# Patient Record
Sex: Female | Born: 1950 | Race: White | Hispanic: No | Marital: Single | Smoking: Never smoker
Health system: Southern US, Community
[De-identification: ages and names within clinical notes are randomized; demographics above are authoritative.]

## PROBLEM LIST (undated history)

## (undated) DIAGNOSIS — K589 Irritable bowel syndrome without diarrhea: Secondary | ICD-10-CM

## (undated) DIAGNOSIS — Z972 Presence of dental prosthetic device (complete) (partial): Secondary | ICD-10-CM

## (undated) DIAGNOSIS — I639 Cerebral infarction, unspecified: Secondary | ICD-10-CM

## (undated) DIAGNOSIS — K3184 Gastroparesis: Secondary | ICD-10-CM

## (undated) DIAGNOSIS — M1990 Unspecified osteoarthritis, unspecified site: Secondary | ICD-10-CM

## (undated) DIAGNOSIS — Z993 Dependence on wheelchair: Secondary | ICD-10-CM

## (undated) DIAGNOSIS — R202 Paresthesia of skin: Secondary | ICD-10-CM

## (undated) DIAGNOSIS — Z6841 Body Mass Index (BMI) 40.0 and over, adult: Secondary | ICD-10-CM

## (undated) DIAGNOSIS — J45909 Unspecified asthma, uncomplicated: Secondary | ICD-10-CM

## (undated) DIAGNOSIS — E0821 Diabetes mellitus due to underlying condition with diabetic nephropathy: Secondary | ICD-10-CM

## (undated) DIAGNOSIS — G4730 Sleep apnea, unspecified: Secondary | ICD-10-CM

## (undated) DIAGNOSIS — S8290XA Unspecified fracture of unspecified lower leg, initial encounter for closed fracture: Secondary | ICD-10-CM

## (undated) DIAGNOSIS — R200 Anesthesia of skin: Secondary | ICD-10-CM

## (undated) DIAGNOSIS — Z9689 Presence of other specified functional implants: Secondary | ICD-10-CM

## (undated) DIAGNOSIS — I10 Essential (primary) hypertension: Secondary | ICD-10-CM

## (undated) DIAGNOSIS — R232 Flushing: Secondary | ICD-10-CM

## (undated) DIAGNOSIS — I219 Acute myocardial infarction, unspecified: Secondary | ICD-10-CM

## (undated) DIAGNOSIS — C4490 Unspecified malignant neoplasm of skin, unspecified: Secondary | ICD-10-CM

## (undated) DIAGNOSIS — N182 Chronic kidney disease, stage 2 (mild): Secondary | ICD-10-CM

## (undated) DIAGNOSIS — M545 Low back pain, unspecified: Secondary | ICD-10-CM

## (undated) DIAGNOSIS — N23 Unspecified renal colic: Secondary | ICD-10-CM

## (undated) DIAGNOSIS — M5430 Sciatica, unspecified side: Secondary | ICD-10-CM

## (undated) DIAGNOSIS — M359 Systemic involvement of connective tissue, unspecified: Secondary | ICD-10-CM

## (undated) DIAGNOSIS — E785 Hyperlipidemia, unspecified: Secondary | ICD-10-CM

## (undated) DIAGNOSIS — I2510 Atherosclerotic heart disease of native coronary artery without angina pectoris: Secondary | ICD-10-CM

## (undated) DIAGNOSIS — R600 Localized edema: Secondary | ICD-10-CM

## (undated) DIAGNOSIS — E039 Hypothyroidism, unspecified: Secondary | ICD-10-CM

## (undated) DIAGNOSIS — N200 Calculus of kidney: Secondary | ICD-10-CM

## (undated) DIAGNOSIS — G8929 Other chronic pain: Secondary | ICD-10-CM

## (undated) DIAGNOSIS — N289 Disorder of kidney and ureter, unspecified: Secondary | ICD-10-CM

## (undated) DIAGNOSIS — T8859XA Other complications of anesthesia, initial encounter: Secondary | ICD-10-CM

## (undated) DIAGNOSIS — D649 Anemia, unspecified: Secondary | ICD-10-CM

## (undated) DIAGNOSIS — K7460 Unspecified cirrhosis of liver: Secondary | ICD-10-CM

## (undated) DIAGNOSIS — R110 Nausea: Secondary | ICD-10-CM

## (undated) DIAGNOSIS — N270 Small kidney, unilateral: Secondary | ICD-10-CM

## (undated) DIAGNOSIS — I739 Peripheral vascular disease, unspecified: Secondary | ICD-10-CM

## (undated) DIAGNOSIS — C801 Malignant (primary) neoplasm, unspecified: Secondary | ICD-10-CM

## (undated) DIAGNOSIS — E133299 Other specified diabetes mellitus with mild nonproliferative diabetic retinopathy without macular edema, unspecified eye: Secondary | ICD-10-CM

## (undated) DIAGNOSIS — Z87442 Personal history of urinary calculi: Secondary | ICD-10-CM

## (undated) DIAGNOSIS — T753XXA Motion sickness, initial encounter: Secondary | ICD-10-CM

## (undated) DIAGNOSIS — R310 Gross hematuria: Secondary | ICD-10-CM

## (undated) DIAGNOSIS — E119 Type 2 diabetes mellitus without complications: Secondary | ICD-10-CM

## (undated) DIAGNOSIS — R1012 Left upper quadrant pain: Secondary | ICD-10-CM

## (undated) HISTORY — DX: Nausea: R11.0

## (undated) HISTORY — DX: Calculus of kidney: N20.0

## (undated) HISTORY — PX: CHOLECYSTECTOMY: SHX55

## (undated) HISTORY — DX: Flushing: R23.2

## (undated) HISTORY — PX: ABDOMINAL HYSTERECTOMY: SHX81

## (undated) HISTORY — DX: Gross hematuria: R31.0

## (undated) HISTORY — DX: Cerebral infarction, unspecified: I63.9

## (undated) HISTORY — DX: Unspecified renal colic: N23

## (undated) HISTORY — DX: Left upper quadrant pain: R10.12

## (undated) HISTORY — PX: HERNIA REPAIR: SHX51

## (undated) HISTORY — DX: Unspecified malignant neoplasm of skin, unspecified: C44.90

## (undated) HISTORY — PX: DILATION AND CURETTAGE OF UTERUS: SHX78

## (undated) HISTORY — PX: TOE AMPUTATION: SHX809

## (undated) HISTORY — PX: TONSILLECTOMY: SUR1361

---

## 1984-12-27 HISTORY — PX: EYE SURGERY: SHX253

## 1999-04-27 HISTORY — PX: BACK SURGERY: SHX140

## 2001-05-04 HISTORY — PX: COLONOSCOPY: SHX174

## 2003-03-22 ENCOUNTER — Encounter: Payer: Self-pay | Admitting: Neurosurgery

## 2003-03-26 ENCOUNTER — Encounter: Payer: Self-pay | Admitting: Neurosurgery

## 2003-03-26 ENCOUNTER — Inpatient Hospital Stay (HOSPITAL_COMMUNITY): Admission: RE | Admit: 2003-03-26 | Discharge: 2003-03-27 | Payer: Self-pay | Admitting: Neurosurgery

## 2003-05-21 ENCOUNTER — Encounter: Admission: RE | Admit: 2003-05-21 | Discharge: 2003-05-21 | Payer: Self-pay | Admitting: Neurosurgery

## 2003-05-21 ENCOUNTER — Encounter: Payer: Self-pay | Admitting: Neurosurgery

## 2004-07-27 ENCOUNTER — Other Ambulatory Visit: Payer: Self-pay

## 2004-10-01 ENCOUNTER — Ambulatory Visit: Payer: Self-pay | Admitting: Anesthesiology

## 2004-11-10 ENCOUNTER — Ambulatory Visit: Payer: Self-pay | Admitting: Anesthesiology

## 2004-12-15 ENCOUNTER — Emergency Department: Payer: Self-pay | Admitting: Emergency Medicine

## 2005-01-07 ENCOUNTER — Ambulatory Visit: Payer: Self-pay | Admitting: Unknown Physician Specialty

## 2005-01-29 ENCOUNTER — Ambulatory Visit: Payer: Self-pay | Admitting: Internal Medicine

## 2005-02-05 ENCOUNTER — Ambulatory Visit: Payer: Self-pay | Admitting: Oncology

## 2005-02-24 ENCOUNTER — Ambulatory Visit: Payer: Self-pay | Admitting: Oncology

## 2005-03-03 ENCOUNTER — Ambulatory Visit: Payer: Self-pay | Admitting: Unknown Physician Specialty

## 2005-03-04 ENCOUNTER — Ambulatory Visit: Payer: Self-pay | Admitting: Unknown Physician Specialty

## 2005-06-02 ENCOUNTER — Ambulatory Visit: Payer: Self-pay | Admitting: Anesthesiology

## 2005-07-13 ENCOUNTER — Ambulatory Visit: Payer: Self-pay | Admitting: Anesthesiology

## 2005-08-10 ENCOUNTER — Ambulatory Visit: Payer: Self-pay | Admitting: Anesthesiology

## 2005-09-16 ENCOUNTER — Ambulatory Visit: Payer: Self-pay | Admitting: Internal Medicine

## 2005-09-16 ENCOUNTER — Ambulatory Visit: Payer: Self-pay | Admitting: Anesthesiology

## 2005-10-18 ENCOUNTER — Ambulatory Visit: Payer: Self-pay | Admitting: Anesthesiology

## 2005-11-08 ENCOUNTER — Ambulatory Visit: Payer: Self-pay | Admitting: Anesthesiology

## 2005-12-27 DIAGNOSIS — C801 Malignant (primary) neoplasm, unspecified: Secondary | ICD-10-CM

## 2005-12-27 HISTORY — DX: Malignant (primary) neoplasm, unspecified: C80.1

## 2005-12-30 ENCOUNTER — Ambulatory Visit: Payer: Self-pay | Admitting: Oncology

## 2006-01-12 ENCOUNTER — Ambulatory Visit: Payer: Self-pay | Admitting: Anesthesiology

## 2006-01-27 ENCOUNTER — Ambulatory Visit: Payer: Self-pay | Admitting: Oncology

## 2006-02-07 ENCOUNTER — Ambulatory Visit: Payer: Self-pay | Admitting: Anesthesiology

## 2006-03-22 ENCOUNTER — Ambulatory Visit: Payer: Self-pay | Admitting: Anesthesiology

## 2006-04-21 ENCOUNTER — Ambulatory Visit: Payer: Self-pay | Admitting: Anesthesiology

## 2006-05-30 ENCOUNTER — Ambulatory Visit: Payer: Self-pay | Admitting: Otolaryngology

## 2006-05-30 ENCOUNTER — Other Ambulatory Visit: Payer: Self-pay

## 2006-06-02 ENCOUNTER — Ambulatory Visit: Payer: Self-pay | Admitting: Otolaryngology

## 2006-06-08 ENCOUNTER — Ambulatory Visit: Payer: Self-pay | Admitting: Unknown Physician Specialty

## 2006-07-18 ENCOUNTER — Ambulatory Visit: Payer: Self-pay | Admitting: Orthopaedic Surgery

## 2007-05-04 ENCOUNTER — Ambulatory Visit: Payer: Self-pay | Admitting: Anesthesiology

## 2007-07-03 ENCOUNTER — Ambulatory Visit: Payer: Self-pay | Admitting: Anesthesiology

## 2007-08-16 ENCOUNTER — Ambulatory Visit: Payer: Self-pay | Admitting: Anesthesiology

## 2007-09-19 ENCOUNTER — Ambulatory Visit: Payer: Self-pay | Admitting: Anesthesiology

## 2007-09-26 ENCOUNTER — Ambulatory Visit: Payer: Self-pay | Admitting: Anesthesiology

## 2007-10-19 ENCOUNTER — Ambulatory Visit: Payer: Self-pay | Admitting: Internal Medicine

## 2007-10-26 ENCOUNTER — Ambulatory Visit: Payer: Self-pay | Admitting: Anesthesiology

## 2007-11-20 ENCOUNTER — Ambulatory Visit: Payer: Self-pay | Admitting: Anesthesiology

## 2007-11-29 ENCOUNTER — Ambulatory Visit: Payer: Self-pay | Admitting: Anesthesiology

## 2008-02-21 ENCOUNTER — Ambulatory Visit: Payer: Self-pay | Admitting: Anesthesiology

## 2008-03-08 ENCOUNTER — Ambulatory Visit: Payer: Self-pay | Admitting: Gastroenterology

## 2008-03-14 ENCOUNTER — Ambulatory Visit: Payer: Self-pay | Admitting: Anesthesiology

## 2008-04-03 ENCOUNTER — Ambulatory Visit: Payer: Self-pay | Admitting: Anesthesiology

## 2008-05-06 ENCOUNTER — Ambulatory Visit: Payer: Self-pay | Admitting: Anesthesiology

## 2008-06-05 ENCOUNTER — Ambulatory Visit: Payer: Self-pay | Admitting: Anesthesiology

## 2008-06-13 ENCOUNTER — Ambulatory Visit: Payer: Self-pay | Admitting: Internal Medicine

## 2008-07-01 ENCOUNTER — Ambulatory Visit: Payer: Self-pay | Admitting: Anesthesiology

## 2008-07-04 ENCOUNTER — Ambulatory Visit: Payer: Self-pay | Admitting: Gastroenterology

## 2008-07-09 ENCOUNTER — Ambulatory Visit: Payer: Self-pay | Admitting: Urology

## 2008-07-11 ENCOUNTER — Other Ambulatory Visit: Payer: Self-pay

## 2008-07-12 ENCOUNTER — Observation Stay: Payer: Self-pay | Admitting: Internal Medicine

## 2008-07-29 ENCOUNTER — Ambulatory Visit: Payer: Self-pay | Admitting: Anesthesiology

## 2008-08-29 ENCOUNTER — Ambulatory Visit: Payer: Self-pay | Admitting: Anesthesiology

## 2008-11-05 ENCOUNTER — Ambulatory Visit: Payer: Self-pay | Admitting: Anesthesiology

## 2008-11-25 ENCOUNTER — Ambulatory Visit: Payer: Self-pay | Admitting: Urology

## 2008-11-27 ENCOUNTER — Ambulatory Visit: Payer: Self-pay | Admitting: Anesthesiology

## 2008-12-23 ENCOUNTER — Ambulatory Visit: Payer: Self-pay | Admitting: Urology

## 2009-01-02 ENCOUNTER — Ambulatory Visit: Payer: Self-pay | Admitting: Urology

## 2009-01-13 ENCOUNTER — Ambulatory Visit: Payer: Self-pay | Admitting: Urology

## 2009-04-10 ENCOUNTER — Ambulatory Visit: Payer: Self-pay | Admitting: Urology

## 2009-04-24 ENCOUNTER — Ambulatory Visit: Payer: Self-pay | Admitting: Internal Medicine

## 2009-05-23 ENCOUNTER — Inpatient Hospital Stay (HOSPITAL_COMMUNITY): Admission: RE | Admit: 2009-05-23 | Discharge: 2009-05-26 | Payer: Self-pay | Admitting: Neurosurgery

## 2009-06-04 ENCOUNTER — Ambulatory Visit: Payer: Self-pay | Admitting: Neurosurgery

## 2009-07-02 ENCOUNTER — Emergency Department: Payer: Self-pay | Admitting: Emergency Medicine

## 2009-07-03 ENCOUNTER — Ambulatory Visit (HOSPITAL_COMMUNITY): Admission: RE | Admit: 2009-07-03 | Discharge: 2009-07-05 | Payer: Self-pay | Admitting: Neurological Surgery

## 2009-08-26 ENCOUNTER — Ambulatory Visit: Payer: Self-pay | Admitting: Neurosurgery

## 2009-09-04 ENCOUNTER — Ambulatory Visit: Payer: Self-pay | Admitting: Internal Medicine

## 2009-09-12 ENCOUNTER — Encounter: Payer: Self-pay | Admitting: Neurosurgery

## 2009-09-26 ENCOUNTER — Ambulatory Visit: Payer: Self-pay | Admitting: Oncology

## 2009-09-26 ENCOUNTER — Encounter: Payer: Self-pay | Admitting: Neurosurgery

## 2009-10-15 ENCOUNTER — Ambulatory Visit: Payer: Self-pay | Admitting: Oncology

## 2009-10-27 ENCOUNTER — Ambulatory Visit: Payer: Self-pay | Admitting: Oncology

## 2009-10-28 ENCOUNTER — Encounter: Payer: Self-pay | Admitting: Neurosurgery

## 2009-10-29 ENCOUNTER — Ambulatory Visit: Payer: Self-pay | Admitting: Urology

## 2009-11-12 ENCOUNTER — Ambulatory Visit: Payer: Self-pay | Admitting: Oncology

## 2009-11-26 ENCOUNTER — Ambulatory Visit: Payer: Self-pay | Admitting: Oncology

## 2009-11-27 ENCOUNTER — Ambulatory Visit: Payer: Self-pay | Admitting: Internal Medicine

## 2009-12-24 ENCOUNTER — Ambulatory Visit: Payer: Self-pay | Admitting: Oncology

## 2009-12-26 ENCOUNTER — Emergency Department: Payer: Self-pay | Admitting: Unknown Physician Specialty

## 2009-12-27 ENCOUNTER — Ambulatory Visit: Payer: Self-pay | Admitting: Oncology

## 2010-01-27 ENCOUNTER — Ambulatory Visit: Payer: Self-pay | Admitting: Oncology

## 2010-02-11 ENCOUNTER — Ambulatory Visit: Payer: Self-pay | Admitting: Oncology

## 2010-02-24 ENCOUNTER — Ambulatory Visit: Payer: Self-pay | Admitting: Oncology

## 2010-06-11 ENCOUNTER — Ambulatory Visit: Payer: Self-pay | Admitting: Urology

## 2010-06-24 ENCOUNTER — Ambulatory Visit: Payer: Self-pay | Admitting: Urology

## 2010-12-04 ENCOUNTER — Ambulatory Visit: Payer: Self-pay | Admitting: Urology

## 2010-12-10 ENCOUNTER — Ambulatory Visit: Payer: Self-pay | Admitting: Internal Medicine

## 2011-02-01 ENCOUNTER — Ambulatory Visit: Payer: Self-pay | Admitting: Anesthesiology

## 2011-02-02 ENCOUNTER — Ambulatory Visit: Payer: Self-pay | Admitting: Pain Medicine

## 2011-02-17 ENCOUNTER — Ambulatory Visit: Payer: Self-pay | Admitting: Pain Medicine

## 2011-03-14 ENCOUNTER — Ambulatory Visit: Payer: Self-pay | Admitting: Otolaryngology

## 2011-04-04 LAB — COMPREHENSIVE METABOLIC PANEL
Albumin: 3.6 g/dL (ref 3.5–5.2)
BUN: 9 mg/dL (ref 6–23)
Chloride: 100 mEq/L (ref 96–112)
Creatinine, Ser: 0.73 mg/dL (ref 0.4–1.2)
GFR calc non Af Amer: >60 mL/min (ref >60)
Total Bilirubin: 0.5 mg/dL (ref 0.3–1.2)

## 2011-04-04 LAB — GLUCOSE, CAPILLARY
Glucose-Capillary: 125 mg/dL — ABNORMAL HIGH (ref 70–99)
Glucose-Capillary: 179 mg/dL — ABNORMAL HIGH (ref 70–99)
Glucose-Capillary: 184 mg/dL — ABNORMAL HIGH (ref 70–99)

## 2011-04-04 LAB — URINALYSIS, ROUTINE W REFLEX MICROSCOPIC
Bilirubin Urine: NEGATIVE
Hgb urine dipstick: NEGATIVE
Ketones, ur: NEGATIVE mg/dL
Protein, ur: NEGATIVE mg/dL
Urobilinogen, UA: 0.2 mg/dL (ref 0.0–1.0)

## 2011-04-04 LAB — CBC
HCT: 32.8 % — ABNORMAL LOW (ref 36.0–46.0)
Hemoglobin: 10.6 g/dL — ABNORMAL LOW (ref 12.0–15.0)
MCHC: 32.5 g/dL (ref 30.0–36.0)
MCV: 79.8 fL (ref 78.0–100.0)
Platelets: 353 10*3/uL (ref 150–400)
RBC: 4.1 MIL/uL (ref 3.87–5.11)
RDW: 15 % (ref 11.5–15.5)
WBC: 7.4 10*3/uL (ref 4.0–10.5)

## 2011-04-04 LAB — WOUND CULTURE: Culture: NO GROWTH

## 2011-04-04 LAB — DIFFERENTIAL
Basophils Absolute: 0 10*3/uL (ref 0.0–0.1)
Basophils Relative: 0 % (ref 0–1)
Monocytes Relative: 6 % (ref 3–12)
Neutro Abs: 3.6 10*3/uL (ref 1.7–7.7)
Neutrophils Relative %: 49 % (ref 43–77)

## 2011-04-04 LAB — PROTIME-INR
INR: 1 (ref 0.00–1.49)
Prothrombin Time: 13 seconds (ref 11.6–15.2)

## 2011-04-04 LAB — C-REACTIVE PROTEIN: CRP: 1.1 mg/dL — ABNORMAL HIGH (ref <0.6)

## 2011-04-04 LAB — SEDIMENTATION RATE: Sed Rate: 27 mm/hr — ABNORMAL HIGH (ref 0–22)

## 2011-04-04 LAB — ANAEROBIC CULTURE

## 2011-04-04 LAB — URINE MICROSCOPIC-ADD ON

## 2011-04-06 LAB — CBC
HCT: 35.7 % — ABNORMAL LOW (ref 36.0–46.0)
Hemoglobin: 11.9 g/dL — ABNORMAL LOW (ref 12.0–15.0)
MCHC: 33.3 g/dL (ref 30.0–36.0)
MCV: 83.6 fL (ref 78.0–100.0)
RBC: 4.27 MIL/uL (ref 3.87–5.11)
RDW: 15 % (ref 11.5–15.5)

## 2011-04-06 LAB — GLUCOSE, CAPILLARY
Glucose-Capillary: 188 mg/dL — ABNORMAL HIGH (ref 70–99)
Glucose-Capillary: 226 mg/dL — ABNORMAL HIGH (ref 70–99)
Glucose-Capillary: 256 mg/dL — ABNORMAL HIGH (ref 70–99)
Glucose-Capillary: 271 mg/dL — ABNORMAL HIGH (ref 70–99)
Glucose-Capillary: 271 mg/dL — ABNORMAL HIGH (ref 70–99)

## 2011-04-06 LAB — DIFFERENTIAL
Basophils Absolute: 0.1 10*3/uL (ref 0.0–0.1)
Basophils Relative: 1 % (ref 0–1)
Eosinophils Absolute: 0.7 10*3/uL (ref 0.0–0.7)
Eosinophils Relative: 7 % — ABNORMAL HIGH (ref 0–5)
Monocytes Absolute: 0.6 10*3/uL (ref 0.1–1.0)
Monocytes Relative: 6 % (ref 3–12)
Neutro Abs: 4.9 10*3/uL (ref 1.7–7.7)

## 2011-04-06 LAB — BASIC METABOLIC PANEL
CO2: 27 mEq/L (ref 19–32)
Calcium: 9.8 mg/dL (ref 8.4–10.5)
Chloride: 104 mEq/L (ref 96–112)
GFR calc Af Amer: >60 mL/min (ref >60)
Glucose, Bld: 204 mg/dL — ABNORMAL HIGH (ref 70–99)
Sodium: 141 mEq/L (ref 135–145)

## 2011-04-06 LAB — TYPE AND SCREEN

## 2011-04-13 ENCOUNTER — Ambulatory Visit: Payer: Self-pay | Admitting: Pain Medicine

## 2011-04-15 ENCOUNTER — Ambulatory Visit: Payer: Self-pay | Admitting: Otolaryngology

## 2011-05-11 NOTE — Op Note (Signed)
NAMERICCA, MELGAREJO              ACCOUNT NO.:  [id number]   MEDICAL RECORD NO.:  [id number]          PATIENT TYPE:  OIB   LOCATION:  3534                         FACILITY:  [facility]   PHYSICIAN:  Tia Alert, MD     DATE OF BIRTH:  [birth year]   DATE OF PROCEDURE:  07/03/2009  DATE OF DISCHARGE:                               OPERATIVE REPORT   PREOPERATIVE DIAGNOSIS:  Lumbar wound infection with deep lumbar fluid  collection.   POSTOPERATIVE DIAGNOSIS:  Lumbar wound infection with deep lumbar fluid  collection with deep lumbar wound seroma.   PROCEDURE:  Irrigation and debridement of lumbar wound.   SURGEON:  Tia Alert, MD   ANESTHESIA:  General endotracheal.   COMPLICATIONS:  None apparent.   INDICATIONS FOR PROCEDURE:  Ms. Dedeaux is a 60 year old female who  underwent a lumbar fusion in late May who presented with 7-day of  drainage from her wound.  She had been on Keflex.  She went to the  emergency department last night where she had continued drainage from  the wound.  They started on doxycycline and they did an MRI, which  showed a deep wound fluid collection.  This was felt most likely to be  seromatous fluid.  However, we thought it was best to open the wound and  culture this fluid and decompress the canal as it was causing  significant mass affect on the thecal sac and she was starting to have  tingling in the legs and some complaints of urinary incontinence.  She  understood the risks, benefits, and expected outcome and wished to  proceed.   DESCRIPTION OF PROCEDURE:  This patient was taken to the operating room  and after induction of adequate generalized endotracheal anesthesia, she  was rolled in a prone position on the Wilson frame and all pressure  points were padded.  Her lumbar region was prepped with DuraPrep and  then draped in the usual sterile fashion. A 10 mL of local anesthesia  was injected and her incision was opened.  The small dehisced part  of  the wound was ellipsed out and dissected down to the tissues.  I found  the first superficial seromatous fluid collection and irrigated this and  cleaned this.  I then opened the fascia, dissected down through the  muscular tissues until I found the epidural space with release of a  greenish-colored serosanguineous fluid.  No evidence of infection was  found.  There was no exudate.  No pus.  I irrigated with saline solution  containing bacitracin throughout all bleeding points and then placed a  medium Hemovac drain in the epidural space and one in the subcutaneous  space as I closed.  I closed the muscle and the fascia with 0 Vicryl,  closed the subcutaneous and subcuticular tissue with 2-0 and 3-0 Vicryl,  and  closed the skin with benzoin and Steri-Strips.  The drapes were removed.  A sterile dressing was applied.  The patient was awakened from general  anesthesia and transferred to the recovery room in stable condition.  At  the end of  the procedure, all sponge, needle, and instrument counts were  correct.      Tia Alert, MD  Electronically Signed     DSJ/MEDQ  D:  07/03/2009  T:  07/04/2009  Job:  [id number]

## 2011-05-11 NOTE — Op Note (Signed)
Brittney [name], Brittney [name]              ACCOUNT NO.:  [id number]   MEDICAL RECORD NO.:  [id number]          PATIENT TYPE:  INP   LOCATION:  3014                         FACILITY:  [facility]   PHYSICIAN:  Kathaleen Maser. Pool, M.D.    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   OPERATION:  Bilateral L4-5 decompressive laminectomy with bilateral L4  and L5 decompressive foraminotomies, more than would be required for  simple interbody fusion alone.  L4-5 posterior lumbar interbody fusion  utilizing Tangent interbody allograft wedge, Telamon interbody PEEK  cage, and local autografting.  L4-5 posterolateral arthrodesis utilizing  nonsegmental pedicle fixation and local autografting.   SURGEON:  Kathaleen Maser. Pool, MD   ASSISTANT:  Dr. Yetta Barre.   ANESTHESIA:  General endotracheal.   INDICATIONS:  Brittney [name] is a 60 year old female with history of back  and bilateral lower extremity pain, right greater than left, failed all  conservative management.  Workup demonstrates evidence of degenerative  spondylolisthesis at L4-5 with marked stenosis.  The patient has failed  conservative management.  She presents now for L4-5 decompression and  fusion with instrumentation with hopes of improvement in her symptoms.   OPERATIVE NOTE:  The patient was brought to the operating room and  placed on operating table in supine position.  After an adequate level  of anesthesia was achieved, the patient was placed prone on the Wilson  frame.  Appropriately padded the patient's lumbar region, prepped and  draped sterilely.  A 10 blade was used to make a linear skin incision  overlying the L4-5  interspace.  This was carried down sharply in the  midline.  A subperiosteal dissection was then performed exposing the  lamina and facet joints of L3, 4, and 5.  Deep self-retaining retractor  was placed.  Intraoperative fluoroscopy was used and levels were  confirmed.  Transverse processes of L4 and L5 were also dissected free.  Decompressive laminectomy at L4 and L5 were then performed using Leksell  rongeurs, Kerrison rongeurs, and high-speed drill to remove the entire  lamina of L4, inferior facet of L4 bilaterally, superior facet of L5  bilaterally, and superior aspect of the L5 lamina.  Ligamentum flavum  was elevated and resected in piecemeal fashion.  Underlying thecal sac  and exiting L4 and L5 nerve roots were identified.  Wide decompressive  foraminotomies were performed along the course exiting nerve roots  bilaterally.  Epidural venous plexus was coagulated and cut.  Bilateral  diskectomies were then performed.  Disk spaces were then prepared for an  interbody fusion.  Disk space was distracted up to 10 mm and 10-mm  distractor was left in the patient's right side.  Thecal sac and nerve  roots were protected on the left side.  Disk space was then reamed and  cut with 10-mm Tangent instrument.  Soft tissues were removed from the  interspace.  A 10 x 26 mm Telamon cage packed with morcellized autograft  then packed into place and recessed approximately 2 mm from posterior  cortical margin of L4.  Distractors were removed from the patient's left  side.  Thecal sac and nerve roots were once again protected on the left  side.  Disk space once again reamed and cut with 10 mm Tangent  instrument.  Soft tissue was removed from the interspace.  Disk space  was further curettaged.  Morcellized autograft was then packed in the  interspace.  A 10 x 26 mm Tangent wedge was then packed into interspace  and recessed approximately 2 mm from posterior cortical margin of L4.  Pedicles of L4  and L5 were then identified using surface landmarks and  intraoperative fluoroscopy.  Superficial bone overlying the pedicle was  then removed with the high-speed drill.  Each pedicle was then probed  using pedicle awl.  Pedicle awl track was then tapped with 5.25-mm screw  tap.  Each screw tap hole was then probed and found to be solid with  bone.  The 6.75 x 45 mm radius screws were placed bilaterally at L4.  The 6.75 x 40 mm screws were placed bilaterally at L5.  Transverse  processes of L4 and L5 were then decorticated with high-speed drill.  Morcellized autograft was packed posterolaterally for later fusion.  Short-segment titanium rod was then placed over the screw heads at L4  and L5.  Locking caps were then placed over screw heads.  Locking caps  were then engaged with construct under compression.  Final images  revealed good position of the bone grafts, hardware at proper operative  level with normal alignment of spine.  The wound was then irrigated with  antibiotic solution.  Hemostasis was assured with bipolar  electrocautery.  Gelfoam was placed topically.  Hemostasis was found to  be good.  Medium Hemovac drain was left in the interspace.  The wound  was then closed in layers with Vicryl sutures.  Steri-Strips and sterile  dressing were applied.  There were no complications.  The patient  tolerated the procedure well and she returned to recovery room  postoperatively.           ______________________________  Kathaleen Maser Pool, M.D.     HAP/MEDQ  D:  05/23/2009  T:  05/24/2009  Job:  [id number]

## 2011-05-12 ENCOUNTER — Ambulatory Visit: Payer: Self-pay | Admitting: Pain Medicine

## 2011-05-12 ENCOUNTER — Other Ambulatory Visit: Payer: Self-pay | Admitting: Pain Medicine

## 2011-05-14 NOTE — Discharge Summary (Signed)
Brittney [name], Brittney [name]              ACCOUNT NO.:  [id number]   MEDICAL RECORD NO.:  [id number]          PATIENT TYPE:  INP   LOCATION:  3014                         FACILITY:  [facility]   PHYSICIAN:  Kathaleen Maser. Pool, M.D.    DATE OF BIRTH:  1951-01-15   DATE OF ADMISSION:  05/23/2009  DATE OF DISCHARGE:  05/26/2009                               DISCHARGE SUMMARY   FINAL DIAGNOSIS:  L4-5 degenerative spondylolisthesis with stenosis.   HISTORY OF PRESENT ILLNESS:  Brittney [name] is a 60 year old female with  history of symptomatic lumbar stenosis.  Workup demonstrates evidence of  degenerative spondylolisthesis with resultant stenosis.  The patient  presents now for decompression and fusion in hopes of improving her  symptoms.   HOSPITAL COURSE:  The patient was taken to the operating room where an  uncomplicated L4-5 decompression and fusion with instrumentation was  performed.  Postop the patient awakened with improved lower extremity  function.  Back pain was moderately gradually improved.  She is  mobilized with the aid of physical and occupational therapy.  At the  time of discharge, she is tolerating her diet.  She is ambulating  without assistance and her wound is healing well.  She will be  discharged home.  She will follow up in my office and be seen in  followup in 1 week.           ______________________________  Kathaleen Maser. Pool, M.D.     HAP/MEDQ  D:  07/22/2009  T:  07/23/2009  Job:  [id number]

## 2011-05-14 NOTE — Op Note (Signed)
Brittney [name], Brittney [name]                          ACCOUNT NO.:  [id number]   MEDICAL RECORD NO.:  [id number]                   PATIENT TYPE:  INP   LOCATION:  2870                                 FACILITY:  [facility]   PHYSICIAN:  Kathaleen Maser. Pool, M.D.                 DATE OF BIRTH:  1951-01-11   DATE OF PROCEDURE:  03/26/2003  DATE OF DISCHARGE:                                 OPERATIVE REPORT   PREOPERATIVE DIAGNOSIS:  Left C5-6 herniated nucleus pulposus with  radiculopathy.   POSTOPERATIVE DIAGNOSIS:  Left C5-6 herniated nucleus pulposus with  radiculopathy.   PROCEDURE:  C5-6 anterior cervical diskectomy and fusion with allograft  anterior plating.   SURGEON:  Kathaleen Maser. Pool, M.D.   ASSISTANT:  Donalee Citrin, M.D.   ANESTHESIA:  General endotracheal.   INDICATIONS FOR PROCEDURE:  The patient is a 60 year old female with a  history of neck and left upper extremity pain, paresthesias, and weakness  with a left-sided C6 radiculopathy which has failed conservative management.  MRI scan demonstrates evidence of a large leftward C5-6 disk herniation with  compression of the exiting left-sided C6 nerve root.  We discussed options  of management including the possibility of undergoing a C5-6 anterior  cervical diskectomy with fusion with allograft anterior plating. The patient  is aware of the risks and benefits and wishes to proceed.   DESCRIPTION OF PROCEDURE:  The patient was placed on the operating table in  the supine position. After an adequate level of anesthesia was achieved, the  patient was positioned supine with her neck slightly extended and held in  place with Halter traction.  The patient's anterior cervical region is  prepped and draped sterilely. A 10 blade was used to make a linear skin  incision overlying the C5-6 interspace. This was carried down sharply to the  platysma.  The platysma was then divided vertically.  Dissection then  proceeded along the medial border of  the sternocleidomastoid muscle and  carotid sheath. Trachea and esophagus were mobilized and retracted toward  the left. Prevertebral fascia stripped off the anterior spinal column. The  longus coli muscles were then elevated bilaterally using electrocautery.  Deep self-retaining retractors were placed.  Intraoperative fluoroscopy was  used and the levels were confirmed. The disk space was then incised with a  15 blade and a wide disk space cleanout was then achieved using rongeur  support and backward curets, Kerrison rongeurs, and highspeed drill.  All  elements of the disk were completely removed down to the level of the  posterior annulus. The microscope was brought into the field and used for  microdissection of the remaining disk.  Remaining aspects of osteophytes and  annulus were removed using the highspeed drill down to the level of the  posterior longitudinal ligament. The posterior longitudinal ligament was  then elevated and resected in a piecemeal fashion using Kerrison rongeurs.  The underlying thecal sac was identified.  Wide central decompression was  then performed by using Kerrison rongeurs to undercut the bodies of C5 and  C6. All elements of the disk herniated were resected along the way.  Decompression then proceeded out into the left-sided C6 foramen. C6 nerve  root was identified proximally and a wide anterior foraminotomy was  performed along the course of the exiting nerve root. Decompression then  proceeded into the right-sided C6 foramen.  Once again the C6 nerve root was  identified and widely decompressed anteriorly.  Blunt probe passed easily  both superiorly and inferiorly down each foramen. There was no evidence of  residual stenosis. The wound was then irrigated with antibiotic solution.  Gelfoam was placed topically and hemostasis was found to be good.  An 8 mm  patellar wedge allograft was then impacted in place approximately 1 mm to  the anterior cortical  surface. The 25 mm Atlantis anterior cervical plate  was then placed over the C5 and C6 levels and then attached under  fluoroscopic guidance with 13 mm variable angle screws, two each at C5 and  C6.  All four screws were then tightened and found to be solid within bone.  The locking screws were engaged at both levels. Final images revealed good  position of bone graft and hardware. Proper operative level with normal  alignment of the spine. Retraction system was removed. Hemostasis was  ensured with bipolar electrocautery.  The wound was then irrigated one final  time and then closed in a typical fashion.  Steri-Strips and a sterile  dressing were applied. There were no apparent complications.  The patient  tolerated the procedure well and she returned to the recovery room  postoperatively.                                               Henry A. Pool, M.D.    HAP/MEDQ  D:  03/26/2003  T:  03/26/2003  Job:  [id number]

## 2011-12-08 ENCOUNTER — Ambulatory Visit: Payer: Self-pay | Admitting: Urology

## 2012-03-01 ENCOUNTER — Ambulatory Visit: Payer: Self-pay | Admitting: Internal Medicine

## 2012-03-30 ENCOUNTER — Ambulatory Visit: Payer: Self-pay

## 2012-05-29 ENCOUNTER — Ambulatory Visit: Payer: Self-pay

## 2012-06-26 ENCOUNTER — Ambulatory Visit: Payer: Self-pay

## 2012-07-11 ENCOUNTER — Ambulatory Visit: Payer: Self-pay | Admitting: Internal Medicine

## 2012-08-03 ENCOUNTER — Ambulatory Visit: Payer: Self-pay

## 2012-08-27 ENCOUNTER — Ambulatory Visit: Payer: Self-pay

## 2012-09-26 ENCOUNTER — Ambulatory Visit: Payer: Self-pay

## 2012-12-04 ENCOUNTER — Ambulatory Visit: Payer: Self-pay

## 2012-12-11 DIAGNOSIS — R1012 Left upper quadrant pain: Secondary | ICD-10-CM

## 2012-12-11 DIAGNOSIS — M5430 Sciatica, unspecified side: Secondary | ICD-10-CM | POA: Insufficient documentation

## 2012-12-11 DIAGNOSIS — N270 Small kidney, unilateral: Secondary | ICD-10-CM | POA: Insufficient documentation

## 2012-12-11 DIAGNOSIS — R232 Flushing: Secondary | ICD-10-CM

## 2012-12-11 DIAGNOSIS — R978 Other abnormal tumor markers: Secondary | ICD-10-CM | POA: Insufficient documentation

## 2012-12-11 DIAGNOSIS — N23 Unspecified renal colic: Secondary | ICD-10-CM

## 2012-12-11 DIAGNOSIS — R110 Nausea: Secondary | ICD-10-CM

## 2012-12-11 DIAGNOSIS — N200 Calculus of kidney: Secondary | ICD-10-CM

## 2012-12-11 DIAGNOSIS — N182 Chronic kidney disease, stage 2 (mild): Secondary | ICD-10-CM | POA: Insufficient documentation

## 2012-12-11 DIAGNOSIS — R310 Gross hematuria: Secondary | ICD-10-CM | POA: Insufficient documentation

## 2012-12-11 HISTORY — DX: Unspecified renal colic: N23

## 2012-12-11 HISTORY — DX: Gross hematuria: R31.0

## 2012-12-11 HISTORY — DX: Calculus of kidney: N20.0

## 2012-12-11 HISTORY — DX: Sciatica, unspecified side: M54.30

## 2012-12-11 HISTORY — DX: Left upper quadrant pain: R10.12

## 2012-12-11 HISTORY — DX: Flushing: R23.2

## 2012-12-11 HISTORY — DX: Nausea: R11.0

## 2012-12-27 ENCOUNTER — Ambulatory Visit: Payer: Self-pay

## 2012-12-27 DIAGNOSIS — S8290XA Unspecified fracture of unspecified lower leg, initial encounter for closed fracture: Secondary | ICD-10-CM

## 2012-12-27 HISTORY — DX: Unspecified fracture of unspecified lower leg, initial encounter for closed fracture: S82.90XA

## 2013-03-05 ENCOUNTER — Ambulatory Visit: Payer: Self-pay | Admitting: Internal Medicine

## 2013-05-22 ENCOUNTER — Ambulatory Visit: Payer: Self-pay | Admitting: General Practice

## 2013-05-22 LAB — BASIC METABOLIC PANEL
Anion Gap: 5 — ABNORMAL LOW (ref 7–16)
BUN: 16 mg/dL (ref 7–18)
Calcium, Total: 10 mg/dL (ref 8.5–10.1)
Co2: 32 mmol/L (ref 21–32)
EGFR (African American): >60
Potassium: 3.9 mmol/L (ref 3.5–5.1)
Sodium: 133 mmol/L — ABNORMAL LOW (ref 136–145)

## 2013-05-22 LAB — CBC
HGB: 12.6 g/dL (ref 12.0–16.0)
MCH: 27 pg (ref 26.0–34.0)
MCV: 82 fL (ref 80–100)
WBC: 12.8 10*3/uL — ABNORMAL HIGH (ref 3.6–11.0)

## 2013-05-22 LAB — SEDIMENTATION RATE: Erythrocyte Sed Rate: 29 mm/hr (ref 0–30)

## 2013-05-25 LAB — URINE CULTURE

## 2013-09-11 ENCOUNTER — Ambulatory Visit: Payer: Self-pay | Admitting: General Practice

## 2013-09-11 LAB — MRSA PCR SCREENING

## 2013-09-11 LAB — PROTIME-INR: Prothrombin Time: 13.8 secs (ref 11.5–14.7)

## 2013-09-24 ENCOUNTER — Inpatient Hospital Stay: Payer: Self-pay | Admitting: General Practice

## 2013-09-24 HISTORY — PX: JOINT REPLACEMENT: SHX530

## 2013-09-25 LAB — BASIC METABOLIC PANEL
BUN: 9 mg/dL (ref 7–18)
Calcium, Total: 8.6 mg/dL (ref 8.5–10.1)
EGFR (Non-African Amer.): >60
Osmolality: 276 (ref 275–301)
Sodium: 137 mmol/L (ref 136–145)

## 2013-09-25 LAB — HEMOGLOBIN: HGB: 10.9 g/dL — ABNORMAL LOW (ref 12.0–16.0)

## 2013-09-25 LAB — PATHOLOGY REPORT

## 2013-09-26 DIAGNOSIS — I219 Acute myocardial infarction, unspecified: Secondary | ICD-10-CM

## 2013-09-26 HISTORY — DX: Acute myocardial infarction, unspecified: I21.9

## 2013-09-26 LAB — BASIC METABOLIC PANEL
Calcium, Total: 8.6 mg/dL (ref 8.5–10.1)
Chloride: 102 mmol/L (ref 98–107)
Co2: 29 mmol/L (ref 21–32)
EGFR (African American): >60
Glucose: 172 mg/dL — ABNORMAL HIGH (ref 65–99)
Potassium: 3.6 mmol/L (ref 3.5–5.1)

## 2013-09-26 LAB — HEMOGLOBIN: HGB: 10.8 g/dL — ABNORMAL LOW (ref 12.0–16.0)

## 2013-12-12 ENCOUNTER — Ambulatory Visit: Payer: Self-pay | Admitting: Urology

## 2014-01-24 ENCOUNTER — Ambulatory Visit: Payer: Self-pay | Admitting: Oncology

## 2014-02-01 ENCOUNTER — Ambulatory Visit: Payer: Self-pay | Admitting: Oncology

## 2014-02-04 ENCOUNTER — Ambulatory Visit: Payer: Self-pay | Admitting: Oncology

## 2014-02-04 LAB — RETICULOCYTES
ABSOLUTE RETIC COUNT: 0.0554 10*6/uL (ref 0.019–0.186)
Reticulocyte: 1.14 % (ref 0.4–3.1)

## 2014-02-04 LAB — CBC CANCER CENTER
BASOS ABS: 0.1 x10 3/mm (ref 0.0–0.1)
BASOS PCT: 0.7 %
EOS PCT: 2.3 %
Eosinophil #: 0.3 x10 3/mm (ref 0.0–0.7)
HCT: 39 % (ref 35.0–47.0)
HGB: 12 g/dL (ref 12.0–16.0)
LYMPHS ABS: 4.6 x10 3/mm — AB (ref 1.0–3.6)
Lymphocyte %: 34.6 %
MCH: 24.7 pg — ABNORMAL LOW (ref 26.0–34.0)
MCHC: 30.9 g/dL — ABNORMAL LOW (ref 32.0–36.0)
MCV: 80 fL (ref 80–100)
MONOS PCT: 5.8 %
Monocyte #: 0.8 x10 3/mm (ref 0.2–0.9)
Neutrophil #: 7.6 x10 3/mm — ABNORMAL HIGH (ref 1.4–6.5)
Neutrophil %: 56.6 %
Platelet: 277 x10 3/mm (ref 150–440)
RBC: 4.88 10*6/uL (ref 3.80–5.20)
RDW: 15.7 % — ABNORMAL HIGH (ref 11.5–14.5)
WBC: 13.4 x10 3/mm — ABNORMAL HIGH (ref 3.6–11.0)

## 2014-02-04 LAB — FERRITIN: Ferritin (ARMC): 17 ng/mL (ref 8–388)

## 2014-02-04 LAB — IRON AND TIBC
IRON BIND. CAP.(TOTAL): 441 ug/dL (ref 250–450)
IRON: 55 ug/dL (ref 50–170)
Iron Saturation: 12 %
UNBOUND IRON-BIND. CAP.: 386 ug/dL

## 2014-02-04 LAB — FOLATE: Folic Acid: 22.5 ng/mL (ref 3.1–100.0)

## 2014-02-04 LAB — LACTATE DEHYDROGENASE: LDH: 140 U/L (ref 81–246)

## 2014-02-08 ENCOUNTER — Ambulatory Visit: Payer: Self-pay | Admitting: Gastroenterology

## 2014-02-08 HISTORY — PX: ESOPHAGOGASTRODUODENOSCOPY: SHX1529

## 2014-02-12 LAB — PATHOLOGY REPORT

## 2014-02-24 ENCOUNTER — Ambulatory Visit: Payer: Self-pay | Admitting: Oncology

## 2014-03-06 ENCOUNTER — Ambulatory Visit: Payer: Self-pay | Admitting: Internal Medicine

## 2014-04-01 ENCOUNTER — Ambulatory Visit: Payer: Self-pay | Admitting: Oncology

## 2014-04-01 LAB — CBC CANCER CENTER
BASOS ABS: 0.1 x10 3/mm (ref 0.0–0.1)
BASOS PCT: 1.2 %
Eosinophil #: 0.3 x10 3/mm (ref 0.0–0.7)
Eosinophil %: 3.2 %
HCT: 41.8 % (ref 35.0–47.0)
HGB: 13.2 g/dL (ref 12.0–16.0)
LYMPHS PCT: 33.6 %
Lymphocyte #: 3 x10 3/mm (ref 1.0–3.6)
MCH: 27 pg (ref 26.0–34.0)
MCHC: 31.6 g/dL — AB (ref 32.0–36.0)
MCV: 86 fL (ref 80–100)
Monocyte #: 0.5 x10 3/mm (ref 0.2–0.9)
Monocyte %: 5.1 %
Neutrophil #: 5.1 x10 3/mm (ref 1.4–6.5)
Neutrophil %: 56.9 %
Platelet: 217 x10 3/mm (ref 150–440)
RBC: 4.89 10*6/uL (ref 3.80–5.20)
RDW: 18.9 % — ABNORMAL HIGH (ref 11.5–14.5)
WBC: 9 x10 3/mm (ref 3.6–11.0)

## 2014-04-01 LAB — IRON AND TIBC
IRON BIND. CAP.(TOTAL): 287 ug/dL (ref 250–450)
Iron Saturation: 38 %
Iron: 109 ug/dL (ref 50–170)
UNBOUND IRON-BIND. CAP.: 178 ug/dL

## 2014-04-01 LAB — FERRITIN: Ferritin (ARMC): 209 ng/mL (ref 8–388)

## 2014-04-26 ENCOUNTER — Ambulatory Visit: Payer: Self-pay | Admitting: Oncology

## 2014-04-26 DIAGNOSIS — IMO0002 Reserved for concepts with insufficient information to code with codable children: Secondary | ICD-10-CM | POA: Insufficient documentation

## 2014-04-26 DIAGNOSIS — N200 Calculus of kidney: Secondary | ICD-10-CM | POA: Insufficient documentation

## 2014-04-26 DIAGNOSIS — G4733 Obstructive sleep apnea (adult) (pediatric): Secondary | ICD-10-CM | POA: Insufficient documentation

## 2014-04-26 DIAGNOSIS — E1165 Type 2 diabetes mellitus with hyperglycemia: Secondary | ICD-10-CM

## 2014-04-26 DIAGNOSIS — E785 Hyperlipidemia, unspecified: Secondary | ICD-10-CM | POA: Insufficient documentation

## 2014-04-26 DIAGNOSIS — E1151 Type 2 diabetes mellitus with diabetic peripheral angiopathy without gangrene: Secondary | ICD-10-CM | POA: Insufficient documentation

## 2014-04-26 HISTORY — DX: Calculus of kidney: N20.0

## 2014-05-29 ENCOUNTER — Ambulatory Visit: Payer: Self-pay

## 2014-06-13 ENCOUNTER — Ambulatory Visit: Payer: Self-pay | Admitting: Gastroenterology

## 2014-07-29 ENCOUNTER — Ambulatory Visit: Payer: Self-pay | Admitting: Oncology

## 2014-07-29 LAB — IRON AND TIBC
IRON BIND. CAP.(TOTAL): 294 ug/dL (ref 250–450)
IRON SATURATION: 26 %
IRON: 77 ug/dL (ref 50–170)
Unbound Iron-Bind.Cap.: 217 ug/dL

## 2014-07-29 LAB — CBC CANCER CENTER
BASOS ABS: 0.1 x10 3/mm (ref 0.0–0.1)
BASOS PCT: 1 %
Eosinophil #: 0.2 x10 3/mm (ref 0.0–0.7)
Eosinophil %: 2.6 %
HCT: 39.3 % (ref 35.0–47.0)
HGB: 13 g/dL (ref 12.0–16.0)
LYMPHS ABS: 2.6 x10 3/mm (ref 1.0–3.6)
Lymphocyte %: 28.1 %
MCH: 29.6 pg (ref 26.0–34.0)
MCHC: 33 g/dL (ref 32.0–36.0)
MCV: 90 fL (ref 80–100)
MONOS PCT: 5.4 %
Monocyte #: 0.5 x10 3/mm (ref 0.2–0.9)
NEUTROS PCT: 62.9 %
Neutrophil #: 5.8 x10 3/mm (ref 1.4–6.5)
PLATELETS: 225 x10 3/mm (ref 150–440)
RBC: 4.38 10*6/uL (ref 3.80–5.20)
RDW: 14.3 % (ref 11.5–14.5)
WBC: 9.1 x10 3/mm (ref 3.6–11.0)

## 2014-07-29 LAB — FERRITIN: Ferritin (ARMC): 170 ng/mL (ref 8–388)

## 2014-08-27 ENCOUNTER — Ambulatory Visit: Payer: Self-pay | Admitting: Oncology

## 2014-10-22 ENCOUNTER — Inpatient Hospital Stay: Payer: Self-pay | Admitting: Internal Medicine

## 2014-10-22 LAB — URINALYSIS, COMPLETE
Bacteria: NONE SEEN
Bilirubin,UR: NEGATIVE
Blood: NEGATIVE
Ketone: NEGATIVE
LEUKOCYTE ESTERASE: NEGATIVE
Nitrite: NEGATIVE
Ph: 5 (ref 4.5–8.0)
Protein: NEGATIVE
Specific Gravity: 1.01 (ref 1.003–1.030)
Squamous Epithelial: <1

## 2014-10-22 LAB — COMPREHENSIVE METABOLIC PANEL
ALT: 43 U/L
ANION GAP: 8 (ref 7–16)
AST: 53 U/L — AB (ref 15–37)
Albumin: 3.1 g/dL — ABNORMAL LOW (ref 3.4–5.0)
Alkaline Phosphatase: 124 U/L — ABNORMAL HIGH
BUN: 23 mg/dL — AB (ref 7–18)
Bilirubin,Total: 0.3 mg/dL (ref 0.2–1.0)
CREATININE: 1.11 mg/dL (ref 0.60–1.30)
Calcium, Total: 8.6 mg/dL (ref 8.5–10.1)
Chloride: 100 mmol/L (ref 98–107)
Co2: 31 mmol/L (ref 21–32)
EGFR (African American): >60
EGFR (Non-African Amer.): 53 — ABNORMAL LOW
Glucose: 190 mg/dL — ABNORMAL HIGH (ref 65–99)
Osmolality: 286 (ref 275–301)
Potassium: 3.5 mmol/L (ref 3.5–5.1)
Sodium: 139 mmol/L (ref 136–145)
Total Protein: 7.1 g/dL (ref 6.4–8.2)

## 2014-10-22 LAB — CBC
HCT: 37.7 % (ref 35.0–47.0)
HGB: 12.5 g/dL (ref 12.0–16.0)
MCH: 29.3 pg (ref 26.0–34.0)
MCHC: 33.2 g/dL (ref 32.0–36.0)
MCV: 88 fL (ref 80–100)
Platelet: 216 10*3/uL (ref 150–440)
RBC: 4.27 10*6/uL (ref 3.80–5.20)
RDW: 13.7 % (ref 11.5–14.5)
WBC: 8.9 10*3/uL (ref 3.6–11.0)

## 2014-10-22 LAB — LIPASE, BLOOD: LIPASE: 114 U/L (ref 73–393)

## 2014-10-22 LAB — APTT: Activated PTT: 34.9 secs (ref 23.6–35.9)

## 2014-10-22 LAB — CK TOTAL AND CKMB (NOT AT ARMC)
CK, Total: 188 U/L
CK-MB: 1.4 ng/mL (ref 0.5–3.6)

## 2014-10-22 LAB — TROPONIN I
TROPONIN-I: 0.07 ng/mL — AB
Troponin-I: 0.06 ng/mL — ABNORMAL HIGH

## 2014-10-22 LAB — MAGNESIUM: MAGNESIUM: 1.7 mg/dL — AB

## 2014-10-22 LAB — PROTIME-INR
INR: 1
PROTHROMBIN TIME: 13.2 s (ref 11.5–14.7)

## 2014-10-22 LAB — CK: CK, TOTAL: 221 U/L — AB

## 2014-10-22 LAB — ETHANOL

## 2014-10-23 LAB — TROPONIN I: Troponin-I: 0.06 ng/mL — ABNORMAL HIGH

## 2014-10-23 LAB — TSH: Thyroid Stimulating Horm: 2.8 u[IU]/mL

## 2014-10-23 LAB — CBC WITH DIFFERENTIAL/PLATELET
Basophil #: 0.1 10*3/uL (ref 0.0–0.1)
Basophil %: 0.7 %
EOS PCT: 3.4 %
Eosinophil #: 0.3 10*3/uL (ref 0.0–0.7)
HCT: 38.8 % (ref 35.0–47.0)
HGB: 12.6 g/dL (ref 12.0–16.0)
Lymphocyte #: 2.5 10*3/uL (ref 1.0–3.6)
Lymphocyte %: 26.8 %
MCH: 28.9 pg (ref 26.0–34.0)
MCHC: 32.4 g/dL (ref 32.0–36.0)
MCV: 89 fL (ref 80–100)
Monocyte #: 0.7 x10 3/mm (ref 0.2–0.9)
Monocyte %: 7.4 %
NEUTROS PCT: 61.7 %
Neutrophil #: 5.7 10*3/uL (ref 1.4–6.5)
PLATELETS: 230 10*3/uL (ref 150–440)
RBC: 4.35 10*6/uL (ref 3.80–5.20)
RDW: 14 % (ref 11.5–14.5)
WBC: 9.2 10*3/uL (ref 3.6–11.0)

## 2014-10-23 LAB — CK TOTAL AND CKMB (NOT AT ARMC)
CK, TOTAL: 161 U/L
CK-MB: 1 ng/mL (ref 0.5–3.6)

## 2014-10-23 LAB — BASIC METABOLIC PANEL
Anion Gap: 9 (ref 7–16)
BUN: 17 mg/dL (ref 7–18)
CO2: 29 mmol/L (ref 21–32)
CREATININE: 0.99 mg/dL (ref 0.60–1.30)
Calcium, Total: 8.5 mg/dL (ref 8.5–10.1)
Chloride: 101 mmol/L (ref 98–107)
EGFR (African American): >60
Glucose: 250 mg/dL — ABNORMAL HIGH (ref 65–99)
OSMOLALITY: 288 (ref 275–301)
Potassium: 4.1 mmol/L (ref 3.5–5.1)
SODIUM: 139 mmol/L (ref 136–145)

## 2014-10-23 LAB — MAGNESIUM: MAGNESIUM: 1.8 mg/dL

## 2014-10-23 LAB — LIPID PANEL
Cholesterol: 280 mg/dL — ABNORMAL HIGH (ref 0–200)
HDL: 44 mg/dL (ref 40–60)
Ldl Cholesterol, Calc: 185 mg/dL — ABNORMAL HIGH (ref 0–100)
Triglycerides: 257 mg/dL — ABNORMAL HIGH (ref 0–200)
VLDL CHOLESTEROL, CALC: 51 mg/dL — AB (ref 5–40)

## 2014-10-23 LAB — HEMOGLOBIN A1C: Hemoglobin A1C: 9.3 % — ABNORMAL HIGH (ref 4.2–6.3)

## 2014-10-23 LAB — HEPARIN LEVEL (UNFRACTIONATED): Anti-Xa(Unfractionated): <0.1 IU/mL — ABNORMAL LOW (ref 0.30–0.70)

## 2014-10-24 LAB — CBC WITH DIFFERENTIAL/PLATELET
Basophil #: 0.1 10*3/uL (ref 0.0–0.1)
Basophil %: 0.7 %
EOS ABS: 0.4 10*3/uL (ref 0.0–0.7)
Eosinophil %: 4.2 %
HCT: 37.2 % (ref 35.0–47.0)
HGB: 12.1 g/dL (ref 12.0–16.0)
Lymphocyte #: 2.8 10*3/uL (ref 1.0–3.6)
Lymphocyte %: 28.2 %
MCH: 29.6 pg (ref 26.0–34.0)
MCHC: 32.6 g/dL (ref 32.0–36.0)
MCV: 91 fL (ref 80–100)
MONO ABS: 0.7 x10 3/mm (ref 0.2–0.9)
MONOS PCT: 6.7 %
Neutrophil #: 6 10*3/uL (ref 1.4–6.5)
Neutrophil %: 60.2 %
PLATELETS: 208 10*3/uL (ref 150–440)
RBC: 4.09 10*6/uL (ref 3.80–5.20)
RDW: 14.2 % (ref 11.5–14.5)
WBC: 10 10*3/uL (ref 3.6–11.0)

## 2014-12-10 ENCOUNTER — Ambulatory Visit: Payer: Self-pay | Admitting: Oncology

## 2014-12-10 LAB — CBC CANCER CENTER
BASOS PCT: 0.9 %
Basophil #: 0.1 x10 3/mm (ref 0.0–0.1)
EOS ABS: 0.4 x10 3/mm (ref 0.0–0.7)
EOS PCT: 4.9 %
HCT: 39.5 % (ref 35.0–47.0)
HGB: 12.6 g/dL (ref 12.0–16.0)
LYMPHS ABS: 2.4 x10 3/mm (ref 1.0–3.6)
LYMPHS PCT: 30.6 %
MCH: 28 pg (ref 26.0–34.0)
MCHC: 32 g/dL (ref 32.0–36.0)
MCV: 88 fL (ref 80–100)
MONOS PCT: 4.9 %
Monocyte #: 0.4 x10 3/mm (ref 0.2–0.9)
Neutrophil #: 4.6 x10 3/mm (ref 1.4–6.5)
Neutrophil %: 58.7 %
Platelet: 197 x10 3/mm (ref 150–440)
RBC: 4.51 10*6/uL (ref 3.80–5.20)
RDW: 13.9 % (ref 11.5–14.5)
WBC: 7.8 x10 3/mm (ref 3.6–11.0)

## 2014-12-10 LAB — IRON AND TIBC
IRON: 55 ug/dL (ref 50–170)
Iron Bind.Cap.(Total): 277 ug/dL (ref 250–450)
Iron Saturation: 20 %
Unbound Iron-Bind.Cap.: 222 ug/dL

## 2014-12-10 LAB — FERRITIN: Ferritin (ARMC): 74 ng/mL (ref 8–388)

## 2014-12-19 ENCOUNTER — Ambulatory Visit: Payer: Self-pay | Admitting: Orthopedic Surgery

## 2014-12-27 ENCOUNTER — Ambulatory Visit: Payer: Self-pay | Admitting: Oncology

## 2014-12-27 DIAGNOSIS — Z22322 Carrier or suspected carrier of Methicillin resistant Staphylococcus aureus: Secondary | ICD-10-CM

## 2014-12-27 HISTORY — DX: Carrier or suspected carrier of methicillin resistant Staphylococcus aureus: Z22.322

## 2015-01-24 ENCOUNTER — Ambulatory Visit: Payer: Self-pay | Admitting: Orthopedic Surgery

## 2015-03-11 ENCOUNTER — Ambulatory Visit
Admit: 2015-03-11 | Disposition: A | Discharge status: Home / Self Care | Payer: Self-pay | Attending: Oncology | Admitting: Oncology

## 2015-03-28 ENCOUNTER — Ambulatory Visit
Admit: 2015-03-28 | Disposition: A | Discharge status: Home / Self Care | Payer: Self-pay | Attending: Oncology | Admitting: Oncology

## 2015-04-18 ENCOUNTER — Other Ambulatory Visit: Payer: Self-pay | Admitting: Internal Medicine

## 2015-04-18 DIAGNOSIS — N63 Unspecified lump in unspecified breast: Secondary | ICD-10-CM

## 2015-04-18 NOTE — Discharge Summary (Signed)
PATIENT NAME:  Brittney [name], Brittney [name] MR#:  [id number] DATE OF BIRTH:  10/20/1951  DATE OF ADMISSION:  09/24/2013 DATE OF DISCHARGE: 09/27/2013   ADMITTING DIAGNOSES:  1.  Degenerative arthritis of the left knee.  2.  Seronegative rheumatoid arthritis.  DISCHARGE DIAGNOSES:  1.  Degenerative arthritis of the left knee.  2.  Seronegative rheumatoid arthritis.  PROCEDURE: Right total knee arthroplasty using computer-aided navigation.   SURGEON: Dr. Marry Guan.   ASSISTANT: Vance Peper, PA.   ANESTHESIA: Spinal.   ESTIMATED BLOOD LOSS: 100 mL.  TOURNIQUET TIME: 80 minutes.   DRAINS: Two medium drains to reinfusion system.   IMPLANTS USED: DePuy PFC Sigma size 3 posterior stabilized femoral component that was cemented, size 3 MBT tibial component (cemented), 35 mm 3-peg oval dome patella that was cemented, a 10 mm rotating platform polyethylene insert. Gentamicin cement was used. The patient was stabilized, brought to the recovery room and then brought down to the orthopedic floor.   HISTORY: The patient is a 64 year old female, who presented for upcoming left total knee arthroplasty. The patient had severe pains, had aggravation with weight bearing activities. The patient has been refractory to conservative treatment including cortisone injections, Synvisc, nonsteroidal anti-inflammatories, ambulatory aids and activity modifications.  PHYSICAL EXAMINATION:   GENERAL: A well-developed, well-nourished female with an antalgic gait using a walker with a  valgus thrust to the left knee.  LUNGS: Clear to auscultation.  CARDIAC: Regular rate and rhythm.  MUSCULOSKELETAL: In regard to the left lower extremity, the patient has soft tissue swelling with lateral joint line tenderness. The patient has full extension to 107 degrees and flexion with pain. There was no atrophy noted.   HOSPITAL COURSE: After initial admission on 09/24/2013, the patient was brought to the orthopedic floor. On postoperative day  1, the patient had a hemoglobin of 10.9, which was at 10.8 on postoperative day 2. The patient remained stable there. The patient had normal BMP and was doing fine. The patient worked with physical therapy, initially bed to chair and progressed up to ambulating around the nurse's station. The patient is ready to go home with home health physical therapy.   DISCHARGE INSTRUCTIONS: The patient will follow up with [facility] orthopedics on 10/09/2013. The patient's activity is weight-bear as tolerated. The patient is to use thigh-high ED hose on both legs, removed at bedtime. The patient will elevate heels off the bed. Use incentive spirometer. The patient was encouraged to do cough and deep breathing. The patient will do a regular diet. The patient has a [place] for decreased swelling. The patient will have a dressing change as needed by physical therapy. The patient will call the clinic if there is any bright red bleeding, calf pain, or bowel or bladder difficulty and any fever greater than 101.5. The patient will do home health physical therapy doing gait training and range of motion activities. The patient will do home occupational therapy.   DISCHARGE MEDICATIONS: Resume home medications. The patient will use Lovenox 40 mg subcutaneous once a day for 14 days, then begin 81 mg aspirin once a day after finishing Lovenox. The patient will use oxycodone 1 to 2 tablets every 4 hours as needed for severe pain. Tramadol 50 mg 1 to 2 tablets every 4 hours as needed for mild to moderate pain. Tylenol 1 tablet every 4 hours as needed for less severe pain or fever greater than 100.4.  ____________________________ J. Reche Dixon, [place] [email or web address] D: 09/27/2013 07:31:26 ET T: 09/27/2013 08:01:14 ET  JOB#: [id number]  cc: J. Reche Dixon, [place], <Dictator> J [facility] [address] PA ELECTRONICALLY SIGNED 09/28/2013 7:48

## 2015-04-18 NOTE — Op Note (Signed)
PATIENT NAME:  Brittney [name], Brittney [name] MR#:  [id number] DATE OF BIRTH:  21-Jun-1951  DATE OF PROCEDURE:  09/24/2013  PREOPERATIVE DIAGNOSES:  1.  Degenerative arthrosis of the left knee.  2.  Seronegative rheumatoid arthritis.   POSTOPERATIVE DIAGNOSIS:  1.  Degenerative arthrosis of the left knee.  2.  Seronegative rheumatoid arthritis.  PROCEDURE PERFORMED: Right total knee arthroplasty using computer-assisted navigation.   SURGEON: Laurice Record. Hooten, M.D.   ASSISTANT: Vance Peper, PA, (required to maintain retraction throughout the procedure).   ANESTHESIA: Spinal.   ESTIMATED BLOOD LOSS: 100 mL.   FLUIDS REPLACED: 1700 mL of crystalloid.   TOURNIQUET TIME: 80 minutes.   DRAINS: Two medium drains to reinfusion system.   SOFT TISSUE RELEASES: Anterior cruciate ligament, posterior cruciate ligament, deep medial collateral ligament and patellofemoral ligament.   IMPLANTS UTILIZED: DePuy PFC Sigma size 3 posterior stabilized femoral component (cemented), size 3 MBT tibial component (cemented), 35 mm 3-peg oval dome patella (cemented) and a 10 mm stabilized rotating platform polyethylene insert. Gentamicin cement was utilized.   INDICATIONS FOR SURGERY: The patient is a 64 year old female with history of seronegative rheumatoid arthritis and progressive degenerative changes to the left knee. X-rays demonstrated degenerative changes in tricompartmental fashion. The pain had increased to the point that it was significantly interfering with her activities of daily living. The patient expressed understanding of the risks and benefits of surgical intervention and agreed with plans for total knee arthroplasty.   PROCEDURE IN DETAIL: The patient was brought into the operating room, then and after adequate spinal anesthesia was achieved, a tourniquet was placed on the patient's upper left thigh. The patient's left knee and leg were cleaned and prepped with alcohol and DuraPrep and draped in the usual  sterile fashion. A "timeout" was performed as per usual protocol. The left lower extremity was exsanguinated using an Esmarch and the tourniquet was inflated to 300 mmHg. An anterior longitudinal incision was made followed by a standard mid vastus approach. A small effusion was evacuated. Deep fibers of the medial collateral ligament were elevated in a subperiosteal fashion off the medial flare of the tibia so as to maintain continuous soft tissue sleeve. The patella was subluxed laterally and the patellofemoral ligament was incised. Some synovial tissue was noted to be thickened with areas of hemorrhagic areas. Part of the synovial tissue from the lateral gutter was submitted for pathology. Inspection of the knee demonstrated severe degenerative changes in tricompartmental fashion. Osteophytes were debrided using a rongeur. Anterior and posterior cruciate ligaments were excised. Two 4.0 mm Schanz pins were inserted into the femur and into the tibia for attachment of the ray of trackers used for computer-assisted navigation. Hip center was identified using circumduction technique. Distal landmarks were mapped using the computer. Distal femur and proximal tibia were mapped using the computer. Distal femoral cutting guide was positioned using computer-assisted navigation so as to achieve 5 degree distal valgus cut. Cut was performed and verified using the computer. Distal femur was sized and it was felt that a size 3 femoral component was appropriate. A size 3 cutting guide was positioned and anterior cut was performed and verified using the computer. This was followed by completion of the posterior and chamfer cuts. Femoral cutting guide for central box was then positioned and the central box cut was performed.   Attention was then directed to the proximal tibia. Medial and lateral menisci were excised. The extramedullary tibial cutting guide was positioned using computer-assisted navigation so as to achieve 0  degree varus valgus alignment and 0 degree posterior slope. Cut was performed and verified using the computer. The proximal tibia was sized and it was felt that a size 3 tibial plate was appropriate. Tibial and femoral trials were inserted followed by insertion of a 10 mm polyethylene insert. Excellent mediolateral soft tissue balancing was appreciated both in full extension and in flexion. Finally, the patella was cut and prepared so as to accommodate a 35 mm 3-peg oval dome patella. Patellar trial was placed and placed through range of motion with good patellar tracking noted.   Femoral trial was removed after inspection for any posterior osteophytes. Central post hole for the tibial component was reamed followed by insertion of a keel punch. Tibial tray was then removed. The cut surfaces of bone were irrigated with copious amounts of normal saline with antibiotic solution using pulsatile lavage and then suctioned dry. Polymethyl methacrylate cement with gentamicin was prepared in the usual fashion using a vacuum mixer. Cement was applied to the cut surface the proximal tibia as well as along the undersurface of a size 3 MBT tibial component. The tibial component was positioned and impacted into place. Excess cement was removed using freer elevators. Cement was then applied to the cut surface of the femur as well as along the posterior flanges of a size 3 posterior stabilized femoral component. Femoral component was positioned and impacted into place. Excess cement was removed using freer elevators. A 10 mm polyethylene trial was inserted and the knee was brought in full extension with steady axial compression applied. Finally, cement was applied to the backside of a 35 mm, 3-peg oval dome patella and patellar component was positioned and patellar clamp applied. Excess cement was removed using freer elevators.   After adequate curing of the cement, the tourniquet was deflated after a total tourniquet time of  80 minutes. Hemostasis was achieved using electrocautery. The knee was irrigated with copious amounts of normal saline with antibiotic solution using pulsatile lavage and then suctioned dry. The knee was inspected for any residual cement debris. Exparel 20 mL of 1.3% in 40 mL of normal saline was injected along the posterior recesses, medial and lateral gutters, and along the arthrotomy site. A 10 mm stabilized rotating platform polyethylene insert was inserted and the knee was placed through a range of motion. Excellent mediolateral soft tissue balancing was appreciated and excellent patellar tracking appreciated. Two medium drains were placed in the wound bed and brought out through a separate stab incision to be attached to a reinfusion system. The medial parapatellar portion of the incision was reapproximated using interrupted sutures of #1 Vicryl. The subcutaneous tissue was injected with a total of 30 mL of 0.25% Marcaine with epinephrine. The subcutaneous tissue was approximated in layers using first #0 Vicryl followed by 2-0 Vicryl. The skin was closed with skin staples. Sterile dressing was applied. The patient tolerated the procedure well. She was transported to the recovery room in stable condition.   ____________________________ Laurice Record. Holley Bouche., MD jph:aw D: 09/24/2013 11:34:15 ET T: 09/24/2013 12:00:27 ET JOB#: [id number]  cc: Laurice Record. Holley Bouche., MD, <Dictator> JAMES P Holley Bouche MD ELECTRONICALLY SIGNED 09/29/2013 9:39

## 2015-04-19 NOTE — Discharge Summary (Signed)
PATIENT NAME:  Brittney [name], Brittney [name] MR#:  [id number] DATE OF BIRTH:  09-16-1951  DATE OF ADMISSION:  10/22/2014 DATE OF DISCHARGE:  10/25/2014  DISCHARGE DIAGNOSES: 1.  Syncope, secondary to hypotension. 2.  Right distal fibular fracture.  3.  Bronchitis.  4.  Diabetes mellitus, insulin requiring.  5.  History of uncontrolled hypertension.  6.  Fibromyalgia.  7.  Anxiety/depression.   DISCHARGE MEDICATIONS: 1.  Doxazosin 4 mg at bedtime.  2.  Atenolol 100 mg at bedtime. 3.  Ambien 10 mg at bedtime. 4.  Trazodone 150 mg daily. 5.  Mirapex 0.5 mg 2 tabs at bedtime. 6.  Flonase 2 sprays daily.  7.  Gabapentin 600 mg 5 tabs throughout the day. 8.  Glimepiride 4 mg daily.  9.  Potassium chloride 10 mEq daily.  10.  Janumet 50/500 b.i.d.  11.  Lantus 80 units at bedtime.  12.  Protonix 40 mg daily.  13.  Xanax 0.5 mg t.i.d. and at bedtime. 14.  Flexeril 20 mg at bedtime. 15.  Lexapro 10 mg daily. 16.  Lisinopril 40 mg daily.  17.  Reglan 5 mg at bedtime. 18.  NovoLog 36 units q. p.m., 30 units a.m. and 34 units at lunch. 19.  Torsemide 20 mg daily. 20.  Tramadol 50 mg to 100 mg t.i.d. p.r.n. pain.  21.  Norco 5/325 t.i.d. p.r.n. pain. 22.  Augmentin 875 mg b.i.d. x1 week.   REASON FOR ADMISSION: A 64 year old female who presents with syncope and right fibular fracture. Please see H and P for HPI, past medical history, and physical exam.   HOSPITAL COURSE: The patient was admitted. Blood pressure was 90/40 accounting for her recurrent syncopal spells. It is pretty evident when she is anxious or agitated her blood pressure is up, otherwise, it was quite low. The clonidine patch was held. Her blood pressure came back up. Her meds were re-added, except the patch and she was stable without recurrent syncope. Cardiac enzymes and heart Lexiscan were normal. With some atypical chest pain, which seemed to be more anxiety related, she underwent chest CT scanning, which was normal, showing no PE.  She did have a bronchitis treated with IV Rocephin and switched over to p.o. Augmentin. She was found to have a right fibular fracture. Dr. Marry Guan recommended no weight-bearing for 2 weeks and he will see her back in the office with thoughts of casting her in about a month. Skilled nursing was suggested, but the patient declined and wanted to go home. She had significant edema that she auto-diuresed once she was lying in bed for 24 to 36 hours without any IV Lasix given. Overall prognosis is guarded with her multiple problems.  ____________________________ Rusty Aus, MD mfm:sb D: 10/25/2014 07:20:08 ET T: 10/25/2014 09:23:40 ET JOB#: [id number]  cc: Rusty Aus, MD, <Dictator> Navaeh Kehres Roselee Culver MD ELECTRONICALLY SIGNED 10/26/2014 9:49

## 2015-04-19 NOTE — Consult Note (Signed)
Brief Consult Note: Diagnosis: Right distal fibula fracture (oblique, minimally displaced).   Patient was seen by consultant.   Comments: Patient fell yesterday and had the onset of right lateral ankle pain and was unable to bear weight on the RLE. Radiographs demonstrated fracture as described above. Splint applied in ER. Neurovascular function intact. Maintain NWB. PolarCare and elevation. Do not anticipate the need for surgical intervention. Follow-up in the office in 10-14 days.  Electronic Signatures: Dereck Leep (MD)  (Signed 27-Oct-15 20:19)  Authored: Brief Consult Note   Last Updated: 27-Oct-15 20:19 by Dereck Leep (MD)

## 2015-04-19 NOTE — H&P (Signed)
PATIENT NAME:  Brittney [name], Brittney [name] MR#:  [id number] DATE OF BIRTH:  [birth year]  DATE OF ADMISSION:  10/22/2014  PRIMARY CARE PHYSICIAN: Dr. Emily Filbert.  CHIEF COMPLAINT: Chest pain and syncope multiple times. The patient is alert, awake, oriented, in no acute distress. The patient said she has been feeling weak for the past few weeks. In addition, patient has intermittent chest pain on the right side, radiation to the neck. She had some nausea but denies any abdominal pain, vomiting, or diarrhea. No palpitations, orthopnea, nocturnal dyspnea. The patient said she has chronic lymphedema of bilateral lower extremities. In addition, the patient said that she passed out 5 times for the past 1 week. The patient denies any symptoms before passing out. She denies any headache, dizziness, incontinence. Passing out is about several minutes. The patient said the loss of consciousness was about several minutes. The patient denies any other symptoms.   PAST MEDICAL HISTORY: Hypertension, diabetes, hyperlipidemia, sleep apnea, Crohn disease, nephrolithiasis, rheumatoid arthritis, fatty liver, asthma.   PAST SURGICAL HISTORY: D and C, cholecystectomy, hysterectomy, left knee replacement, back surgery, cholecystectomy.   SOCIAL HISTORY: Denies any smoking or drinking or illicit drugs.   FAMILY HISTORY: Hypertension, diabetes. Mother has a heart attack.   ALLERGIES: IMDUR, NORVASC, TAPE, MSG, SHELLFISH.   HOME MEDICATIONS:  1. Alprazolam 0.5 mg p.o. t.i.d. 2. Alprazolam 0.5 mg 2 tablets once a day at bedtime. 3. Atenolol 100 mg p.o. daily. 4. Catapres-TTS-2 at 0.2 mg per 24 hours 1 patch transdermal once a week. 5. Cyclobenzaprine 10 mg p.o. 2 tablets a day at bedtime. 6. Doxazosin 4 mg p.o. bedtime. 7. Escitalopram 10 mg 1 tablet once a day. 8. Fluticasone 0.05 mg in each nasal spray 2 sprays once a day at bedtime. 9. Gabapentin 600 mg 5 tablets once a day at bedtime. 10. Glimepiride 4 mg p.o. daily. 11.  Ibuprofen 800 mg b.i.d. 12. Janumet 5 mg/500 mg p.o. b.i.d. p.r.n. for high blood sugar. 13. Klor-Con 10 mEq p.o. daily. 14. Lantus 80 units subcutaneous at bedtime. 15. Levaquin 5% topical film apply topically to affected area once a day p.r.n. 16. Lisinopril 40 mg p.o. once a day. 17. Metoclopramide 5 mg p.o. at bedtime. 18. NovoLog 30 units subcutaneous in the morning 35 subcutaneous at lunch time, 36 in the evening. 19. Paxil 0.5 mg 2 tablets once a day at bedtime. 20. Protonix 40 mg p.o. daily. 21. Lasix 20 mg p.o. daily. 22. Tramadol 50 mg 2 tablets 3 times a day p.r.n. 23. Trazodone 150 mg p.o. at bedtime. 24. Ventolin HFA 90 mcg inhalation 2 puffs 4 times a day p.r.n. 25. Zolpidem 10 mg p.o. at bedtime.   REVIEW OF SYSTEMS:  CONSTITUTIONAL: The patient denies any fever or chills. Sometimes headache, dizziness, and generalized weakness.  EYES: No double vision, no blurry vision.  ENT: No postnasal drip, slurred speech, or dysphagia.  CARDIOVASCULAR: Positive for chest pain. No palpitations, orthopnea, nocturnal dyspnea, but has leg edema.  PULMONARY: No cough, sputum, shortness of breath or hemoptysis.  GASTROINTESTINAL: No abdominal pain, nausea, vomiting, or diarrhea. No melena or bloody stool.  GENITOURINARY: No dysuria, hematuria, or incontinence.  SKIN: No rash or jaundice.  NEUROLOGY: Positive for syncope, loss of consciousness, but no seizure.  ENDOCRINE: No polyuria, polydipsia, heat or cold intolerance.  HEMATOLOGIC: No easy bruising or bleeding.   PHYSICAL EXAMINATION:  VITAL SIGNS: Temperature 98.6, blood pressure is 136/64, pulse 77, oxygen saturation 98% on room air.  GENERAL: The patient is  alert, awake, oriented, in no acute distress.  HEENT: Pupils round, equal and reactive to light and accommodation. Moist oral mucosa. Clear oropharynx.  NECK: Supple. No JVD or carotid bruit. No lymphadenopathy. No thyromegaly.  CARDIOVASCULAR: S1, S2 regular rate and rhythm.  No murmurs or gallops.  PULMONARY: Bilateral air entry. No wheezing or rales. No use of accessory muscle to breathe.  ABDOMEN: Soft. No distention or tenderness. No organomegaly. Bowel sounds present. Obese.  EXTREMITIES: Bilateral lower extremities edema. No clubbing or cyanosis. No calf tenderness. Left lower extremity: Mild erythema. The right lower extremity: Tenderness and swelling.  NEUROLOGY: A and O x 3. No focal deficit. Power 4/5. Sensation intact.   LABORATORY DATA: Right ankle x-ray showed oblique fracture of the right distal fibula diaphysis. Chest x-ray: No acute abnormality. CAT scan of head stable diffuse slight atrophy.  Glucose 190, BUN 23, creatinine 1.11, electrolyte normal, AST 53, albumin 3.1. ETOH  is less than 3. CBC is in normal range. Magnesium 1.7. Troponin 0.07.   IMPRESSIONS:  1. Non-ST segment elevation myocardial infarction. 2. Syncope.  3. Hypertension.  4. Diabetes.  5. Hypomagnesemia.  6. Syncope.  7. Right distal fibular diaphysis fracture.  8. Hypomagnesemia.  9. Obstructive sleep apnea. 10. Obesity.  11. Dehydration.  PLAN OF TREATMENT:  1. The patient will be admitted to telemetry floor. Dr. Karma Greaser, ED physician, started heparin drip. We will continue heparin drip and start aspirin 325 mg p.o. daily with a statin.  2. We will get a followup troponin level, get a cardiology consult from [facility] cardiologist.  3. For syncope. We will get a carotid duplex and follow up cardiology recommendations.  4. For hypomagnesemia, will give magnesium and follow up level.  5. For right fibular fracture, with get a consult from Dr. Marry Guan.  6. For diabetes, we will start a sliding scale. Continue Lantus and NovoLog t.i.d.  7. For dehydration, we will give IV fluid support. Follow up BMP.  8. I discussed the patient's condition and plan of treatment with the patient and the patient's daughter.  CODE STATUS: The patient wants full code.   TIME SPENT: About 63  minutes.     ____________________________ Demetrios Loll, MD qc:lt D: 10/22/2014 18:29:11 ET T: 10/22/2014 19:42:30 ET JOB#: [id number]  cc: Demetrios Loll, MD, <Dictator> Demetrios Loll MD ELECTRONICALLY SIGNED 10/23/2014 16:00

## 2015-04-19 NOTE — Consult Note (Signed)
PATIENT NAME:  Brittney [name], Brittney [name] MR#:  [id number] DATE OF BIRTH:  05-28-1951  DATE OF CONSULTATION:  10/23/2014  CONSULTING PHYSICIAN:  Dwayne D. Callwood, MD  CHIEF COMPLAINT: Abnormal EKG, syncope with chest pain.   PRIMARY PHYSICIAN: Dr. Emily Filbert.  HISTORY OF PRESENT ILLNESS: The patient is a 64 year old with chest pain and syncope. The patient complains of nausea and right-sided chest pain radiating to her neck. No palpitations or tachycardia. The patient has had multiple episodes of falling, ended up fracturing her right foot and then was brought to the Emergency Room. The patient has had loss of consciousness for several times, unclear etiology, now presenting for evaluation.   PAST MEDICAL HISTORY: Hypertension, diabetes, hyperlipidemia, sleep apnea, Crohn disease, nephrolithiasis, rheumatoid arthritis, fatty liver, asthma.   PAST SURGICAL HISTORY: D and C, cholecystectomy, hysterectomy, left knee replacement, back surgery, cholecystectomy.   SOCIAL HISTORY: No smoking or alcohol consumption.   FAMILY HISTORY: Hypertension, diabetes, heart attack.    ALLERGIES: IMDUR, NORVASC, TAPE, MSG, SHELLFISH.   HOME MEDICATIONS: Alprazolam 0.5 t.i.d., atenolol 100 mg a day, Catapres 0.2 once a day, Flexeril 10 mg 2 at bedtime, doxazosin 1 mg at bedtime, escitalopram 10 mg once a day, fluticasone 0.05 one spray to each nostril at bedtime, gabapentin 600 five tablets a day at bedtime, glimepiride 4 mg a day, ibuprofen 800 mg 3 times a day, Janumet 5/500 twice a day, Klor-Con 10 mEq a day, Lantus 80 units subcutaneous at bedtime, Levaquin topical film to affected area, lisinopril 10 mg a day, metoclopramide 5 mg at bedtime, NovoLog 35 units at bedtime, 30 units in the morning, Paxil 0.5 two tablets once a day, Protonix 40 a day, Lasix 20 a day, tramadol 50 two tablets 3 times a day, trazodone 150 at bedtime, Ventolin as needed, Zyloprim 10 mg at bedtime.   REVIEW OF SYSTEMS: Positive blackout  spells, syncope. She has had some nausea but no vomiting. No fever. No chills. No sweats. No weight loss. No weight gain, hemoptysis, hematemesis. Denies bright red blood per rectum. No vision change or hearing change. Denies sputum production or cough.   PHYSICAL EXAMINATION:  VITAL SIGNS: Blood pressure 140/60, pulse of 75, respiratory rate of 16, afebrile.   HEENT: Normocephalic, atraumatic. Pupils equal, reactive to light.  NECK: Supple. No JVD, bruits, or adenopathy.  LUNGS: Clear to auscultation and percussion.  HEART: Regular rate and rhythm.  NEUROLOGIC: Intact.  SKIN: Normal.   LABORATORY DATA: Right ankle x-ray shows fracture. Chest x-ray negative. CT of the head negative, diffuse atrophy.   Glucose 190, BUN of 23, creatinine 1.11, AST 53, albumin 3.1. CBC normal. Magnesium 1.7. Troponin 0.07.  IMPRESSION: Possible non-Q-wave myocardial infarction, syncope, hypertension, diabetes, hypoglycemia, right fibular fracture, hypomagnesemia, hypertension, diabetes, obstructive sleep apnea, obesity, dehydration.   PLAN: 1. I recommend rule out for myocardial infarction. Follow up cardiac enzymes. Follow up EKGs. Recommend continue current therapy. Recommend functional study for further evaluation, probably [facility] or Myoview. 2. For syncope recommend further evaluation. 3.  For diabetes, continue sliding scale, continue Lantus and NovoLog.  4.  For dehydration,  support for BMP.  5. For obstructive sleep apnea, recommend sinus. Recommend treatment with CPAP and therapy.  6. For hypomagnesemia, recommend followup. May replace magnesium. 7. Hypertension. Continue current treatment and therapy. Will follow up echocardiogram, Myoview. Base further evaluation on the results of these studies.  8. Deep vein thrombosis prophylaxis in the interim.  9. Would recommend Xanax therapy. 10. Continue inhalers as necessary.  11. Continue GERD therapy. 12. Protonix therapy for necessary. 13. Continue  current therapy and base further evaluation on the results of further studies.    ____________________________ Loran Senters Clayborn Bigness, MD ddc:lt D: 10/23/2014 12:19:35 ET T: 10/23/2014 14:27:16 ET JOB#: [id number]  cc: Dwayne D. Clayborn Bigness, MD, <Dictator> Yolonda Kida MD ELECTRONICALLY SIGNED 11/25/2014 9:56

## 2015-04-22 ENCOUNTER — Other Ambulatory Visit: Payer: Self-pay | Admitting: Internal Medicine

## 2015-04-22 DIAGNOSIS — N6310 Unspecified lump in the right breast, unspecified quadrant: Secondary | ICD-10-CM

## 2015-04-30 ENCOUNTER — Ambulatory Visit: Payer: Self-pay

## 2015-04-30 ENCOUNTER — Ambulatory Visit
Admission: RE | Admit: 2015-04-30 | Discharge: 2015-04-30 | Disposition: A | Discharge status: Home / Self Care | Payer: Medicare Other | Source: Ambulatory Visit | Attending: Internal Medicine | Admitting: Internal Medicine

## 2015-04-30 ENCOUNTER — Other Ambulatory Visit: Payer: Self-pay | Admitting: Internal Medicine

## 2015-04-30 DIAGNOSIS — N6310 Unspecified lump in the right breast, unspecified quadrant: Secondary | ICD-10-CM

## 2015-04-30 DIAGNOSIS — N63 Unspecified lump in breast: Secondary | ICD-10-CM | POA: Insufficient documentation

## 2015-04-30 HISTORY — DX: Malignant (primary) neoplasm, unspecified: C80.1

## 2016-01-27 DIAGNOSIS — I10 Essential (primary) hypertension: Secondary | ICD-10-CM | POA: Insufficient documentation

## 2016-02-20 DIAGNOSIS — R809 Proteinuria, unspecified: Secondary | ICD-10-CM | POA: Insufficient documentation

## 2016-03-16 DIAGNOSIS — Z6841 Body Mass Index (BMI) 40.0 and over, adult: Secondary | ICD-10-CM

## 2016-03-16 DIAGNOSIS — Z794 Long term (current) use of insulin: Secondary | ICD-10-CM | POA: Insufficient documentation

## 2016-03-16 DIAGNOSIS — E1121 Type 2 diabetes mellitus with diabetic nephropathy: Secondary | ICD-10-CM | POA: Insufficient documentation

## 2016-03-16 DIAGNOSIS — E1151 Type 2 diabetes mellitus with diabetic peripheral angiopathy without gangrene: Secondary | ICD-10-CM | POA: Insufficient documentation

## 2016-03-16 DIAGNOSIS — E113293 Type 2 diabetes mellitus with mild nonproliferative diabetic retinopathy without macular edema, bilateral: Secondary | ICD-10-CM | POA: Insufficient documentation

## 2016-04-02 ENCOUNTER — Other Ambulatory Visit: Payer: Self-pay | Admitting: Internal Medicine

## 2016-04-02 DIAGNOSIS — Z1231 Encounter for screening mammogram for malignant neoplasm of breast: Secondary | ICD-10-CM

## 2016-04-15 DIAGNOSIS — M797 Fibromyalgia: Secondary | ICD-10-CM | POA: Insufficient documentation

## 2016-04-15 DIAGNOSIS — M1380 Other specified arthritis, unspecified site: Secondary | ICD-10-CM | POA: Insufficient documentation

## 2016-05-03 ENCOUNTER — Ambulatory Visit
Admission: RE | Admit: 2016-05-03 | Discharge: 2016-05-03 | Disposition: A | Discharge status: Home / Self Care | Payer: Medicare Other | Source: Ambulatory Visit | Attending: Internal Medicine | Admitting: Internal Medicine

## 2016-05-03 ENCOUNTER — Other Ambulatory Visit: Payer: Self-pay | Admitting: Internal Medicine

## 2016-05-03 DIAGNOSIS — Z1231 Encounter for screening mammogram for malignant neoplasm of breast: Secondary | ICD-10-CM

## 2016-05-27 ENCOUNTER — Other Ambulatory Visit: Payer: Self-pay | Admitting: Gastroenterology

## 2016-05-27 DIAGNOSIS — R1084 Generalized abdominal pain: Secondary | ICD-10-CM

## 2016-06-03 ENCOUNTER — Ambulatory Visit
Admission: RE | Admit: 2016-06-03 | Discharge: 2016-06-03 | Disposition: A | Discharge status: Home / Self Care | Payer: Medicare Other | Source: Ambulatory Visit | Attending: Gastroenterology | Admitting: Gastroenterology

## 2016-06-03 DIAGNOSIS — K5730 Diverticulosis of large intestine without perforation or abscess without bleeding: Secondary | ICD-10-CM | POA: Diagnosis not present

## 2016-06-03 DIAGNOSIS — R1084 Generalized abdominal pain: Secondary | ICD-10-CM

## 2016-06-03 DIAGNOSIS — R590 Localized enlarged lymph nodes: Secondary | ICD-10-CM | POA: Diagnosis not present

## 2016-06-03 DIAGNOSIS — I2510 Atherosclerotic heart disease of native coronary artery without angina pectoris: Secondary | ICD-10-CM | POA: Insufficient documentation

## 2016-06-03 DIAGNOSIS — I517 Cardiomegaly: Secondary | ICD-10-CM | POA: Insufficient documentation

## 2016-06-03 DIAGNOSIS — N200 Calculus of kidney: Secondary | ICD-10-CM | POA: Diagnosis not present

## 2016-06-03 HISTORY — DX: Essential (primary) hypertension: I10

## 2016-06-03 HISTORY — DX: Systemic involvement of connective tissue, unspecified: M35.9

## 2016-06-03 HISTORY — DX: Unspecified asthma, uncomplicated: J45.909

## 2016-06-03 HISTORY — DX: Type 2 diabetes mellitus without complications: E11.9

## 2016-06-03 LAB — POCT I-STAT CREATININE: Creatinine, Ser: 0.7 mg/dL (ref 0.44–1.00)

## 2016-06-03 MED ORDER — IOPAMIDOL (ISOVUE-300) INJECTION 61%
100.0000 mL | Freq: Once | INTRAVENOUS | Status: AC | PRN
  Administered 2016-06-03: 100 mL via INTRAVENOUS

## 2016-06-14 ENCOUNTER — Other Ambulatory Visit: Payer: Self-pay | Admitting: Gastroenterology

## 2016-06-14 DIAGNOSIS — K7460 Unspecified cirrhosis of liver: Secondary | ICD-10-CM

## 2016-06-17 ENCOUNTER — Ambulatory Visit
Admission: RE | Admit: 2016-06-17 | Discharge: 2016-06-17 | Disposition: A | Discharge status: Home / Self Care | Payer: Medicare Other | Source: Ambulatory Visit | Attending: Gastroenterology | Admitting: Gastroenterology

## 2016-06-17 DIAGNOSIS — R932 Abnormal findings on diagnostic imaging of liver and biliary tract: Secondary | ICD-10-CM | POA: Insufficient documentation

## 2016-06-17 DIAGNOSIS — K7460 Unspecified cirrhosis of liver: Secondary | ICD-10-CM | POA: Diagnosis not present

## 2016-06-20 DIAGNOSIS — K7581 Nonalcoholic steatohepatitis (NASH): Secondary | ICD-10-CM | POA: Insufficient documentation

## 2016-06-20 DIAGNOSIS — K7460 Unspecified cirrhosis of liver: Secondary | ICD-10-CM | POA: Insufficient documentation

## 2016-08-02 ENCOUNTER — Encounter: Payer: Self-pay | Admitting: Dietician

## 2016-08-02 ENCOUNTER — Encounter: Payer: Medicare Other | Attending: Gastroenterology | Admitting: Dietician

## 2016-08-02 VITALS — Ht 66.0 in | Wt 270.0 lb

## 2016-08-02 DIAGNOSIS — K7460 Unspecified cirrhosis of liver: Secondary | ICD-10-CM | POA: Diagnosis not present

## 2016-08-02 DIAGNOSIS — M1990 Unspecified osteoarthritis, unspecified site: Secondary | ICD-10-CM | POA: Diagnosis not present

## 2016-08-02 DIAGNOSIS — H3500 Unspecified background retinopathy: Secondary | ICD-10-CM | POA: Insufficient documentation

## 2016-08-02 DIAGNOSIS — E785 Hyperlipidemia, unspecified: Secondary | ICD-10-CM | POA: Insufficient documentation

## 2016-08-02 DIAGNOSIS — N289 Disorder of kidney and ureter, unspecified: Secondary | ICD-10-CM | POA: Insufficient documentation

## 2016-08-02 DIAGNOSIS — E669 Obesity, unspecified: Secondary | ICD-10-CM | POA: Diagnosis not present

## 2016-08-02 DIAGNOSIS — E119 Type 2 diabetes mellitus without complications: Secondary | ICD-10-CM | POA: Insufficient documentation

## 2016-08-02 DIAGNOSIS — K3184 Gastroparesis: Secondary | ICD-10-CM | POA: Diagnosis not present

## 2016-08-02 DIAGNOSIS — Z794 Long term (current) use of insulin: Secondary | ICD-10-CM

## 2016-08-02 DIAGNOSIS — K760 Fatty (change of) liver, not elsewhere classified: Secondary | ICD-10-CM | POA: Insufficient documentation

## 2016-08-02 DIAGNOSIS — I10 Essential (primary) hypertension: Secondary | ICD-10-CM | POA: Insufficient documentation

## 2016-08-02 DIAGNOSIS — Z713 Dietary counseling and surveillance: Secondary | ICD-10-CM | POA: Diagnosis present

## 2016-08-02 DIAGNOSIS — K769 Liver disease, unspecified: Secondary | ICD-10-CM

## 2016-08-02 DIAGNOSIS — E1140 Type 2 diabetes mellitus with diabetic neuropathy, unspecified: Secondary | ICD-10-CM

## 2016-08-02 NOTE — Progress Notes (Signed)
Medical Nutrition Therapy: Visit start time:1340  end time: 1510 Assessment:  Diagnosis: Type 2 diabetes, obesity, fatty liver with cirrhosis Past medical history: hyperlipidemia, hypertension, gastroparesis, retinopathy, nephropathy, osteoarthritis Psychosocial issues/ stress concerns: Patient rates her stress as "high" and indicates "not so well" as to how well she is dealing with her stress Preferred learning method:  . No preference indicated  Current weight: 270 lbs  Height: 66 in Medications, supplements: see list Progress and evaluation:  Patient in for initial medical nutrition therapy assessment. She states that her primary diet concerns relate to gastroparesis, diverticulitis and diabetes. She states she is presently on Augmentin due to episode of diverticulitis last week.  She reports that her last HgA1c was in May and was 7.5 but reports her blood sugars have since increased due to cortisone injections for back pain as well as diverticulitis. She reports glucose readings in the 200's-300's. She reports that her meats are mainly chicken and occasional hamburger patties and reports food allergies to fish and eggs. She eats peanut butter frequently.  She does not add salt in cooking or at the table. Her diet is low in fruits and vegetables.  She reports that she cannot afford many of the foods recommended.   Physical activity: unable to exercise due to back pain and overall joint pain.  Dietary Intake:  Usual eating pattern includes 3 meals and 1-2 snacks per day. Dining out frequency: very rarely  Breakfast: 8:00am- peanut butter/diet jelly, 2% milk Lunch: 1:00pm- ham/cheese sandwich, water Snack: dry cereal Supper: peanut butter/jelly sandwich or chicken thigh or hamburger pattie with potatoes, occas. Green beans. Beverages: water, milk  Nutrition Care Education:  Diabetes:  Instructed on a meal plan based on 1500 calories to promote weight loss  including a review of  carbohydrate sources and serving sizes and the need to balance meals with lean protein, 2-3 servings of carbohydrate and soft cooked non-starchy vegetables. Encouraged a canned or soft cooked fruit at lunch and dinner as part of the carbohydrate. Gave and reviewed menus. Gastroparesis: Gave and reviewed foods foods allowed and those to avoid in order to limit fiber from vegetables, fruits and whole grains and fibrous meats. Encouraged to include vegetables and fruits allowed. Discussed how low fiber diet will also help while healing from diverticulitis. Liver Disease: In addition to above diet principles, encouraged to limit sodium intake with no more than 2000 mg sodium daily.  Nutritional Diagnosis:  NI-5.11.1 Predicted suboptimal nutrient intake As related to very low intake of fruits and vegetables.  As evidenced by diet history..  Intervention:  Balance meals with a protein food (soft cooked chicken, low-fat cheese, peanut butter), 1-2 starch servings (mashed potatoes, soft cooked sweet potatoes, pasta, white bread, rice) plus canned or soft cooked fruits. Add a non-starchy vegetable when possible (soft cooked yellow squash, cooked carrots, beets or asparagus). Refer to Hand-out "Planning a Balanced Meal" to help with menu planning and menu example.  If not able to eat a meal, add sugar-free Carnation Instant Breakfast to 1 cup of milk.  Follow dietary guidelines given and instructed on  for gastroparesis.  Include at least 5-6 oz. of protein foods in addition to 3 (8oz) cups of milk daily to meet protein and calcium needs. Education Materials given:  . General diet guidelines for Diabetes . General diet guidelines gastroparesis . Food lists/ Planning A Balanced Meal . Sample meal pattern/ menus . Goals/ instructions Learner/ who was taught:  . Patient  Level of understanding: Marland Kitchen Verbalizes  understanding of instruction Learning barriers: . None  Willingness to learn/ readiness for  change: . Acceptance, ready for change  Monitoring and Evaluation:  Patient did not schedule a follow-up appointment. Gave her my name and number and encouraged her to call if desires further help with her diet/nutrition.  Encouraged patient to contact her endocrinologist regarding elevated glucose numbers.

## 2016-08-02 NOTE — Patient Instructions (Addendum)
Balance meals with a protein food (soft cooked chicken, low-fat cheese, peanut butter), 1-2 starch servings (mashed potatoes, soft cooked sweet potatoes, pasta, white bread, rice) plus canned or soft cooked fruits. Add a non-starchy vegetable when possible (soft cooked yellow squash, cooked carrots, beets or asparagus). Refer to Hand-out "Planning a Balanced Meal" to help with menu planning and menu example.  If not able to eat a meal, add sugar-free Carnation Instant Breakfast to 1 cup of milk.  Follow dietary guidelines given and instructed on  for gastroparesis.  Include at least 5-6 oz. of protein foods in addition to 3 (8oz) cups of milk daily to meet protein and calcium needs.

## 2016-08-31 DIAGNOSIS — R918 Other nonspecific abnormal finding of lung field: Secondary | ICD-10-CM | POA: Insufficient documentation

## 2016-09-08 DIAGNOSIS — M5416 Radiculopathy, lumbar region: Secondary | ICD-10-CM

## 2016-09-08 DIAGNOSIS — M4310 Spondylolisthesis, site unspecified: Secondary | ICD-10-CM | POA: Insufficient documentation

## 2016-09-08 DIAGNOSIS — M48061 Spinal stenosis, lumbar region without neurogenic claudication: Secondary | ICD-10-CM | POA: Insufficient documentation

## 2016-09-17 ENCOUNTER — Encounter: Payer: Self-pay | Admitting: *Deleted

## 2016-09-20 ENCOUNTER — Ambulatory Visit
Admission: RE | Admit: 2016-09-20 | Discharge: 2016-09-20 | Disposition: A | Discharge status: Home / Self Care | Payer: Medicare Other | Source: Ambulatory Visit | Attending: Gastroenterology | Admitting: Gastroenterology

## 2016-09-20 ENCOUNTER — Ambulatory Visit: Payer: Medicare Other | Admitting: Anesthesiology

## 2016-09-20 ENCOUNTER — Encounter
Admission: RE | Disposition: A | Discharge status: Home / Self Care | Payer: Self-pay | Source: Ambulatory Visit | Attending: Gastroenterology

## 2016-09-20 ENCOUNTER — Encounter: Payer: Self-pay | Admitting: *Deleted

## 2016-09-20 DIAGNOSIS — I739 Peripheral vascular disease, unspecified: Secondary | ICD-10-CM | POA: Diagnosis not present

## 2016-09-20 DIAGNOSIS — Z79899 Other long term (current) drug therapy: Secondary | ICD-10-CM | POA: Diagnosis not present

## 2016-09-20 DIAGNOSIS — K2950 Unspecified chronic gastritis without bleeding: Secondary | ICD-10-CM | POA: Insufficient documentation

## 2016-09-20 DIAGNOSIS — Z888 Allergy status to other drugs, medicaments and biological substances status: Secondary | ICD-10-CM | POA: Diagnosis not present

## 2016-09-20 DIAGNOSIS — K2970 Gastritis, unspecified, without bleeding: Secondary | ICD-10-CM | POA: Insufficient documentation

## 2016-09-20 DIAGNOSIS — R1084 Generalized abdominal pain: Secondary | ICD-10-CM | POA: Diagnosis present

## 2016-09-20 DIAGNOSIS — I10 Essential (primary) hypertension: Secondary | ICD-10-CM | POA: Diagnosis not present

## 2016-09-20 DIAGNOSIS — Z5309 Procedure and treatment not carried out because of other contraindication: Secondary | ICD-10-CM | POA: Insufficient documentation

## 2016-09-20 DIAGNOSIS — E1121 Type 2 diabetes mellitus with diabetic nephropathy: Secondary | ICD-10-CM | POA: Insufficient documentation

## 2016-09-20 DIAGNOSIS — Z85820 Personal history of malignant melanoma of skin: Secondary | ICD-10-CM | POA: Diagnosis not present

## 2016-09-20 DIAGNOSIS — K5730 Diverticulosis of large intestine without perforation or abscess without bleeding: Secondary | ICD-10-CM | POA: Insufficient documentation

## 2016-09-20 DIAGNOSIS — J45909 Unspecified asthma, uncomplicated: Secondary | ICD-10-CM | POA: Insufficient documentation

## 2016-09-20 DIAGNOSIS — M5430 Sciatica, unspecified side: Secondary | ICD-10-CM | POA: Insufficient documentation

## 2016-09-20 DIAGNOSIS — R194 Change in bowel habit: Secondary | ICD-10-CM | POA: Insufficient documentation

## 2016-09-20 DIAGNOSIS — G4730 Sleep apnea, unspecified: Secondary | ICD-10-CM | POA: Diagnosis not present

## 2016-09-20 DIAGNOSIS — Z91048 Other nonmedicinal substance allergy status: Secondary | ICD-10-CM | POA: Insufficient documentation

## 2016-09-20 DIAGNOSIS — K5900 Constipation, unspecified: Secondary | ICD-10-CM | POA: Insufficient documentation

## 2016-09-20 DIAGNOSIS — E1143 Type 2 diabetes mellitus with diabetic autonomic (poly)neuropathy: Secondary | ICD-10-CM | POA: Insufficient documentation

## 2016-09-20 DIAGNOSIS — M359 Systemic involvement of connective tissue, unspecified: Secondary | ICD-10-CM | POA: Diagnosis not present

## 2016-09-20 DIAGNOSIS — Z6841 Body Mass Index (BMI) 40.0 and over, adult: Secondary | ICD-10-CM | POA: Diagnosis not present

## 2016-09-20 DIAGNOSIS — Z91012 Allergy to eggs: Secondary | ICD-10-CM | POA: Diagnosis not present

## 2016-09-20 DIAGNOSIS — K3184 Gastroparesis: Secondary | ICD-10-CM | POA: Insufficient documentation

## 2016-09-20 DIAGNOSIS — M1990 Unspecified osteoarthritis, unspecified site: Secondary | ICD-10-CM | POA: Insufficient documentation

## 2016-09-20 DIAGNOSIS — Z91013 Allergy to seafood: Secondary | ICD-10-CM | POA: Diagnosis not present

## 2016-09-20 DIAGNOSIS — K7460 Unspecified cirrhosis of liver: Secondary | ICD-10-CM | POA: Insufficient documentation

## 2016-09-20 DIAGNOSIS — K317 Polyp of stomach and duodenum: Secondary | ICD-10-CM | POA: Insufficient documentation

## 2016-09-20 DIAGNOSIS — Z794 Long term (current) use of insulin: Secondary | ICD-10-CM | POA: Diagnosis not present

## 2016-09-20 DIAGNOSIS — E785 Hyperlipidemia, unspecified: Secondary | ICD-10-CM | POA: Insufficient documentation

## 2016-09-20 HISTORY — DX: Diabetes mellitus due to underlying condition with diabetic nephropathy: E08.21

## 2016-09-20 HISTORY — DX: Body Mass Index (BMI) 40.0 and over, adult: Z684

## 2016-09-20 HISTORY — DX: Small kidney, unilateral: N27.0

## 2016-09-20 HISTORY — DX: Sleep apnea, unspecified: G47.30

## 2016-09-20 HISTORY — DX: Gastroparesis: K31.84

## 2016-09-20 HISTORY — PX: COLONOSCOPY WITH PROPOFOL: SHX5780

## 2016-09-20 HISTORY — PX: ESOPHAGOGASTRODUODENOSCOPY (EGD) WITH PROPOFOL: SHX5813

## 2016-09-20 HISTORY — DX: Unspecified osteoarthritis, unspecified site: M19.90

## 2016-09-20 HISTORY — DX: Sciatica, unspecified side: M54.30

## 2016-09-20 HISTORY — DX: Peripheral vascular disease, unspecified: I73.9

## 2016-09-20 HISTORY — DX: Hyperlipidemia, unspecified: E78.5

## 2016-09-20 HISTORY — DX: Morbid (severe) obesity due to excess calories: E66.01

## 2016-09-20 HISTORY — DX: Other specified diabetes mellitus with mild nonproliferative diabetic retinopathy without macular edema, unspecified eye: E13.3299

## 2016-09-20 HISTORY — DX: Unspecified cirrhosis of liver: K74.60

## 2016-09-20 HISTORY — DX: Calculus of kidney: N20.0

## 2016-09-20 LAB — CBC
HCT: 37.6 % (ref 35.0–47.0)
Hemoglobin: 12 g/dL (ref 12.0–16.0)
MCH: 24.9 pg — AB (ref 26.0–34.0)
MCHC: 32 g/dL (ref 32.0–36.0)
MCV: 77.8 fL — ABNORMAL LOW (ref 80.0–100.0)
PLATELETS: 249 10*3/uL (ref 150–440)
RBC: 4.83 MIL/uL (ref 3.80–5.20)
RDW: 15.8 % — ABNORMAL HIGH (ref 11.5–14.5)
WBC: 9.5 10*3/uL (ref 3.6–11.0)

## 2016-09-20 LAB — PROTIME-INR
INR: 0.95
PROTHROMBIN TIME: 12.7 s (ref 11.4–15.2)

## 2016-09-20 LAB — GLUCOSE, CAPILLARY: Glucose-Capillary: 186 mg/dL — ABNORMAL HIGH (ref 65–99)

## 2016-09-20 SURGERY — ESOPHAGOGASTRODUODENOSCOPY (EGD) WITH PROPOFOL
Anesthesia: General

## 2016-09-20 MED ORDER — SODIUM CHLORIDE 0.9 % IV SOLN
INTRAVENOUS | Status: DC
  Administered 2016-09-20 (×2): via INTRAVENOUS

## 2016-09-20 MED ORDER — CLONIDINE HCL 0.2 MG/24HR TD PTWK
0.2000 mg | MEDICATED_PATCH | TRANSDERMAL | Status: DC
  Administered 2016-09-20: 0.2 mg via TRANSDERMAL
  Filled 2016-09-20: qty 1

## 2016-09-20 MED ORDER — PROPOFOL 10 MG/ML IV BOLUS
INTRAVENOUS | Status: DC | PRN
  Administered 2016-09-20 (×2): 20 mg via INTRAVENOUS
  Administered 2016-09-20 (×2): 30 mg via INTRAVENOUS
  Administered 2016-09-20: 50 mg via INTRAVENOUS

## 2016-09-20 MED ORDER — SODIUM CHLORIDE 0.9 % IV SOLN
2.0000 g | Freq: Once | INTRAVENOUS | Status: AC
  Administered 2016-09-20: 2 g via INTRAVENOUS
  Filled 2016-09-20: qty 2000

## 2016-09-20 MED ORDER — SODIUM CHLORIDE 0.9 % IV SOLN: INTRAVENOUS | Status: DC

## 2016-09-20 MED ORDER — MIDAZOLAM HCL 2 MG/2ML IJ SOLN
INTRAMUSCULAR | Status: DC | PRN
  Administered 2016-09-20: 1 mg via INTRAVENOUS

## 2016-09-20 MED ORDER — FENTANYL CITRATE (PF) 100 MCG/2ML IJ SOLN
INTRAMUSCULAR | Status: DC | PRN
  Administered 2016-09-20: 50 ug via INTRAVENOUS

## 2016-09-20 MED ORDER — PROPOFOL 500 MG/50ML IV EMUL
INTRAVENOUS | Status: DC | PRN
  Administered 2016-09-20: 125 ug/kg/min via INTRAVENOUS

## 2016-09-20 MED ORDER — ATENOLOL 25 MG PO TABS
100.0000 mg | ORAL_TABLET | Freq: Once | ORAL | Status: AC
  Administered 2016-09-20: 100 mg via ORAL

## 2016-09-20 NOTE — Anesthesia Postprocedure Evaluation (Signed)
Anesthesia Post Note  Patient: Brittney [name]  Procedure(s) Performed: Procedure(s) (LRB): ESOPHAGOGASTRODUODENOSCOPY (EGD) WITH PROPOFOL (N/A) COLONOSCOPY WITH PROPOFOL (N/A)  Patient location during evaluation: Endoscopy Anesthesia Type: General Level of consciousness: awake and alert and oriented Pain management: pain level controlled Vital Signs Assessment: post-procedure vital signs reviewed and stable Respiratory status: spontaneous breathing, nonlabored ventilation and respiratory function stable Cardiovascular status: blood pressure returned to baseline and stable Postop Assessment: no signs of nausea or vomiting Anesthetic complications: no    Last Vitals:  Vitals:   09/20/16 1030 09/20/16 1040  BP: (!) 138/54 (!) 148/64  Pulse:    Resp:    Temp:      Last Pain:  Vitals:   09/20/16 1020  TempSrc: Oral  PainSc:                  [name]

## 2016-09-20 NOTE — H&P (Signed)
Outpatient short stay form Pre-procedure 09/20/2016 9:09 AM Brittney Sails MD  Primary Physician: Dr. Emily [name]  Reason for visit:  EGD and colonoscopy  History of present illness:  Patient is a 65 year old female presenting as above. She has complained of abdominal pain and dyspepsia. There've been some bowel habit changes with alternating between loose and constipation. She has had mostly right lower quadrant and left lower quadrant discomfort. She does take a daily NSAID. However she also takes a daily proton pump inhibitor. The timing on that has been a bit out of the proper timing for absorption. We discussed that today. She tolerated her prep well. She denies use of any aspirin products or blood thinning agents. She carries recent diagnosis of cirrhosis found on imaging. Her pro time and platelet count was good this morning/normal. She has had difficulty on a previous colonoscopy however is possibly due to poor sedation.  Current Facility-Administered Medications:  .  0.9 %  sodium chloride infusion, , Intravenous, Continuous, Brittney Sails, MD, Last Rate: 20 mL/hr at 09/20/16 0803 .  0.9 %  sodium chloride infusion, , Intravenous, Continuous, Brittney Sails, MD .  cloNIDine (CATAPRES - Dosed in mg/24 hr) patch 0.2 mg, 0.2 mg, Transdermal, Weekly, Amy Penwarden, MD, 0.2 mg at 09/20/16 [id number]  Prescriptions Prior to Admission  Medication Sig Dispense Refill Last Dose  . albuterol (VENTOLIN HFA) 108 (90 Base) MCG/ACT inhaler TAKE 2 PUFFS BY MOUTH 3 TIMES A DAY AS NEEDED   09/20/2016 at Unknown time  . ALPRAZolam (XANAX) 0.5 MG tablet Take 0.5 mg by mouth 5 (five) times daily.   Past Week at Unknown time  . Butalbital-APAP-Caffeine 50-325-40 MG capsule Take by mouth.   Past Week at Unknown time  . cloNIDine (CATAPRES - DOSED IN MG/24 HR) 0.2 mg/24hr patch 0.3 patches. Frequency:1XW   Dosage:0.0     Instructions:  Note:Dose: 0.2MG /24H   Past Week at Unknown time  . cyclobenzaprine  (FLEXERIL) 10 MG tablet Take 10 mg by mouth.   Past Week at Unknown time  . diclofenac sodium (VOLTAREN) 1 % GEL Apply topically.   Past Week at Unknown time  . Multiple Vitamin (MULTIVITAMIN) LIQD Take by mouth.   Past Week at Unknown time  . atenolol (TENORMIN) 100 MG tablet Take 100 mg by mouth.   09/18/2016 at 2100  . doxazosin (CARDURA) 4 MG tablet Take 4 mg by mouth.   09/18/2016 at 2100  . escitalopram (LEXAPRO) 10 MG tablet Take 10 mg by mouth.   09/18/2016  . ESTRACE VAGINAL 0.1 MG/GM vaginal cream PLACE 1 G VAGINALLY ONCE DAILY.  11 09/18/2016  . fluticasone (FLONASE) 50 MCG/ACT nasal spray Place into the nose.   09/18/2016  . gabapentin (NEURONTIN) 600 MG tablet Take 600 mg by mouth.   09/18/2016  . glimepiride (AMARYL) 4 MG tablet Take by mouth.   09/18/2016  . hydrALAZINE (APRESOLINE) 25 MG tablet Take 50 mg by mouth.   09/18/2016  . hydrochlorothiazide (HYDRODIURIL) 25 MG tablet Take by mouth.   09/06/2016  . ibuprofen (ADVIL,MOTRIN) 800 MG tablet Take 800 mg by mouth.   09/13/2016  . Insulin Detemir (LEVEMIR FLEXTOUCH) 100 UNIT/ML Pen Inject into the skin.   09/18/2016  . Insulin Pen Needle (NOVOFINE) 32G X 6 MM MISC 1 NEEDLE FOUR TIMES A DAY AS DIRECTED   09/18/2016  . insulin regular human CONCENTRATED (HUMULIN R) 500 UNIT/ML kwikpen Inject into the skin.   09/18/2016  . lisinopril (PRINIVIL,ZESTRIL) 10 MG  tablet Take 10 mg by mouth daily.   09/18/2016  . metFORMIN (GLUCOPHAGE-XR) 500 MG 24 hr tablet Take 500 mg by mouth.   Taking  . metoCLOPramide (REGLAN) 10 MG tablet Take 10 mg by mouth 3 (three) times daily.   09/18/2016  . mometasone (ELOCON) 0.1 % lotion APPLY TWICE A DAY   09/18/2016  . ondansetron (ZOFRAN) 4 MG tablet Take by mouth.   09/18/2016  . pantoprazole (PROTONIX) 40 MG tablet TAKE 1 TABLET (40 MG TOTAL) BY MOUTH 2 (TWO) TIMES DAILY. TAKE ONE HOUR BEFORE A MEAL   09/18/2016  . potassium chloride (K-DUR,KLOR-CON) 10 MEQ tablet TAKE 1 TABLET BY MOUTH ONCE A DAY   09/16/2016  .  pramipexole (MIRAPEX) 0.125 MG tablet Take 0.125 mg by mouth.   09/18/2016  . torsemide (DEMADEX) 20 MG tablet Take 20 mg by mouth.   09/13/2016  . traMADol (ULTRAM) 50 MG tablet Take 50 mg by mouth.   09/18/2016  . traZODone (DESYREL) 150 MG tablet Take 150 mg by mouth.   09/18/2016  . zolpidem (AMBIEN) 10 MG tablet Take 10 mg by mouth.   09/18/2016     Allergies  Allergen Reactions  . Amlodipine Cough  . Imdur [Isosorbide Dinitrate] Other (See Comments)    Headache  . Isosorbide Mononitrate [Isosorbide Dinitrate Er] Other (See Comments)    Headache  . Lyrica [Pregabalin] Other (See Comments) and Swelling    "FLU-LIKE" SYMPTOMS  . Monosodium Glutamate Other (See Comments)    Headache  . Norvasc [Amlodipine Besylate] Cough  . Shellfish Allergy Nausea And Vomiting  . Tape Other (See Comments)    can use paper tape  . Eggs Or Egg-Derived Products Nausea And Vomiting  . Propoxyphene Nausea And Vomiting     Past Medical History:  Diagnosis Date  . Arthritis   . Asthma   . Cancer ([place])    melanoma  . Collagen vascular disease ([place])   . Diabetes mellitus without complication ([place])   . Diabetic nephropathy associated with secondary diabetes mellitus ([place])   . Gastroparesis   . Hepatic cirrhosis ([place])   . Hyperlipidemia   . Hypertension   . Morbid obesity with BMI of 40.0-44.9, adult ([place])   . Nephrolithiasis   . Nonproliferative retinopathy due to secondary diabetes ([place])   . Peripheral vascular disease ([place])   . Sciatica   . Sleep apnea   . Unilateral small kidney without contralateral hypertrophy     Review of systems:      Physical Exam    Heart and lungs: Irregular rate and rhythm without rub or gallop, lungs are bilaterally clear.    HEENT: Normocephalic atraumatic eyes are anicteric    Other:     Pertinant exam for procedure: Soft, obese, nontender distended. Bowel sounds are positive normoactive. There is a generalized discomfort.     Planned proceedures:  EGD, colonoscopy and indicated procedures.    Brittney Sails, MD Gastroenterology 09/20/2016  9:09 AM

## 2016-09-20 NOTE — Op Note (Signed)
[facility] Gastroenterology Patient Name: Brittney [name] Procedure Date: 09/20/2016 9:08 AM MRN: [id number] Account #: [id number] Date of Birth: 10-28-1951 Admit Type: Outpatient Age: 65 Room: [facility] ENDO ROOM 3 Gender: Female Note Status: Finalized Procedure:            Upper GI endoscopy Indications:          Abdominal pain in the right lower quadrant, Abdominal                        pain in the left lower quadrant, Generalized abdominal                        pain, Functional Dyspepsia, Diarrhea Providers:            Lollie Sails, MD Referring MD:         Rusty Aus, MD (Referring MD) Medicines:            Monitored Anesthesia Care Complications:        No immediate complications. Procedure:            Pre-Anesthesia Assessment:                       - ASA Grade Assessment: III - A patient with severe                        systemic disease.                       After obtaining informed consent, the endoscope was                        passed under direct vision. Throughout the procedure,                        the patient's blood pressure, pulse, and oxygen                        saturations were monitored continuously. The Endoscope                        was introduced through the mouth, and advanced to the                        third part of duodenum. The patient tolerated the                        procedure well. Findings:      The Z-line was variable. Biopsies were taken with a cold forceps for       histology.      The exam of the esophagus was otherwise normal.      Diffuse mild inflammation characterized by erythema, friability and       granularity was found in the gastric body. Biopsies were taken with a       cold forceps for histology. Biopsies were taken with a cold forceps for       Helicobacter pylori testing.      Diffuse mild mucosal variance characterized by altered texture was found       in the second portion of the duodenum  and in the third portion of the  duodenum. Biopsies were taken with a cold forceps for histology.      Multiple 1 to 4 mm sessile polyps with no bleeding and no stigmata of       recent bleeding were found in the gastric body. Biopsies were taken with       a cold forceps for histology. Impression:           - Z-line variable. Biopsied.                       - Gastritis. Biopsied.                       - Mucosal variant in the duodenum. Biopsied. Recommendation:       - Perform a colonoscopy today.                       - Continue present medications.                       - Await pathology results.                       - Return to GI clinic in 1 month. Procedure Code(s):    --- Professional ---                       [phone], Esophagogastroduodenoscopy, flexible, transoral;                        with biopsy, single or multiple Diagnosis Code(s):    --- Professional ---                       K22.8, Other specified diseases of esophagus                       K29.70, Gastritis, unspecified, without bleeding                       K31.89, Other diseases of stomach and duodenum                       R10.31, Right lower quadrant pain                       R10.32, Left lower quadrant pain                       R10.84, Generalized abdominal pain                       K30, Functional dyspepsia                       R19.7, Diarrhea, unspecified CPT copyright 2016 American Medical Association. All rights reserved. The codes documented in this report are preliminary and upon coder review may  be revised to meet current compliance requirements. Lollie Sails, MD 09/20/2016 9:46:33 AM This report has been signed electronically. Number of Addenda: 0 Note Initiated On: 09/20/2016 9:08 AM      [facility]

## 2016-09-20 NOTE — Transfer of Care (Signed)
Immediate Anesthesia Transfer of Care Note  Patient: Brittney [name]  Procedure(s) Performed: Procedure(s): ESOPHAGOGASTRODUODENOSCOPY (EGD) WITH PROPOFOL (N/A) COLONOSCOPY WITH PROPOFOL (N/A)  Patient Location: PACU  Anesthesia Type:General  Level of Consciousness: awake, alert  and oriented  Airway & Oxygen Therapy: Patient Spontanous Breathing and Patient connected to nasal cannula oxygen  Post-op Assessment: Report given to RN and Post -op Vital signs reviewed and stable  Post vital signs: Reviewed and stable  Last Vitals:  Vitals:   09/20/16 0707  BP: (!) 208/87  Pulse: 100  Resp: 16  Temp: 36.9 C    Last Pain:  Vitals:   09/20/16 0707  TempSrc: Tympanic  PainSc: 8       Patients Stated Pain Goal: 5 ([id number])  Complications: No apparent anesthesia complications

## 2016-09-20 NOTE — Anesthesia Preprocedure Evaluation (Signed)
Anesthesia Evaluation  Patient identified by MRN, date of birth, ID band Patient awake    Reviewed: Allergy & Precautions, NPO status , Patient's Chart, lab work & pertinent test results, reviewed documented beta blocker date and time   History of Anesthesia Complications Negative for: history of anesthetic complications  Airway Mallampati: II  TM Distance: >3 FB Neck ROM: Full    Dental  (+) Partial Lower, Poor Dentition   Pulmonary asthma (mild intermittent, uses albuterol ~2x per week) , sleep apnea (not using CPAP) ,    breath sounds clear to auscultation- rhonchi (-) wheezing      Cardiovascular hypertension, Pt. on medications and Pt. on home beta blockers + Peripheral Vascular Disease  (-) CAD and (-) Past MI  Rhythm:Regular Rate:Normal - Systolic murmurs and - Diastolic murmurs    Neuro/Psych negative psych ROS   GI/Hepatic negative GI ROS, (+) Cirrhosis: NAFLD.      ,   Endo/Other  diabetes, Insulin Dependent  Renal/GU negative Renal ROS     Musculoskeletal  (+) Arthritis ,   Abdominal (+) + obese,   Peds  Hematology negative hematology ROS (+)   Anesthesia Other Findings Past Medical History: No date: Arthritis No date: Asthma No date: Cancer ([place])     Comment: melanoma No date: Collagen vascular disease ([facility]) No date: Diabetes mellitus without complication (H[facility]) No date: Diabetic nephropathy associated with secondary* No date: Gastroparesis No date: Hepatic cirrhosis ([facility]) No date: Hyperlipidemia No date: Hypertension No date: Morbid obesity with BMI of 40.0-44.9, adult ([facility]* No date: Nephrolithiasis No date: Nonproliferative retinopathy due to secondary * No date: Peripheral vascular disease (H[facility]) No date: Sciatica No date: Sleep apnea No date: Unilateral small kidney without contralateral *   Reproductive/Obstetrics                             Anesthesia  Physical Anesthesia Plan  ASA: III  Anesthesia Plan: General   Post-op Pain Management:    Induction: Intravenous  Airway Management Planned: Natural Airway  Additional Equipment:   Intra-op Plan:   Post-operative Plan:   Informed Consent: I have reviewed the patients History and Physical, chart, labs and discussed the procedure including the risks, benefits and alternatives for the proposed anesthesia with the patient or authorized representative who has indicated his/her understanding and acceptance.   Dental advisory given  Plan Discussed with: CRNA and Anesthesiologist  Anesthesia Plan Comments:         Anesthesia Quick Evaluation

## 2016-09-20 NOTE — Anesthesia Procedure Notes (Signed)
Date/Time: 09/20/2016 9:32 AM Performed by: Nelda Marseille Pre-anesthesia Checklist: Patient identified, Emergency Drugs available, Suction available, Patient being monitored and Timeout performed Oxygen Delivery Method: Nasal cannula

## 2016-09-20 NOTE — Op Note (Signed)
[facility] Gastroenterology Patient Name: Brittney [name] Procedure Date: 09/20/2016 9:08 AM MRN: [id number] Account #: [id number] Date of Birth: September 16, 1951 Admit Type: Outpatient Age: 65 Room: [facility] ENDO ROOM 3 Gender: Female Note Status: Finalized Procedure:            Colonoscopy Indications:          Generalized abdominal pain, Abdominal pain in the left                        lower quadrant, Abdominal pain in the right lower                        quadrant, Change in bowel habits Providers:            Lollie Sails, MD Referring MD:         Rusty Aus, MD (Referring MD) Complications:        No immediate complications. Procedure:            Pre-Anesthesia Assessment:                       - ASA Grade Assessment: III - A patient with severe                        systemic disease.                       After obtaining informed consent, the colonoscope was                        passed under direct vision. Throughout the procedure,                        the patient's blood pressure, pulse, and oxygen                        saturations were monitored continuously. The                        Colonoscope was introduced through the anus with the                        intention of advancing to the cecum. The scope was                        advanced to the sigmoid colon before the procedure was                        aborted. Medications were given. The colonoscopy was                        extremely difficult due to restricted mobility of the                        colon and a tortuous colon. [Solution]. The patient                        tolerated the procedure well. The quality of the bowel  preparation was good. Findings:      Multiple small to medium-mouthed diverticula were found in the mid       sigmoid colon and distal sigmoid colon. At about 40-45 cm there is an       abnormal tethering which I was unable to pass despite  position change       and abdominal support.      The digital rectal exam was normal. Impression:           - Diverticulosis in the mid sigmoid colon and in the                        distal sigmoid colon.                       - No specimens collected. Recommendation:       - Perform an air contrast barium enema at appointment                        to be scheduled. Procedure Code(s):    --- Professional ---                       [phone], 53, Colonoscopy, flexible; diagnostic, including                        collection of specimen(s) by brushing or washing, when                        performed (separate procedure) Diagnosis Code(s):    --- Professional ---                       R10.84, Generalized abdominal pain                       R10.32, Left lower quadrant pain                       R10.31, Right lower quadrant pain                       R19.4, Change in bowel habit                       K57.30, Diverticulosis of large intestine without                        perforation or abscess without bleeding CPT copyright 2016 American Medical Association. All rights reserved. The codes documented in this report are preliminary and upon coder review may  be revised to meet current compliance requirements. Lollie Sails, MD 09/20/2016 10:28:11 AM This report has been signed electronically. Number of Addenda: 0 Note Initiated On: 09/20/2016 9:08 AM Total Procedure Duration: 0 hours 28 minutes 37 seconds       [facility]

## 2016-09-21 ENCOUNTER — Encounter: Payer: Self-pay | Admitting: Gastroenterology

## 2016-09-21 LAB — SURGICAL PATHOLOGY

## 2016-10-22 DIAGNOSIS — M159 Polyosteoarthritis, unspecified: Secondary | ICD-10-CM | POA: Insufficient documentation

## 2016-11-04 ENCOUNTER — Other Ambulatory Visit: Payer: Self-pay | Admitting: Gastroenterology

## 2016-11-04 DIAGNOSIS — K7460 Unspecified cirrhosis of liver: Secondary | ICD-10-CM

## 2016-11-11 ENCOUNTER — Ambulatory Visit
Admission: RE | Admit: 2016-11-11 | Discharge: 2016-11-11 | Disposition: A | Discharge status: Home / Self Care | Payer: Medicare Other | Source: Ambulatory Visit | Attending: Gastroenterology | Admitting: Gastroenterology

## 2016-11-11 DIAGNOSIS — Z9049 Acquired absence of other specified parts of digestive tract: Secondary | ICD-10-CM | POA: Diagnosis not present

## 2016-11-11 DIAGNOSIS — K7460 Unspecified cirrhosis of liver: Secondary | ICD-10-CM

## 2016-11-17 ENCOUNTER — Other Ambulatory Visit: Payer: Self-pay | Admitting: Gastroenterology

## 2016-11-17 DIAGNOSIS — K5730 Diverticulosis of large intestine without perforation or abscess without bleeding: Secondary | ICD-10-CM

## 2016-11-21 ENCOUNTER — Encounter: Payer: Self-pay | Admitting: Emergency Medicine

## 2016-11-21 ENCOUNTER — Emergency Department
Admission: EM | Admit: 2016-11-21 | Discharge: 2016-11-21 | Disposition: A | Discharge status: Home / Self Care | Payer: Medicare Other | Attending: Student in an Organized Health Care Education/Training Program | Admitting: Student in an Organized Health Care Education/Training Program

## 2016-11-21 DIAGNOSIS — E119 Type 2 diabetes mellitus without complications: Secondary | ICD-10-CM | POA: Diagnosis not present

## 2016-11-21 DIAGNOSIS — N390 Urinary tract infection, site not specified: Secondary | ICD-10-CM

## 2016-11-21 DIAGNOSIS — Z85828 Personal history of other malignant neoplasm of skin: Secondary | ICD-10-CM | POA: Diagnosis not present

## 2016-11-21 DIAGNOSIS — I10 Essential (primary) hypertension: Secondary | ICD-10-CM | POA: Insufficient documentation

## 2016-11-21 DIAGNOSIS — Z791 Long term (current) use of non-steroidal anti-inflammatories (NSAID): Secondary | ICD-10-CM | POA: Diagnosis not present

## 2016-11-21 DIAGNOSIS — Z79899 Other long term (current) drug therapy: Secondary | ICD-10-CM | POA: Diagnosis not present

## 2016-11-21 DIAGNOSIS — J45909 Unspecified asthma, uncomplicated: Secondary | ICD-10-CM | POA: Diagnosis not present

## 2016-11-21 DIAGNOSIS — Z794 Long term (current) use of insulin: Secondary | ICD-10-CM | POA: Insufficient documentation

## 2016-11-21 DIAGNOSIS — M545 Low back pain, unspecified: Secondary | ICD-10-CM

## 2016-11-21 DIAGNOSIS — G8929 Other chronic pain: Secondary | ICD-10-CM

## 2016-11-21 LAB — URINALYSIS COMPLETE WITH MICROSCOPIC (ARMC ONLY)
BACTERIA UA: NONE SEEN
BILIRUBIN URINE: NEGATIVE
GLUCOSE, UA: NEGATIVE mg/dL
Hgb urine dipstick: NEGATIVE
Ketones, ur: NEGATIVE mg/dL
NITRITE: NEGATIVE
Protein, ur: 100 mg/dL — AB
SPECIFIC GRAVITY, URINE: 1.016 (ref 1.005–1.030)
pH: 6 (ref 5.0–8.0)

## 2016-11-21 MED ORDER — CYCLOBENZAPRINE HCL 10 MG PO TABS: 10.0000 mg | ORAL_TABLET | Freq: Three times a day (TID) | ORAL | 0 refills | Status: DC | PRN

## 2016-11-21 MED ORDER — OXYCODONE-ACETAMINOPHEN 5-325 MG PO TABS
2.0000 | ORAL_TABLET | Freq: Once | ORAL | Status: AC
  Administered 2016-11-21: 2 via ORAL
  Filled 2016-11-21: qty 2

## 2016-11-21 MED ORDER — OXYCODONE-ACETAMINOPHEN 5-325 MG PO TABS: 1.0000 | ORAL_TABLET | ORAL | 0 refills | Status: DC | PRN

## 2016-11-21 MED ORDER — SULFAMETHOXAZOLE-TRIMETHOPRIM 800-160 MG PO TABS: 1.0000 | ORAL_TABLET | Freq: Two times a day (BID) | ORAL | 0 refills | Status: DC

## 2016-11-21 NOTE — ED Triage Notes (Signed)
Pt c/o chronic lower back pain worse for past 3-4 days. No new injuries. Pain worse with movement.

## 2016-11-21 NOTE — Discharge Instructions (Signed)
Discontinue taking Levaquin at this time. Beginning get taking Septra DS twice a day for 10 days. Increase fluids. Flexeril 10 mg one every 8 hours as needed for muscle spasms. Did not take an extra dose prior to going to bed. Percocet as needed for severe pain. Do not take this with your tramadol. Call Dr. Ammie Ferrier office if any continued problems. There is also a urine culture ordered so that Dr. Sabra Heck can see what bacteria is growing in your urine.

## 2016-11-21 NOTE — ED Provider Notes (Signed)
[facility] Emergency Department Provider Note  ____________________________________________   First MD Initiated Contact with Patient 11/21/16 1101     (approximate)  I have reviewed the triage vital signs and the nursing notes.   HISTORY  Chief Complaint Back Pain   HPI Brittney [name] is a 65 y.o. female is here with complaint of chronic low back pain that is been worse for the last 3-4 days. Patient states that she has not had any new injuries. Patient states that she had back surgery in the past and has continued to have low back pain that radiates into her left leg. Patient is not on any pain medication at this time. Patient sees Dr. Zenia Resides at the rheumatology department at [facility] along with an orthopedist. She denies any urinary symptoms. There is been no incontinence of bowel or bladder. There is no change in her left leg radiculopathy. Patient continues to ambulate without assistance. Patient states that today she was unable to deal with the pain anymore. She rates her pain as a 10 over 10 at this time.   Past Medical History:  Diagnosis Date  . Arthritis   . Asthma   . Cancer ([place]    melanoma  . Collagen vascular disease ([place])   . Diabetes mellitus without complication ([place])   . Diabetic nephropathy associated with secondary diabetes mellitus ([place])   . Gastroparesis   . Hepatic cirrhosis ([place])   . Hyperlipidemia   . Hypertension   . Morbid obesity with BMI of 40.0-44.9, adult ([place])   . Nephrolithiasis   . Nonproliferative retinopathy due to secondary diabetes ([place])   . Peripheral vascular disease ([place])   . Sciatica   . Sleep apnea   . Unilateral small kidney without contralateral hypertrophy     There are no active problems to display for this patient.   Past Surgical History:  Procedure Laterality Date  . ABDOMINAL HYSTERECTOMY    . BACK SURGERY  04/1999   L-4-5 LAMINECTOMY  . CESAREAN SECTION  1980  . COLONOSCOPY  05/04/2001  .  COLONOSCOPY WITH PROPOFOL N/A 09/20/2016   Procedure: COLONOSCOPY WITH PROPOFOL;  Surgeon: Lollie Sails, MD;  Location: [facility] ENDOSCOPY;  Service: Endoscopy;  Laterality: N/A;  . DILATION AND CURETTAGE OF UTERUS    . ESOPHAGOGASTRODUODENOSCOPY  02/08/2014  . ESOPHAGOGASTRODUODENOSCOPY (EGD) WITH PROPOFOL N/A 09/20/2016   Procedure: ESOPHAGOGASTRODUODENOSCOPY (EGD) WITH PROPOFOL;  Surgeon: Lollie Sails, MD;  Location: [facility] ENDOSCOPY;  Service: Endoscopy;  Laterality: N/A;  . EYE SURGERY  1986   FOR MELANOMA  . JOINT REPLACEMENT Left 09/24/2013   TOTAL KNEE  . TONSILLECTOMY     WITH ADENOIDECTOMY    Prior to Admission medications   Medication Sig Start Date End Date Taking? Authorizing Provider  albuterol (VENTOLIN HFA) 108 (90 Base) MCG/ACT inhaler TAKE 2 PUFFS BY MOUTH 3 TIMES A DAY AS NEEDED 05/10/16   Historical Provider, MD  ALPRAZolam Duanne Moron) 0.5 MG tablet Take 0.5 mg by mouth 5 (five) times daily. 12/11/12   Historical Provider, MD  atenolol (TENORMIN) 100 MG tablet Take 100 mg by mouth. 12/11/12   Historical Provider, MD  Butalbital-APAP-Caffeine 50-325-40 MG capsule Take by mouth.    Historical Provider, MD  cloNIDine (CATAPRES - DOSED IN MG/24 HR) 0.2 mg/24hr patch 0.3 patches. Frequency:1XW   Dosage:0.0     Instructions:  Note:Dose: 0.2MG /24H 12/11/12   Historical Provider, MD  cyclobenzaprine (FLEXERIL) 10 MG tablet Take 1 tablet (10 mg total) by mouth 3 (  three) times daily as needed for muscle spasms. 11/21/16   Johnn Hai, PA-C  diclofenac sodium (VOLTAREN) 1 % GEL Apply topically. 09/12/14   Historical Provider, MD  doxazosin (CARDURA) 4 MG tablet Take 4 mg by mouth. 12/11/12   Historical Provider, MD  escitalopram (LEXAPRO) 10 MG tablet Take 10 mg by mouth. 12/13/12   Historical Provider, MD  ESTRACE VAGINAL 0.1 MG/GM vaginal cream PLACE 1 G VAGINALLY ONCE DAILY. 07/22/16   Historical Provider, MD  fluticasone (FLONASE) 50 MCG/ACT nasal spray Place into the nose.  08/20/15   Historical Provider, MD  gabapentin (NEURONTIN) 600 MG tablet Take 600 mg by mouth. 12/13/12   Historical Provider, MD  glimepiride (AMARYL) 4 MG tablet Take by mouth.    Historical Provider, MD  hydrALAZINE (APRESOLINE) 25 MG tablet Take 50 mg by mouth. 03/30/16   Historical Provider, MD  hydrochlorothiazide (HYDRODIURIL) 25 MG tablet Take by mouth.    Historical Provider, MD  ibuprofen (ADVIL,MOTRIN) 800 MG tablet Take 800 mg by mouth. 12/11/12   Historical Provider, MD  Insulin Detemir (LEVEMIR FLEXTOUCH) 100 UNIT/ML Pen Inject into the skin. 01/02/16   Historical Provider, MD  Insulin Pen Needle (NOVOFINE) 32G X 6 MM MISC 1 NEEDLE FOUR TIMES A DAY AS DIRECTED 08/04/15   Historical Provider, MD  insulin regular human CONCENTRATED (HUMULIN R) 500 UNIT/ML kwikpen Inject into the skin. 03/16/16   Historical Provider, MD  lisinopril (PRINIVIL,ZESTRIL) 10 MG tablet Take 10 mg by mouth daily.    Historical Provider, MD  metFORMIN (GLUCOPHAGE-XR) 500 MG 24 hr tablet Take 500 mg by mouth. 09/09/15 09/08/16  Historical Provider, MD  metoCLOPramide (REGLAN) 10 MG tablet Take 10 mg by mouth 3 (three) times daily. 12/11/12   Historical Provider, MD  mometasone (ELOCON) 0.1 % lotion APPLY TWICE A DAY 07/28/15   Historical Provider, MD  Multiple Vitamin (MULTIVITAMIN) LIQD Take by mouth.    Historical Provider, MD  ondansetron (ZOFRAN) 4 MG tablet Take by mouth. 02/05/16   Historical Provider, MD  oxyCODONE-acetaminophen (PERCOCET) 5-325 MG tablet Take 1 tablet by mouth every 4 (four) hours as needed for severe pain. 11/21/16   Johnn Hai, PA-C  pantoprazole (PROTONIX) 40 MG tablet TAKE 1 TABLET (40 MG TOTAL) BY MOUTH 2 (TWO) TIMES DAILY. TAKE ONE HOUR BEFORE A MEAL 08/22/15   Historical Provider, MD  potassium chloride (K-DUR,KLOR-CON) 10 MEQ tablet TAKE 1 TABLET BY MOUTH ONCE A DAY 08/19/14   Historical Provider, MD  pramipexole (MIRAPEX) 0.125 MG tablet Take 0.125 mg by mouth. 12/11/12   Historical  Provider, MD  sulfamethoxazole-trimethoprim (BACTRIM DS,SEPTRA DS) 800-160 MG tablet Take 1 tablet by mouth 2 (two) times daily. 11/21/16   Johnn Hai, PA-C  torsemide (DEMADEX) 20 MG tablet Take 20 mg by mouth.    Historical Provider, MD  traMADol (ULTRAM) 50 MG tablet Take 50 mg by mouth. 12/11/12   Historical Provider, MD  traZODone (DESYREL) 150 MG tablet Take 150 mg by mouth. 12/11/12   Historical Provider, MD  zolpidem (AMBIEN) 10 MG tablet Take 10 mg by mouth. 12/11/12   Historical Provider, MD    Allergies Amlodipine; Imdur [isosorbide dinitrate]; Isosorbide mononitrate [isosorbide dinitrate er]; Lyrica [pregabalin]; Monosodium glutamate; Norvasc [amlodipine besylate]; Shellfish allergy; Tape; Eggs or egg-derived products; and Propoxyphene  Family History  Problem Relation Age of Onset  . Breast cancer Neg Hx     Social History Social History  Substance Use Topics  . Smoking status: Never Smoker  . Smokeless  tobacco: Never Used  . Alcohol use No    Review of Systems Constitutional: No fever/chills Cardiovascular: Denies chest pain. Respiratory: Denies shortness of breath. Gastrointestinal: No abdominal pain.  No nausea, no vomiting.   Genitourinary: Negative for dysuria. Musculoskeletal: Positive for chronic back pain. Skin: Negative for rash. Neurological: Negative for headaches, focal weakness or numbness. Positive left leg radiculopathy chronic.  10-point ROS otherwise negative.  ____________________________________________   PHYSICAL EXAM:  VITAL SIGNS: ED Triage Vitals  Enc Vitals Group     BP 11/21/16 1054 (!) 161/67     Pulse Rate 11/21/16 1054 64     Resp 11/21/16 1054 20     Temp 11/21/16 1054 98.3 F (36.8 C)     Temp Source 11/21/16 1054 Oral     SpO2 11/21/16 1054 96 %     Weight 11/21/16 1052 265 lb (120.2 kg)     Height 11/21/16 1052 5\' 7"  (1.702 m)     Head Circumference --      Peak Flow --      Pain Score 11/21/16 1052 10     Pain  Loc --      Pain Edu? --      Excl. in [place]? --     Constitutional: Alert and oriented. Well appearing and in no acute distress. Eyes: Conjunctivae are normal. PERRL. EOMI. Head: Atraumatic. Nose: No congestion/rhinnorhea. Neck: No stridor.   Cardiovascular: Normal rate, regular rhythm. Grossly normal heart sounds.  Good peripheral circulation. Respiratory: Normal respiratory effort.  No retractions. Lungs CTAB. Gastrointestinal: Soft and nontender. No distention. Bowel sounds are normoactive 4 quadrants. No CVA tenderness present. Musculoskeletal: Examination of the back there is no gross deformity noted. There is diffuse tenderness on palpation of the lumbar spine and paravertebral muscles bilaterally. No active muscle spasms were seen. Patient has good strength in lower extremities. Neurologic:  Normal speech and language. No gross focal neurologic deficits are appreciated. Reflexes were 2+ bilaterally. No gait instability. Skin:  Skin is warm, dry and intact. No rash noted. Psychiatric: Mood and affect are normal. Speech and behavior are normal.  ____________________________________________   LABS (all labs ordered are listed, but only abnormal results are displayed)  Labs Reviewed  URINALYSIS COMPLETEWITH MICROSCOPIC ([place]) - Abnormal; Notable for the following:       Result Value   Color, Urine YELLOW (*)    APPearance CLEAR (*)    Protein, ur 100 (*)    Leukocytes, UA 2+ (*)    Squamous Epithelial / LPF 0-5 (*)    All other components within normal limits  URINE CULTURE     PROCEDURES  Procedure(s) performed: None  Procedures  Critical Care performed: No  ____________________________________________   INITIAL IMPRESSION / ASSESSMENT AND PLAN / ED COURSE  Pertinent labs & imaging results that were available during my care of the patient were reviewed by me and considered in my medical decision making (see chart for details).    Clinical Course     Patient was given oxycodone while in the emergency room which helped with her pain. Patient was made aware that she does have a urinary tract infection and was placed on Bactrim DS. Patient currently was taking Levaquin for the last 6 days which apparently has not helped at all. Urine culture was ordered. Patient is follow-up with her primary care doctor. She is encouraged to drink lots of fluids. She is also given a prescription for Percocet as needed for severe pain and follow-up with  her orthopedist if any continued problems with her back.  ____________________________________________   FINAL CLINICAL IMPRESSION(S) / ED DIAGNOSES  Final diagnoses:  Acute urinary tract infection  Chronic bilateral low back pain without sciatica      NEW MEDICATIONS STARTED DURING THIS VISIT:  Discharge Medication List as of 11/21/2016 12:26 PM    START taking these medications   Details  oxyCODONE-acetaminophen (PERCOCET) 5-325 MG tablet Take 1 tablet by mouth every 4 (four) hours as needed for severe pain., Starting Sun 11/21/2016, Print    sulfamethoxazole-trimethoprim (BACTRIM DS,SEPTRA DS) 800-160 MG tablet Take 1 tablet by mouth 2 (two) times daily., Starting Sun 11/21/2016, Print         Note:  This document was prepared using Dragon voice recognition software and may include unintentional dictation errors.    Johnn Hai, PA-C 11/21/16 1842    Merlyn Lot, MD 11/22/16 [phone]

## 2016-11-21 NOTE — ED Notes (Signed)
See triage note states she developed lower back pain about 3-4 days ago  Hx of same and has had back surgery in past  Pain is lower back and radiates into left leg  Ambulates with sl limp

## 2016-11-23 LAB — URINE CULTURE: SPECIAL REQUESTS: NORMAL

## 2016-11-24 NOTE — Progress Notes (Signed)
ED Antimicrobial Stewardship Positive Culture Follow Up   Brittney [name] is an 64 y.o. female who presented to [facility] on 11/21/2016 with a chief complaint of  Chief Complaint  Patient presents with  . Back Pain    Recent Results (from the past 720 hour(s))  Urine culture     Status: Abnormal   Collection Time: 11/21/16 11:37 AM  Result Value Ref Range Status   Specimen Description URINE, CLEAN CATCH  Final   Special Requests levaquin Normal  Final   Culture 30,000 COLONIES/mL ENTEROCOCCUS FAECALIS (A)  Final   Report Status 11/23/2016 FINAL  Final   Organism ID, Bacteria ENTEROCOCCUS FAECALIS (A)  Final      Susceptibility   Enterococcus faecalis - MIC*    AMPICILLIN <=2 SENSITIVE Sensitive     LEVOFLOXACIN >=8 RESISTANT Resistant     NITROFURANTOIN <=16 SENSITIVE Sensitive     VANCOMYCIN 2 SENSITIVE Sensitive     * 30,000 COLONIES/mL ENTEROCOCCUS FAECALIS    [x]  Treated with SMX/TMP, organism resistant to prescribed antimicrobial   New antibiotic prescription: amoxicillin 500 mg PO BID x 7 days  ED Provider: Dr. Lenise Arena  Spoke with patient and explained culture results. Instructed to stop taking SMX/TMP and begin taking new prescription. Patient denies any issues with taking penicillins in the past. Prescription called into [facility] in [place], [place].   Lenis Noon, PharmD Clinical Pharmacist 11/24/2016, 1:28 PM

## 2016-11-26 HISTORY — PX: MOHS SURGERY: SUR867

## 2016-12-01 ENCOUNTER — Inpatient Hospital Stay
Admission: RE | Admit: 2016-12-01 | Discharge: 2016-12-01 | Disposition: A | Discharge status: Home / Self Care | Payer: Medicare Other | Source: Ambulatory Visit | Attending: Gastroenterology | Admitting: Gastroenterology

## 2016-12-07 ENCOUNTER — Ambulatory Visit: Payer: Medicare Other | Attending: Pain Medicine | Admitting: Pain Medicine

## 2016-12-07 ENCOUNTER — Telehealth: Payer: Self-pay

## 2016-12-07 ENCOUNTER — Other Ambulatory Visit
Admission: RE | Admit: 2016-12-07 | Discharge: 2016-12-07 | Disposition: A | Discharge status: Home / Self Care | Payer: Medicare Other | Source: Ambulatory Visit | Attending: Pain Medicine | Admitting: Pain Medicine

## 2016-12-07 ENCOUNTER — Encounter: Payer: Self-pay | Admitting: Pain Medicine

## 2016-12-07 VITALS — BP 161/65 | HR 73 | Temp 98.2°F | Resp 16 | Ht 66.0 in | Wt 274.0 lb

## 2016-12-07 DIAGNOSIS — M1990 Unspecified osteoarthritis, unspecified site: Secondary | ICD-10-CM | POA: Insufficient documentation

## 2016-12-07 DIAGNOSIS — M533 Sacrococcygeal disorders, not elsewhere classified: Secondary | ICD-10-CM | POA: Diagnosis not present

## 2016-12-07 DIAGNOSIS — M4316 Spondylolisthesis, lumbar region: Secondary | ICD-10-CM | POA: Diagnosis not present

## 2016-12-07 DIAGNOSIS — M461 Sacroiliitis, not elsewhere classified: Secondary | ICD-10-CM | POA: Insufficient documentation

## 2016-12-07 DIAGNOSIS — M5442 Lumbago with sciatica, left side: Secondary | ICD-10-CM

## 2016-12-07 DIAGNOSIS — R918 Other nonspecific abnormal finding of lung field: Secondary | ICD-10-CM | POA: Diagnosis not present

## 2016-12-07 DIAGNOSIS — M1380 Other specified arthritis, unspecified site: Secondary | ICD-10-CM

## 2016-12-07 DIAGNOSIS — Z79891 Long term (current) use of opiate analgesic: Secondary | ICD-10-CM | POA: Diagnosis not present

## 2016-12-07 DIAGNOSIS — M4186 Other forms of scoliosis, lumbar region: Secondary | ICD-10-CM | POA: Insufficient documentation

## 2016-12-07 DIAGNOSIS — M47818 Spondylosis without myelopathy or radiculopathy, sacral and sacrococcygeal region: Secondary | ICD-10-CM

## 2016-12-07 DIAGNOSIS — K7460 Unspecified cirrhosis of liver: Secondary | ICD-10-CM | POA: Insufficient documentation

## 2016-12-07 DIAGNOSIS — G894 Chronic pain syndrome: Secondary | ICD-10-CM

## 2016-12-07 DIAGNOSIS — M549 Dorsalgia, unspecified: Secondary | ICD-10-CM | POA: Diagnosis present

## 2016-12-07 DIAGNOSIS — F1190 Opioid use, unspecified, uncomplicated: Secondary | ICD-10-CM | POA: Insufficient documentation

## 2016-12-07 DIAGNOSIS — M5441 Lumbago with sciatica, right side: Secondary | ICD-10-CM | POA: Diagnosis not present

## 2016-12-07 DIAGNOSIS — Z794 Long term (current) use of insulin: Secondary | ICD-10-CM | POA: Insufficient documentation

## 2016-12-07 DIAGNOSIS — E785 Hyperlipidemia, unspecified: Secondary | ICD-10-CM | POA: Insufficient documentation

## 2016-12-07 DIAGNOSIS — N182 Chronic kidney disease, stage 2 (mild): Secondary | ICD-10-CM | POA: Insufficient documentation

## 2016-12-07 DIAGNOSIS — M79604 Pain in right leg: Secondary | ICD-10-CM | POA: Diagnosis not present

## 2016-12-07 DIAGNOSIS — G8929 Other chronic pain: Secondary | ICD-10-CM | POA: Insufficient documentation

## 2016-12-07 DIAGNOSIS — M1711 Unilateral primary osteoarthritis, right knee: Secondary | ICD-10-CM

## 2016-12-07 DIAGNOSIS — M961 Postlaminectomy syndrome, not elsewhere classified: Secondary | ICD-10-CM

## 2016-12-07 DIAGNOSIS — M4310 Spondylolisthesis, site unspecified: Secondary | ICD-10-CM

## 2016-12-07 DIAGNOSIS — I739 Peripheral vascular disease, unspecified: Secondary | ICD-10-CM | POA: Diagnosis not present

## 2016-12-07 DIAGNOSIS — Z9889 Other specified postprocedural states: Secondary | ICD-10-CM | POA: Diagnosis not present

## 2016-12-07 DIAGNOSIS — K3184 Gastroparesis: Secondary | ICD-10-CM | POA: Diagnosis not present

## 2016-12-07 DIAGNOSIS — M48061 Spinal stenosis, lumbar region without neurogenic claudication: Secondary | ICD-10-CM | POA: Insufficient documentation

## 2016-12-07 DIAGNOSIS — M25561 Pain in right knee: Secondary | ICD-10-CM

## 2016-12-07 DIAGNOSIS — E11319 Type 2 diabetes mellitus with unspecified diabetic retinopathy without macular edema: Secondary | ICD-10-CM | POA: Diagnosis not present

## 2016-12-07 DIAGNOSIS — Z87442 Personal history of urinary calculi: Secondary | ICD-10-CM | POA: Insufficient documentation

## 2016-12-07 DIAGNOSIS — R809 Proteinuria, unspecified: Secondary | ICD-10-CM | POA: Insufficient documentation

## 2016-12-07 DIAGNOSIS — M797 Fibromyalgia: Secondary | ICD-10-CM | POA: Diagnosis not present

## 2016-12-07 DIAGNOSIS — K589 Irritable bowel syndrome without diarrhea: Secondary | ICD-10-CM | POA: Insufficient documentation

## 2016-12-07 DIAGNOSIS — E1143 Type 2 diabetes mellitus with diabetic autonomic (poly)neuropathy: Secondary | ICD-10-CM | POA: Diagnosis not present

## 2016-12-07 DIAGNOSIS — Z981 Arthrodesis status: Secondary | ICD-10-CM | POA: Insufficient documentation

## 2016-12-07 DIAGNOSIS — E1122 Type 2 diabetes mellitus with diabetic chronic kidney disease: Secondary | ICD-10-CM | POA: Diagnosis not present

## 2016-12-07 DIAGNOSIS — E1121 Type 2 diabetes mellitus with diabetic nephropathy: Secondary | ICD-10-CM | POA: Diagnosis not present

## 2016-12-07 DIAGNOSIS — M79605 Pain in left leg: Secondary | ICD-10-CM

## 2016-12-07 DIAGNOSIS — G4733 Obstructive sleep apnea (adult) (pediatric): Secondary | ICD-10-CM | POA: Insufficient documentation

## 2016-12-07 DIAGNOSIS — Z96652 Presence of left artificial knee joint: Secondary | ICD-10-CM | POA: Insufficient documentation

## 2016-12-07 DIAGNOSIS — I129 Hypertensive chronic kidney disease with stage 1 through stage 4 chronic kidney disease, or unspecified chronic kidney disease: Secondary | ICD-10-CM | POA: Diagnosis not present

## 2016-12-07 DIAGNOSIS — E1151 Type 2 diabetes mellitus with diabetic peripheral angiopathy without gangrene: Secondary | ICD-10-CM | POA: Insufficient documentation

## 2016-12-07 DIAGNOSIS — J45909 Unspecified asthma, uncomplicated: Secondary | ICD-10-CM | POA: Insufficient documentation

## 2016-12-07 DIAGNOSIS — Z6841 Body Mass Index (BMI) 40.0 and over, adult: Secondary | ICD-10-CM | POA: Insufficient documentation

## 2016-12-07 DIAGNOSIS — I252 Old myocardial infarction: Secondary | ICD-10-CM | POA: Insufficient documentation

## 2016-12-07 LAB — COMPREHENSIVE METABOLIC PANEL
ALBUMIN: 3.7 g/dL (ref 3.5–5.0)
ALK PHOS: 106 U/L (ref 38–126)
ALT: 20 U/L (ref 14–54)
ANION GAP: 8 (ref 5–15)
AST: 34 U/L (ref 15–41)
BUN: 13 mg/dL (ref 6–20)
CALCIUM: 9.9 mg/dL (ref 8.9–10.3)
CO2: 28 mmol/L (ref 22–32)
Chloride: 103 mmol/L (ref 101–111)
Creatinine, Ser: 0.66 mg/dL (ref 0.44–1.00)
GFR calc Af Amer: >60 mL/min (ref >60)
GFR calc non Af Amer: >60 mL/min (ref >60)
GLUCOSE: 43 mg/dL — AB (ref 65–99)
POTASSIUM: 3.7 mmol/L (ref 3.5–5.1)
SODIUM: 139 mmol/L (ref 135–145)
Total Bilirubin: 0.3 mg/dL (ref 0.3–1.2)
Total Protein: 7.6 g/dL (ref 6.5–8.1)

## 2016-12-07 LAB — C-REACTIVE PROTEIN: CRP: 1.2 mg/dL — ABNORMAL HIGH (ref <1.0)

## 2016-12-07 LAB — MAGNESIUM: Magnesium: 1.8 mg/dL (ref 1.7–2.4)

## 2016-12-07 LAB — VITAMIN B12: VITAMIN B 12: 787 pg/mL (ref 180–914)

## 2016-12-07 LAB — SEDIMENTATION RATE: SED RATE: 56 mm/h — AB (ref 0–30)

## 2016-12-07 NOTE — Telephone Encounter (Signed)
Critical value lab received from Lab.  Dr Dossie Arbour notified.  Attempted to call patient ar home and mobile numbers.  No answer.  Left message for patient to call office as soon as possible.

## 2016-12-07 NOTE — Progress Notes (Signed)
Normal CRP (C-reactive protein) Level(s): Less than <1.0 mg/dL. Elevated level(s): Levels above 1.0 mg/dL. Clinical significance: C-reactive protein (CRP) is produced by the liver. The level of CRP rises when there is inflammation throughout the body. CRP goes up in response to inflammation. High levels suggests the presence of chronic inflammation but do not identify its location or cause. Drops of previously elevated levels suggest that the inflammation or infection is subsiding and/or responding to treatment. Possible causes: High levels have been observed in obese patients, individuals with bacterial infections, chronic inflammation, or flare-ups of inflammatory conditions. Recommendations: - Consider the use of anti-inflammatory diet and medications. - A normal sedimentation rate should be below 30 mm/hr. The sed rate is an acute phase reactant that indirectly measures the degree of inflammation present in the body. It can be acute, developing rapidly after trauma, injury or infection, for example, or can occur over an extended time (chronic) with conditions such as autoimmune diseases or cancer. The ESR is not diagnostic; it is a non-specific, screening test that may be elevated in a number of these different conditions. It provides general information about the presence or absence of an inflammatory condition. - The combined elevation of the ESR & CRP, may be suggestive of an autoimmune disease. Patients with elevated levels of both should consider an evaluation by a Rheumatologist. 

## 2016-12-07 NOTE — Progress Notes (Signed)
-   Low blood sugar is considered to be less than 70 mg/dl.  We have attempted to contact the patient several times without any success. A message was left in the patient's answering service.

## 2016-12-07 NOTE — Progress Notes (Signed)
Patient's Name: Brittney [name]  MRN: [id number]  Referring Provider: Rusty Aus, MD  DOB: April 04, 1951  PCP: Rusty Aus, MD  DOS: 12/07/2016  Note by: Kathlen Brunswick. Dossie Arbour, MD  Service setting: Ambulatory outpatient  Specialty: Interventional Pain Management  Location: [facility] (AMB) Pain Management Facility    Patient type: New Patient   Primary Reason(s) for Visit: Initial Patient Evaluation CC: Back Pain (lower s/p surgical procedure. )  HPI  Ms. Brittney [name] is a 65 y.o. year old, female patient, who comes today for an initial evaluation. She has Abnormal tumor markers; Chronic kidney disease, stage II (mild); Degenerative spondylolisthesis; Diabetes mellitus with peripheral vascular disease ([place]); Diabetes with retinopathy ([place]); Diabetic nephropathy ([place]); DM (diabetes mellitus) type II uncontrolled, periph vascular disorder ([place]); Fibromyalgia; Gastroparesis due to DM ([place]); Generalized osteoarthritis of multiple sites; Hepatic cirrhosis ([place]); Hyperlipemia; Lung nodule, multiple; Microalbuminuria; Morbid obesity due to excess calories ([place]); OSA (obstructive sleep apnea); Seronegative arthritis; Severe uncontrolled hypertension; Chronic multilevel lumbar spinal stenosis; Type 2 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, with long-term current use of insulin ([place]); Unilateral small kidney; Long term current use of opiate analgesic; Long term prescription opiate use; Opiate use; Chronic pain syndrome; Morbid obesity with BMI of 40.0-44.9, adult ([place]); Type 2 diabetes mellitus with diabetic nephropathy, with long-term current use of insulin ([place]); Chronic low back pain (Location of Primary Source of Pain) (Bilateral) (R>L); 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 Her primarily concern today is the Back Pain (lower s/p surgical procedure. )  Pain Assessment: Self-Reported Pain Score: 4 /10             Reported level is compatible with observation.       Pain Type: Chronic pain Pain Location: Back Pain Orientation: Lower, Right, Left (is worse on the right currently) Pain Descriptors / Indicators:  ("lightning bolt" anytime she tries to move. ) Pain Frequency: Constant  Onset and Duration: Gradual and Present longer than 3 months Cause of pain: Unknown Severity: Getting worse, NAS-11 at its worse: 10/10, NAS-11 at its best: 3/10 and NAS-11 now: 4/10 Timing: Not influenced by the time of the day Aggravating Factors: Bending, Kneeling, Motion, Prolonged standing, Squatting, Stooping , Twisting, Walking, Walking uphill and Walking downhill Alleviating Factors: Hot packs, Lying down, Medications, Nerve blocks, Resting, Sitting and Sleeping Associated Problems: Constipation, Fatigue, Inability to concentrate, Spasms and Pain that wakes patient up Quality of Pain: Agonizing, Cruel, Disabling, Distressing, Feeling of constriction, Horrible, Shooting and Stabbing Previous Examinations or Tests: X-rays, Neurological evaluation, Neurosurgical evaluation and Psychiatric evaluation Previous Treatments: Narcotic medications and Radiofrequency  The patient comes into the clinics today for the first time for a chronic pain management evaluation. The patient indicates her primary pain to be that of the lower back with the right side being worst on the left. She indicates having had 2 back surgeries first one was done by Dr. Trenton Gammon around 2010 for her low back pain. It did not help and she had a complication in the postoperative period with a hematoma. One week later she underwent her second back surgery by one of Dr. Irven [place] partners remove  the hematoma. The patient indicates that neither surgery  was successful in relieving her pain. Around 2012 she then was evaluated by Dr. Vashti Hey and she received some nerve blocks. She indicates that she had over 20 nerve blocks done and they did provide some temporary relief of the pain. The patient's second worst pain is that of the lower extremities with the right being worst on the left. In the case of both lower extremities the pain goes just below the knee running through the back of the leg with some degree of numbness and weakness. She indicates that the pain in the right lower extremities constant while the one in the left comes and goes. The next worst pain is that of her right knee. She had an arthroscopic surgery done by Dr. Franchot Mimes but denies any types of nerve blocks or joint injections. She indicates having had some nerve blocks done by Dr. Andree Elk and Dr. Ronelle Nigh she also indicates being treated at [facility] by her rheumatologist Dr. Almeta Monas.  Today I took the time to provide the patient with information regarding my pain practice. The patient was informed that my practice is divided into two sections: an interventional pain management section, as well as a completely separate and distinct medication management section. The interventional portion of my practice takes place on Tuesdays and Thursdays, while the medication management is conducted on Mondays and Wednesdays. Because of the amount of documentation required on both them, they are kept separated. This means that there is the possibility that the patient may be scheduled for a procedure on Tuesday, while also having a medication management appointment on Wednesday. I have also informed the patient that because of current staffing and facility limitations, I no longer take patients for medication management only. To illustrate the reasons for this, I gave the patient the example of a surgeon and how inappropriate it would be to refer  a patient to his/her practice so that they write for the post-procedure antibiotics on a surgery done by someone else.   The patient was informed that joining my practice means that they are open to any and all interventional therapies. I clarified for the patient that this does not mean that they will be forced to have any procedures done. What it means is that patients looking for a practitioner to simply write for their pain medications and not take advantage of other interventional techniques will be better served by a different practitioner, other than myself. I made it clear that I prefer to spend my time providing those services that I specialize in.  The patient was also made aware of my Comprehensive Pain Management Safety Guidelines where by joining my practice, they limit all of their nerve blocks and joint injections to those done by our practice, for as long as we are retained to manage their care.   Historic Controlled Substance Pharmacotherapy Review  PMP and historical list of controlled substances: Tramadol; alprazolam; oxycodone/APAP; oxycodone; hydrocodone/APAP; lorazepam Highest analgesic regimen found: Oxycodone 5 mg 2 tablets by mouth 5 times a day (50 mg/day of oxycodone) Most recent analgesic: Tramadol 50 mg 1 tablet by mouth 4 hours (300 mg/day of tramadol) Highest recorded MME/day: 85 mg/day MME/day: 30 mg/day Medications: The patient did not bring the medication(s) to the appointment, as requested in our "New Patient Package" Pharmacodynamics: Desired effects: Analgesia: The patient reports >50% benefit. Reported improvement in function: The patient reports medication allows her to accomplish basic ADLs. Clinically meaningful improvement in function (CMIF): Sustained CMIF goals met Perceived effectiveness: Described as  relatively effective, allowing for increase in activities of daily living (ADL) Undesirable effects: Side-effects or Adverse reactions: None  reported Historical Monitoring: The patient  reports that she does not use drugs.. No results found for: MDMA, COCAINSCRNUR, PCPSCRNUR, THCU, ETH Historical Background Evaluation: [place] PDMP: Six (6) year initial data search conducted.             [place] Department of public safety, offender search: Editor, commissioning Information) Non-contributory Risk Assessment Profile: Aberrant behavior: None observed or detected today Risk factors for fatal opioid overdose: None identified today Fatal overdose hazard ratio (HR): Calculation deferred Non-fatal overdose hazard ratio (HR): Calculation deferred Risk of opioid abuse or dependence: 0.7-3.0% with doses ? 36 MME/day and 6.1-26% with doses ? 120 MME/day. Substance use disorder (SUD) risk level: Pending results of Medical Psychology Evaluation for SUD Opioid risk tool (ORT) (Total Score): 3  ORT Scoring interpretation table:  Score <3 = Low Risk for SUD  Score between 4-7 = Moderate Risk for SUD  Score >8 = High Risk for Opioid Abuse   PHQ-2 Depression Scale:  Total score: 0  PHQ-2 Scoring interpretation table: (Score and probability of major depressive disorder)  Score 0 = No depression  Score 1 = 15.4% Probability  Score 2 = 21.1% Probability  Score 3 = 38.4% Probability  Score 4 = 45.5% Probability  Score 5 = 56.4% Probability  Score 6 = 78.6% Probability   PHQ-9 Depression Scale:  Total score: 0  PHQ-9 Scoring interpretation table:  Score 0-4 = No depression  Score 5-9 = Mild depression  Score 10-14 = Moderate depression  Score 15-19 = Moderately severe depression  Score 20-27 = Severe depression (2.4 times higher risk of SUD and 2.89 times higher risk of overuse)   Pharmacologic Plan: Pending ordered tests and/or consults  Meds  The patient has a current medication list which includes the following prescription(s): albuterol, alprazolam, atenolol, butalbital-acetaminophen-caffeine, clonidine, cyclobenzaprine, diclofenac sodium, doxazosin,  escitalopram, fluticasone, gabapentin, hydralazine, hydrochlorothiazide, ibuprofen, insulin detemir, insulin pen needle, insulin regular human concentrated, levofloxacin, lidocaine, linaclotide, lisinopril, metoclopramide, mometasone, ondansetron, pantoprazole, potassium chloride, pramipexole, torsemide, tramadol, trazodone, zolpidem, and metformin.  Current Outpatient Prescriptions on File Prior to Visit  Medication Sig  . albuterol (VENTOLIN HFA) 108 (90 Base) MCG/ACT inhaler TAKE 2 PUFFS BY MOUTH 3 TIMES A DAY AS NEEDED  . ALPRAZolam (XANAX) 0.5 MG tablet Take 0.5 mg by mouth 5 (five) times daily.  Marland Kitchen atenolol (TENORMIN) 100 MG tablet Take 100 mg by mouth daily.   . cloNIDine (CATAPRES - DOSED IN MG/24 HR) 0.2 mg/24hr patch 0.3 patches. Frequency:1XW   Dosage:0.0     Instructions:  Note:Dose: 0.2MG/24H  . cyclobenzaprine (FLEXERIL) 10 MG tablet Take 1 tablet (10 mg total) by mouth 3 (three) times daily as needed for muscle spasms.  . diclofenac sodium (VOLTAREN) 1 % GEL Apply topically.  . doxazosin (CARDURA) 4 MG tablet Take 4 mg by mouth daily.   Marland Kitchen escitalopram (LEXAPRO) 10 MG tablet Take 10 mg by mouth daily.   Marland Kitchen gabapentin (NEURONTIN) 600 MG tablet Take 600 mg by mouth 5 (five) times daily.   . hydrALAZINE (APRESOLINE) 25 MG tablet Take 50 mg by mouth 2 (two) times daily.   . hydrochlorothiazide (HYDRODIURIL) 25 MG tablet Take 25 mg by mouth as needed.   Marland Kitchen ibuprofen (ADVIL,MOTRIN) 800 MG tablet Take 800 mg by mouth 3 (three) times daily.   . insulin regular human CONCENTRATED (HUMULIN R) 500 UNIT/ML kwikpen Inject 65 Units into the skin  3 (three) times daily with meals.   . metoCLOPramide (REGLAN) 10 MG tablet Take 10 mg by mouth 3 (three) times daily.  . mometasone (ELOCON) 0.1 % lotion APPLY TWICE A DAY  . ondansetron (ZOFRAN) 4 MG tablet Take 4 mg by mouth every 8 (eight) hours as needed for nausea.   . pantoprazole (PROTONIX) 40 MG tablet TAKE 1 TABLET (40 MG TOTAL) BY MOUTH 2 (TWO)  TIMES DAILY. TAKE ONE HOUR BEFORE A MEAL  . potassium chloride (K-DUR,KLOR-CON) 10 MEQ tablet TAKE 1 TABLET BY MOUTH ONCE A DAY  . torsemide (DEMADEX) 20 MG tablet Take 20 mg by mouth as needed.   . traMADol (ULTRAM) 50 MG tablet Take 50 mg by mouth 4 (four) times daily.   . traZODone (DESYREL) 150 MG tablet Take 150 mg by mouth at bedtime.   Marland Kitchen zolpidem (AMBIEN) 10 MG tablet Take 10 mg by mouth at bedtime.   . metFORMIN (GLUCOPHAGE-XR) 500 MG 24 hr tablet Take 500 mg by mouth.   No current facility-administered medications on file prior to visit.    Imaging Review  Cervical Imaging: Cervical DG 1 view:  Results for orders placed in visit on 03/26/03  DG Cervical Spine 1 View   Narrative FINDINGS CLINICAL DATA:  C5-6 ACDF. C-ARM C-ARM FLUOROSCOPY WAS PROVIDED. IMPRESSION C-ARM FLUOROSCOPY WAS PROVIDED. PORTABLE LATERAL CERVICAL SPINE A SINGLE SOMEWHAT OBLIQUE VIEW SHOWS PERFORMANCE OF ANTERIOR CERVICAL DISKECTOMY AND FUSION AT C5- 6.  THE ANTERIOR PLATE APPEARS GROSSLY WELL POSITIONED.  DETAIL DOES NOT REALLY PERMIT EVALUATION OF THE INTERBODY ALLOGRAFT. IMPRESSION ACDF C5-6.   Cervical DG 2-3 views:  Results for orders placed in visit on 05/21/03  DG Cervical Spine 2-3 Views   Narrative FINDINGS CLINICAL DATA:  CERVICAL SPINE FUSION, MARCH OF 2004, NOW WITH PAIN. THREE VIEWS OF CERVICAL SPINE THREE VIEWS OF THE CERVICAL SPINE WERE OBTAINED, INCLUDING NEUTRAL LATERAL VIEW AND VIEWS IN FLEXION AND EXTENSION.  IN THE NEUTRAL LATERAL VIEW, ANTERIOR FUSION IS NOTED AT C5-6.  THE ANTERIOR METALLIC FUSION PLATE AND INTERBODY PLUG ARE IN GOOD POSITION, AND NORMAL HEIGHT IS MAINTAINED.  NORMAL ALIGNMENT IS PRESENT.  THROUGH FLEXION AND EXTENSION, THERE IS SLIGHTLY LIMITED MOTION, BUT NO MALALIGNMENT IS SEEN. NO SIGNIFICANT PREVERTEBRAL SOFT TISSUE SWELLING IS NOTED. IMPRESSION ANTERIOR FUSION AT C5-6.  SLIGHTLY LIMITED RANGE OF MOTION THROUGH FLEXION AND EXTENSION BUT NO MALALIGNMENT.    Lumbosacral Imaging: Lumbar MR wo contrast:  Results for orders placed in visit on 04/24/09  MR L Spine Ltd W/O Cm   Narrative * PRIOR REPORT IMPORTED FROM AN EXTERNAL SYSTEM *   PRIOR REPORT IMPORTED FROM THE [facility] WORKFLOW SYSTEM   REASON FOR EXAM:    radiculopathy and scoliosis  COMMENTS:   PROCEDURE:     MR  - MR LUMBAR SPINE WO CONTRAST  - Apr 24 2009  5:20PM   RESULT:        There are no prior exams available for comparison.   The conus medullaris terminates at an L1 level.  The cauda equina  demonstrates no evidence of clumping or thickening.   At the T12-L1 level, L1-2 level and L2-3 level, there is no evidence of  thecal sac stenosis or neural foraminal narrowing.   At the L3-4 level, there is no evidence of thecal sac stenosis or neural  foraminal narrowing.   At the L4-5 level, a broad-based disc bulge is appreciated causing partial  effacement of the anterior CSF space. This contributes to multifactorial  moderate  thecal sac stenosis and bilateral mild neural foraminal narrowing  right greater than left. The thecal sac stenosis is also secondary to  ligamentum flavum and facet hypertrophy.   At the L5-S1 level, there is no evidence of thecal sac stenosis or neural  foraminal narrowing.   IMPRESSION:   1.     Moderate thecal sac stenosis at the L4-5 level with mild bilateral  neural foraminal narrowing.  Mild exiting nerve root compromise  bilaterally  is a diagnostic consideration.  2.     No further evidence of focal or acute pathology.   Thank you for the opportunity to contribute to the care of your patient.       Lumbar MR w/wo contrast:  Results for orders placed in visit on 11/27/09  MR Lumbar Spine W Wo Contrast   Narrative * PRIOR REPORT IMPORTED FROM AN EXTERNAL SYSTEM *   PRIOR REPORT IMPORTED FROM THE [facility] WORKFLOW SYSTEM   REASON FOR EXAM:    radiculopathy  COMMENTS:   PROCEDURE:     MR  - MR LUMBAR SPINE WO/W  - Nov 27 2009  8:39AM    RESULT:     History: Pain   Comparison Study: MRI lumbar spine May 26, 2009   Findings: Multiplanar, multisequence of the lumbar spine is obtained to is  multilevel degeneration. L4-L5 fusion is present. There is no evidence of  prominent epidural enhancement old gadolinium contrast views. 20 cc of  gadolinium was administered the patient for the contrast portion of the  study. Minimal minimal disc intradiscal enhancement noted at L4-L5. This  may  be postsurgical. Careful followup suggested to exclude developing  discitis.  Nonenhancing fluid collection along the posterior back Pearson 5 no prior  study.   IMPRESSION:      Minimal enhancement of the L4-L5 disc. Lumbar vertebra  normal the lowest full size lumbar shaped full size segmented lumbar  shaped  areas L5. The a mild disc enhancement at L4-L5 is most likely related to  mild scarring and postsurgical change. Clinical followup and possible  followup MRI suggested to exclude the possibility of discitis.  2. No evidence of disc protrusion or high-grade neuroforaminal narrowing.  3. Patient's had prior L4-L5 fusion. Good anatomic alignment noted. No  acute  bony abnormality identified. Lumbar cord is normal.       Lumbar DG 2-3 views:  Results for orders placed during the hospital encounter of 05/23/09  DG Lumbar Spine 2-3 Views   Narrative Clinical Data: L4-5 laminectomy and fusion   LUMBAR SPINE - 2-3 VIEW   Comparison: None   Findings: Two C-arm images show posterior decompression with fusion at L4-5.  There are pedicle screws, posterior rods and interbody fusion material.  No radiographically detectable complication.   IMPRESSION: PLIF L4-5  Provider: Mila Palmer, Morrell Riddle   Knee Imaging: Knee-L MR w contrast:  Results for orders placed in visit on 06/08/06  MR Knee Left  Wo Contrast   Narrative * PRIOR REPORT IMPORTED FROM AN EXTERNAL SYSTEM *   PRIOR REPORT IMPORTED FROM THE [facility] WORKFLOW SYSTEM    REASON FOR EXAM:   Bilateral knee pain  COMMENTS:   PROCEDURE:     MR  - MR KNEE LT  WO CONTRAST  - Jun 08 2006  9:37AM   RESULT:        The patient is complaining of knee discomfort.   Multiplanar images were obtained through the knee.  There is a small  effusion.  There is a moderate size popliteal cyst measuring approximately  1.9 cm in diameter.  The fluid sensitive sequences reveal no evidence of  bone marrow edema.  There is thinning of the articular cartilage of the  patella and the patella is displaced slightly laterally in the patellar  notch of the femur.  The medial and lateral collateral ligament complexes  are intact.  The ACL and PCL are intact.  The medial and lateral menisci  exhibit no evidence of a tear.  The quadriceps and patellar tendons are  intact.  There are small osteophytes noted off the superior and inferior  margins of the patella and there are small osteophytes with the femoral  condyles.   IMPRESSION:   1.     There is no evidence of a meniscal tear or cruciate ligament injury  or collateral ligament injury.  2.     There is a moderate size popliteal cyst and small joint effusion.  3.     There is no evidence of bone marrow edema.  4.     Mild degenerative osteophyte formation is seen in all three joint  compartments.  5.     There is mild thinning of the articular cartilage of the patella  and  slight lateral subluxation of the patella with respect to the underlying  femur.   Thank you for this opportunity to contribute to the care of your patient.       Knee-R MR wo contrast:  Results for orders placed in visit on 06/08/06  MR Knee Right Wo Contrast   Narrative * PRIOR REPORT IMPORTED FROM AN EXTERNAL SYSTEM *   PRIOR REPORT IMPORTED FROM THE [facility] WORKFLOW SYSTEM   REASON FOR EXAM:  bilateral knee pain  COMMENTS:   PROCEDURE:     MR  - MR KNEE RT  WO  CONTRAST  - Jun 08 2006  9:37AM   RESULT:          Multiplanar images were  obtained through the knee.  The  patient has a prior diagnosis of chondromalacia patella.   The quadriceps and patellar tendons are intact.  There is thinning of the  articular cartilage of the patella with a small amount of subchondral  increased signal seen best on image #5 of the axial sequences.  The  patella  appears to be somewhat laterally subluxed, and there is associated  degenerative change of the lateral aspect of the patellar facet.  Small  osteophytes are noted on other marginal portions of the femoral condyles.   I  do not see evidence of acute bone marrow edema.  There is a moderate-sized  effusion.  No significant popliteal cyst is evident.  The medial and  lateral  collateral ligament complexes are intact.   There is increased signal in the posterior horn of the lateral meniscus  consistent with degenerative change and likely a tiny tear reaching the  inferior joint surface.  The ACL and PCL are intact.  There is some fluid  associated with the ACL, but I do not see objective evidence of a tear.   The  medial meniscus demonstrates very minimal increased intrasubstance signal  both anteriorly and posteriorly, but no frank tear is seen.   IMPRESSION:   1.     There are degenerative changes of the patellofemoral joint  consistent  with the known chondromalacia patella.  I do not see more than minimal  subchondral increased marrow signal.  Slight lateral subluxation  of the  patella with respect to the lateral femoral condyle is present.  2.     The cruciate and collateral ligaments are intact.  3.     There are mild degenerative-type signal changes in both menisci,  but  there is evidence of a tiny tear in the posterior horn of the lateral  meniscus.  4.     There is a small effusion.   Thank you for this opportunity to contribute to the care of your patient.       Knee-L DG 1-2 views:  Results for orders placed in visit on 09/24/13  DG Knee 1-2 Views Left    Narrative * PRIOR REPORT IMPORTED FROM AN EXTERNAL SYSTEM *   PRIOR REPORT IMPORTED FROM THE [facility] WORKFLOW SYSTEM   REASON FOR EXAM:    postop  COMMENTS:   Bedside (portable):Y   PROCEDURE:     DXR - DXR KNEE LEFT AP AND LATERAL  - Sep 24 2013 11:28AM   RESULT:     AP and lateral portable views performed in the recovery room  reveal the patient has undergone left total knee joint prosthesis  placement.  Radiographic positioning of the prosthetic components is good. There are  surgical drain lines and skin staples present.   IMPRESSION:      The patient has undergone left knee joint prosthesis  placement. Further interpretation is deferred to Dr. Marry Guan.   Dictation Site: 2       Note: Available results from prior imaging studies were reviewed.        ROS  Cardiovascular History: Heart trouble, Abnormal heart rhythm, Hypertension, Heart attack ( Date: 09-2014), Heart murmur and Needs antibiotics prior to dental procedures Pulmonary or Respiratory History: Asthma, Shortness of breath, Snoring , Bronchitis and Sleep apnea Neurological History: Scoliosis Review of Past Neurological Studies:  Results for orders placed or performed in visit on 03/14/11  MR Brain W Wo Contrast   Narrative   * PRIOR REPORT IMPORTED FROM AN EXTERNAL SYSTEM *   PRIOR REPORT IMPORTED FROM THE [facility] WORKFLOW SYSTEM   REASON FOR EXAM:    RT eyelid ptosis  hx of Bells Palsy  COMMENTS:   PROCEDURE:     MR  - MR BRAIN WO/W CONTRAST  - Mar 14 2011 12:13PM   RESULT:   HISTORY: Visual loss.   COMPARISON STUDIES:  No prior.   PROCEDURE AND FINDINGS:  Multiplanar, multisequence imaging of the brain  is  obtained. 20 ml of Multi-Hance was administered to the patient. No mass  lesion or enhancing lesion identified. Diffusion weighted images reveal no  evidence of acute ischemia. The posterior fossa including the eighth nerve  complexes are normal.   IMPRESSION:      No acute or focal abnormality.    Thank you for this opportunity to contribute to the care of your patient.       Psychological-Psychiatric History: Negative for anxiety, depression, schizophrenia, bipolar disorders or suicidal ideations or attempts Gastrointestinal History: Irritable Bowel Syndrome (IBS), Cirrhosis and Constipation Genitourinary History: Negative for nephrolithiasis, hematuria, renal failure or chronic kidney disease Hematological History: Anemia and Brusing easily Endocrine History: Non-insulin-dependent diabetes mellitus Rheumatologic History: Osteoarthritis and Fibromyalgia Musculoskeletal History: Negative for myasthenia gravis, muscular dystrophy, multiple sclerosis or malignant hyperthermia Work History: Disabled  Allergies  Ms. Kellison is allergic to amlodipine; imdur [isosorbide dinitrate]; isosorbide mononitrate [isosorbide dinitrate er]; lyrica [pregabalin]; monosodium glutamate; norvasc [amlodipine besylate]; shellfish allergy; tape; and eggs or egg-derived products.  Laboratory Chemistry  Inflammation Markers Lab Results  Component Value Date   ESRSEDRATE 37 (H) 09/11/2013   CRP 1.1 (H) 07/03/2009   Renal Function Lab Results  Component Value Date   BUN 17 10/23/2014   CREATININE 0.70 06/03/2016   GFRAA >60 10/23/2014   GFRNONAA >60 10/23/2014   Hepatic Function Lab Results  Component Value Date   AST 53 (H) 10/22/2014   ALT 43 10/22/2014   ALBUMIN 3.1 (L) 10/22/2014   Electrolytes Lab Results  Component Value Date   NA 139 10/23/2014   K 4.1 10/23/2014   CL 101 10/23/2014   CALCIUM 8.5 10/23/2014   MG 1.8 10/23/2014   Pain Modulating Vitamins No results found for: Maralyn Sago [id number], [id number], [id number], 25OHVITD1, 25OHVITD2, 25OHVITD3, VITAMINB12 Coagulation Parameters Lab Results  Component Value Date   INR 0.95 09/20/2016   LABPROT 12.7 09/20/2016   APTT 34.9 10/22/2014   PLT 249 09/20/2016   Cardiovascular Lab Results  Component Value Date   HGB 12.0  09/20/2016   HCT 37.6 09/20/2016   Note: Lab results reviewed.  [place]  Drug: Ms. Recchia  reports that she does not use drugs. Alcohol:  reports that she does not drink alcohol. Tobacco:  reports that she has never smoked. She has never used smokeless tobacco. Medical:  has a past medical history of Abdominal pain, left upper quadrant (12/11/2012); Arthritis; Asthma; Cancer ([place]); Collagen vascular disease ([place]); Diabetes mellitus without complication ([place]); Diabetic nephropathy associated with secondary diabetes mellitus ([place]); Flushing (12/11/2012); Gastroparesis; Gross hematuria (12/11/2012); Hepatic cirrhosis ([place]); Hyperlipidemia; Hypertension; Kidney stone (12/11/2012); Morbid obesity with BMI of 40.0-44.9, adult ([place]); Nausea without vomiting (12/11/2012); Nephrolithiasis; Nephrolithiasis (04/26/2014); Nonproliferative retinopathy due to secondary diabetes ([place]); Peripheral vascular disease ([place]); Renal colic (85/01/7740); Sciatica; Sciatica (12/11/2012); Sleep apnea; and Unilateral small kidney without contralateral hypertrophy. Family: family history is not on file.  Past Surgical History:  Procedure Laterality Date  . ABDOMINAL HYSTERECTOMY    . BACK SURGERY  04/1999   L-4-5 LAMINECTOMY  . CESAREAN SECTION  1980  . COLONOSCOPY  05/04/2001  . COLONOSCOPY WITH PROPOFOL N/A 09/20/2016   Procedure: COLONOSCOPY WITH PROPOFOL;  Surgeon: Lollie Sails, MD;  Location: [facility] ENDOSCOPY;  Service: Endoscopy;  Laterality: N/A;  . DILATION AND CURETTAGE OF UTERUS    . ESOPHAGOGASTRODUODENOSCOPY  02/08/2014  . ESOPHAGOGASTRODUODENOSCOPY (EGD) WITH PROPOFOL N/A 09/20/2016   Procedure: ESOPHAGOGASTRODUODENOSCOPY (EGD) WITH PROPOFOL;  Surgeon: Lollie Sails, MD;  Location: [facility] ENDOSCOPY;  Service: Endoscopy;  Laterality: N/A;  . EYE SURGERY  1986   FOR MELANOMA  . JOINT REPLACEMENT Left 09/24/2013   TOTAL KNEE  . TONSILLECTOMY     WITH ADENOIDECTOMY   Active Ambulatory Problems     Diagnosis Date Noted  . Abnormal tumor markers 12/11/2012  . Chronic kidney disease, stage II (mild) 12/11/2012  . Degenerative spondylolisthesis 09/08/2016  . Diabetes mellitus with peripheral vascular disease ([place]) 03/16/2016  . Diabetes with retinopathy ([place]) 12/07/2016  . Diabetic nephropathy ([place]) 12/07/2016  . DM (diabetes mellitus) type II uncontrolled, periph vascular disorder ([place]) 04/26/2014  . Fibromyalgia 04/15/2016  . Gastroparesis due to DM ([place]) 12/07/2016  . Generalized osteoarthritis of multiple sites 10/22/2016  . Hepatic cirrhosis ([place]) 06/20/2016  . Hyperlipemia 04/26/2014  . Lung nodule, multiple 08/31/2016  . Microalbuminuria 02/20/2016  . Morbid obesity due to excess calories ([place]) 03/16/2016  . OSA (obstructive sleep apnea) 04/26/2014  . Seronegative arthritis 04/15/2016  . Severe uncontrolled hypertension 01/27/2016  . Chronic multilevel lumbar spinal stenosis  09/08/2016  . Type 2 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, with long-term current use of insulin ([place]) 03/16/2016  . Unilateral small kidney 12/11/2012  . Long term current use of opiate analgesic 12/07/2016  . Long term prescription opiate use 12/07/2016  . Opiate use 12/07/2016  . Chronic pain syndrome 12/07/2016  . Morbid obesity with BMI of 40.0-44.9, adult ([place]) 03/16/2016  . Type 2 diabetes mellitus with diabetic nephropathy, with long-term current use of insulin ([place]) 03/16/2016  . Chronic low back pain (Location of Primary Source of Pain) (Bilateral) (R>L) 12/07/2016  . 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 Chronic lower extremity pain (Location of Secondary source of pain) (Bilateral) (R>L) 12/07/2016  . Chronic knee pain (Location of Tertiary source of pain) (Right) 12/07/2016  . Osteoarthritis of knee (Right) 12/07/2016  . Grade 1 Anterolisthesis of L3 over L4 and L4 over L5 12/07/2016  . Osteoarthritis of  sacroiliac joint (Right) 12/07/2016   Resolved Ambulatory Problems    Diagnosis Date Noted  . Abdominal pain, left upper quadrant 12/11/2012  . Kidney stone 12/11/2012  . Flushing 12/11/2012  . Gross hematuria 12/11/2012  . Nausea without vomiting 12/11/2012  . Nephrolithiasis 04/26/2014  . Renal colic 66/44/0347  . Sciatica 12/11/2012   Past Medical History:  Diagnosis Date  . Abdominal pain, left upper quadrant 12/11/2012  . Arthritis   . Asthma   . Cancer ([place])   . Collagen vascular disease ([place])   . Diabetes mellitus without complication ([place])   . Diabetic nephropathy associated with secondary diabetes mellitus ([place])   . Flushing 12/11/2012  . Gastroparesis   . Gross hematuria 12/11/2012  . Hepatic cirrhosis ([place])   . Hyperlipidemia   . Hypertension   . Kidney stone 12/11/2012  . Morbid obesity with BMI of 40.0-44.9, adult ([place])   . Nausea without vomiting 12/11/2012  . Nephrolithiasis   . Nephrolithiasis 04/26/2014  . Nonproliferative retinopathy due to secondary diabetes ([place])   . Peripheral vascular disease ([place])   . Renal colic 42/59/5638  . Sciatica   . Sciatica 12/11/2012  . Sleep apnea   . Unilateral small kidney without contralateral hypertrophy    Constitutional Exam  General appearance: Well nourished, well developed, and well hydrated. In no apparent acute distress Vitals:   12/07/16 1024  BP: (!) 161/65  Pulse: 73  Resp: 16  Temp: 98.2 F (36.8 C)  TempSrc: Oral  SpO2: 94%  Weight: 274 lb (124.3 kg)  Height: _0  (1.676 m)   BMI Assessment: Estimated body mass index is 44.22 kg/m as calculated from the following:   Height as of this encounter: _1  (1.676 m).   Weight as of this encounter: 274 lb (124.3 kg).  BMI interpretation table: BMI level Category Range association with higher incidence of chronic pain  <18 kg/m2 Underweight   18.5-24.9 kg/m2 Ideal body weight   25-29.9 kg/m2 Overweight Increased incidence by 20%  30-34.9 kg/m2  Obese (Class I) Increased incidence by 68%  35-39.9 kg/m2 Severe obesity (Class II) Increased incidence by 136%  >40 kg/m2 Extreme obesity (Class III) Increased incidence by 254%   BMI Readings from Last 4 Encounters:  12/07/16 44.22 kg/m  11/21/16 41.50 kg/m  09/20/16 43.42 kg/m  08/02/16 43.58 kg/m   Wt Readings from Last 4 Encounters:  12/07/16 274 lb (124.3 kg)  11/21/16 265 lb (120.2 kg)  09/20/16 269 lb (122 kg)  08/02/16 270 lb (122.5 kg)  Psych/Mental status: Alert, oriented x 3 (person, place, & time) Eyes: PERLA Respiratory: No evidence of acute respiratory distress  Cervical Spine Exam  Inspection: No masses, redness, or swelling Alignment: Symmetrical Functional ROM: Unrestricted ROM Stability: No instability detected Muscle strength & Tone: Functionally intact Sensory: Unimpaired Palpation: Non-contributory  Upper Extremity (UE) Exam    Side: Right upper extremity  Side: Left upper extremity  Inspection: No masses, redness, swelling, or asymmetry  Inspection: No masses, redness, swelling, or asymmetry  Functional ROM: Unrestricted ROM          Functional ROM: Unrestricted ROM          Muscle strength & Tone: Functionally intact  Muscle strength & Tone: Functionally intact  Sensory: Unimpaired  Sensory: Unimpaired  Palpation: Non-contributory  Palpation: Non-contributory   Thoracic Spine Exam  Inspection: No masses, redness, or swelling Alignment: Symmetrical Functional ROM: Unrestricted ROM Stability: No instability detected Sensory: Unimpaired Muscle strength & Tone: Functionally intact Palpation: Non-contributory  Lumbar Spine Exam  Inspection: No masses, redness, or swelling Alignment: Symmetrical Functional ROM: Unrestricted ROM Stability: No instability detected Muscle strength & Tone: Functionally intact Sensory: Unimpaired Palpation: Non-contributory Provocative Tests: Lumbar Hyperextension and rotation test: evaluation deferred today        Patrick's Maneuver: evaluation deferred today              Gait & Posture Assessment  Ambulation: Unassisted Gait: Relatively normal for age and body habitus Posture: WNL   Lower Extremity Exam    Side: Right lower extremity  Side: Left lower extremity  Inspection: No masses, redness, swelling, or asymmetry  Inspection: No masses, redness, swelling, or asymmetry  Functional ROM: Unrestricted ROM          Functional ROM: Unrestricted ROM          Muscle strength & Tone: Functionally intact  Muscle strength & Tone: Functionally intact  Sensory: Unimpaired  Sensory: Unimpaired  Palpation: Non-contributory  Palpation: Non-contributory   Assessment  Primary Diagnosis & Pertinent Problem List: The primary encounter diagnosis was Chronic pain syndrome. Diagnoses of Long term current use of opiate analgesic, Long term prescription opiate use, Opiate use, Chronic bilateral low back pain with bilateral sciatica, Failed back surgical syndrome, Chronic lower extremity pain (Location of Secondary source of pain) (Bilateral) (R>L), Chronic knee pain (Location of Tertiary source of pain) (Right), Osteoarthritis of knee (Right), Grade 1 Anterolisthesis of L3 over L4 and L4 over L5, Osteoarthritis of sacroiliac joint (Right), and Seronegative arthritis were also pertinent to this visit.  Visit Diagnosis: 1. Chronic pain syndrome   2. Long term current use of opiate analgesic   3. Long term prescription opiate use   4. Opiate use   5. Chronic bilateral low back pain with bilateral sciatica   6. Failed back surgical syndrome   7. Chronic lower extremity pain (Location of Secondary source of pain) (Bilateral) (R>L)   8. Chronic knee pain (Location of Tertiary source of pain) (Right)   9. Osteoarthritis of knee (Right)   10. Grade 1 Anterolisthesis of L3 over L4 and L4 over L5   11. Osteoarthritis of sacroiliac joint (Right)   12. Seronegative arthritis    Plan of Care  Initial treatment plan:   Please be advised that as per protocol, today's visit has been an evaluation only. We have not taken over the patient's controlled substance management.  Problem-specific plan: No problem-specific Assessment & Plan notes found for this encounter.  Ordered Lab-work, Procedure(s), Referral(s), & Consult(s): Orders Placed  This Encounter  Procedures  . MR LUMBAR SPINE W WO CONTRAST  . Compliance Drug Analysis, Ur  . Comprehensive metabolic panel  . C-reactive protein  . Magnesium  . Sedimentation rate  . Vitamin B12  . 25-Hydroxyvitamin D Lcms D2+D3  . Ambulatory referral to Psychology   Pharmacotherapy: Medications ordered:  No orders of the defined types were placed in this encounter.  Medications administered during this visit: Ms. Down had no medications administered during this visit.   Pharmacotherapy under consideration:  Opioid Analgesics: The patient was informed that there is no guarantee that she would be a candidate for opioid analgesics. The decision will be made following CDC guidelines. This decision will be based on the results of diagnostic studies, as well as Ms. Gavel risk profile.  Membrane stabilizer: To be determined at a later time Muscle relaxant: To be determined at a later time NSAID: To be determined at a later time Other analgesic(s): To be determined at a later time   Interventional therapies under consideration: Ms. Hinchey was informed that there is no guarantee that she would be a candidate for interventional therapies. The decision will be based on the results of diagnostic studies, as well as Ms. Riepe risk profile.  Possible procedure(s): Diagnostic caudal epidural steroid injection + diagnostic epidurogram  Possible Racz procedure  Diagnostic right-sided translaminar lumbar epidural steroid injections Diagnostic bilateral transforaminal epidural steroid injections Diagnostic bilateral lumbar facet block under fluoroscopic guidance   Possible bilateral lumbar facet RFA  Diagnostic right intra-articular knee joint injection with local anesthetic and steroids  Possible series of 5 right-sided Hyalgan intra-articular knee injections  Diagnostic right-sided genicular nerve block under fluoroscopic guidance  Possible right-sided genicular nerve radiofrequency ablation    Provider-requested follow-up: Return for 2nd Visit, after [facility] evaluation.  No future appointments.  Primary Care Physician: Rusty Aus, MD Location: [facility] Note by: Kathlen Brunswick. Dossie Arbour, M.D, DABA, DABAPM, DABPM, DABIPP, FIPP Date: 12/07/16; Time: 1:04 PM  Pain Score Disclaimer: We use the NRS-11 scale. This is a self-reported, subjective measurement of pain severity with only modest accuracy. It is used primarily to identify changes within a particular patient. It must be understood that outpatient pain scales are significantly less accurate that those used for research, where they can be applied under ideal controlled circumstances with minimal exposure to variables. In reality, the score is likely to be a combination of pain intensity and pain affect, where pain affect describes the degree of emotional arousal or changes in action readiness caused by the sensory experience of pain. Factors such as social and work situation, setting, emotional state, anxiety levels, expectation, and prior pain experience may influence pain perception and show large inter-individual differences that may also be affected by time variables.  Patient instructions provided during this appointment: There are no Patient Instructions on file for this visit.

## 2016-12-07 NOTE — Progress Notes (Signed)
Safety precautions to be maintained throughout the outpatient stay will include: orient to surroundings, keep bed in low position, maintain call bell within reach at all times, provide assistance with transfer out of bed and ambulation.  

## 2016-12-08 NOTE — Telephone Encounter (Signed)
Patient called the office, told her that her blood glucose is very low. Patient states that while she was in the lab, she began to feel weak. She was given peanut butter and crackers and felt better and left after labs were drawn. Patient advised to eat a meal as soon as possible and keep a close check on her blood sugar.

## 2016-12-10 LAB — 25-HYDROXY VITAMIN D LCMS D2+D3
25-Hydroxy, Vitamin D-2: 1.2 ng/mL
25-Hydroxy, Vitamin D-3: 25 ng/mL
25-Hydroxy, Vitamin D: 26 ng/mL — ABNORMAL LOW

## 2016-12-12 LAB — COMPLIANCE DRUG ANALYSIS, UR

## 2016-12-30 ENCOUNTER — Ambulatory Visit: Admission: RE | Admit: 2016-12-30 | Payer: Medicare Other | Source: Ambulatory Visit

## 2016-12-31 ENCOUNTER — Ambulatory Visit
Admission: RE | Admit: 2016-12-31 | Discharge: 2016-12-31 | Disposition: A | Discharge status: Home / Self Care | Payer: Medicare Other | Source: Ambulatory Visit | Attending: Pain Medicine | Admitting: Pain Medicine

## 2016-12-31 DIAGNOSIS — M5126 Other intervertebral disc displacement, lumbar region: Secondary | ICD-10-CM | POA: Insufficient documentation

## 2016-12-31 DIAGNOSIS — M4726 Other spondylosis with radiculopathy, lumbar region: Secondary | ICD-10-CM | POA: Insufficient documentation

## 2016-12-31 DIAGNOSIS — M48061 Spinal stenosis, lumbar region without neurogenic claudication: Secondary | ICD-10-CM | POA: Diagnosis not present

## 2016-12-31 DIAGNOSIS — M5442 Lumbago with sciatica, left side: Secondary | ICD-10-CM

## 2016-12-31 DIAGNOSIS — M961 Postlaminectomy syndrome, not elsewhere classified: Secondary | ICD-10-CM

## 2016-12-31 DIAGNOSIS — M5441 Lumbago with sciatica, right side: Secondary | ICD-10-CM

## 2016-12-31 DIAGNOSIS — M47816 Spondylosis without myelopathy or radiculopathy, lumbar region: Secondary | ICD-10-CM

## 2016-12-31 DIAGNOSIS — M5431 Sciatica, right side: Secondary | ICD-10-CM | POA: Insufficient documentation

## 2016-12-31 DIAGNOSIS — Z981 Arthrodesis status: Secondary | ICD-10-CM | POA: Diagnosis not present

## 2016-12-31 DIAGNOSIS — Z9889 Other specified postprocedural states: Secondary | ICD-10-CM | POA: Diagnosis not present

## 2016-12-31 DIAGNOSIS — G8929 Other chronic pain: Secondary | ICD-10-CM | POA: Insufficient documentation

## 2016-12-31 DIAGNOSIS — M5432 Sciatica, left side: Secondary | ICD-10-CM | POA: Insufficient documentation

## 2016-12-31 DIAGNOSIS — M5416 Radiculopathy, lumbar region: Secondary | ICD-10-CM | POA: Diagnosis present

## 2016-12-31 MED ORDER — GADOBENATE DIMEGLUMINE 529 MG/ML IV SOLN
20.0000 mL | Freq: Once | INTRAVENOUS | Status: AC | PRN
  Administered 2016-12-31: 20 mL via INTRAVENOUS

## 2017-01-03 ENCOUNTER — Telehealth: Payer: Self-pay | Admitting: Pain Medicine

## 2017-01-03 NOTE — Telephone Encounter (Signed)
Patient called to make 2nd appt and states she had MRI done and is in a lot of pain, she would like to know results of MRI as her 2nd visit is not until 01-18-17

## 2017-01-03 NOTE — Telephone Encounter (Signed)
Spoke with Brittney [name] re; MRI results.  Read impression to patient and told her that she will need to wait and talk further with Dr Dossie Arbour at her next visit re; reults. Patient verbalizes u/o information.

## 2017-01-10 DIAGNOSIS — M47816 Spondylosis without myelopathy or radiculopathy, lumbar region: Secondary | ICD-10-CM | POA: Insufficient documentation

## 2017-01-17 DIAGNOSIS — M792 Neuralgia and neuritis, unspecified: Secondary | ICD-10-CM | POA: Insufficient documentation

## 2017-01-17 NOTE — Progress Notes (Signed)
Patient's Name: Brittney [name]  MRN: [id number]  Referring Provider: Rusty Aus, MD  DOB: 1951/05/12  PCP: Rusty Aus, MD  DOS: 01/18/2017  Note by: Kathlen Brunswick. Dossie Arbour, MD  Service setting: Ambulatory outpatient  Specialty: Interventional Pain Management  Location: [facility] (AMB) Pain Management Facility    Patient type: Established   Primary Reason(s) for Visit: Encounter for evaluation before starting new chronic pain management plan of care (Level of risk: moderate) CC: Rectal Pain (both butt cheeks pain going down the back of legs to the knee)  HPI  Brittney [name] is a 66 y.o. year old, female patient, who comes today for a follow-up evaluation to review the test results and decide on a treatment plan. She has Abnormal tumor markers; Chronic kidney disease, stage II (mild); Degenerative spondylolisthesis; Diabetes mellitus with peripheral vascular disease ([place]); Diabetes with retinopathy ([place]); Diabetic nephropathy ([place]); DM (diabetes mellitus) type II uncontrolled, periph vascular disorder ([place]); Fibromyalgia; Gastroparesis due to DM ([place]); Generalized osteoarthritis of multiple sites; Hepatic cirrhosis ([place]); Hyperlipemia; Lung nodule, multiple; Microalbuminuria; OSA (obstructive sleep apnea); Seronegative arthritis; Severe uncontrolled hypertension; L3-4 severe lumbar spinal stenosis (12/31/2016 MRI); Type 2 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, with long-term current use of insulin ([place]); Unilateral small kidney; Long term current use of opiate analgesic; Long term prescription opiate use; Opiate use; Chronic pain syndrome; Morbid obesity with BMI of 40.0-44.9, adult ([place]); Type 2 diabetes mellitus with diabetic nephropathy, with long-term current use of insulin ([place]); Chronic low back pain (Location of Primary Source of Pain) (Bilateral) (R>L); 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 Her primarily concern today is the Rectal Pain (both butt cheeks pain going down the back of legs to the knee)  Pain Assessment: Self-Reported Pain Score: 4  (sometimes pain changes; pain a 4/10 sitting ; standing up pain is 7-9/10)/10 Clinically the patient looks like a 2/10 Reported level is inconsistent with clinical observations. Information on the proper use of the pain score provided to the patient today Pain Type: Chronic pain Pain Location: Buttocks Pain Orientation: Right, Left Pain Descriptors / Indicators: Aching, Constant, Radiating, Sharp, Tightness Pain Frequency: Constant  Brittney [name] comes in today for a follow-up visit after her initial evaluation on 01/03/2017. Today we went over the results of her tests. These were explained in "Layman's terms". During today's appointment we went over my diagnostic impression, as well as the proposed treatment plan.  In considering the treatment plan options, Brittney [name] was reminded that I no longer take patients for medication management only. I asked her to let me know if she had no intention of taking advantage of the interventional therapies, so that we could make arrangements to provide this space to someone interested. I also made it clear that undergoing interventional therapies for the purpose of getting pain medications is very inappropriate on the part of a patient, and it will not be tolerated in this practice. This type of behavior would suggest true addiction and therefore it requires referral to an addiction specialist.   Further details  on both, my assessment(s), as  well as the proposed treatment plan, please see below. Controlled Substance Pharmacotherapy Assessment REMS (Risk Evaluation and Mitigation Strategy)  Analgesic: Tramadol 50 mg 1 tablet by mouth 4 hours (300 mg/day of tramadol) MME/day: 30 mg/day Pill Count: None expected due to no prior prescriptions written by our practice. Pharmacokinetics: Liberation and absorption (onset of action): WNL Distribution (time to peak effect): WNL Metabolism and excretion (duration of action): WNL         Pharmacodynamics: Desired effects: Analgesia: Brittney [name] reports >50% benefit. Functional ability: Patient reports that medication allows her to accomplish basic ADLs Clinically meaningful improvement in function (CMIF): Sustained CMIF goals met Perceived effectiveness: Described as relatively effective, allowing for increase in activities of daily living (ADL) Undesirable effects: Side-effects or Adverse reactions: None reported Monitoring: [place] PMP: Online review of the past 77-monthperiod previously conducted. Not applicable at this point since we have not taken over the patient's medication management yet. List of all UDS test(s) done:  Lab Results  Component Value Date   SUMMARY FINAL 12/07/2016   Last UDS on record: Summary  Date Value Ref Range Status  12/07/2016 FINAL  Final    Comment:    ==================================================================== [facility] COMP DRUG ANALYSIS,UR ==================================================================== Test                             Result       Flag       Units Drug Present and Declared for Prescription Verification   Alprazolam                     350          EXPECTED   ng/mg creat   Alpha-hydroxyalprazolam        1573         EXPECTED   ng/mg creat    Source of alprazolam is a scheduled prescription medication.    Alpha-hydroxyalprazolam is an expected metabolite of alprazolam.   Butalbital                      PRESENT      EXPECTED   Tramadol                       PRESENT      EXPECTED   O-Desmethyltramadol            PRESENT      EXPECTED   N-Desmethyltramadol            PRESENT      EXPECTED    Source of tramadol is a prescription medication.    O-desmethyltramadol and N-desmethyltramadol are expected    metabolites of tramadol.   Gabapentin                     PRESENT      EXPECTED   Cyclobenzaprine                PRESENT      EXPECTED   Citalopram                     PRESENT      EXPECTED   Desmethylcitalopram            PRESENT      EXPECTED    Desmethylcitalopram is an expected metabolite of citalopram or    the enantiomeric form, escitalopram.   Trazodone  PRESENT      EXPECTED   1,3 chlorophenyl piperazine    PRESENT      EXPECTED    1,3-chlorophenyl piperazine is an expected metabolite of    trazodone.   Acetaminophen                  PRESENT      EXPECTED   Ibuprofen                      PRESENT      EXPECTED   Atenolol                       PRESENT      EXPECTED Drug Present not Declared for Prescription Verification   Lorazepam                      602          UNEXPECTED ng/mg creat    Source of lorazepam is a scheduled prescription medication. Drug Absent but Declared for Prescription Verification   Zolpidem                       Not Detected UNEXPECTED    Zolpidem, as indicated in the declared medication list, is not    always detected even when used as directed.   Diclofenac                     Not Detected UNEXPECTED    Diclofenac, as indicated in the declared medication list, is not    always detected even when used as directed.   Lidocaine                      Not Detected UNEXPECTED    Lidocaine, as indicated in the declared medication list, is not    always detected even when used as directed.   Clonidine                      Not Detected UNEXPECTED ==================================================================== Test                       Result    Flag   Units      Ref Range   Creatinine              101              mg/dL      >=20 ==================================================================== Declared Medications:  The flagging and interpretation on this report are based on the  following declared medications.  Unexpected results may arise from  inaccuracies in the declared medications.  **Note: The testing scope of this panel includes these medications:  Alprazolam (Xanax)  Atenolol  Butalbital (Fioricet)  Citalopram (Lexapro)  Clonidine  Cyclobenzaprine (Flexeril)  Gabapentin  Tramadol (Ultram)  Trazodone (Desyrel)  **Note: The testing scope of this panel does not include small to  moderate amounts of these reported medications:  Acetaminophen (Fioricet)  Diclofenac (Voltaren)  Ibuprofen  Lidocaine (Lidoderm)  Zolpidem (Ambien)  **Note: The testing scope of this panel does not include following  reported medications:  Albuterol  Doxazosin (Cardura)  Eye Drops  Fluticasone (Flonase)  Hydralazine (Apresoline)  Hydrochlorothiazide  Insulin (Humulin)  Insulin (Levemir)  Levofloxacin (Levaquin)  Linaclotide (Linzess)  Lisinopril  Metformin  Metoclopramide (Reglan)  Ondansetron (Zofran)  Pantoprazole (Protonix)  Potassium  Pramipexole (Mirapex)  Torsemide (Demadex) ==================================================================== For clinical consultation, please call [phone]. ====================================================================    UDS interpretation: No unexpected findings.          Medication Assessment Form: Patient introduced to form today Treatment compliance: Treatment may start today if patient agrees with proposed plan. Evaluation of compliance is not applicable at this point Risk Assessment Profile: Aberrant behavior: See initial evaluations. None observed or detected today Comorbid factors increasing risk of overdose: See initial evaluation. No  additional risks detected today Risk Mitigation Strategies:  Patient opioid safety counseling: Completed today. Counseling provided to patient as per "Patient Counseling Document". Document signed by patient, attesting to counseling and understanding Patient-Prescriber Agreement (PPA): Obtained today  Controlled substance notification to other providers: Written and sent today  Pharmacologic Plan: Today we may be taking over the patient's pharmacological regimen. See below  Laboratory Chemistry  Inflammation Markers Lab Results  Component Value Date   ESRSEDRATE 56 (H) 12/07/2016   CRP 1.2 (H) 12/07/2016   Renal Function Lab Results  Component Value Date   BUN 13 12/07/2016   CREATININE 0.66 12/07/2016   GFRAA >60 12/07/2016   GFRNONAA >60 12/07/2016   Hepatic Function Lab Results  Component Value Date   AST 34 12/07/2016   ALT 20 12/07/2016   ALBUMIN 3.7 12/07/2016   Electrolytes Lab Results  Component Value Date   NA 139 12/07/2016   K 3.7 12/07/2016   CL 103 12/07/2016   CALCIUM 9.9 12/07/2016   MG 1.8 12/07/2016   Pain Modulating Vitamins Lab Results  Component Value Date   25OHVITD1 26 (L) 12/07/2016   25OHVITD2 1.2 12/07/2016   25OHVITD3 25 12/07/2016   VITAMINB12 787 12/07/2016   Coagulation Parameters Lab Results  Component Value Date   INR 0.95 09/20/2016   LABPROT 12.7 09/20/2016   APTT 34.9 10/22/2014   PLT 249 09/20/2016   Cardiovascular Lab Results  Component Value Date   HGB 12.0 09/20/2016   HCT 37.6 09/20/2016   Note: Lab results reviewed and explained to patient in Layman's terms.  Recent Diagnostic Imaging Review  Mr Lumbar Spine W Wo Contrast Result Date: 12/31/2016 CLINICAL DATA:  Chronic back pain and bilateral sciatica. Failed back surgical syndrome. EXAM: MRI LUMBAR SPINE WITHOUT AND WITH CONTRAST TECHNIQUE: Multiplanar and multiecho pulse sequences of the lumbar spine were obtained without and with intravenous contrast. CONTRAST:   63m MULTIHANCE GADOBENATE DIMEGLUMINE 529 MG/ML IV SOLN COMPARISON:  CT scan of the abdomen and pelvis dated 06/03/2016 and lumbar MRI dated 11/27/2009 FINDINGS: Segmentation:  Standard. Alignment:  Physiologic. Vertebrae: Multiple small stable hemangiomata in the spine. Solid interbody fusion at L4-5 with posterior decompression. Conus medullaris: Extends to the L1-2 level and appears normal. Paraspinal and other soft tissues: Negative except for chronic postsurgical changes posteriorly at L4-5. Disc levels: T11-12 through L1-2:  Negative. L2-3: The disc is normal. Slight hypertrophy of the ligamentum flavum. Minimal narrowing of the spinal canal. L3-4: Progressive now severe bilateral facet arthritis with ligamentum flavum hypertrophy and bilateral joint effusions with severe spinal stenosis. Small broad-based soft disc protrusion contributes to compression of the thecal sac. Moderate bilateral foraminal stenosis. L4-5: Solid interbody fusion. Excellent posterior decompression. Enhancing scar tissue around the thecal sac to the expected degree. No focal neural impingement. Widely patent neural foramina. L5-S1: Normal disc. Slight bilateral facet arthritis, left more than right, slightly progressed. IMPRESSION: 1. Progressive now severe bilateral facet arthritis at L3-4 with new severe spinal stenosis at L3-4. Thecal sac dimensions at  minimum are 11.7 x 7.8 mm on image 22 of series 6. 2. Stable postsurgical changes at L4-5. Electronically Signed   By: Lorriane Shire M.D.   On: 12/31/2016 14:46   Cervical Imaging: Cervical DG 1 view:  Results for orders placed in visit on 03/26/03  DG Cervical Spine 1 View   Narrative FINDINGS CLINICAL DATA:  C5-6 ACDF. C-ARM C-ARM FLUOROSCOPY WAS PROVIDED. IMPRESSION C-ARM FLUOROSCOPY WAS PROVIDED. PORTABLE LATERAL CERVICAL SPINE A SINGLE SOMEWHAT OBLIQUE VIEW SHOWS PERFORMANCE OF ANTERIOR CERVICAL DISKECTOMY AND FUSION AT C5- 6.  THE ANTERIOR PLATE APPEARS GROSSLY  WELL POSITIONED.  DETAIL DOES NOT REALLY PERMIT EVALUATION OF THE INTERBODY ALLOGRAFT. IMPRESSION ACDF C5-6.   Cervical DG 2-3 views:  Results for orders placed in visit on 05/21/03  DG Cervical Spine 2-3 Views   Narrative FINDINGS CLINICAL DATA:  CERVICAL SPINE FUSION, MARCH OF 2004, NOW WITH PAIN. THREE VIEWS OF CERVICAL SPINE THREE VIEWS OF THE CERVICAL SPINE WERE OBTAINED, INCLUDING NEUTRAL LATERAL VIEW AND VIEWS IN FLEXION AND EXTENSION.  IN THE NEUTRAL LATERAL VIEW, ANTERIOR FUSION IS NOTED AT C5-6.  THE ANTERIOR METALLIC FUSION PLATE AND INTERBODY PLUG ARE IN GOOD POSITION, AND NORMAL HEIGHT IS MAINTAINED.  NORMAL ALIGNMENT IS PRESENT.  THROUGH FLEXION AND EXTENSION, THERE IS SLIGHTLY LIMITED MOTION, BUT NO MALALIGNMENT IS SEEN. NO SIGNIFICANT PREVERTEBRAL SOFT TISSUE SWELLING IS NOTED. IMPRESSION ANTERIOR FUSION AT C5-6.  SLIGHTLY LIMITED RANGE OF MOTION THROUGH FLEXION AND EXTENSION BUT NO MALALIGNMENT.   Lumbosacral Imaging: Lumbar MR wo contrast:  Results for orders placed in visit on 04/24/09  MR L Spine Ltd W/O Cm   Narrative * PRIOR REPORT IMPORTED FROM AN EXTERNAL SYSTEM *   PRIOR REPORT IMPORTED FROM THE [facility] WORKFLOW SYSTEM   REASON FOR EXAM:    radiculopathy and scoliosis  COMMENTS:   PROCEDURE:     MR  - MR LUMBAR SPINE WO CONTRAST  - Apr 24 2009  5:20PM   RESULT:        There are no prior exams available for comparison.   The conus medullaris terminates at an L1 level.  The cauda equina  demonstrates no evidence of clumping or thickening.   At the T12-L1 level, L1-2 level and L2-3 level, there is no evidence of  thecal sac stenosis or neural foraminal narrowing.   At the L3-4 level, there is no evidence of thecal sac stenosis or neural  foraminal narrowing.   At the L4-5 level, a broad-based disc bulge is appreciated causing partial  effacement of the anterior CSF space. This contributes to multifactorial  moderate thecal sac stenosis and bilateral  mild neural foraminal narrowing  right greater than left. The thecal sac stenosis is also secondary to  ligamentum flavum and facet hypertrophy.   At the L5-S1 level, there is no evidence of thecal sac stenosis or neural  foraminal narrowing.   IMPRESSION:   1.     Moderate thecal sac stenosis at the L4-5 level with mild bilateral  neural foraminal narrowing.  Mild exiting nerve root compromise  bilaterally  is a diagnostic consideration.  2.     No further evidence of focal or acute pathology.   Thank you for the opportunity to contribute to the care of your patient.       Lumbar MR w/wo contrast:  Results for orders placed during the hospital encounter of 12/31/16  MR LUMBAR SPINE W WO CONTRAST   Narrative CLINICAL DATA:  Chronic back pain and bilateral sciatica. Failed  back surgical syndrome.  EXAM: MRI LUMBAR SPINE WITHOUT AND WITH CONTRAST  TECHNIQUE: Multiplanar and multiecho pulse sequences of the lumbar spine were obtained without and with intravenous contrast.  CONTRAST:  41m MULTIHANCE GADOBENATE DIMEGLUMINE 529 MG/ML IV SOLN  COMPARISON:  CT scan of the abdomen and pelvis dated 06/03/2016 and lumbar MRI dated 11/27/2009  FINDINGS: Segmentation:  Standard.  Alignment:  Physiologic.  Vertebrae: Multiple small stable hemangiomata in the spine. Solid interbody fusion at L4-5 with posterior decompression.  Conus medullaris: Extends to the L1-2 level and appears normal.  Paraspinal and other soft tissues: Negative except for chronic postsurgical changes posteriorly at L4-5.  Disc levels:  T11-12 through L1-2:  Negative.  L2-3: The disc is normal. Slight hypertrophy of the ligamentum flavum. Minimal narrowing of the spinal canal.  L3-4: Progressive now severe bilateral facet arthritis with ligamentum flavum hypertrophy and bilateral joint effusions with severe spinal stenosis. Small broad-based soft disc protrusion contributes to compression of the  thecal sac. Moderate bilateral foraminal stenosis.  L4-5: Solid interbody fusion. Excellent posterior decompression. Enhancing scar tissue around the thecal sac to the expected degree. No focal neural impingement. Widely patent neural foramina.  L5-S1: Normal disc. Slight bilateral facet arthritis, left more than right, slightly progressed.  IMPRESSION: 1. Progressive now severe bilateral facet arthritis at L3-4 with new severe spinal stenosis at L3-4. Thecal sac dimensions at minimum are 11.7 x 7.8 mm on image 22 of series 6. 2. Stable postsurgical changes at L4-5.   Electronically Signed   By: JLorriane ShireM.D.   On: 12/31/2016 14:46    Lumbar DG 2-3 views:  Results for orders placed during the hospital encounter of 05/23/09  DG Lumbar Spine 2-3 Views   Narrative Clinical Data: L4-5 laminectomy and fusion   LUMBAR SPINE - 2-3 VIEW   Comparison: None   Findings: Two C-arm images show posterior decompression with fusion at L4-5.  There are pedicle screws, posterior rods and interbody fusion material.  No radiographically detectable complication.   IMPRESSION: PLIF L4-5  Provider: JMila Palmer JMorrell Riddle  Knee Imaging: Knee-L MR w contrast:  Results for orders placed in visit on 06/08/06  MR Knee Left  Wo Contrast   Narrative * PRIOR REPORT IMPORTED FROM AN EXTERNAL SYSTEM *   PRIOR REPORT IMPORTED FROM THE [facility] WORKFLOW SYSTEM   REASON FOR EXAM:   Bilateral knee pain  COMMENTS:   PROCEDURE:     MR  - MR KNEE LT  WO CONTRAST  - Jun 08 2006  9:37AM   RESULT:        The patient is complaining of knee discomfort.   Multiplanar images were obtained through the knee.  There is a small  effusion.  There is a moderate size popliteal cyst measuring approximately  1.9 cm in diameter.  The fluid sensitive sequences reveal no evidence of  bone marrow edema.  There is thinning of the articular cartilage of the  patella and the patella is displaced slightly  laterally in the patellar  notch of the femur.  The medial and lateral collateral ligament complexes  are intact.  The ACL and PCL are intact.  The medial and lateral menisci  exhibit no evidence of a tear.  The quadriceps and patellar tendons are  intact.  There are small osteophytes noted off the superior and inferior  margins of the patella and there are small osteophytes with the femoral  condyles.   IMPRESSION:   1.  There is no evidence of a meniscal tear or cruciate ligament injury  or collateral ligament injury.  2.     There is a moderate size popliteal cyst and small joint effusion.  3.     There is no evidence of bone marrow edema.  4.     Mild degenerative osteophyte formation is seen in all three joint  compartments.  5.     There is mild thinning of the articular cartilage of the patella  and  slight lateral subluxation of the patella with respect to the underlying  femur.   Thank you for this opportunity to contribute to the care of your patient.       Knee-R MR wo contrast:  Results for orders placed in visit on 06/08/06  MR Knee Right Wo Contrast   Narrative * PRIOR REPORT IMPORTED FROM AN EXTERNAL SYSTEM *   PRIOR REPORT IMPORTED FROM THE [facility] WORKFLOW SYSTEM   REASON FOR EXAM:  bilateral knee pain  COMMENTS:   PROCEDURE:     MR  - MR KNEE RT  WO  CONTRAST  - Jun 08 2006  9:37AM   RESULT:          Multiplanar images were obtained through the knee.  The  patient has a prior diagnosis of chondromalacia patella.   The quadriceps and patellar tendons are intact.  There is thinning of the  articular cartilage of the patella with a small amount of subchondral  increased signal seen best on image #5 of the axial sequences.  The  patella  appears to be somewhat laterally subluxed, and there is associated  degenerative change of the lateral aspect of the patellar facet.  Small  osteophytes are noted on other marginal portions of the femoral condyles.   I    do not see evidence of acute bone marrow edema.  There is a moderate-sized  effusion.  No significant popliteal cyst is evident.  The medial and  lateral  collateral ligament complexes are intact.   There is increased signal in the posterior horn of the lateral meniscus  consistent with degenerative change and likely a tiny tear reaching the  inferior joint surface.  The ACL and PCL are intact.  There is some fluid  associated with the ACL, but I do not see objective evidence of a tear.   The  medial meniscus demonstrates very minimal increased intrasubstance signal  both anteriorly and posteriorly, but no frank tear is seen.   IMPRESSION:   1.     There are degenerative changes of the patellofemoral joint  consistent  with the known chondromalacia patella.  I do not see more than minimal  subchondral increased marrow signal.  Slight lateral subluxation of the  patella with respect to the lateral femoral condyle is present.  2.     The cruciate and collateral ligaments are intact.  3.     There are mild degenerative-type signal changes in both menisci,  but  there is evidence of a tiny tear in the posterior horn of the lateral  meniscus.  4.     There is a small effusion.   Thank you for this opportunity to contribute to the care of your patient.       Knee-L DG 1-2 views:  Results for orders placed in visit on 09/24/13  DG Knee 1-2 Views Left   Narrative * PRIOR REPORT IMPORTED FROM AN EXTERNAL SYSTEM *   PRIOR REPORT IMPORTED FROM THE [facility]  WORKFLOW SYSTEM   REASON FOR EXAM:    postop  COMMENTS:   Bedside (portable):Y   PROCEDURE:     DXR - DXR KNEE LEFT AP AND LATERAL  - Sep 24 2013 11:28AM   RESULT:     AP and lateral portable views performed in the recovery room  reveal the patient has undergone left total knee joint prosthesis  placement.  Radiographic positioning of the prosthetic components is good. There are  surgical drain lines and skin staples present.    IMPRESSION:      The patient has undergone left knee joint prosthesis  placement. Further interpretation is deferred to Dr. Marry Guan.   Dictation Site: 2       Note: Results of ordered imaging test(s) reviewed and explained to patient in Layman's terms. Copy of results provided to patient  Meds  The patient has a current medication list which includes the following prescription(s): albuterol, alprazolam, atenolol, butalbital-acetaminophen-caffeine, vitamin d3, clonidine, cyclobenzaprine, diclofenac sodium, doxazosin, escitalopram, fluticasone, gabapentin, gentamicin, hydralazine, hydrochlorothiazide, ibuprofen, insulin detemir, insulin pen needle, insulin regular human concentrated, lidocaine, linaclotide, lisinopril, metoclopramide, mometasone, ondansetron, pantoprazole, potassium chloride, pramipexole, torsemide, tramadol, trazodone, vitamin d (ergocalciferol), and zolpidem.  Current Outpatient Prescriptions on File Prior to Visit  Medication Sig  . albuterol (VENTOLIN HFA) 108 (90 Base) MCG/ACT inhaler TAKE 2 PUFFS BY MOUTH 3 TIMES A DAY AS NEEDED  . ALPRAZolam (XANAX) 0.5 MG tablet Take 0.5 mg by mouth 5 (five) times daily.  Marland Kitchen atenolol (TENORMIN) 100 MG tablet Take 100 mg by mouth daily.   . butalbital-acetaminophen-caffeine (FIORICET, ESGIC) 50-325-40 MG tablet TAKE ONE TABLET BY MOUTH 2 TIMES A DAY AS NEEDED  . cloNIDine (CATAPRES - DOSED IN MG/24 HR) 0.2 mg/24hr patch 0.3 patches. Frequency:1XW   Dosage:0.0     Instructions:  Note:Dose: 0.2MG/24H  . diclofenac sodium (VOLTAREN) 1 % GEL Apply topically as needed.   . doxazosin (CARDURA) 4 MG tablet Take 4 mg by mouth daily.   Marland Kitchen escitalopram (LEXAPRO) 10 MG tablet Take 10 mg by mouth daily.   . fluticasone (FLONASE) 50 MCG/ACT nasal spray PLACE 2 SPRAYS INTO BOTH NOSTRILS 2 (TWO) TIMES DAILY.  . hydrALAZINE (APRESOLINE) 25 MG tablet Take 50 mg by mouth 2 (two) times daily.   . hydrochlorothiazide (HYDRODIURIL) 25 MG tablet Take 25 mg  by mouth as needed.   Marland Kitchen ibuprofen (ADVIL,MOTRIN) 800 MG tablet Take 800 mg by mouth 3 (three) times daily.   . Insulin Detemir (LEVEMIR FLEXTOUCH) 100 UNIT/ML Pen INJECT 80 UNITS SUBCUTANEOUSLY TWICE A DAY  . Insulin Pen Needle (FIFTY50 PEN NEEDLES) 32G X 6 MM MISC 5 (five) times daily.  . insulin regular human CONCENTRATED (HUMULIN R) 500 UNIT/ML kwikpen Inject 65 Units into the skin 3 (three) times daily with meals.   . lidocaine (LIDODERM) 5 % PLACE 1 PATCH ONTO THE MOST PAINFUL AREA OF SKIN DAILY FOR UP TO 12 HOURS IN A 24 HOUR PERIOD  . linaclotide (LINZESS) 290 MCG CAPS capsule Take 290 mcg by mouth as needed.   Marland Kitchen lisinopril (PRINIVIL,ZESTRIL) 40 MG tablet TAKE 1 TABLET (40 MG TOTAL) BY MOUTH ONCE DAILY.  Marland Kitchen metoCLOPramide (REGLAN) 10 MG tablet Take 10 mg by mouth 3 (three) times daily.  . mometasone (ELOCON) 0.1 % lotion APPLY TWICE A DAY as needed  . ondansetron (ZOFRAN) 4 MG tablet Take 4 mg by mouth every 8 (eight) hours as needed for nausea.   . pantoprazole (PROTONIX) 40 MG tablet TAKE 1 TABLET (40 MG  TOTAL) BY MOUTH 2 (TWO) TIMES DAILY. TAKE ONE HOUR BEFORE A MEAL  . potassium chloride (K-DUR,KLOR-CON) 10 MEQ tablet TAKE 1 TABLET BY MOUTH ONCE A DAY  . pramipexole (MIRAPEX) 0.5 MG tablet TAKE 1 TABLET (0.5 MG TOTAL) BY MOUTH 2 (TWO) TIMES DAILY.  Marland Kitchen torsemide (DEMADEX) 20 MG tablet Take 20 mg by mouth as needed.   . traZODone (DESYREL) 150 MG tablet Take 150 mg by mouth at bedtime.   Marland Kitchen zolpidem (AMBIEN) 10 MG tablet Take 10 mg by mouth at bedtime.    No current facility-administered medications on file prior to visit.    ROS  Constitutional: Denies any fever or chills Gastrointestinal: No reported hemesis, hematochezia, vomiting, or acute GI distress Musculoskeletal: Denies any acute onset joint swelling, redness, loss of ROM, or weakness Neurological: No reported episodes of acute onset apraxia, aphasia, dysarthria, agnosia, amnesia, paralysis, loss of coordination, or loss of  consciousness  Allergies  Ms. Page is allergic to amlodipine; imdur [isosorbide dinitrate]; isosorbide mononitrate [isosorbide dinitrate er]; lyrica [pregabalin]; monosodium glutamate; norvasc [amlodipine besylate]; shellfish allergy; tape; and eggs or egg-derived products.  [place]  Drug: Ms. Candy  reports that she does not use drugs. Alcohol:  reports that she does not drink alcohol. Tobacco:  reports that she has never smoked. She has never used smokeless tobacco. Medical:  has a past medical history of Abdominal pain, left upper quadrant (12/11/2012); Arthritis; Asthma; Cancer ([place]); Collagen vascular disease ([place]); Diabetes mellitus without complication ([place]); Diabetic nephropathy associated with secondary diabetes mellitus ([place]); Flushing (12/11/2012); Gastroparesis; Gross hematuria (12/11/2012); Hepatic cirrhosis ([place]); Hyperlipidemia; Hypertension; Kidney stone (12/11/2012); Morbid obesity with BMI of 40.0-44.9, adult ([place]); Nausea without vomiting (12/11/2012); Nephrolithiasis; Nephrolithiasis (04/26/2014); Nonproliferative retinopathy due to secondary diabetes ([place]); Peripheral vascular disease ([place]); Renal colic (76/28/3151); Sciatica; Sciatica (12/11/2012); Sleep apnea; and Unilateral small kidney without contralateral hypertrophy. Family: family history includes Asthma in her mother; Cancer in her mother; Diabetes in her father; Heart disease in her mother; Hypertension in her father; Kidney disease in her father.  Past Surgical History:  Procedure Laterality Date  . ABDOMINAL HYSTERECTOMY    . BACK SURGERY  04/1999   L-4-5 LAMINECTOMY  . CESAREAN SECTION  1980  . COLONOSCOPY  05/04/2001  . COLONOSCOPY WITH PROPOFOL N/A 09/20/2016   Procedure: COLONOSCOPY WITH PROPOFOL;  Surgeon: Lollie Sails, MD;  Location: [facility] ENDOSCOPY;  Service: Endoscopy;  Laterality: N/A;  . DILATION AND CURETTAGE OF UTERUS    . ESOPHAGOGASTRODUODENOSCOPY  02/08/2014  . ESOPHAGOGASTRODUODENOSCOPY (EGD)  WITH PROPOFOL N/A 09/20/2016   Procedure: ESOPHAGOGASTRODUODENOSCOPY (EGD) WITH PROPOFOL;  Surgeon: Lollie Sails, MD;  Location: [facility] ENDOSCOPY;  Service: Endoscopy;  Laterality: N/A;  . EYE SURGERY  1986   FOR MELANOMA  . JOINT REPLACEMENT Left 09/24/2013   TOTAL KNEE  . MOHS SURGERY  11/2016   left side of nose  . TONSILLECTOMY     WITH ADENOIDECTOMY   Constitutional Exam  General appearance: Well nourished, well developed, and well hydrated. In no apparent acute distress Vitals:   01/18/17 0752  BP: (!) 161/64  Pulse: 73  Resp: 16  Temp: 98.5 F (36.9 C)  TempSrc: Oral  SpO2: 94%  Weight: 278 lb (126.1 kg)  Height: '5\' 6"'  (1.676 m)   BMI Assessment: Estimated body mass index is 44.87 kg/m as calculated from the following:   Height as of this encounter: '5\' 6"'  (1.676 m).   Weight as of this encounter: 278 lb (126.1 kg).  BMI interpretation table: BMI  level Category Range association with higher incidence of chronic pain  <18 kg/m2 Underweight   18.5-24.9 kg/m2 Ideal body weight   25-29.9 kg/m2 Overweight Increased incidence by 20%  30-34.9 kg/m2 Obese (Class I) Increased incidence by 68%  35-39.9 kg/m2 Severe obesity (Class II) Increased incidence by 136%  >40 kg/m2 Extreme obesity (Class III) Increased incidence by 254%   BMI Readings from Last 4 Encounters:  01/18/17 44.87 kg/m  12/07/16 44.22 kg/m  11/21/16 41.50 kg/m  09/20/16 43.42 kg/m   Wt Readings from Last 4 Encounters:  01/18/17 278 lb (126.1 kg)  12/07/16 274 lb (124.3 kg)  11/21/16 265 lb (120.2 kg)  09/20/16 269 lb (122 kg)  Psych/Mental status: Alert, oriented x 3 (person, place, & time)       Eyes: PERLA Respiratory: No evidence of acute respiratory distress  Cervical Spine Exam  Inspection: No masses, redness, or swelling Alignment: Symmetrical Functional ROM: Unrestricted ROM Stability: No instability detected Muscle strength & Tone: Functionally intact Sensory:  Unimpaired Palpation: Non-contributory  Upper Extremity (UE) Exam    Side: Right upper extremity  Side: Left upper extremity  Inspection: No masses, redness, swelling, or asymmetry  Inspection: No masses, redness, swelling, or asymmetry  Functional ROM: Unrestricted ROM          Functional ROM: Unrestricted ROM          Muscle strength & Tone: Functionally intact  Muscle strength & Tone: Functionally intact  Sensory: Unimpaired  Sensory: Unimpaired  Palpation: Non-contributory  Palpation: Non-contributory   Thoracic Spine Exam  Inspection: No masses, redness, or swelling Alignment: Symmetrical Functional ROM: Unrestricted ROM Stability: No instability detected Sensory: Unimpaired Muscle strength & Tone: Functionally intact Palpation: Non-contributory  Lumbar Spine Exam  Inspection: No masses, redness, or swelling Alignment: Symmetrical Functional ROM: Unrestricted ROM Stability: No instability detected Muscle strength & Tone: Functionally intact Sensory: Unimpaired Palpation: Non-contributory Provocative Tests: Lumbar Hyperextension and rotation test: evaluation deferred today       Patrick's Maneuver: evaluation deferred today              Gait & Posture Assessment  Ambulation: Unassisted Gait: Relatively normal for age and body habitus Posture: WNL   Lower Extremity Exam    Side: Right lower extremity  Side: Left lower extremity  Inspection: No masses, redness, swelling, or asymmetry  Inspection: No masses, redness, swelling, or asymmetry  Functional ROM: Unrestricted ROM          Functional ROM: Unrestricted ROM          Muscle strength & Tone: Functionally intact  Muscle strength & Tone: Functionally intact  Sensory: Unimpaired  Sensory: Unimpaired  Palpation: Non-contributory  Palpation: Non-contributory   Assessment & Plan  Primary Diagnosis & Pertinent Problem List: The primary encounter diagnosis was L3-4 severe lumbar spinal stenosis (12/31/2016 MRI). Diagnoses  of L3-4 severe lumbar facet hypertrophy and spinal stenosis, Chronic low back pain (Location of Primary Source of Pain) (Bilateral) (R>L), Chronic lower extremity pain (Location of Secondary source of pain) (Bilateral) (R>L), Chronic knee pain (Location of Tertiary source of pain) (Right), Chronic pain syndrome, Fibromyalgia, Generalized osteoarthritis of multiple sites, Morbid obesity with BMI of 40.0-44.9, adult (HCC), Neurogenic pain, Long term prescription benzodiazepine use, and Vitamin D insufficiency were also pertinent to this visit.  Visit Diagnosis: 1. L3-4 severe lumbar spinal stenosis (12/31/2016 MRI)   2. 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 Chronic low back pain (Location of Primary Source of Pain) (Bilateral) (R>L)  4. Chronic lower extremity pain (Location of Secondary source of pain) (Bilateral) (R>L)   5. Chronic knee pain (Location of Tertiary source of pain) (Right)   6. Chronic pain syndrome   7. Fibromyalgia   8. Generalized osteoarthritis of multiple sites   9. Morbid obesity with BMI of 40.0-44.9, adult ([place])   10. Neurogenic pain   11. Long term prescription benzodiazepine use   12. Vitamin D insufficiency    Problems updated and reviewed during this visit: Problem  Long Term Prescription Benzodiazepine Use  Vitamin D Insufficiency   Problem-specific Plan(s): No problem-specific Assessment & Plan notes found for this encounter.  Assessment & plan notes cannot be loaded without a specified hospital service.  Plan of Care  Pharmacotherapy (Medications Ordered): Meds ordered this encounter  Medications  . gabapentin (NEURONTIN) 600 MG tablet    Sig: Take 1 tablet (600 mg total) by mouth 6 (six) times daily.    Dispense:  540 tablet    Refill:  0    Do not place medication on "Automatic Refill". Fill one day early if pharmacy is closed on scheduled refill date.  . traMADol (ULTRAM) 50 MG tablet    Sig: Take 2 tablets (100 mg total) by  mouth every 8 (eight) hours.    Dispense:  180 tablet    Refill:  2    Fill one day early if pharmacy is closed on scheduled refill date. Do not fill until: 01/18/17 To last until: 04/18/17  . cyclobenzaprine (FLEXERIL) 10 MG tablet    Sig: Take 1 tablet (10 mg total) by mouth 3 (three) times daily as needed for muscle spasms.    Dispense:  270 tablet    Refill:  0    Do not place medication on "Automatic Refill". Fill one day early if pharmacy is closed on scheduled refill date.  . Vitamin D, Ergocalciferol, (DRISDOL) 50000 units CAPS capsule    Sig: Take 1 capsule (50,000 Units total) by mouth 2 (two) times a week x 6 weeks.    Dispense:  12 capsule    Refill:  0    Do not add to the "Automatic Refill" notification system.  . Cholecalciferol (VITAMIN D3) 2000 units capsule    Sig: Take 1 capsule (2,000 Units total) by mouth daily.    Dispense:  30 capsule    Refill:  PRN    Do not add to the "Automatic Refill" notification system.   Lab-work, procedure(s), and/or referral(s): Orders Placed This Encounter  Procedures  . Lumbar Epidural Injection  . Ambulatory referral to Neurosurgery  . Amb ref to Medical Nutrition Therapy-MNT  . Ambulatory referral to General Surgery  . Ambulatory referral to Psychiatry    Pharmacotherapy: Opioid Analgesics: We'll take over management today. See above orders Membrane stabilizer: We have discussed the possibility of optimizing this mode of therapy, if tolerated Muscle relaxant: We have discussed the possibility of a trial NSAID: We have discussed the possibility of a trial Other analgesic(s): To be determined at a later time   Interventional therapies: Planned, scheduled, and/or pending:    Diagnostic Right L2-3 LESI under fluoro   Considering:   Diagnostic caudal epidural steroid injection + diagnostic epidurogram  Possible Racz procedure  Diagnostic right-sided translaminar lumbar epidural steroid injections Diagnostic bilateral  transforaminal epidural steroid injections Diagnostic bilateral lumbar facet block under fluoroscopic guidance  Possible bilateral lumbar facet RFA  Diagnostic right intra-articular knee joint injection with local anesthetic and steroids  Possible series of 5  right-sided Hyalgan intra-articular knee injections  Diagnostic right-sided genicular nerve block under fluoroscopic guidance  Possible right-sided genicular nerve radiofrequency ablation    PRN Procedures:   To be determined at a later time   Provider-requested follow-up: Return in about 3 months (around 04/18/2017) for procedure (ASAA).  Future Appointments Date Time Provider [place]  01/24/2017 8:30 AM Milinda Pointer, MD [facility] None    Primary Care Physician: Rusty Aus, MD Location: [facility] Outpatient [facility] Management Facility Note by: Kathlen Brunswick. Dossie Arbour, M.D, DABA, DABAPM, DABPM, DABIPP, FIPP Date: 01/18/2017; Time: 2:31 PM  Pain Score Disclaimer: We use the NRS-11 scale. This is a self-reported, subjective measurement of pain severity with only modest accuracy. It is used primarily to identify changes within a particular patient. It must be understood that outpatient pain scales are significantly less accurate that those used for research, where they can be applied under ideal controlled circumstances with minimal exposure to variables. In reality, the score is likely to be a combination of pain intensity and pain affect, where pain affect describes the degree of emotional arousal or changes in action readiness caused by the sensory experience of pain. Factors such as social and work situation, setting, emotional state, anxiety levels, expectation, and prior pain experience may influence pain perception and show large inter-individual differences that may also be affected by time variables.  Patient instructions provided during this appointment: Patient Instructions   GENERAL RISKS AND COMPLICATIONS  What are  the risk, side effects and possible complications? Generally speaking, most procedures are safe.  However, with any procedure there are risks, side effects, and the possibility of complications.  The risks and complications are dependent upon the sites that are lesioned, or the type of nerve block to be performed.  The closer the procedure is to the spine, the more serious the risks are.  Great care is taken when placing the radio frequency needles, block needles or lesioning probes, but sometimes complications can occur. 1. Infection: Any time there is an injection through the skin, there is a risk of infection.  This is why sterile conditions are used for these blocks.  There are four possible types of infection. 1. Localized skin infection. 2. Central Nervous System Infection-This can be in the form of Meningitis, which can be deadly. 3. Epidural Infections-This can be in the form of an epidural abscess, which can cause pressure inside of the spine, causing compression of the spinal cord with subsequent paralysis. This would require an emergency surgery to decompress, and there are no guarantees that the patient would recover from the paralysis. 4. Discitis-This is an infection of the intervertebral discs.  It occurs in about 1% of discography procedures.  It is difficult to treat and it may lead to surgery.        2. Pain: the needles have to go through skin and soft tissues, will cause soreness.       3. Damage to internal structures:  The nerves to be lesioned may be near blood vessels or    other nerves which can be potentially damaged.       4. Bleeding: Bleeding is more common if the patient is taking blood thinners such as  aspirin, Coumadin, Ticiid, Plavix, etc., or if he/she have some genetic predisposition  such as hemophilia. Bleeding into the spinal canal can cause compression of the spinal  cord with subsequent paralysis.  This would require an emergency surgery to  decompress and there  are no guarantees that  the patient would recover from the  paralysis.       5. Pneumothorax:  Puncturing of a lung is a possibility, every time a needle is introduced in  the area of the chest or upper back.  Pneumothorax refers to free air around the  collapsed lung(s), inside of the thoracic cavity (chest cavity).  Another two possible  complications related to a similar event would include: Hemothorax and Chylothorax.   These are variations of the Pneumothorax, where instead of air around the collapsed  lung(s), you may have blood or chyle, respectively.       6. Spinal headaches: They may occur with any procedures in the area of the spine.       7. Persistent CSF (Cerebro-Spinal Fluid) leakage: This is a rare problem, but may occur  with prolonged intrathecal or epidural catheters either due to the formation of a fistulous  track or a dural tear.       8. Nerve damage: By working so close to the spinal cord, there is always a possibility of  nerve damage, which could be as serious as a permanent spinal cord injury with  paralysis.       9. Death:  Although rare, severe deadly allergic reactions known as "Anaphylactic  reaction" can occur to any of the medications used.      10. Worsening of the symptoms:  We can always make thing worse.  What are the chances of something like this happening? Chances of any of this occuring are extremely low.  By statistics, you have more of a chance of getting killed in a motor vehicle accident: while driving to the hospital than any of the above occurring .  Nevertheless, you should be aware that they are possibilities.  In general, it is similar to taking a shower.  Everybody knows that you can slip, hit your head and get killed.  Does that mean that you should not shower again?  Nevertheless always keep in mind that statistics do not mean anything if you happen to be on the wrong side of them.  Even if a procedure has a 1 (one) in a 1,000,000 (million) chance of  going wrong, it you happen to be that one..Also, keep in mind that by statistics, you have more of a chance of having something go wrong when taking medications.  Who should not have this procedure? If you are on a blood thinning medication (e.g. Coumadin, Plavix, see list of "Blood Thinners"), or if you have an active infection going on, you should not have the procedure.  If you are taking any blood thinners, please inform your physician.  How should I prepare for this procedure?  Do not eat or drink anything at least six hours prior to the procedure.  Bring a driver with you .  It cannot be a taxi.  Come accompanied by an adult that can drive you back, and that is strong enough to help you if your legs get weak or numb from the local anesthetic.  Take all of your medicines the morning of the procedure with just enough water to swallow them.  If you have diabetes, make sure that you are scheduled to have your procedure done first thing in the morning, whenever possible.  If you have diabetes, take only half of your insulin dose and notify our nurse that you have done so as soon as you arrive at the clinic.  If you are diabetic, but only take blood sugar  pills (oral hypoglycemic), then do not take them on the morning of your procedure.  You may take them after you have had the procedure.  Do not take aspirin or any aspirin-containing medications, at least eleven (11) days prior to the procedure.  They may prolong bleeding.  Wear loose fitting clothing that may be easy to take off and that you would not mind if it got stained with Betadine or blood.  Do not wear any jewelry or perfume  Remove any nail coloring.  It will interfere with some of our monitoring equipment.  NOTE: Remember that this is not meant to be interpreted as a complete list of all possible complications.  Unforeseen problems may occur.  BLOOD THINNERS The following drugs contain aspirin or other products, which can  cause increased bleeding during surgery and should not be taken for 2 weeks prior to and 1 week after surgery.  If you should need take something for relief of minor pain, you may take acetaminophen which is found in Tylenol,m Datril, Anacin-3 and Panadol. It is not blood thinner. The products listed below are.  Do not take any of the products listed below in addition to any listed on your instruction sheet.  A.P.C or A.P.C with Codeine Codeine Phosphate Capsules #3 Ibuprofen Ridaura  ABC compound Congesprin Imuran rimadil  Advil Cope Indocin Robaxisal  Alka-Seltzer Effervescent Pain Reliever and Antacid Coricidin or Coricidin-D  Indomethacin Rufen  Alka-Seltzer plus Cold Medicine Cosprin Ketoprofen S-A-C Tablets  Anacin Analgesic Tablets or Capsules Coumadin Korlgesic Salflex  Anacin Extra Strength Analgesic tablets or capsules CP-2 Tablets Lanoril Salicylate  Anaprox Cuprimine Capsules Levenox Salocol  Anexsia-D Dalteparin Magan Salsalate  Anodynos Darvon compound Magnesium Salicylate Sine-off  Ansaid Dasin Capsules Magsal Sodium Salicylate  Anturane Depen Capsules Marnal Soma  APF Arthritis pain formula Dewitt's Pills Measurin Stanback  Argesic Dia-Gesic Meclofenamic Sulfinpyrazone  Arthritis Bayer Timed Release Aspirin Diclofenac Meclomen Sulindac  Arthritis pain formula Anacin Dicumarol Medipren Supac  Analgesic (Safety coated) Arthralgen Diffunasal Mefanamic Suprofen  Arthritis Strength Bufferin Dihydrocodeine Mepro Compound Suprol  Arthropan liquid Dopirydamole Methcarbomol with Aspirin Synalgos  ASA tablets/Enseals Disalcid Micrainin Tagament  Ascriptin Doan's Midol Talwin  Ascriptin A/D Dolene Mobidin Tanderil  Ascriptin Extra Strength Dolobid Moblgesic Ticlid  Ascriptin with Codeine Doloprin or Doloprin with Codeine Momentum Tolectin  Asperbuf Duoprin Mono-gesic Trendar  Aspergum Duradyne Motrin or Motrin IB Triminicin  Aspirin plain, buffered or enteric coated Durasal  Myochrisine Trigesic  Aspirin Suppositories Easprin Nalfon Trillsate  Aspirin with Codeine Ecotrin Regular or Extra Strength Naprosyn Uracel  Atromid-S Efficin Naproxen Ursinus  Auranofin Capsules Elmiron Neocylate Vanquish  Axotal Emagrin Norgesic Verin  Azathioprine Empirin or Empirin with Codeine Normiflo Vitamin E  Azolid Emprazil Nuprin Voltaren  Bayer Aspirin plain, buffered or children's or timed BC Tablets or powders Encaprin Orgaran Warfarin Sodium  Buff-a-Comp Enoxaparin Orudis Zorpin  Buff-a-Comp with Codeine Equegesic Os-Cal-Gesic   Buffaprin Excedrin plain, buffered or Extra Strength Oxalid   Bufferin Arthritis Strength Feldene Oxphenbutazone   Bufferin plain or Extra Strength Feldene Capsules Oxycodone with Aspirin   Bufferin with Codeine Fenoprofen Fenoprofen Pabalate or Pabalate-SF   Buffets II Flogesic Panagesic   Buffinol plain or Extra Strength Florinal or Florinal with Codeine Panwarfarin   Buf-Tabs Flurbiprofen Penicillamine   Butalbital Compound Four-way cold tablets Penicillin   Butazolidin Fragmin Pepto-Bismol   Carbenicillin Geminisyn Percodan   Carna Arthritis Reliever Geopen Persantine   Carprofen Gold's salt Persistin   Chloramphenicol Goody's Phenylbutazone   Chloromycetin Haltrain Piroxlcam  Clmetidine heparin Plaquenil   Cllnoril Hyco-pap Ponstel   Clofibrate Hydroxy chloroquine Propoxyphen         Before stopping any of these medications, be sure to consult the physician who ordered them.  Some, such as Coumadin (Warfarin) are ordered to prevent or treat serious conditions such as "deep thrombosis", "pumonary embolisms", and other heart problems.  The amount of time that you may need off of the medication may also vary with the medication and the reason for which you were taking it.  If you are taking any of these medications, please make sure you notify your pain physician before you undergo any procedures.         Epidural Steroid  Injection An epidural steroid injection is a shot of steroid medicine and numbing medicine that is given into the space between the spinal cord and the bones in your back (epidural space). The shot helps relieve pain caused by an irritated or swollen nerve root. The amount of pain relief you get from the injection depends on what is causing the nerve to be swollen and irritated, and how long your pain lasts. You are more likely to benefit from this injection if your pain is strong and comes on suddenly rather than if you have had pain for a long time. Tell a health care provider about:  Any allergies you have.  All medicines you are taking, including vitamins, herbs, eye drops, creams, and over-the-counter medicines.  Any problems you or family members have had with anesthetic medicines.  Any blood disorders you have.  Any surgeries you have had.  Any medical conditions you have.  Whether you are pregnant or may be pregnant. What are the risks? Generally, this is a safe procedure. However, problems may occur, including:  Headache.  Bleeding.  Infection.  Allergic reaction to medicines.  Damage to your nerves. What happens before the procedure? Staying hydrated  Follow instructions from your health care provider about hydration, which may include:  Up to 2 hours before the procedure - you may continue to drink clear liquids, such as water, clear fruit juice, black coffee, and plain tea. Eating and drinking restrictions  Follow instructions from your health care provider about eating and drinking, which may include:  8 hours before the procedure - stop eating heavy meals or foods such as meat, fried foods, or fatty foods.  6 hours before the procedure - stop eating light meals or foods, such as toast or cereal.  6 hours before the procedure - stop drinking milk or drinks that contain milk.  2 hours before the procedure - stop drinking clear liquids. Medicine  You may be  given medicines to lower anxiety.  Ask your health care provider about:  Changing or stopping your regular medicines. This is especially important if you are taking diabetes medicines or blood thinners.  Taking medicines such as aspirin and ibuprofen. These medicines can thin your blood. Do not take these medicines before your procedure if your health care provider instructs you not to. General instructions  Plan to have someone take you home from the hospital or clinic. What happens during the procedure?  You may receive a medicine to help you relax (sedative).  You will be asked to lie on your abdomen.  The injection site will be cleaned.  A numbing medicine (local anesthetic) will be used to numb the injection site.  A needle will be inserted through your skin into the epidural space. You may feel some discomfort  when this happens. An X-ray machine will be used to make sure the needle is put as close as possible to the affected nerve.  A steroid medicine and a local anesthetic will be injected into the epidural space.  The needle will be removed.  A bandage (dressing) will be put over the injection site. What happens after the procedure?  Your blood pressure, heart rate, breathing rate, and blood oxygen level will be monitored until the medicines you were given have worn off.  Your arm or leg may feel weak or numb for a few hours.  The injection site may feel sore.  Do not drive for 24 hours if you received a sedative. This information is not intended to replace advice given to you by your health care provider. Make sure you discuss any questions you have with your health care provider. Document Released: 03/21/2008 Document Revised: 05/26/2016 Document Reviewed: 03/30/2016 Elsevier Interactive Patient Education  2017 [employer].

## 2017-01-18 ENCOUNTER — Ambulatory Visit: Payer: Medicare Other | Attending: Pain Medicine | Admitting: Pain Medicine

## 2017-01-18 ENCOUNTER — Encounter: Payer: Self-pay | Admitting: Pain Medicine

## 2017-01-18 VITALS — BP 161/64 | HR 73 | Temp 98.5°F | Resp 16 | Ht 66.0 in | Wt 278.0 lb

## 2017-01-18 DIAGNOSIS — M5442 Lumbago with sciatica, left side: Secondary | ICD-10-CM

## 2017-01-18 DIAGNOSIS — N182 Chronic kidney disease, stage 2 (mild): Secondary | ICD-10-CM | POA: Insufficient documentation

## 2017-01-18 DIAGNOSIS — E1151 Type 2 diabetes mellitus with diabetic peripheral angiopathy without gangrene: Secondary | ICD-10-CM | POA: Diagnosis not present

## 2017-01-18 DIAGNOSIS — M48061 Spinal stenosis, lumbar region without neurogenic claudication: Secondary | ICD-10-CM

## 2017-01-18 DIAGNOSIS — M79604 Pain in right leg: Secondary | ICD-10-CM

## 2017-01-18 DIAGNOSIS — M47816 Spondylosis without myelopathy or radiculopathy, lumbar region: Secondary | ICD-10-CM

## 2017-01-18 DIAGNOSIS — M159 Polyosteoarthritis, unspecified: Secondary | ICD-10-CM | POA: Diagnosis not present

## 2017-01-18 DIAGNOSIS — G894 Chronic pain syndrome: Secondary | ICD-10-CM

## 2017-01-18 DIAGNOSIS — E1122 Type 2 diabetes mellitus with diabetic chronic kidney disease: Secondary | ICD-10-CM | POA: Insufficient documentation

## 2017-01-18 DIAGNOSIS — K3184 Gastroparesis: Secondary | ICD-10-CM | POA: Diagnosis not present

## 2017-01-18 DIAGNOSIS — E1143 Type 2 diabetes mellitus with diabetic autonomic (poly)neuropathy: Secondary | ICD-10-CM | POA: Insufficient documentation

## 2017-01-18 DIAGNOSIS — G8929 Other chronic pain: Secondary | ICD-10-CM | POA: Diagnosis not present

## 2017-01-18 DIAGNOSIS — E11319 Type 2 diabetes mellitus with unspecified diabetic retinopathy without macular edema: Secondary | ICD-10-CM | POA: Insufficient documentation

## 2017-01-18 DIAGNOSIS — M5416 Radiculopathy, lumbar region: Secondary | ICD-10-CM

## 2017-01-18 DIAGNOSIS — E785 Hyperlipidemia, unspecified: Secondary | ICD-10-CM | POA: Insufficient documentation

## 2017-01-18 DIAGNOSIS — K6289 Other specified diseases of anus and rectum: Secondary | ICD-10-CM | POA: Diagnosis present

## 2017-01-18 DIAGNOSIS — M545 Low back pain: Secondary | ICD-10-CM | POA: Insufficient documentation

## 2017-01-18 DIAGNOSIS — Z79899 Other long term (current) drug therapy: Secondary | ICD-10-CM

## 2017-01-18 DIAGNOSIS — I129 Hypertensive chronic kidney disease with stage 1 through stage 4 chronic kidney disease, or unspecified chronic kidney disease: Secondary | ICD-10-CM | POA: Diagnosis not present

## 2017-01-18 DIAGNOSIS — M79605 Pain in left leg: Secondary | ICD-10-CM

## 2017-01-18 DIAGNOSIS — R918 Other nonspecific abnormal finding of lung field: Secondary | ICD-10-CM | POA: Diagnosis not present

## 2017-01-18 DIAGNOSIS — E559 Vitamin D deficiency, unspecified: Secondary | ICD-10-CM | POA: Diagnosis not present

## 2017-01-18 DIAGNOSIS — M25561 Pain in right knee: Secondary | ICD-10-CM | POA: Diagnosis not present

## 2017-01-18 DIAGNOSIS — M797 Fibromyalgia: Secondary | ICD-10-CM

## 2017-01-18 DIAGNOSIS — R809 Proteinuria, unspecified: Secondary | ICD-10-CM | POA: Diagnosis not present

## 2017-01-18 DIAGNOSIS — M5441 Lumbago with sciatica, right side: Secondary | ICD-10-CM

## 2017-01-18 DIAGNOSIS — Z6841 Body Mass Index (BMI) 40.0 and over, adult: Secondary | ICD-10-CM | POA: Insufficient documentation

## 2017-01-18 DIAGNOSIS — M1990 Unspecified osteoarthritis, unspecified site: Secondary | ICD-10-CM | POA: Insufficient documentation

## 2017-01-18 DIAGNOSIS — M4316 Spondylolisthesis, lumbar region: Secondary | ICD-10-CM | POA: Diagnosis not present

## 2017-01-18 DIAGNOSIS — M1711 Unilateral primary osteoarthritis, right knee: Secondary | ICD-10-CM | POA: Insufficient documentation

## 2017-01-18 DIAGNOSIS — G4733 Obstructive sleep apnea (adult) (pediatric): Secondary | ICD-10-CM | POA: Insufficient documentation

## 2017-01-18 DIAGNOSIS — K7460 Unspecified cirrhosis of liver: Secondary | ICD-10-CM | POA: Diagnosis not present

## 2017-01-18 DIAGNOSIS — M47896 Other spondylosis, lumbar region: Secondary | ICD-10-CM

## 2017-01-18 DIAGNOSIS — M792 Neuralgia and neuritis, unspecified: Secondary | ICD-10-CM | POA: Diagnosis not present

## 2017-01-18 DIAGNOSIS — Z79891 Long term (current) use of opiate analgesic: Secondary | ICD-10-CM | POA: Insufficient documentation

## 2017-01-18 DIAGNOSIS — E1121 Type 2 diabetes mellitus with diabetic nephropathy: Secondary | ICD-10-CM | POA: Diagnosis not present

## 2017-01-18 DIAGNOSIS — M533 Sacrococcygeal disorders, not elsewhere classified: Secondary | ICD-10-CM | POA: Insufficient documentation

## 2017-01-18 MED ORDER — CYCLOBENZAPRINE HCL 10 MG PO TABS: 10.0000 mg | ORAL_TABLET | Freq: Three times a day (TID) | ORAL | 0 refills | Status: DC | PRN

## 2017-01-18 MED ORDER — GABAPENTIN 600 MG PO TABS: 600.0000 mg | ORAL_TABLET | Freq: Every day | ORAL | 0 refills | Status: DC

## 2017-01-18 MED ORDER — VITAMIN D (ERGOCALCIFEROL) 1.25 MG (50000 UNIT) PO CAPS: ORAL_CAPSULE | ORAL | 0 refills | Status: DC

## 2017-01-18 MED ORDER — TRAMADOL HCL 50 MG PO TABS: 100.0000 mg | ORAL_TABLET | Freq: Three times a day (TID) | ORAL | 2 refills | Status: DC

## 2017-01-18 MED ORDER — VITAMIN D3 50 MCG (2000 UT) PO CAPS: ORAL_CAPSULE | ORAL | 99 refills | Status: DC

## 2017-01-18 NOTE — Progress Notes (Signed)
Safety precautions to be maintained throughout the outpatient stay will include: orient to surroundings, keep bed in low position, maintain call bell within reach at all times, provide assistance with transfer out of bed and ambulation.  

## 2017-01-18 NOTE — Patient Instructions (Signed)
GENERAL RISKS AND COMPLICATIONS  What are the risk, side effects and possible complications? Generally speaking, most procedures are safe.  However, with any procedure there are risks, side effects, and the possibility of complications.  The risks and complications are dependent upon the sites that are lesioned, or the type of nerve block to be performed.  The closer the procedure is to the spine, the more serious the risks are.  Great care is taken when placing the radio frequency needles, block needles or lesioning probes, but sometimes complications can occur. 1. Infection: Any time there is an injection through the skin, there is a risk of infection.  This is why sterile conditions are used for these blocks.  There are four possible types of infection. 1. Localized skin infection. 2. Central Nervous System Infection-This can be in the form of Meningitis, which can be deadly. 3. Epidural Infections-This can be in the form of an epidural abscess, which can cause pressure inside of the spine, causing compression of the spinal cord with subsequent paralysis. This would require an emergency surgery to decompress, and there are no guarantees that the patient would recover from the paralysis. 4. Discitis-This is an infection of the intervertebral discs.  It occurs in about 1% of discography procedures.  It is difficult to treat and it may lead to surgery.        2. Pain: the needles have to go through skin and soft tissues, will cause soreness.       3. Damage to internal structures:  The nerves to be lesioned may be near blood vessels or    other nerves which can be potentially damaged.       4. Bleeding: Bleeding is more common if the patient is taking blood thinners such as  aspirin, Coumadin, Ticiid, Plavix, etc., or if he/she have some genetic predisposition  such as hemophilia. Bleeding into the spinal canal can cause compression of the spinal  cord with subsequent paralysis.  This would require an  emergency surgery to  decompress and there are no guarantees that the patient would recover from the  paralysis.       5. Pneumothorax:  Puncturing of a lung is a possibility, every time a needle is introduced in  the area of the chest or upper back.  Pneumothorax refers to free air around the  collapsed lung(s), inside of the thoracic cavity (chest cavity).  Another two possible  complications related to a similar event would include: Hemothorax and Chylothorax.   These are variations of the Pneumothorax, where instead of air around the collapsed  lung(s), you may have blood or chyle, respectively.       6. Spinal headaches: They may occur with any procedures in the area of the spine.       7. Persistent CSF (Cerebro-Spinal Fluid) leakage: This is a rare problem, but may occur  with prolonged intrathecal or epidural catheters either due to the formation of a fistulous  track or a dural tear.       8. Nerve damage: By working so close to the spinal cord, there is always a possibility of  nerve damage, which could be as serious as a permanent spinal cord injury with  paralysis.       9. Death:  Although rare, severe deadly allergic reactions known as "Anaphylactic  reaction" can occur to any of the medications used.      10. Worsening of the symptoms:  We can always make thing worse.    What are the chances of something like this happening? Chances of any of this occuring are extremely low.  By statistics, you have more of a chance of getting killed in a motor vehicle accident: while driving to the hospital than any of the above occurring .  Nevertheless, you should be aware that they are possibilities.  In general, it is similar to taking a shower.  Everybody knows that you can slip, hit your head and get killed.  Does that mean that you should not shower again?  Nevertheless always keep in mind that statistics do not mean anything if you happen to be on the wrong side of them.  Even if a procedure has a 1  (one) in a 1,000,000 (million) chance of going wrong, it you happen to be that one..Also, keep in mind that by statistics, you have more of a chance of having something go wrong when taking medications.  Who should not have this procedure? If you are on a blood thinning medication (e.g. Coumadin, Plavix, see list of "Blood Thinners"), or if you have an active infection going on, you should not have the procedure.  If you are taking any blood thinners, please inform your physician.  How should I prepare for this procedure?  Do not eat or drink anything at least six hours prior to the procedure.  Bring a driver with you .  It cannot be a taxi.  Come accompanied by an adult that can drive you back, and that is strong enough to help you if your legs get weak or numb from the local anesthetic.  Take all of your medicines the morning of the procedure with just enough water to swallow them.  If you have diabetes, make sure that you are scheduled to have your procedure done first thing in the morning, whenever possible.  If you have diabetes, take only half of your insulin dose and notify our nurse that you have done so as soon as you arrive at the clinic.  If you are diabetic, but only take blood sugar pills (oral hypoglycemic), then do not take them on the morning of your procedure.  You may take them after you have had the procedure.  Do not take aspirin or any aspirin-containing medications, at least eleven (11) days prior to the procedure.  They may prolong bleeding.  Wear loose fitting clothing that may be easy to take off and that you would not mind if it got stained with Betadine or blood.  Do not wear any jewelry or perfume  Remove any nail coloring.  It will interfere with some of our monitoring equipment.  NOTE: Remember that this is not meant to be interpreted as a complete list of all possible complications.  Unforeseen problems may occur.  BLOOD THINNERS The following drugs  contain aspirin or other products, which can cause increased bleeding during surgery and should not be taken for 2 weeks prior to and 1 week after surgery.  If you should need take something for relief of minor pain, you may take acetaminophen which is found in Tylenol,m Datril, Anacin-3 and Panadol. It is not blood thinner. The products listed below are.  Do not take any of the products listed below in addition to any listed on your instruction sheet.  A.P.C or A.P.C with Codeine Codeine Phosphate Capsules #3 Ibuprofen Ridaura  ABC compound Congesprin Imuran rimadil  Advil Cope Indocin Robaxisal  Alka-Seltzer Effervescent Pain Reliever and Antacid Coricidin or Coricidin-D  Indomethacin Rufen    Alka-Seltzer plus Cold Medicine Cosprin Ketoprofen S-A-C Tablets  Anacin Analgesic Tablets or Capsules Coumadin Korlgesic Salflex  Anacin Extra Strength Analgesic tablets or capsules CP-2 Tablets Lanoril Salicylate  Anaprox Cuprimine Capsules Levenox Salocol  Anexsia-D Dalteparin Magan Salsalate  Anodynos Darvon compound Magnesium Salicylate Sine-off  Ansaid Dasin Capsules Magsal Sodium Salicylate  Anturane Depen Capsules Marnal Soma  APF Arthritis pain formula Dewitt's Pills Measurin Stanback  Argesic Dia-Gesic Meclofenamic Sulfinpyrazone  Arthritis Bayer Timed Release Aspirin Diclofenac Meclomen Sulindac  Arthritis pain formula Anacin Dicumarol Medipren Supac  Analgesic (Safety coated) Arthralgen Diffunasal Mefanamic Suprofen  Arthritis Strength Bufferin Dihydrocodeine Mepro Compound Suprol  Arthropan liquid Dopirydamole Methcarbomol with Aspirin Synalgos  ASA tablets/Enseals Disalcid Micrainin Tagament  Ascriptin Doan's Midol Talwin  Ascriptin A/D Dolene Mobidin Tanderil  Ascriptin Extra Strength Dolobid Moblgesic Ticlid  Ascriptin with Codeine Doloprin or Doloprin with Codeine Momentum Tolectin  Asperbuf Duoprin Mono-gesic Trendar  Aspergum Duradyne Motrin or Motrin IB Triminicin  Aspirin  plain, buffered or enteric coated Durasal Myochrisine Trigesic  Aspirin Suppositories Easprin Nalfon Trillsate  Aspirin with Codeine Ecotrin Regular or Extra Strength Naprosyn Uracel  Atromid-S Efficin Naproxen Ursinus  Auranofin Capsules Elmiron Neocylate Vanquish  Axotal Emagrin Norgesic Verin  Azathioprine Empirin or Empirin with Codeine Normiflo Vitamin E  Azolid Emprazil Nuprin Voltaren  Bayer Aspirin plain, buffered or children's or timed BC Tablets or powders Encaprin Orgaran Warfarin Sodium  Buff-a-Comp Enoxaparin Orudis Zorpin  Buff-a-Comp with Codeine Equegesic Os-Cal-Gesic   Buffaprin Excedrin plain, buffered or Extra Strength Oxalid   Bufferin Arthritis Strength Feldene Oxphenbutazone   Bufferin plain or Extra Strength Feldene Capsules Oxycodone with Aspirin   Bufferin with Codeine Fenoprofen Fenoprofen Pabalate or Pabalate-SF   Buffets II Flogesic Panagesic   Buffinol plain or Extra Strength Florinal or Florinal with Codeine Panwarfarin   Buf-Tabs Flurbiprofen Penicillamine   Butalbital Compound Four-way cold tablets Penicillin   Butazolidin Fragmin Pepto-Bismol   Carbenicillin Geminisyn Percodan   Carna Arthritis Reliever Geopen Persantine   Carprofen Gold's salt Persistin   Chloramphenicol Goody's Phenylbutazone   Chloromycetin Haltrain Piroxlcam   Clmetidine heparin Plaquenil   Cllnoril Hyco-pap Ponstel   Clofibrate Hydroxy chloroquine Propoxyphen         Before stopping any of these medications, be sure to consult the physician who ordered them.  Some, such as Coumadin (Warfarin) are ordered to prevent or treat serious conditions such as "deep thrombosis", "pumonary embolisms", and other heart problems.  The amount of time that you may need off of the medication may also vary with the medication and the reason for which you were taking it.  If you are taking any of these medications, please make sure you notify your pain physician before you undergo any  procedures.         Epidural Steroid Injection An epidural steroid injection is a shot of steroid medicine and numbing medicine that is given into the space between the spinal cord and the bones in your back (epidural space). The shot helps relieve pain caused by an irritated or swollen nerve root. The amount of pain relief you get from the injection depends on what is causing the nerve to be swollen and irritated, and how long your pain lasts. You are more likely to benefit from this injection if your pain is strong and comes on suddenly rather than if you have had pain for a long time. Tell a health care provider about:  Any allergies you have.  All medicines you are taking, including vitamins, herbs,   eye drops, creams, and over-the-counter medicines.  Any problems you or family members have had with anesthetic medicines.  Any blood disorders you have.  Any surgeries you have had.  Any medical conditions you have.  Whether you are pregnant or may be pregnant. What are the risks? Generally, this is a safe procedure. However, problems may occur, including:  Headache.  Bleeding.  Infection.  Allergic reaction to medicines.  Damage to your nerves.  What happens before the procedure? Staying hydrated Follow instructions from your health care provider about hydration, which may include:  Up to 2 hours before the procedure - you may continue to drink clear liquids, such as water, clear fruit juice, black coffee, and plain tea.  Eating and drinking restrictions Follow instructions from your health care provider about eating and drinking, which may include:  8 hours before the procedure - stop eating heavy meals or foods such as meat, fried foods, or fatty foods.  6 hours before the procedure - stop eating light meals or foods, such as toast or cereal.  6 hours before the procedure - stop drinking milk or drinks that contain milk.  2 hours before the procedure - stop  drinking clear liquids.  Medicine  You may be given medicines to lower anxiety.  Ask your health care provider about: ? Changing or stopping your regular medicines. This is especially important if you are taking diabetes medicines or blood thinners. ? Taking medicines such as aspirin and ibuprofen. These medicines can thin your blood. Do not take these medicines before your procedure if your health care provider instructs you not to. General instructions  Plan to have someone take you home from the hospital or clinic. What happens during the procedure?  You may receive a medicine to help you relax (sedative).  You will be asked to lie on your abdomen.  The injection site will be cleaned.  A numbing medicine (local anesthetic) will be used to numb the injection site.  A needle will be inserted through your skin into the epidural space. You may feel some discomfort when this happens. An X-ray machine will be used to make sure the needle is put as close as possible to the affected nerve.  A steroid medicine and a local anesthetic will be injected into the epidural space.  The needle will be removed.  A bandage (dressing) will be put over the injection site. What happens after the procedure?  Your blood pressure, heart rate, breathing rate, and blood oxygen level will be monitored until the medicines you were given have worn off.  Your arm or leg may feel weak or numb for a few hours.  The injection site may feel sore.  Do not drive for 24 hours if you received a sedative. This information is not intended to replace advice given to you by your health care provider. Make sure you discuss any questions you have with your health care provider. Document Released: 03/21/2008 Document Revised: 05/26/2016 Document Reviewed: 03/30/2016 Elsevier Interactive Patient Education  2017 [employer]  

## 2017-01-24 ENCOUNTER — Ambulatory Visit: Payer: Medicare Other | Admitting: Pain Medicine

## 2017-02-09 ENCOUNTER — Other Ambulatory Visit: Payer: Self-pay | Admitting: Neurosurgery

## 2017-02-09 DIAGNOSIS — M545 Low back pain: Secondary | ICD-10-CM

## 2017-02-18 ENCOUNTER — Ambulatory Visit
Admission: RE | Admit: 2017-02-18 | Discharge: 2017-02-18 | Disposition: A | Discharge status: Home / Self Care | Payer: Medicare Other | Source: Ambulatory Visit | Attending: Neurosurgery | Admitting: Neurosurgery

## 2017-02-18 DIAGNOSIS — M48061 Spinal stenosis, lumbar region without neurogenic claudication: Secondary | ICD-10-CM | POA: Diagnosis not present

## 2017-02-18 DIAGNOSIS — M8448XA Pathological fracture, other site, initial encounter for fracture: Secondary | ICD-10-CM | POA: Insufficient documentation

## 2017-02-18 DIAGNOSIS — Z981 Arthrodesis status: Secondary | ICD-10-CM | POA: Insufficient documentation

## 2017-02-18 DIAGNOSIS — K5730 Diverticulosis of large intestine without perforation or abscess without bleeding: Secondary | ICD-10-CM | POA: Diagnosis not present

## 2017-02-18 DIAGNOSIS — I2510 Atherosclerotic heart disease of native coronary artery without angina pectoris: Secondary | ICD-10-CM | POA: Insufficient documentation

## 2017-02-18 DIAGNOSIS — M545 Low back pain: Secondary | ICD-10-CM | POA: Diagnosis not present

## 2017-02-18 DIAGNOSIS — M1288 Other specific arthropathies, not elsewhere classified, other specified site: Secondary | ICD-10-CM | POA: Insufficient documentation

## 2017-02-18 DIAGNOSIS — M47814 Spondylosis without myelopathy or radiculopathy, thoracic region: Secondary | ICD-10-CM | POA: Insufficient documentation

## 2017-02-18 DIAGNOSIS — N200 Calculus of kidney: Secondary | ICD-10-CM | POA: Insufficient documentation

## 2017-02-27 ENCOUNTER — Other Ambulatory Visit: Payer: Self-pay | Admitting: Pain Medicine

## 2017-02-27 DIAGNOSIS — E559 Vitamin D deficiency, unspecified: Secondary | ICD-10-CM

## 2017-03-09 ENCOUNTER — Other Ambulatory Visit: Payer: Self-pay | Admitting: Pain Medicine

## 2017-03-09 DIAGNOSIS — E559 Vitamin D deficiency, unspecified: Secondary | ICD-10-CM

## 2017-03-09 NOTE — Progress Notes (Signed)

## 2017-03-10 DIAGNOSIS — I10 Essential (primary) hypertension: Secondary | ICD-10-CM | POA: Insufficient documentation

## 2017-03-28 NOTE — Progress Notes (Signed)
Results were reviewed and found to be: significantly abnormal  Surgical consultation may be of benefit  Review would suggest interventional pain management techniques may be of benefit. For the low back pain component, the patient may be a candidate for diagnostic bilateral lumbar facet blocks under fluoroscopic guidance and IV sedation with possible radiofrequency ablation, if the diagnostic injections are successful. For the lower extremity, hip, and/or knee pain, the patient may benefit from an L3-4 translaminar epidural steroid injection under fluoroscopic guidance. The patient may also be a candidate for a diagnostic caudal epidural steroid injection + diagnostic epidurogram.

## 2017-05-10 ENCOUNTER — Other Ambulatory Visit: Payer: Self-pay | Admitting: Gastroenterology

## 2017-05-10 DIAGNOSIS — K7460 Unspecified cirrhosis of liver: Secondary | ICD-10-CM

## 2017-05-10 DIAGNOSIS — K581 Irritable bowel syndrome with constipation: Secondary | ICD-10-CM

## 2017-05-10 DIAGNOSIS — D509 Iron deficiency anemia, unspecified: Secondary | ICD-10-CM | POA: Insufficient documentation

## 2017-05-10 DIAGNOSIS — R918 Other nonspecific abnormal finding of lung field: Secondary | ICD-10-CM

## 2017-05-10 NOTE — Progress Notes (Signed)
[facility]  [phone] Fax:(336) [phone]  ID: Brittney [name] OB: 1951-03-30  MR#: [id number]  QTM#:[id number]  Patient Care Team: Rusty Aus, MD as PCP - General (Internal Medicine)  CHIEF COMPLAINT: Iron deficiency anemia.  INTERVAL HISTORY: Patient is a 66 year old female who was last evaluated in clinic nearly 5 years ago was noted to have a declining hemoglobin and iron stores. She is also symptomatic with increasing weakness and fatigue. She otherwise feels well. She has no neurologic complaints. She denies any recent fevers or illnesses. She has a good appetite and denies weight loss. She denies any chest pain or shortness of breath. She has no nausea, vomiting, constipation, or diarrhea. She has no urinary complaints. Patient otherwise feels well and offers no further specific complaints.  REVIEW OF SYSTEMS:   Review of Systems  Constitutional: Positive for malaise/fatigue. Negative for fever and weight loss.  Respiratory: Negative.  Negative for cough, hemoptysis and shortness of breath.   Cardiovascular: Negative.  Negative for chest pain and leg swelling.  Gastrointestinal: Negative.  Negative for abdominal pain, blood in stool and melena.  Genitourinary: Negative.   Musculoskeletal: Negative.   Skin: Negative.  Negative for rash.  Neurological: Positive for weakness.  Psychiatric/Behavioral: Negative.  The patient is not nervous/anxious.     As per HPI. Otherwise, a complete review of systems is negative.  PAST MEDICAL HISTORY: Past Medical History:  Diagnosis Date  . Abdominal pain, left upper quadrant 12/11/2012  . Arthritis   . Asthma   . Cancer ([place])    melanoma  . Collagen vascular disease ([place])   . Diabetes mellitus without complication ([place])   . Diabetic nephropathy associated with secondary diabetes mellitus ([place])   . Flushing 12/11/2012  . Gastroparesis   . Gross hematuria 12/11/2012  . Hepatic cirrhosis ([place])   .  Hyperlipidemia   . Hypertension   . Kidney stone 12/11/2012  . Morbid obesity with BMI of 40.0-44.9, adult ([place])   . Nausea without vomiting 12/11/2012  . Nephrolithiasis   . Nephrolithiasis 04/26/2014  . Nonproliferative retinopathy due to secondary diabetes ([place])   . Peripheral vascular disease ([place])   . Renal colic 62/56/3893  . Sciatica   . Sciatica 12/11/2012  . Skin cancer   . Sleep apnea   . Unilateral small kidney without contralateral hypertrophy     PAST SURGICAL HISTORY: Past Surgical History:  Procedure Laterality Date  . ABDOMINAL HYSTERECTOMY    . BACK SURGERY  04/1999   L-4-5 LAMINECTOMY  . CESAREAN SECTION  1980  . COLONOSCOPY  05/04/2001  . COLONOSCOPY WITH PROPOFOL N/A 09/20/2016   Procedure: COLONOSCOPY WITH PROPOFOL;  Surgeon: Lollie Sails, MD;  Location: [facility] ENDOSCOPY;  Service: Endoscopy;  Laterality: N/A;  . DILATION AND CURETTAGE OF UTERUS    . ESOPHAGOGASTRODUODENOSCOPY  02/08/2014  . ESOPHAGOGASTRODUODENOSCOPY (EGD) WITH PROPOFOL N/A 09/20/2016   Procedure: ESOPHAGOGASTRODUODENOSCOPY (EGD) WITH PROPOFOL;  Surgeon: Lollie Sails, MD;  Location: [facility] ENDOSCOPY;  Service: Endoscopy;  Laterality: N/A;  . EYE SURGERY  1986   FOR MELANOMA  . JOINT REPLACEMENT Left 09/24/2013   TOTAL KNEE  . MOHS SURGERY  11/2016   left side of nose  . TONSILLECTOMY     WITH ADENOIDECTOMY    FAMILY HISTORY: Family History  Problem Relation Age of Onset  . Heart disease Mother   . Cancer Mother   . Asthma Mother   . Diabetes Father   . Kidney disease Father   .  Hypertension Father   . Breast cancer Neg Hx     ADVANCED DIRECTIVES (Y/N):  N  HEALTH MAINTENANCE: Social History  Substance Use Topics  . Smoking status: Never Smoker  . Smokeless tobacco: Never Used  . Alcohol use No     Colonoscopy:  PAP:  Bone density:  Lipid panel:  Allergies  Allergen Reactions  . Amlodipine Cough  . Imdur [Isosorbide Dinitrate] Other (See Comments)     Headache  . Lyrica [Pregabalin] Other (See Comments) and Swelling    "FLU-LIKE" SYMPTOMS  . Monosodium Glutamate Other (See Comments)    Headache  . Shellfish Allergy Nausea And Vomiting  . Tape Other (See Comments)    can use paper tape  . Eggs Or Egg-Derived Products Nausea And Vomiting    Current Outpatient Prescriptions  Medication Sig Dispense Refill  . albuterol (VENTOLIN HFA) 108 (90 Base) MCG/ACT inhaler TAKE 2 PUFFS BY MOUTH 3 TIMES A DAY AS NEEDED    . ALPRAZolam (XANAX) 0.5 MG tablet Take 0.5 mg by mouth 5 (five) times daily.    . bumetanide (BUMEX) 1 MG tablet Take by mouth.    . butalbital-acetaminophen-caffeine (FIORICET, ESGIC) 50-325-40 MG tablet TAKE ONE TABLET BY MOUTH 2 TIMES A DAY AS NEEDED    . carvedilol (COREG) 25 MG tablet Take by mouth.    . cephALEXin (KEFLEX) 500 MG capsule TAKE 1 CAPSULE (500 MG TOTAL) BY MOUTH 4 (FOUR) TIMES DAILY FOR 14 DAYS.  0  . cloNIDine (CATAPRES - DOSED IN MG/24 HR) 0.2 mg/24hr patch 0.3 patches. Frequency:1XW   Dosage:0.0     Instructions:  Note:Dose: 0.2MG /24H    . cyclobenzaprine (FLEXERIL) 10 MG tablet Take 1 tablet (10 mg total) by mouth 3 (three) times daily as needed for muscle spasms. 270 tablet 0  . diclofenac sodium (VOLTAREN) 1 % GEL Apply topically as needed.     . doxazosin (CARDURA) 4 MG tablet Take 4 mg by mouth daily.     Marland Kitchen escitalopram (LEXAPRO) 10 MG tablet Take 10 mg by mouth daily.     . fluticasone (FLONASE) 50 MCG/ACT nasal spray PLACE 2 SPRAYS INTO BOTH NOSTRILS 2 (TWO) TIMES DAILY.    Marland Kitchen gabapentin (NEURONTIN) 600 MG tablet Take 1 tablet (600 mg total) by mouth 6 (six) times daily. (Patient taking differently: Take 600 mg by mouth 5 (five) times daily. ) 540 tablet 0  . hydrALAZINE (APRESOLINE) 25 MG tablet Take 25 mg by mouth daily.     . hydrocortisone 2.5 % cream APPLY TOPICALLY 2 (TWO) TIMES DAILY FOR 10 DAYS.    Marland Kitchen ibuprofen (ADVIL,MOTRIN) 800 MG tablet Take 800 mg by mouth 3 (three) times daily.     .  Insulin Detemir (LEVEMIR FLEXTOUCH) 100 UNIT/ML Pen INJECT 80 UNITS SUBCUTANEOUSLY TWICE A DAY    . Insulin Pen Needle (FIFTY50 PEN NEEDLES) 32G X 6 MM MISC 5 (five) times daily.    . insulin regular human CONCENTRATED (HUMULIN R) 500 UNIT/ML kwikpen Inject 65 Units into the skin 3 (three) times daily with meals.     . lidocaine (LIDODERM) 5 % PLACE 1 PATCH ONTO THE MOST PAINFUL AREA OF SKIN DAILY FOR UP TO 12 HOURS IN A 24 HOUR PERIOD  5  . lisinopril (PRINIVIL,ZESTRIL) 40 MG tablet TAKE 1 TABLET (40 MG TOTAL) BY MOUTH ONCE DAILY.  3  . lubiprostone (AMITIZA) 24 MCG capsule Take by mouth.    . metoCLOPramide (REGLAN) 10 MG tablet Take 10 mg by mouth 3 (  three) times daily.    . mometasone (ELOCON) 0.1 % lotion APPLY TWICE A DAY as needed    . ondansetron (ZOFRAN) 4 MG tablet Take 4 mg by mouth every 8 (eight) hours as needed for nausea.     . pantoprazole (PROTONIX) 40 MG tablet TAKE 1 TABLET (40 MG TOTAL) BY MOUTH 2 (TWO) TIMES DAILY. TAKE ONE HOUR BEFORE A MEAL    . potassium chloride (K-DUR,KLOR-CON) 10 MEQ tablet TAKE 1 TABLET BY MOUTH ONCE A DAY    . pramipexole (MIRAPEX) 0.5 MG tablet TAKE 1 TABLET (0.5 MG TOTAL) BY MOUTH 2 (TWO) TIMES DAILY.    . traMADol (ULTRAM) 50 MG tablet Take 2 tablets (100 mg total) by mouth every 8 (eight) hours. 180 tablet 2  . traZODone (DESYREL) 150 MG tablet Take 150 mg by mouth at bedtime.     . triamcinolone ointment (KENALOG) 0.1 % APPLY TO AFFECTED AREA AT BEDTIME    . zolpidem (AMBIEN) 10 MG tablet Take 10 mg by mouth at bedtime.      No current facility-administered medications for this visit.     OBJECTIVE: Vitals:   05/11/17 1534  BP: (!) 152/77  Pulse: 83  Resp: 18  Temp: 99 F (37.2 C)     Body mass index is 43.58 kg/m.    ECOG FS:0 - Asymptomatic  General: Well-developed, well-nourished, no acute distress. Eyes: Pink conjunctiva, anicteric sclera. HEENT: Normocephalic, moist mucous membranes, clear oropharnyx. Lungs: Clear to  auscultation bilaterally. Heart: Regular rate and rhythm. No rubs, murmurs, or gallops. Abdomen: Soft, nontender, nondistended. No organomegaly noted, normoactive bowel sounds. Musculoskeletal: No edema, cyanosis, or clubbing. Neuro: Alert, answering all questions appropriately. Cranial nerves grossly intact. Skin: No rashes or petechiae noted. Psych: Normal affect. Lymphatics: No cervical, calvicular, axillary or inguinal LAD.   LAB RESULTS:  Lab Results  Component Value Date   NA 139 12/07/2016   K 3.7 12/07/2016   CL 103 12/07/2016   CO2 28 12/07/2016   GLUCOSE 43 (LL) 12/07/2016   BUN 13 12/07/2016   CREATININE 0.66 12/07/2016   CALCIUM 9.9 12/07/2016   PROT 7.6 12/07/2016   ALBUMIN 3.7 12/07/2016   AST 34 12/07/2016   ALT 20 12/07/2016   ALKPHOS 106 12/07/2016   BILITOT 0.3 12/07/2016   GFRNONAA >60 12/07/2016   GFRAA >60 12/07/2016    Lab Results  Component Value Date   WBC 9.5 09/20/2016   NEUTROABS 4.6 12/10/2014   HGB 12.0 09/20/2016   HCT 37.6 09/20/2016   MCV 77.8 (L) 09/20/2016   PLT 249 09/20/2016     STUDIES: No results found.  ASSESSMENT: Iron deficiency anemia.  PLAN:    1. Iron deficiency anemia: Patient's most recent hemoglobin on April 19, 2017 was reported at 8.5. Her MCV was 76 and she had a ferritin of 11. Colonoscopy and EGD completed on September 20, 2016 were essentially normal other than some mild gastritis. She is symptomatic. Proceed with 510 mg IV Feraheme later this week. She will then receive a second dose 1 week later. Return to clinic in 2 months for repeat laboratory work and further evaluation. If patient does not have resolution of her anemia at that time, we will consider a full laboratory workup. 2. Hypertension: Patient's blood pressure is mildly elevated today. Continue evaluation and treatment per primary care.  Approximately 45 minutes was spent in discussion of which greater than 50% was consultation.  Patient expressed  understanding and was in agreement with this plan.  She also understands that She can call clinic at any time with any questions, concerns, or complaints.    Lloyd Huger, MD   05/14/2017 8:02 AM

## 2017-05-11 ENCOUNTER — Inpatient Hospital Stay: Payer: Medicare Other | Attending: Oncology | Admitting: Oncology

## 2017-05-11 ENCOUNTER — Encounter: Payer: Self-pay | Admitting: Oncology

## 2017-05-11 VITALS — BP 152/77 | HR 83 | Temp 99.0°F | Resp 18 | Wt 270.0 lb

## 2017-05-11 DIAGNOSIS — R5381 Other malaise: Secondary | ICD-10-CM | POA: Insufficient documentation

## 2017-05-11 DIAGNOSIS — Z79899 Other long term (current) drug therapy: Secondary | ICD-10-CM | POA: Diagnosis not present

## 2017-05-11 DIAGNOSIS — E1121 Type 2 diabetes mellitus with diabetic nephropathy: Secondary | ICD-10-CM | POA: Insufficient documentation

## 2017-05-11 DIAGNOSIS — R531 Weakness: Secondary | ICD-10-CM | POA: Diagnosis not present

## 2017-05-11 DIAGNOSIS — Z6841 Body Mass Index (BMI) 40.0 and over, adult: Secondary | ICD-10-CM

## 2017-05-11 DIAGNOSIS — R5383 Other fatigue: Secondary | ICD-10-CM | POA: Diagnosis not present

## 2017-05-11 DIAGNOSIS — G4730 Sleep apnea, unspecified: Secondary | ICD-10-CM | POA: Diagnosis not present

## 2017-05-11 DIAGNOSIS — Z794 Long term (current) use of insulin: Secondary | ICD-10-CM | POA: Diagnosis not present

## 2017-05-11 DIAGNOSIS — D509 Iron deficiency anemia, unspecified: Secondary | ICD-10-CM | POA: Insufficient documentation

## 2017-05-11 DIAGNOSIS — I10 Essential (primary) hypertension: Secondary | ICD-10-CM | POA: Insufficient documentation

## 2017-05-11 DIAGNOSIS — E785 Hyperlipidemia, unspecified: Secondary | ICD-10-CM | POA: Insufficient documentation

## 2017-05-12 ENCOUNTER — Other Ambulatory Visit: Payer: Self-pay | Admitting: Oncology

## 2017-05-13 ENCOUNTER — Inpatient Hospital Stay: Payer: Medicare Other

## 2017-05-13 VITALS — BP 143/75 | HR 84 | Resp 20

## 2017-05-13 DIAGNOSIS — D509 Iron deficiency anemia, unspecified: Secondary | ICD-10-CM | POA: Diagnosis not present

## 2017-05-13 MED ORDER — FERUMOXYTOL INJECTION 510 MG/17 ML
510.0000 mg | Freq: Once | INTRAVENOUS | Status: AC
  Administered 2017-05-13: 510 mg via INTRAVENOUS
  Filled 2017-05-13: qty 17

## 2017-05-13 MED ORDER — SODIUM CHLORIDE 0.9 % IV SOLN
Freq: Once | INTRAVENOUS | Status: AC
  Administered 2017-05-13: 15:00:00 via INTRAVENOUS
  Filled 2017-05-13: qty 1000

## 2017-05-19 ENCOUNTER — Ambulatory Visit: Payer: Medicare Other

## 2017-05-19 ENCOUNTER — Other Ambulatory Visit: Payer: Medicare Other

## 2017-05-20 ENCOUNTER — Inpatient Hospital Stay: Payer: Medicare Other

## 2017-05-20 VITALS — BP 144/76 | HR 80 | Temp 98.6°F | Resp 20

## 2017-05-20 DIAGNOSIS — D509 Iron deficiency anemia, unspecified: Secondary | ICD-10-CM | POA: Diagnosis not present

## 2017-05-20 MED ORDER — SODIUM CHLORIDE 0.9 % IV SOLN
Freq: Once | INTRAVENOUS | Status: AC
  Administered 2017-05-20: 14:00:00 via INTRAVENOUS
  Filled 2017-05-20: qty 1000

## 2017-05-20 MED ORDER — SODIUM CHLORIDE 0.9 % IV SOLN
510.0000 mg | Freq: Once | INTRAVENOUS | Status: AC
  Administered 2017-05-20: 510 mg via INTRAVENOUS
  Filled 2017-05-20: qty 17

## 2017-05-25 ENCOUNTER — Ambulatory Visit
Admission: RE | Admit: 2017-05-25 | Discharge: 2017-05-25 | Disposition: A | Discharge status: Home / Self Care | Payer: Medicare Other | Source: Ambulatory Visit | Attending: Gastroenterology | Admitting: Gastroenterology

## 2017-05-25 DIAGNOSIS — N200 Calculus of kidney: Secondary | ICD-10-CM | POA: Insufficient documentation

## 2017-05-25 DIAGNOSIS — I700 Atherosclerosis of aorta: Secondary | ICD-10-CM | POA: Insufficient documentation

## 2017-05-25 DIAGNOSIS — I7789 Other specified disorders of arteries and arterioles: Secondary | ICD-10-CM | POA: Diagnosis not present

## 2017-05-25 DIAGNOSIS — R161 Splenomegaly, not elsewhere classified: Secondary | ICD-10-CM | POA: Insufficient documentation

## 2017-05-25 DIAGNOSIS — K581 Irritable bowel syndrome with constipation: Secondary | ICD-10-CM | POA: Diagnosis not present

## 2017-05-25 DIAGNOSIS — R918 Other nonspecific abnormal finding of lung field: Secondary | ICD-10-CM | POA: Diagnosis not present

## 2017-05-25 DIAGNOSIS — K7460 Unspecified cirrhosis of liver: Secondary | ICD-10-CM | POA: Diagnosis not present

## 2017-05-25 MED ORDER — IOPAMIDOL (ISOVUE-300) INJECTION 61%
100.0000 mL | Freq: Once | INTRAVENOUS | Status: AC | PRN
  Administered 2017-05-25: 100 mL via INTRAVENOUS

## 2017-05-30 ENCOUNTER — Other Ambulatory Visit: Payer: Self-pay | Admitting: Internal Medicine

## 2017-05-30 DIAGNOSIS — Z1231 Encounter for screening mammogram for malignant neoplasm of breast: Secondary | ICD-10-CM

## 2017-06-22 ENCOUNTER — Ambulatory Visit
Admission: RE | Admit: 2017-06-22 | Discharge: 2017-06-22 | Disposition: A | Discharge status: Home / Self Care | Payer: Medicare Other | Source: Ambulatory Visit | Attending: Internal Medicine | Admitting: Internal Medicine

## 2017-06-22 ENCOUNTER — Encounter: Payer: Self-pay | Admitting: Internal Medicine

## 2017-06-22 DIAGNOSIS — Z1231 Encounter for screening mammogram for malignant neoplasm of breast: Secondary | ICD-10-CM | POA: Diagnosis not present

## 2017-07-13 ENCOUNTER — Inpatient Hospital Stay: Payer: Medicare Other | Attending: Oncology

## 2017-07-13 ENCOUNTER — Inpatient Hospital Stay: Payer: Medicare Other

## 2017-07-13 ENCOUNTER — Inpatient Hospital Stay (HOSPITAL_BASED_OUTPATIENT_CLINIC_OR_DEPARTMENT_OTHER): Payer: Medicare Other | Admitting: Oncology

## 2017-07-13 VITALS — BP 133/77 | HR 77 | Temp 98.9°F | Resp 18 | Wt 275.8 lb

## 2017-07-13 DIAGNOSIS — E1121 Type 2 diabetes mellitus with diabetic nephropathy: Secondary | ICD-10-CM | POA: Insufficient documentation

## 2017-07-13 DIAGNOSIS — I10 Essential (primary) hypertension: Secondary | ICD-10-CM

## 2017-07-13 DIAGNOSIS — D509 Iron deficiency anemia, unspecified: Secondary | ICD-10-CM

## 2017-07-13 DIAGNOSIS — K7460 Unspecified cirrhosis of liver: Secondary | ICD-10-CM | POA: Insufficient documentation

## 2017-07-13 DIAGNOSIS — Z6841 Body Mass Index (BMI) 40.0 and over, adult: Secondary | ICD-10-CM | POA: Insufficient documentation

## 2017-07-13 DIAGNOSIS — Z79899 Other long term (current) drug therapy: Secondary | ICD-10-CM

## 2017-07-13 DIAGNOSIS — R5383 Other fatigue: Secondary | ICD-10-CM | POA: Diagnosis not present

## 2017-07-13 DIAGNOSIS — E1151 Type 2 diabetes mellitus with diabetic peripheral angiopathy without gangrene: Secondary | ICD-10-CM

## 2017-07-13 DIAGNOSIS — E785 Hyperlipidemia, unspecified: Secondary | ICD-10-CM | POA: Diagnosis not present

## 2017-07-13 DIAGNOSIS — E113299 Type 2 diabetes mellitus with mild nonproliferative diabetic retinopathy without macular edema, unspecified eye: Secondary | ICD-10-CM | POA: Insufficient documentation

## 2017-07-13 DIAGNOSIS — R5381 Other malaise: Secondary | ICD-10-CM | POA: Insufficient documentation

## 2017-07-13 DIAGNOSIS — R531 Weakness: Secondary | ICD-10-CM | POA: Diagnosis not present

## 2017-07-13 DIAGNOSIS — Z85820 Personal history of malignant melanoma of skin: Secondary | ICD-10-CM

## 2017-07-13 DIAGNOSIS — Z794 Long term (current) use of insulin: Secondary | ICD-10-CM | POA: Diagnosis not present

## 2017-07-13 DIAGNOSIS — Z809 Family history of malignant neoplasm, unspecified: Secondary | ICD-10-CM | POA: Insufficient documentation

## 2017-07-13 LAB — FERRITIN: Ferritin: 64 ng/mL (ref 11–307)

## 2017-07-13 LAB — CBC WITH DIFFERENTIAL/PLATELET
BASOS PCT: 1 %
Basophils Absolute: 0.1 10*3/uL (ref 0–0.1)
EOS ABS: 0.5 10*3/uL (ref 0–0.7)
EOS PCT: 5 %
HCT: 36 % (ref 35.0–47.0)
HEMOGLOBIN: 12 g/dL (ref 12.0–16.0)
Lymphocytes Relative: 28 %
Lymphs Abs: 2.4 10*3/uL (ref 1.0–3.6)
MCH: 26.4 pg (ref 26.0–34.0)
MCHC: 33.4 g/dL (ref 32.0–36.0)
MCV: 79 fL — ABNORMAL LOW (ref 80.0–100.0)
MONOS PCT: 6 %
Monocytes Absolute: 0.5 10*3/uL (ref 0.2–0.9)
NEUTROS PCT: 60 %
Neutro Abs: 5 10*3/uL (ref 1.4–6.5)
PLATELETS: 202 10*3/uL (ref 150–440)
RBC: 4.55 MIL/uL (ref 3.80–5.20)
RDW: 23.2 % — ABNORMAL HIGH (ref 11.5–14.5)
WBC: 8.4 10*3/uL (ref 3.6–11.0)

## 2017-07-13 LAB — IRON AND TIBC
IRON: 40 ug/dL (ref 28–170)
SATURATION RATIOS: 15 % (ref 10.4–31.8)
TIBC: 273 ug/dL (ref 250–450)
UIBC: 233 ug/dL

## 2017-07-13 MED ORDER — FERUMOXYTOL INJECTION 510 MG/17 ML
510.0000 mg | Freq: Once | INTRAVENOUS | Status: AC
  Administered 2017-07-13: 510 mg via INTRAVENOUS
  Filled 2017-07-13: qty 17

## 2017-07-13 MED ORDER — SODIUM CHLORIDE 0.9 % IV SOLN
INTRAVENOUS | Status: DC
  Administered 2017-07-13: 15:00:00 via INTRAVENOUS
  Filled 2017-07-13: qty 1000

## 2017-07-13 NOTE — Progress Notes (Signed)
[facility]  Telephone:(336) [phone] Fax:(336) [phone]  ID: Brittney [name] OB: 1951-09-19  MR#: [id number]  PZW#:[id number]  Patient Care Team: Rusty Aus, MD as PCP - General (Internal Medicine)  CHIEF COMPLAINT: Iron deficiency anemia.  INTERVAL HISTORY: Patient returns to clinic today for repeat laboratory work and further evaluation. She continues to have chronic weakness and fatigue, but otherwise feels well. She has no neurologic complaints. She denies any recent fevers or illnesses. She has a good appetite and denies weight loss. She denies any chest pain or shortness of breath. She has no nausea, vomiting, constipation, or diarrhea. She denies any melena or hematochezia. She has no urinary complaints. Patient otherwise feels well and offers no further specific complaints.  REVIEW OF SYSTEMS:   Review of Systems  Constitutional: Positive for malaise/fatigue. Negative for fever and weight loss.  Respiratory: Negative.  Negative for cough, hemoptysis and shortness of breath.   Cardiovascular: Negative.  Negative for chest pain and leg swelling.  Gastrointestinal: Negative.  Negative for abdominal pain, blood in stool and melena.  Genitourinary: Negative.   Musculoskeletal: Negative.   Skin: Negative.  Negative for rash.  Neurological: Positive for weakness.  Psychiatric/Behavioral: Negative.  The patient is not nervous/anxious.     As per HPI. Otherwise, a complete review of systems is negative.  PAST MEDICAL HISTORY: Past Medical History:  Diagnosis Date  . Abdominal pain, left upper quadrant 12/11/2012  . Arthritis   . Asthma   . Cancer ([place])    melanoma  . Collagen vascular disease ([place])   . Diabetes mellitus without complication ([place])   . Diabetic nephropathy associated with secondary diabetes mellitus ([place])   . Flushing 12/11/2012  . Gastroparesis   . Gross hematuria 12/11/2012  . Hepatic cirrhosis ([place])   . Hyperlipidemia   .  Hypertension   . Kidney stone 12/11/2012  . Morbid obesity with BMI of 40.0-44.9, adult ([place])   . Nausea without vomiting 12/11/2012  . Nephrolithiasis   . Nephrolithiasis 04/26/2014  . Nonproliferative retinopathy due to secondary diabetes ([place])   . Peripheral vascular disease ([place])   . Renal colic 42/35/3614  . Sciatica   . Sciatica 12/11/2012  . Skin cancer   . Sleep apnea   . Unilateral small kidney without contralateral hypertrophy     PAST SURGICAL HISTORY: Past Surgical History:  Procedure Laterality Date  . ABDOMINAL HYSTERECTOMY    . BACK SURGERY  04/1999   L-4-5 LAMINECTOMY  . CESAREAN SECTION  1980  . COLONOSCOPY  05/04/2001  . COLONOSCOPY WITH PROPOFOL N/A 09/20/2016   Procedure: COLONOSCOPY WITH PROPOFOL;  Surgeon: Lollie Sails, MD;  Location: [facility] ENDOSCOPY;  Service: Endoscopy;  Laterality: N/A;  . DILATION AND CURETTAGE OF UTERUS    . ESOPHAGOGASTRODUODENOSCOPY  02/08/2014  . ESOPHAGOGASTRODUODENOSCOPY (EGD) WITH PROPOFOL N/A 09/20/2016   Procedure: ESOPHAGOGASTRODUODENOSCOPY (EGD) WITH PROPOFOL;  Surgeon: Lollie Sails, MD;  Location: [facility] ENDOSCOPY;  Service: Endoscopy;  Laterality: N/A;  . EYE SURGERY  1986   FOR MELANOMA  . JOINT REPLACEMENT Left 09/24/2013   TOTAL KNEE  . MOHS SURGERY  11/2016   left side of nose  . TONSILLECTOMY     WITH ADENOIDECTOMY    FAMILY HISTORY: Family History  Problem Relation Age of Onset  . Heart disease Mother   . Cancer Mother   . Asthma Mother   . Diabetes Father   . Kidney disease Father   . Hypertension Father   . Breast cancer Neg  Hx     ADVANCED DIRECTIVES (Y/N):  N  HEALTH MAINTENANCE: Social History  Substance Use Topics  . Smoking status: Never Smoker  . Smokeless tobacco: Never Used  . Alcohol use No     Colonoscopy:  PAP:  Bone density:  Lipid panel:  Allergies  Allergen Reactions  . Amlodipine Cough  . Imdur [Isosorbide Dinitrate] Other (See Comments)    Headache  . Lyrica  [Pregabalin] Other (See Comments) and Swelling    "FLU-LIKE" SYMPTOMS  . Monosodium Glutamate Other (See Comments)    Headache  . Shellfish Allergy Nausea And Vomiting  . Tape Other (See Comments)    can use paper tape  . Eggs Or Egg-Derived Products Nausea And Vomiting    Current Outpatient Prescriptions  Medication Sig Dispense Refill  . albuterol (VENTOLIN HFA) 108 (90 Base) MCG/ACT inhaler TAKE 2 PUFFS BY MOUTH 3 TIMES A DAY AS NEEDED    . ALPRAZolam (XANAX) 0.5 MG tablet Take 0.5 mg by mouth 5 (five) times daily.    . bumetanide (BUMEX) 1 MG tablet Take by mouth.    . butalbital-acetaminophen-caffeine (FIORICET, ESGIC) 50-325-40 MG tablet TAKE ONE TABLET BY MOUTH 2 TIMES A DAY AS NEEDED    . carvedilol (COREG) 25 MG tablet Take by mouth.    . cephALEXin (KEFLEX) 500 MG capsule TAKE 1 CAPSULE (500 MG TOTAL) BY MOUTH 4 (FOUR) TIMES DAILY FOR 14 DAYS.  0  . cloNIDine (CATAPRES - DOSED IN MG/24 HR) 0.2 mg/24hr patch 0.3 patches. Frequency:1XW   Dosage:0.0     Instructions:  Note:Dose: 0.2MG /24H    . diclofenac sodium (VOLTAREN) 1 % GEL Apply topically as needed.     . doxazosin (CARDURA) 4 MG tablet Take 4 mg by mouth daily.     Marland Kitchen escitalopram (LEXAPRO) 10 MG tablet Take 10 mg by mouth daily.     . fluticasone (FLONASE) 50 MCG/ACT nasal spray PLACE 2 SPRAYS INTO BOTH NOSTRILS 2 (TWO) TIMES DAILY.    . hydrALAZINE (APRESOLINE) 25 MG tablet Take 25 mg by mouth daily.     . hydrocortisone 2.5 % cream APPLY TOPICALLY 2 (TWO) TIMES DAILY FOR 10 DAYS.    Marland Kitchen ibuprofen (ADVIL,MOTRIN) 800 MG tablet Take 800 mg by mouth 3 (three) times daily.     . Insulin Detemir (LEVEMIR FLEXTOUCH) 100 UNIT/ML Pen INJECT 80 UNITS SUBCUTANEOUSLY TWICE A DAY    . Insulin Pen Needle (FIFTY50 PEN NEEDLES) 32G X 6 MM MISC 5 (five) times daily.    . insulin regular human CONCENTRATED (HUMULIN R) 500 UNIT/ML kwikpen Inject 65 Units into the skin 3 (three) times daily with meals.     . lidocaine (LIDODERM) 5 % PLACE 1  PATCH ONTO THE MOST PAINFUL AREA OF SKIN DAILY FOR UP TO 12 HOURS IN A 24 HOUR PERIOD  5  . lisinopril (PRINIVIL,ZESTRIL) 40 MG tablet TAKE 1 TABLET (40 MG TOTAL) BY MOUTH ONCE DAILY.  3  . metoCLOPramide (REGLAN) 10 MG tablet Take 10 mg by mouth 3 (three) times daily.    . mometasone (ELOCON) 0.1 % lotion APPLY TWICE A DAY as needed    . ondansetron (ZOFRAN) 4 MG tablet Take 4 mg by mouth every 8 (eight) hours as needed for nausea.     . pantoprazole (PROTONIX) 40 MG tablet TAKE 1 TABLET (40 MG TOTAL) BY MOUTH 2 (TWO) TIMES DAILY. TAKE ONE HOUR BEFORE A MEAL    . potassium chloride (K-DUR,KLOR-CON) 10 MEQ tablet TAKE 1 TABLET BY  MOUTH ONCE A DAY    . pramipexole (MIRAPEX) 0.5 MG tablet TAKE 1 TABLET (0.5 MG TOTAL) BY MOUTH 2 (TWO) TIMES DAILY.    . traZODone (DESYREL) 150 MG tablet Take 150 mg by mouth at bedtime.     . triamcinolone ointment (KENALOG) 0.1 % APPLY TO AFFECTED AREA AT BEDTIME    . zolpidem (AMBIEN) 10 MG tablet Take 10 mg by mouth at bedtime.     . cyclobenzaprine (FLEXERIL) 10 MG tablet Take 1 tablet (10 mg total) by mouth 3 (three) times daily as needed for muscle spasms. 270 tablet 0  . gabapentin (NEURONTIN) 600 MG tablet Take 1 tablet (600 mg total) by mouth 6 (six) times daily. (Patient taking differently: Take 600 mg by mouth 5 (five) times daily. ) 540 tablet 0  . traMADol (ULTRAM) 50 MG tablet Take 2 tablets (100 mg total) by mouth every 8 (eight) hours. 180 tablet 2   No current facility-administered medications for this visit.     OBJECTIVE: Vitals:   07/13/17 1349  BP: 133/77  Pulse: 77  Resp: 18  Temp: 98.9 F (37.2 C)     Body mass index is 44.52 kg/m.    ECOG FS:0 - Asymptomatic  General: Well-developed, well-nourished, no acute distress. Eyes: Pink conjunctiva, anicteric sclera. Lungs: Clear to auscultation bilaterally. Heart: Regular rate and rhythm. No rubs, murmurs, or gallops. Abdomen: Soft, nontender, nondistended. No organomegaly noted,  normoactive bowel sounds. Musculoskeletal: No edema, cyanosis, or clubbing. Neuro: Alert, answering all questions appropriately. Cranial nerves grossly intact. Skin: No rashes or petechiae noted. Psych: Normal affect.   LAB RESULTS:  Lab Results  Component Value Date   NA 139 12/07/2016   K 3.7 12/07/2016   CL 103 12/07/2016   CO2 28 12/07/2016   GLUCOSE 43 (LL) 12/07/2016   BUN 13 12/07/2016   CREATININE 0.66 12/07/2016   CALCIUM 9.9 12/07/2016   PROT 7.6 12/07/2016   ALBUMIN 3.7 12/07/2016   AST 34 12/07/2016   ALT 20 12/07/2016   ALKPHOS 106 12/07/2016   BILITOT 0.3 12/07/2016   GFRNONAA >60 12/07/2016   GFRAA >60 12/07/2016    Lab Results  Component Value Date   WBC 8.4 07/13/2017   NEUTROABS 5.0 07/13/2017   HGB 12.0 07/13/2017   HCT 36.0 07/13/2017   MCV 79.0 (L) 07/13/2017   PLT 202 07/13/2017   Lab Results  Component Value Date   IRON 40 07/13/2017   TIBC 273 07/13/2017   IRONPCTSAT 15 07/13/2017   Lab Results  Component Value Date   FERRITIN 64 07/13/2017     STUDIES: Mm Screening Breast Tomo Bilateral  Result Date: 06/23/2017 CLINICAL DATA:  Screening. EXAM: 2D DIGITAL SCREENING BILATERAL MAMMOGRAM WITH CAD AND ADJUNCT TOMO COMPARISON:  Previous exam(s). ACR Breast Density Category b: There are scattered areas of fibroglandular density. FINDINGS: There are no findings suspicious for malignancy. Images were processed with CAD. IMPRESSION: No mammographic evidence of malignancy. A result letter of this screening mammogram will be mailed directly to the patient. RECOMMENDATION: Screening mammogram in one year. (Code:SM-B-01Y) BI-RADS CATEGORY  1: Negative. Electronically Signed   By: Ammie Ferrier M.D.   On: 06/23/2017 09:21    ASSESSMENT: Iron deficiency anemia.  PLAN:    1. Iron deficiency anemia: Although patient's hemoglobin and iron stores are within normal limits, she continues to be mildly symptomatic therefore will proceed with one  infusion of 510 mg IV Feraheme today. Colonoscopy and EGD completed on September 20, 2016 were  essentially normal other than some mild gastritis. Return to clinic in 3 months for repeat laboratory work and further evaluation.  2. Hypertension: Patient's blood pressure is within normal limits today. Continue evaluation and treatment per primary care.  Approximately 30 minutes was spent in discussion of which greater than 50% was consultation.  Patient expressed understanding and was in agreement with this plan. She also understands that She can call clinic at any time with any questions, concerns, or complaints.    Lloyd Huger, MD   07/17/2017 1:37 PM

## 2017-07-13 NOTE — Progress Notes (Signed)
Patient is here for follow up, she is doing well no complaints.  

## 2017-08-11 ENCOUNTER — Ambulatory Visit
Payer: Medicare Other | Attending: Student in an Organized Health Care Education/Training Program | Admitting: Student in an Organized Health Care Education/Training Program

## 2017-08-11 ENCOUNTER — Encounter: Payer: Self-pay | Admitting: Student in an Organized Health Care Education/Training Program

## 2017-08-11 VITALS — BP 140/56 | HR 36 | Temp 98.7°F | Resp 16 | Ht 66.0 in | Wt 273.0 lb

## 2017-08-11 DIAGNOSIS — K7460 Unspecified cirrhosis of liver: Secondary | ICD-10-CM | POA: Diagnosis not present

## 2017-08-11 DIAGNOSIS — E1143 Type 2 diabetes mellitus with diabetic autonomic (poly)neuropathy: Secondary | ICD-10-CM | POA: Insufficient documentation

## 2017-08-11 DIAGNOSIS — M25562 Pain in left knee: Secondary | ICD-10-CM | POA: Insufficient documentation

## 2017-08-11 DIAGNOSIS — M4316 Spondylolisthesis, lumbar region: Secondary | ICD-10-CM | POA: Insufficient documentation

## 2017-08-11 DIAGNOSIS — M79605 Pain in left leg: Secondary | ICD-10-CM

## 2017-08-11 DIAGNOSIS — E1122 Type 2 diabetes mellitus with diabetic chronic kidney disease: Secondary | ICD-10-CM | POA: Insufficient documentation

## 2017-08-11 DIAGNOSIS — M792 Neuralgia and neuritis, unspecified: Secondary | ICD-10-CM

## 2017-08-11 DIAGNOSIS — F329 Major depressive disorder, single episode, unspecified: Secondary | ICD-10-CM | POA: Insufficient documentation

## 2017-08-11 DIAGNOSIS — K589 Irritable bowel syndrome without diarrhea: Secondary | ICD-10-CM | POA: Insufficient documentation

## 2017-08-11 DIAGNOSIS — M25551 Pain in right hip: Secondary | ICD-10-CM | POA: Insufficient documentation

## 2017-08-11 DIAGNOSIS — M25461 Effusion, right knee: Secondary | ICD-10-CM | POA: Insufficient documentation

## 2017-08-11 DIAGNOSIS — E785 Hyperlipidemia, unspecified: Secondary | ICD-10-CM | POA: Diagnosis not present

## 2017-08-11 DIAGNOSIS — E1165 Type 2 diabetes mellitus with hyperglycemia: Secondary | ICD-10-CM | POA: Insufficient documentation

## 2017-08-11 DIAGNOSIS — E11319 Type 2 diabetes mellitus with unspecified diabetic retinopathy without macular edema: Secondary | ICD-10-CM | POA: Diagnosis not present

## 2017-08-11 DIAGNOSIS — M797 Fibromyalgia: Secondary | ICD-10-CM | POA: Insufficient documentation

## 2017-08-11 DIAGNOSIS — M545 Low back pain: Secondary | ICD-10-CM | POA: Insufficient documentation

## 2017-08-11 DIAGNOSIS — M25462 Effusion, left knee: Secondary | ICD-10-CM | POA: Insufficient documentation

## 2017-08-11 DIAGNOSIS — I129 Hypertensive chronic kidney disease with stage 1 through stage 4 chronic kidney disease, or unspecified chronic kidney disease: Secondary | ICD-10-CM | POA: Insufficient documentation

## 2017-08-11 DIAGNOSIS — E1151 Type 2 diabetes mellitus with diabetic peripheral angiopathy without gangrene: Secondary | ICD-10-CM | POA: Diagnosis not present

## 2017-08-11 DIAGNOSIS — K219 Gastro-esophageal reflux disease without esophagitis: Secondary | ICD-10-CM | POA: Insufficient documentation

## 2017-08-11 DIAGNOSIS — Z87442 Personal history of urinary calculi: Secondary | ICD-10-CM | POA: Insufficient documentation

## 2017-08-11 DIAGNOSIS — M25561 Pain in right knee: Secondary | ICD-10-CM | POA: Diagnosis not present

## 2017-08-11 DIAGNOSIS — D509 Iron deficiency anemia, unspecified: Secondary | ICD-10-CM | POA: Diagnosis not present

## 2017-08-11 DIAGNOSIS — M48061 Spinal stenosis, lumbar region without neurogenic claudication: Secondary | ICD-10-CM | POA: Diagnosis not present

## 2017-08-11 DIAGNOSIS — G8929 Other chronic pain: Secondary | ICD-10-CM | POA: Diagnosis not present

## 2017-08-11 DIAGNOSIS — G510 Bell's palsy: Secondary | ICD-10-CM | POA: Insufficient documentation

## 2017-08-11 DIAGNOSIS — R200 Anesthesia of skin: Secondary | ICD-10-CM | POA: Insufficient documentation

## 2017-08-11 DIAGNOSIS — E559 Vitamin D deficiency, unspecified: Secondary | ICD-10-CM | POA: Diagnosis not present

## 2017-08-11 DIAGNOSIS — G894 Chronic pain syndrome: Secondary | ICD-10-CM | POA: Diagnosis not present

## 2017-08-11 DIAGNOSIS — Z6841 Body Mass Index (BMI) 40.0 and over, adult: Secondary | ICD-10-CM | POA: Insufficient documentation

## 2017-08-11 DIAGNOSIS — M79604 Pain in right leg: Secondary | ICD-10-CM

## 2017-08-11 DIAGNOSIS — F1390 Sedative, hypnotic, or anxiolytic use, unspecified, uncomplicated: Secondary | ICD-10-CM | POA: Insufficient documentation

## 2017-08-11 DIAGNOSIS — M961 Postlaminectomy syndrome, not elsewhere classified: Secondary | ICD-10-CM | POA: Diagnosis not present

## 2017-08-11 DIAGNOSIS — M5416 Radiculopathy, lumbar region: Secondary | ICD-10-CM | POA: Diagnosis not present

## 2017-08-11 DIAGNOSIS — N182 Chronic kidney disease, stage 2 (mild): Secondary | ICD-10-CM | POA: Diagnosis not present

## 2017-08-11 DIAGNOSIS — R918 Other nonspecific abnormal finding of lung field: Secondary | ICD-10-CM | POA: Insufficient documentation

## 2017-08-11 DIAGNOSIS — Z9889 Other specified postprocedural states: Secondary | ICD-10-CM | POA: Diagnosis not present

## 2017-08-11 DIAGNOSIS — E1121 Type 2 diabetes mellitus with diabetic nephropathy: Secondary | ICD-10-CM | POA: Diagnosis not present

## 2017-08-11 DIAGNOSIS — Z85828 Personal history of other malignant neoplasm of skin: Secondary | ICD-10-CM | POA: Insufficient documentation

## 2017-08-11 DIAGNOSIS — J45909 Unspecified asthma, uncomplicated: Secondary | ICD-10-CM | POA: Insufficient documentation

## 2017-08-11 DIAGNOSIS — M159 Polyosteoarthritis, unspecified: Secondary | ICD-10-CM | POA: Insufficient documentation

## 2017-08-11 DIAGNOSIS — I252 Old myocardial infarction: Secondary | ICD-10-CM | POA: Insufficient documentation

## 2017-08-11 DIAGNOSIS — M4804 Spinal stenosis, thoracic region: Secondary | ICD-10-CM

## 2017-08-11 DIAGNOSIS — K3184 Gastroparesis: Secondary | ICD-10-CM | POA: Insufficient documentation

## 2017-08-11 DIAGNOSIS — G4733 Obstructive sleep apnea (adult) (pediatric): Secondary | ICD-10-CM | POA: Diagnosis not present

## 2017-08-11 DIAGNOSIS — N270 Small kidney, unilateral: Secondary | ICD-10-CM | POA: Insufficient documentation

## 2017-08-11 DIAGNOSIS — R978 Other abnormal tumor markers: Secondary | ICD-10-CM | POA: Insufficient documentation

## 2017-08-11 DIAGNOSIS — R531 Weakness: Secondary | ICD-10-CM | POA: Insufficient documentation

## 2017-08-11 DIAGNOSIS — H02401 Unspecified ptosis of right eyelid: Secondary | ICD-10-CM | POA: Insufficient documentation

## 2017-08-11 DIAGNOSIS — Z79891 Long term (current) use of opiate analgesic: Secondary | ICD-10-CM | POA: Insufficient documentation

## 2017-08-11 NOTE — Progress Notes (Signed)
Patient's Name: Brittney [name]  MRN: [id number]  Referring Provider: Meade Maw, MD  DOB: [birth year]  PCP: Rusty Aus, MD  DOS: 08/11/2017  Note by: [name], MD  Service setting: Ambulatory outpatient  Specialty: Interventional Pain Management  Location: [facility] (AMB) Pain Management Facility  Visit type: Initial Patient Evaluation  Patient type: New Patient   Primary Reason(s) for Visit: Encounter for initial evaluation of one or more chronic problems (new to examiner) potentially causing chronic pain, and posing a threat to normal musculoskeletal function. (Level of risk: High) CC: Back Pain (lower right s/p spinal surgery in March of 2018); Hip Pain (right); and Leg Pain (right)  HPI  Brittney [name] is a 66 y.o. year old, female patient, who comes today to see [place] for the first time for an initial evaluation of her chronic pain. She has Abnormal tumor markers; Chronic kidney disease, stage II (mild); Degenerative spondylolisthesis; Diabetes mellitus with peripheral vascular disease ([place]); Diabetes with retinopathy ([place]); Diabetic nephropathy ([place]); DM (diabetes mellitus) type II uncontrolled, periph vascular disorder ([place]); Fibromyalgia; Gastroparesis due to DM ([place]); Generalized osteoarthritis of multiple sites; Hepatic cirrhosis ([place]); Hyperlipemia; Lung nodule, multiple; Microalbuminuria; OSA (obstructive sleep apnea); Seronegative arthritis; Severe uncontrolled hypertension; L3-4 severe lumbar spinal stenosis (12/31/2016 MRI); Type 2 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, with long-term current use of insulin ([place]); Unilateral small kidney; Long term current use of opiate analgesic; Long term prescription opiate use; Opiate use; Chronic pain syndrome; Morbid obesity with BMI of 40.0-44.9, adult ([place]); Type 2 diabetes mellitus with diabetic nephropathy, with long-term current use of insulin ([place]); Chronic low back pain (Location  of Primary Source of Pain) (Bilateral) (R>L); 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 Today she comes in for evaluation of her Back Pain (lower right s/p spinal surgery in March of 2018); Hip Pain (right); and Leg Pain (right)  Pain Assessment: Location: Lower, Right Back Radiating: hip and leg on the right, mostly numbness and weakness Onset: More than a month ago Duration: Chronic pain Quality: Aching, Constant Severity: 6 /10 (self-reported pain score)  Note: Reported level is compatible with observation.                   Effect on ADL: unable to do most acitivities after having surgery d/t weakness in right hip and leg and also seems that the back pain is worse s/p surgery in March Timing: Constant Modifying factors: laying down or sitting down  Onset and Duration: Gradual and Date of onset: 2010 Cause of pain: Arthritis Severity: No change since onset, NAS-11 at its worse: 9/10, NAS-11 at its best: 3/10, NAS-11 now: 5/10 and NAS-11 on the average: 5/10 Timing: Not influenced by the time of the day, During activity or exercise and After activity or exercise Aggravating Factors: Bending, Kneeling, Lifiting, Motion, Prolonged standing, Surgery made it worse, Twisting, Walking, Walking uphill, Walking downhill and Working Alleviating Factors: Lying down, Medications, Nerve blocks, Resting, Sitting and Sleeping Associated Problems: Fatigue, Inability to concentrate, Nausea, Numbness, Swelling, Tingling and Weakness Quality of  Pain: Aching,  Agonizing, Deep, Disabling, Distressing, Exhausting, Sickening, Throbbing, Toothache-like and Uncomfortable Previous Examinations or Tests: CT scan, Endoscopy, MRI scan, Nerve block, X-rays, Nerve conduction test and Neurological evaluation Previous Treatments: Epidural steroid injections, Facet blocks, Morphine pump, Narcotic medications, Pool exercises, Radiofrequency and TENS  The patient comes into the clinics today for the first time for a chronic pain management evaluation.   Patient is a 66 year old female with a past medical history significant for myocardial infarction, COPD, cirrhosis of her liver, COPD, type 2 diabetes on insulin, diabetic nephropathy, gout who is referred from Dr. Cari Caraway regarding her axial low back and right leg pain and weakness. Patient is status post a L4-L5 posterior instrument fusion in 2010 and more recently status post lumbar interbody fusion at L3-L4 with anterior plating at L3-L4 for severe lumbar stenosis with neurogenic claudication 03/14/2017.Marland Kitchen  Patient endorses right lower extremity numbness and weakness that is most pronounced above-the-knee. She states that 75% of her pain is in her back with 25% of the pain in her leg. She ambulates with a walker. She has severe disability due to her right leg weakness. In the past specifically prior to her surgery, she has had lumbar epidural steroid injections, facet blocks, radiofrequency ablations performed which were not effective.  In regards to her diabetes, patient's A1c is 7.6. Patient did have an EMG study performed which showed a right L2 radiculopathy and generalized sensorimotor polyneuropathy.  Patient's pain is managed medically by patient's PCP. She is referred here from Dr. Cari Caraway for evaluation of spinal cord stimulation regarding her axial low back and right leg pain.   Historic Controlled Substance Pharmacotherapy Review  PMP and historical list of controlled substances: Tramadol  50 mg, quantity 180, last fill 07/18/2017  Current opioid analgesics: As above MME/day: 30 mg/day Medications: The patient did not bring the medication(s) to the appointment, as requested in our "New Patient Package" Pharmacodynamics: Desired effects: Analgesia: The patient reports >50% benefit. Reported improvement in function: The patient reports medication allows her to accomplish basic ADLs. Clinically meaningful improvement in function (CMIF): Sustained CMIF goals met Perceived effectiveness: Described as relatively effective, allowing for increase in activities of daily living (ADL) Undesirable effects: Side-effects or Adverse reactions: None reported Historical Monitoring: The patient  reports that she does not use drugs. List of all UDS Test(s): No results found for: MDMA, COCAINSCRNUR, PCPSCRNUR, PCPQUANT, CANNABQUANT, THCU, Energy List of all Serum Drug Screening Test(s):  No results found for: AMPHSCRSER, BARBSCRSER, BENZOSCRSER, COCAINSCRSER, PCPSCRSER, PCPQUANT, THCSCRSER, CANNABQUANT, OPIATESCRSER, OXYSCRSER, PROPOXSCRSER Historical Background Evaluation: [place] PDMP: Six (6) year initial data search conducted.             [place] Department of public safety, offender search: Editor, commissioning Information) Non-contributory Risk Assessment Profile: Aberrant behavior: None observed or detected today Risk factors for fatal opioid overdose: None identified today Fatal overdose hazard ratio (HR): Calculation deferred Non-fatal overdose hazard ratio (HR): Calculation deferred Risk of opioid abuse or dependence: 0.7-3.0% with doses ? 36 MME/day and 6.1-26% with doses ? 120 MME/day. Substance use disorder (SUD) risk level: Pending results of Medical Psychology Evaluation for SUD Opioid risk tool (ORT) (Total Score): 3     Opioid Risk Tool - 08/11/17 1134      Family History of Substance Abuse   Alcohol Negative   Illegal Drugs Negative   Rx Drugs Negative     Personal History of Substance Abuse    Alcohol Negative   Illegal Drugs Negative   Rx Drugs Negative  Psychological Disease   Psychological Disease Positive   ADD Negative   OCD Negative   Bipolar Negative   Schizophrenia Negative   Depression Positive  patient takes lexapro for depression and feels that it helps her cope with negative circumstances.     Total Score   Opioid Risk Tool Scoring 3   Opioid Risk Interpretation Low Risk     ORT Scoring interpretation table:  Score <3 = Low Risk for SUD  Score between 4-7 = Moderate Risk for SUD  Score >8 = High Risk for Opioid Abuse   PHQ-2 Depression Scale:  Total score: 0  PHQ-2 Scoring interpretation table: (Score and probability of major depressive disorder)  Score 0 = No depression  Score 1 = 15.4% Probability  Score 2 = 21.1% Probability  Score 3 = 38.4% Probability  Score 4 = 45.5% Probability  Score 5 = 56.4% Probability  Score 6 = 78.6% Probability   PHQ-9 Depression Scale:  Total score: 0  PHQ-9 Scoring interpretation table:  Score 0-4 = No depression  Score 5-9 = Mild depression  Score 10-14 = Moderate depression  Score 15-19 = Moderately severe depression  Score 20-27 = Severe depression (2.4 times higher risk of SUD and 2.89 times higher risk of overuse)   Pharmacologic Plan: Pending ordered tests and/or consults            Initial impression: Pending review of available data and ordered tests.  Meds   Current Meds  Medication Sig  . albuterol (VENTOLIN HFA) 108 (90 Base) MCG/ACT inhaler TAKE 2 PUFFS BY MOUTH 3 TIMES A DAY AS NEEDED  . ALPRAZolam (XANAX) 0.5 MG tablet Take 0.5 mg by mouth 5 (five) times daily.  . bumetanide (BUMEX) 1 MG tablet Take by mouth.  . butalbital-acetaminophen-caffeine (FIORICET, ESGIC) 50-325-40 MG tablet TAKE ONE TABLET BY MOUTH 2 TIMES A DAY AS NEEDED  . carvedilol (COREG) 25 MG tablet Take by mouth.  . cephALEXin (KEFLEX) 500 MG capsule TAKE 1 CAPSULE (500 MG TOTAL) BY MOUTH 4 (FOUR) TIMES DAILY FOR 14  DAYS.  . cloNIDine (CATAPRES - DOSED IN MG/24 HR) 0.2 mg/24hr patch 0.3 patches. Frequency:1XW   Dosage:0.0     Instructions:  Note:Dose: 0.2MG/24H  . diclofenac sodium (VOLTAREN) 1 % GEL Apply topically as needed.   . doxazosin (CARDURA) 4 MG tablet Take 4 mg by mouth daily.   Marland Kitchen escitalopram (LEXAPRO) 10 MG tablet Take 10 mg by mouth daily.   . fluticasone (FLONASE) 50 MCG/ACT nasal spray PLACE 2 SPRAYS INTO BOTH NOSTRILS 2 (TWO) TIMES DAILY.  . hydrALAZINE (APRESOLINE) 25 MG tablet Take 25 mg by mouth daily.   . hydrocortisone 2.5 % cream APPLY TOPICALLY 2 (TWO) TIMES DAILY FOR 10 DAYS.  Marland Kitchen ibuprofen (ADVIL,MOTRIN) 800 MG tablet Take 800 mg by mouth 3 (three) times daily.   . Insulin Detemir (LEVEMIR FLEXTOUCH) 100 UNIT/ML Pen INJECT 90 UNITS SUBCUTANEOUSLY TWICE A DAY  . Insulin Pen Needle (FIFTY50 PEN NEEDLES) 32G X 6 MM MISC 5 (five) times daily.  . insulin regular human CONCENTRATED (HUMULIN R) 500 UNIT/ML kwikpen Inject 70 Units into the skin 3 (three) times daily with meals.   . lidocaine (LIDODERM) 5 % PLACE 1 PATCH ONTO THE MOST PAINFUL AREA OF SKIN DAILY FOR UP TO 12 HOURS IN A 24 HOUR PERIOD  . lisinopril (PRINIVIL,ZESTRIL) 40 MG tablet TAKE 1 TABLET (40 MG TOTAL) BY MOUTH ONCE DAILY.  Marland Kitchen metoCLOPramide (REGLAN) 10 MG tablet Take 10 mg by mouth  3 (three) times daily.  . ondansetron (ZOFRAN) 4 MG tablet Take 4 mg by mouth every 8 (eight) hours as needed for nausea.   . pantoprazole (PROTONIX) 40 MG tablet TAKE 1 TABLET (40 MG TOTAL) BY MOUTH 2 (TWO) TIMES DAILY. TAKE ONE HOUR BEFORE A MEAL  . potassium chloride (K-DUR,KLOR-CON) 10 MEQ tablet TAKE 1 TABLET BY MOUTH ONCE A DAY  . pramipexole (MIRAPEX) 0.5 MG tablet TAKE 1 TABLET (0.5 MG TOTAL) BY MOUTH 2 (TWO) TIMES DAILY.  . traZODone (DESYREL) 150 MG tablet Take 150 mg by mouth at bedtime.   . triamcinolone ointment (KENALOG) 0.1 % APPLY TO AFFECTED AREA AT BEDTIME  . zolpidem (AMBIEN) 10 MG tablet Take 10 mg by mouth at bedtime.      Imaging Review  Cervical Imaging: Cervical DG 1 view:  Results for orders placed in visit on 03/26/03  DG Cervical Spine 1 View   Narrative FINDINGS CLINICAL DATA:  C5-6 ACDF. C-ARM C-ARM FLUOROSCOPY WAS PROVIDED. IMPRESSION C-ARM FLUOROSCOPY WAS PROVIDED. PORTABLE LATERAL CERVICAL SPINE A SINGLE SOMEWHAT OBLIQUE VIEW SHOWS PERFORMANCE OF ANTERIOR CERVICAL DISKECTOMY AND FUSION AT C5- 6.  THE ANTERIOR PLATE APPEARS GROSSLY WELL POSITIONED.  DETAIL DOES NOT REALLY PERMIT EVALUATION OF THE INTERBODY ALLOGRAFT. IMPRESSION ACDF C5-6.   Cervical DG 2-3 views:  Results for orders placed in visit on 05/21/03  DG Cervical Spine 2-3 Views   Narrative FINDINGS CLINICAL DATA:  CERVICAL SPINE FUSION, MARCH OF 2004, NOW WITH PAIN. THREE VIEWS OF CERVICAL SPINE THREE VIEWS OF THE CERVICAL SPINE WERE OBTAINED, INCLUDING NEUTRAL LATERAL VIEW AND VIEWS IN FLEXION AND EXTENSION.  IN THE NEUTRAL LATERAL VIEW, ANTERIOR FUSION IS NOTED AT C5-6.  THE ANTERIOR METALLIC FUSION PLATE AND INTERBODY PLUG ARE IN GOOD POSITION, AND NORMAL HEIGHT IS MAINTAINED.  NORMAL ALIGNMENT IS PRESENT.  THROUGH FLEXION AND EXTENSION, THERE IS SLIGHTLY LIMITED MOTION, BUT NO MALALIGNMENT IS SEEN. NO SIGNIFICANT PREVERTEBRAL SOFT TISSUE SWELLING IS NOTED. IMPRESSION ANTERIOR FUSION AT C5-6.  SLIGHTLY LIMITED RANGE OF MOTION THROUGH FLEXION AND EXTENSION BUT NO MALALIGNMENT.   Lumbar MR wo contrast:  Results for orders placed in visit on 04/24/09  [place] W/O Cm   Narrative  PRIOR REPORT IMPORTED FROM AN EXTERNAL SYSTEM   PRIOR REPORT IMPORTED FROM THE [facility] WORKFLOW SYSTEM   REASON FOR EXAM:    radiculopathy and scoliosis  COMMENTS:   PROCEDURE:     MR  - MR LUMBAR SPINE WO CONTRAST  - Apr 24 2009  5:20PM   RESULT:        There are no prior exams available for comparison.   The conus medullaris terminates at an L1 level.  The cauda equina  demonstrates no evidence of clumping or thickening.   At  the T12-L1 level, L1-2 level and L2-3 level, there is no evidence of  thecal sac stenosis or neural foraminal narrowing.   At the L3-4 level, there is no evidence of thecal sac stenosis or neural  foraminal narrowing.   At the L4-5 level, a broad-based disc bulge is appreciated causing partial  effacement of the anterior CSF space. This contributes to multifactorial  moderate thecal sac stenosis and bilateral mild neural foraminal narrowing  right greater than left. The thecal sac stenosis is also secondary to  ligamentum flavum and facet hypertrophy.   At the L5-S1 level, there is no evidence of thecal sac stenosis or neural  foraminal narrowing.   IMPRESSION:   1.     Moderate  thecal sac stenosis at the L4-5 level with mild bilateral  neural foraminal narrowing.  Mild exiting nerve root compromise  bilaterally  is a diagnostic consideration.  2.     No further evidence of focal or acute pathology.   Thank you for the opportunity to contribute to the care of your patient.       Lumbar MR w/wo contrast:  Results for orders placed during the hospital encounter of 12/31/16  MR LUMBAR SPINE W WO CONTRAST   Narrative CLINICAL DATA:  Chronic back pain and bilateral sciatica. Failed back surgical syndrome.  EXAM: MRI LUMBAR SPINE WITHOUT AND WITH CONTRAST  TECHNIQUE: Multiplanar and multiecho pulse sequences of the lumbar spine were obtained without and with intravenous contrast.  CONTRAST:  22m MULTIHANCE GADOBENATE DIMEGLUMINE 529 MG/ML IV SOLN  COMPARISON:  CT scan of the abdomen and pelvis dated 06/03/2016 and lumbar MRI dated 11/27/2009  FINDINGS: Segmentation:  Standard.  Alignment:  Physiologic.  Vertebrae: Multiple small stable hemangiomata in the spine. Solid interbody fusion at L4-5 with posterior decompression.  Conus medullaris: Extends to the L1-2 level and appears normal.  Paraspinal and other soft tissues: Negative except for chronic postsurgical  changes posteriorly at L4-5.  Disc levels:  T11-12 through L1-2:  Negative.  L2-3: The disc is normal. Slight hypertrophy of the ligamentum flavum. Minimal narrowing of the spinal canal.  L3-4: Progressive now severe bilateral facet arthritis with ligamentum flavum hypertrophy and bilateral joint effusions with severe spinal stenosis. Small broad-based soft disc protrusion contributes to compression of the thecal sac. Moderate bilateral foraminal stenosis.  L4-5: Solid interbody fusion. Excellent posterior decompression. Enhancing scar tissue around the thecal sac to the expected degree. No focal neural impingement. Widely patent neural foramina.  L5-S1: Normal disc. Slight bilateral facet arthritis, left more than right, slightly progressed.  IMPRESSION: 1. Progressive now severe bilateral facet arthritis at L3-4 with new severe spinal stenosis at L3-4. Thecal sac dimensions at minimum are 11.7 x 7.8 mm on image 22 of series 6. 2. Stable postsurgical changes at L4-5.   Electronically Signed   By: JLorriane ShireM.D.   On: 12/31/2016 14:46    Lumbar MR w contrast: No results found for this or any previous visit. Lumbar CT wo contrast:  Results for orders placed during the hospital encounter of 02/18/17  CT LUMBAR SPINE WO CONTRAST   Narrative CLINICAL DATA:  Low back pain  EXAM: CT LUMBAR SPINE WITHOUT CONTRAST  TECHNIQUE: Multidetector CT imaging of the lumbar spine was performed without intravenous contrast administration. Multiplanar CT image reconstructions were also generated.  COMPARISON:  MRI 05/10/2017  FINDINGS: Segmentation: 5 lumbar type vertebrae.  Alignment: Mild levoscoliosis.  Fused L4-5 anterolisthesis.  Vertebrae: Remote L4 superior endplate fracture with right preferential height loss. The right L4 pedicle screw reaches the superior endplate. No acute fracture noted. No evidence of discitis or aggressive bone lesion.  Paraspinal and other  soft tissues: Punctate left renal calculus. Atherosclerotic calcification. Colonic diverticulosis.  Disc levels:  T12- L1: Minimal spondylosis.  Mild facet spurring.  No impingement  L1-L2: Minimal spondylosis.  Mild facet spurring.  No impingement  L2-L3: Minimal spondylosis. Facet hypertrophy. Mild triangular narrowing of the thecal sac.  L3-L4: Severe facet arthropathy with hypertrophy and spurring. Disc narrowing and bulging. Advanced spinal stenosis. A least moderate bilateral foraminal stenosis.  L4-L5: PLIF with solid bony fusion. Posterior decompression. No residual impingement.  L5-S1:Facet arthropathy with moderate spurring. Spondylosis with ventral osteophyte. Patent spinal canal. Mild bilateral foraminal  narrowing.  IMPRESSION: 1. L3-4 advanced adjacent segment facet arthropathy with advanced spinal stenosis. Moderate biforaminal narrowing. 2. L4-5 PLIF with solid bony fusion. 3. Remote L4 superior endplate fracture. The right L4 pedicle screw tip is at the superior endplate.   Electronically Signed   By: Monte Fantasia M.D.   On: 02/18/2017 14:27     Lumbar DG 2-3 views:  Results for orders placed during the hospital encounter of 05/23/09  DG Lumbar Spine 2-3 Views   Narrative Clinical Data: L4-5 laminectomy and fusion   LUMBAR SPINE - 2-3 VIEW   Comparison: None   Findings: Two C-arm images show posterior decompression with fusion at L4-5.  There are pedicle screws, posterior rods and interbody fusion material.  No radiographically detectable complication.   IMPRESSION: PLIF L4-5  Provider: Mila Palmer, Morrell Riddle    Knee Imaging: Knee-R MR w contrast: No results found for this or any previous visit. Knee-L MR w contrast:  Results for orders placed in visit on 06/08/06  MR Knee Left  Wo Contrast   Narrative  PRIOR REPORT IMPORTED FROM THE [facility] WORKFLOW SYSTEM   REASON FOR EXAM:   Bilateral knee pain  COMMENTS:   PROCEDURE:     MR  -  MR KNEE LT  WO CONTRAST  - Jun 08 2006  9:37AM   RESULT:        The patient is complaining of knee discomfort.   Multiplanar images were obtained through the knee.  There is a small  effusion.  There is a moderate size popliteal cyst measuring approximately  1.9 cm in diameter.  The fluid sensitive sequences reveal no evidence of  bone marrow edema.  There is thinning of the articular cartilage of the  patella and the patella is displaced slightly laterally in the patellar  notch of the femur.  The medial and lateral collateral ligament complexes  are intact.  The ACL and PCL are intact.  The medial and lateral menisci  exhibit no evidence of a tear.  The quadriceps and patellar tendons are  intact.  There are small osteophytes noted off the superior and inferior  margins of the patella and there are small osteophytes with the femoral  condyles.   IMPRESSION:   1.     There is no evidence of a meniscal tear or cruciate ligament injury  or collateral ligament injury.  2.     There is a moderate size popliteal cyst and small joint effusion.  3.     There is no evidence of bone marrow edema.  4.     Mild degenerative osteophyte formation is seen in all three joint  compartments.  5.     There is mild thinning of the articular cartilage of the patella  and  slight lateral subluxation of the patella with respect to the underlying  femur.   Thank you for this opportunity to contribute to the care of your patient.       Knee-R MR wo contrast:  Results for orders placed in visit on 06/08/06  MR Knee Right Wo Contrast   Narrative   PRIOR REPORT IMPORTED FROM THE [facility] WORKFLOW SYSTEM   REASON FOR EXAM:  bilateral knee pain  COMMENTS:   PROCEDURE:     MR  - MR KNEE RT  WO  CONTRAST  - Jun 08 2006  9:37AM   RESULT:          Multiplanar images were obtained through the knee.  The  patient has a prior diagnosis of chondromalacia patella.   The quadriceps and patellar tendons are  intact.  There is thinning of the  articular cartilage of the patella with a small amount of subchondral  increased signal seen best on image #5 of the axial sequences.  The  patella  appears to be somewhat laterally subluxed, and there is associated  degenerative change of the lateral aspect of the patellar facet.  Small  osteophytes are noted on other marginal portions of the femoral condyles.   I  do not see evidence of acute bone marrow edema.  There is a moderate-sized  effusion.  No significant popliteal cyst is evident.  The medial and  lateral  collateral ligament complexes are intact.   There is increased signal in the posterior horn of the lateral meniscus  consistent with degenerative change and likely a tiny tear reaching the  inferior joint surface.  The ACL and PCL are intact.  There is some fluid  associated with the ACL, but I do not see objective evidence of a tear.   The  medial meniscus demonstrates very minimal increased intrasubstance signal  both anteriorly and posteriorly, but no frank tear is seen.   IMPRESSION:   1.     There are degenerative changes of the patellofemoral joint  consistent  with the known chondromalacia patella.  I do not see more than minimal  subchondral increased marrow signal.  Slight lateral subluxation of the  patella with respect to the lateral femoral condyle is present.  2.     The cruciate and collateral ligaments are intact.  3.     There are mild degenerative-type signal changes in both menisci,  but  there is evidence of a tiny tear in the posterior horn of the lateral  meniscus.  4.     There is a small effusion.   Thank you for this opportunity to contribute to the care of your patient.       Knee-L DG 1-2 views:  Results for orders placed in visit on 09/24/13  DG Knee 1-2 Views Left   Narrative   PRIOR REPORT IMPORTED FROM THE [facility] WORKFLOW SYSTEM   REASON FOR EXAM:    postop  COMMENTS:   Bedside (portable):Y    PROCEDURE:     DXR - DXR KNEE LEFT AP AND LATERAL  - Sep 24 2013 11:28AM   RESULT:     AP and lateral portable views performed in the recovery room  reveal the patient has undergone left total knee joint prosthesis  placement.  Radiographic positioning of the prosthetic components is good. There are  surgical drain lines and skin staples present.   IMPRESSION:      The patient has undergone left knee joint prosthesis  placement. Further interpretation is deferred to Dr. Marry Guan.   Dictation Site: 2       Complexity Note: Imaging results reviewed. Results discussed using Layman's terms.               ROS  Cardiovascular History: Heart trouble, Abnormal heart rhythm, High blood pressure, Chest pain, Heart attack ( Date: 09/2014) and Needs antibiotics prior to dental procedures Pulmonary or Respiratory History: Lung problems, Wheezing and difficulty taking a deep full breath (Asthma), Shortness of breath, Snoring , Coughing up mucus (Bronchitis) and Temporary stoppage of breathing during sleep Neurological History: Abnormal skin sensations (Peripheral Neuropathy) and Curved spine Review of Past Neurological Studies:  Results for orders placed or performed in  visit on 03/14/11  MR Brain W Wo Contrast   Narrative     PRIOR REPORT IMPORTED FROM THE [employer] WORKFLOW SYSTEM   REASON FOR EXAM:    RT eyelid ptosis  hx of Bells Palsy  COMMENTS:   PROCEDURE:     MR  - MR BRAIN WO/W CONTRAST  - Mar 14 2011 12:13PM   RESULT:   HISTORY: Visual loss.   COMPARISON STUDIES:  No prior.   PROCEDURE AND FINDINGS:  Multiplanar, multisequence imaging of the brain  is  obtained. 20 ml of Multi-Hance was administered to the patient. No mass  lesion or enhancing lesion identified. Diffusion weighted images reveal no  evidence of acute ischemia. The posterior fossa including the eighth nerve  complexes are normal.   IMPRESSION:      No acute or focal abnormality.   Thank you for this  opportunity to contribute to the care of your patient.       Psychological-Psychiatric History: Anxiousness and Prone to panicking Gastrointestinal History: Reflux or heatburn and Alternating episodes iof diarrhea and constipation (IBS-Irritable bowe syndrome) Genitourinary History: Kidney disease and Passing kidney stones Hematological History: Weakness due to low blood hemoglobin or red blood cell count (Anemia), Brusing easily and Bleeding easily Endocrine History: High blood sugar requiring insulin (IDDM) Rheumatologic History: Joint aches and or swelling due to excess weight (Osteoarthritis) and Generalized muscle aches (Fibromyalgia) Musculoskeletal History: Negative for myasthenia gravis, muscular dystrophy, multiple sclerosis or malignant hyperthermia Work History: Disabled and Out of work due to pain  Allergies  Brittney [name] is allergic to amlodipine; imdur [isosorbide dinitrate]; lyrica [pregabalin]; monosodium glutamate; shellfish allergy; tape; and eggs or egg-derived products.  Laboratory Chemistry  Inflammation Markers (CRP: Acute Phase) (ESR: Chronic Phase) Lab Results  Component Value Date   CRP 1.2 (H) 12/07/2016   ESRSEDRATE 56 (H) 12/07/2016                 Renal Function Markers Lab Results  Component Value Date   BUN 13 12/07/2016   CREATININE 0.66 12/07/2016   GFRAA >60 12/07/2016   GFRNONAA >60 12/07/2016                 Hepatic Function Markers Lab Results  Component Value Date   AST 34 12/07/2016   ALT 20 12/07/2016   ALBUMIN 3.7 12/07/2016   ALKPHOS 106 12/07/2016                 Electrolytes Lab Results  Component Value Date   NA 139 12/07/2016   K 3.7 12/07/2016   CL 103 12/07/2016   CALCIUM 9.9 12/07/2016   MG 1.8 12/07/2016                 Neuropathy Markers Lab Results  Component Value Date   VITAMINB12 787 12/07/2016                 Bone Pathology Markers Lab Results  Component Value Date   ALKPHOS 106 12/07/2016    25OHVITD1 26 (L) 12/07/2016   25OHVITD2 1.2 12/07/2016   25OHVITD3 25 12/07/2016   CALCIUM 9.9 12/07/2016                 Coagulation Parameters Lab Results  Component Value Date   INR 0.95 09/20/2016   LABPROT 12.7 09/20/2016   APTT 34.9 10/22/2014   PLT 202 07/13/2017                 Cardiovascular Markers Lab Results  Component Value Date   HGB 12.0 07/13/2017   HCT 36.0 07/13/2017                 Note: Lab results reviewed.  [place]  Drug: Brittney [name]  reports that she does not use drugs. Alcohol:  reports that she does not drink alcohol. Tobacco:  reports that she has never smoked. She has never used smokeless tobacco. Medical:  has a past medical history of Abdominal pain, left upper quadrant (12/11/2012); Arthritis; Asthma; Cancer ([place]); Collagen vascular disease ([place]); Diabetes mellitus without complication ([place]); Diabetic nephropathy associated with secondary diabetes mellitus ([place]); Flushing (12/11/2012); Gastroparesis; Gross hematuria (12/11/2012); Hepatic cirrhosis ([place]); Hyperlipidemia; Hypertension; Kidney stone (12/11/2012); Morbid obesity with BMI of 40.0-44.9, adult ([place]); Nausea without vomiting (12/11/2012); Nephrolithiasis; Nephrolithiasis (04/26/2014); Nonproliferative retinopathy due to secondary diabetes ([place]); Peripheral vascular disease ([place]); Renal colic (35/36/1443); Sciatica; Sciatica (12/11/2012); Skin cancer; Sleep apnea; and Unilateral small kidney without contralateral hypertrophy. Family: family history includes Asthma in her mother; Cancer in her mother; Diabetes in her father; Heart disease in her mother; Hypertension in her father; Kidney disease in her father.  Past Surgical History:  Procedure Laterality Date  . ABDOMINAL HYSTERECTOMY    . BACK SURGERY  04/1999   L-4-5 LAMINECTOMY  . CESAREAN SECTION  1980  . COLONOSCOPY  05/04/2001  . COLONOSCOPY WITH PROPOFOL N/A 09/20/2016   Procedure: COLONOSCOPY WITH PROPOFOL;  Surgeon: Lollie Sails,  MD;  Location: [facility] ENDOSCOPY;  Service: Endoscopy;  Laterality: N/A;  . DILATION AND CURETTAGE OF UTERUS    . ESOPHAGOGASTRODUODENOSCOPY  02/08/2014  . ESOPHAGOGASTRODUODENOSCOPY (EGD) WITH PROPOFOL N/A 09/20/2016   Procedure: ESOPHAGOGASTRODUODENOSCOPY (EGD) WITH PROPOFOL;  Surgeon: Lollie Sails, MD;  Location: [facility] ENDOSCOPY;  Service: Endoscopy;  Laterality: N/A;  . EYE SURGERY  1986   FOR MELANOMA  . JOINT REPLACEMENT Left 09/24/2013   TOTAL KNEE  . MOHS SURGERY  11/2016   left side of nose  . TONSILLECTOMY     WITH ADENOIDECTOMY   Active Ambulatory Problems    Diagnosis Date Noted  . Abnormal tumor markers 12/11/2012  . Chronic kidney disease, stage II (mild) 12/11/2012  . Degenerative spondylolisthesis 09/08/2016  . Diabetes mellitus with peripheral vascular disease ([place]) 03/16/2016  . Diabetes with retinopathy ([place]) 12/07/2016  . Diabetic nephropathy ([place]) 12/07/2016  . DM (diabetes mellitus) type II uncontrolled, periph vascular disorder ([place]) 04/26/2014  . Fibromyalgia 04/15/2016  . Gastroparesis due to DM ([place]) 12/07/2016  . Generalized osteoarthritis of multiple sites 10/22/2016  . Hepatic cirrhosis ([place]) 06/20/2016  . Hyperlipemia 04/26/2014  . Lung nodule, multiple 08/31/2016  . Microalbuminuria 02/20/2016  . OSA (obstructive sleep apnea) 04/26/2014  . Seronegative arthritis 04/15/2016  . Severe uncontrolled hypertension 01/27/2016  . L3-4 severe lumbar spinal stenosis (12/31/2016 MRI) 09/08/2016  . Type 2 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, with long-term current use of insulin ([place]) 03/16/2016  . Unilateral small kidney 12/11/2012  . Long term current use of opiate analgesic 12/07/2016  . Long term prescription opiate use 12/07/2016  . Opiate use 12/07/2016  . Chronic pain syndrome 12/07/2016  . Morbid obesity with BMI of 40.0-44.9, adult ([place]) 03/16/2016  . Type 2 diabetes mellitus with  diabetic nephropathy, with long-term current use of insulin ([place]) 03/16/2016  . Chronic low back pain (Location of Primary Source of Pain) (Bilateral) (R>L) 12/07/2016  . 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 Chronic lower extremity pain (Location of Secondary  source of pain) (Bilateral) (R>L) 12/07/2016  . Chronic knee pain (Location of Tertiary source of pain) (Right) 12/07/2016  . Osteoarthritis of knee (Right) 12/07/2016  . Grade 1 Anterolisthesis of L3 over L4 and L4 over L5 12/07/2016  . Osteoarthritis of sacroiliac joint (Right) 12/07/2016  . L3-4 severe lumbar facet hypertrophy and spinal stenosis 01/10/2017  . Neurogenic pain 01/17/2017  . Long term prescription benzodiazepine use 01/18/2017  . Vitamin D insufficiency 01/18/2017  . Iron deficiency anemia 05/10/2017   Resolved Ambulatory Problems    Diagnosis Date Noted  . Abdominal pain, left upper quadrant 12/11/2012  . Kidney stone 12/11/2012  . Flushing 12/11/2012  . Gross hematuria 12/11/2012  . Nausea without vomiting 12/11/2012  . Nephrolithiasis 04/26/2014  . Renal colic 53/61/4431  . Sciatica 12/11/2012   Past Medical History:  Diagnosis Date  . Abdominal pain, left upper quadrant 12/11/2012  . Arthritis   . Asthma   . Cancer ([place])   . Collagen vascular disease ([place])   . Diabetes mellitus without complication ([place])   . Diabetic nephropathy associated with secondary diabetes mellitus ([place])   . Flushing 12/11/2012  . Gastroparesis   . Gross hematuria 12/11/2012  . Hepatic cirrhosis ([place])   . Hyperlipidemia   . Hypertension   . Kidney stone 12/11/2012  . Morbid obesity with BMI of 40.0-44.9, adult ([place])   . Nausea without vomiting 12/11/2012  . Nephrolithiasis   . Nephrolithiasis 04/26/2014  . Nonproliferative retinopathy due to secondary diabetes ([place])   . Peripheral vascular disease ([place])   . Renal colic 54/00/8676  . Sciatica   . Sciatica 12/11/2012  . Skin cancer   . Sleep apnea    . Unilateral small kidney without contralateral hypertrophy    Constitutional Exam  General appearance: in no distress and moderately obese very pleasant  Vitals:   08/11/17 1123  BP: (!) 140/56  Pulse: (!) 36  Resp: 16  Temp: 98.7 F (37.1 C)  TempSrc: Oral  SpO2: 97%  Weight: 273 lb (123.8 kg)  Height: _0  (1.676 m)   BMI Assessment: Estimated body mass index is 44.06 kg/m as calculated from the following:   Height as of this encounter: _1  (1.676 m).   Weight as of this encounter: 273 lb (123.8 kg).  BMI interpretation table: BMI level Category Range association with higher incidence of chronic pain  <18 kg/m2 Underweight   18.5-24.9 kg/m2 Ideal body weight   25-29.9 kg/m2 Overweight Increased incidence by 20%  30-34.9 kg/m2 Obese (Class I) Increased incidence by 68%  35-39.9 kg/m2 Severe obesity (Class II) Increased incidence by 136%  >40 kg/m2 Extreme obesity (Class III) Increased incidence by 254%   BMI Readings from Last 4 Encounters:  08/11/17 44.06 kg/m  07/13/17 44.52 kg/m  05/11/17 43.58 kg/m  01/18/17 44.87 kg/m   Wt Readings from Last 4 Encounters:  08/11/17 273 lb (123.8 kg)  07/13/17 275 lb 12.8 oz (125.1 kg)  05/11/17 270 lb (122.5 kg)  01/18/17 278 lb (126.1 kg)  Psych/Mental status: Alert, oriented x 3 (person, place, & time)       Eyes: PERLA Respiratory: No evidence of acute respiratory distress  Cervical Spine Area Exam  Skin & Axial Inspection: No masses, redness, edema, swelling, or associated skin lesions Alignment: Symmetrical Functional ROM: Unrestricted ROM      Stability: No instability detected Muscle Tone/Strength: Functionally intact. No obvious neuro-muscular anomalies detected. Sensory (Neurological): Unimpaired Palpation: No palpable anomalies  Upper Extremity (UE) Exam    Side: Right upper extremity  Side: Left upper extremity  Skin & Extremity Inspection: Skin color, temperature, and hair growth are  WNL. No peripheral edema or cyanosis. No masses, redness, swelling, asymmetry, or associated skin lesions. No contractures.  Skin & Extremity Inspection: Skin color, temperature, and hair growth are WNL. No peripheral edema or cyanosis. No masses, redness, swelling, asymmetry, or associated skin lesions. No contractures.  Functional ROM: Unrestricted ROM          Functional ROM: Unrestricted ROM          Muscle Tone/Strength: Functionally intact. No obvious neuro-muscular anomalies detected.  Muscle Tone/Strength: Functionally intact. No obvious neuro-muscular anomalies detected.  Sensory (Neurological): Unimpaired  Sensory (Neurological): Unimpaired  Palpation: No palpable anomalies              Palpation: No palpable anomalies              Specialized Test(s): Deferred         Specialized Test(s): Deferred          Thoracic Spine Area Exam  Skin & Axial Inspection: No masses, redness, or swelling Alignment: Symmetrical Functional ROM: Unrestricted ROM Stability: No instability detected Muscle Tone/Strength: Functionally intact. No obvious neuro-muscular anomalies detected. Sensory (Neurological): Unimpaired Muscle strength & Tone: No palpable anomalies  Lumbar Spine Area Exam  Skin & Axial Inspection: No masses, redness, or swelling Alignment: Symmetrical Functional ROM: Decreased ROM      Stability: No instability detected Muscle Tone/Strength: Functionally intact. No obvious neuro-muscular anomalies detected. Sensory (Neurological): Movement-associated pain Palpation: Complains of area being tender to palpation       Provocative Tests: Lumbar Hyperextension and rotation test: Positive       Lumbar Lateral bending test: Negative       Patrick's Maneuver: evaluation deferred today                    Gait & Posture Assessment  Ambulation: Patient ambulates using a walker Gait: Very limited, using assistive device to ambulate Posture: Difficulty standing up straight, due to pain    Lower Extremity Exam    Side: Right lower extremity  Side: Left lower extremity  Skin & Extremity Inspection: Atrophy  Skin & Extremity Inspection: Skin color, temperature, and hair growth are WNL. No peripheral edema or cyanosis. No masses, redness, swelling, asymmetry, or associated skin lesions. No contractures.  Functional ROM: Diminished ROM          Functional ROM: Unrestricted ROM          Muscle Tone/Strength: Right lower extremity 3-/5 hip flexion, 4-/5 knee extension, knee flexion, 4/5 plantar flexion dorsiflexion  Muscle Tone/Strength: Functionally intact. No obvious neuro-muscular anomalies detected.  Sensory (Neurological): Neuropathic pain pattern  Sensory (Neurological): Unimpaired  Palpation: Tender  Palpation: No palpable anomalies   Assessment  Primary Diagnosis & Pertinent Problem List: The primary encounter diagnosis was Status post lumbar spine surgery for decompression of spinal cord. Diagnoses of Spinal stenosis of thoracic region, L3-4 severe lumbar spinal stenosis (12/31/2016 MRI), Failed back surgical syndrome, Chronic lower extremity pain (Location of Secondary source of pain) (Bilateral) (R>L), Chronic knee pain (Location of Tertiary source of pain) (Right), Neurogenic pain, and Chronic pain syndrome were also pertinent to this visit.  Visit Diagnosis (New problems to examiner): 1. Status post lumbar spine surgery for decompression of spinal cord   2. Spinal stenosis of thoracic region   3. L3-4 severe lumbar spinal stenosis (12/31/2016  MRI)   4. Failed back surgical syndrome   5. Chronic lower extremity pain (Location of Secondary source of pain) (Bilateral) (R>L)   6. Chronic knee pain (Location of Tertiary source of pain) (Right)   7. Neurogenic pain   8. Chronic pain syndrome    Plan of Care (Initial workup plan)  66 year old female with a past medical history of MI, type 2 diabetes on insulin last A1c 7.6, obesity who presents as a referral from Dr.  Cari Caraway for low back and right leg pain status post lumbar spine surgery 2 with her most recent one being March 2018. Patient endorses severe pain in her back and in her right leg. Rates axial low back pain is 75%, right leg pain is 25%. She notes severe numbness in her right thigh which does not extend past the knee. She has significant motor weakness on exam including hip flexors. Her EMG studies show a L2 radiculopathy along with a generalized sensorimotor polyneuropathy. This is consistent with her physical exam given weakness to hip flexion which is innervated by the L2 and L3 nerve roots via the femoral nerve.  I had an extensive discussion with the patient about the potential risks and benefits of spinal cord stimulation. Patient does not have any caregivers in the house and performs her ADLs on her own. She states that after her previous surgeries, she has recovered on her own which is been difficult for her but she has managed. She is very eager to try and find something for her pain relief and weakness in her right leg.  I did emphasize to the patient that given her body habitus and her type 2 diabetes she does carry an increased risk of infection with the procedure and implant. Patient is not on any blood thinners. I would like the patient to get a thoracic MRI to evaluate for thoracic stenosis and planning for SCS trial and possible implant. The patient does have moderate or severe thoracic stenosis, she may not be a candidate for an SCS trial. Furthermore since the patient notes worsening pain of her right thigh and worsening weakness, it is reasonable to obtain a repeat MRI of her lumbar spine.   I will also send the patient to Dr. Olive Bass at [facility] pain psychology for evaluation regarding suitability of SCS trial and implant. Patient does not have any caregivers in her house and performs ADLs all herself. She states that she has recovered from all of her surgeries on her own although it has  been difficult for the first couple of days postoperatively.  I'll see the patient back in 1 month or earlier depending on completion of thoracic MRI and pain psychology evaluation.  Plan: #1. Thoracic MRI without contrast to evaluate for thoracic stenosis. Lumbar MRI with contrast given worsening right leg pain and weakness.  #2. Referral to [facility] pain psychology to see Dr. Olive Bass for suitability of SCS trial and possible implant  #3. Provided patient with DVD regarding spinal cord stimulation  #4. Follow-up in one month  Ordered Lab-work, Procedure(s), Referral(s), & Consult(s): Orders Placed This Encounter  Procedures  . MR THORACIC SPINE WO CONTRAST  . MR Lumbar Spine W Wo Contrast  . Ambulatory referral to Psychology   Interventional management options: Brittney [name] was informed that there is no guarantee that she would be a candidate for interventional therapies. The decision will be based on the results of diagnostic studies, as well as Brittney [name] risk profile.  Procedure(s) under consideration:  -Spinal  cord stimulation trial    Provider-requested follow-up: Return in about 4 weeks (around 09/08/2017).  Future Appointments Date Time Provider [place]  09/08/2017 1:30 PM [name], MD [facility] None  10/12/2017 1:45 PM [facility] None  10/12/2017 2:00 PM Lloyd Huger, MD [facility] None  10/12/2017 2:15 PM [facility] INFUSION CHAIR 11 [facility] None    Primary Care Physician: Rusty Aus, MD Location: [facility] Note by: [name], M.D, Date: 08/11/2017; Time: 1:23 PM  Patient Instructions  Today we did the following  -I will send you for an MRI of your thoracic and lumbar spine. We want to make sure that she don't have any stenosis or narrowing of your thoracic spine before scheduling you for a spinal cord stimulator trial. -I will also send you to Dr. Dante Gang at [facility] who is a Pain  Psychologist regarding psychological evaluation for suitability of spinal cord stimulation. -Follow up in 1 month or earlier (pending MRI and psychological evaluation) was normal    -Remember to bring all your pain pills to every clinic visit, in their original containers. -Because of the high volume of calls we receive and the high demand for our clinical services, we are occupied all day providing care for patients in the clinic. This leaves little time to respond to phone calls, and we are generally unable to discuss patient care advice over the telephone. If you are experiencing a medication side effect or complication, you can call and let us know, but we will typically not make a medication substitution or change over the telephone. We are generally unable to respond acutely to a flare up of pain, as this is quite common in our patients and needs to be dealt with as part of the long term management plan. Please make an appointment with [place] if you wish to discuss a matter in any detail. Should you still need to call, please do so at . Please do not call for early medication refills.  Thank you for choosing [facility]. It was a pleasure to see you in clinic today. Please contact us with any questions or concerns at  [name], MD

## 2017-08-11 NOTE — Progress Notes (Signed)
Safety precautions to be maintained throughout the outpatient stay will include: orient to surroundings, keep bed in low position, maintain call bell within reach at all times, provide assistance with transfer out of bed and ambulation.  

## 2017-08-11 NOTE — Patient Instructions (Addendum)
Today we did the following  -I will send you for an MRI of your thoracic and lumbar spine. We want to make sure that she don't have any stenosis or narrowing of your thoracic spine before scheduling you for a spinal cord stimulator trial. -I will also send you to Dr. Dante Gang at [facility] who is a Pain Psychologist regarding psychological evaluation for suitability of spinal cord stimulation. -Follow up in 1 month or earlier (pending MRI and psychological evaluation) was normal    -Remember to bring all your pain pills to every clinic visit, in their original containers. -Because of the high volume of calls we receive and the high demand for our clinical services, we are occupied all day providing care for patients in the clinic. This leaves little time to respond to phone calls, and we are generally unable to discuss patient care advice over the telephone. If you are experiencing a medication side effect or complication, you can call and let us know, but we will typically not make a medication substitution or change over the telephone. We are generally unable to respond acutely to a flare up of pain, as this is quite common in our patients and needs to be dealt with as part of the long term management plan. Please make an appointment with [place] if you wish to discuss a matter in any detail. Should you still need to call, please do so at . Please do not call for early medication refills.  Thank you for choosing [facility]. It was a pleasure to see you in clinic today. Please contact us with any questions or concerns at  [name], MD

## 2017-08-18 ENCOUNTER — Ambulatory Visit
Admission: RE | Admit: 2017-08-18 | Discharge: 2017-08-18 | Disposition: A | Discharge status: Home / Self Care | Payer: Medicare Other | Source: Ambulatory Visit | Attending: Student in an Organized Health Care Education/Training Program | Admitting: Student in an Organized Health Care Education/Training Program

## 2017-08-18 DIAGNOSIS — M47816 Spondylosis without myelopathy or radiculopathy, lumbar region: Secondary | ICD-10-CM | POA: Diagnosis not present

## 2017-08-18 DIAGNOSIS — M6250 Muscle wasting and atrophy, not elsewhere classified, unspecified site: Secondary | ICD-10-CM | POA: Diagnosis not present

## 2017-08-18 DIAGNOSIS — Z981 Arthrodesis status: Secondary | ICD-10-CM | POA: Insufficient documentation

## 2017-08-18 DIAGNOSIS — M48061 Spinal stenosis, lumbar region without neurogenic claudication: Secondary | ICD-10-CM | POA: Diagnosis not present

## 2017-08-18 DIAGNOSIS — M4804 Spinal stenosis, thoracic region: Secondary | ICD-10-CM

## 2017-08-18 DIAGNOSIS — Z9889 Other specified postprocedural states: Secondary | ICD-10-CM

## 2017-08-18 DIAGNOSIS — M4316 Spondylolisthesis, lumbar region: Secondary | ICD-10-CM | POA: Insufficient documentation

## 2017-08-18 MED ORDER — GADOBENATE DIMEGLUMINE 529 MG/ML IV SOLN
20.0000 mL | Freq: Once | INTRAVENOUS | Status: AC | PRN
  Administered 2017-08-18: 20 mL via INTRAVENOUS

## 2017-08-22 ENCOUNTER — Telehealth: Payer: Self-pay | Admitting: *Deleted

## 2017-09-07 ENCOUNTER — Ambulatory Visit: Payer: Medicare Other | Admitting: Student in an Organized Health Care Education/Training Program

## 2017-09-08 ENCOUNTER — Ambulatory Visit: Payer: Medicare Other | Admitting: Student in an Organized Health Care Education/Training Program

## 2017-09-14 ENCOUNTER — Encounter: Payer: Self-pay | Admitting: Student in an Organized Health Care Education/Training Program

## 2017-09-14 ENCOUNTER — Ambulatory Visit
Payer: Medicare Other | Attending: Student in an Organized Health Care Education/Training Program | Admitting: Student in an Organized Health Care Education/Training Program

## 2017-09-14 VITALS — BP 174/64 | HR 68 | Resp 18 | Ht 66.0 in | Wt 272.0 lb

## 2017-09-14 DIAGNOSIS — D509 Iron deficiency anemia, unspecified: Secondary | ICD-10-CM | POA: Diagnosis not present

## 2017-09-14 DIAGNOSIS — M797 Fibromyalgia: Secondary | ICD-10-CM | POA: Diagnosis not present

## 2017-09-14 DIAGNOSIS — M48061 Spinal stenosis, lumbar region without neurogenic claudication: Secondary | ICD-10-CM | POA: Insufficient documentation

## 2017-09-14 DIAGNOSIS — M79604 Pain in right leg: Secondary | ICD-10-CM | POA: Diagnosis not present

## 2017-09-14 DIAGNOSIS — M4316 Spondylolisthesis, lumbar region: Secondary | ICD-10-CM | POA: Insufficient documentation

## 2017-09-14 DIAGNOSIS — E11319 Type 2 diabetes mellitus with unspecified diabetic retinopathy without macular edema: Secondary | ICD-10-CM | POA: Insufficient documentation

## 2017-09-14 DIAGNOSIS — Z9889 Other specified postprocedural states: Secondary | ICD-10-CM | POA: Diagnosis not present

## 2017-09-14 DIAGNOSIS — M79605 Pain in left leg: Secondary | ICD-10-CM | POA: Diagnosis not present

## 2017-09-14 DIAGNOSIS — M5416 Radiculopathy, lumbar region: Secondary | ICD-10-CM

## 2017-09-14 DIAGNOSIS — G4733 Obstructive sleep apnea (adult) (pediatric): Secondary | ICD-10-CM | POA: Insufficient documentation

## 2017-09-14 DIAGNOSIS — I129 Hypertensive chronic kidney disease with stage 1 through stage 4 chronic kidney disease, or unspecified chronic kidney disease: Secondary | ICD-10-CM | POA: Diagnosis not present

## 2017-09-14 DIAGNOSIS — Z6841 Body Mass Index (BMI) 40.0 and over, adult: Secondary | ICD-10-CM | POA: Insufficient documentation

## 2017-09-14 DIAGNOSIS — E1121 Type 2 diabetes mellitus with diabetic nephropathy: Secondary | ICD-10-CM | POA: Insufficient documentation

## 2017-09-14 DIAGNOSIS — E1122 Type 2 diabetes mellitus with diabetic chronic kidney disease: Secondary | ICD-10-CM | POA: Diagnosis not present

## 2017-09-14 DIAGNOSIS — E785 Hyperlipidemia, unspecified: Secondary | ICD-10-CM | POA: Diagnosis not present

## 2017-09-14 DIAGNOSIS — Z79899 Other long term (current) drug therapy: Secondary | ICD-10-CM | POA: Insufficient documentation

## 2017-09-14 DIAGNOSIS — G8929 Other chronic pain: Secondary | ICD-10-CM | POA: Diagnosis not present

## 2017-09-14 DIAGNOSIS — E559 Vitamin D deficiency, unspecified: Secondary | ICD-10-CM | POA: Diagnosis not present

## 2017-09-14 DIAGNOSIS — M47896 Other spondylosis, lumbar region: Secondary | ICD-10-CM

## 2017-09-14 DIAGNOSIS — M4804 Spinal stenosis, thoracic region: Secondary | ICD-10-CM

## 2017-09-14 DIAGNOSIS — E1165 Type 2 diabetes mellitus with hyperglycemia: Secondary | ICD-10-CM | POA: Diagnosis not present

## 2017-09-14 DIAGNOSIS — K3184 Gastroparesis: Secondary | ICD-10-CM | POA: Insufficient documentation

## 2017-09-14 DIAGNOSIS — M961 Postlaminectomy syndrome, not elsewhere classified: Secondary | ICD-10-CM | POA: Diagnosis not present

## 2017-09-14 DIAGNOSIS — E1143 Type 2 diabetes mellitus with diabetic autonomic (poly)neuropathy: Secondary | ICD-10-CM | POA: Diagnosis not present

## 2017-09-14 DIAGNOSIS — Z794 Long term (current) use of insulin: Secondary | ICD-10-CM | POA: Diagnosis not present

## 2017-09-14 DIAGNOSIS — N182 Chronic kidney disease, stage 2 (mild): Secondary | ICD-10-CM | POA: Insufficient documentation

## 2017-09-14 DIAGNOSIS — M47816 Spondylosis without myelopathy or radiculopathy, lumbar region: Secondary | ICD-10-CM

## 2017-09-14 DIAGNOSIS — I252 Old myocardial infarction: Secondary | ICD-10-CM | POA: Insufficient documentation

## 2017-09-14 NOTE — Patient Instructions (Addendum)
It was nice seeing you today Brittney [name]. Thank you for seeing Dr. Olive Bass at [facility].  We had an extensive discussion about spinal cord stimulator trial and you would like to proceed with this. We discussed the risks and benefits of this procedure. All questions were answered. I will call you next week to confirm that you do not have any additional questions regarding the procedure.  We will provide you with a cleaning solution/scrub that I would like for you to use the night before and in the morning of your procedure.  We will plan on doing the spinal cord stimulator trial with [facility]. Plan for 7 day trial.  We will tentatively schedule a trial for October 1 at 8:30 AM. If you are unable to do it then, we will have October 15 as a backup date. Please call us within the next 2 days and let us know which day you would like to go with but for now I will schedule you for October 1.  Please bring driver that day.

## 2017-09-14 NOTE — Progress Notes (Signed)
Safety precautions to be maintained throughout the outpatient stay will include: orient to surroundings, keep bed in low position, maintain call bell within reach at all times, provide assistance with transfer out of bed and ambulation.  

## 2017-09-14 NOTE — Progress Notes (Signed)
Patient's Name: ANSHIKA PETHTEL  MRN: [id number]  Referring Provider: Rusty Aus, MD  DOB: 30-Oct-1951  PCP: Rusty Aus, MD  DOS: 09/14/2017  Note by: [name], MD  Service setting: Ambulatory outpatient  Specialty: Interventional Pain Management  Location: [facility] (AMB) Pain Management Facility    Patient type: Established   Primary Reason(s) for Visit: Encounter for evaluation before starting new chronic pain management plan of care (Level of risk: moderate) CC: Back Pain (low) and Leg Pain (bilateral)  HPI  Ms. Haupt is a 66 y.o. year old, female patient, who comes today for a follow-up evaluation to review the test results and decide on a treatment plan. She has Abnormal tumor markers; Chronic kidney disease, stage II (mild); Degenerative spondylolisthesis; Diabetes mellitus with peripheral vascular disease ([place]); Diabetes with retinopathy ([place]); Diabetic nephropathy ([place]); DM (diabetes mellitus) type II uncontrolled, periph vascular disorder ([place]); Fibromyalgia; Gastroparesis due to DM ([place]); Generalized osteoarthritis of multiple sites; Hepatic cirrhosis ([place]); Hyperlipemia; Lung nodule, multiple; Microalbuminuria; OSA (obstructive sleep apnea); Seronegative arthritis; Severe uncontrolled hypertension; L3-4 severe lumbar spinal stenosis (12/31/2016 MRI); Type 2 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, with long-term current use of insulin ([place]); Unilateral small kidney; Long term current use of opiate analgesic; Long term prescription opiate use; Opiate use; Chronic pain syndrome; Morbid obesity with BMI of 40.0-44.9, adult ([place]); Type 2 diabetes mellitus with diabetic nephropathy, with long-term current use of insulin ([place]); Chronic low back pain (Location of Primary Source of Pain) (Bilateral) (R>L); 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 Her primarily concern today is the Back Pain (low) and Leg Pain (bilateral)  Pain Assessment: Location: Lower Back Radiating: legs Onset: More than a month ago Duration: Chronic pain Quality: Aching, Constant Severity: 8 /10 (self-reported pain score)  Note: Reported level is compatible with observation.                   Effect on ADL:   Timing: Constant Modifying factors: rest, medications  Ms. Faulk comes in today for a follow-up visit after her initial evaluation on 08/22/2017. Today we went over the results of her tests which included her thoracic MRI as well as her psychology evaluation regarding spinal cord stimulator implant.  Patient met with Dr. Olive Bass at [facility] pain psychology for spinal cord stimulator psychological evaluation. Patient was deemed to be a good candidate for SCS from a psych standpoint however her lack of social support could have an impact on her postprocedural and post implant care. Patient has undergone lumbar spine surgery in similar social situation with limited help at home.  Patient states that her session with Dr. Olive Bass was helpful that they plan to continue in the future.   Today we will discuss risks and benefits of spinal cord stimulator trial in our answer any questions or concerns at Ms. Hagenow has.  Further details on both, my assessment(s), as well as the proposed treatment plan, please see below.  Controlled Substance Pharmacotherapy Assessment REMS (Risk Evaluation and Mitigation Strategy)  PMP and historical list of controlled substances: Tramadol  50 mg, quantity  180, last fill 07/18/2017  MME/day: 30 mg/day Pill Count: None expected due to no prior prescriptions written by our practice. Hart [place], RN  09/14/2017  2:26 PM  Sign at close encounter Safety precautions to be maintained throughout the outpatient stay will include: orient to surroundings, keep bed in low position, maintain call bell within reach at all times, provide assistance with transfer out of bed and ambulation.    Pharmacokinetics: Liberation and absorption (onset of action): WNL Distribution (time to peak effect): WNL Metabolism and excretion (duration of action): WNL         Pharmacodynamics: Desired effects: Analgesia: Ms. Bellizzi reports >50% benefit. Functional ability: Patient reports that medication allows her to accomplish basic ADLs Clinically meaningful improvement in function (CMIF): Sustained CMIF goals met Perceived effectiveness: Described as relatively effective, allowing for increase in activities of daily living (ADL) Undesirable effects: Side-effects or Adverse reactions: None reported Monitoring: [place] PMP: Online review of the past 58-monthperiod previously conducted. Not applicable at this point since we have not taken over the patient's medication management yet. List of all Serum Drug Screening Test(s):  No results found for: AMPHSCRSER, BARBSCRSER, BENZOSCRSER, COCAINSCRSER, PCPSCRSER, THCSCRSER, OPIATESCRSER, [place] PLakewoodList of all UDS test(s) done:  Lab Results  Component Value Date   SUMMARY FINAL 12/07/2016   Last UDS on record: Summary  Date Value Ref Range Status  12/07/2016 FINAL  Final    Comment:    ==================================================================== TOXASSURE COMP DRUG ANALYSIS,UR ==================================================================== Test                             Result       Flag       Units Drug Present and Declared for Prescription Verification   Alprazolam                     350           EXPECTED   ng/mg creat   Alpha-hydroxyalprazolam        1573         EXPECTED   ng/mg creat    Source of alprazolam is a scheduled prescription medication.    Alpha-hydroxyalprazolam is an expected metabolite of alprazolam.   Butalbital                     PRESENT      EXPECTED   Tramadol                       PRESENT      EXPECTED   O-Desmethyltramadol            PRESENT      EXPECTED   N-Desmethyltramadol            PRESENT      EXPECTED    Source of tramadol is a prescription medication.    O-desmethyltramadol and N-desmethyltramadol are expected    metabolites of tramadol.   Gabapentin                     PRESENT      EXPECTED   Cyclobenzaprine                PRESENT      EXPECTED   Citalopram  PRESENT      EXPECTED   Desmethylcitalopram            PRESENT      EXPECTED    Desmethylcitalopram is an expected metabolite of citalopram or    the enantiomeric form, escitalopram.   Trazodone                      PRESENT      EXPECTED   1,3 chlorophenyl piperazine    PRESENT      EXPECTED    1,3-chlorophenyl piperazine is an expected metabolite of    trazodone.   Acetaminophen                  PRESENT      EXPECTED   Ibuprofen                      PRESENT      EXPECTED   Atenolol                       PRESENT      EXPECTED Drug Present not Declared for Prescription Verification   Lorazepam                      602          UNEXPECTED ng/mg creat    Source of lorazepam is a scheduled prescription medication. Drug Absent but Declared for Prescription Verification   Zolpidem                       Not Detected UNEXPECTED    Zolpidem, as indicated in the declared medication list, is not    always detected even when used as directed.   Diclofenac                     Not Detected UNEXPECTED    Diclofenac, as indicated in the declared medication list, is not    always detected even when used as directed.   Lidocaine                      Not Detected UNEXPECTED     Lidocaine, as indicated in the declared medication list, is not    always detected even when used as directed.   Clonidine                      Not Detected UNEXPECTED ==================================================================== Test                      Result    Flag   Units      Ref Range   Creatinine              101              mg/dL      >=20 ==================================================================== Declared Medications:  The flagging and interpretation on this report are based on the  following declared medications.  Unexpected results may arise from  inaccuracies in the declared medications.  **Note: The testing scope of this panel includes these medications:  Alprazolam (Xanax)  Atenolol  Butalbital (Fioricet)  Citalopram (Lexapro)  Clonidine  Cyclobenzaprine (Flexeril)  Gabapentin  Tramadol (Ultram)  Trazodone (Desyrel)  **Note: The testing scope of this panel does not include small to  moderate amounts of these reported medications:  Acetaminophen (Fioricet)  Diclofenac (Voltaren)  Ibuprofen  Lidocaine (Lidoderm)  Zolpidem (Ambien)  **Note: The testing scope of this panel does not include following  reported medications:  Albuterol  Doxazosin (Cardura)  Eye Drops  Fluticasone (Flonase)  Hydralazine (Apresoline)  Hydrochlorothiazide  Insulin (Humulin)  Insulin (Levemir)  Levofloxacin (Levaquin)  Linaclotide (Linzess)  Lisinopril  Metformin  Metoclopramide (Reglan)  Ondansetron (Zofran)  Pantoprazole (Protonix)  Potassium  Pramipexole (Mirapex)  Torsemide (Demadex) ==================================================================== For clinical consultation, please call [phone]. ====================================================================    Risk Assessment Profile: Aberrant behavior: See initial evaluations. None observed or detected today Comorbid factors increasing risk of overdose: See initial evaluation. No  additional risks detected today Medical Psychology Evaluation: Not applicable.     Opioid Risk Tool - 08/11/17 1134      Family History of Substance Abuse   Alcohol Negative   Illegal Drugs Negative   Rx Drugs Negative     Personal History of Substance Abuse   Alcohol Negative   Illegal Drugs Negative   Rx Drugs Negative     Psychological Disease   Psychological Disease Positive   ADD Negative   OCD Negative   Bipolar Negative   Schizophrenia Negative   Depression Positive  patient takes lexapro for depression and feels that it helps her cope with negative circumstances.     Total Score   Opioid Risk Tool Scoring 3   Opioid Risk Interpretation Low Risk     ORT Scoring interpretation table:  Score <3 = Low Risk for SUD  Score between 4-7 = Moderate Risk for SUD  Score >8 = High Risk for Opioid Abuse   Risk Mitigation Strategies:  Patient opioid safety counseling:  Not applicable. Patient-Prescriber Agreement (PPA): No agreement signed.  Controlled substance notification to other providers: None required, patient is not receiving opioid therapy from this clinic.  Pharmacologic Plan: Continue medications as currently prescribed             Laboratory Chemistry  Inflammation Markers (CRP: Acute Phase) (ESR: Chronic Phase) Lab Results  Component Value Date   CRP 1.2 (H) 12/07/2016   ESRSEDRATE 56 (H) 12/07/2016                 Renal Function Markers Lab Results  Component Value Date   BUN 13 12/07/2016   CREATININE 0.66 12/07/2016   GFRAA >60 12/07/2016   GFRNONAA >60 12/07/2016                 Hepatic Function Markers Lab Results  Component Value Date   AST 34 12/07/2016   ALT 20 12/07/2016   ALBUMIN 3.7 12/07/2016   ALKPHOS 106 12/07/2016                 Electrolytes Lab Results  Component Value Date   NA 139 12/07/2016   K 3.7 12/07/2016   CL 103 12/07/2016   CALCIUM 9.9 12/07/2016   MG 1.8 12/07/2016                 Neuropathy Markers Lab  Results  Component Value Date   VITAMINB12 787 12/07/2016                 Bone Pathology Markers Lab Results  Component Value Date   ALKPHOS 106 12/07/2016   25OHVITD1 26 (L) 12/07/2016   25OHVITD2 1.2 12/07/2016   25OHVITD3 25 12/07/2016   CALCIUM 9.9 12/07/2016                 Coagulation Parameters  Lab Results  Component Value Date   INR 0.95 09/20/2016   LABPROT 12.7 09/20/2016   APTT 34.9 10/22/2014   PLT 202 07/13/2017                 Cardiovascular Markers Lab Results  Component Value Date   HGB 12.0 07/13/2017   HCT 36.0 07/13/2017                 Note: Lab results reviewed.  Recent Diagnostic Imaging Review   Results for orders placed in visit on 03/26/03  DG Cervical Spine 1 View   Narrative FINDINGS CLINICAL DATA:  C5-6 ACDF. C-ARM C-ARM FLUOROSCOPY WAS PROVIDED. IMPRESSION C-ARM FLUOROSCOPY WAS PROVIDED. PORTABLE LATERAL CERVICAL SPINE A SINGLE SOMEWHAT OBLIQUE VIEW SHOWS PERFORMANCE OF ANTERIOR CERVICAL DISKECTOMY AND FUSION AT C5- 6.  THE ANTERIOR PLATE APPEARS GROSSLY WELL POSITIONED.  DETAIL DOES NOT REALLY PERMIT EVALUATION OF THE INTERBODY ALLOGRAFT. IMPRESSION ACDF C5-6.   Cervical DG 2-3 views:  Results for orders placed in visit on 05/21/03  DG Cervical Spine 2-3 Views   Narrative FINDINGS CLINICAL DATA:  CERVICAL SPINE FUSION, MARCH OF 2004, NOW WITH PAIN. THREE VIEWS OF CERVICAL SPINE THREE VIEWS OF THE CERVICAL SPINE WERE OBTAINED, INCLUDING NEUTRAL LATERAL VIEW AND VIEWS IN FLEXION AND EXTENSION.  IN THE NEUTRAL LATERAL VIEW, ANTERIOR FUSION IS NOTED AT C5-6.  THE ANTERIOR METALLIC FUSION PLATE AND INTERBODY PLUG ARE IN GOOD POSITION, AND NORMAL HEIGHT IS MAINTAINED.  NORMAL ALIGNMENT IS PRESENT.  THROUGH FLEXION AND EXTENSION, THERE IS SLIGHTLY LIMITED MOTION, BUT NO MALALIGNMENT IS SEEN. NO SIGNIFICANT PREVERTEBRAL SOFT TISSUE SWELLING IS NOTED. IMPRESSION ANTERIOR FUSION AT C5-6.  SLIGHTLY LIMITED RANGE OF MOTION THROUGH  FLEXION AND EXTENSION BUT NO MALALIGNMENT.    Thoracic Imaging: Thoracic MR wo contrast:  Results for orders placed during the hospital encounter of 08/18/17  MR THORACIC SPINE WO CONTRAST   Narrative CLINICAL DATA:  Low back pain, RIGHT leg numbness with occasional pain. History of L4-5 laminectomy. Assess prior to placement of neurostimulator.  EXAM: MRI THORACIC WITHOUT CONTRAST  MRI LUMBAR SPINE WITHOUT AND WITH CONTRAST  TECHNIQUE: Multiplanar and multiecho pulse sequences of the thoracic spine were obtained without intravenous contrast.  Multiplanar and multiecho pulse sequences of the thoracic and lumbar spine were obtained without and with intravenous contrast.  CONTRAST:  82m MULTIHANCE GADOBENATE DIMEGLUMINE 529 MG/ML IV SOLN  COMPARISON:  CT chest, abdomen and pelvis May 25, 2017 and MRI of the lumbar spine December 31, 2016  FINDINGS: MRI THORACIC SPINE FINDINGS  ALIGNMENT: Maintenance of the thoracic kyphosis. No malalignment.  VERTEBRAE/DISCS: Status post C5-6 approximate ACDF, susceptibility artifact limits precise numbering. Vertebral bodies are intact. Mild disc desiccation all levels with mild chronic discogenic endplate changes. Scattered predominately bright T1 and bright STIR signal hemangioma.  CORD: Thoracic spinal cord is normal morphology and signal characteristics.  PREVERTEBRAL AND PARASPINAL SOFT TISSUES:  Nonacute.  DISC LEVELS:  T1-2 through T6-7: No disc bulge, canal stenosis nor neural foraminal narrowing.  T7-8: Small central disc protrusion without canal stenosis or neural foraminal narrowing.  T8-9 in T9-10: No disc bulge, canal stenosis nor neural foraminal narrowing.  T10-11: Annular bulging. No canal stenosis or neural foraminal narrowing.  T11-12 and T12-L1: No disc bulge, canal stenosis nor neural foraminal narrowing. Mild facet arthropathy.  MRI LUMBAR SPINE FINDINGS  SEGMENTATION: For the purposes of this report,  the last well-formed intervertebral disc will be reported as L5-S1.  ALIGNMENT: Maintained lumbar lordosis. Stable grade  1 L4-5 anterolisthesis.  VERTEBRAE:Vertebral bodies intact. L3 through L5 PLIF, hardware results in susceptibility artifact. Non surgically altered discs demonstrate normal morphology, slight desiccation. No abnormal or acute bone marrow signal. No abnormal osseous or disc enhancement.  CONUS MEDULLARIS: Conus medullaris terminates at T12-L1 and demonstrates normal morphology and signal characteristics. Cauda equina is normal. No abnormal cord, leptomeningeal or epidural enhancement.  PARASPINAL AND SOFT TISSUES: Severe paraspinal muscle atrophy at and below the level surgical intervention. Newly atrophic RIGHT iliopsoas muscle.  DISC LEVELS:  L1-2: No disc bulge, canal stenosis nor neural foraminal narrowing. Mild facet arthropathy.  L2-3: Small similar RIGHT subarticular disc protrusion. Mild facet arthropathy and ligamentum flavum redundancy. Mild canal stenosis. Minimal RIGHT neural foraminal narrowing.  L3-4: New PLIF and posterior decompression. New RIGHT lateral plate and screw fixation, the plate extends 2 cm laterally into the psoas muscle and may affect the exited RIGHT L3 nerve. Minimal canal stenosis. No residual synovial cyst. Mild neural foraminal narrowing.  L4-5: PLIF. Anterolisthesis. Posterior decompression. Mild neural foraminal narrowing may be overestimated by hardware artifact.  L5-S1: No disc bulge, canal stenosis nor neural foraminal narrowing. Moderate to severe facet arthropathy with trace effusions which are likely reactive. No canal stenosis. Mild LEFT neural foraminal narrowing may be overestimated by hardware artifact.  IMPRESSION: MRI thoracic spine:  1. Degenerative change without canal stenosis or neural foraminal narrowing. MRI lumbar spine:  1. Interval L3-4 PLIF. RIGHT lateral L3-4 plate and screw fixation  ; plate has backed out 2 cm into the iliopsoas muscle and may affect the exited RIGHT L3 nerve. New RIGHT iliopsoas muscle atrophy. 2. L4-5 PLIF, stable grade 1 L4-5 anterolisthesis. 3. Minimal canal stenosis L3-4, mild at L2-3. Minimal to mild L2-3 through L5-S1 neural foraminal narrowing.   Electronically Signed   By: Elon Alas M.D.   On: 08/18/2017 20:25     Lumbosacral Imaging: Lumbar MR wo contrast: No results found for this or any previous visit. Lumbar MR wo contrast:  Results for orders placed in visit on 04/24/09  [place] W/O Cm   Narrative  PRIOR REPORT IMPORTED FROM THE [facility] WORKFLOW SYSTEM   REASON FOR EXAM:    radiculopathy and scoliosis  COMMENTS:   PROCEDURE:     MR  - MR LUMBAR SPINE WO CONTRAST  - Apr 24 2009  5:20PM   RESULT:        There are no prior exams available for comparison.   The conus medullaris terminates at an L1 level.  The cauda equina  demonstrates no evidence of clumping or thickening.   At the T12-L1 level, L1-2 level and L2-3 level, there is no evidence of  thecal sac stenosis or neural foraminal narrowing.   At the L3-4 level, there is no evidence of thecal sac stenosis or neural  foraminal narrowing.   At the L4-5 level, a broad-based disc bulge is appreciated causing partial  effacement of the anterior CSF space. This contributes to multifactorial  moderate thecal sac stenosis and bilateral mild neural foraminal narrowing  right greater than left. The thecal sac stenosis is also secondary to  ligamentum flavum and facet hypertrophy.   At the L5-S1 level, there is no evidence of thecal sac stenosis or neural  foraminal narrowing.   IMPRESSION:   1.     Moderate thecal sac stenosis at the L4-5 level with mild bilateral  neural foraminal narrowing.  Mild exiting nerve root compromise  bilaterally  is a diagnostic consideration.  2.     No further evidence of focal or acute pathology.   Thank you for the  opportunity to contribute to the care of your patient.       Lumbar MR w/wo contrast:  Results for orders placed during the hospital encounter of 08/18/17  MR Lumbar Spine W Wo Contrast   Narrative CLINICAL DATA:  Low back pain, RIGHT leg numbness with occasional pain. History of L4-5 laminectomy. Assess prior to placement of neurostimulator.  EXAM: MRI THORACIC WITHOUT CONTRAST  MRI LUMBAR SPINE WITHOUT AND WITH CONTRAST  TECHNIQUE: Multiplanar and multiecho pulse sequences of the thoracic spine were obtained without intravenous contrast.  Multiplanar and multiecho pulse sequences of the thoracic and lumbar spine were obtained without and with intravenous contrast.  CONTRAST:  22m MULTIHANCE GADOBENATE DIMEGLUMINE 529 MG/ML IV SOLN  COMPARISON:  CT chest, abdomen and pelvis May 25, 2017 and MRI of the lumbar spine December 31, 2016  FINDINGS: MRI THORACIC SPINE FINDINGS  ALIGNMENT: Maintenance of the thoracic kyphosis. No malalignment.  VERTEBRAE/DISCS: Status post C5-6 approximate ACDF, susceptibility artifact limits precise numbering. Vertebral bodies are intact. Mild disc desiccation all levels with mild chronic discogenic endplate changes. Scattered predominately bright T1 and bright STIR signal hemangioma.  CORD: Thoracic spinal cord is normal morphology and signal characteristics.  PREVERTEBRAL AND PARASPINAL SOFT TISSUES:  Nonacute.  DISC LEVELS:  T1-2 through T6-7: No disc bulge, canal stenosis nor neural foraminal narrowing.  T7-8: Small central disc protrusion without canal stenosis or neural foraminal narrowing.  T8-9 in T9-10: No disc bulge, canal stenosis nor neural foraminal narrowing.  T10-11: Annular bulging. No canal stenosis or neural foraminal narrowing.  T11-12 and T12-L1: No disc bulge, canal stenosis nor neural foraminal narrowing. Mild facet arthropathy.  MRI LUMBAR SPINE FINDINGS  SEGMENTATION: For the purposes of this report, the  last well-formed intervertebral disc will be reported as L5-S1.  ALIGNMENT: Maintained lumbar lordosis. Stable grade 1 L4-5 anterolisthesis.  VERTEBRAE:Vertebral bodies intact. L3 through L5 PLIF, hardware results in susceptibility artifact. Non surgically altered discs demonstrate normal morphology, slight desiccation. No abnormal or acute bone marrow signal. No abnormal osseous or disc enhancement.  CONUS MEDULLARIS: Conus medullaris terminates at T12-L1 and demonstrates normal morphology and signal characteristics. Cauda equina is normal. No abnormal cord, leptomeningeal or epidural enhancement.  PARASPINAL AND SOFT TISSUES: Severe paraspinal muscle atrophy at and below the level surgical intervention. Newly atrophic RIGHT iliopsoas muscle.  DISC LEVELS:  L1-2: No disc bulge, canal stenosis nor neural foraminal narrowing. Mild facet arthropathy.  L2-3: Small similar RIGHT subarticular disc protrusion. Mild facet arthropathy and ligamentum flavum redundancy. Mild canal stenosis. Minimal RIGHT neural foraminal narrowing.  L3-4: New PLIF and posterior decompression. New RIGHT lateral plate and screw fixation, the plate extends 2 cm laterally into the psoas muscle and may affect the exited RIGHT L3 nerve. Minimal canal stenosis. No residual synovial cyst. Mild neural foraminal narrowing.  L4-5: PLIF. Anterolisthesis. Posterior decompression. Mild neural foraminal narrowing may be overestimated by hardware artifact.  L5-S1: No disc bulge, canal stenosis nor neural foraminal narrowing. Moderate to severe facet arthropathy with trace effusions which are likely reactive. No canal stenosis. Mild LEFT neural foraminal narrowing may be overestimated by hardware artifact.  IMPRESSION: MRI thoracic spine:  1. Degenerative change without canal stenosis or neural foraminal narrowing. MRI lumbar spine:  1. Interval L3-4 PLIF. RIGHT lateral L3-4 plate and screw fixation ; plate  has backed out 2 cm into the iliopsoas muscle and may affect the  exited RIGHT L3 nerve. New RIGHT iliopsoas muscle atrophy. 2. L4-5 PLIF, stable grade 1 L4-5 anterolisthesis. 3. Minimal canal stenosis L3-4, mild at L2-3. Minimal to mild L2-3 through L5-S1 neural foraminal narrowing.   Electronically Signed   By: Elon Alas M.D.   On: 08/18/2017 20:25    Lumbar CT wo contrast:  Results for orders placed during the hospital encounter of 02/18/17  CT LUMBAR SPINE WO CONTRAST   Narrative CLINICAL DATA:  Low back pain  EXAM: CT LUMBAR SPINE WITHOUT CONTRAST  TECHNIQUE: Multidetector CT imaging of the lumbar spine was performed without intravenous contrast administration. Multiplanar CT image reconstructions were also generated.  COMPARISON:  MRI 05/10/2017  FINDINGS: Segmentation: 5 lumbar type vertebrae.  Alignment: Mild levoscoliosis.  Fused L4-5 anterolisthesis.  Vertebrae: Remote L4 superior endplate fracture with right preferential height loss. The right L4 pedicle screw reaches the superior endplate. No acute fracture noted. No evidence of discitis or aggressive bone lesion.  Paraspinal and other soft tissues: Punctate left renal calculus. Atherosclerotic calcification. Colonic diverticulosis.  Disc levels:  T12- L1: Minimal spondylosis.  Mild facet spurring.  No impingement  L1-L2: Minimal spondylosis.  Mild facet spurring.  No impingement  L2-L3: Minimal spondylosis. Facet hypertrophy. Mild triangular narrowing of the thecal sac.  L3-L4: Severe facet arthropathy with hypertrophy and spurring. Disc narrowing and bulging. Advanced spinal stenosis. A least moderate bilateral foraminal stenosis.  L4-L5: PLIF with solid bony fusion. Posterior decompression. No residual impingement.  L5-S1:Facet arthropathy with moderate spurring. Spondylosis with ventral osteophyte. Patent spinal canal. Mild bilateral foraminal narrowing.  IMPRESSION: 1. L3-4 advanced  adjacent segment facet arthropathy with advanced spinal stenosis. Moderate biforaminal narrowing. 2. L4-5 PLIF with solid bony fusion. 3. Remote L4 superior endplate fracture. The right L4 pedicle screw tip is at the superior endplate.   Electronically Signed   By: Monte Fantasia M.D.   On: 02/18/2017 14:27    Lumbar CT w/wo contrast: No results found for this or any previous visit. Lumbar CT w/wo contrast: No results found for this or any previous visit. Lumbar CT w contrast: No results found for this or any previous visit. Lumbar DG 1V: No results found for this or any previous visit. Lumbar DG 1V (Clearing): No results found for this or any previous visit. Lumbar DG 2-3V (Clearing): No results found for this or any previous visit. Lumbar DG 2-3 views:  Results for orders placed during the hospital encounter of 05/23/09  DG Lumbar Spine 2-3 Views   Narrative Clinical Data: L4-5 laminectomy and fusion   LUMBAR SPINE - 2-3 VIEW   Comparison: None   Findings: Two C-arm images show posterior decompression with fusion at L4-5.  There are pedicle screws, posterior rods and interbody fusion material.  No radiographically detectable complication.   IMPRESSION: PLIF L4-5  Provider: Mila Palmer, Morrell Riddle      Knee Imaging: Knee-R MR w contrast: No results found for this or any previous visit. Knee-L MR w contrast:  Results for orders placed in visit on 06/08/06  MR Knee Left  Wo Contrast   Narrative   PRIOR REPORT IMPORTED FROM THE [employer] WORKFLOW SYSTEM   REASON FOR EXAM:   Bilateral knee pain  COMMENTS:   PROCEDURE:     MR  - MR KNEE LT  WO CONTRAST  - Jun 08 2006  9:37AM   RESULT:        The patient is complaining of knee discomfort.   Multiplanar images were obtained through the  knee.  There is a small  effusion.  There is a moderate size popliteal cyst measuring approximately  1.9 cm in diameter.  The fluid sensitive sequences reveal no evidence of  bone  marrow edema.  There is thinning of the articular cartilage of the  patella and the patella is displaced slightly laterally in the patellar  notch of the femur.  The medial and lateral collateral ligament complexes  are intact.  The ACL and PCL are intact.  The medial and lateral menisci  exhibit no evidence of a tear.  The quadriceps and patellar tendons are  intact.  There are small osteophytes noted off the superior and inferior  margins of the patella and there are small osteophytes with the femoral  condyles.   IMPRESSION:   1.     There is no evidence of a meniscal tear or cruciate ligament injury  or collateral ligament injury.  2.     There is a moderate size popliteal cyst and small joint effusion.  3.     There is no evidence of bone marrow edema.  4.     Mild degenerative osteophyte formation is seen in all three joint  compartments.  5.     There is mild thinning of the articular cartilage of the patella  and  slight lateral subluxation of the patella with respect to the underlying  femur.   Thank you for this opportunity to contribute to the care of your patient.        Knee-R MR wo contrast:  Results for orders placed in visit on 06/08/06  MR Knee Right Wo Contrast   Narrative  PRIOR REPORT IMPORTED FROM THE [employer] WORKFLOW SYSTEM   REASON FOR EXAM:  bilateral knee pain  COMMENTS:   PROCEDURE:     MR  - MR KNEE RT  WO  CONTRAST  - Jun 08 2006  9:37AM   RESULT:          Multiplanar images were obtained through the knee.  The  patient has a prior diagnosis of chondromalacia patella.   The quadriceps and patellar tendons are intact.  There is thinning of the  articular cartilage of the patella with a small amount of subchondral  increased signal seen best on image #5 of the axial sequences.  The  patella  appears to be somewhat laterally subluxed, and there is associated  degenerative change of the lateral aspect of the patellar facet.  Small  osteophytes are  noted on other marginal portions of the femoral condyles.   I  do not see evidence of acute bone marrow edema.  There is a moderate-sized  effusion.  No significant popliteal cyst is evident.  The medial and  lateral  collateral ligament complexes are intact.   There is increased signal in the posterior horn of the lateral meniscus  consistent with degenerative change and likely a tiny tear reaching the  inferior joint surface.  The ACL and PCL are intact.  There is some fluid  associated with the ACL, but I do not see objective evidence of a tear.   The  medial meniscus demonstrates very minimal increased intrasubstance signal  both anteriorly and posteriorly, but no frank tear is seen.   IMPRESSION:   1.     There are degenerative changes of the patellofemoral joint  consistent  with the known chondromalacia patella.  I do not see more than minimal  subchondral increased marrow signal.  Slight lateral subluxation of  the  patella with respect to the lateral femoral condyle is present.  2.     The cruciate and collateral ligaments are intact.  3.     There are mild degenerative-type signal changes in both menisci,  but  there is evidence of a tiny tear in the posterior horn of the lateral  meniscus.  4.     There is a small effusion.   Thank you for this opportunity to contribute to the care of your patient.        Knee-L DG 1-2 views:  Results for orders placed in visit on 09/24/13  DG Knee 1-2 Views Left   Narrative   PRIOR REPORT IMPORTED FROM THE [employer] WORKFLOW SYSTEM   REASON FOR EXAM:    postop  COMMENTS:   Bedside (portable):Y   PROCEDURE:     DXR - DXR KNEE LEFT AP AND LATERAL  - Sep 24 2013 11:28AM   RESULT:     AP and lateral portable views performed in the recovery room  reveal the patient has undergone left total knee joint prosthesis  placement.  Radiographic positioning of the prosthetic components is good. There are  surgical drain lines and skin  staples present.   IMPRESSION:      The patient has undergone left knee joint prosthesis  placement. Further interpretation is deferred to Dr. Marry Guan.   Dictation Site: 2        Note: Results of ordered imaging test(s) reviewed and explained to patient in Layman's terms. Copy of results provided to patient  Meds   Current Outpatient Prescriptions:  .  albuterol (VENTOLIN HFA) 108 (90 Base) MCG/ACT inhaler, TAKE 2 PUFFS BY MOUTH 3 TIMES A DAY AS NEEDED, Disp: , Rfl:  .  ALPRAZolam (XANAX) 0.5 MG tablet, Take 0.5 mg by mouth 5 (five) times daily., Disp: , Rfl:  .  butalbital-acetaminophen-caffeine (FIORICET, ESGIC) 50-325-40 MG tablet, TAKE ONE TABLET BY MOUTH 2 TIMES A DAY AS NEEDED, Disp: , Rfl:  .  carvedilol (COREG) 25 MG tablet, Take by mouth., Disp: , Rfl:  .  cloNIDine (CATAPRES - DOSED IN MG/24 HR) 0.2 mg/24hr patch, 0.3 patches. Frequency:1XW   Dosage:0.0     Instructions:  Note:Dose: 0.2MG/24H, Disp: , Rfl:  .  cyclobenzaprine (FLEXERIL) 10 MG tablet, Take 1 tablet (10 mg total) by mouth 3 (three) times daily as needed for muscle spasms., Disp: 270 tablet, Rfl: 0 .  doxazosin (CARDURA) 4 MG tablet, Take 4 mg by mouth daily. , Disp: , Rfl:  .  escitalopram (LEXAPRO) 10 MG tablet, Take 10 mg by mouth daily. , Disp: , Rfl:  .  fluticasone (FLONASE) 50 MCG/ACT nasal spray, PLACE 2 SPRAYS INTO BOTH NOSTRILS 2 (TWO) TIMES DAILY., Disp: , Rfl:  .  hydrALAZINE (APRESOLINE) 25 MG tablet, Take 25 mg by mouth daily. , Disp: , Rfl:  .  ibuprofen (ADVIL,MOTRIN) 800 MG tablet, Take 800 mg by mouth 3 (three) times daily. , Disp: , Rfl:  .  Insulin Detemir (LEVEMIR FLEXTOUCH) 100 UNIT/ML Pen, INJECT 90 UNITS SUBCUTANEOUSLY TWICE A DAY, Disp: , Rfl:  .  Insulin Pen Needle (FIFTY50 PEN NEEDLES) 32G X 6 MM MISC, 5 (five) times daily., Disp: , Rfl:  .  insulin regular human CONCENTRATED (HUMULIN R) 500 UNIT/ML kwikpen, Inject 70 Units into the skin 3 (three) times daily with meals. , Disp: , Rfl:  .   lidocaine (LIDODERM) 5 %, PLACE 1 PATCH ONTO THE MOST PAINFUL AREA OF SKIN DAILY  FOR UP TO 12 HOURS IN A 24 HOUR PERIOD, Disp: , Rfl: 5 .  lisinopril (PRINIVIL,ZESTRIL) 40 MG tablet, TAKE 1 TABLET (40 MG TOTAL) BY MOUTH ONCE DAILY., Disp: , Rfl: 3 .  metoCLOPramide (REGLAN) 10 MG tablet, Take 10 mg by mouth 3 (three) times daily., Disp: , Rfl:  .  ondansetron (ZOFRAN) 4 MG tablet, Take 4 mg by mouth every 8 (eight) hours as needed for nausea. , Disp: , Rfl:  .  pantoprazole (PROTONIX) 40 MG tablet, TAKE 1 TABLET (40 MG TOTAL) BY MOUTH 2 (TWO) TIMES DAILY. TAKE ONE HOUR BEFORE A MEAL, Disp: , Rfl:  .  potassium chloride (K-DUR,KLOR-CON) 10 MEQ tablet, TAKE 1 TABLET BY MOUTH ONCE A DAY, Disp: , Rfl:  .  pramipexole (MIRAPEX) 0.5 MG tablet, TAKE 1 TABLET (0.5 MG TOTAL) BY MOUTH 2 (TWO) TIMES DAILY., Disp: , Rfl:  .  traZODone (DESYREL) 150 MG tablet, Take 150 mg by mouth at bedtime. , Disp: , Rfl:  .  zolpidem (AMBIEN) 10 MG tablet, Take 10 mg by mouth at bedtime. , Disp: , Rfl:  .  bumetanide (BUMEX) 1 MG tablet, Take by mouth., Disp: , Rfl:  .  cephALEXin (KEFLEX) 500 MG capsule, TAKE 1 CAPSULE (500 MG TOTAL) BY MOUTH 4 (FOUR) TIMES DAILY FOR 14 DAYS., Disp: , Rfl: 0 .  diclofenac sodium (VOLTAREN) 1 % GEL, Apply topically as needed. , Disp: , Rfl:  .  gabapentin (NEURONTIN) 600 MG tablet, Take 1 tablet (600 mg total) by mouth 6 (six) times daily. (Patient taking differently: Take 600 mg by mouth 5 (five) times daily. ), Disp: 540 tablet, Rfl: 0 .  hydrocortisone 2.5 % cream, APPLY TOPICALLY 2 (TWO) TIMES DAILY FOR 10 DAYS., Disp: , Rfl:  .  mometasone (ELOCON) 0.1 % lotion, APPLY TWICE A DAY as needed, Disp: , Rfl:  .  traMADol (ULTRAM) 50 MG tablet, Take 2 tablets (100 mg total) by mouth every 8 (eight) hours., Disp: 180 tablet, Rfl: 2 .  triamcinolone ointment (KENALOG) 0.1 %, APPLY TO AFFECTED AREA AT BEDTIME, Disp: , Rfl:   ROS  Constitutional: Denies any fever or chills Gastrointestinal: No  reported hemesis, hematochezia, vomiting, or acute GI distress Musculoskeletal: Denies any acute onset joint swelling, redness, loss of ROM, or weakness Neurological: No reported episodes of acute onset apraxia, aphasia, dysarthria, agnosia, amnesia, paralysis, loss of coordination, or loss of consciousness  Allergies  Ms. Bomkamp is allergic to amlodipine; imdur [isosorbide dinitrate]; lyrica [pregabalin]; monosodium glutamate; shellfish allergy; tape; and eggs or egg-derived products.  Pablo Pena  Drug: Ms. Piccirilli  reports that she does not use drugs. Alcohol:  reports that she does not drink alcohol. Tobacco:  reports that she has never smoked. She has never used smokeless tobacco. Medical:  has a past medical history of Abdominal pain, left upper quadrant (12/11/2012); Arthritis; Asthma; Cancer ([place]); Collagen vascular disease ([place]); Diabetes mellitus without complication ([place]); Diabetic nephropathy associated with secondary diabetes mellitus ([place]); Flushing (12/11/2012); Gastroparesis; Gross hematuria (12/11/2012); Hepatic cirrhosis ([place]); Hyperlipidemia; Hypertension; Kidney stone (12/11/2012); Morbid obesity with BMI of 40.0-44.9, adult ([place]); Nausea without vomiting (12/11/2012); Nephrolithiasis; Nephrolithiasis (04/26/2014); Nonproliferative retinopathy due to secondary diabetes ([place]); Peripheral vascular disease ([place]); Renal colic (82/42/3536); Sciatica; Sciatica (12/11/2012); Skin cancer; Sleep apnea; and Unilateral small kidney without contralateral hypertrophy. Surgical: Ms. Kibby  has a past surgical history that includes Cesarean section (1980); Dilation and curettage of uterus; Eye surgery (1986); Abdominal hysterectomy; Joint replacement (Left, 09/24/2013); Back surgery (04/1999); Tonsillectomy; Esophagogastroduodenoscopy (  02/08/2014); Colonoscopy (05/04/2001); Esophagogastroduodenoscopy (egd) with propofol (N/A, 09/20/2016); Colonoscopy with propofol (N/A, 09/20/2016); and Mohs surgery  (11/2016). Family: family history includes Asthma in her mother; Cancer in her mother; Diabetes in her father; Heart disease in her mother; Hypertension in her father; Kidney disease in her father.  Constitutional Exam  General appearance: Well nourished, well developed, and well hydrated. In no apparent acute distress Vitals:   09/14/17 1419  BP: (!) 174/64  Pulse: 68  Resp: 18  SpO2: 95%  Weight: 272 lb (123.4 kg)  Height: _0  (1.676 m)   BMI Assessment: Estimated body mass index is 43.9 kg/m as calculated from the following:   Height as of this encounter: _1  (1.676 m).   Weight as of this encounter: 272 lb (123.4 kg).  BMI interpretation table: BMI level Category Range association with higher incidence of chronic pain  <18 kg/m2 Underweight   18.5-24.9 kg/m2 Ideal body weight   25-29.9 kg/m2 Overweight Increased incidence by 20%  30-34.9 kg/m2 Obese (Class I) Increased incidence by 68%  35-39.9 kg/m2 Severe obesity (Class II) Increased incidence by 136%  >40 kg/m2 Extreme obesity (Class III) Increased incidence by 254%   BMI Readings from Last 4 Encounters:  09/14/17 43.90 kg/m  08/11/17 44.06 kg/m  07/13/17 44.52 kg/m  05/11/17 43.58 kg/m   Wt Readings from Last 4 Encounters:  09/14/17 272 lb (123.4 kg)  08/11/17 273 lb (123.8 kg)  07/13/17 275 lb 12.8 oz (125.1 kg)  05/11/17 270 lb (122.5 kg)  Psych/Mental status: Alert, oriented x 3 (person, place, & time)       Eyes: PERLA Respiratory: No evidence of acute respiratory distress  Cervical Spine Area Exam  Skin & Axial Inspection: No masses, redness, edema, swelling, or associated skin lesions Alignment: Symmetrical Functional ROM: Unrestricted ROM      Stability: No instability detected Muscle Tone/Strength: Functionally intact. No obvious neuro-muscular anomalies detected. Sensory (Neurological): Unimpaired Palpation: No palpable anomalies              Upper Extremity (UE) Exam    Side: Right upper  extremity  Side: Left upper extremity  Skin & Extremity Inspection: Skin color, temperature, and hair growth are WNL. No peripheral edema or cyanosis. No masses, redness, swelling, asymmetry, or associated skin lesions. No contractures.  Skin & Extremity Inspection: Skin color, temperature, and hair growth are WNL. No peripheral edema or cyanosis. No masses, redness, swelling, asymmetry, or associated skin lesions. No contractures.  Functional ROM: Unrestricted ROM          Functional ROM: Unrestricted ROM          Muscle Tone/Strength: Functionally intact. No obvious neuro-muscular anomalies detected.  Muscle Tone/Strength: Functionally intact. No obvious neuro-muscular anomalies detected.  Sensory (Neurological): Unimpaired          Sensory (Neurological): Unimpaired          Palpation: No palpable anomalies              Palpation: No palpable anomalies              Specialized Test(s): Deferred         Specialized Test(s): Deferred          Thoracic Spine Area Exam  Skin & Axial Inspection: No masses, redness, or swelling Alignment: Symmetrical Functional ROM: Unrestricted ROM Stability: No instability detected Muscle Tone/Strength: Functionally intact. No obvious neuro-muscular anomalies detected. Sensory (Neurological): Unimpaired Muscle strength & Tone: No palpable anomalies Lumbar Spine Area Exam  Skin & Axial Inspection: No masses, redness, or swelling Alignment: Symmetrical Functional ROM: Decreased ROM      Stability: No instability detected Muscle Tone/Strength: Functionally intact. No obvious neuro-muscular anomalies detected. Sensory (Neurological): Movement-associated pain Palpation: Complains of area being tender to palpation       Provocative Tests: Lumbar Hyperextension and rotation test: Positive       Lumbar Lateral bending test: Negative       Patrick's Maneuver: evaluation deferred today                    Gait & Posture Assessment  Ambulation: Patient ambulates  using a walker Gait: Very limited, using assistive device to ambulate Posture: Difficulty standing up straight, due to pain   Lower Extremity Exam    Side: Right lower extremity  Side: Left lower extremity  Skin & Extremity Inspection: Atrophy, obese   Skin & Extremity Inspection: Skin color, temperature, and hair growth are WNL. No peripheral edema or cyanosis. No masses, redness, swelling, asymmetry, or associated skin lesions. No contractures.  Functional ROM: Diminished ROM          Functional ROM: Unrestricted ROM          Muscle Tone/Strength: Right lower extremity 3-/5 hip flexion, 4-/5 knee extension, knee flexion, 4/5 plantar flexion dorsiflexion  Muscle Tone/Strength: Functionally intact. No obvious neuro-muscular anomalies detected.  Sensory (Neurological): Neuropathic And dermatomal pain pattern  Sensory (Neurological): Unimpaired  Palpation: Tender  Palpation: No palpable anomalies    Assessment & Plan  Primary Diagnosis & Pertinent Problem List: The primary encounter diagnosis was Failed back surgical syndrome. Diagnoses of L3-4 severe lumbar spinal stenosis (12/31/2016 MRI), L3-4 severe lumbar facet hypertrophy and spinal stenosis, Chronic lower extremity pain (Location of Secondary source of pain) (Bilateral) (R>L), Spinal stenosis of thoracic region, and Status post lumbar spine surgery for decompression of spinal cord were also pertinent to this visit.  Visit Diagnosis: 1. Failed back surgical syndrome   2. L3-4 severe lumbar spinal stenosis (12/31/2016 MRI)   3. L3-4 severe lumbar facet hypertrophy and spinal stenosis   4. Chronic lower extremity pain (Location of Secondary source of pain) (Bilateral) (R>L)   5. Spinal stenosis of thoracic region   6. Status post lumbar spine surgery for decompression of spinal cord     66 year old female with a past medical history of MI, type 2 diabetes on insulin last A1c 7.6, obesity who presents as a referral from Dr.  Cari Caraway for low back and right leg pain status post lumbar spine surgery 2 with her most recent one being March 2018. Patient endorses severe pain in her back and in her right leg. Rates axial low back pain is 75%, right leg pain is 25%. She notes severe numbness in her right thigh which does not extend past the knee. She has significant motor weakness on exam including hip flexors. Her EMG studies show a L2 radiculopathy along with a generalized sensorimotor polyneuropathy. This is consistent with her physical exam given weakness to hip flexion which is innervated by the L2 and L3 nerve roots via the femoral nerve.  Patient was initially seen on 08/11/2017 for initial discussion of spinal cord stimulator trial. Thoracic MRI was obtained which was negative for thoracic canal stenosis. Patient also saw Dr. Olive Bass at [facility] pain psychology regarding suitability of SCS trial and implant. She is deemed to be a good candidate for spinal cord stimulator trial. She will continue to see pain psychology at [facility] to work  on pain coping issues.  I had an extensive discussion with the patient about the potential risks and benefits of spinal cord stimulation. Patient does not have any caregivers in the house and performs her ADLs on her own. She states that after her previous surgeries, she has recovered on her own which is been difficult for her but she has managed. She is very eager to try and find something for her pain relief and weakness in her right leg.   I also informed the patient that she is at a greater risk of infection given her insulin-dependent diabetes and A1c greater than 6.5. For this reason I will plan on giving her antibiotics during her spinal cord stimulator trial along with intra-procedure antibiotics. I will also give her chlorhexidine scrub to use the night before in the morning of her procedure.  Patient also has morbid obesity which also increases her risk of procedural complications including  nerve injury, spinal cord injury, dural puncture headache. Patient is aware of this. I also informed the patient that she must limit her bending, lifting, twisting while the spinal cord stimulator trial leads are in place to minimize the risk of lead migration.  Plan: -Plan for [facility] spinal cord stimulator trial- either October 1 or October 15. Patient will let us know within the next 2 days which date will work better for her. For now we will schedule for October 1 tentatively. -I will call the patient next week to ensure that she does not have any additional questions questions or concerns about the procedure. -Chlorhexidine scrubbing solution given to patient to apply night before and morning of procedure. -Plan for intraprocedure cefazolin along with postprocedure Keflex 500 mg 4 times a day 7 days  Plan of Care   Lab-work, procedure(s), and/or referral(s): Orders Placed This Encounter  Procedures  . [place] TRIAL    Provider-requested follow-up: Return for Procedure.  Future Appointments Date Time Provider [place]  09/30/2017 11:30 AM Wilhelmina Mcardle, MD [facility] None  10/12/2017 1:45 PM [facility] None  10/12/2017 2:00 PM Lloyd Huger, MD [facility] None  10/12/2017 2:15 PM [facility] INFUSION CHAIR 11 [facility] None    Primary Care Physician: Rusty Aus, MD Location: [facility] Note by: [name], M.D Date: 09/14/2017; Time: 3:51 PM  Patient Instructions  It was nice seeing you today Miss Estorga. Thank you for seeing Dr. Olive Bass at [facility]  We had an extensive discussion about spinal cord stimulator trial and you would like to proceed with this. We discussed the risks and benefits of this procedure. All questions were answered. I will call you next week to confirm that you do not have any additional questions regarding the procedure.  We will provide you with a cleaning solution/scrub that I would like for you to  use the night before and in the morning of your procedure.  We will plan on doing the spinal cord stimulator trial with Nevro. Plan for 7 day trial.  We will tentatively schedule a trial for October 1 at 8:30 AM. If you are unable to do it then, we will have October 15 as a backup date. Please call us within the next 2 days and let us know which day you would like to go with but for now I will schedule you for October 1.  Please bring driver that day.

## 2017-09-15 ENCOUNTER — Telehealth: Payer: Self-pay | Admitting: Student in an Organized Health Care Education/Training Program

## 2017-09-15 NOTE — Telephone Encounter (Signed)
OK'd per Dr Holley Raring.  Please call and reschedule patient for the 15th at 0830. Please block off 830 -1130.   Thank you

## 2017-09-15 NOTE — Telephone Encounter (Signed)
Patient is unable to come on Oct 1st for SCS Trial She can do Oct 15th if possible.

## 2017-09-15 NOTE — Telephone Encounter (Signed)
Forwarded to Dr Holley Raring

## 2017-09-20 ENCOUNTER — Telehealth: Payer: Self-pay | Admitting: *Deleted

## 2017-09-20 ENCOUNTER — Telehealth: Payer: Self-pay | Admitting: Student in an Organized Health Care Education/Training Program

## 2017-09-20 NOTE — Telephone Encounter (Signed)
Attempted to call patient, message left. 

## 2017-09-20 NOTE — Telephone Encounter (Signed)
Attempted to call patient at approximately 2:40 PM today to discuss any follow-up questions the patient may have about her tentative spinal cord stimulator trial on Monday, October 15. Tried patient's house and cell phone number and went to VM on both. Did leave VM. Instructed patient to call our clinic if she had any pressing questions or concerns.

## 2017-09-26 NOTE — Telephone Encounter (Signed)
Patient was called and could not remember what she wanted to ask [place]. Instructed to call us back if she remembered what she wanted to ask.

## 2017-09-30 ENCOUNTER — Encounter: Payer: Self-pay | Admitting: Pulmonary Disease

## 2017-09-30 ENCOUNTER — Ambulatory Visit (INDEPENDENT_AMBULATORY_CARE_PROVIDER_SITE_OTHER): Payer: Medicare Other | Admitting: Pulmonary Disease

## 2017-09-30 VITALS — BP 142/78 | HR 71 | Ht 66.0 in | Wt 282.0 lb

## 2017-09-30 DIAGNOSIS — R600 Localized edema: Secondary | ICD-10-CM | POA: Diagnosis not present

## 2017-09-30 DIAGNOSIS — R053 Chronic cough: Secondary | ICD-10-CM

## 2017-09-30 DIAGNOSIS — R05 Cough: Secondary | ICD-10-CM | POA: Diagnosis not present

## 2017-09-30 DIAGNOSIS — G4719 Other hypersomnia: Secondary | ICD-10-CM | POA: Diagnosis not present

## 2017-09-30 DIAGNOSIS — J42 Unspecified chronic bronchitis: Secondary | ICD-10-CM | POA: Diagnosis not present

## 2017-09-30 DIAGNOSIS — G4733 Obstructive sleep apnea (adult) (pediatric): Secondary | ICD-10-CM

## 2017-09-30 DIAGNOSIS — R0609 Other forms of dyspnea: Secondary | ICD-10-CM | POA: Diagnosis not present

## 2017-09-30 DIAGNOSIS — E66813 Obesity, class 3: Secondary | ICD-10-CM

## 2017-09-30 DIAGNOSIS — Z6841 Body Mass Index (BMI) 40.0 and over, adult: Secondary | ICD-10-CM

## 2017-09-30 DIAGNOSIS — R0600 Dyspnea, unspecified: Secondary | ICD-10-CM

## 2017-09-30 MED ORDER — DOXYCYCLINE HYCLATE 100 MG PO TABS: 100.0000 mg | ORAL_TABLET | Freq: Two times a day (BID) | ORAL | 0 refills | Status: AC

## 2017-09-30 MED ORDER — CANDESARTAN CILEXETIL 32 MG PO TABS: 32.0000 mg | ORAL_TABLET | Freq: Every day | ORAL | 11 refills | Status: DC

## 2017-09-30 NOTE — Patient Instructions (Addendum)
Stop lisinopril Candesartan 32 mg po daily in place of lisinopril Doxycycline 100 mg twice a day for 10 days Lung function tests (PFTs) Split night sleep study ordered Follow up in 6-8 weeks

## 2017-10-02 NOTE — Progress Notes (Signed)
[place]  [phone] [phone]  ID: Brittney [name] OB: [birth year]  MR#: [id number]  BPZ#:[id number]  Patient Care Team: Rusty Aus, MD as PCP - General (Internal Medicine)  CHIEF COMPLAINT: Iron deficiency anemia.  INTERVAL HISTORY: Patient returns to clinic today for repeat laboratory work and further evaluation. She continues to have chronic weakness and fatigue, but otherwise feels well. She has no neurologic complaints. She denies any recent fevers or illnesses. She has a good appetite and denies weight loss. She denies any chest pain or shortness of breath. She has no nausea, vomiting, constipation, or diarrhea. She denies any melena or hematochezia. She has no urinary complaints. Patient otherwise feels well and offers no further specific complaints.  REVIEW OF SYSTEMS:   Review of Systems  Constitutional: Positive for malaise/fatigue. Negative for fever and weight loss.  Respiratory: Negative.  Negative for cough, hemoptysis and shortness of breath.   Cardiovascular: Negative.  Negative for chest pain and leg swelling.  Gastrointestinal: Negative.  Negative for abdominal pain, blood in stool and melena.  Genitourinary: Negative.   Musculoskeletal: Positive for back pain.  Skin: Negative.  Negative for rash.  Neurological: Positive for weakness.  Psychiatric/Behavioral: Negative.  The patient is not nervous/anxious.     As per HPI. Otherwise, a complete review of systems is negative.  PAST MEDICAL HISTORY: Past Medical History:  Diagnosis Date  . Abdominal pain, left upper quadrant 12/11/2012  . Arthritis   . Asthma   . Cancer ([place])    melanoma  . Collagen vascular disease ([place])   . Diabetes mellitus without complication ([place])   . Diabetic nephropathy associated with secondary diabetes mellitus ([place])   . Flushing 12/11/2012  . Gastroparesis   . Gross hematuria 12/11/2012  . Hepatic cirrhosis ([place])   . Hyperlipidemia   .  Hypertension   . Kidney stone 12/11/2012  . Morbid obesity with BMI of 40.0-44.9, adult ([place])   . Nausea without vomiting 12/11/2012  . Nephrolithiasis   . Nephrolithiasis 04/26/2014  . Nonproliferative retinopathy due to secondary diabetes ([place])   . Peripheral vascular disease ([place])   . Renal colic 24/23/5361  . Sciatica   . Sciatica 12/11/2012  . Skin cancer   . Sleep apnea   . Unilateral small kidney without contralateral hypertrophy     PAST SURGICAL HISTORY: Past Surgical History:  Procedure Laterality Date  . ABDOMINAL HYSTERECTOMY    . BACK SURGERY  04/1999   L-4-5 LAMINECTOMY  . CESAREAN SECTION  1980  . COLONOSCOPY  05/04/2001  . COLONOSCOPY WITH PROPOFOL N/A 09/20/2016   Procedure: COLONOSCOPY WITH PROPOFOL;  Surgeon: Lollie Sails, MD;  Location: [facility] ENDOSCOPY;  Service: Endoscopy;  Laterality: N/A;  . DILATION AND CURETTAGE OF UTERUS    . ESOPHAGOGASTRODUODENOSCOPY  02/08/2014  . ESOPHAGOGASTRODUODENOSCOPY (EGD) WITH PROPOFOL N/A 09/20/2016   Procedure: ESOPHAGOGASTRODUODENOSCOPY (EGD) WITH PROPOFOL;  Surgeon: Lollie Sails, MD;  Location: [facility] ENDOSCOPY;  Service: Endoscopy;  Laterality: N/A;  . EYE SURGERY  1986   FOR MELANOMA  . JOINT REPLACEMENT Left 09/24/2013   TOTAL KNEE  . MOHS SURGERY  11/2016   left side of nose  . TONSILLECTOMY     WITH ADENOIDECTOMY    FAMILY HISTORY: Family History  Problem Relation Age of Onset  . Heart disease Mother   . Cancer Mother   . Asthma Mother   . Diabetes Father   . Kidney disease Father   . Hypertension Father   . Breast  cancer Neg Hx     ADVANCED DIRECTIVES (Y/N):  N  HEALTH MAINTENANCE: Social History  Substance Use Topics  . Smoking status: Never Smoker  . Smokeless tobacco: Never Used  . Alcohol use No     Colonoscopy:  PAP:  Bone density:  Lipid panel:  Allergies  Allergen Reactions  . Amlodipine Cough  . Imdur [Isosorbide Dinitrate] Other (See Comments)    Headache  . Lyrica  [Pregabalin] Other (See Comments) and Swelling    "FLU-LIKE" SYMPTOMS  . Monosodium Glutamate Other (See Comments)    Headache  . Shellfish Allergy Nausea And Vomiting  . Tape Other (See Comments)    can use paper tape  . Eggs Or Egg-Derived Products Nausea And Vomiting    Current Outpatient Prescriptions  Medication Sig Dispense Refill  . ALPRAZolam (XANAX) 0.5 MG tablet Take 0.5 mg by mouth.     . bumetanide (BUMEX) 1 MG tablet Take by mouth.    . butalbital-acetaminophen-caffeine (FIORICET, ESGIC) 50-325-40 MG tablet TAKE ONE TABLET BY MOUTH 2 TIMES A DAY AS NEEDED    . carvedilol (COREG) 25 MG tablet Take by mouth.    . cloNIDine (CATAPRES - DOSED IN MG/24 HR) 0.2 mg/24hr patch 0.3 patches. Frequency:1XW   Dosage:0.0     Instructions:  Note:Dose: 0.2MG /24H    . doxazosin (CARDURA) 4 MG tablet Take 4 mg by mouth daily.     Marland Kitchen doxycycline (VIBRA-TABS) 100 MG tablet Take 1 tablet (100 mg total) by mouth 2 (two) times daily. 20 tablet 0  . escitalopram (LEXAPRO) 10 MG tablet Take 10 mg by mouth daily.     . fluticasone (FLONASE) 50 MCG/ACT nasal spray PLACE 2 SPRAYS INTO BOTH NOSTRILS 2 (TWO) TIMES DAILY.    . hydrALAZINE (APRESOLINE) 25 MG tablet Take 25 mg by mouth daily.     Marland Kitchen ibuprofen (ADVIL,MOTRIN) 800 MG tablet Take 800 mg by mouth 3 (three) times daily.     . Insulin Detemir (LEVEMIR FLEXTOUCH) 100 UNIT/ML Pen INJECT 90 UNITS SUBCUTANEOUSLY TWICE A DAY    . Insulin Pen Needle (FIFTY50 PEN NEEDLES) 32G X 6 MM MISC 5 (five) times daily.    . insulin regular human CONCENTRATED (HUMULIN R) 500 UNIT/ML kwikpen Inject 70 Units into the skin 3 (three) times daily with meals.     . lidocaine (LIDODERM) 5 % PLACE 1 PATCH ONTO THE MOST PAINFUL AREA OF SKIN DAILY FOR UP TO 12 HOURS IN A 24 HOUR PERIOD  5  . metoCLOPramide (REGLAN) 10 MG tablet Take 10 mg by mouth 3 (three) times daily.    . ondansetron (ZOFRAN) 4 MG tablet Take 4 mg by mouth every 8 (eight) hours as needed for nausea.     .  pantoprazole (PROTONIX) 40 MG tablet TAKE 1 TABLET (40 MG TOTAL) BY MOUTH 2 (TWO) TIMES DAILY. TAKE ONE HOUR BEFORE A MEAL    . potassium chloride (K-DUR,KLOR-CON) 10 MEQ tablet TAKE 1 TABLET BY MOUTH ONCE A DAY    . pramipexole (MIRAPEX) 0.5 MG tablet TAKE 1 TABLET (0.5 MG TOTAL) BY MOUTH 2 (TWO) TIMES DAILY.    . traZODone (DESYREL) 150 MG tablet Take 150 mg by mouth at bedtime.     . triamcinolone ointment (KENALOG) 0.1 % APPLY TO AFFECTED AREA AT BEDTIME    . zolpidem (AMBIEN) 10 MG tablet Take 10 mg by mouth at bedtime.     Marland Kitchen albuterol (VENTOLIN HFA) 108 (90 Base) MCG/ACT inhaler TAKE 2 PUFFS BY MOUTH 3  TIMES A DAY AS NEEDED    . candesartan (ATACAND) 32 MG tablet Take 1 tablet (32 mg total) by mouth daily. (Patient not taking: Reported on 10/05/2017) 30 tablet 11  . cephALEXin (KEFLEX) 500 MG capsule TAKE 1 CAPSULE (500 MG TOTAL) BY MOUTH 4 (FOUR) TIMES DAILY FOR 14 DAYS.  0  . cyclobenzaprine (FLEXERIL) 10 MG tablet Take 1 tablet (10 mg total) by mouth 3 (three) times daily as needed for muscle spasms. 270 tablet 0  . diclofenac sodium (VOLTAREN) 1 % GEL Apply topically as needed.     . gabapentin (NEURONTIN) 600 MG tablet Take 1 tablet (600 mg total) by mouth 6 (six) times daily. (Patient taking differently: Take 600 mg by mouth 5 (five) times daily. ) 540 tablet 0  . hydrocortisone 2.5 % cream APPLY TOPICALLY 2 (TWO) TIMES DAILY FOR 10 DAYS.    Marland Kitchen traMADol (ULTRAM) 50 MG tablet Take 2 tablets (100 mg total) by mouth every 8 (eight) hours. 180 tablet 2   No current facility-administered medications for this visit.     OBJECTIVE: Vitals:   10/05/17 1201  BP: (!) 176/81  Pulse: 61  Resp: 20  Temp: 98.9 F (37.2 C)     Body mass index is 45.5 kg/m.    ECOG FS:0 - Asymptomatic  General: Well-developed, well-nourished, no acute distress. Eyes: Pink conjunctiva, anicteric sclera. Lungs: Clear to auscultation bilaterally. Heart: Regular rate and rhythm. No rubs, murmurs, or  gallops. Abdomen: Soft, nontender, nondistended. No organomegaly noted, normoactive bowel sounds. Musculoskeletal: No edema, cyanosis, or clubbing. Neuro: Alert, answering all questions appropriately. Cranial nerves grossly intact. Skin: No rashes or petechiae noted. Psych: Normal affect.   LAB RESULTS:  Lab Results  Component Value Date   NA 139 12/07/2016   K 3.7 12/07/2016   CL 103 12/07/2016   CO2 28 12/07/2016   GLUCOSE 43 (LL) 12/07/2016   BUN 13 12/07/2016   CREATININE 0.66 12/07/2016   CALCIUM 9.9 12/07/2016   PROT 7.6 12/07/2016   ALBUMIN 3.7 12/07/2016   AST 34 12/07/2016   ALT 20 12/07/2016   ALKPHOS 106 12/07/2016   BILITOT 0.3 12/07/2016   GFRNONAA >60 12/07/2016   GFRAA >60 12/07/2016    Lab Results  Component Value Date   WBC 7.1 10/05/2017   NEUTROABS 4.0 10/05/2017   HGB 12.3 10/05/2017   HCT 37.7 10/05/2017   MCV 88.3 10/05/2017   PLT 188 10/05/2017   Lab Results  Component Value Date   IRON 48 10/05/2017   TIBC 251 10/05/2017   IRONPCTSAT 19 10/05/2017   Lab Results  Component Value Date   FERRITIN 96 10/05/2017     STUDIES: No results found.  ASSESSMENT: Iron deficiency anemia.  PLAN:    1. Iron deficiency anemia: Patient's hemoglobin and iron stores continue to be within normal limits. Previously, the remainder of her laboratory work was also either negative or within normal limits. She does not require additional Feraheme today. Patient last received treatment on July 13, 2017. Colonoscopy and EGD completed on September 20, 2016 were essentially normal other than some mild gastritis. Return to clinic in 4 months for repeat laboratory work and further evaluation.  2. Hypertension: Patient's blood pressure is significantly elevated today. Continue evaluation and treatment per primary care. 3. Back pain: Patient is having a spinal stimulator placed in the near future.  Approximately 20 minutes was spent in discussion of which greater  than 50% was consultation.  Patient expressed understanding and was in  agreement with this plan. She also understands that She can call clinic at any time with any questions, concerns, or complaints.    Lloyd Huger, MD   10/08/2017 8:06 AM

## 2017-10-03 NOTE — Progress Notes (Signed)
PULMONARY CONSULT NOTE  Requesting MD/Service: Emily Filbert, MD Date of initial consultation: 09/30/17 Reason for consultation: Chronic cough  PT PROFILE: 66 y.o. female never smoker previously see by Dr Ileene Musa and Dr Raul Del referred for eval of chronic cough of 4 yrs duration  DATA: 05/25/17 CT chest: Normal lung parenchyma. 3 mm posterior segment right upper lobe nodule, stable from 10/15/2014 and therefore benign. 3 mm left lower lobe nodular density, likely mucoid impaction   HPI:  As above, this is a 66 year old woman who has never smoked. She presents for evaluation of chronic cough of 4 years duration. She knows that her cough has been especially severe since February 2016 she was diagnosed with an MRSA lung infection. Her cough is worse in the evenings and is associated with shortness of breath. In general, she has class III SOB. Her cough is productive of thick green mucus. There is little day-to-day variation in her symptoms. There is no significant seasonal variation in her symptoms. 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 She is uncertain whether she has ever undergone PFTs. Notably, she is on lisinopril. She is uncertain whether her cough predates the initiation of lisinopril.  She also has a history of obstructive sleep apnea. She is not presently using CPAP. She does describe excessive daytime sleepiness. She awakens with morning headache. She falls asleep easily during the day.  Past Medical History:  Diagnosis Date  . Abdominal pain, left upper quadrant 12/11/2012  . Arthritis   . Asthma   . Cancer ([place])    melanoma  . Collagen vascular disease ([place])   . Diabetes mellitus without complication ([place])   . Diabetic nephropathy associated with secondary diabetes mellitus ([place])   . Flushing 12/11/2012  . Gastroparesis   . Gross hematuria 12/11/2012  . Hepatic cirrhosis ([place])   . Hyperlipidemia   . Hypertension   . Kidney stone 12/11/2012  . Morbid obesity with BMI  of 40.0-44.9, adult ([place])   . Nausea without vomiting 12/11/2012  . Nephrolithiasis   . Nephrolithiasis 04/26/2014  . Nonproliferative retinopathy due to secondary diabetes ([place])   . Peripheral vascular disease ([place])   . Renal colic 33/29/5188  . Sciatica   . Sciatica 12/11/2012  . Skin cancer   . Sleep apnea   . Unilateral small kidney without contralateral hypertrophy     Past Surgical History:  Procedure Laterality Date  . ABDOMINAL HYSTERECTOMY    . BACK SURGERY  04/1999   L-4-5 LAMINECTOMY  . CESAREAN SECTION  1980  . COLONOSCOPY  05/04/2001  . COLONOSCOPY WITH PROPOFOL N/A 09/20/2016   Procedure: COLONOSCOPY WITH PROPOFOL;  Surgeon: Lollie Sails, MD;  Location: [facility] ENDOSCOPY;  Service: Endoscopy;  Laterality: N/A;  . DILATION AND CURETTAGE OF UTERUS    . ESOPHAGOGASTRODUODENOSCOPY  02/08/2014  . ESOPHAGOGASTRODUODENOSCOPY (EGD) WITH PROPOFOL N/A 09/20/2016   Procedure: ESOPHAGOGASTRODUODENOSCOPY (EGD) WITH PROPOFOL;  Surgeon: Lollie Sails, MD;  Location: [facility] ENDOSCOPY;  Service: Endoscopy;  Laterality: N/A;  . EYE SURGERY  1986   FOR MELANOMA  . JOINT REPLACEMENT Left 09/24/2013   TOTAL KNEE  . MOHS SURGERY  11/2016   left side of nose  . TONSILLECTOMY     WITH ADENOIDECTOMY    MEDICATIONS: I have reviewed all medications and confirmed regimen as documented  Social History   Social History  . Marital status: Single    Spouse name: N/A  . Number of children: N/A  . Years of education: N/A   Occupational  History  . Not on file.   Social History Main Topics  . Smoking status: Never Smoker  . Smokeless tobacco: Never Used  . Alcohol use No  . Drug use: No  . Sexual activity: Not on file   Other Topics Concern  . Not on file   Social History Narrative  . No narrative on file    Family History  Problem Relation Age of Onset  . Heart disease Mother   . Cancer Mother   . Asthma Mother   . Diabetes Father   . Kidney disease Father   .  Hypertension Father   . Breast cancer Neg Hx     ROS: No fever, myalgias/arthralgias, unexplained weight loss or weight gain No new focal weakness or sensory deficits No otalgia, hearing loss, visual changes, nasal and sinus symptoms, mouth and throat problems No neck pain or adenopathy No abdominal pain, N/V/D, diarrhea, change in bowel pattern No dysuria, change in urinary pattern   Vitals:   09/30/17 1141 09/30/17 1147  BP:  (!) 142/78  Pulse:  71  SpO2:  92%  Weight: 127.9 kg (282 lb)   Height: 5\' 6"  (1.676 m)   RA   EXAM:  Gen: Obese, poor hygiene, ambulates with walker, not coughing during this encounter HEENT: NCAT, sclera white, oropharynx normal Neck: Supple without LAN, thyromegaly, JVD Lungs: breath sounds are slightly coarse without wheezes or other adventitious sounds Cardiovascular: RRR, no murmurs noted Abdomen: Soft, nontender, normal BS Ext: Symmetric pretibial, ankle and pedal edema Neuro: CNs grossly intact, motor and sensory intact Skin: Limited exam, no lesions noted  DATA:   BMP Latest Ref Rng & Units 12/07/2016 06/03/2016 10/23/2014  Glucose 65 - 99 mg/dL 43(LL) - 250(H)  BUN 6 - 20 mg/dL 13 - 17  Creatinine 0.44 - 1.00 mg/dL 0.66 0.70 0.99  Sodium 135 - 145 mmol/L 139 - 139  Potassium 3.5 - 5.1 mmol/L 3.7 - 4.1  Chloride 101 - 111 mmol/L 103 - 101  CO2 22 - 32 mmol/L 28 - 29  Calcium 8.9 - 10.3 mg/dL 9.9 - 8.5    CBC Latest Ref Rng & Units 07/13/2017 09/20/2016 12/10/2014  WBC 3.6 - 11.0 K/uL 8.4 9.5 7.8  Hemoglobin 12.0 - 16.0 g/dL 12.0 12.0 12.6  Hematocrit 35.0 - 47.0 % 36.0 37.6 39.5  Platelets 150 - 440 K/uL 202 249 197    CXR:  No recent film  IMPRESSION:     ICD-10-CM   1. Chronic cough R05 Pulmonary Function Test [facility] Only  2. Chronic bronchitis, unspecified chronic bronchitis type ([place]) J42   3. DOE - likely multifactorial. Doubt "COPD" R06.09 Pulmonary Function Test [facility] Only  4. obesity E66.01    Z68.42   5. OSA  (obstructive sleep apnea) G47.33   6. Bilateral leg edema R60.0   7. Excessive daytime sleepiness G47.19 Split night study   Chronic cough is of unclear etiology. She does describe chronic mucus production consistent with chronic bronchitis. However, it is notable that she is on an ACE inhibitor. There is not much utility in undertaking a thorough investigation for chronic cough without first stopping the ACE inhibitor.  Having never smoked, it is doubtful that she has true COPD. She has no significant occupational or environmental exposures. Her exertional dyspnea is most likely due to severe obesity.  He has many symptoms of untreated OSA including excessive daytime sleepiness. Also, chronic lower extremity edema might be a manifestation of chronic hypoxemia during sleep with right  ventricular failure.  PLAN:  Stop lisinopril Candesartan 30 mg by mouth daily in place of lisinopril Doxycycline 100 mg twice a day for 10 days PFTs ordered Split-night sleep study ordered Follow-up in 6-8 weeks   Merton Border, MD PCCM service Mobile [phone] Pager [phone] 10/03/2017 3:56 PM

## 2017-10-05 ENCOUNTER — Inpatient Hospital Stay: Payer: Medicare Other

## 2017-10-05 ENCOUNTER — Inpatient Hospital Stay: Payer: Medicare Other | Attending: Oncology

## 2017-10-05 ENCOUNTER — Inpatient Hospital Stay (HOSPITAL_BASED_OUTPATIENT_CLINIC_OR_DEPARTMENT_OTHER): Payer: Medicare Other | Admitting: Oncology

## 2017-10-05 VITALS — BP 176/81 | HR 61 | Temp 98.9°F | Resp 20 | Wt 281.9 lb

## 2017-10-05 DIAGNOSIS — K7460 Unspecified cirrhosis of liver: Secondary | ICD-10-CM | POA: Insufficient documentation

## 2017-10-05 DIAGNOSIS — E785 Hyperlipidemia, unspecified: Secondary | ICD-10-CM | POA: Insufficient documentation

## 2017-10-05 DIAGNOSIS — Z808 Family history of malignant neoplasm of other organs or systems: Secondary | ICD-10-CM

## 2017-10-05 DIAGNOSIS — Z794 Long term (current) use of insulin: Secondary | ICD-10-CM | POA: Diagnosis not present

## 2017-10-05 DIAGNOSIS — Z79899 Other long term (current) drug therapy: Secondary | ICD-10-CM | POA: Diagnosis not present

## 2017-10-05 DIAGNOSIS — Z85820 Personal history of malignant melanoma of skin: Secondary | ICD-10-CM

## 2017-10-05 DIAGNOSIS — M549 Dorsalgia, unspecified: Secondary | ICD-10-CM | POA: Insufficient documentation

## 2017-10-05 DIAGNOSIS — R531 Weakness: Secondary | ICD-10-CM | POA: Insufficient documentation

## 2017-10-05 DIAGNOSIS — I10 Essential (primary) hypertension: Secondary | ICD-10-CM

## 2017-10-05 DIAGNOSIS — D509 Iron deficiency anemia, unspecified: Secondary | ICD-10-CM | POA: Diagnosis not present

## 2017-10-05 DIAGNOSIS — E1121 Type 2 diabetes mellitus with diabetic nephropathy: Secondary | ICD-10-CM | POA: Insufficient documentation

## 2017-10-05 DIAGNOSIS — R5382 Chronic fatigue, unspecified: Secondary | ICD-10-CM | POA: Diagnosis not present

## 2017-10-05 DIAGNOSIS — R5381 Other malaise: Secondary | ICD-10-CM | POA: Diagnosis not present

## 2017-10-05 LAB — FERRITIN: FERRITIN: 96 ng/mL (ref 11–307)

## 2017-10-05 LAB — CBC WITH DIFFERENTIAL/PLATELET
BASOS ABS: 0.1 10*3/uL (ref 0–0.1)
BASOS PCT: 1 %
EOS PCT: 6 %
Eosinophils Absolute: 0.4 10*3/uL (ref 0–0.7)
HCT: 37.7 % (ref 35.0–47.0)
Hemoglobin: 12.3 g/dL (ref 12.0–16.0)
Lymphocytes Relative: 31 %
Lymphs Abs: 2.2 10*3/uL (ref 1.0–3.6)
MCH: 28.8 pg (ref 26.0–34.0)
MCHC: 32.6 g/dL (ref 32.0–36.0)
MCV: 88.3 fL (ref 80.0–100.0)
MONO ABS: 0.4 10*3/uL (ref 0.2–0.9)
Monocytes Relative: 6 %
Neutro Abs: 4 10*3/uL (ref 1.4–6.5)
Neutrophils Relative %: 56 %
PLATELETS: 188 10*3/uL (ref 150–440)
RBC: 4.27 MIL/uL (ref 3.80–5.20)
RDW: 14.9 % — AB (ref 11.5–14.5)
WBC: 7.1 10*3/uL (ref 3.6–11.0)

## 2017-10-05 LAB — IRON AND TIBC
IRON: 48 ug/dL (ref 28–170)
Saturation Ratios: 19 % (ref 10.4–31.8)
TIBC: 251 ug/dL (ref 250–450)
UIBC: 203 ug/dL

## 2017-10-05 NOTE — Progress Notes (Signed)
Pt here for follow up. Stated on 10/17 having surg for spinal stimulator ( test first )

## 2017-10-10 ENCOUNTER — Ambulatory Visit: Payer: Medicare Other | Admitting: Student in an Organized Health Care Education/Training Program

## 2017-10-11 NOTE — Treatment Plan (Signed)
Spoke with patient at approximately 10:15 AM regarding spinal cord stimulator trial tomorrow. I answered all the patient's questions, informed her of the technical aspects of the procedure along with its risks and potential benefits. Instructed patient to bathe with chlorhexidine cleaning solution that I provided to her tomorrow morning.Reviewed nothing by mouth guidelines with her, nothing by mouth solid food after midnight tonight.

## 2017-10-12 ENCOUNTER — Ambulatory Visit (HOSPITAL_BASED_OUTPATIENT_CLINIC_OR_DEPARTMENT_OTHER): Payer: Medicare Other | Admitting: Student in an Organized Health Care Education/Training Program

## 2017-10-12 ENCOUNTER — Other Ambulatory Visit: Payer: Medicare Other

## 2017-10-12 ENCOUNTER — Encounter: Payer: Self-pay | Admitting: Student in an Organized Health Care Education/Training Program

## 2017-10-12 ENCOUNTER — Ambulatory Visit: Payer: Medicare Other | Admitting: Oncology

## 2017-10-12 ENCOUNTER — Ambulatory Visit: Payer: Medicare Other

## 2017-10-12 ENCOUNTER — Ambulatory Visit
Admission: RE | Admit: 2017-10-12 | Discharge: 2017-10-12 | Disposition: A | Discharge status: Home / Self Care | Payer: Medicare Other | Source: Ambulatory Visit | Attending: Student in an Organized Health Care Education/Training Program | Admitting: Student in an Organized Health Care Education/Training Program

## 2017-10-12 VITALS — BP 130/69 | HR 67 | Temp 98.7°F | Resp 17 | Ht 66.0 in | Wt 281.0 lb

## 2017-10-12 DIAGNOSIS — M47816 Spondylosis without myelopathy or radiculopathy, lumbar region: Secondary | ICD-10-CM

## 2017-10-12 DIAGNOSIS — M79605 Pain in left leg: Secondary | ICD-10-CM | POA: Diagnosis not present

## 2017-10-12 DIAGNOSIS — M48061 Spinal stenosis, lumbar region without neurogenic claudication: Secondary | ICD-10-CM | POA: Insufficient documentation

## 2017-10-12 DIAGNOSIS — M961 Postlaminectomy syndrome, not elsewhere classified: Secondary | ICD-10-CM | POA: Diagnosis not present

## 2017-10-12 DIAGNOSIS — E119 Type 2 diabetes mellitus without complications: Secondary | ICD-10-CM | POA: Insufficient documentation

## 2017-10-12 DIAGNOSIS — Z9689 Presence of other specified functional implants: Secondary | ICD-10-CM | POA: Diagnosis not present

## 2017-10-12 DIAGNOSIS — G8929 Other chronic pain: Secondary | ICD-10-CM

## 2017-10-12 DIAGNOSIS — M47896 Other spondylosis, lumbar region: Secondary | ICD-10-CM

## 2017-10-12 DIAGNOSIS — M545 Low back pain: Secondary | ICD-10-CM | POA: Diagnosis not present

## 2017-10-12 DIAGNOSIS — Z9889 Other specified postprocedural states: Secondary | ICD-10-CM | POA: Diagnosis not present

## 2017-10-12 DIAGNOSIS — M79604 Pain in right leg: Secondary | ICD-10-CM

## 2017-10-12 DIAGNOSIS — Z794 Long term (current) use of insulin: Secondary | ICD-10-CM | POA: Insufficient documentation

## 2017-10-12 MED ORDER — FENTANYL CITRATE (PF) 100 MCG/2ML IJ SOLN
25.0000 ug | INTRAMUSCULAR | Status: DC | PRN
  Administered 2017-10-12: 100 ug via INTRAVENOUS
  Filled 2017-10-12: qty 2

## 2017-10-12 MED ORDER — MIDAZOLAM HCL 5 MG/5ML IJ SOLN
1.0000 mg | INTRAMUSCULAR | Status: DC | PRN
  Administered 2017-10-12: 0.5 mg via INTRAVENOUS
  Filled 2017-10-12: qty 5

## 2017-10-12 MED ORDER — LIDOCAINE HCL (PF) 1 % IJ SOLN
INTRAMUSCULAR | Status: AC
  Filled 2017-10-12: qty 10

## 2017-10-12 MED ORDER — CEPHALEXIN 500 MG PO CAPS: ORAL_CAPSULE | ORAL | 0 refills | Status: DC

## 2017-10-12 MED ORDER — LIDOCAINE HCL (PF) 1 % IJ SOLN
10.0000 mL | Freq: Once | INTRAMUSCULAR | Status: AC
  Administered 2017-10-12: 20 mL
  Filled 2017-10-12: qty 10

## 2017-10-12 MED ORDER — LIDOCAINE HCL (PF) 1 % IJ SOLN
INTRAMUSCULAR | Status: AC
  Filled 2017-10-12: qty 5

## 2017-10-12 MED ORDER — DEXTROSE 5 % IV SOLN
3.0000 g | Freq: Once | INTRAVENOUS | Status: AC
  Administered 2017-10-12: 3 g via INTRAVENOUS
  Filled 2017-10-12: qty 3000

## 2017-10-12 MED ORDER — LACTATED RINGERS IV SOLN
1000.0000 mL | Freq: Once | INTRAVENOUS | Status: AC
  Administered 2017-10-12: 1000 mL via INTRAVENOUS

## 2017-10-12 MED ORDER — CEFAZOLIN SODIUM 1 G IJ SOLR
INTRAMUSCULAR | Status: AC
  Filled 2017-10-12: qty 30

## 2017-10-12 NOTE — Progress Notes (Signed)
Patient's Name: Brittney [name]  MRN: [id number]  Referring Provider: [name], MD  DOB: 04-Dec-1951  PCP: Rusty Aus, MD  DOS: 10/12/2017  Note by: [name], MD  Service setting: Ambulatory outpatient  Specialty: Interventional Pain Management  Patient type: Established  Location: [facility] (AMB) Pain Management Facility  Visit type: Interventional Procedure   Primary Reason for Visit: Interventional Pain Management Treatment. CC: Leg Pain (right) and Back Pain (low)  Procedure:  Anesthesia, Analgesia, Anxiolysis:  Type: Fluoroscopically guided placement of Spinal Cord Stimulator for NEVRO trial. Company: Region: Thoracic Level: Lead 1: superior endplate of T8    Lead 2:mid vertebral body of T9 Laterality: Midline         Type: Local Anesthesia with Moderate (Conscious) Sedation Local Anesthetic: Lidocaine 1% Route: Intravenous (IV) IV Access: Secured Sedation: Meaningful verbal contact was maintained at all times during the procedure  Indication(s): Analgesia and Anxiety  Indications: 1. Status post lumbar spine surgery for decompression of spinal cord   2. Failed back surgical syndrome   3. Chronic lower extremity pain (Location of Secondary source of pain) (Bilateral) (R>L)   4. L3-4 severe lumbar facet hypertrophy and spinal stenosis    Trial lead kit 1 Lot: [id number], expiration 07/07/2020, thoracic lead: 1058-50 B Each trial lead kit contains 18 contact 5 mm blue percutaneous lead, 50 cm  Trial lead kit 2 lot: [id number], expiration 02/02/2020, thoracic lead: 1058-50 B Each trial lead kit contains 18 contact 5 mm blue percutaneous lead, 50 cm  Pain Score: Pre-procedure: 6 /10 Post-procedure: 2 /10  Pre-op Assessment:  Brittney [name] is a 66 y.o. (year old), female patient, seen today for interventional treatment. She  has a past surgical history that includes Cesarean section (1980); Dilation and curettage of uterus; Eye surgery (1986); Abdominal hysterectomy; Joint  replacement (Left, 09/24/2013); Back surgery (04/1999); Tonsillectomy; Esophagogastroduodenoscopy (02/08/2014); Colonoscopy (05/04/2001); Esophagogastroduodenoscopy (egd) with propofol (N/A, 09/20/2016); Colonoscopy with propofol (N/A, 09/20/2016); and Mohs surgery (11/2016). Brittney [name] has a current medication list which includes the following prescription(s): albuterol, alprazolam, bumetanide, butalbital-acetaminophen-caffeine, candesartan, carvedilol, cephalexin, clonidine, cyclobenzaprine, diclofenac sodium, doxazosin, escitalopram, fluticasone, gabapentin, hydralazine, hydrocortisone, ibuprofen, insulin detemir, insulin pen needle, insulin regular human concentrated, lidocaine, metoclopramide, ondansetron, pantoprazole, potassium chloride, pramipexole, tramadol, trazodone, triamcinolone ointment, and zolpidem, and the following Facility-Administered Medications: cefazolin, fentanyl, and midazolam. Her primarily concern today is the Leg Pain (right) and Back Pain (low)  66 year old female with failed back surgery syndrome who presents today for Nevro spinal cord stimulation trial. Thoracic MRI was unremarkable for thoracic spinal canal stenosis. Patient has seen psychologist and was deemed an appropriate candidate for spinal cord stim ablation trial. Recent benefits were discussed in detail with the patient Re: the procedure. Patient is a diabetic and I emphasized to her the importance of keeping her SCS battery site clean with dressing intact. Patient will also be prescribed Keflex 500 mg 4 times a day for 7 days while the trial leads are in place. Patient has been advised not to perform any bending, lifting, twisting to ensure that electrodes remain in correct location.  Thoracic and Lumbar MRI  IMPRESSION: MRI thoracic spine:  1. Degenerative change without canal stenosis or neural foraminal narrowing. MRI lumbar spine:  1. Interval L3-4 PLIF. RIGHT lateral L3-4 plate and screw fixation ; plate  has backed out 2 cm into the iliopsoas muscle and may affect the exited RIGHT L3 nerve. New RIGHT iliopsoas muscle atrophy. 2. L4-5 PLIF, stable grade 1 L4-5 anterolisthesis. 3. Minimal canal stenosis  L3-4, mild at L2-3. Minimal to mild L2-3 through L5-S1 neural foraminal narrowing.   Initial Vital Signs: There were no vitals taken for this visit. BMI: Estimated body mass index is 45.35 kg/m as calculated from the following:   Height as of this encounter: '5\' 6"'  (1.676 m).   Weight as of this encounter: 281 lb (127.5 kg).  Risk Assessment: Allergies: Reviewed. She is allergic to amlodipine; imdur [isosorbide dinitrate]; lyrica [pregabalin]; monosodium glutamate; shellfish allergy; tape; and eggs or egg-derived products.  Allergy Precautions: None required Coagulopathies: Reviewed. None identified.  Blood-thinner therapy: None at this time Active Infection(s): Reviewed. None identified. Brittney [name] is afebrile  Site Confirmation: Brittney [name] was asked to confirm the procedure and laterality before marking the site Procedure checklist: Completed Consent: Before the procedure and under the influence of no sedative(s), amnesic(s), or anxiolytics, the patient was informed of the treatment options, risks and possible complications. To fulfill our ethical and legal obligations, as recommended by the American Medical Association's Code of Ethics, I have informed the patient of my clinical impression; the nature and purpose of the treatment or procedure; the risks, benefits, and possible complications of the intervention; the alternatives, including doing nothing; the risk(s) and benefit(s) of the alternative treatment(s) or procedure(s); and the risk(s) and benefit(s) of doing nothing. The patient was provided information about the general risks and possible complications associated with the procedure. These may include, but are not limited to: failure to achieve desired goals, infection, bleeding,  organ or nerve damage, allergic reactions, paralysis, and death. In addition, the patient was informed of those risks and complications associated to Spine-related procedures, such as failure to decrease pain; infection (i.e.: Meningitis, epidural or intraspinal abscess); bleeding (i.e.: epidural hematoma, subarachnoid hemorrhage, or any other type of intraspinal or peri-dural bleeding); organ or nerve damage (i.e.: Any type of peripheral nerve, nerve root, or spinal cord injury) with subsequent damage to sensory, motor, and/or autonomic systems, resulting in permanent pain, numbness, and/or weakness of one or several areas of the body; allergic reactions; (i.e.: anaphylactic reaction); and/or death. Furthermore, the patient was informed of those risks and complications associated with the medications. These include, but are not limited to: allergic reactions (i.e.: anaphylactic or anaphylactoid reaction(s)); adrenal axis suppression; blood sugar elevation that in diabetics may result in ketoacidosis or comma; water retention that in patients with history of congestive heart failure may result in shortness of breath, pulmonary edema, and decompensation with resultant heart failure; weight gain; swelling or edema; medication-induced neural toxicity; particulate matter embolism and blood vessel occlusion with resultant organ, and/or nervous system infarction; and/or aseptic necrosis of one or more joints. Finally, the patient was informed that Medicine is not an exact science; therefore, there is also the possibility of unforeseen or unpredictable risks and/or possible complications that may result in a catastrophic outcome. The patient indicated having understood very clearly. We have given the patient no guarantees and we have made no promises. Enough time was given to the patient to ask questions, all of which were answered to the patient's satisfaction. Ms. Ocanas has indicated that she wanted to continue with  the procedure. Attestation: I, the ordering provider, attest that I have discussed with the patient the benefits, risks, side-effects, alternatives, likelihood of achieving goals, and potential problems during recovery for the procedure that I have provided informed consent. Date: 10/12/2017; Time: 8:11 AM  Pre-Procedure Preparation:  Monitoring: As per clinic protocol. Respiration, ETCO2, SpO2, BP, heart rate and rhythm monitor placed and checked  for adequate function Safety Precautions: Patient was assessed for positional comfort and pressure points before starting the procedure. Time-out: I initiated and conducted the "Time-out" before starting the procedure, as per protocol. The patient was asked to participate by confirming the accuracy of the "Time Out" information. Verification of the correct person, site, and procedure were performed and confirmed by me, the nursing staff, and the patient. "Time-out" conducted as per Joint Commission's Universal Protocol (UP.01.01.01). "Time-out" Date & Time: 10/12/2017; 0912 hrs.  Description of Procedure Process:   Position: Prone   Target Area: The interlaminar space, paramedian approach  Informed consent was obtained and potential risks discussed including, but not limited to: bleeding, bruising, severe allergic reaction to components of the injection materials, compression of the spinal cord, infection (superficial, deep, abscess and meningitis), nerve or spinal cord damage, paralysis, inability to place the needle properly, arachnoiditis, the possibility of no benefit (pain relief) derived from the injection, or in rare occasions worsening of pain. Questions were answered to the patient's satisfaction and the patient wishes to proceed. Alternative options for treatment have previously been discussed and explored with the patient.  The patient denies taking antiplatelet or anticoagulation medications and has a driver today.   After informed consent  was signed and an IV started, the patient was positioned prone on the fluoroscopy table and pressure points were padded. Standard ASA monitors were applied. A timeout protocol was performed per [facility] hospital policy. Hand washing with antibacterial soap and water and/or the use of alcohol based cleanser was performed. Proper protective gear was worn by the physician including a mask, scrub cap, sterile gown, and sterile gloves.   The patient received IV cefazolin 3 grams before the start of the procedure.  The back was washed generously with Chloroprep x 3 and draped in a sterile fashion with laparatomy drape. The C-arm was covered with a sterile plastic cover.   An AP fluoroscopic view of T11-L3 was identified and optimized with a slight caudad angulation. The planned point of entry was marked, medial to the left L1 pedicle. Using a 25-gauge needle and 0.5% lidocaine plain a skin wheal was raised and deeper tissues infiltrated.   Using a 14g Tuohy 6 inch epidural needle provided in the SCS trial kit and starting paramedially aiming toward midline, access to the epidural space at T12/L1 was attained utilizing LOR technique with air and intermittent fluoroscopic AP and lateral guidance. The styletted SCS  lead was advanced under both lateral and AP fluoroscopy and was determined to be in the posterior epidural space.  Another planned point of entry was marked, medial to the  right L1 pedicle. Using a 25-gauge needle and 0.5% lidocaine plain a skin wheal was raised and deeper tissues infiltrated.   Using the provided 14g Tuohy epidural needle from the SCS trial kit and starting paramedially aiming toward midline, access to the epidural space at was attained utilizing LOR technique at the T12/L1 with air and intermittent fluoroscopic AP and lateral guidance. The styletted Nevro lead was advanced under both lateral and AP fluoroscopy and was determined to be in the posterior epidural space.  With the  assistance of the SCS company representative, impedances were checked and were within normal limits.  The final position of the lead was from the superior of T-8 on the right to the middle of T-9 on the left.  The leads were secured with multiple sterile gauze, Steri-Strips, tegaderms. This was reinforced with Mastisol and additional Tegaderm to cover the majority  of her lumbosacral region in the perimeter of this was taped down.  Start Time: 0915 hrs. End Time: 1115 hrs.  Medication(s): We administered midazolam, fentaNYL, lactated ringers, lidocaine (PF), and ceFAZolin. patient also received 3 g of Ancef prior to the start of the procedure.it is Please see chart orders for dosing details.   Type of Imaging Technique: Fluoroscopy Guidance (Spinal) Indication(s): Assistance in needle guidance and placement for procedures requiring needle placement in or near specific anatomical locations not easily accessible without such assistance. Exposure Time: Please see nurses notes.  Antibiotic Prophylaxis:  Indication(s): Surgical Prophylaxis. Antibiotic given: 3 g of cefazolin prior to start of procedure  Post-operative Assessment:  EBL: None Complications: No immediate post-treatment complications observed by team, or reported by patient. Note: The patient tolerated the entire procedure well. A repeat set of vitals were taken after the procedure and the patient was kept under observation following institutional policy, for this type of procedure. Post-procedural neurological assessment was performed, showing return to baseline, prior to discharge. The patient was provided with post-procedure discharge instructions, including a section on how to identify potential problems. Should any problems arise concerning this procedure, the patient was given instructions to immediately contact us, at any time, without hesitation. In any case, we plan to contact the patient by telephone for a follow-up status  report regarding this interventional procedure. Comments:  No additional relevant information. Physical exam unchanged from baseline. Right lower extremity 4 out of 5 strength knee extension, knee flexion, plantar flexion, dorsiflexion. This is at baseline. Left lower extremity 5 out of 5 strength knee extension, knee flexion, plantar flexion, dorsiflexion. This is at baseline. Plan of Care  Patient provided with discharge instructions below.  -As long as the leads are in place, do not bathe or shower. You may sponge bathe.  -While the lead is in place, please limit the bending, lifting, or twisting because the lead can move.  -It is VERY important that you pick up the antibiotics we prescribed, Keflex, on your way home from the trial and take them as prescribed(4 times a day), starting today, for as long as the lead is in place.  -The Spina Cord Stimulator Representative will be in contact with you while the lead is in place to make sure the trial goes as well as possible.  -Please contact us with any questions or concerns at any time during the trial.   -If you start running a fever over 100 degrees, have severe back pain, or new pain running down the legs, or drainage coming from the lead site, contact us immediately and/or go to the emergency room.  -Please do not restart any sort of medication that can thin your blood such as Aspirin, ibuprofen, motrin, aleve, plavix, coumadin, etc. If you aren't sure, call and ask.  -We will have you return on Wed Oct 25  to have the leads removed. If this is successful, at that point we can go over the details about the permanent implant.   Imaging Orders     DG C-Arm 1-60 Min-No Report Procedure Orders    No procedure(s) ordered today    Medications ordered for procedure: Meds ordered this encounter  Medications  . midazolam (VERSED) 5 MG/5ML injection 1-2 mg    Make sure Flumazenil is available in the pyxis when using this medication. If  oversedation occurs, administer 0.2 mg IV over 15 sec. If after 45 sec no response, administer 0.2 mg again over 1 min; may repeat at  1 min intervals; not to exceed 4 doses (1 mg)  . fentaNYL (SUBLIMAZE) injection 25-50 mcg    Make sure Narcan is available in the pyxis when using this medication. In the event of respiratory depression (RR< 8/min): Titrate NARCAN (naloxone) in increments of 0.1 to 0.2 mg IV at 2-3 minute intervals, until desired degree of reversal.  . lactated ringers infusion 1,000 mL  . lidocaine (PF) (XYLOCAINE) 1 % injection 10 mL  . cephALEXin (KEFLEX) 500 MG capsule    Sig: TAKE 1 CAPSULE (500 MG TOTAL) BY MOUTH 4 (FOUR) TIMES DAILY for 7 days (duration of trial)    Dispense:  28 capsule    Refill:  0  . ceFAZolin (ANCEF) 3 g in dextrose 5 % 50 mL IVPB    Order Specific Question:   Antibiotic Indication:    Answer:   Surgical Prophylaxis   Medications administered: We administered midazolam, fentaNYL, lactated ringers, lidocaine (PF), and ceFAZolin.  See the medical record for exact dosing, route, and time of administration.  New Prescriptions   No medications on file   Disposition: Discharge home  Discharge Date & Time: 10/12/2017; 1200 hrs.   Physician-requested Follow-up: Return in 7 days (on 10/19/2017) for Post Procedure Evaluation. Future Appointments Date Time Provider [place]  10/19/2017 11:00 AM [name], MD [facility] None  11/08/2017 3:30 PM [facility] None  11/11/2017 2:30 PM Wilhelmina Mcardle, MD [facility] None  02/08/2018 1:00 PM [facility] None  02/08/2018 1:30 PM Lloyd Huger, MD [facility] None  02/08/2018 2:00 PM [facility] INFUSION CHAIR 3 [facility] None   Primary Care Physician: Rusty Aus, MD Location: [facility] Note by: [name], MD Date: 10/12/2017; Time: 12:25 PM  Disclaimer:  Medicine is not an exact science. The only guarantee in medicine is that nothing  is guaranteed. It is important to note that the decision to proceed with this intervention was based on the information collected from the patient. The Data and conclusions were drawn from the patient's questionnaire, the interview, and the physical examination. Because the information was provided in large part by the patient, it cannot be guaranteed that it has not been purposely or unconsciously manipulated. Every effort has been made to obtain as much relevant data as possible for this evaluation. It is important to note that the conclusions that lead to this procedure are derived in large part from the available data. Always take into account that the treatment will also be dependent on availability of resources and existing treatment guidelines, considered by other Pain Management Practitioners as being common knowledge and practice, at the time of the intervention. For Medico-Legal purposes, it is also important to point out that variation in procedural techniques and pharmacological choices are the acceptable norm. The indications, contraindications, technique, and results of the above procedure should only be interpreted and judged by a Board-Certified Interventional Pain Specialist with extensive familiarity and expertise in the same exact procedure and technique.

## 2017-10-12 NOTE — Progress Notes (Signed)
Safety precautions to be maintained throughout the outpatient stay will include: orient to surroundings, keep bed in low position, maintain call bell within reach at all times, provide assistance with transfer out of bed and ambulation.  

## 2017-10-12 NOTE — Patient Instructions (Signed)
Today we did the following  -We have done a Spinal Cord Stimulator Trial with NEVRO  -As long as the leads are in place, do not bathe or shower. You may sponge bathe.  -While the lead is in place, please limit the bending, lifting, or twisting because the lead can move.  -The things we want to see is if your pain improves (and by what percentage), if you can do more activity (don't overdo it), and if you can use less of your "as needed" medicine. Do not stop long acting medicines like methadone, oxycontin, MS Contin, etc without checking with [place].  -It is VERY important that you pick up the antibiotics we prescribed, Keflex, on your way home from the trial and take them as prescribed(4 times a day), starting today, for as long as the lead is in place.  -The Spinal Cord Stimulator Representative will be in contact with you while the lead is in place to make sure the trial goes as well as possible.  -Please contact us with any questions or concerns at any time during the trial.   -If you start running a fever over 100 degrees, have severe back pain, or new pain running down the legs, or drainage coming from the lead site, contact us immediately and/or go to the emergency room.  -Please do not restart any sort of medication that can thin your blood such as Aspirin, ibuprofen, motrin, aleve, plavix, coumadin, etc. If you aren't sure, call and ask.  -We will have you return on Wednesday October 24  to have the lead removed.   If this is successful, at that point we can go over the details about the permanent implant.

## 2017-10-13 ENCOUNTER — Telehealth: Payer: Self-pay | Admitting: *Deleted

## 2017-10-13 NOTE — Progress Notes (Signed)
I spoke with Brittney [name] this morning and she states that she is doing well. She states that she did have some back soreness last night which is to be expected. She took extra pain medication and states that it helped. She is endorsing some pain relief already with the spinal cord stimulator. She states that it is easier to walk from her bedroom toward the den with the stimulator in place. She is taking her antibiotics: Keflex 500 mg 4 times a day when the stimulator is in place. She is afebrile and doing well. I have given her clinic and my contact information should she have any questions or concerns.

## 2017-10-13 NOTE — Telephone Encounter (Signed)
Dr. Holley Raring spoke with patient, states she is ok.

## 2017-10-17 ENCOUNTER — Encounter: Payer: Self-pay | Admitting: Student in an Organized Health Care Education/Training Program

## 2017-10-17 NOTE — Progress Notes (Signed)
Spoke with patient regarding her spinal cord stimulator trial. Patient is very pleased with the pain relief that the spinal cord stimulator trial was providing her. She endorses improvement in her quality of life and her functional status. Patient states that she is able to walk a longer period of time and do chores within her house to a greater extent in less pain than without the trial. Patient does note skin irritation and unraveling of the Tegaderm. She had her daughter reinforce the Tegaderm dressing last night. Patient is pleased with trial results so far and is requesting to have her spinal cord stimulator leads removed tomorrow rather than Wednesday given skin irritation from the Tegaderm. This is reasonable. Patient is continuing antibiotics. No fevers or back pain from the procedure. Strength at baseline per patient.

## 2017-10-18 ENCOUNTER — Encounter: Payer: Self-pay | Admitting: Student in an Organized Health Care Education/Training Program

## 2017-10-18 ENCOUNTER — Ambulatory Visit
Payer: Medicare Other | Attending: Student in an Organized Health Care Education/Training Program | Admitting: Student in an Organized Health Care Education/Training Program

## 2017-10-18 ENCOUNTER — Ambulatory Visit
Admission: RE | Admit: 2017-10-18 | Discharge: 2017-10-18 | Disposition: A | Discharge status: Home / Self Care | Payer: Medicare Other | Source: Ambulatory Visit | Attending: Student in an Organized Health Care Education/Training Program | Admitting: Student in an Organized Health Care Education/Training Program

## 2017-10-18 VITALS — BP 140/78 | HR 75 | Temp 98.6°F | Resp 16 | Ht 66.0 in | Wt 279.0 lb

## 2017-10-18 DIAGNOSIS — M797 Fibromyalgia: Secondary | ICD-10-CM | POA: Diagnosis not present

## 2017-10-18 DIAGNOSIS — G894 Chronic pain syndrome: Secondary | ICD-10-CM | POA: Diagnosis not present

## 2017-10-18 DIAGNOSIS — M48061 Spinal stenosis, lumbar region without neurogenic claudication: Secondary | ICD-10-CM | POA: Insufficient documentation

## 2017-10-18 DIAGNOSIS — Z9889 Other specified postprocedural states: Secondary | ICD-10-CM | POA: Diagnosis not present

## 2017-10-18 DIAGNOSIS — E11319 Type 2 diabetes mellitus with unspecified diabetic retinopathy without macular edema: Secondary | ICD-10-CM | POA: Diagnosis not present

## 2017-10-18 DIAGNOSIS — M1711 Unilateral primary osteoarthritis, right knee: Secondary | ICD-10-CM | POA: Diagnosis not present

## 2017-10-18 DIAGNOSIS — M5416 Radiculopathy, lumbar region: Secondary | ICD-10-CM

## 2017-10-18 DIAGNOSIS — E1122 Type 2 diabetes mellitus with diabetic chronic kidney disease: Secondary | ICD-10-CM | POA: Diagnosis not present

## 2017-10-18 DIAGNOSIS — G8929 Other chronic pain: Secondary | ICD-10-CM

## 2017-10-18 DIAGNOSIS — E1121 Type 2 diabetes mellitus with diabetic nephropathy: Secondary | ICD-10-CM | POA: Diagnosis not present

## 2017-10-18 DIAGNOSIS — M545 Low back pain: Secondary | ICD-10-CM | POA: Insufficient documentation

## 2017-10-18 DIAGNOSIS — I129 Hypertensive chronic kidney disease with stage 1 through stage 4 chronic kidney disease, or unspecified chronic kidney disease: Secondary | ICD-10-CM | POA: Diagnosis not present

## 2017-10-18 DIAGNOSIS — M5441 Lumbago with sciatica, right side: Secondary | ICD-10-CM

## 2017-10-18 DIAGNOSIS — M79604 Pain in right leg: Secondary | ICD-10-CM | POA: Insufficient documentation

## 2017-10-18 DIAGNOSIS — N182 Chronic kidney disease, stage 2 (mild): Secondary | ICD-10-CM | POA: Diagnosis not present

## 2017-10-18 DIAGNOSIS — D509 Iron deficiency anemia, unspecified: Secondary | ICD-10-CM | POA: Insufficient documentation

## 2017-10-18 DIAGNOSIS — M5442 Lumbago with sciatica, left side: Secondary | ICD-10-CM | POA: Diagnosis not present

## 2017-10-18 DIAGNOSIS — E785 Hyperlipidemia, unspecified: Secondary | ICD-10-CM | POA: Insufficient documentation

## 2017-10-18 DIAGNOSIS — Z79891 Long term (current) use of opiate analgesic: Secondary | ICD-10-CM | POA: Insufficient documentation

## 2017-10-18 DIAGNOSIS — M961 Postlaminectomy syndrome, not elsewhere classified: Secondary | ICD-10-CM | POA: Diagnosis not present

## 2017-10-18 DIAGNOSIS — M47896 Other spondylosis, lumbar region: Secondary | ICD-10-CM | POA: Diagnosis not present

## 2017-10-18 DIAGNOSIS — M47816 Spondylosis without myelopathy or radiculopathy, lumbar region: Secondary | ICD-10-CM

## 2017-10-18 DIAGNOSIS — Z794 Long term (current) use of insulin: Secondary | ICD-10-CM | POA: Diagnosis not present

## 2017-10-18 DIAGNOSIS — G4733 Obstructive sleep apnea (adult) (pediatric): Secondary | ICD-10-CM | POA: Insufficient documentation

## 2017-10-18 DIAGNOSIS — E559 Vitamin D deficiency, unspecified: Secondary | ICD-10-CM | POA: Insufficient documentation

## 2017-10-18 DIAGNOSIS — R918 Other nonspecific abnormal finding of lung field: Secondary | ICD-10-CM | POA: Diagnosis not present

## 2017-10-18 DIAGNOSIS — M79605 Pain in left leg: Secondary | ICD-10-CM | POA: Diagnosis not present

## 2017-10-18 DIAGNOSIS — E1143 Type 2 diabetes mellitus with diabetic autonomic (poly)neuropathy: Secondary | ICD-10-CM | POA: Insufficient documentation

## 2017-10-18 DIAGNOSIS — Z981 Arthrodesis status: Secondary | ICD-10-CM | POA: Diagnosis not present

## 2017-10-18 DIAGNOSIS — K3184 Gastroparesis: Secondary | ICD-10-CM | POA: Diagnosis not present

## 2017-10-18 DIAGNOSIS — B379 Candidiasis, unspecified: Secondary | ICD-10-CM | POA: Insufficient documentation

## 2017-10-18 MED ORDER — FLUCONAZOLE 150 MG PO TABS
150.0000 mg | ORAL_TABLET | Freq: Once | ORAL | 0 refills | Status: AC
Start: 2017-10-18 — End: 2017-10-18

## 2017-10-18 NOTE — Progress Notes (Signed)
Patient's Name: Brittney [name]  MRN: [id number]  Referring Provider: Rusty Aus, MD  DOB: 05-12-1951  PCP: Rusty Aus, MD  DOS: 10/18/2017  Note by: [name], MD  Service setting: Ambulatory outpatient  Specialty: Interventional Pain Management  Location: [facility] (AMB) Pain Management Facility    Patient type: Established   Primary Reason(s) for Visit: Encounter for post-procedure evaluation of chronic illness with mild to moderate exacerbation CC: Back Pain (lower)  HPI  Brittney [name] is a 66 y.o. year old, female patient, who comes today for a post-procedure evaluation. She has Abnormal tumor markers; Chronic kidney disease, stage II (mild); Degenerative spondylolisthesis; Diabetes mellitus with peripheral vascular disease ([place]); Diabetes with retinopathy ([place]); Diabetic nephropathy ([place]); DM (diabetes mellitus) type II uncontrolled, periph vascular disorder ([place]); Fibromyalgia; Gastroparesis due to DM ([place]); Generalized osteoarthritis of multiple sites; Hepatic cirrhosis ([place]); Hyperlipemia; Lung nodule, multiple; Microalbuminuria; OSA (obstructive sleep apnea); Seronegative arthritis; Severe uncontrolled hypertension; L3-4 severe lumbar spinal stenosis (12/31/2016 MRI); Type 2 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, with long-term current use of insulin ([place]); Unilateral small kidney; Long term current use of opiate analgesic; Long term prescription opiate use; Opiate use; Chronic pain syndrome; Morbid obesity with BMI of 40.0-44.9, adult ([place]); Type 2 diabetes mellitus with diabetic nephropathy, with long-term current use of insulin ([place]); Chronic low back pain (Location of Primary Source of Pain) (Bilateral) (R>L); 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 Her primarily concern today is the Back Pain (lower)  Pain Assessment: Location: Lower Back Radiating: down right leg to kneee, right leg numb since surgery Onset: More than a month ago Duration: Chronic pain Quality: Aching, Constant Severity: 3 /10 (self-reported pain score)  Note: Reported level is compatible with observation.                   When using our objective Pain Scale, levels between 6 and 10/10 are said to belong in an emergency room, as it progressively worsens from a 6/10, described as severely limiting, requiring emergency care not usually available at an outpatient pain management facility. At a 6/10 level, communication becomes difficult and requires great effort. Assistance to reach the emergency department may be required. Facial flushing and profuse sweating along with potentially dangerous increases in heart rate and blood pressure will be evident. Effect on ADL:   Timing: Intermittent Modifying factors: rest, medicaiotn  Brittney [name] comes in today for post-procedure evaluation after the treatment done on 10/12/2017.  Further details on both, my assessment(s), as well as the proposed treatment plan, please see below.  Post-Procedure Assessment  10/12/2017 Procedure:  Fluoroscopically guided placement of Spinal Cord Stimulator for NEVRO trial. : Lead 1: superior endplate of T8    Lead 2:mid vertebral body of T9  Trial lead kit 1 Lot: [id number], expiration 07/07/2020, thoracic lead: 1058-50 B Each trial lead kit contains 18 contact 5 mm blue percutaneous lead, 50 cm  Trial lead kit 2 lot: [id number], expiration 02/02/2020, thoracic lead: 1058-50 B Each trial lead kit contains 18  contact 5 mm blue percutaneous  lead, 50 cm   Patient returns today for removal of spinal cord stimulator trial leads. Trial leads were placed on 10/12/2017. Patient has had a successful 6 day trial. Patient notes approximately 70% improvement in right leg pain and approximately 70% improvement in axial low back pain as a result of the SCS trial. Patient states that she was able to sleep better, perform activities of daily living with greater ease and less pain. Patient is pleased with the trial and states that although it did not eliminate all of her pain, she is pleased with the pain relief that she did obtain from it and the improvement in functional status that she saw during the trial period.  Prior to removal of trial SCS leads, fluoroscopy was utilized to confirm that leads had not migrated. Leads pretty much remained at the superior endplate of T8 in the mid body of T9 throughout the trial.  Spinal cord stimulator leads were removed without any problem, tips intact and verified to nurse.  Because patient has had a successful SCS trial and wants to proceed with permanent implant, I will send her to Brittney. Cari Caraway with neurosurgery to discuss surgical implantation of Nevro spinal cord stimulator.  Interpretation: Results would suggest a successful diagnostic intervention.                  Plan:  Please see "Plan of Care" for details.        Laboratory Chemistry  Inflammation Markers (CRP: Acute Phase) (ESR: Chronic Phase) Lab Results  Component Value Date   CRP 1.2 (H) 12/07/2016   ESRSEDRATE 56 (H) 12/07/2016                 Renal Function Markers Lab Results  Component Value Date   BUN 13 12/07/2016   CREATININE 0.66 12/07/2016   GFRAA >60 12/07/2016   GFRNONAA >60 12/07/2016                 Hepatic Function Markers Lab Results  Component Value Date   AST 34 12/07/2016   ALT 20 12/07/2016   ALBUMIN 3.7 12/07/2016   ALKPHOS 106 12/07/2016                  Electrolytes Lab Results  Component Value Date   NA 139 12/07/2016   K 3.7 12/07/2016   CL 103 12/07/2016   CALCIUM 9.9 12/07/2016   MG 1.8 12/07/2016                 Neuropathy Markers Lab Results  Component Value Date   VITAMINB12 787 12/07/2016                 Bone Pathology Markers Lab Results  Component Value Date   ALKPHOS 106 12/07/2016   25OHVITD1 26 (L) 12/07/2016   25OHVITD2 1.2 12/07/2016   25OHVITD3 25 12/07/2016   CALCIUM 9.9 12/07/2016                 Coagulation Parameters Lab Results  Component Value Date   INR 0.95 09/20/2016   LABPROT 12.7 09/20/2016   APTT 34.9 10/22/2014   PLT 188 10/05/2017                 Cardiovascular Markers Lab Results  Component Value Date   HGB 12.3 10/05/2017   HCT 37.7 10/05/2017                 Note: Lab results reviewed.  Recent Diagnostic Imaging Results  DG C-Arm 1-60 Min-No Report Fluoroscopy was utilized by the requesting physician.  No radiographic  interpretation.   Complexity Note: Imaging results reviewed. Results shared with Brittney [name], using Layman's terms.                         Meds   Current Outpatient Prescriptions:  .  albuterol (VENTOLIN HFA) 108 (90 Base) MCG/ACT inhaler, TAKE 2 PUFFS BY MOUTH 3 TIMES A DAY AS NEEDED, Disp: , Rfl:  .  ALPRAZolam (XANAX) 0.5 MG tablet, Take 0.5 mg by mouth. , Disp: , Rfl:  .  bumetanide (BUMEX) 1 MG tablet, Take by mouth., Disp: , Rfl:  .  butalbital-acetaminophen-caffeine (FIORICET, ESGIC) 50-325-40 MG tablet, TAKE ONE TABLET BY MOUTH 2 TIMES A DAY AS NEEDED, Disp: , Rfl:  .  candesartan (ATACAND) 32 MG tablet, Take 1 tablet (32 mg total) by mouth daily., Disp: 30 tablet, Rfl: 11 .  carvedilol (COREG) 25 MG tablet, Take by mouth., Disp: , Rfl:  .  cephALEXin (KEFLEX) 500 MG capsule, TAKE 1 CAPSULE (500 MG TOTAL) BY MOUTH 4 (FOUR) TIMES DAILY for 7 days (duration of trial), Disp: 28 capsule, Rfl: 0 .  cloNIDine (CATAPRES - DOSED IN MG/24 HR) 0.2  mg/24hr patch, 0.3 patches. Frequency:1XW   Dosage:0.0     Instructions:  Note:Dose: 0.2MG/24H, Disp: , Rfl:  .  diclofenac sodium (VOLTAREN) 1 % GEL, Apply topically as needed. , Disp: , Rfl:  .  doxazosin (CARDURA) 4 MG tablet, Take 4 mg by mouth daily. , Disp: , Rfl:  .  escitalopram (LEXAPRO) 10 MG tablet, Take 10 mg by mouth daily. , Disp: , Rfl:  .  fluticasone (FLONASE) 50 MCG/ACT nasal spray, PLACE 2 SPRAYS INTO BOTH NOSTRILS 2 (TWO) TIMES DAILY., Disp: , Rfl:  .  hydrALAZINE (APRESOLINE) 25 MG tablet, Take 25 mg by mouth daily. , Disp: , Rfl:  .  hydrocortisone 2.5 % cream, APPLY TOPICALLY 2 (TWO) TIMES DAILY FOR 10 DAYS., Disp: , Rfl:  .  ibuprofen (ADVIL,MOTRIN) 800 MG tablet, Take 800 mg by mouth 3 (three) times daily. , Disp: , Rfl:  .  Insulin Detemir (LEVEMIR FLEXTOUCH) 100 UNIT/ML Pen, INJECT 90 UNITS SUBCUTANEOUSLY TWICE A DAY, Disp: , Rfl:  .  Insulin Pen Needle (FIFTY50 PEN NEEDLES) 32G X 6 MM MISC, 5 (five) times daily., Disp: , Rfl:  .  insulin regular human CONCENTRATED (HUMULIN R) 500 UNIT/ML kwikpen, Inject 70 Units into the skin 3 (three) times daily with meals. , Disp: , Rfl:  .  ketoconazole (NIZORAL) 2 % shampoo, APPLY TO AFFECTED AREAS (LEAVE ON FOR 5 MINUTES) , RINSE WELL, USE 2-3 TIMES PER WEEK., Disp: , Rfl: 11 .  lidocaine (LIDODERM) 5 %, PLACE 1 PATCH ONTO THE MOST PAINFUL AREA OF SKIN DAILY FOR UP TO 12 HOURS IN A 24 HOUR PERIOD, Disp: , Rfl: 5 .  metoCLOPramide (REGLAN) 10 MG tablet, Take 10 mg by mouth 3 (three) times daily., Disp: , Rfl:  .  ondansetron (ZOFRAN) 4 MG tablet, Take 4 mg by mouth every 8 (eight) hours as needed for nausea. , Disp: , Rfl:  .  pantoprazole (PROTONIX) 40 MG tablet, TAKE 1 TABLET (40 MG TOTAL) BY MOUTH 2 (TWO) TIMES DAILY. TAKE ONE HOUR BEFORE A MEAL, Disp: , Rfl:  .  potassium chloride (K-DUR,KLOR-CON) 10 MEQ tablet, TAKE 1 TABLET BY MOUTH ONCE A DAY, Disp: , Rfl:  .  pramipexole (MIRAPEX) 0.5 MG tablet,  TAKE 1 TABLET (0.5 MG TOTAL)  BY MOUTH 2 (TWO) TIMES DAILY., Disp: , Rfl:  .  traZODone (DESYREL) 150 MG tablet, Take 150 mg by mouth at bedtime. , Disp: , Rfl:  .  triamcinolone ointment (KENALOG) 0.1 %, APPLY TO AFFECTED AREA AT BEDTIME, Disp: , Rfl:  .  zolpidem (AMBIEN) 10 MG tablet, Take 10 mg by mouth at bedtime. , Disp: , Rfl:  .  cyclobenzaprine (FLEXERIL) 10 MG tablet, Take 1 tablet (10 mg total) by mouth 3 (three) times daily as needed for muscle spasms., Disp: 270 tablet, Rfl: 0 .  fluconazole (DIFLUCAN) 150 MG tablet, Take 1 tablet (150 mg total) by mouth once., Disp: 1 tablet, Rfl: 0 .  gabapentin (NEURONTIN) 600 MG tablet, Take 1 tablet (600 mg total) by mouth 6 (six) times daily. (Patient taking differently: Take 600 mg by mouth 5 (five) times daily. ), Disp: 540 tablet, Rfl: 0 .  traMADol (ULTRAM) 50 MG tablet, Take 2 tablets (100 mg total) by mouth every 8 (eight) hours., Disp: 180 tablet, Rfl: 2  ROS  Constitutional: Denies any fever or chills Gastrointestinal: No reported hemesis, hematochezia, vomiting, or acute GI distress Musculoskeletal: Denies any acute onset joint swelling, redness, loss of ROM, or weakness Neurological: No reported episodes of acute onset apraxia, aphasia, dysarthria, agnosia, amnesia, paralysis, loss of coordination, or loss of consciousness  Allergies  Brittney [name] is allergic to amlodipine; imdur [isosorbide dinitrate]; lyrica [pregabalin]; monosodium glutamate; shellfish allergy; tape; and eggs or egg-derived products.  Brittney [name]  Drug: Brittney [name]  reports that she does not use drugs. Alcohol:  reports that she does not drink alcohol. Tobacco:  reports that she has never smoked. She has never used smokeless tobacco. Medical:  has a past medical history of Abdominal pain, left upper quadrant (12/11/2012); Arthritis; Asthma; Cancer ([place]); Collagen vascular disease ([place]); Diabetes mellitus without complication ([place]); Diabetic nephropathy associated with secondary diabetes mellitus  ([place]); [place] (12/11/2012); Gastroparesis; Gross hematuria (12/11/2012); Hepatic cirrhosis ([place]); Hyperlipidemia; Hypertension; Kidney stone (12/11/2012); Morbid obesity with BMI of 40.0-44.9, adult ([place]); Nausea without vomiting (12/11/2012); Nephrolithiasis; Nephrolithiasis (04/26/2014); Nonproliferative retinopathy due to secondary diabetes ([place]); Peripheral vascular disease ([place]); Renal colic (16/09/9603); Sciatica; Sciatica (12/11/2012); Skin cancer; Sleep apnea; and Unilateral small kidney without contralateral hypertrophy. Surgical: Brittney [name]  has a past surgical history that includes Cesarean section (1980); Dilation and curettage of uterus; Eye surgery (1986); Abdominal hysterectomy; Joint replacement (Left, 09/24/2013); Back surgery (04/1999); Tonsillectomy; Esophagogastroduodenoscopy (02/08/2014); Colonoscopy (05/04/2001); Esophagogastroduodenoscopy (egd) with propofol (N/A, 09/20/2016); Colonoscopy with propofol (N/A, 09/20/2016); and Mohs surgery (11/2016). Family: family history includes Asthma in her mother; Cancer in her mother; Diabetes in her father; Heart disease in her mother; Hypertension in her father; Kidney disease in her father.  Constitutional Exam  General appearance: Well nourished, well developed, and well hydrated. In no apparent acute distress Vitals:   10/18/17 1317  BP: 140/78  Pulse: 75  Resp: 16  Temp: 98.6 F (37 C)  SpO2: 96%  Weight: 279 lb (126.6 kg)  Height: _0  (1.676 m)   BMI Assessment: Estimated body mass index is 45.03 kg/m as calculated from the following:   Height as of this encounter: _1  (1.676 m).   Weight as of this encounter: 279 lb (126.6 kg).  BMI interpretation table: BMI level Category Range association with higher incidence of chronic pain  <18 kg/m2 Underweight   18.5-24.9 kg/m2 Ideal body weight   25-29.9 kg/m2 Overweight Increased incidence by 20%  30-34.9 kg/m2 Obese (  Class I) Increased incidence by 68%  35-39.9 kg/m2 Severe  obesity (Class II) Increased incidence by 136%  >40 kg/m2 Extreme obesity (Class III) Increased incidence by 254%   BMI Readings from Last 4 Encounters:  10/18/17 45.03 kg/m  10/12/17 45.35 kg/m  10/05/17 45.50 kg/m  09/30/17 45.52 kg/m   Wt Readings from Last 4 Encounters:  10/18/17 279 lb (126.6 kg)  10/12/17 281 lb (127.5 kg)  10/05/17 281 lb 14.4 oz (127.9 kg)  09/30/17 282 lb (127.9 kg)  Psych/Mental status: Alert, oriented x 3 (person, place, & time)       Eyes: PERLA Respiratory: No evidence of acute respiratory distress  Cervical Spine Area Exam  Skin & Axial Inspection: No masses, redness, edema, swelling, or associated skin lesions Alignment: Symmetrical Functional ROM: Unrestricted ROM      Stability: No instability detected Muscle Tone/Strength: Functionally intact. No obvious neuro-muscular anomalies detected. Sensory (Neurological): Unimpaired Palpation: No palpable anomalies              Upper Extremity (UE) Exam    Side: Right upper extremity  Side: Left upper extremity  Skin & Extremity Inspection: Skin color, temperature, and hair growth are WNL. No peripheral edema or cyanosis. No masses, redness, swelling, asymmetry, or associated skin lesions. No contractures.  Skin & Extremity Inspection: Skin color, temperature, and hair growth are WNL. No peripheral edema or cyanosis. No masses, redness, swelling, asymmetry, or associated skin lesions. No contractures.  Functional ROM: Unrestricted ROM          Functional ROM: Unrestricted ROM          Muscle Tone/Strength: Functionally intact. No obvious neuro-muscular anomalies detected.  Muscle Tone/Strength: Functionally intact. No obvious neuro-muscular anomalies detected.  Sensory (Neurological): Unimpaired          Sensory (Neurological): Unimpaired          Palpation: No palpable anomalies              Palpation: No palpable anomalies              Specialized Test(s): Deferred         Specialized Test(s): Deferred           Thoracic Spine Area Exam  Skin & Axial Inspection: No masses, redness, or swelling Alignment: Symmetrical Functional ROM: Unrestricted ROM Stability: No instability detected Muscle Tone/Strength: Functionally intact. No obvious neuro-muscular anomalies detected. Sensory (Neurological): Unimpaired Muscle strength & Tone: No palpable anomalies  Lumbar Spine Area Exam  Skin & Axial Inspection:No masses, redness, or swelling Alignment:Symmetrical Functional XIP:[id number] ROM Stability:No instability detected Muscle Tone/Strength:Functionally intact. No obvious neuro-muscular anomalies detected. Sensory (Neurological):Movement-associated pain Palpation:Complains of area being tender to palpation Provocative Tests: Lumbar Hyperextension and rotation test:Positive Lumbar Lateral bending test:Negative Patrick's Maneuver:evaluation deferred today  Gait & Posture Assessment  Ambulation:Patient ambulates using a walker Gait:Very limited, using assistive device to ambulate Posture:Difficulty standing up straight, due to pain  Lower Extremity Exam    Side:Right lower extremity  Side:Left lower extremity  Skin & Extremity Inspection:Atrophy, obese   Skin & Extremity Inspection:Skin color, temperature, and hair growth are WNL. No peripheral edema or cyanosis. No masses, redness, swelling, asymmetry, or associated skin lesions. No contractures.  Functional QBH:[id number] ROM  Functional XBD:[id number] ROM  Muscle Tone/Strength:Right lower extremity 3-/5 hip flexion, 4-/5 knee extension, knee flexion, 4/5 plantar flexion dorsiflexion  Muscle Tone/Strength:Functionally intact. No obvious neuro-muscular anomalies detected.  Sensory (Neurological):Neuropathic And dermatomal pain pattern  Sensory (Neurological):Unimpaired  Palpation:Tender  Palpation:No palpable anomalies  Assessment   Primary Diagnosis & Pertinent Problem List: The primary encounter diagnosis was Failed back surgical syndrome. Diagnoses of Status post lumbar spine surgery for decompression of spinal cord, Chronic lower extremity pain (Location of Secondary source of pain) (Bilateral) (R>L), L3-4 severe lumbar facet hypertrophy and spinal stenosis, L3-4 severe lumbar spinal stenosis (12/31/2016 MRI), Chronic pain syndrome, and Chronic low back pain (Location of Primary Source of Pain) (Bilateral) (R>L) were also pertinent to this visit.  Status Diagnosis  Stable Stable Stable 1. Failed back surgical syndrome   2. Status post lumbar spine surgery for decompression of spinal cord   3. Chronic lower extremity pain (Location of Secondary source of pain) (Bilateral) (R>L)   4. L3-4 severe lumbar facet hypertrophy and spinal stenosis   5. L3-4 severe lumbar spinal stenosis (12/31/2016 MRI)   6. Chronic pain syndrome   7. Chronic low back pain (Location of Primary Source of Pain) (Bilateral) (R>L)      66 year old female status post lumbar spine fusion with chronic neuropathic axial low back and right leg radicular pain who returns today for removal of spinal cord stimulator trial leads. Trial leads were placed on 10/12/2017. Patient has had a successful 6 day trial. Patient notes approximately 70% improvement in right leg pain and approximately 70% improvement in axial low back pain as a result of the SCS trial. Patient states that she was able to sleep better, perform activities of daily living with greater ease and less pain. Patient is pleased with the trial and states that although it did not eliminate all of her pain, she is pleased with the pain relief that she did obtain from it and the improvement in functional status that she saw during the trial period.  Prior to removal of trial SCS leads, fluoroscopy was utilized to confirm that leads had not migrated. Leads pretty much remained at the superior endplate of  T8 in the mid body of T9 throughout the trial.  Spinal cord stimulator leads were removed without any problem, tips intact and verified to nurse.  Because patient has had a successful SCS trial and wants to proceed with permanent implant, I will send her to Brittney. Cari Caraway with neurosurgery to discuss surgical implantation of Nevro spinal cord stimulator.  I will also prescribe the patient Diflucan 150 mg by mouth 1 for yeast infection that she is endorsing (patient was on Bactrim 500 mg 4 times a day for 6 days during the course of her trial)    Lead 1: superior endplate of T8    Lead 2:mid vertebral body of T9  10/17- SCS Trial Placement/Start                  10/18/17- SCS Lead pull   Plan of Care  Pharmacotherapy (Medications Ordered): Meds ordered this encounter  Medications  . fluconazole (DIFLUCAN) 150 MG tablet    Sig: Take 1 tablet (150 mg total) by mouth once.    Dispense:  1 tablet    Refill:  0   Lab-work, procedure(s), and/or referral(s): Orders Placed This Encounter  Procedures  . DG C-Arm 1-60 Min-No Report  . Ambulatory referral to Neurosurgery    Provider-requested follow-up: Return if symptoms worsen or fail to improve.  Future Appointments Date Time Provider [place]  11/08/2017 3:30 PM [facility] None  11/11/2017 2:30 PM Wilhelmina Mcardle, MD [facility] None  02/08/2018 1:00 PM [facility] LAB [facility] None  02/08/2018 1:30 PM Lloyd Huger, MD [facility] None  02/08/2018  2:00 PM [facility] INFUSION CHAIR 3 [facility] None   Time Note: Greater than 50% of the 40 minute(s) of face-to-face time spent with Brittney [name], was spent in counseling/coordination of care regarding: the treatment plan, realistic expectations and the goals of pain management (increased in functionality).  Primary Care Physician: Rusty Aus, MD Location: [facility] Note by: [name], M.D Date: 10/18/2017; Time: 3:21 PM  Patient  Instructions  Pain Management Discharge Instructions  General Discharge Instructions :  If you need to reach your doctor call: Monday-Friday 8:00 am - 4:00 pm at [phone] or toll free [phone].  After clinic hours [phone] to have operator reach doctor.  Bring all of your medication bottles to all your appointments in the pain clinic.  To cancel or reschedule your appointment with Pain Management please remember to call 24 hours in advance to avoid a fee.  Refer to the educational materials which you have been given on: General Risks, I had my Procedure. Discharge Instructions, Post Sedation.   Call our office to follow up after Spinal Cord Stimulator placement for Brittney [name] to manage

## 2017-10-18 NOTE — Patient Instructions (Signed)
Pain Management Discharge Instructions  General Discharge Instructions :  If you need to reach your doctor call: Monday-Friday 8:00 am - 4:00 pm at [phone] or toll free [phone].  After clinic hours [phone] to have operator reach doctor.  Bring all of your medication bottles to all your appointments in the pain clinic.  To cancel or reschedule your appointment with Pain Management please remember to call 24 hours in advance to avoid a fee.  Refer to the educational materials which you have been given on: General Risks, I had my Procedure. Discharge Instructions, Post Sedation.   Call our office to follow up after Spinal Cord Stimulator placement for Dr Charlean Sanfilippo to manage

## 2017-10-18 NOTE — Progress Notes (Signed)
Safety precautions to be maintained throughout the outpatient stay will include: orient to surroundings, keep bed in low position, maintain call bell within reach at all times, provide assistance with transfer out of bed and ambulation.   SCS leads pulled, tips intact.  After tape removed large red rash noted.

## 2017-10-19 ENCOUNTER — Ambulatory Visit: Payer: Medicare Other | Admitting: Student in an Organized Health Care Education/Training Program

## 2017-10-28 ENCOUNTER — Encounter
Admission: RE | Admit: 2017-10-28 | Discharge: 2017-10-28 | Disposition: A | Discharge status: Home / Self Care | Payer: Medicare Other | Source: Ambulatory Visit | Attending: Neurosurgery | Admitting: Neurosurgery

## 2017-10-28 DIAGNOSIS — I252 Old myocardial infarction: Secondary | ICD-10-CM | POA: Insufficient documentation

## 2017-10-28 DIAGNOSIS — Z01812 Encounter for preprocedural laboratory examination: Secondary | ICD-10-CM | POA: Diagnosis not present

## 2017-10-28 DIAGNOSIS — Z01810 Encounter for preprocedural cardiovascular examination: Secondary | ICD-10-CM | POA: Insufficient documentation

## 2017-10-28 HISTORY — DX: Other chronic pain: G89.29

## 2017-10-28 HISTORY — DX: Anesthesia of skin: R20.2

## 2017-10-28 HISTORY — DX: Low back pain, unspecified: M54.50

## 2017-10-28 HISTORY — DX: Localized edema: R60.0

## 2017-10-28 HISTORY — DX: Anesthesia of skin: R20.0

## 2017-10-28 HISTORY — DX: Atherosclerotic heart disease of native coronary artery without angina pectoris: I25.10

## 2017-10-28 HISTORY — DX: Low back pain: M54.5

## 2017-10-28 HISTORY — DX: Irritable bowel syndrome, unspecified: K58.9

## 2017-10-28 HISTORY — DX: Acute myocardial infarction, unspecified: I21.9

## 2017-10-28 LAB — COMPREHENSIVE METABOLIC PANEL
ALBUMIN: 3.4 g/dL — AB (ref 3.5–5.0)
ALT: 24 U/L (ref 14–54)
ANION GAP: 8 (ref 5–15)
AST: 29 U/L (ref 15–41)
Alkaline Phosphatase: 103 U/L (ref 38–126)
BUN: 14 mg/dL (ref 6–20)
CHLORIDE: 101 mmol/L (ref 101–111)
CO2: 28 mmol/L (ref 22–32)
Calcium: 9.5 mg/dL (ref 8.9–10.3)
Creatinine, Ser: 0.6 mg/dL (ref 0.44–1.00)
GFR calc non Af Amer: >60 mL/min (ref >60)
Glucose, Bld: 204 mg/dL — ABNORMAL HIGH (ref 65–99)
Potassium: 3.8 mmol/L (ref 3.5–5.1)
SODIUM: 137 mmol/L (ref 135–145)
Total Bilirubin: 0.6 mg/dL (ref 0.3–1.2)
Total Protein: 7 g/dL (ref 6.5–8.1)

## 2017-10-28 LAB — SURGICAL PCR SCREEN
MRSA, PCR: NEGATIVE
STAPHYLOCOCCUS AUREUS: NEGATIVE

## 2017-10-28 NOTE — Patient Instructions (Signed)
Your procedure is scheduled on: 11/02/17 Wed Report to Same Day Surgery 2nd floor medical mall [facility] Entrance-take elevator on left to 2nd floor.  Check in with surgery information desk.) To find out your arrival time please call [phone] between 1PM - 3PM on 11/01/17 Tues  Remember: Instructions that are not followed completely may result in serious medical risk, up to and including death, or upon the discretion of your surgeon and anesthesiologist your surgery may need to be rescheduled.    _x___ 1. Do not eat food after midnight the night before your procedure. You may drink clear liquids up to 2 hours before you are scheduled to arrive at the hospital for your procedure.  Do not drink clear liquids within 2 hours of your scheduled arrival to the hospital.  Clear liquids include  --Water or Apple juice without pulp  --Clear carbohydrate beverage such as ClearFast or Gatorade  --Black Coffee or Clear Tea (No milk, no creamers, do not add anything to                  the coffee or Tea Type 1 and type 2 diabetics should only drink water.  No gum chewing or hard candies.     __x__ 2. No Alcohol for 24 hours before or after surgery.   __x__3. No Smoking for 24 prior to surgery.   ____  4. Bring all medications with you on the day of surgery if instructed.    __x__ 5. Notify your doctor if there is any change in your medical condition     (cold, fever, infections).     Do not wear jewelry, make-up, hairpins, clips or nail polish.  Do not wear lotions, powders, or perfumes. You may wear deodorant.  Do not shave 48 hours prior to surgery. Men may shave face and neck.  Do not bring valuables to the hospital.    [facility] is not responsible for any belongings or valuables.               Contacts, dentures or bridgework may not be worn into surgery.  Leave your suitcase in the car. After surgery it may be brought to your room.  For patients admitted to the hospital, discharge  time is determined by your                       treatment team.   Patients discharged the day of surgery will not be allowed to drive home.  You will need someone to drive you home and stay with you the night of your procedure.    Please read over the following fact sheets that you were given:   [facility] Preparing for Surgery and or MRSA Information   _x___ Take anti-hypertensive listed below, cardiac, seizure, asthma,     anti-reflux and psychiatric medicines. These include:  1. albuterol (VENTOLIN HFA  2.ALPRAZolam (XANAX) 0.5 MG tablet  3.carvedilol (COREG) 25 MG tablet  4.escitalopram (LEXAPRO) 10 MG tablet  5.pantoprazole (PROTONIX) 40 MG tablet  6.  ____Fleets enema or Magnesium Citrate as directed.   _x___ Use CHG Soap or sage wipes as directed on instruction sheet   ____ Use inhalers on the day of surgery and bring to hospital day of surgery  ____ Stop Metformin and Janumet 2 days prior to surgery.    _x___ Take 1/2 of usual insulin dose the night before surgery and none on the morning     surgery.  _x___ Follow recommendations from Cardiologist, Pulmonologist or PCP regarding          stopping Aspirin, Coumadin, Plavix ,Eliquis, Effient, or Pradaxa, and Pletal.  X____Stop Anti-inflammatories such as Advil, Aleve, Ibuprofen, Motrin, Naproxen, Naprosyn, Goodies powders or aspirin products. OK to take Tylenol and                          Celebrex.   _x___ Stop supplements until after surgery.  But may continue Vitamin D, Vitamin B,       and multivitamin.   ____ Bring C-Pap to the hospital.

## 2017-10-28 NOTE — Pre-Procedure Instructions (Signed)
55   Aortic Valve Stenosis Grade none   Aortic Valve Regurgitation Grade none   Aortic Valve Max Velocity (m/s) 1.7 m/sec   Mitral Valve Stenosis Grade none   Mitral Valve Regurgitation Grade mild   Tricuspid Valve Regurgitation Grade mild   Tricuspid Valve Regurgitation Max Velocity (m/s) 2.2 m/sec   Right Ventricle Systolic Pressure (mmHg) 26.2 mmHg   LV End Diastolic Diameter (cm) 5.3 cm  LV End Systolic Diameter (cm) 3.7 cm  LV Septum Wall Thickness (cm) 1.3 cm  LV Posterior Wall Thickness (cm) 1.2 cm  Left Atrium Diameter (cm) 4.0 cm  Result Narrative  CARDIOLOGY DEPARTMENT DELBRA, ZELLARS [id number] [facility]Acct #: [id number] [address] [place], [place] [address]Date: 03/01/2017 11:52 AM Adult Female Age: 66 yrs ECHOCARDIOGRAM REPORT Outpatient KC::KCWC STUDY:CHEST WALL TAPE:MD1:CALLWOOD, DWAYNE DENNIS  ECHO:Yes DOPPLER:YesFILE:BP: 136/82 mmHg COLOR:YesCONTRAST:NoMACHINE:Philips Height: 64 in RV BIOPSY:No 3D:No SOUND QLTY:ModerateWeight: 284 lb  MEDIUM:NoneBSA: 2.3 m2  ___________________________________________________________________________________________ HISTORY:DOE  REASON:Assess, LV function  INDICATION:SOB (shortness of breath) [R06.02 (ICD-10-CM)];

## 2017-10-29 NOTE — Pre-Procedure Instructions (Signed)
EKG sent to Anesthesia for review. 

## 2017-11-01 MED ORDER — DEXTROSE 5 % IV SOLN
3.0000 g | Freq: Once | INTRAVENOUS | Status: AC
  Administered 2017-11-02: 3 g via INTRAVENOUS
  Filled 2017-11-01: qty 3000

## 2017-11-02 ENCOUNTER — Observation Stay
Admission: RE | Admit: 2017-11-02 | Discharge: 2017-11-03 | Disposition: A | Discharge status: Home / Self Care | Payer: Medicare Other | Source: Ambulatory Visit | Attending: Neurosurgery | Admitting: Neurosurgery

## 2017-11-02 ENCOUNTER — Ambulatory Visit: Payer: Medicare Other

## 2017-11-02 ENCOUNTER — Ambulatory Visit: Payer: Medicare Other | Admitting: Anesthesiology

## 2017-11-02 ENCOUNTER — Other Ambulatory Visit: Payer: Self-pay

## 2017-11-02 ENCOUNTER — Encounter
Admission: RE | Disposition: A | Discharge status: Home / Self Care | Payer: Self-pay | Source: Ambulatory Visit | Attending: Neurosurgery

## 2017-11-02 ENCOUNTER — Encounter: Payer: Self-pay | Admitting: *Deleted

## 2017-11-02 DIAGNOSIS — Z833 Family history of diabetes mellitus: Secondary | ICD-10-CM | POA: Insufficient documentation

## 2017-11-02 DIAGNOSIS — Z794 Long term (current) use of insulin: Secondary | ICD-10-CM | POA: Diagnosis not present

## 2017-11-02 DIAGNOSIS — J449 Chronic obstructive pulmonary disease, unspecified: Secondary | ICD-10-CM | POA: Insufficient documentation

## 2017-11-02 DIAGNOSIS — K589 Irritable bowel syndrome without diarrhea: Secondary | ICD-10-CM | POA: Insufficient documentation

## 2017-11-02 DIAGNOSIS — Z91048 Other nonmedicinal substance allergy status: Secondary | ICD-10-CM | POA: Insufficient documentation

## 2017-11-02 DIAGNOSIS — Z91012 Allergy to eggs: Secondary | ICD-10-CM | POA: Diagnosis not present

## 2017-11-02 DIAGNOSIS — Z79899 Other long term (current) drug therapy: Secondary | ICD-10-CM | POA: Insufficient documentation

## 2017-11-02 DIAGNOSIS — Z8249 Family history of ischemic heart disease and other diseases of the circulatory system: Secondary | ICD-10-CM | POA: Insufficient documentation

## 2017-11-02 DIAGNOSIS — Z841 Family history of disorders of kidney and ureter: Secondary | ICD-10-CM | POA: Insufficient documentation

## 2017-11-02 DIAGNOSIS — Z9049 Acquired absence of other specified parts of digestive tract: Secondary | ICD-10-CM | POA: Diagnosis not present

## 2017-11-02 DIAGNOSIS — G4733 Obstructive sleep apnea (adult) (pediatric): Secondary | ICD-10-CM | POA: Diagnosis not present

## 2017-11-02 DIAGNOSIS — Z888 Allergy status to other drugs, medicaments and biological substances status: Secondary | ICD-10-CM | POA: Insufficient documentation

## 2017-11-02 DIAGNOSIS — Z85820 Personal history of malignant melanoma of skin: Secondary | ICD-10-CM | POA: Insufficient documentation

## 2017-11-02 DIAGNOSIS — Z87442 Personal history of urinary calculi: Secondary | ICD-10-CM | POA: Diagnosis not present

## 2017-11-02 DIAGNOSIS — K3184 Gastroparesis: Secondary | ICD-10-CM | POA: Diagnosis not present

## 2017-11-02 DIAGNOSIS — Z8261 Family history of arthritis: Secondary | ICD-10-CM | POA: Insufficient documentation

## 2017-11-02 DIAGNOSIS — I10 Essential (primary) hypertension: Secondary | ICD-10-CM | POA: Insufficient documentation

## 2017-11-02 DIAGNOSIS — M1990 Unspecified osteoarthritis, unspecified site: Secondary | ICD-10-CM | POA: Diagnosis not present

## 2017-11-02 DIAGNOSIS — M0680 Other specified rheumatoid arthritis, unspecified site: Secondary | ICD-10-CM | POA: Diagnosis not present

## 2017-11-02 DIAGNOSIS — Z90710 Acquired absence of both cervix and uterus: Secondary | ICD-10-CM | POA: Insufficient documentation

## 2017-11-02 DIAGNOSIS — Z887 Allergy status to serum and vaccine status: Secondary | ICD-10-CM | POA: Insufficient documentation

## 2017-11-02 DIAGNOSIS — Z8262 Family history of osteoporosis: Secondary | ICD-10-CM | POA: Insufficient documentation

## 2017-11-02 DIAGNOSIS — I252 Old myocardial infarction: Secondary | ICD-10-CM | POA: Diagnosis not present

## 2017-11-02 DIAGNOSIS — K7460 Unspecified cirrhosis of liver: Secondary | ICD-10-CM | POA: Insufficient documentation

## 2017-11-02 DIAGNOSIS — E1121 Type 2 diabetes mellitus with diabetic nephropathy: Secondary | ICD-10-CM | POA: Diagnosis not present

## 2017-11-02 DIAGNOSIS — E1143 Type 2 diabetes mellitus with diabetic autonomic (poly)neuropathy: Secondary | ICD-10-CM | POA: Diagnosis not present

## 2017-11-02 DIAGNOSIS — E11319 Type 2 diabetes mellitus with unspecified diabetic retinopathy without macular edema: Secondary | ICD-10-CM | POA: Insufficient documentation

## 2017-11-02 DIAGNOSIS — M810 Age-related osteoporosis without current pathological fracture: Secondary | ICD-10-CM | POA: Diagnosis not present

## 2017-11-02 DIAGNOSIS — K219 Gastro-esophageal reflux disease without esophagitis: Secondary | ICD-10-CM | POA: Diagnosis not present

## 2017-11-02 DIAGNOSIS — Z8349 Family history of other endocrine, nutritional and metabolic diseases: Secondary | ICD-10-CM | POA: Insufficient documentation

## 2017-11-02 DIAGNOSIS — M5416 Radiculopathy, lumbar region: Principal | ICD-10-CM | POA: Insufficient documentation

## 2017-11-02 DIAGNOSIS — M109 Gout, unspecified: Secondary | ICD-10-CM | POA: Insufficient documentation

## 2017-11-02 DIAGNOSIS — Z800 Family history of malignant neoplasm of digestive organs: Secondary | ICD-10-CM | POA: Insufficient documentation

## 2017-11-02 DIAGNOSIS — Z419 Encounter for procedure for purposes other than remedying health state, unspecified: Secondary | ICD-10-CM

## 2017-11-02 DIAGNOSIS — Z8614 Personal history of Methicillin resistant Staphylococcus aureus infection: Secondary | ICD-10-CM | POA: Insufficient documentation

## 2017-11-02 DIAGNOSIS — M549 Dorsalgia, unspecified: Secondary | ICD-10-CM | POA: Diagnosis present

## 2017-11-02 DIAGNOSIS — Z91013 Allergy to seafood: Secondary | ICD-10-CM | POA: Insufficient documentation

## 2017-11-02 HISTORY — DX: Unspecified fracture of unspecified lower leg, initial encounter for closed fracture: S82.90XA

## 2017-11-02 HISTORY — PX: PULSE GENERATOR IMPLANT: SHX5370

## 2017-11-02 LAB — GLUCOSE, CAPILLARY
GLUCOSE-CAPILLARY: 218 mg/dL — AB (ref 65–99)
GLUCOSE-CAPILLARY: 256 mg/dL — AB (ref 65–99)
Glucose-Capillary: 250 mg/dL — ABNORMAL HIGH (ref 65–99)
Glucose-Capillary: 276 mg/dL — ABNORMAL HIGH (ref 65–99)
Glucose-Capillary: 266 mg/dL — ABNORMAL HIGH (ref 65–99)

## 2017-11-02 SURGERY — UNILATERAL PULSE GENERATOR IMPLANT
Anesthesia: Choice | Site: Back | Wound class: Clean

## 2017-11-02 MED ORDER — GABAPENTIN 600 MG PO TABS: 600.0000 mg | ORAL_TABLET | Freq: Every day | ORAL | Status: DC

## 2017-11-02 MED ORDER — FENTANYL CITRATE (PF) 100 MCG/2ML IJ SOLN
INTRAMUSCULAR | Status: AC
  Administered 2017-11-02: 25 ug via INTRAVENOUS
  Filled 2017-11-02: qty 2

## 2017-11-02 MED ORDER — METOCLOPRAMIDE HCL 10 MG PO TABS
10.0000 mg | ORAL_TABLET | Freq: Three times a day (TID) | ORAL | Status: DC
  Administered 2017-11-02 – 2017-11-03 (×4): 10 mg via ORAL
  Filled 2017-11-02 (×4): qty 1

## 2017-11-02 MED ORDER — INSULIN DETEMIR 100 UNIT/ML ~~LOC~~ SOLN
90.0000 [IU] | Freq: Two times a day (BID) | SUBCUTANEOUS | Status: DC
  Administered 2017-11-02 – 2017-11-03 (×2): 90 [IU] via SUBCUTANEOUS
  Filled 2017-11-02 (×6): qty 0.9

## 2017-11-02 MED ORDER — ACETAMINOPHEN 10 MG/ML IV SOLN
INTRAVENOUS | Status: AC
  Filled 2017-11-02: qty 100

## 2017-11-02 MED ORDER — CEFAZOLIN SODIUM 1 G IJ SOLR
INTRAMUSCULAR | Status: AC
  Filled 2017-11-02: qty 10

## 2017-11-02 MED ORDER — PROPOFOL 10 MG/ML IV BOLUS
INTRAVENOUS | Status: AC
  Filled 2017-11-02: qty 40

## 2017-11-02 MED ORDER — PROPOFOL 10 MG/ML IV BOLUS
INTRAVENOUS | Status: DC | PRN
  Administered 2017-11-02: 140 mg via INTRAVENOUS

## 2017-11-02 MED ORDER — INSULIN REGULAR HUMAN (CONC) 500 UNIT/ML ~~LOC~~ SOPN: 70.0000 [IU] | PEN_INJECTOR | Freq: Three times a day (TID) | SUBCUTANEOUS | Status: DC

## 2017-11-02 MED ORDER — INSULIN DETEMIR 100 UNIT/ML FLEXPEN: 90.0000 [IU] | PEN_INJECTOR | Freq: Two times a day (BID) | SUBCUTANEOUS | Status: DC

## 2017-11-02 MED ORDER — MIDAZOLAM HCL 2 MG/2ML IJ SOLN
INTRAMUSCULAR | Status: AC
  Filled 2017-11-02: qty 2

## 2017-11-02 MED ORDER — ROCURONIUM BROMIDE 50 MG/5ML IV SOLN
INTRAVENOUS | Status: AC
  Filled 2017-11-02: qty 1

## 2017-11-02 MED ORDER — SODIUM CHLORIDE FLUSH 0.9 % IV SOLN
INTRAVENOUS | Status: AC
  Filled 2017-11-02: qty 10

## 2017-11-02 MED ORDER — BUTALBITAL-APAP-CAFFEINE 50-325-40 MG PO TABS: 1.0000 | ORAL_TABLET | Freq: Every day | ORAL | Status: DC

## 2017-11-02 MED ORDER — DEXAMETHASONE SODIUM PHOSPHATE 10 MG/ML IJ SOLN
INTRAMUSCULAR | Status: DC | PRN
  Administered 2017-11-02: 4 mg via INTRAVENOUS

## 2017-11-02 MED ORDER — HYDROMORPHONE HCL 1 MG/ML IJ SOLN
0.5000 mg | INTRAMUSCULAR | Status: AC | PRN
  Administered 2017-11-02 (×4): 0.5 mg via INTRAVENOUS

## 2017-11-02 MED ORDER — DEXMEDETOMIDINE HCL 200 MCG/2ML IV SOLN
INTRAVENOUS | Status: DC | PRN
  Administered 2017-11-02 (×2): 8 ug via INTRAVENOUS
  Administered 2017-11-02: 4 ug via INTRAVENOUS

## 2017-11-02 MED ORDER — SODIUM CHLORIDE 0.9% FLUSH: 3.0000 mL | INTRAVENOUS | Status: DC | PRN

## 2017-11-02 MED ORDER — INSULIN ASPART 100 UNIT/ML ~~LOC~~ SOLN
6.0000 [IU] | Freq: Once | SUBCUTANEOUS | Status: AC
  Administered 2017-11-02: 6 [IU] via SUBCUTANEOUS

## 2017-11-02 MED ORDER — GENTAMICIN SULFATE 10 MG/ML IJ SOLN
430.0000 mg | INTRAVENOUS | Status: DC
  Administered 2017-11-02: 430 mg via INTRAVENOUS
  Filled 2017-11-02: qty 10.75
  Filled 2017-11-02: qty 43
  Filled 2017-11-02: qty 10.75
  Filled 2017-11-02: qty 43

## 2017-11-02 MED ORDER — BUMETANIDE 1 MG PO TABS
1.0000 mg | ORAL_TABLET | Freq: Every day | ORAL | Status: DC
Start: 2017-11-02 — End: 2017-11-03
  Filled 2017-11-02 (×2): qty 1

## 2017-11-02 MED ORDER — CARVEDILOL 25 MG PO TABS: 37.5000 mg | ORAL_TABLET | Freq: Two times a day (BID) | ORAL | Status: DC

## 2017-11-02 MED ORDER — BUPIVACAINE-EPINEPHRINE (PF) 0.5% -1:200000 IJ SOLN
INTRAMUSCULAR | Status: DC | PRN
  Administered 2017-11-02: 20 mL

## 2017-11-02 MED ORDER — ACETAMINOPHEN 325 MG PO TABS: 650.0000 mg | ORAL_TABLET | ORAL | Status: DC | PRN

## 2017-11-02 MED ORDER — ONDANSETRON HCL 4 MG PO TABS
4.0000 mg | ORAL_TABLET | Freq: Four times a day (QID) | ORAL | Status: DC | PRN
Start: 2017-11-02 — End: 2017-11-03

## 2017-11-02 MED ORDER — SUCCINYLCHOLINE CHLORIDE 20 MG/ML IJ SOLN
INTRAMUSCULAR | Status: DC | PRN
  Administered 2017-11-02: 100 mg via INTRAVENOUS

## 2017-11-02 MED ORDER — EVICEL 2 ML EX KIT
PACK | CUTANEOUS | Status: DC | PRN
  Administered 2017-11-02: 2 mL

## 2017-11-02 MED ORDER — HYDRALAZINE HCL 25 MG PO TABS
25.0000 mg | ORAL_TABLET | Freq: Every day | ORAL | Status: DC
  Administered 2017-11-02: 25 mg via ORAL
  Filled 2017-11-02: qty 1

## 2017-11-02 MED ORDER — VANCOMYCIN HCL 10 G IV SOLR
1250.0000 mg | INTRAVENOUS | Status: AC
  Administered 2017-11-02: 1250 mg via INTRAVENOUS
  Filled 2017-11-02: qty 1250

## 2017-11-02 MED ORDER — BACITRACIN 50000 UNITS IM SOLR
INTRAMUSCULAR | Status: DC | PRN
  Administered 2017-11-02: 1000 mL

## 2017-11-02 MED ORDER — DEXAMETHASONE SODIUM PHOSPHATE 10 MG/ML IJ SOLN
INTRAMUSCULAR | Status: AC
  Filled 2017-11-02: qty 1

## 2017-11-02 MED ORDER — PRAMIPEXOLE DIHYDROCHLORIDE 0.25 MG PO TABS
1.0000 mg | ORAL_TABLET | Freq: Every day | ORAL | Status: DC
  Administered 2017-11-02: 1 mg via ORAL
  Filled 2017-11-02: qty 4

## 2017-11-02 MED ORDER — FENTANYL CITRATE (PF) 100 MCG/2ML IJ SOLN
INTRAMUSCULAR | Status: AC
  Filled 2017-11-02: qty 2

## 2017-11-02 MED ORDER — THROMBIN (RECOMBINANT) 5000 UNITS EX SOLR
CUTANEOUS | Status: AC
  Filled 2017-11-02: qty 5000

## 2017-11-02 MED ORDER — SODIUM CHLORIDE 0.9 % IV SOLN: 250.0000 mL | INTRAVENOUS | Status: DC

## 2017-11-02 MED ORDER — METHOCARBAMOL 1000 MG/10ML IJ SOLN
500.0000 mg | Freq: Four times a day (QID) | INTRAVENOUS | Status: DC | PRN
  Filled 2017-11-02: qty 5

## 2017-11-02 MED ORDER — ONDANSETRON HCL 4 MG/2ML IJ SOLN: 4.0000 mg | Freq: Once | INTRAMUSCULAR | Status: DC | PRN

## 2017-11-02 MED ORDER — OXYCODONE HCL 5 MG PO TABS
10.0000 mg | ORAL_TABLET | ORAL | Status: DC | PRN
  Administered 2017-11-02 – 2017-11-03 (×4): 10 mg via ORAL
  Filled 2017-11-02 (×5): qty 2

## 2017-11-02 MED ORDER — ACETAMINOPHEN 650 MG RE SUPP: 650.0000 mg | RECTAL | Status: DC | PRN

## 2017-11-02 MED ORDER — DEXMEDETOMIDINE HCL IN NACL 80 MCG/20ML IV SOLN
INTRAVENOUS | Status: AC
  Filled 2017-11-02: qty 20

## 2017-11-02 MED ORDER — CEFAZOLIN SODIUM 1 G IJ SOLR
INTRAMUSCULAR | Status: AC
  Filled 2017-11-02: qty 20

## 2017-11-02 MED ORDER — PROPOFOL 500 MG/50ML IV EMUL
INTRAVENOUS | Status: DC | PRN
  Administered 2017-11-02: 125 ug/kg/min via INTRAVENOUS

## 2017-11-02 MED ORDER — FENTANYL CITRATE (PF) 100 MCG/2ML IJ SOLN
25.0000 ug | INTRAMUSCULAR | Status: AC | PRN
Start: 2017-11-02 — End: 2017-11-02
  Administered 2017-11-02 (×6): 25 ug via INTRAVENOUS

## 2017-11-02 MED ORDER — HYDROMORPHONE HCL 1 MG/ML IJ SOLN
INTRAMUSCULAR | Status: AC
  Administered 2017-11-02: 0.5 mg via INTRAVENOUS
  Filled 2017-11-02: qty 1

## 2017-11-02 MED ORDER — ACETAMINOPHEN 500 MG PO TABS
1000.0000 mg | ORAL_TABLET | Freq: Four times a day (QID) | ORAL | Status: AC
  Administered 2017-11-02 – 2017-11-03 (×2): 1000 mg via ORAL
  Filled 2017-11-02 (×2): qty 2

## 2017-11-02 MED ORDER — SENNOSIDES-DOCUSATE SODIUM 8.6-50 MG PO TABS: 1.0000 | ORAL_TABLET | Freq: Every evening | ORAL | Status: DC | PRN

## 2017-11-02 MED ORDER — BUPIVACAINE-EPINEPHRINE (PF) 0.5% -1:200000 IJ SOLN
INTRAMUSCULAR | Status: AC
  Filled 2017-11-02: qty 30

## 2017-11-02 MED ORDER — PROPOFOL 500 MG/50ML IV EMUL
INTRAVENOUS | Status: AC
  Filled 2017-11-02: qty 50

## 2017-11-02 MED ORDER — FLUTICASONE PROPIONATE 50 MCG/ACT NA SUSP: 1.0000 | Freq: Every day | NASAL | Status: DC

## 2017-11-02 MED ORDER — CARVEDILOL 25 MG PO TABS
50.0000 mg | ORAL_TABLET | Freq: Every morning | ORAL | Status: DC
  Administered 2017-11-03: 50 mg via ORAL
  Filled 2017-11-02: qty 2

## 2017-11-02 MED ORDER — HYDROMORPHONE HCL 1 MG/ML IJ SOLN: 0.5000 mg | INTRAMUSCULAR | Status: DC | PRN

## 2017-11-02 MED ORDER — VANCOMYCIN HCL 1000 MG IV SOLR
INTRAVENOUS | Status: AC
  Filled 2017-11-02: qty 1000

## 2017-11-02 MED ORDER — DOXAZOSIN MESYLATE 4 MG PO TABS
4.0000 mg | ORAL_TABLET | Freq: Every day | ORAL | Status: DC
  Administered 2017-11-02: 4 mg via ORAL
  Filled 2017-11-02 (×2): qty 1

## 2017-11-02 MED ORDER — INSULIN NPH (HUMAN) (ISOPHANE) 100 UNIT/ML ~~LOC~~ SUSP: 70.0000 [IU] | Freq: Three times a day (TID) | SUBCUTANEOUS | Status: DC

## 2017-11-02 MED ORDER — CARVEDILOL 25 MG PO TABS
37.5000 mg | ORAL_TABLET | Freq: Every evening | ORAL | Status: DC
  Filled 2017-11-02: qty 1

## 2017-11-02 MED ORDER — SODIUM CHLORIDE 0.9 % IV SOLN
INTRAVENOUS | Status: DC
  Administered 2017-11-02: 10:00:00 via INTRAVENOUS
  Administered 2017-11-02: 20 mL/h via INTRAVENOUS

## 2017-11-02 MED ORDER — SODIUM CHLORIDE 0.9 % IV SOLN
INTRAVENOUS | Status: DC | PRN
  Administered 2017-11-02: 25 ug/min via INTRAVENOUS

## 2017-11-02 MED ORDER — GABAPENTIN 600 MG PO TABS
600.0000 mg | ORAL_TABLET | Freq: Three times a day (TID) | ORAL | Status: DC
  Administered 2017-11-02 – 2017-11-03 (×2): 600 mg via ORAL
  Filled 2017-11-02 (×2): qty 1

## 2017-11-02 MED ORDER — INSULIN ASPART 100 UNIT/ML ~~LOC~~ SOLN
SUBCUTANEOUS | Status: AC
  Filled 2017-11-02: qty 1

## 2017-11-02 MED ORDER — ALBUTEROL SULFATE (2.5 MG/3ML) 0.083% IN NEBU: 3.0000 mL | INHALATION_SOLUTION | Freq: Four times a day (QID) | RESPIRATORY_TRACT | Status: DC | PRN

## 2017-11-02 MED ORDER — ACETAMINOPHEN 10 MG/ML IV SOLN
1000.0000 mg | Freq: Once | INTRAVENOUS | Status: AC
  Administered 2017-11-02: 1000 mg via INTRAVENOUS

## 2017-11-02 MED ORDER — METHOCARBAMOL 500 MG PO TABS
500.0000 mg | ORAL_TABLET | Freq: Four times a day (QID) | ORAL | Status: DC | PRN
  Administered 2017-11-03: 500 mg via ORAL
  Filled 2017-11-02: qty 1

## 2017-11-02 MED ORDER — LIDOCAINE HCL (PF) 2 % IJ SOLN
INTRAMUSCULAR | Status: AC
  Filled 2017-11-02: qty 10

## 2017-11-02 MED ORDER — LIDOCAINE HCL (CARDIAC) 20 MG/ML IV SOLN
INTRAVENOUS | Status: DC | PRN
  Administered 2017-11-02: 100 mg via INTRAVENOUS

## 2017-11-02 MED ORDER — ONDANSETRON HCL 4 MG/2ML IJ SOLN
INTRAMUSCULAR | Status: DC | PRN
  Administered 2017-11-02: 4 mg via INTRAVENOUS

## 2017-11-02 MED ORDER — PHENYLEPHRINE HCL 10 MG/ML IJ SOLN
INTRAMUSCULAR | Status: DC | PRN
  Administered 2017-11-02 (×3): 100 ug via INTRAVENOUS

## 2017-11-02 MED ORDER — SODIUM CHLORIDE 0.9% FLUSH
3.0000 mL | Freq: Two times a day (BID) | INTRAVENOUS | Status: DC
  Administered 2017-11-03: 3 mL via INTRAVENOUS

## 2017-11-02 MED ORDER — ESCITALOPRAM OXALATE 10 MG PO TABS
10.0000 mg | ORAL_TABLET | Freq: Every day | ORAL | Status: DC
  Administered 2017-11-03: 10 mg via ORAL
  Filled 2017-11-02: qty 1

## 2017-11-02 MED ORDER — SODIUM CHLORIDE 0.9 % IV SOLN
INTRAVENOUS | Status: DC
  Administered 2017-11-02: 16:00:00 via INTRAVENOUS

## 2017-11-02 MED ORDER — INSULIN ASPART 100 UNIT/ML ~~LOC~~ SOLN
0.0000 [IU] | Freq: Three times a day (TID) | SUBCUTANEOUS | Status: DC
  Administered 2017-11-02: 8 [IU] via SUBCUTANEOUS
  Administered 2017-11-03: 5 [IU] via SUBCUTANEOUS
  Administered 2017-11-03: 3 [IU] via SUBCUTANEOUS
  Filled 2017-11-02 (×3): qty 1

## 2017-11-02 MED ORDER — PROPOFOL 10 MG/ML IV BOLUS
INTRAVENOUS | Status: AC
  Filled 2017-11-02: qty 20

## 2017-11-02 MED ORDER — MIDAZOLAM HCL 2 MG/2ML IJ SOLN
INTRAMUSCULAR | Status: DC | PRN
  Administered 2017-11-02: 2 mg via INTRAVENOUS

## 2017-11-02 MED ORDER — ALPRAZOLAM 0.5 MG PO TABS
0.5000 mg | ORAL_TABLET | Freq: Four times a day (QID) | ORAL | Status: DC
  Administered 2017-11-03 (×2): 0.5 mg via ORAL
  Filled 2017-11-02 (×2): qty 1

## 2017-11-02 MED ORDER — ONDANSETRON HCL 4 MG/2ML IJ SOLN: 4.0000 mg | Freq: Four times a day (QID) | INTRAMUSCULAR | Status: DC | PRN

## 2017-11-02 MED ORDER — CLONIDINE HCL 0.3 MG/24HR TD PTWK: 0.3000 mg | MEDICATED_PATCH | TRANSDERMAL | Status: DC

## 2017-11-02 MED ORDER — IRBESARTAN 75 MG PO TABS
37.5000 mg | ORAL_TABLET | Freq: Every day | ORAL | Status: DC
  Administered 2017-11-03: 37.5 mg via ORAL
  Filled 2017-11-02: qty 0.5

## 2017-11-02 MED ORDER — SUCCINYLCHOLINE CHLORIDE 20 MG/ML IJ SOLN
INTRAMUSCULAR | Status: AC
  Filled 2017-11-02: qty 1

## 2017-11-02 MED ORDER — POTASSIUM CHLORIDE CRYS ER 10 MEQ PO TBCR
10.0000 meq | EXTENDED_RELEASE_TABLET | Freq: Every day | ORAL | Status: DC
  Administered 2017-11-03: 10 meq via ORAL
  Filled 2017-11-02: qty 1

## 2017-11-02 MED ORDER — INSULIN ASPART 100 UNIT/ML ~~LOC~~ SOLN
SUBCUTANEOUS | Status: AC
  Administered 2017-11-02: 6 [IU] via SUBCUTANEOUS
  Filled 2017-11-02: qty 1

## 2017-11-02 MED ORDER — FENTANYL CITRATE (PF) 100 MCG/2ML IJ SOLN
INTRAMUSCULAR | Status: DC | PRN
  Administered 2017-11-02 (×5): 50 ug via INTRAVENOUS

## 2017-11-02 MED ORDER — OXYCODONE HCL 5 MG PO TABS
5.0000 mg | ORAL_TABLET | ORAL | Status: DC | PRN
  Administered 2017-11-03: 5 mg via ORAL
  Filled 2017-11-02: qty 1

## 2017-11-02 MED ORDER — FENTANYL CITRATE (PF) 100 MCG/2ML IJ SOLN
50.0000 ug | Freq: Once | INTRAMUSCULAR | Status: AC
  Administered 2017-11-02: 50 ug via INTRAVENOUS

## 2017-11-02 MED ORDER — ONDANSETRON HCL 4 MG/2ML IJ SOLN
INTRAMUSCULAR | Status: AC
Start: 2017-11-02 — End: ?
  Filled 2017-11-02: qty 2

## 2017-11-02 MED ORDER — BACITRACIN 50000 UNITS IM SOLR
INTRAMUSCULAR | Status: AC
  Filled 2017-11-02: qty 1

## 2017-11-02 SURGICAL SUPPLY — 55 items
6" COUDE NEEDLE ×2 IMPLANT
BUR NEURO DRILL SOFT 3.0X3.8M (BURR) ×2 IMPLANT
CANISTER SUCT 1200ML W/VALVE (MISCELLANEOUS) ×2 IMPLANT
CHARGER KIT (Stimulator) ×2 IMPLANT
CHLORAPREP W/TINT 26ML (MISCELLANEOUS) ×2 IMPLANT
COUNTER NEEDLE 20/40 LG (NEEDLE) ×2 IMPLANT
COVER LIGHT HANDLE STERIS (MISCELLANEOUS) ×4 IMPLANT
DERMABOND ADVANCED (GAUZE/BANDAGES/DRESSINGS) ×1
DERMABOND ADVANCED .7 DNX12 (GAUZE/BANDAGES/DRESSINGS) ×1 IMPLANT
DRAPE C-ARM 42X72 X-RAY (DRAPES) ×4 IMPLANT
DRAPE C-ARMOR (DRAPES) ×2 IMPLANT
DRAPE INCISE IOBAN 66X45 STRL (DRAPES) ×2 IMPLANT
DRAPE LAPAROTOMY 77X122 PED (DRAPES) ×2 IMPLANT
DRAPE MICROSCOPE SPINE 48X150 (DRAPES) ×2 IMPLANT
DRAPE SURG 17X11 SM STRL (DRAPES) ×8 IMPLANT
DRSG TEGADERM 4X4.75 (GAUZE/BANDAGES/DRESSINGS) ×2 IMPLANT
DRSG TELFA 4X3 1S NADH ST (GAUZE/BANDAGES/DRESSINGS) ×2 IMPLANT
ELECT CAUTERY BLADE TIP 2.5 (TIP) ×2
ELECT COATED BLADE 2.86 ST (ELECTRODE) ×2 IMPLANT
ELECT REM PT RETURN 9FT ADLT (ELECTROSURGICAL) ×2
ELECTRODE CAUTERY BLDE TIP 2.5 (TIP) ×1 IMPLANT
ELECTRODE REM PT RTRN 9FT ADLT (ELECTROSURGICAL) ×1 IMPLANT
FEE INTRAOP MONITOR IMPULS NCS (MISCELLANEOUS) ×1 IMPLANT
GLOVE BIOGEL PI IND STRL 7.0 (GLOVE) ×1 IMPLANT
GLOVE BIOGEL PI INDICATOR 7.0 (GLOVE) ×1
GLOVE SURG SYN 8.5  E (GLOVE) ×2
GLOVE SURG SYN 8.5 E (GLOVE) ×2 IMPLANT
GOWN SRG XL LVL 3 NONREINFORCE (GOWNS) ×1 IMPLANT
GOWN STRL NON-REIN TWL XL LVL3 (GOWNS) ×1
GOWN STRL REUS W/ TWL LRG LVL3 (GOWN DISPOSABLE) ×1 IMPLANT
GOWN STRL REUS W/TWL LRG LVL3 (GOWN DISPOSABLE) ×1
INTRAOP MONITOR FEE IMPULS NCS (MISCELLANEOUS) ×1
INTRAOP MONITOR FEE IMPULSE (MISCELLANEOUS) ×1
KIT LEAD 50CML STIMULATION (Lead) ×1 IMPLANT
KIT LEAD NEUROSTIM 50 (Lead) ×1 IMPLANT
KIT NDL INSERTION CVD NEU 6 (NEEDLE) ×1 IMPLANT
KIT NEUROSTIMULATOR SENZA (Stimulator) ×2 IMPLANT
KIT RM TURNOVER STRD PROC AR (KITS) ×2 IMPLANT
NEEDLE HYPO 22GX1.5 SAFETY (NEEDLE) ×2 IMPLANT
NEEDLE INSERTION CVD 6.0IN (NEEDLE) ×1
NEVRO CHARGER KIT ×2 IMPLANT
NEVRO PATIENT REMOTE KIT ×2 IMPLANT
NS IRRIG 1000ML POUR BTL (IV SOLUTION) ×2 IMPLANT
PACK LAMINECTOMY NEURO (CUSTOM PROCEDURE TRAY) ×2 IMPLANT
PATEINT REMOTE KIT (Stimulator) ×1 IMPLANT
PATIENT REMOTE KIT (Stimulator) ×1 IMPLANT
PENCIL ELECTRO HAND CTR (MISCELLANEOUS) ×2 IMPLANT
STAPLER SKIN PROX 35W (STAPLE) ×4 IMPLANT
SUT BONE WAX W31G (SUTURE) ×2 IMPLANT
SUT VIC AB 2-0 CT1 18 (SUTURE) ×2 IMPLANT
SYR 10ML LL (SYRINGE) ×2 IMPLANT
SYR BULB IRRIG 60ML STRL (SYRINGE) ×2 IMPLANT
SYRINGE 10CC LL (SYRINGE) ×2 IMPLANT
TOWEL OR 17X26 4PK STRL BLUE (TOWEL DISPOSABLE) ×6 IMPLANT
TUNNELING TOOL KIT, 50CM ×2 IMPLANT

## 2017-11-02 NOTE — Progress Notes (Signed)
  History: Brittney [name] is POD0 s/p spinal cord stimulator placement for right leg pain. She is doing well. Patient had just entered room when examined and was still drowsy from anesthesia. Complains of right leg pain, unchanged from baseline. Also complains of back pain and left arm numbness distal to elbow.   Physical Exam: Vitals:   11/02/17 1619 11/02/17 1645  BP: (!) 114/54 (!) 144/58  Pulse: 79 80  Resp:  18  Temp:  97.9 F (36.6 C)  SpO2: 98% 95%    AA Ox3 CNI Skin: Incision dressing wet with blood. On changing dressing, incision was intact but blood was draining from last v-lock stitch beside the actual incision site. Left buttock incision dressing clean and dry.  Strength:5/5 throughout, sensation intact and symmetric upper and lower extremities, except increased sensation on left arm   Data:  Recent Labs  Lab 10/28/17 0905  NA 137  K 3.8  CL 101  CO2 28  BUN 14  CREATININE 0.60  GLUCOSE 204*  CALCIUM 9.5   Recent Labs  Lab 10/28/17 0905  AST 29  ALT 24  ALKPHOS 103     No results for input(s): WBC, HGB, HCT, PLT in the last 168 hours. No results for input(s): APTT, INR in the last 168 hours.       Assessment/Plan:  Brittney [name] is POD0 s/p spinal cord stimulator placement. Overall she is doing well. Will monitor bleeding and reinforce dressing if necessary.   Mobilize pain control DVT prophylaxis  Marin Olp PA-C Department of Neurosurgery

## 2017-11-02 NOTE — OR Nursing (Signed)
Patient does not have anyone to stay with her overnight. Daughter unable to accompany her at home. Dr. Izora Ribas aware and will consider observation status for her.

## 2017-11-02 NOTE — Progress Notes (Signed)
Pharmacy Antibiotic Note  Brittney [name] is a 66 y.o. female admitted on 11/02/2017 with surgical prophylaxis.  Pharmacy has been consulted for vanc/gent dosing.  Plan: Will dose the patient w/ vanc 15 mg/kg IV x 1 -- vanc 1.25g IV x 1 pre-op  Will dose the patient w/ gent 5 mg/kg IV x 1 -- gent 430 mg IV x 1 pre-op based on adjusted body weight.  Height: 5\' 6"  (167.6 cm) Weight: 276 lb (125.2 kg) IBW/kg (Calculated) : 59.3  Temp (24hrs), Avg:98.8 F (37.1 C), Min:98.8 F (37.1 C), Max:98.8 F (37.1 C)  Recent Labs  Lab 10/28/17 0905  CREATININE 0.60    Estimated Creatinine Clearance: 96.1 mL/min (by C-G formula based on SCr of 0.6 mg/dL).    Allergies  Allergen Reactions  . Eggs Or Egg-Derived Products Nausea And Vomiting  . Shellfish Allergy Nausea And Vomiting    Non stop sickness once ingests shellfish. Betadine is okay  . Amlodipine Cough  . Imdur [Isosorbide Dinitrate] Other (See Comments)    Headache  . Lyrica [Pregabalin] Swelling and Other (See Comments)    "FLU-LIKE" SYMPTOMS  . Monosodium Glutamate Other (See Comments)    Headache  . Tape Other (See Comments)    can use paper tape    Thank you for allowing pharmacy to be a part of this patient's care.  Tobie Lords, PharmD, BCPS Clinical Pharmacist 11/02/2017

## 2017-11-02 NOTE — OR Nursing (Signed)
Small sore on upper right area of back. Not draining. Lower back has old rash from tape reaction. Left forearm has scab region that is open but healing.

## 2017-11-02 NOTE — Anesthesia Procedure Notes (Deleted)
Procedure Name: Intubation Date/Time: 11/02/2017 7:40 AM Performed by: Demetrius Charity, CRNA Pre-anesthesia Checklist: Patient identified, Patient being monitored, Timeout performed, Emergency Drugs available and Suction available Patient Re-evaluated:Patient Re-evaluated prior to induction Oxygen Delivery Method: Circle system utilized Preoxygenation: Pre-oxygenation with 100% oxygen Induction Type: IV induction Laryngoscope Size: 3 and McGraph Grade View: Grade IV Tube type: Oral Tube size: 7.0 mm Number of attempts: 1 Airway Equipment and Method: Stylet Placement Confirmation: ETT inserted through vocal cords under direct vision,  positive ETCO2 and breath sounds checked- equal and bilateral Secured at: 22 cm Tube secured with: Tape Dental Injury: Teeth and Oropharynx as per pre-operative assessment  Comments: Per report from [facility]

## 2017-11-02 NOTE — H&P (Signed)
I have reviewed and confirmed my history and physical from 10/20/17 with no additions or changes. Plan for placement of spinal cord stimulator.  Risks and benefits reviewed.  Heart sounds normal no MRG. Chest Clear to Auscultation Bilaterally.

## 2017-11-02 NOTE — Transfer of Care (Signed)
Immediate Anesthesia Transfer of Care Note  Patient: Brittney [name]  Procedure(s) Performed: UNILATERAL PULSE GENERATOR IMPLANT (N/A Back)  Patient Location: PACU  Anesthesia Type:General  Level of Consciousness: sedated  Airway & Oxygen Therapy: Patient Spontanous Breathing and Patient connected to face mask oxygen  Post-op Assessment: Report given to RN and Post -op Vital signs reviewed and stable  Post vital signs: Reviewed and stable  Last Vitals:  Vitals:   11/02/17 0631 11/02/17 1244  BP: (!) 172/71 (!) 125/58  Pulse: 72 77  Resp: 18 17  Temp: 37.1 C (!) 36.3 C  SpO2: 93% 99%    Last Pain:  Vitals:   11/02/17 0631  TempSrc: Tympanic  PainSc: 3       Patients Stated Pain Goal: 1 (24/82/50 0370)  Complications: No apparent anesthesia complications

## 2017-11-02 NOTE — Anesthesia Post-op Follow-up Note (Signed)
Anesthesia QCDR form completed.        

## 2017-11-02 NOTE — Op Note (Signed)
Indications:  Lumbar radiculopathy that did not improve with conservative management.  History of SCS trail with positive improvement in pain scores  Findings: inability to tunnel percutaneous lead, correct placement of paddle  Preoperative Diagnosis: Lumbar radiculopath Postoperative Diagnosis: same   EBL: 200 ml IVF: 1600 ml Drains: none Disposition: Extubated and Stable to PACU Complications: none  No foley catheter was placed.   Preoperative Note:   Risks of surgery discussed include: infection, bleeding, stroke, coma, death, paralysis, CSF leak, nerve/spinal cord injury, numbness, tingling, weakness, complex regional pain syndrome, recurrent stenosis and/or disc herniation, vascular injury, development of instability, neck/back pain, need for further surgery, persistent symptoms, development of deformity, and the risks of anesthesia. They understood these risks and have agreed to proceed.  Operative Note:  1. T11 partial and T12 complete laminectomy for placement of spinal cord stimulator (Nevro Senza with paddle)   The patient was brought to the Operating Room, intubated and turned into the prone position. All pressure points were checked and double checked. Flouroscopy was used to mark the incision. The patient was prepped and draped in the standard fashion. A full timeout was performed. Preoperative antibiotics were given.   The midline T12-L1 incision was opened with a scalpel, then the soft tissues divided with the Bovie. Self-retaining retractors were placed.  Using flouroscopy, the lead placement cannula was placed into the epidural space using a loss of resistance technique.  This was attempted multiple times without ability to thread the lead.  Thus, a decision was made to make a laminectomy to allow for placement of the lead.   The paraspinus muscles were reflected laterally in subperiosteal fashion until the laminae were visible. Flouroscopy was used to confirm our  localization. After replacement of self-retaining retractors, the T12 and inferior half of T11 laminae were visible.  Using the Leksell rongeur, the spinous process at T12 was removed, as was part of the T11 spinous process.  The ligamentum flavum was identified, then removed in piecemeal fashion.  The dura was identified.  The microscope was used to complete the opening to allow for lead placement.    Using the cannula, I attempted to get the lead to threw.  The lead would not thread into the appropriate position, so a decision was made to convert to a paddle.  We completed the T12 laminectomy and removed the inferior half of the T11 lamina using the drill.  Once the laminectomy was widened to allow placement of the paddle, it was introduced and threaded superiorly to the top of the T9 vertebral body in the midline.  This position was confirmed on flouroscopy.  After placement of the paddle, the leaders were secured to the L1 spinous process.    At this point, the pocket for the battery on the left was opened with a scalpel, and a bovie used to create the pocket.  The tunneler was used to tunnel the leads to the pocket, where the battery was secured and confirmed to be working by the reps in the room.  At this point, the incisions were copiously irrigated.  The battery was placed into the pocket and vancomycin powder was placed over it.    We then turned attention to closure.    Each wound was closed in layers with 0 and 2-0 vicryl. 3-0 monocryl and dermabond  was applied to the incision.  The patient was then flipped supine and extubated with incident. All counts were correct times 2 at the end of the case. No  immediate complications were noted.  Monitoring was used throughout without any changes.  I performed the entire procedure with assistance of Marin Olp PA as an Environmental consultant.  Meade Maw MD

## 2017-11-02 NOTE — Anesthesia Preprocedure Evaluation (Addendum)
Anesthesia Evaluation  Patient identified by MRN, date of birth, ID band Patient awake    Reviewed: Allergy & Precautions, NPO status , Patient's Chart, lab work & pertinent test results, reviewed documented beta blocker date and time   Airway Mallampati: III  TM Distance: >3 FB     Dental  (+) Chipped, Lower Dentures, Missing, Poor Dentition   Pulmonary asthma , sleep apnea ,           Cardiovascular hypertension, Pt. on medications and Pt. on home beta blockers + CAD, + Past MI and + Peripheral Vascular Disease       Neuro/Psych  Neuromuscular disease    GI/Hepatic   Endo/Other  diabetes, Type 2Morbid obesity  Renal/GU Renal disease     Musculoskeletal  (+) Arthritis , Fibromyalgia -  Abdominal   Peds  Hematology  (+) anemia ,   Anesthesia Other Findings No chest symptoms. Poor R waves, otherwise OK.  Reproductive/Obstetrics                           Anesthesia Physical Anesthesia Plan  ASA: III  Anesthesia Plan:    Post-op Pain Management:    Induction: Intravenous  PONV Risk Score and Plan: 2  Airway Management Planned:   Additional Equipment:   Intra-op Plan:   Post-operative Plan:   Informed Consent: I have reviewed the patients History and Physical, chart, labs and discussed the procedure including the risks, benefits and alternatives for the proposed anesthesia with the patient or authorized representative who has indicated his/her understanding and acceptance.     Plan Discussed with: CRNA  Anesthesia Plan Comments:         Anesthesia Quick Evaluation

## 2017-11-02 NOTE — Anesthesia Postprocedure Evaluation (Signed)
Anesthesia Post Note  Patient: Brittney [name]  Procedure(s) Performed: UNILATERAL PULSE GENERATOR IMPLANT (N/A Back)  Patient location during evaluation: PACU Anesthesia Type: General Level of consciousness: awake and alert Pain management: pain level controlled Vital Signs Assessment: post-procedure vital signs reviewed and stable Respiratory status: spontaneous breathing, nonlabored ventilation, respiratory function stable and patient connected to nasal cannula oxygen Cardiovascular status: blood pressure returned to baseline and stable Postop Assessment: no apparent nausea or vomiting Anesthetic complications: no     Last Vitals:  Vitals:   11/02/17 1429 11/02/17 1459  BP: 118/61 (!) 132/53  Pulse: 80 77  Resp: 14 17  Temp:    SpO2: 95% 99%    Last Pain:  Vitals:   11/02/17 1459  TempSrc:   PainSc: [place] S

## 2017-11-03 DIAGNOSIS — M5416 Radiculopathy, lumbar region: Secondary | ICD-10-CM | POA: Diagnosis not present

## 2017-11-03 LAB — GLUCOSE, CAPILLARY
GLUCOSE-CAPILLARY: 237 mg/dL — AB (ref 65–99)
Glucose-Capillary: 194 mg/dL — ABNORMAL HIGH (ref 65–99)

## 2017-11-03 MED ORDER — INSULIN REGULAR HUMAN (CONC) 500 UNIT/ML ~~LOC~~ SOPN
70.0000 [IU] | PEN_INJECTOR | Freq: Three times a day (TID) | SUBCUTANEOUS | Status: DC
  Filled 2017-11-03: qty 3

## 2017-11-03 MED ORDER — METHOCARBAMOL 750 MG PO TABS: 750.0000 mg | ORAL_TABLET | Freq: Four times a day (QID) | ORAL | 0 refills | Status: DC | PRN

## 2017-11-03 MED ORDER — METHOCARBAMOL 500 MG PO TABS: 750.0000 mg | ORAL_TABLET | Freq: Four times a day (QID) | ORAL | Status: DC | PRN

## 2017-11-03 MED ORDER — CELECOXIB 200 MG PO CAPS: 200.0000 mg | ORAL_CAPSULE | Freq: Two times a day (BID) | ORAL | Status: DC

## 2017-11-03 MED ORDER — KETOROLAC TROMETHAMINE 15 MG/ML IJ SOLN
15.0000 mg | Freq: Four times a day (QID) | INTRAMUSCULAR | Status: DC | PRN
  Administered 2017-11-03: 15 mg via INTRAVENOUS
  Filled 2017-11-03: qty 1

## 2017-11-03 MED ORDER — METHOCARBAMOL 1000 MG/10ML IJ SOLN
500.0000 mg | Freq: Four times a day (QID) | INTRAVENOUS | Status: DC | PRN
  Filled 2017-11-03: qty 5

## 2017-11-03 MED ORDER — OXYCODONE HCL 5 MG PO TABS: 5.0000 mg | ORAL_TABLET | ORAL | 0 refills | Status: DC | PRN

## 2017-11-03 MED ORDER — CELECOXIB 200 MG PO CAPS: 200.0000 mg | ORAL_CAPSULE | Freq: Two times a day (BID) | ORAL | 0 refills | Status: DC

## 2017-11-03 MED ORDER — GABAPENTIN 600 MG PO TABS
600.0000 mg | ORAL_TABLET | Freq: Every day | ORAL | Status: DC
  Administered 2017-11-03: 600 mg via ORAL
  Filled 2017-11-03 (×5): qty 1

## 2017-11-03 NOTE — Progress Notes (Signed)
Discharge summary reviewed with verbal understanding. 1 narcotic rx given upon discharge. Escorted to personal vehicle via wc by ortho staff. Instructions provided regarding dressing.

## 2017-11-03 NOTE — Progress Notes (Addendum)
Inpatient Diabetes Program Recommendations  AACE/ADA: New Consensus Statement on Inpatient Glycemic Control (2015)  Target Ranges:  Prepandial:   less than 140 mg/dL      Peak postprandial:   less than 180 mg/dL (1-2 hours)      Critically ill patients:  140 - 180 mg/dL   Lab Results  Component Value Date   GLUCAP 194 (H) 11/03/2017   HGBA1C 9.3 (H) 10/23/2014    Inpatient Diabetes Program Recommendations:  Received page regarding patient's concerns regarding insulin.  Spoke at length to patient regarding her fears of having her blood sugars go too low.  She reports that really, she eats only 2 meals per day; brunch and supper.  She generally eats Rice Crispies, milk and a banana and takes 70 units with brunch and 70 units U500 with supper.  She only wants U500 35 units tid while here with the right to refuse- consider changing the order to reflect this.   She is ordered Levemir 90 units bid and she wants to take it.  I educated her on the importance of taking insulin and that pain, stress and steroids may contribute to elevated CBG.    Educated on the carb modified diet menu at [place], RN, [place], [place], CDE Diabetes Coordinator Inpatient Diabetes Program  [phone] (Team Pager) [phone] ([place]) 11/03/2017 9:57 AM

## 2017-11-03 NOTE — Progress Notes (Signed)
Inpatient Diabetes Program Recommendations  AACE/ADA: New Consensus Statement on Inpatient Glycemic Control (2015)  Target Ranges:  Prepandial:   less than 140 mg/dL      Peak postprandial:   less than 180 mg/dL (1-2 hours)      Critically ill patients:  140 - 180 mg/dL   Lab Results  Component Value Date   GLUCAP 194 (H) 11/03/2017   HGBA1C 9.3 (H) 10/23/2014    Inpatient Diabetes Program Recommendations:   Patient of Dr. Gabriel Carina, last seen on 08/08/17 at which MD stated that the diabetes is controlled.  A1C 7.6%.     Agree with current medications for blood sugar management.   Gentry Fitz, RN, BA, MHA, CDE Diabetes Coordinator Inpatient Diabetes Program  [phone] (Team Pager) [phone] ([place]) 11/03/2017 9:15 AM

## 2017-11-03 NOTE — Discharge Instructions (Addendum)
°Your surgeon has performed an operation on your lumbar spine (low back) to relieve pressure on one or more nerves. Many times, patients feel better immediately after surgery and can “overdo it.” Even if you feel well, it is important that you follow these activity guidelines. If you do not let your back heal properly from the surgery, you can increase the chance of a disc herniation and/or return of your symptoms. The following are instructions to help in your recovery once you have been discharged from the hospital. ° °* Do not take anti-inflammatory medications for 3 days after surgery (naproxen [Aleve], ibuprofen [Advil, Motrin], etc.) ° °Activity  °  °No bending, lifting, or twisting (“BLT”). Avoid lifting objects heavier than 10 pounds (gallon milk jug).  Where possible, avoid household activities that involve lifting, bending, pushing, or pulling such as laundry, vacuuming, grocery shopping, and childcare. Try to arrange for help from friends and family for these activities while your back heals. ° °Increase physical activity slowly as tolerated.  Taking short walks is encouraged, but avoid strenuous exercise. Do not jog, run, bicycle, lift weights, or participate in any other exercises unless specifically allowed by your doctor. Avoid prolonged sitting, including car rides. ° °Talk to your doctor before resuming sexual activity. ° °You should not drive until cleared by your doctor. ° °Until released by your doctor, you should not return to work or school.  You should rest at home and let your body heal.  ° °You may shower two days after your surgery.  After showering, lightly dab your incision dry. Do not take a tub bath or go swimming for 3 weeks, or until approved by your doctor at your follow-up appointment. ° °If you smoke, we strongly recommend that you quit.  Smoking has been proven to interfere with normal healing in your back and will dramatically reduce the success rate of your surgery. Please  contact [facility] (800-QUIT-NOW) and use the resources at www.[facility].com for assistance in stopping smoking. ° °Surgical Incision °  °If you have a dressing on your incision, you may remove it three days after your surgery. Keep your incision area clean and dry. ° °If you have staples or stitches on your incision, you should have a follow up scheduled for removal. If you do not have staples or stitches, you will have steri-strips (small pieces of surgical tape) or Dermabond glue. The steri-strips/glue should begin to peel away within about a week (it is fine if the steri-strips fall off before then). If the strips are still in place one week after your surgery, you may gently remove them. ° °Diet          ° ° You may return to your usual diet. Be sure to stay hydrated. ° °When to Contact Us ° °Although your surgery and recovery will likely be uneventful, you may have some residual numbness, aches, and pains in your back and/or legs. This is normal and should improve in the next few weeks. ° °However, should you experience any of the following, contact us immediately: °• New numbness or weakness °• Pain that is progressively getting worse, and is not relieved by your pain medications or rest °• Bleeding, redness, swelling, pain, or drainage from surgical incision °• Chills or flu-like symptoms °• Fever greater than 101.0 F (38.3 C) °• Problems with bowel or bladder functions °• Difficulty breathing or shortness of breath °• Warmth, tenderness, or swelling in your calf ° °Contact Information °• During office hours (Monday-Friday 9 am   to 5 pm), please call your physician at [phone] °• After hours and weekends, please call the [facility] Operator at [phone] and ask for the Neurosurgery Resident On Call  °• For a life-threatening emergency, call 911 ° ° °

## 2017-11-03 NOTE — Care Management Obs Status (Signed)
[facility] NOTIFICATION   Patient Details  Name: Brittney [name] MRN: [id number] Date of Birth: June 09, 1951   Medicare Observation Status Notification Given:  Yes    Marshell Garfinkel, RN 11/03/2017, 10:12 AM

## 2017-11-03 NOTE — Progress Notes (Signed)
Paged Diabetes coordinator with concerns about patient education with insulins. Coordinator will round today.

## 2017-11-03 NOTE — Care Management Note (Signed)
Case Management Note  Patient Details  Name: Brittney [name] MRN: [id number] Date of Birth: [birth year]  Subjective/Objective:                   Spoke with patient regarding discharge planning. She plans to return home at discharge where she will be alone. She does not think that she is ready for discharge today. She has prearranged home health through [facility] which has been confirmed with Dr. Izora Ribas and they will obtain orders from his office. No DME needs.  Action/Plan: [facility] home [facility] rep Joelene Millin notified.   Expected Discharge Date:                  Expected Discharge Plan:     In-House Referral:     Discharge planning Services  CM Consult  Post Acute Care Choice:  Home Health Choice offered to:  Patient  DME Arranged:    DME Agency:     HH Arranged:  PT HH Agency:  [facility]  Status of Service:  In process, will continue to follow  If discussed at Long Length of Stay Meetings, dates discussed:    Additional Comments:  [name], RN 11/03/2017, 10:07 AM

## 2017-11-03 NOTE — Progress Notes (Signed)
  History: Brittney [name] is POD1 s/p spinal cord stimulator placement for right leg pain. She is doing well. Complains of 9/10 back pain when moving. Does not feel like pain is adequately controlled with current medications. Pharmacy did contact me with concerns of high dosing on at-home medications and some dosing was changed, including gabapentin. SCS will not be turned on for 5-7 days so symptoms prior to surgery persist. Denies any new symptoms since surgery was performed.   Physical Exam: Vitals:   11/03/17 0803 11/03/17 1144  BP: (!) 173/64 (!) 184/69  Pulse: 95 81  Resp:  16  Temp: 99.8 F (37.7 C) 98.9 F (37.2 C)  SpO2: 95% 92%    AA Ox3 CNI Skin: Incision dressing was significantly drier today.  Strength:5/5 throughout, sensation intact and symmetric.    Data:  Recent Labs  Lab 10/28/17 0905  NA 137  K 3.8  CL 101  CO2 28  BUN 14  CREATININE 0.60  GLUCOSE 204*  CALCIUM 9.5   Recent Labs  Lab 10/28/17 0905  AST 29  ALT 24  ALKPHOS 103     No results for input(s): WBC, HGB, HCT, PLT in the last 168 hours. No results for input(s): APTT, INR in the last 168 hours.       Assessment/Plan:  Brittney [name] is POD1 s/p spinal cord stimulator placement. She is doing well except for complaint of uncontrolled back pain. Restored gabapentin to at-home dosing of 600 mg 6 times a day. Also added toradol 15 mg for 3 doses and increased robaxin to 750 mg  to help with pain levels. Will recheck pain control later in the day to see if improvement has been made.    Mobilize pain control DVT prophylaxis  Marin Olp PA-C Department of Neurosurgery

## 2017-11-03 NOTE — Discharge Summary (Signed)
  History: Brittney [name] is POD1 sp SCS placement. Surgery had to modified due to difficulty in placement. Extent of modification warranted inpatient observation. Patient experienced significant pain and pain medications were adjusted. Currently pain 5 or 6/10 when laying down. SCS will turned on in approximately 6 days.   Physical Exam: Vitals:   11/03/17 1144 11/03/17 1408  BP: (!) 184/69 (!) 181/73  Pulse: 81 80  Resp: 16 16  Temp: 98.9 F (37.2 C) 99.8 F (37.7 C)  SpO2: 92% 94%    AA Ox3 CNI Skin: Incision dressed. Minimal blood drainage. Unchanged from this morning.  Strength:5/5 throughout, sensation intact.   Data:  Recent Labs  Lab 10/28/17 0905  NA 137  K 3.8  CL 101  CO2 28  BUN 14  CREATININE 0.60  GLUCOSE 204*  CALCIUM 9.5   Recent Labs  Lab 10/28/17 0905  AST 29  ALT 24  ALKPHOS 103     No results for input(s): WBC, HGB, HCT, PLT in the last 168 hours. No results for input(s): APTT, INR in the last 168 hours.       Assessment/Plan:  Brittney [name] is doing well s/p SCS placement. No new complaints after surgical procedure except back pain. Pain became tolerable after medication adjustments to include robaxin increase, oxycodone, and toradol. Patient advised not to bend, twist, or lift greater than 10 pounds. All questions answered and patient expressed understanding.  Advised to contact office if additional questions or concerns arise.   Marin Olp PA-C Department of Neurosurgery

## 2017-11-08 ENCOUNTER — Encounter: Payer: Self-pay | Admitting: Neurosurgery

## 2017-11-08 ENCOUNTER — Ambulatory Visit: Payer: Medicare Other

## 2017-11-11 ENCOUNTER — Ambulatory Visit: Payer: Medicare Other | Admitting: Pulmonary Disease

## 2017-11-14 ENCOUNTER — Ambulatory Visit: Payer: Medicare Other | Admitting: Pulmonary Disease

## 2017-11-29 ENCOUNTER — Ambulatory Visit: Payer: Medicare Other

## 2017-12-05 ENCOUNTER — Ambulatory Visit: Payer: Medicare Other | Admitting: Pulmonary Disease

## 2017-12-08 ENCOUNTER — Ambulatory Visit: Payer: Medicare Other | Attending: Pulmonary Disease

## 2017-12-08 DIAGNOSIS — R05 Cough: Secondary | ICD-10-CM | POA: Insufficient documentation

## 2017-12-08 DIAGNOSIS — R0600 Dyspnea, unspecified: Secondary | ICD-10-CM

## 2017-12-08 DIAGNOSIS — R0609 Other forms of dyspnea: Secondary | ICD-10-CM

## 2017-12-08 DIAGNOSIS — R053 Chronic cough: Secondary | ICD-10-CM

## 2017-12-08 MED ORDER — ALBUTEROL SULFATE (2.5 MG/3ML) 0.083% IN NEBU
2.5000 mg | INHALATION_SOLUTION | Freq: Once | RESPIRATORY_TRACT | Status: AC
  Administered 2017-12-08: 2.5 mg via RESPIRATORY_TRACT
  Filled 2017-12-08: qty 3

## 2017-12-21 ENCOUNTER — Ambulatory Visit: Payer: Medicare Other | Attending: Internal Medicine

## 2017-12-21 DIAGNOSIS — G4700 Insomnia, unspecified: Secondary | ICD-10-CM | POA: Diagnosis present

## 2017-12-21 DIAGNOSIS — G4733 Obstructive sleep apnea (adult) (pediatric): Secondary | ICD-10-CM | POA: Diagnosis not present

## 2017-12-30 ENCOUNTER — Telehealth: Payer: Self-pay | Admitting: *Deleted

## 2017-12-30 DIAGNOSIS — G4733 Obstructive sleep apnea (adult) (pediatric): Secondary | ICD-10-CM

## 2017-12-30 NOTE — Telephone Encounter (Signed)
Pt aware of result and titration study ordered.

## 2018-01-02 ENCOUNTER — Encounter: Payer: Self-pay | Admitting: Pulmonary Disease

## 2018-01-02 ENCOUNTER — Ambulatory Visit (INDEPENDENT_AMBULATORY_CARE_PROVIDER_SITE_OTHER): Payer: Medicare Other | Admitting: Pulmonary Disease

## 2018-01-02 VITALS — BP 132/70 | HR 80 | Ht 66.0 in | Wt 290.0 lb

## 2018-01-02 DIAGNOSIS — R05 Cough: Secondary | ICD-10-CM | POA: Diagnosis not present

## 2018-01-02 DIAGNOSIS — J209 Acute bronchitis, unspecified: Secondary | ICD-10-CM | POA: Diagnosis not present

## 2018-01-02 DIAGNOSIS — R059 Cough, unspecified: Secondary | ICD-10-CM

## 2018-01-02 DIAGNOSIS — G4733 Obstructive sleep apnea (adult) (pediatric): Secondary | ICD-10-CM

## 2018-01-02 MED ORDER — DOXYCYCLINE HYCLATE 100 MG PO TABS: 100.0000 mg | ORAL_TABLET | Freq: Two times a day (BID) | ORAL | 0 refills | Status: DC

## 2018-01-02 NOTE — Progress Notes (Signed)
PULMONARY OFFICE FOLLOW-UP NOTE  Requesting MD/Service: Emily Filbert, MD Date of initial consultation: 09/30/17 Reason for consultation: Chronic cough  PT PROFILE: 67 y.o. female never smoker previously see by Dr Ileene Musa and Dr Raul Del referred for eval of chronic cough of 4 yrs duration  DATA: 05/25/17 CT chest: Normal lung parenchyma. 3 mm posterior segment right upper lobe nodule, stable from 10/15/2014 and therefore benign. 3 mm left lower lobe nodular density, likely mucoid impaction  12/08/17 PFTs: No obstruction, lung volumes low-normal, DLCO 69% predicted, DLCO/VA 136% predicted 12/21/17 PSG: RDI 5.3. CPAP titration study recommended   SUBJ:  This is a routine reevaluation.  She was initially evaluated for chronic cough.  At that time, we changed the ACE inhibitor to an ARB.  She was treated with doxycycline for 10 days.  She also described excessive daytime sleepiness a sleep study was ordered.  She returns today with significant improvement in cough.  She states that it was 90% resolved or better.  However, in the past 2 weeks, she has had a relapse with cough productive of scant green mucus.  She denies chest pain, fever, hemoptysis.  She has had some increased shortness of breath.  Sleep study results are noted above.  PFTs are noted above.   Vitals:   01/02/18 1401 01/02/18 1407  BP:  132/70  Pulse:  80  SpO2:  91%  Weight: 131.5 kg (290 lb)   Height: 5\' 6"  (1.676 m)   RA   EXAM:  Gen: Severely obese, NAD, not coughing during this encounter HEENT: WNL Neck: JVD not visualized Lungs: breath sounds are slightly coarse without wheezes or other adventitious sounds Cardiovascular: RRR, no murmurs noted Abdomen: Obese, soft, nontender, normal BS Ext: Symmetric pretibial, ankle and pedal edema Neuro: CNs grossly intact, motor and sensory intact Skin: Limited exam, no lesions noted  DATA:   BMP Latest Ref Rng & Units 10/28/2017 12/07/2016 06/03/2016  Glucose 65 - 99 mg/dL  204(H) 43(LL) -  BUN 6 - 20 mg/dL 14 13 -  Creatinine 0.44 - 1.00 mg/dL 0.60 0.66 0.70  Sodium 135 - 145 mmol/L 137 139 -  Potassium 3.5 - 5.1 mmol/L 3.8 3.7 -  Chloride 101 - 111 mmol/L 101 103 -  CO2 22 - 32 mmol/L 28 28 -  Calcium 8.9 - 10.3 mg/dL 9.5 9.9 -    CBC Latest Ref Rng & Units 10/05/2017 07/13/2017 09/20/2016  WBC 3.6 - 11.0 K/uL 7.1 8.4 9.5  Hemoglobin 12.0 - 16.0 g/dL 12.3 12.0 12.0  Hematocrit 35.0 - 47.0 % 37.7 36.0 37.6  Platelets 150 - 440 K/uL 188 202 249    CXR:  No recent film  IMPRESSION:     ICD-10-CM   1. OSA (obstructive sleep apnea) G47.33   2. Cough R05   3. 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 Severe obesity (BMI >= 40) (HCC) E66.01    Chronic cough has resolved.  However, she now has symptoms of acute bronchitis with purulent sputum production.  She has never smoked.  There is no evidence of COPD on PFTs.  There is not convincing evidence of asthma.  She is morbidly obese and exertional dyspnea is almost certainly attributable to this  Obstructive sleep apnea appears to be mild but daytime hypersomnolence is significant   PLAN:  -Doxycycline 100 mg twice a day for 7 days -CPAP titration study has been ordered -We will contact her after the results of CPAP titration study are available -Dietary strategies for weight  loss were discussed.  I emphasized a diet focusing on real foods with low glycemic index  Follow-up in 2 months   Merton Border, MD PCCM service Mobile [phone] Pager [phone] 01/02/2018 2:47 PM

## 2018-01-02 NOTE — Patient Instructions (Signed)
Doxycycline 100 mg twice a day for 7 days We will contact you after the results of CPAP titration study are available Dietary strategies for weight loss were discussed Follow-up in 2 months

## 2018-01-03 ENCOUNTER — Ambulatory Visit: Payer: Medicare Other | Admitting: Student in an Organized Health Care Education/Training Program

## 2018-01-10 ENCOUNTER — Ambulatory Visit: Payer: Medicare Other | Attending: Internal Medicine

## 2018-01-10 DIAGNOSIS — G4733 Obstructive sleep apnea (adult) (pediatric): Secondary | ICD-10-CM | POA: Diagnosis not present

## 2018-01-10 DIAGNOSIS — G4736 Sleep related hypoventilation in conditions classified elsewhere: Secondary | ICD-10-CM | POA: Diagnosis not present

## 2018-01-13 DIAGNOSIS — G4733 Obstructive sleep apnea (adult) (pediatric): Secondary | ICD-10-CM | POA: Diagnosis not present

## 2018-01-16 ENCOUNTER — Telehealth: Payer: Self-pay | Admitting: *Deleted

## 2018-01-16 DIAGNOSIS — G4733 Obstructive sleep apnea (adult) (pediatric): Secondary | ICD-10-CM

## 2018-01-16 NOTE — Telephone Encounter (Signed)
Pt aware of results of sleep study. Orders placed.

## 2018-01-18 ENCOUNTER — Ambulatory Visit
Admission: RE | Admit: 2018-01-18 | Discharge: 2018-01-18 | Disposition: A | Discharge status: Home / Self Care | Payer: Medicare Other | Source: Ambulatory Visit | Attending: Student in an Organized Health Care Education/Training Program | Admitting: Student in an Organized Health Care Education/Training Program

## 2018-01-18 ENCOUNTER — Encounter: Payer: Self-pay | Admitting: Student in an Organized Health Care Education/Training Program

## 2018-01-18 ENCOUNTER — Ambulatory Visit (HOSPITAL_BASED_OUTPATIENT_CLINIC_OR_DEPARTMENT_OTHER): Payer: Medicare Other | Admitting: Student in an Organized Health Care Education/Training Program

## 2018-01-18 VITALS — BP 153/63 | HR 65 | Temp 98.4°F | Resp 16 | Ht 66.0 in | Wt 287.0 lb

## 2018-01-18 DIAGNOSIS — M79602 Pain in left arm: Secondary | ICD-10-CM | POA: Insufficient documentation

## 2018-01-18 DIAGNOSIS — M47816 Spondylosis without myelopathy or radiculopathy, lumbar region: Secondary | ICD-10-CM | POA: Insufficient documentation

## 2018-01-18 DIAGNOSIS — M961 Postlaminectomy syndrome, not elsewhere classified: Secondary | ICD-10-CM

## 2018-01-18 DIAGNOSIS — Z9889 Other specified postprocedural states: Secondary | ICD-10-CM | POA: Diagnosis not present

## 2018-01-18 DIAGNOSIS — M79605 Pain in left leg: Secondary | ICD-10-CM | POA: Diagnosis not present

## 2018-01-18 DIAGNOSIS — M797 Fibromyalgia: Secondary | ICD-10-CM | POA: Diagnosis not present

## 2018-01-18 DIAGNOSIS — E785 Hyperlipidemia, unspecified: Secondary | ICD-10-CM | POA: Insufficient documentation

## 2018-01-18 DIAGNOSIS — Z825 Family history of asthma and other chronic lower respiratory diseases: Secondary | ICD-10-CM | POA: Insufficient documentation

## 2018-01-18 DIAGNOSIS — E1143 Type 2 diabetes mellitus with diabetic autonomic (poly)neuropathy: Secondary | ICD-10-CM | POA: Insufficient documentation

## 2018-01-18 DIAGNOSIS — M792 Neuralgia and neuritis, unspecified: Secondary | ICD-10-CM

## 2018-01-18 DIAGNOSIS — N182 Chronic kidney disease, stage 2 (mild): Secondary | ICD-10-CM | POA: Diagnosis not present

## 2018-01-18 DIAGNOSIS — G8929 Other chronic pain: Secondary | ICD-10-CM

## 2018-01-18 DIAGNOSIS — G4733 Obstructive sleep apnea (adult) (pediatric): Secondary | ICD-10-CM | POA: Insufficient documentation

## 2018-01-18 DIAGNOSIS — D509 Iron deficiency anemia, unspecified: Secondary | ICD-10-CM | POA: Insufficient documentation

## 2018-01-18 DIAGNOSIS — M7918 Myalgia, other site: Secondary | ICD-10-CM

## 2018-01-18 DIAGNOSIS — M6250 Muscle wasting and atrophy, not elsewhere classified, unspecified site: Secondary | ICD-10-CM | POA: Insufficient documentation

## 2018-01-18 DIAGNOSIS — M4316 Spondylolisthesis, lumbar region: Secondary | ICD-10-CM | POA: Insufficient documentation

## 2018-01-18 DIAGNOSIS — M47814 Spondylosis without myelopathy or radiculopathy, thoracic region: Secondary | ICD-10-CM | POA: Insufficient documentation

## 2018-01-18 DIAGNOSIS — M48061 Spinal stenosis, lumbar region without neurogenic claudication: Secondary | ICD-10-CM | POA: Insufficient documentation

## 2018-01-18 DIAGNOSIS — Z91012 Allergy to eggs: Secondary | ICD-10-CM | POA: Insufficient documentation

## 2018-01-18 DIAGNOSIS — E559 Vitamin D deficiency, unspecified: Secondary | ICD-10-CM | POA: Insufficient documentation

## 2018-01-18 DIAGNOSIS — Z981 Arthrodesis status: Secondary | ICD-10-CM | POA: Insufficient documentation

## 2018-01-18 DIAGNOSIS — E1122 Type 2 diabetes mellitus with diabetic chronic kidney disease: Secondary | ICD-10-CM | POA: Diagnosis not present

## 2018-01-18 DIAGNOSIS — Z794 Long term (current) use of insulin: Secondary | ICD-10-CM | POA: Diagnosis not present

## 2018-01-18 DIAGNOSIS — Z9689 Presence of other specified functional implants: Secondary | ICD-10-CM | POA: Insufficient documentation

## 2018-01-18 DIAGNOSIS — R918 Other nonspecific abnormal finding of lung field: Secondary | ICD-10-CM | POA: Insufficient documentation

## 2018-01-18 DIAGNOSIS — M47896 Other spondylosis, lumbar region: Secondary | ICD-10-CM | POA: Diagnosis not present

## 2018-01-18 DIAGNOSIS — Z833 Family history of diabetes mellitus: Secondary | ICD-10-CM | POA: Insufficient documentation

## 2018-01-18 DIAGNOSIS — Z91013 Allergy to seafood: Secondary | ICD-10-CM | POA: Insufficient documentation

## 2018-01-18 DIAGNOSIS — M25571 Pain in right ankle and joints of right foot: Secondary | ICD-10-CM | POA: Insufficient documentation

## 2018-01-18 DIAGNOSIS — K3184 Gastroparesis: Secondary | ICD-10-CM | POA: Insufficient documentation

## 2018-01-18 DIAGNOSIS — E1121 Type 2 diabetes mellitus with diabetic nephropathy: Secondary | ICD-10-CM | POA: Diagnosis not present

## 2018-01-18 DIAGNOSIS — M79604 Pain in right leg: Secondary | ICD-10-CM | POA: Diagnosis not present

## 2018-01-18 DIAGNOSIS — Z91048 Other nonmedicinal substance allergy status: Secondary | ICD-10-CM | POA: Insufficient documentation

## 2018-01-18 DIAGNOSIS — Z809 Family history of malignant neoplasm, unspecified: Secondary | ICD-10-CM | POA: Insufficient documentation

## 2018-01-18 DIAGNOSIS — I129 Hypertensive chronic kidney disease with stage 1 through stage 4 chronic kidney disease, or unspecified chronic kidney disease: Secondary | ICD-10-CM | POA: Diagnosis not present

## 2018-01-18 DIAGNOSIS — M542 Cervicalgia: Secondary | ICD-10-CM | POA: Diagnosis not present

## 2018-01-18 DIAGNOSIS — Z8249 Family history of ischemic heart disease and other diseases of the circulatory system: Secondary | ICD-10-CM | POA: Insufficient documentation

## 2018-01-18 DIAGNOSIS — E11319 Type 2 diabetes mellitus with unspecified diabetic retinopathy without macular edema: Secondary | ICD-10-CM | POA: Insufficient documentation

## 2018-01-18 DIAGNOSIS — M25561 Pain in right knee: Secondary | ICD-10-CM | POA: Insufficient documentation

## 2018-01-18 DIAGNOSIS — Z841 Family history of disorders of kidney and ureter: Secondary | ICD-10-CM | POA: Insufficient documentation

## 2018-01-18 DIAGNOSIS — Z79891 Long term (current) use of opiate analgesic: Secondary | ICD-10-CM | POA: Insufficient documentation

## 2018-01-18 DIAGNOSIS — G8918 Other acute postprocedural pain: Secondary | ICD-10-CM | POA: Diagnosis not present

## 2018-01-18 DIAGNOSIS — R531 Weakness: Secondary | ICD-10-CM | POA: Insufficient documentation

## 2018-01-18 DIAGNOSIS — Z888 Allergy status to other drugs, medicaments and biological substances status: Secondary | ICD-10-CM | POA: Insufficient documentation

## 2018-01-18 DIAGNOSIS — Z6841 Body Mass Index (BMI) 40.0 and over, adult: Secondary | ICD-10-CM | POA: Insufficient documentation

## 2018-01-18 DIAGNOSIS — Z9049 Acquired absence of other specified parts of digestive tract: Secondary | ICD-10-CM | POA: Insufficient documentation

## 2018-01-18 DIAGNOSIS — Z90710 Acquired absence of both cervix and uterus: Secondary | ICD-10-CM | POA: Insufficient documentation

## 2018-01-18 DIAGNOSIS — Z79899 Other long term (current) drug therapy: Secondary | ICD-10-CM | POA: Insufficient documentation

## 2018-01-18 MED ORDER — TIZANIDINE HCL 4 MG PO TABS: 4.0000 mg | ORAL_TABLET | Freq: Two times a day (BID) | ORAL | 1 refills | Status: DC | PRN

## 2018-01-18 NOTE — Patient Instructions (Addendum)
1. Zanaflex Rx 2. Stop Flexeril 3. Schedule left cervical TPI  ____________________________________________________________________________________________  Risk(s) and Possible Complications  Patient Responsibilities: It is important that you read this as it is part of your informed consent. It is our duty to inform you of the risks and possible complications associated with treatments offered to you. It is your responsibility as a patient to read this and to ask questions about anything that is not clear or that you believe was not covered in this document.  Patient's Rights: You have the right to refuse treatment. You also have the right to change your mind, even after initially having agreed to have the treatment done. However, under this last option, if you wait until the last second to change your mind, you may be charged for the materials used up to that point.  Introduction: Medicine is not an Chief Strategy Officer. Everything in Medicine, including the lack of treatment(s), carries the potential for danger, harm, or loss (which is by definition: Risk). In Medicine, a complication is a secondary problem, condition, or disease that can aggravate an already existing one. All treatments carry the risk of possible complications. The fact that a side effects or complications occurs, does not imply that the treatment was conducted incorrectly. It must be clearly understood that these can happen even when everything is done following the highest safety standards.  No treatment: You can choose not to proceed with the proposed treatment alternative. The "PRO(s)" would include: avoiding the risk of complications associated with the therapy. The "CON(s)" would include: not getting any of the treatment benefits. These benefits fall under one of three categories: diagnostic; therapeutic; and/or palliative. Diagnostic benefits include: getting information which can ultimately lead to improvement of the disease or  symptom(s). Therapeutic benefits are those associated with the successful treatment of the disease. Finally, palliative benefits are those related to the decrease of the primary symptoms, without necessarily curing the condition (example: decreasing the pain from a flare-up of a chronic condition, such as incurable terminal cancer).  General Risks and Complications: These are associated to most interventional treatments. They can occur alone, or in combination. They fall under one of the following six (6) categories: no benefit or worsening of symptoms; bleeding; infection; nerve damage; allergic reactions; and/or death. 1. No benefits or worsening of symptoms: In Medicine there are no guarantees, only probabilities. No healthcare provider can ever guarantee that a medical treatment will work, they can only state the probability that it may. Furthermore, there is always the possibility that the condition may worsen, either directly, or indirectly, as a consequence of the treatment. 2. Bleeding: This is more common if the patient is taking a blood thinner, either prescription or over the counter (example: Goody Powders, Fish oil, Aspirin, Garlic, etc.), or if suffering a condition associated with impaired coagulation (example: Hemophilia, cirrhosis of the liver, low platelet counts, etc.). However, even if you do not have one on these, it can still happen. If you have any of these conditions, or take one of these drugs, make sure to notify your treating physician. 3. Infection: This is more common in patients with a compromised immune system, either due to disease (example: diabetes, cancer, human immunodeficiency virus [HIV], etc.), or due to medications or treatments (example: therapies used to treat cancer and rheumatological diseases). However, even if you do not have one on these, it can still happen. If you have any of these conditions, or take one of these drugs, make sure to notify your  treating  physician. 4. Nerve Damage: This is more common when the treatment is an invasive one, but it can also happen with the use of medications, such as those used in the treatment of cancer. The damage can occur to small secondary nerves, or to large primary ones, such as those in the spinal cord and brain. This damage may be temporary or permanent and it may lead to impairments that can range from temporary numbness to permanent paralysis and/or brain death. 5. Allergic Reactions: Any time a substance or material comes in contact with our body, there is the possibility of an allergic reaction. These can range from a mild skin rash (contact dermatitis) to a severe systemic reaction (anaphylactic reaction), which can result in death. 6. Death: In general, any medical intervention can result in death, most of the time due to an unforeseen complication. ____________________________________________________________________________________________    Trigger Point Injection Trigger points are areas where you have pain. A trigger point injection is a shot given in the trigger point to help relieve pain for a few days to a few months. Common places for trigger points include:  The neck.  The shoulders.  The upper back.  The lower back.  A trigger point injection will not cure long-lasting (chronic) pain permanently. These injections do not always work for every person, but for some people they can help to relieve pain for a few days to a few months. Tell a health care provider about:  Any allergies you have.  All medicines you are taking, including vitamins, herbs, eye drops, creams, and over-the-counter medicines.  Any problems you or family members have had with anesthetic medicines.  Any blood disorders you have.  Any surgeries you have had.  Any medical conditions you have. What are the risks? Generally, this is a safe procedure. However, problems may occur,  including:  Infection.  Bleeding.  Allergic reaction to the injected medicine.  Irritation of the skin around the injection site.  What happens before the procedure?  Ask your health care provider about changing or stopping your regular medicines. This is especially important if you are taking diabetes medicines or blood thinners. What happens during the procedure?  Your health care provider will feel for trigger points. A marker may be used to circle the area for the injection.  The skin over the trigger point will be washed with a germ-killing (antiseptic) solution.  A thin needle is used for the shot. You may feel pain or a twitching feeling when the needle enters the trigger point.  A numbing solution may be injected into the trigger point. Sometimes a medicine to keep down swelling, redness, and warmth (inflammation) is also injected.  Your health care provider may move the needle around the area where the trigger point is located until the tightness and twitching goes away.  After the injection, your health care provider may put gentle pressure over the injection site.  The injection site will be covered with a bandage (dressing). The procedure may vary among health care providers and hospitals. What happens after the procedure?  The dressing can be taken off in a few hours or as told by your health care provider.  You may feel sore and stiff for 1-2 days. This information is not intended to replace advice given to you by your health care provider. Make sure you discuss any questions you have with your health care provider. Document Released: 12/02/2011 Document Revised: 08/15/2016 Document Reviewed: 06/02/2015 [employer] Interactive Patient Education  2018 [employer]  

## 2018-01-18 NOTE — Progress Notes (Signed)
Patient's Name: Brittney [name]  MRN: [id number]  Referring Provider: Rusty Aus, MD  DOB: 11/06/1951  PCP: Rusty Aus, MD  DOS: 01/18/2018  Note by: [name], MD  Service setting: Ambulatory outpatient  Specialty: Interventional Pain Management  Location: [facility] (AMB) Pain Management Facility    Patient type: Established   Primary Reason(s) for Visit: Evaluation of chronic illnesses with exacerbation, or progression (Level of risk: moderate) CC: Back Pain (lower bilateral, left is worse); Knee Pain (right); Arm Pain (left); and Ankle Pain (right)  HPI  Brittney [name] is a 67 y.o. year old, female patient, who comes today for a follow-up evaluation. She has Abnormal tumor markers; Chronic kidney disease, stage II (mild); Degenerative spondylolisthesis; Diabetes mellitus with peripheral vascular disease ([place]); Diabetes with retinopathy ([place]); Diabetic nephropathy ([place]); DM (diabetes mellitus) type II uncontrolled, periph vascular disorder ([place]); Fibromyalgia; Gastroparesis due to DM ([place]); Generalized osteoarthritis of multiple sites; Hepatic cirrhosis ([place]); Hyperlipemia; Lung nodule, multiple; Microalbuminuria; OSA (obstructive sleep apnea); Seronegative arthritis; Severe uncontrolled hypertension; L3-4 severe lumbar spinal stenosis (12/31/2016 MRI); Type 2 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, with long-term current use of insulin ([place]); Unilateral small kidney; Long term current use of opiate analgesic; Long term prescription opiate use; Opiate use; Chronic pain syndrome; Morbid obesity with BMI of 40.0-44.9, adult ([place]); Type 2 diabetes mellitus with diabetic nephropathy, with long-term current use of insulin ([place]); Chronic low back pain (Location of Primary Source of Pain) (Bilateral) (R>L); 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 Brittney [name] was last seen on 10/18/2017. Her primarily concern today is the Back Pain (lower bilateral, left is worse); Knee Pain (right); Arm Pain (left); and Ankle Pain (right)  Pain Assessment: Location: Lower, Left, Right(see visit info for other pain sites) Back Radiating: patient reports that she has "pain overload"   Onset: More than a month ago Duration: Chronic pain Quality: Constant, Aching, Throbbing(difficult to put into words, moaning, groaning, almost in tears and is unsure what to do about this pain. ) Severity: 9 /10 (self-reported pain score)   Effect on ADL: at her wits end.  difficult mobility Timing: Constant Modifying factors: SCS seems to be helping knee pain but nothing else.   Brittney [name] presents today for follow-up.  She is status post spinal cord stimulator surgical implant with Dr. Cari Caraway on 12/01/2017.  This was a paddle implant which required a laminectomy.  Patient recently saw Dr. Olive Bass at [facility] pain psychology which he finds very helpful.  Brittney [name] is somewhat frustrated with the lack of pain relief that she is experiencing for her axial low back pain.  She states that the surgery was more painful than she though and that she had significant postoperative pain.  She states that the SCS implant has only been helpful for her right knee pain is not experiencing significant pain relief for axial low back.  NEVRO spinal cord stimulator rep present today we will try and optimize patient's SCS settings to target axial low back pain.  We will also obtain x-rays to ensure  SCS paddle in correct  location.  Further details on both, my assessment(s), as well as the proposed treatment plan, please see below.  Laboratory Chemistry  Inflammation Markers (CRP: Acute Phase) (ESR: Chronic Phase) Lab Results  Component Value Date   CRP 1.2 (H) 12/07/2016   ESRSEDRATE 56 (H) 12/07/2016                 Rheumatology Markers No results found for: Elayne Guerin, [facility]              Renal Function Markers Lab Results  Component Value Date   BUN 14 10/28/2017   CREATININE 0.60 10/28/2017   GFRAA >60 10/28/2017   GFRNONAA >60 10/28/2017                 Hepatic Function Markers Lab Results  Component Value Date   AST 29 10/28/2017   ALT 24 10/28/2017   ALBUMIN 3.4 (L) 10/28/2017   ALKPHOS 103 10/28/2017   LIPASE 114 10/22/2014                 Electrolytes Lab Results  Component Value Date   NA 137 10/28/2017   K 3.8 10/28/2017   CL 101 10/28/2017   CALCIUM 9.5 10/28/2017   MG 1.8 12/07/2016                 Neuropathy Markers Lab Results  Component Value Date   VITAMINB12 787 12/07/2016   HGBA1C 9.3 (H) 10/23/2014                 Bone Pathology Markers Lab Results  Component Value Date   25OHVITD1 26 (L) 12/07/2016   25OHVITD2 1.2 12/07/2016   25OHVITD3 25 12/07/2016                 Coagulation Parameters Lab Results  Component Value Date   INR 0.95 09/20/2016   LABPROT 12.7 09/20/2016   APTT 34.9 10/22/2014   PLT 188 10/05/2017                 Cardiovascular Markers Lab Results  Component Value Date   CKTOTAL 161 10/23/2014   CKMB 1.0 10/23/2014   TROPONINI 0.06 (H) 10/23/2014   HGB 12.3 10/05/2017   HCT 37.7 10/05/2017                 CA Markers No results found for: CEA, CA125, LABCA2               Note: Lab results reviewed.  Recent Diagnostic Imaging Review  Cervical Imaging: . Cervical DG 1 view:  Results for orders placed in visit on 03/26/03  DG Cervical Spine 1 View   Narrative FINDINGS CLINICAL  DATA:  C5-6 ACDF. C-ARM C-ARM FLUOROSCOPY WAS PROVIDED. IMPRESSION C-ARM FLUOROSCOPY WAS PROVIDED. PORTABLE LATERAL CERVICAL SPINE A SINGLE SOMEWHAT OBLIQUE VIEW SHOWS PERFORMANCE OF ANTERIOR CERVICAL DISKECTOMY AND FUSION AT C5- 6.  THE ANTERIOR PLATE APPEARS GROSSLY WELL POSITIONED.  DETAIL DOES NOT REALLY PERMIT EVALUATION OF THE INTERBODY ALLOGRAFT. IMPRESSION ACDF C5-6.   Cervical DG 2-3 views:  Results for orders placed in visit on 05/21/03  DG Cervical Spine 2-3 Views   Narrative FINDINGS CLINICAL DATA:  CERVICAL SPINE FUSION, MARCH OF 2004, NOW WITH PAIN. THREE VIEWS OF CERVICAL SPINE THREE VIEWS OF THE CERVICAL SPINE WERE OBTAINED, INCLUDING NEUTRAL LATERAL VIEW AND VIEWS IN FLEXION AND EXTENSION.  IN THE NEUTRAL LATERAL VIEW, ANTERIOR FUSION IS NOTED AT C5-6.  THE ANTERIOR  METALLIC FUSION PLATE AND INTERBODY PLUG ARE IN GOOD POSITION, AND NORMAL HEIGHT IS MAINTAINED.  NORMAL ALIGNMENT IS PRESENT.  THROUGH FLEXION AND EXTENSION, THERE IS SLIGHTLY LIMITED MOTION, BUT NO MALALIGNMENT IS SEEN. NO SIGNIFICANT PREVERTEBRAL SOFT TISSUE SWELLING IS NOTED. IMPRESSION ANTERIOR FUSION AT C5-6.  SLIGHTLY LIMITED RANGE OF MOTION THROUGH FLEXION AND EXTENSION BUT NO MALALIGNMENT.    Thoracic Imaging: Thoracic MR wo contrast:  Results for orders placed during the hospital encounter of 08/18/17  MR THORACIC SPINE WO CONTRAST   Narrative CLINICAL DATA:  Low back pain, RIGHT leg numbness with occasional pain. History of L4-5 laminectomy. Assess prior to placement of neurostimulator.  EXAM: MRI THORACIC WITHOUT CONTRAST  MRI LUMBAR SPINE WITHOUT AND WITH CONTRAST  TECHNIQUE: Multiplanar and multiecho pulse sequences of the thoracic spine were obtained without intravenous contrast.  Multiplanar and multiecho pulse sequences of the thoracic and lumbar spine were obtained without and with intravenous contrast.  CONTRAST:  83m MULTIHANCE GADOBENATE DIMEGLUMINE 529 MG/ML IV  SOLN  COMPARISON:  CT chest, abdomen and pelvis May 25, 2017 and MRI of the lumbar spine December 31, 2016  FINDINGS: MRI THORACIC SPINE FINDINGS  ALIGNMENT: Maintenance of the thoracic kyphosis. No malalignment.  VERTEBRAE/DISCS: Status post C5-6 approximate ACDF, susceptibility artifact limits precise numbering. Vertebral bodies are intact. Mild disc desiccation all levels with mild chronic discogenic endplate changes. Scattered predominately bright T1 and bright STIR signal hemangioma.  CORD: Thoracic spinal cord is normal morphology and signal characteristics.  PREVERTEBRAL AND PARASPINAL SOFT TISSUES:  Nonacute.  DISC LEVELS:  T1-2 through T6-7: No disc bulge, canal stenosis nor neural foraminal narrowing.  T7-8: Small central disc protrusion without canal stenosis or neural foraminal narrowing.  T8-9 in T9-10: No disc bulge, canal stenosis nor neural foraminal narrowing.  T10-11: Annular bulging. No canal stenosis or neural foraminal narrowing.  T11-12 and T12-L1: No disc bulge, canal stenosis nor neural foraminal narrowing. Mild facet arthropathy.  MRI LUMBAR SPINE FINDINGS  SEGMENTATION: For the purposes of this report, the last well-formed intervertebral disc will be reported as L5-S1.  ALIGNMENT: Maintained lumbar lordosis. Stable grade 1 L4-5 anterolisthesis.  VERTEBRAE:Vertebral bodies intact. L3 through L5 PLIF, hardware results in susceptibility artifact. Non surgically altered discs demonstrate normal morphology, slight desiccation. No abnormal or acute bone marrow signal. No abnormal osseous or disc enhancement.  CONUS MEDULLARIS: Conus medullaris terminates at T12-L1 and demonstrates normal morphology and signal characteristics. Cauda equina is normal. No abnormal cord, leptomeningeal or epidural enhancement.  PARASPINAL AND SOFT TISSUES: Severe paraspinal muscle atrophy at and below the level surgical intervention. Newly atrophic  RIGHT iliopsoas muscle.  DISC LEVELS:  L1-2: No disc bulge, canal stenosis nor neural foraminal narrowing. Mild facet arthropathy.  L2-3: Small similar RIGHT subarticular disc protrusion. Mild facet arthropathy and ligamentum flavum redundancy. Mild canal stenosis. Minimal RIGHT neural foraminal narrowing.  L3-4: New PLIF and posterior decompression. New RIGHT lateral plate and screw fixation, the plate extends 2 cm laterally into the psoas muscle and may affect the exited RIGHT L3 nerve. Minimal canal stenosis. No residual synovial cyst. Mild neural foraminal narrowing.  L4-5: PLIF. Anterolisthesis. Posterior decompression. Mild neural foraminal narrowing may be overestimated by hardware artifact.  L5-S1: No disc bulge, canal stenosis nor neural foraminal narrowing. Moderate to severe facet arthropathy with trace effusions which are likely reactive. No canal stenosis. Mild LEFT neural foraminal narrowing may be overestimated by hardware artifact.  IMPRESSION: MRI thoracic spine:  1. Degenerative change without canal stenosis or neural foraminal narrowing.  MRI lumbar spine:  1. Interval L3-4 PLIF. RIGHT lateral L3-4 plate and screw fixation ; plate has backed out 2 cm into the iliopsoas muscle and may affect the exited RIGHT L3 nerve. New RIGHT iliopsoas muscle atrophy. 2. L4-5 PLIF, stable grade 1 L4-5 anterolisthesis. 3. Minimal canal stenosis L3-4, mild at L2-3. Minimal to mild L2-3 through L5-S1 neural foraminal narrowing.   Electronically Signed   By: Elon Alas M.D.   On: 08/18/2017 20:25     Results for orders placed during the hospital encounter of 08/18/17  MR Lumbar Spine W Wo Contrast   Narrative CLINICAL DATA:  Low back pain, RIGHT leg numbness with occasional pain. History of L4-5 laminectomy. Assess prior to placement of neurostimulator.  EXAM: MRI THORACIC WITHOUT CONTRAST  MRI LUMBAR SPINE WITHOUT AND WITH  CONTRAST  TECHNIQUE: Multiplanar and multiecho pulse sequences of the thoracic spine were obtained without intravenous contrast.  Multiplanar and multiecho pulse sequences of the thoracic and lumbar spine were obtained without and with intravenous contrast.  CONTRAST:  71m MULTIHANCE GADOBENATE DIMEGLUMINE 529 MG/ML IV SOLN  COMPARISON:  CT chest, abdomen and pelvis May 25, 2017 and MRI of the lumbar spine December 31, 2016  FINDINGS: MRI THORACIC SPINE FINDINGS  ALIGNMENT: Maintenance of the thoracic kyphosis. No malalignment.  VERTEBRAE/DISCS: Status post C5-6 approximate ACDF, susceptibility artifact limits precise numbering. Vertebral bodies are intact. Mild disc desiccation all levels with mild chronic discogenic endplate changes. Scattered predominately bright T1 and bright STIR signal hemangioma.  CORD: Thoracic spinal cord is normal morphology and signal characteristics.  PREVERTEBRAL AND PARASPINAL SOFT TISSUES:  Nonacute.  DISC LEVELS:  T1-2 through T6-7: No disc bulge, canal stenosis nor neural foraminal narrowing.  T7-8: Small central disc protrusion without canal stenosis or neural foraminal narrowing.  T8-9 in T9-10: No disc bulge, canal stenosis nor neural foraminal narrowing.  T10-11: Annular bulging. No canal stenosis or neural foraminal narrowing.  T11-12 and T12-L1: No disc bulge, canal stenosis nor neural foraminal narrowing. Mild facet arthropathy.  MRI LUMBAR SPINE FINDINGS  SEGMENTATION: For the purposes of this report, the last well-formed intervertebral disc will be reported as L5-S1.  ALIGNMENT: Maintained lumbar lordosis. Stable grade 1 L4-5 anterolisthesis.  VERTEBRAE:Vertebral bodies intact. L3 through L5 PLIF, hardware results in susceptibility artifact. Non surgically altered discs demonstrate normal morphology, slight desiccation. No abnormal or acute bone marrow signal. No abnormal osseous or disc enhancement.  CONUS  MEDULLARIS: Conus medullaris terminates at T12-L1 and demonstrates normal morphology and signal characteristics. Cauda equina is normal. No abnormal cord, leptomeningeal or epidural enhancement.  PARASPINAL AND SOFT TISSUES: Severe paraspinal muscle atrophy at and below the level surgical intervention. Newly atrophic RIGHT iliopsoas muscle.  DISC LEVELS:  L1-2: No disc bulge, canal stenosis nor neural foraminal narrowing. Mild facet arthropathy.  L2-3: Small similar RIGHT subarticular disc protrusion. Mild facet arthropathy and ligamentum flavum redundancy. Mild canal stenosis. Minimal RIGHT neural foraminal narrowing.  L3-4: New PLIF and posterior decompression. New RIGHT lateral plate and screw fixation, the plate extends 2 cm laterally into the psoas muscle and may affect the exited RIGHT L3 nerve. Minimal canal stenosis. No residual synovial cyst. Mild neural foraminal narrowing.  L4-5: PLIF. Anterolisthesis. Posterior decompression. Mild neural foraminal narrowing may be overestimated by hardware artifact.  L5-S1: No disc bulge, canal stenosis nor neural foraminal narrowing. Moderate to severe facet arthropathy with trace effusions which are likely reactive. No canal stenosis. Mild LEFT neural foraminal narrowing may be overestimated by hardware artifact.  IMPRESSION:  MRI thoracic spine:  1. Degenerative change without canal stenosis or neural foraminal narrowing. MRI lumbar spine:  1. Interval L3-4 PLIF. RIGHT lateral L3-4 plate and screw fixation ; plate has backed out 2 cm into the iliopsoas muscle and may affect the exited RIGHT L3 nerve. New RIGHT iliopsoas muscle atrophy. 2. L4-5 PLIF, stable grade 1 L4-5 anterolisthesis. 3. Minimal canal stenosis L3-4, mild at L2-3. Minimal to mild L2-3 through L5-S1 neural foraminal narrowing.   Electronically Signed   By: Elon Alas M.D.   On: 08/18/2017 20:25    Lumbar CT wo contrast:  Results for orders  placed during the hospital encounter of 02/18/17  CT LUMBAR SPINE WO CONTRAST   Narrative CLINICAL DATA:  Low back pain  EXAM: CT LUMBAR SPINE WITHOUT CONTRAST  TECHNIQUE: Multidetector CT imaging of the lumbar spine was performed without intravenous contrast administration. Multiplanar CT image reconstructions were also generated.  COMPARISON:  MRI 05/10/2017  FINDINGS: Segmentation: 5 lumbar type vertebrae.  Alignment: Mild levoscoliosis.  Fused L4-5 anterolisthesis.  Vertebrae: Remote L4 superior endplate fracture with right preferential height loss. The right L4 pedicle screw reaches the superior endplate. No acute fracture noted. No evidence of discitis or aggressive bone lesion.  Paraspinal and other soft tissues: Punctate left renal calculus. Atherosclerotic calcification. Colonic diverticulosis.  Disc levels:  T12- L1: Minimal spondylosis.  Mild facet spurring.  No impingement  L1-L2: Minimal spondylosis.  Mild facet spurring.  No impingement  L2-L3: Minimal spondylosis. Facet hypertrophy. Mild triangular narrowing of the thecal sac.  L3-L4: Severe facet arthropathy with hypertrophy and spurring. Disc narrowing and bulging. Advanced spinal stenosis. A least moderate bilateral foraminal stenosis.  L4-L5: PLIF with solid bony fusion. Posterior decompression. No residual impingement.  L5-S1:Facet arthropathy with moderate spurring. Spondylosis with ventral osteophyte. Patent spinal canal. Mild bilateral foraminal narrowing.  IMPRESSION: 1. L3-4 advanced adjacent segment facet arthropathy with advanced spinal stenosis. Moderate biforaminal narrowing. 2. L4-5 PLIF with solid bony fusion. 3. Remote L4 superior endplate fracture. The right L4 pedicle screw tip is at the superior endplate.   Electronically Signed   By: Monte Fantasia M.D.   On: 02/18/2017 14:27    Lumbar DG 2-3 views:  Results for orders placed during the hospital encounter of 11/02/17   DG Lumbar Spine 2-3 Views   Narrative CLINICAL DATA:  Surgery  EXAM: DG C-ARM GT 120 MIN; LUMBAR SPINE - 2-3 VIEW  CONTRAST:  None  FLUOROSCOPY TIME:  Fluoroscopy Time:  2 minutes 30 seconds  Radiation Exposure Index (if provided by the fluoroscopic device): Not provided  Number of Acquired Spot Images: 8  COMPARISON:  10/18/2017  FINDINGS: Submitted images demonstrate removal of previously identified intraspinal leads.  New intraspinal stimulator is identified with leads projecting over the caudal aspect of the thoracic spinal canal question at T10-T11.  Landmarks are difficult to visualize due to degree of demineralization.  IMPRESSION: New intraspinal stimulator with leads projecting at approximately T10-T11.   Electronically Signed   By: Lavonia Dana M.D.   On: 11/02/2017 12:04     Complexity Note: Imaging results reviewed. Results shared with Ms. Alderman, using Layman's terms.                         Meds   Current Outpatient Medications:  .  albuterol (VENTOLIN HFA) 108 (90 Base) MCG/ACT inhaler, TAKE 2 PUFFS BY MOUTH 3 TIMES A DAY AS NEEDED FOR SHORTNESS OF BREATH, Disp: , Rfl:  .  ALPRAZolam (XANAX) 0.5 MG tablet, Take 0.5 mg by mouth 4 (four) times daily. , Disp: , Rfl:  .  bumetanide (BUMEX) 1 MG tablet, Take by mouth daily. , Disp: , Rfl:  .  butalbital-acetaminophen-caffeine (FIORICET, ESGIC) 50-325-40 MG tablet, TAKE ONE TABLET BY MOUTH ONCE DAILY, Disp: , Rfl:  .  candesartan (ATACAND) 32 MG tablet, Take 1 tablet (32 mg total) by mouth daily., Disp: 30 tablet, Rfl: 11 .  carvedilol (COREG) 25 MG tablet, Take 37.5-50 mg by mouth 2 (two) times daily with a meal. TAKES 2 TABLETS IN THE MORNING AND 1.5 TABLETS IN THE EVENING, Disp: , Rfl:  .  celecoxib (CELEBREX) 200 MG capsule, Take 1 capsule (200 mg total) 2 (two) times daily by mouth., Disp: 8 capsule, Rfl: 0 .  cloNIDine (CATAPRES - DOSED IN MG/24 HR) 0.3 mg/24hr patch, Place 0.3 mg onto the skin once  a week. , Disp: , Rfl:  .  diclofenac sodium (VOLTAREN) 1 % GEL, Apply 4 g topically as needed (PAIN). , Disp: , Rfl:  .  doxazosin (CARDURA) 4 MG tablet, Take 4 mg by mouth at bedtime. , Disp: , Rfl:  .  doxycycline (VIBRA-TABS) 100 MG tablet, Take 1 tablet (100 mg total) by mouth 2 (two) times daily., Disp: 14 tablet, Rfl: 0 .  escitalopram (LEXAPRO) 10 MG tablet, Take 10 mg by mouth daily. , Disp: , Rfl:  .  fluticasone (FLONASE) 50 MCG/ACT nasal spray, PLACE 2 SPRAYS INTO BOTH NOSTRILS 2 (TWO) TIMES DAILY AS NEEDED FOR ALLERGIES, Disp: , Rfl:  .  gabapentin (NEURONTIN) 600 MG tablet, Take 1 tablet (600 mg total) by mouth 6 (six) times daily. (Patient taking differently: Take 600 mg by mouth See admin instructions. ), Disp: 540 tablet, Rfl: 0 .  hydrALAZINE (APRESOLINE) 25 MG tablet, Take 25 mg by mouth at bedtime. , Disp: , Rfl:  .  Insulin Detemir (LEVEMIR FLEXTOUCH) 100 UNIT/ML Pen, INJECT 90 UNITS SUBCUTANEOUSLY TWICE A DAY, Disp: , Rfl:  .  Insulin Pen Needle (FIFTY50 PEN NEEDLES) 32G X 6 MM MISC, 5 (five) times daily., Disp: , Rfl:  .  insulin regular human CONCENTRATED (HUMULIN R) 500 UNIT/ML kwikpen, Inject 70-75 Units into the skin 3 (three) times daily with meals. PER SLIDING SCALE, Disp: , Rfl:  .  ketoconazole (NIZORAL) 2 % shampoo, APPLY TO AFFECTED AREAS (LEAVE ON FOR 5 MINUTES) , RINSE WELL, USE 2-3 TIMES PER WEEK., Disp: , Rfl: 11 .  lidocaine (LIDODERM) 5 %, PLACE 1 PATCH ONTO THE MOST PAINFUL AREA OF SKIN DAILY FOR UP TO 12 HOURS IN A 24 HOUR PERIOD, Disp: , Rfl: 5 .  lubiprostone (AMITIZA) 24 MCG capsule, Take 48 mcg by mouth daily., Disp: , Rfl:  .  methocarbamol (ROBAXIN) 750 MG tablet, Take 1 tablet (750 mg total) every 6 (six) hours as needed by mouth for muscle spasms., Disp: 120 tablet, Rfl: 0 .  metoCLOPramide (REGLAN) 10 MG tablet, Take 10 mg by mouth 3 (three) times daily., Disp: , Rfl:  .  pantoprazole (PROTONIX) 40 MG tablet, TAKE 1 TABLET (40 MG TOTAL) BY MOUTH 2  (TWO) TIMES DAILY. TAKE ONE HOUR BEFORE A MEAL, Disp: , Rfl:  .  potassium chloride (K-DUR,KLOR-CON) 10 MEQ tablet, TAKE 1 TABLET BY MOUTH ONCE A DAY, Disp: , Rfl:  .  pramipexole (MIRAPEX) 0.5 MG tablet, TAKE 2 TABLETS BY MOUTH AT BEDTIME, Disp: , Rfl:  .  tiZANidine (ZANAFLEX) 4 MG tablet, Take 1 tablet (4 mg total)  by mouth 2 (two) times daily as needed for muscle spasms., Disp: 60 tablet, Rfl: 1 .  traMADol (ULTRAM) 50 MG tablet, Take 2 tablets (100 mg total) by mouth every 8 (eight) hours., Disp: 180 tablet, Rfl: 2 .  traZODone (DESYREL) 150 MG tablet, Take 150 mg by mouth at bedtime. , Disp: , Rfl:  .  zolpidem (AMBIEN) 10 MG tablet, Take 10 mg by mouth at bedtime. , Disp: , Rfl:   ROS  Constitutional: Denies any fever or chills Gastrointestinal: No reported hemesis, hematochezia, vomiting, or acute GI distress Musculoskeletal: Denies any acute onset joint swelling, redness, loss of ROM, or weakness Neurological: No reported episodes of acute onset apraxia, aphasia, dysarthria, agnosia, amnesia, paralysis, loss of coordination, or loss of consciousness  Allergies  Ms. Younce is allergic to eggs or egg-derived products; shellfish allergy; amlodipine; imdur [isosorbide dinitrate]; lyrica [pregabalin]; monosodium glutamate; and tape.  [place]  Drug: Ms. Stankowski  reports that she does not use drugs. Alcohol:  reports that she does not drink alcohol. Tobacco:  reports that  has never smoked. she has never used smokeless tobacco. Medical:  has a past medical history of Abdominal pain, left upper quadrant (12/11/2012), Arthritis, Asthma, Broken leg (2014), Cancer ([place]) (2007), Chronic lower back pain, Collagen vascular disease ([place]), Coronary artery disease, Diabetes mellitus without complication ([place]), Diabetic nephropathy associated with secondary diabetes mellitus ([place]), [place] (12/11/2012), Gastroparesis, Gross hematuria (12/11/2012), Hepatic cirrhosis ([place]), Hyperlipidemia, Hypertension, IBS  (irritable bowel syndrome), Kidney stone (12/11/2012), Lower extremity edema, Morbid obesity with BMI of 40.0-44.9, adult ([place]), Myocardial infarction ([place]) (09/2013), Nausea without vomiting (12/11/2012), Nephrolithiasis, Nephrolithiasis (04/26/2014), Nonproliferative retinopathy due to secondary diabetes ([place]), Numbness and tingling of right leg, Peripheral vascular disease ([place]), Renal colic (95/18/8416), Sciatica, Sciatica (12/11/2012), Skin cancer, Sleep apnea, and Unilateral small kidney without contralateral hypertrophy. Surgical: Ms. Tacey  has a past surgical history that includes Cesarean section (1980); Dilation and curettage of uterus; Eye surgery (1986); Abdominal hysterectomy; Tonsillectomy; Esophagogastroduodenoscopy (02/08/2014); Colonoscopy (05/04/2001); Esophagogastroduodenoscopy (egd) with propofol (N/A, 09/20/2016); Colonoscopy with propofol (N/A, 09/20/2016); Mohs surgery (11/2016); Cholecystectomy; Hernia repair; Joint replacement (Left, 09/24/2013); Back surgery (04/1999); and Pulse generator implant (N/A, 11/02/2017). Family: family history includes Asthma in her mother; Cancer in her mother; Diabetes in her father; Heart disease in her mother; Hypertension in her father; Kidney disease in her father.  Constitutional Exam  General appearance: Well nourished, well developed, and well hydrated. In no apparent acute distress Vitals:   01/18/18 1217  BP: (!) 153/63  Pulse: 65  Resp: 16  Temp: 98.4 F (36.9 C)  TempSrc: Oral  SpO2: 96%  Weight: 287 lb (130.2 kg)  Height: '5\' 6"'  (1.676 m)   BMI Assessment: Estimated body mass index is 46.32 kg/m as calculated from the following:   Height as of this encounter: '5\' 6"'  (1.676 m).   Weight as of this encounter: 287 lb (130.2 kg).  BMI interpretation table: BMI level Category Range association with higher incidence of chronic pain  <18 kg/m2 Underweight   18.5-24.9 kg/m2 Ideal body weight   25-29.9 kg/m2 Overweight Increased  incidence by 20%  30-34.9 kg/m2 Obese (Class I) Increased incidence by 68%  35-39.9 kg/m2 Severe obesity (Class II) Increased incidence by 136%  >40 kg/m2 Extreme obesity (Class III) Increased incidence by 254%   BMI Readings from Last 4 Encounters:  01/18/18 46.32 kg/m  01/02/18 46.81 kg/m  11/02/17 44.55 kg/m  10/28/17 44.55 kg/m   Wt Readings from Last 4 Encounters:  01/18/18 287 lb (  130.2 kg)  01/02/18 290 lb (131.5 kg)  11/02/17 276 lb (125.2 kg)  10/28/17 276 lb (125.2 kg)  Psych/Mental status: Alert, oriented x 3 (person, place, & time)       Eyes: PERLA Respiratory: No evidence of acute respiratory distress  Cervical Spine Area Exam  Skin & Axial Inspection: No masses, redness, edema, swelling, or associated skin lesions Alignment: Symmetrical Functional ROM: Unrestricted ROM      Stability: No instability detected Muscle Tone/Strength: Functionally intact. No obvious neuro-muscular anomalies detected. Sensory (Neurological): Unimpaired Palpation: No palpable anomalies              Upper Extremity (UE) Exam    Side: Right upper extremity  Side: Left upper extremity  Skin & Extremity Inspection: Skin color, temperature, and hair growth are WNL. No peripheral edema or cyanosis. No masses, redness, swelling, asymmetry, or associated skin lesions. No contractures.  Skin & Extremity Inspection: Skin color, temperature, and hair growth are WNL. No peripheral edema or cyanosis. No masses, redness, swelling, asymmetry, or associated skin lesions. No contractures.  Functional ROM: Unrestricted ROM          Functional ROM: Unrestricted ROM          Muscle Tone/Strength: Functionally intact. No obvious neuro-muscular anomalies detected.  Muscle Tone/Strength: Functionally intact. No obvious neuro-muscular anomalies detected.  Sensory (Neurological): Unimpaired          Sensory (Neurological): Unimpaired          Palpation: No palpable anomalies              Palpation: No palpable  anomalies              Specialized Test(s): Deferred         Specialized Test(s): Deferred          Thoracic Spine Area Exam  Skin & Axial Inspection: No masses, redness, or swelling Alignment: Symmetrical Functional ROM: Unrestricted ROM Stability: No instability detected Muscle Tone/Strength: Functionally intact. No obvious neuro-muscular anomalies detected. Sensory (Neurological): Unimpaired Muscle strength & Tone: No palpable anomalies  Lumbar Spine Area Exam  Skin & Axial Inspection: Well healed scar from previous spine surgery detected Alignment: Symmetrical Functional ROM: Decreased ROM      Stability: No instability detected Muscle Tone/Strength: Functionally intact. No obvious neuro-muscular anomalies detected. Sensory (Neurological): Dermatomal pain pattern Palpation: Complains of area being tender to palpation Bilateral Fist Percussion Test Provocative Tests: Lumbar Hyperextension and rotation test: Positive bilaterally for facet joint pain.R>L Lumbar Lateral bending test: Positive due to fusion restriction. Patrick's Maneuver: evaluation deferred today                    Gait & Posture Assessment  Ambulation: Patient ambulates using a walker Gait: Limited. Using assistive device to ambulate Posture: Difficulty standing up straight, due to pain   Lower Extremity Exam    Side: Right lower extremity  Side: Left lower extremity  Skin & Extremity Inspection: Skin color, temperature, and hair growth are WNL. No peripheral edema or cyanosis. No masses, redness, swelling, asymmetry, or associated skin lesions. No contractures.  Skin & Extremity Inspection: Skin color, temperature, and hair growth are WNL. No peripheral edema or cyanosis. No masses, redness, swelling, asymmetry, or associated skin lesions. No contractures.  Functional ROM: Decreased ROM          Functional ROM: Decreased ROM          Muscle Tone/Strength: Functionally intact. No obvious neuro-muscular anomalies  detected.  Muscle  Tone/Strength: Functionally intact. No obvious neuro-muscular anomalies detected.  Sensory (Neurological): Paresthesia (Tingling sensation)  Sensory (Neurological): Paresthesia (Burning sensation)  Palpation: No palpable anomalies  Palpation: No palpable anomalies  4 out of 5 strength right plantarflexion, dorsiflexion, hip flexion, knee extension. Assessment  Primary Diagnosis & Pertinent Problem List: The primary encounter diagnosis was Myofascial pain. 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  Status Diagnosis  Persistent Persistent Persistent 1. Myofascial pain   2. Failed back surgical syndrome   3. 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 Chronic lower extremity pain (Location of Secondary source of pain) (Bilateral) (R>L)   5. L3-4 severe lumbar facet hypertrophy and spinal stenosis   6. Neurogenic pain   7. Fibromyalgia       67 year old female status post lumbar spine fusion with chronic neuropathic axial low back and right leg radicular pain status post NEVRO performed on spinal cord stimulator paddle implant performed by Dr. Cari Caraway on December 01, 2017.  Patient somewhat frustrated that the spinal cord stimulator is not providing her with as much axial low back pain relief as she had experienced during her trial.  She does endorse some pain relief of her right knee pain.  X-ray was obtained today which showed the paddle had not migrated (around mid T9).  The NEVRO spinal cord stimulator representative and was also present today helped optimize programming so that we can hopefully cover her axial low back pain.  Patient has been seeing Dr. Olive Bass with [facility]  pain psychology which she finds very helpful.  I have encouraged her to continue seeing her.  We also had a discussion about the patient's trial and surgical experience associated with her spinal cord stimulator implant.  Patient is still having significant disability and weakness in her lower extremities.  She does have an appointment with Dr. Cari Caraway in March which will be one year from her previous lumbar spine surgery.   Regards to medication management, I will have the patient trial another muscle relaxant.  I have instructed her to stop Flexeril and try Zanaflex 4 mg twice daily as needed for muscle spasms.  Furthermore patient is endorsing left neck and trapezius pain.  She does have multiple trigger points in those regions.  This could be secondary to her fibromyalgia as well as her myofascial pain syndrome.  We discussed left cervical and trapezius trigger point injections.  Patient would like to proceed.  Plan: -X-ray of SCS paddle obtained which shows paddle lead at mid T9. -Optimization of spinal cord stimulator program to hopefully capture axial low back. -Encouraged patient to continue care with Dr. Olive Bass for pain coping strategies and CBT. -Trial of tizanidine.  Patient instructed to discontinue Flexeril. -Scheduled for left cervical/trapezius trigger point injections for myofascial pain syndrome.    Plan of Care  Pharmacotherapy (Medications Ordered): Meds ordered this encounter  Medications  . tiZANidine (ZANAFLEX) 4 MG tablet    Sig: Take 1 tablet (4 mg total) by mouth 2 (two) times daily as needed for muscle spasms.    Dispense:  60 tablet    Refill:  1   Lab-work, procedure(s), and/or referral(s): Orders Placed This Encounter  Procedures  . TRIGGER POINT INJECTION  . DG C-Arm 1-60 Min-No Report  Provider-requested follow-up: Return in about 2 weeks (around 02/01/2018) for Procedure. Time Note: Greater than 50% of the 25 minute(s) of face-to-face time spent  with Ms. Vialpando, was spent in counseling/coordination of care regarding: the treatment plan, treatment alternatives, the results, interpretation and significance of  her recent diagnostic interventional treatment(s), realistic expectations and the goals of pain management (increased in functionality). Future Appointments  Date Time Provider [place]  02/01/2018  9:00 AM [name], MD [facility] None  02/08/2018  1:00 PM [facility] None  02/08/2018  1:30 PM Lloyd Huger, MD [facility] None  02/08/2018  2:00 PM [facility]FUSION CHAIR 3 [facility] None    Primary Care Physician: Rusty Aus, MD Location: [facility] Note by: [name], M.D Date: 01/18/2018; Time: 3:14 PM  Patient Instructions  1. Zanaflex Rx 2. Stop Flexeril 3. Schedule left cervical TPI  ____________________________________________________________________________________________  Risk(s) and Possible Complications  Patient Responsibilities: It is important that you read this as it is part of your informed consent. It is our duty to inform you of the risks and possible complications associated with treatments offered to you. It is your responsibility as a patient to read this and to ask questions about anything that is not clear or that you believe was not covered in this document.  Patient's Rights: You have the right to refuse treatment. You also have the right to change your mind, even after initially having agreed to have the treatment done. However, under this last option, if you wait until the last second to change your mind, you may be charged for the materials used up to that point.  Introduction: Medicine is not an Chief Strategy Officer. Everything in Medicine, including the lack of treatment(s), carries the potential for danger, harm, or loss (which is by definition: Risk). In Medicine, a complication is a secondary problem, condition, or disease that can  aggravate an already existing one. All treatments carry the risk of possible complications. The fact that a side effects or complications occurs, does not imply that the treatment was conducted incorrectly. It must be clearly understood that these can happen even when everything is done following the highest safety standards.  No treatment: You can choose not to proceed with the proposed treatment alternative. The "PRO(s)" would include: avoiding the risk of complications associated with the therapy. The "CON(s)" would include: not getting any of the treatment benefits. These benefits fall under one of three categories: diagnostic; therapeutic; and/or palliative. Diagnostic benefits include: getting information which can ultimately lead to improvement of the disease or symptom(s). Therapeutic benefits are those associated with the successful treatment of the disease. Finally, palliative benefits are those related to the decrease of the primary symptoms, without necessarily curing the condition (example: decreasing the pain from a flare-up of a chronic condition, such as incurable terminal cancer).  General Risks and Complications: These are associated to most interventional treatments. They can occur alone, or in combination. They fall under one of the following six (6) categories: no benefit or worsening of symptoms; bleeding; infection; nerve damage; allergic reactions; and/or death. 1. No benefits or worsening of symptoms: In Medicine there are no guarantees, only probabilities. No healthcare provider can ever guarantee that a medical treatment will work, they can only state the probability that it may. Furthermore, there is always the possibility that the condition may worsen, either directly, or indirectly, as a consequence of the treatment. 2. Bleeding: This is more common if the patient is  taking a blood thinner, either prescription or over the counter (example: Goody Powders, Fish oil, Aspirin, Garlic,  etc.), or if suffering a condition associated with impaired coagulation (example: Hemophilia, cirrhosis of the liver, low platelet counts, etc.). However, even if you do not have one on these, it can still happen. If you have any of these conditions, or take one of these drugs, make sure to notify your treating physician. 3. Infection: This is more common in patients with a compromised immune system, either due to disease (example: diabetes, cancer, human immunodeficiency virus [HIV], etc.), or due to medications or treatments (example: therapies used to treat cancer and rheumatological diseases). However, even if you do not have one on these, it can still happen. If you have any of these conditions, or take one of these drugs, make sure to notify your treating physician. 4. Nerve Damage: This is more common when the treatment is an invasive one, but it can also happen with the use of medications, such as those used in the treatment of cancer. The damage can occur to small secondary nerves, or to large primary ones, such as those in the spinal cord and brain. This damage may be temporary or permanent and it may lead to impairments that can range from temporary numbness to permanent paralysis and/or brain death. 5. Allergic Reactions: Any time a substance or material comes in contact with our body, there is the possibility of an allergic reaction. These can range from a mild skin rash (contact dermatitis) to a severe systemic reaction (anaphylactic reaction), which can result in death. 6. Death: In general, any medical intervention can result in death, most of the time due to an unforeseen complication. ____________________________________________________________________________________________    Trigger Point Injection Trigger points are areas where you have pain. A trigger point injection is a shot given in the trigger point to help relieve pain for a few days to a few months. Common places for trigger  points include:  The neck.  The shoulders.  The upper back.  The lower back.  A trigger point injection will not cure long-lasting (chronic) pain permanently. These injections do not always work for every person, but for some people they can help to relieve pain for a few days to a few months. Tell a health care provider about:  Any allergies you have.  All medicines you are taking, including vitamins, herbs, eye drops, creams, and over-the-counter medicines.  Any problems you or family members have had with anesthetic medicines.  Any blood disorders you have.  Any surgeries you have had.  Any medical conditions you have. What are the risks? Generally, this is a safe procedure. However, problems may occur, including:  Infection.  Bleeding.  Allergic reaction to the injected medicine.  Irritation of the skin around the injection site.  What happens before the procedure?  Ask your health care provider about changing or stopping your regular medicines. This is especially important if you are taking diabetes medicines or blood thinners. What happens during the procedure?  Your health care provider will feel for trigger points. A marker may be used to circle the area for the injection.  The skin over the trigger point will be washed with a germ-killing (antiseptic) solution.  A thin needle is used for the shot. You may feel pain or a twitching feeling when the needle enters the trigger point.  A numbing solution may be injected into the trigger point. Sometimes a medicine to keep down swelling, redness, and warmth (  inflammation) is also injected.  Your health care provider may move the needle around the area where the trigger point is located until the tightness and twitching goes away.  After the injection, your health care provider may put gentle pressure over the injection site.  The injection site will be covered with a bandage (dressing). The procedure may vary  among health care providers and hospitals. What happens after the procedure?  The dressing can be taken off in a few hours or as told by your health care provider.  You may feel sore and stiff for 1-2 days. This information is not intended to replace advice given to you by your health care provider. Make sure you discuss any questions you have with your health care provider. Document Released: 12/02/2011 Document Revised: 08/15/2016 Document Reviewed: 06/02/2015 Elsevier Interactive Patient Education  2018 [employer].

## 2018-01-18 NOTE — Progress Notes (Signed)
Safety precautions to be maintained throughout the outpatient stay will include: orient to surroundings, keep bed in low position, maintain call bell within reach at all times, provide assistance with transfer out of bed and ambulation.  

## 2018-01-24 ENCOUNTER — Other Ambulatory Visit: Payer: Self-pay | Admitting: *Deleted

## 2018-01-24 ENCOUNTER — Ambulatory Visit
Admission: RE | Admit: 2018-01-24 | Discharge: 2018-01-24 | Disposition: A | Discharge status: Home / Self Care | Payer: Medicare Other | Source: Ambulatory Visit | Attending: Gastroenterology | Admitting: Gastroenterology

## 2018-01-24 ENCOUNTER — Other Ambulatory Visit: Payer: Self-pay | Admitting: Gastroenterology

## 2018-01-24 DIAGNOSIS — K5900 Constipation, unspecified: Secondary | ICD-10-CM

## 2018-01-24 DIAGNOSIS — D649 Anemia, unspecified: Secondary | ICD-10-CM

## 2018-01-25 ENCOUNTER — Inpatient Hospital Stay: Payer: Medicare Other

## 2018-01-25 ENCOUNTER — Inpatient Hospital Stay (HOSPITAL_BASED_OUTPATIENT_CLINIC_OR_DEPARTMENT_OTHER): Payer: Medicare Other | Admitting: Oncology

## 2018-01-25 ENCOUNTER — Inpatient Hospital Stay: Payer: Medicare Other | Attending: Oncology

## 2018-01-25 ENCOUNTER — Encounter: Payer: Self-pay | Admitting: Oncology

## 2018-01-25 VITALS — BP 123/70 | HR 68 | Temp 98.3°F | Resp 18 | Wt 295.2 lb

## 2018-01-25 DIAGNOSIS — M5489 Other dorsalgia: Secondary | ICD-10-CM | POA: Insufficient documentation

## 2018-01-25 DIAGNOSIS — I10 Essential (primary) hypertension: Secondary | ICD-10-CM | POA: Diagnosis not present

## 2018-01-25 DIAGNOSIS — D509 Iron deficiency anemia, unspecified: Secondary | ICD-10-CM | POA: Diagnosis not present

## 2018-01-25 DIAGNOSIS — D649 Anemia, unspecified: Secondary | ICD-10-CM

## 2018-01-25 LAB — CBC WITH DIFFERENTIAL/PLATELET
BASOS ABS: 0.1 10*3/uL (ref 0–0.1)
BASOS PCT: 1 %
EOS ABS: 0.4 10*3/uL (ref 0–0.7)
Eosinophils Relative: 4 %
HCT: 36.9 % (ref 35.0–47.0)
HEMOGLOBIN: 12.1 g/dL (ref 12.0–16.0)
Lymphocytes Relative: 27 %
Lymphs Abs: 2.4 10*3/uL (ref 1.0–3.6)
MCH: 28.6 pg (ref 26.0–34.0)
MCHC: 32.7 g/dL (ref 32.0–36.0)
MCV: 87.5 fL (ref 80.0–100.0)
MONOS PCT: 6 %
Monocytes Absolute: 0.5 10*3/uL (ref 0.2–0.9)
Neutro Abs: 5.5 10*3/uL (ref 1.4–6.5)
Neutrophils Relative %: 62 %
Platelets: 197 10*3/uL (ref 150–440)
RBC: 4.22 MIL/uL (ref 3.80–5.20)
RDW: 14 % (ref 11.5–14.5)
WBC: 8.9 10*3/uL (ref 3.6–11.0)

## 2018-01-25 LAB — IRON AND TIBC
Iron: 42 ug/dL (ref 28–170)
SATURATION RATIOS: 16 % (ref 10.4–31.8)
TIBC: 267 ug/dL (ref 250–450)
UIBC: 225 ug/dL

## 2018-01-25 LAB — FERRITIN: FERRITIN: 48 ng/mL (ref 11–307)

## 2018-01-25 NOTE — Progress Notes (Signed)
Patient reports weakness, fatigue, headaches and muscle aches.

## 2018-01-25 NOTE — Progress Notes (Signed)
[place]  [phone]) [phone] Fax:(336) [phone]  ID: Brittney [name] OB: [birth year]  MR#: [id number]  IWP#:[id number]  Patient Care Team: Rusty Aus, MD as PCP - General (Internal Medicine)  CHIEF COMPLAINT: Iron deficiency anemia.  INTERVAL HISTORY: Patient returns to clinic today as an add-on several weeks before her scheduled appointment secondary to worsening weakness and fatigue.  She otherwise feels well.  She denies any ice cravings.  She has no neurologic complaints. She denies any recent fevers or illnesses. She has a good appetite and denies weight loss. She denies any chest pain or shortness of breath. She has no nausea, vomiting, constipation, or diarrhea. She denies any melena or hematochezia. She has no urinary complaints. Patient offers no further specific complaints.  REVIEW OF SYSTEMS:   Review of Systems  Constitutional: Positive for malaise/fatigue. Negative for fever and weight loss.  Respiratory: Negative.  Negative for cough, hemoptysis and shortness of breath.   Cardiovascular: Negative.  Negative for chest pain and leg swelling.  Gastrointestinal: Negative.  Negative for abdominal pain, blood in stool and melena.  Genitourinary: Negative.   Musculoskeletal: Positive for back pain.  Skin: Negative.  Negative for rash.  Neurological: Positive for weakness.  Psychiatric/Behavioral: Negative.  The patient is not nervous/anxious.     As per HPI. Otherwise, a complete review of systems is negative.  PAST MEDICAL HISTORY: Past Medical History:  Diagnosis Date  . Abdominal pain, left upper quadrant 12/11/2012  . Arthritis   . Asthma   . Broken leg 2014   fell twice and broke same leg. had a bone stimulator for first break.  . Cancer ([place]) 2007   melanoma, left ear and back of left leg, removed from back  . Chronic lower back pain   . Collagen vascular disease ([place])   . Coronary artery disease   . Diabetes mellitus without  complication ([place])   . Diabetic nephropathy associated with secondary diabetes mellitus ([place])   . [place] 12/11/2012  . Gastroparesis   . Gross hematuria 12/11/2012  . Hepatic cirrhosis ([place])   . Hyperlipidemia   . Hypertension   . IBS (irritable bowel syndrome)   . Kidney stone 12/11/2012  . Lower extremity edema   . Morbid obesity with BMI of 40.0-44.9, adult ([place])   . Myocardial infarction ([place]) 09/2013  . Nausea without vomiting 12/11/2012  . Nephrolithiasis   . Nephrolithiasis 04/26/2014  . Nonproliferative retinopathy due to secondary diabetes ([place])   . Numbness and tingling of right leg   . Peripheral vascular disease ([place])   . Renal colic 50/53/9767  . Sciatica   . Sciatica 12/11/2012  . Skin cancer   . Sleep apnea    sleep study coming in november  . Unilateral small kidney without contralateral hypertrophy     PAST SURGICAL HISTORY: Past Surgical History:  Procedure Laterality Date  . ABDOMINAL HYSTERECTOMY    . BACK SURGERY  04/1999   L-4-5 LAMINECTOMY. metal in lower back and neck  . CESAREAN SECTION  1980  . CHOLECYSTECTOMY    . COLONOSCOPY  05/04/2001  . COLONOSCOPY WITH PROPOFOL N/A 09/20/2016   Procedure: COLONOSCOPY WITH PROPOFOL;  Surgeon: Lollie Sails, MD;  Location: [facility] ENDOSCOPY;  Service: Endoscopy;  Laterality: N/A;  . DILATION AND CURETTAGE OF UTERUS    . ESOPHAGOGASTRODUODENOSCOPY  02/08/2014  . ESOPHAGOGASTRODUODENOSCOPY (EGD) WITH PROPOFOL N/A 09/20/2016   Procedure: ESOPHAGOGASTRODUODENOSCOPY (EGD) WITH PROPOFOL;  Surgeon: Lollie Sails, MD;  Location: [facility] ENDOSCOPY;  Service:  Endoscopy;  Laterality: N/A;  . EYE SURGERY  1986   FOR MELANOMA  . HERNIA REPAIR    . JOINT REPLACEMENT Left 09/24/2013   TOTAL KNEE  . MOHS SURGERY  11/2016   left side of nose  . PULSE GENERATOR IMPLANT N/A 11/02/2017   Procedure: UNILATERAL PULSE GENERATOR IMPLANT;  Surgeon: Meade Maw, MD;  Location: [facility] ORS;  Service: Neurosurgery;  Laterality:  N/A;  . TONSILLECTOMY     WITH ADENOIDECTOMY    FAMILY HISTORY: Family History  Problem Relation Age of Onset  . Heart disease Mother   . Cancer Mother   . Asthma Mother   . Diabetes Father   . Kidney disease Father   . Hypertension Father   . Breast cancer Neg Hx     ADVANCED DIRECTIVES (Y/N):  N  HEALTH MAINTENANCE: Social History   Tobacco Use  . Smoking status: Never Smoker  . Smokeless tobacco: Never Used  Substance Use Topics  . Alcohol use: No  . Drug use: No     Colonoscopy:  PAP:  Bone density:  Lipid panel:  Allergies  Allergen Reactions  . Eggs Or Egg-Derived Products Nausea And Vomiting    Only has reaction to flu vaccine due to eggs, no reaction to egg food products  . Shellfish Allergy Nausea And Vomiting    Non stop sickness once ingests shellfish. Betadine is okay  . Amlodipine Cough  . Imdur [Isosorbide Dinitrate] Other (See Comments)    Headache  . Lyrica [Pregabalin] Swelling and Other (See Comments)    "FLU-LIKE" SYMPTOMS  . Monosodium Glutamate Other (See Comments)    Headache  . Tape Other (See Comments)    can use paper tape    Current Outpatient Medications  Medication Sig Dispense Refill  . albuterol (VENTOLIN HFA) 108 (90 Base) MCG/ACT inhaler TAKE 2 PUFFS BY MOUTH 3 TIMES A DAY AS NEEDED FOR SHORTNESS OF BREATH    . ALPRAZolam (XANAX) 0.5 MG tablet Take 0.5 mg by mouth 4 (four) times daily.     . bumetanide (BUMEX) 1 MG tablet Take by mouth daily.     . butalbital-acetaminophen-caffeine (FIORICET, ESGIC) 50-325-40 MG tablet TAKE ONE TABLET BY MOUTH ONCE DAILY    . candesartan (ATACAND) 32 MG tablet Take 1 tablet (32 mg total) by mouth daily. 30 tablet 11  . carvedilol (COREG) 25 MG tablet Take 37.5-50 mg by mouth 2 (two) times daily with a meal. TAKES 2 TABLETS IN THE MORNING AND 1.5 TABLETS IN THE EVENING    . cloNIDine (CATAPRES - DOSED IN MG/24 HR) 0.3 mg/24hr patch Place 0.3 mg onto the skin once a week.     . diclofenac  sodium (VOLTAREN) 1 % GEL Apply 4 g topically as needed (PAIN).     Marland Kitchen doxazosin (CARDURA) 4 MG tablet Take 4 mg by mouth at bedtime.     Marland Kitchen escitalopram (LEXAPRO) 10 MG tablet Take 10 mg by mouth daily.     . fluticasone (FLONASE) 50 MCG/ACT nasal spray PLACE 2 SPRAYS INTO BOTH NOSTRILS 2 (TWO) TIMES DAILY AS NEEDED FOR ALLERGIES    . hydrALAZINE (APRESOLINE) 25 MG tablet Take 25 mg by mouth at bedtime.     . Insulin Detemir (LEVEMIR FLEXTOUCH) 100 UNIT/ML Pen INJECT 90 UNITS SUBCUTANEOUSLY TWICE A DAY    . Insulin Pen Needle (FIFTY50 PEN NEEDLES) 32G X 6 MM MISC 5 (five) times daily.    . insulin regular human CONCENTRATED (HUMULIN R) 500 UNIT/ML kwikpen Inject  70-75 Units into the skin 3 (three) times daily with meals. PER SLIDING SCALE    . lidocaine (LIDODERM) 5 % PLACE 1 PATCH ONTO THE MOST PAINFUL AREA OF SKIN DAILY FOR UP TO 12 HOURS IN A 24 HOUR PERIOD  5  . lubiprostone (AMITIZA) 24 MCG capsule Take 48 mcg by mouth daily.    . metoCLOPramide (REGLAN) 10 MG tablet Take 10 mg by mouth 3 (three) times daily.    . pantoprazole (PROTONIX) 40 MG tablet TAKE 1 TABLET (40 MG TOTAL) BY MOUTH 2 (TWO) TIMES DAILY. TAKE ONE HOUR BEFORE A MEAL    . potassium chloride (K-DUR,KLOR-CON) 10 MEQ tablet TAKE 1 TABLET BY MOUTH ONCE A DAY    . pramipexole (MIRAPEX) 0.5 MG tablet TAKE 2 TABLETS BY MOUTH AT BEDTIME    . tiZANidine (ZANAFLEX) 4 MG tablet Take 1 tablet (4 mg total) by mouth 2 (two) times daily as needed for muscle spasms. 60 tablet 1  . traZODone (DESYREL) 150 MG tablet Take 150 mg by mouth at bedtime.     Marland Kitchen zolpidem (AMBIEN) 10 MG tablet Take 10 mg by mouth at bedtime.     . celecoxib (CELEBREX) 200 MG capsule Take 1 capsule (200 mg total) 2 (two) times daily by mouth. (Patient not taking: Reported on 01/25/2018) 8 capsule 0  . doxycycline (VIBRA-TABS) 100 MG tablet Take 1 tablet (100 mg total) by mouth 2 (two) times daily. (Patient not taking: Reported on 01/25/2018) 14 tablet 0  . gabapentin  (NEURONTIN) 600 MG tablet Take 1 tablet (600 mg total) by mouth 6 (six) times daily. (Patient taking differently: Take 600 mg by mouth See admin instructions. ) 540 tablet 0  . ketoconazole (NIZORAL) 2 % shampoo APPLY TO AFFECTED AREAS (LEAVE ON FOR 5 MINUTES) , RINSE WELL, USE 2-3 TIMES PER WEEK.  11  . methocarbamol (ROBAXIN) 750 MG tablet Take 1 tablet (750 mg total) every 6 (six) hours as needed by mouth for muscle spasms. (Patient not taking: Reported on 01/25/2018) 120 tablet 0  . traMADol (ULTRAM) 50 MG tablet Take 2 tablets (100 mg total) by mouth every 8 (eight) hours. 180 tablet 2   No current facility-administered medications for this visit.     OBJECTIVE: Vitals:   01/25/18 1500  BP: 123/70  Pulse: 68  Resp: 18  Temp: 98.3 F (36.8 C)  SpO2: 96%     Body mass index is 47.65 kg/m.    ECOG FS:0 - Asymptomatic  General: Well-developed, well-nourished, no acute distress. Eyes: Pink conjunctiva, anicteric sclera. Lungs: Clear to auscultation bilaterally. Heart: Regular rate and rhythm. No rubs, murmurs, or gallops. Abdomen: Soft, nontender, nondistended. No organomegaly noted, normoactive bowel sounds. Musculoskeletal: No edema, cyanosis, or clubbing. Neuro: Alert, answering all questions appropriately. Cranial nerves grossly intact. Skin: No rashes or petechiae noted. Psych: Normal affect.   LAB RESULTS:  Lab Results  Component Value Date   NA 137 10/28/2017   K 3.8 10/28/2017   CL 101 10/28/2017   CO2 28 10/28/2017   GLUCOSE 204 (H) 10/28/2017   BUN 14 10/28/2017   CREATININE 0.60 10/28/2017   CALCIUM 9.5 10/28/2017   PROT 7.0 10/28/2017   ALBUMIN 3.4 (L) 10/28/2017   AST 29 10/28/2017   ALT 24 10/28/2017   ALKPHOS 103 10/28/2017   BILITOT 0.6 10/28/2017   GFRNONAA >60 10/28/2017   GFRAA >60 10/28/2017    Lab Results  Component Value Date   WBC 8.9 01/25/2018   NEUTROABS 5.5 01/25/2018  HGB 12.1 01/25/2018   HCT 36.9 01/25/2018   MCV 87.5 01/25/2018    PLT 197 01/25/2018   Lab Results  Component Value Date   IRON 42 01/25/2018   TIBC 267 01/25/2018   IRONPCTSAT 16 01/25/2018   Lab Results  Component Value Date   FERRITIN 48 01/25/2018     STUDIES: Dg Abd 1 View  Result Date: 01/24/2018 CLINICAL DATA:  Constipation. EXAM: ABDOMEN - 1 VIEW COMPARISON:  01/18/2018.  11/02/2017.  CT 05/25/2017. FINDINGS: Neurostimulator noted in stable position. Surgical clips right upper quadrant. Large amount of stool noted throughout the colon. This would be consistent with constipation. No bowel distention or free air. Prior lower lumbar spine fusion and laminectomy. IMPRESSION: Large amount of stool noted throughout the colon. This would be consistent with constipation. No acute abnormality identified. Electronically Signed   By: Marcello Moores  Register   On: 01/24/2018 16:28   Dg C-arm 1-60 Min-no Report  Result Date: 01/18/2018 Fluoroscopy was utilized by the requesting physician.  No radiographic interpretation.    ASSESSMENT: Iron deficiency anemia.  PLAN:    1. Iron deficiency anemia: Patient's hemoglobin and iron stores continue to be within normal limits. Previously, the remainder of her laboratory work was also either negative or within normal limits. She does not require additional Feraheme today. Patient last received treatment on July 13, 2017. Colonoscopy and EGD completed on September 20, 2016 were essentially normal other than some mild gastritis. Return to clinic in 4 months for repeat laboratory work and further evaluation.  2. Hypertension: Patient's blood pressure is within normal limits today. Continue evaluation and treatment per primary care. 3. Back pain: Patient has a permanent spinal stimulator in place.  Approximately 20 minutes was spent in discussion of which greater than 50% was consultation.  Patient expressed understanding and was in agreement with this plan. She also understands that She can call clinic at any time with  any questions, concerns, or complaints.    Lloyd Huger, MD   01/25/2018 5:32 PM

## 2018-01-25 NOTE — Progress Notes (Unsigned)
Per [place],  no treatment today

## 2018-02-01 ENCOUNTER — Other Ambulatory Visit: Payer: Self-pay

## 2018-02-01 ENCOUNTER — Encounter: Payer: Self-pay | Admitting: Student in an Organized Health Care Education/Training Program

## 2018-02-01 ENCOUNTER — Ambulatory Visit
Payer: Medicare Other | Attending: Student in an Organized Health Care Education/Training Program | Admitting: Student in an Organized Health Care Education/Training Program

## 2018-02-01 ENCOUNTER — Telehealth: Payer: Self-pay | Admitting: Pulmonary Disease

## 2018-02-01 VITALS — BP 120/46 | HR 59 | Temp 98.2°F | Resp 16 | Ht 66.0 in | Wt 289.0 lb

## 2018-02-01 DIAGNOSIS — Z9889 Other specified postprocedural states: Secondary | ICD-10-CM

## 2018-02-01 DIAGNOSIS — Z888 Allergy status to other drugs, medicaments and biological substances status: Secondary | ICD-10-CM | POA: Diagnosis not present

## 2018-02-01 DIAGNOSIS — M542 Cervicalgia: Secondary | ICD-10-CM | POA: Insufficient documentation

## 2018-02-01 DIAGNOSIS — M25511 Pain in right shoulder: Secondary | ICD-10-CM | POA: Insufficient documentation

## 2018-02-01 DIAGNOSIS — M7918 Myalgia, other site: Secondary | ICD-10-CM

## 2018-02-01 DIAGNOSIS — Z9049 Acquired absence of other specified parts of digestive tract: Secondary | ICD-10-CM | POA: Diagnosis not present

## 2018-02-01 DIAGNOSIS — Z91012 Allergy to eggs: Secondary | ICD-10-CM | POA: Insufficient documentation

## 2018-02-01 DIAGNOSIS — Z794 Long term (current) use of insulin: Secondary | ICD-10-CM | POA: Diagnosis not present

## 2018-02-01 DIAGNOSIS — Z91013 Allergy to seafood: Secondary | ICD-10-CM | POA: Diagnosis not present

## 2018-02-01 DIAGNOSIS — M79604 Pain in right leg: Secondary | ICD-10-CM | POA: Insufficient documentation

## 2018-02-01 DIAGNOSIS — M961 Postlaminectomy syndrome, not elsewhere classified: Secondary | ICD-10-CM | POA: Diagnosis not present

## 2018-02-01 DIAGNOSIS — M79605 Pain in left leg: Secondary | ICD-10-CM

## 2018-02-01 DIAGNOSIS — M25512 Pain in left shoulder: Secondary | ICD-10-CM | POA: Insufficient documentation

## 2018-02-01 DIAGNOSIS — G8929 Other chronic pain: Secondary | ICD-10-CM

## 2018-02-01 MED ORDER — ROPIVACAINE HCL 2 MG/ML IJ SOLN
10.0000 mL | Freq: Once | INTRAMUSCULAR | Status: AC
  Administered 2018-02-01: 10 mL
  Filled 2018-02-01: qty 10

## 2018-02-01 NOTE — Progress Notes (Signed)
Patient's Name: Brittney [name]  MRN: [id number]  Referring Provider: Rusty Aus, MD  DOB: 1951-11-05  PCP: Rusty Aus, MD  DOS: 02/01/2018  Note by: [name], MD  Service setting: Ambulatory outpatient  Specialty: Interventional Pain Management  Patient type: Established  Location: [facility] (AMB) Pain Management Facility  Visit type: Interventional Procedure   Primary Reason for Visit: Interventional Pain Management Treatment. CC: Neck Pain; Shoulder Pain (left); and Leg Pain (right)  Procedure:  Anesthesia, Analgesia, Anxiolysis:  Type: Trigger Point Injection (1-2 muscle groups) CPT: 20552 Primary Purpose: Diagnostic Region: Left trapezius, cervical region Level: Cervico-thoracic Target Area: Trigger Point Approach: Ipsilateral approach. Laterality: Left-Sided   Position: Sitting  Type: Local Anesthesia Local Anesthetic: Lidocaine 1% Route: Infiltration ([place]/IM) IV Access: Declined Sedation: Declined  Indication(s): Analgesia           Indications: 1. Myofascial pain   2. Failed back surgical syndrome   3. 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 Chronic lower extremity pain (Location of Secondary source of pain) (Bilateral) (R>L)    Pain Score: Pre-procedure: 6 /10 Post-procedure: 6 /10  Pre-op Assessment:  Brittney [name] is a 67 y.o. (year old), female patient, seen today for interventional treatment. She  has a past surgical history that includes Cesarean section (1980); Dilation and curettage of uterus; Eye surgery (1986); Abdominal hysterectomy; Tonsillectomy; Esophagogastroduodenoscopy (02/08/2014); Colonoscopy (05/04/2001); Esophagogastroduodenoscopy (egd) with propofol (N/A, 09/20/2016); Colonoscopy with propofol (N/A, 09/20/2016); Mohs surgery (11/2016); Cholecystectomy; Hernia repair; Joint replacement (Left, 09/24/2013); Back surgery (04/1999); and Pulse generator implant (N/A, 11/02/2017). Brittney [name] has a current medication list which  includes the following prescription(s): albuterol, alprazolam, bumetanide, butalbital-acetaminophen-caffeine, candesartan, carvedilol, clonidine, diclofenac sodium, doxazosin, escitalopram, fluticasone, hydralazine, insulin detemir, insulin pen needle, insulin regular human concentrated, ketoconazole, lidocaine, lubiprostone, metoclopramide, pantoprazole, potassium chloride, pramipexole, tizanidine, trazodone, zolpidem, celecoxib, doxycycline, gabapentin, methocarbamol, and tramadol. Her primarily concern today is the Neck Pain; Shoulder Pain (left); and Leg Pain (right)  Initial Vital Signs:  Pulse Rate: (!) 59 Temp: 98.2 F (36.8 C) Resp: 16 BP: (!) 120/46 SpO2: 95 %  BMI: Estimated body mass index is 46.65 kg/m as calculated from the following:   Height as of this encounter: 5\' 6"  (1.676 m).   Weight as of this encounter: 289 lb (131.1 kg).  Risk Assessment: Allergies: Reviewed. She is allergic to eggs or egg-derived products; shellfish allergy; amlodipine; imdur [isosorbide dinitrate]; lyrica [pregabalin]; monosodium glutamate; and tape.  Allergy Precautions: None required Coagulopathies: Reviewed. None identified.  Blood-thinner therapy: None at this time Active Infection(s): Reviewed. None identified. Brittney [name] is afebrile  Site Confirmation: Brittney [name] was asked to confirm the procedure and laterality before marking the site Procedure checklist: Completed Consent: Before the procedure and under the influence of no sedative(s), amnesic(s), or anxiolytics, the patient was informed of the treatment options, risks and possible complications. To fulfill our ethical and legal obligations, as recommended by the American Medical Association's Code of Ethics, I have informed the patient of my clinical impression; the nature and purpose of the treatment or procedure; the risks, benefits, and possible complications of the intervention; the alternatives, including doing nothing; the risk(s) and  benefit(s) of the alternative treatment(s) or procedure(s); and the risk(s) and benefit(s) of doing nothing. The patient was provided information about the general risks and possible complications associated with the procedure. These may include, but are not limited to: failure to achieve desired goals, infection, bleeding, organ or nerve damage, allergic reactions, paralysis,  and death. In addition, the patient was informed of those risks and complications associated to the procedure, such as failure to decrease pain; infection; bleeding; organ or nerve damage with subsequent damage to sensory, motor, and/or autonomic systems, resulting in permanent pain, numbness, and/or weakness of one or several areas of the body; allergic reactions; (i.e.: anaphylactic reaction); and/or death. Furthermore, the patient was informed of those risks and complications associated with the medications. These include, but are not limited to: allergic reactions (i.e.: anaphylactic or anaphylactoid reaction(s)); adrenal axis suppression; blood sugar elevation that in diabetics may result in ketoacidosis or comma; water retention that in patients with history of congestive heart failure may result in shortness of breath, pulmonary edema, and decompensation with resultant heart failure; weight gain; swelling or edema; medication-induced neural toxicity; particulate matter embolism and blood vessel occlusion with resultant organ, and/or nervous system infarction; and/or aseptic necrosis of one or more joints. Finally, the patient was informed that Medicine is not an exact science; therefore, there is also the possibility of unforeseen or unpredictable risks and/or possible complications that may result in a catastrophic outcome. The patient indicated having understood very clearly. We have given the patient no guarantees and we have made no promises. Enough time was given to the patient to ask questions, all of which were answered to  the patient's satisfaction. Brittney [name] has indicated that she wanted to continue with the procedure. Attestation: I, the ordering provider, attest that I have discussed with the patient the benefits, risks, side-effects, alternatives, likelihood of achieving goals, and potential problems during recovery for the procedure that I have provided informed consent. Date: 02/01/2018  Time: N/A  Pre-Procedure Preparation:  Monitoring: As per clinic protocol. Respiration, ETCO2, SpO2, BP, heart rate and rhythm monitor placed and checked for adequate function Safety Precautions: Patient was assessed for positional comfort and pressure points before starting the procedure. Time-out: I initiated and conducted the "Time-out" before starting the procedure, as per protocol. The patient was asked to participate by confirming the accuracy of the "Time Out" information. Verification of the correct person, site, and procedure were performed and confirmed by me, the nursing staff, and the patient. "Time-out" conducted as per Joint Commission's Universal Protocol (UP.01.01.01). "Time-out" Date & Time: 02/01/2018; 0951 hrs.  Description of Procedure Process:   Area Prepped: Entire Lateral Cervicothoracic Region Prepping solution: ChloraPrep (2% chlorhexidine gluconate and 70% isopropyl alcohol) Safety Precautions: Aspiration looking for blood return was conducted prior to all injections. At no point did we inject any substances, as a needle was being advanced. No attempts were made at seeking any paresthesias. Safe injection practices and needle disposal techniques used. Medications properly checked for expiration dates. SDV (single dose vial) medications used. Description of the Procedure: Protocol guidelines were followed. The patient was placed in position over the fluoroscopy table. The target area was identified and the area prepped in the usual manner. Skin desensitized using vapocoolant spray. Skin & deeper tissues  infiltrated with local anesthetic. Appropriate amount of time allowed to pass for local anesthetics to take effect. The procedure needles were then advanced to the target area. Proper needle placement secured. Negative aspiration confirmed. Solution injected in intermittent fashion, asking for systemic symptoms every 0.5cc of injectate. The needles were then removed and the area cleansed, making sure to leave some of the prepping solution back to take advantage of its long term bactericidal properties. Vitals:   02/01/18 0924  BP: (!) 120/46  Pulse: (!) 59  Resp: 16  Temp:  98.2 F (36.8 C)  TempSrc: Oral  SpO2: 95%  Weight: 289 lb (131.1 kg)  Height: 5\' 6"  (1.676 m)    Start Time: 0951 hrs. End Time: 1003 hrs. Materials:  Needle(s) Type: Regular needle Gauge: 25G Length: 1.5-in Medication(s): Please see orders for medications and dosing details. 10 trigger points injected with 1 cc of 0.2% ropivacaine at each trigger point. Imaging Guidance:  Type of Imaging Technique: None used Indication(s): N/A Exposure Time: No patient exposure Contrast: None used. Fluoroscopic Guidance: N/A Ultrasound Guidance: N/A Interpretation: N/A  Antibiotic Prophylaxis:   Antibiotics Given (last 72 hours)    None     Indication(s): None identified  Post-operative Assessment:  Post-procedure Vital Signs:  Pulse Rate: (!) 59 Temp: 98.2 F (36.8 C) Resp: 16 BP: (!) 120/46 SpO2: 95 %  EBL: None  Complications: No immediate post-treatment complications observed by team, or reported by patient.  Note: The patient tolerated the entire procedure well. A repeat set of vitals were taken after the procedure and the patient was kept under observation following institutional policy, for this type of procedure. Post-procedural neurological assessment was performed, showing return to baseline, prior to discharge. The patient was provided with post-procedure discharge instructions, including a section on  how to identify potential problems. Should any problems arise concerning this procedure, the patient was given instructions to immediately contact us, at any time, without hesitation. In any case, we plan to contact the patient by telephone for a follow-up status report regarding this interventional procedure.  Comments:  No additional relevant information.  Plan of Care   Imaging Orders  No imaging studies ordered today   Procedure Orders    No procedure(s) ordered today    Medications ordered for procedure: Meds ordered this encounter  Medications  . ropivacaine (PF) 2 mg/mL (0.2%) (NAROPIN) injection 10 mL   Medications administered: We administered ropivacaine (PF) 2 mg/mL (0.2%).  See the medical record for exact dosing, route, and time of administration.  New Prescriptions   No medications on file   Disposition: Discharge home  Discharge Date & Time: 02/01/2018; 1021 hrs.   Physician-requested Follow-up: Return in about 8 weeks (around 03/29/2018) for Post Procedure Evaluation.  Future Appointments  Date Time Provider [place]  03/30/2018  9:30 AM [name], MD [facility] None  05/24/2018  1:45 PM [facility] None  05/24/2018  2:15 PM Grayland Ormond, Kathlene November, MD [facility] None  05/24/2018  2:30 PM [facility] INFUSION CHAIR 1 [facility] None   Primary Care Physician: Rusty Aus, MD Location: [facility] Note by: [name], MD Date: 02/01/2018; Time: 11:39 AM  Disclaimer:  Medicine is not an exact science. The only guarantee in medicine is that nothing is guaranteed. It is important to note that the decision to proceed with this intervention was based on the information collected from the patient. The Data and conclusions were drawn from the patient's questionnaire, the interview, and the physical examination. Because the information was provided in large part by the patient, it cannot be guaranteed that it has not been  purposely or unconsciously manipulated. Every effort has been made to obtain as much relevant data as possible for this evaluation. It is important to note that the conclusions that lead to this procedure are derived in large part from the available data. Always take into account that the treatment will also be dependent on availability of resources and existing treatment guidelines, considered by other Pain Management Practitioners as being  common knowledge and practice, at the time of the intervention. For Medico-Legal purposes, it is also important to point out that variation in procedural techniques and pharmacological choices are the acceptable norm. The indications, contraindications, technique, and results of the above procedure should only be interpreted and judged by a Board-Certified Interventional Pain Specialist with extensive familiarity and expertise in the same exact procedure and technique.

## 2018-02-01 NOTE — Patient Instructions (Signed)
____________________________________________________________________________________________ ° °Genicular Nerve Block ° °What is a genicular nerve block? °A genicular nerve block is the injection of a local anesthetic to block the nerves that transmits pain from the knee. ° °What is the purpose of a facet nerve block? °A genicular nerve block is a diagnostic procedure to determine if the pathologic changes (i.e. arthritis, meniscal tears, etc) and inflammation within the knee joint is the source of your knee pain. It also confirms that the knee pain will respond well to the actual treatment procedure. If a genicular nerve block works, it will give you relief for several hours. After that, the pain is expected to return to normal. This test is always performed twice (usually a week or two apart) because two successful tests are required to move onto treatment. If both diagnostic tests are positive, then we schedule a treatment called radiofrequency (RF) ablation. In this procedure, the same nerves are cauterized, which typically leads to pain relief for 4 -18 months. If this process works well for one knee, it can be performed on the other knee if needed. ° °How is the procedure performed? °You will be placed on the procedure table. The injection site is sterilized with either iodine or chlorhexadine. The site to be injected is numbed with a local anesthetic, and a needle is directed to the target area. X-ray guidance is used to ensure proper placement and positioning of the needle. When the needle is properly positioned near the genicular nerve, local anesthetic is injected to numb that nerve. This will be repeated at multiple sites around the knee to block all genicular nerves. ° °Will the procedure be painful? °The injection can be painful and we therefore provide the option of receiving IV sedation. IV sedation, combined with local anesthetic, can make the injection nearly pain free. It allows you to remain very  still during the procedure, which can also make the injection easier, faster, and more successful. If you decide to have IV sedation, you must have a driver to get you home safely afterwards. In addition, you cannot have anything to eat or drink within 8 hours of your appointment (clear liquids are allowed until 3 hours before the procedure). If you take medications for diabetes, these medications may need to be adjusted the morning of the procedure. Your primary care physician can help you with this adjustment. ° °What are the discharge instructions? °If you received IV sedation do not drive or operate machinery for at least 24 hours after the procedure. You may return to work the next day following your procedure. You may resume your normal diet immediately. Do not engage in any strenuous activity for 24 hours. You should, however, engage in moderate activity that typically causes your ususal pain. If the block works, those activities should not be painful for several hours after the injection. Do not take a bath, swim, or use a hot tub for 24 hours (you may take a shower). Call the office if you have any of the following: severe pain afterwards (different than your usual symptoms), redness/swelling/discharge at the injection site(s), fevers/chills, difficulty with bowel or bladder functions. ° °What are the risks and side effects? °The complication rate for this procedure is very low. Whenever a needle enters the skin, bleeding or infection can occur. Some other serious but extremely rare risks include paralysis and death. °You may have an allergic reaction to any of the medications used. If you have a known allergy to any medications, especially local anesthetics, notify   our staff before the procedure takes place. °You may experience any of the following side effects up to 4 - 6 hours after the procedure: °• Leg muscle weakness or numbness may occur due to the local anesthetic affecting the nerves that control  your legs (this is a temporary affect and it is not paralysis). If you have any leg weakness or numbness, walk only with assistance in order to prevent falls and injury. Your leg strength will return slowly and completely. °• Dizziness may occur due to a decrease in your blood pressure. If this occurs, remain in a seated or lying position. Gradually sit up, and then stand after at least 10 minutes of sitting. °• Mild headaches may occur. Drink fluids and take pain medications if needed. If the headaches persist or become severe, call the office. °• Mild discomfort at the injection site can occur. This typically lasts for a few hours but can persist for a couple days. If this occurs, take anti-inflammatories or pain medications, apply ice to the area the day of the procedure. If it persists, apply moist heat in the day(s) following. ° °The side effects listed above can be normal. They are not dangerous and will resolve on their own. If, however, you experience any of the following, a complication may have occurred and you should either contact your doctor. If he is not readily available, then you should proceed to the closest urgent care center for evaluation: °• Severe or progressive pain at the injection site(s) °• Arm or leg weakness that progressively worsens or persists for longer than 8 hours °• Severe or progressive redness, swelling, or discharge from the injections site(s) °• Fevers, chills, nausea, or vomiting °• Bowel or bladder dysfunction (i.e. inability to urinate or pass stool or difficulty controlling either) ° °How long does it take for the procedure to work? °You should feel relief from your usual pain within the first hour. Again, this is only expected to last for several hours, at the most. Remember, you may be sore in the middle part of your back from the needles, and you must distinguish this from your usual  pain. °____________________________________________________________________________________________ ° ° °

## 2018-02-01 NOTE — Telephone Encounter (Signed)
Regarding CPAP part of this message, pt was set up today with CPAP and [facility] didn't supply any 02.  I contacted Darlina Guys with [facility] to inquire why 02 wasn't provided b/c the order stated to bleed in 1 lpm of 02.  Per Melissa 02 should have been placed as a separate order as 02 is considered a Rx.  I advised Melissa that when order was processed [facility] should have contacted [place] to advise if a separate order was needed. Melissa stated that [facility] would have assumed that patient already had 02 from another company and would not have questioned this.   It has now been more than 30 days since the sleep study has been done and [facility] states that pt will have to go back into the Sleep Lab to have another study b/c order for 02 and study has to be done within 30 days of each other. If not, pt will not be able to receive 02.  Please advise. Rhonda J Cobb

## 2018-02-01 NOTE — Telephone Encounter (Signed)
Pt calling stating the past two weeks she's had sinus issues and cough  Would like to know if we may call in something for her   Please use [facility] in [place] if we are able to   She states she was to be put on oxygen, she has her CPAP machine but is asking when the oxygen would be delivered to her   Please call back

## 2018-02-01 NOTE — Progress Notes (Signed)
Safety precautions to be maintained throughout the outpatient stay will include: orient to surroundings, keep bed in low position, maintain call bell within reach at all times, provide assistance with transfer out of bed and ambulation.  

## 2018-02-02 ENCOUNTER — Encounter: Payer: Self-pay | Admitting: Internal Medicine

## 2018-02-02 ENCOUNTER — Ambulatory Visit (INDEPENDENT_AMBULATORY_CARE_PROVIDER_SITE_OTHER): Payer: Medicare Other | Admitting: Internal Medicine

## 2018-02-02 VITALS — BP 138/84 | HR 73 | Resp 16 | Ht 66.0 in | Wt 294.0 lb

## 2018-02-02 DIAGNOSIS — J411 Mucopurulent chronic bronchitis: Secondary | ICD-10-CM | POA: Diagnosis not present

## 2018-02-02 MED ORDER — FLUTICASONE PROPIONATE HFA 220 MCG/ACT IN AERO: 2.0000 | INHALATION_SPRAY | Freq: Two times a day (BID) | RESPIRATORY_TRACT | 2 refills | Status: DC

## 2018-02-02 MED ORDER — DOXYCYCLINE HYCLATE 100 MG PO TABS: 100.0000 mg | ORAL_TABLET | Freq: Two times a day (BID) | ORAL | Status: AC

## 2018-02-02 NOTE — Telephone Encounter (Signed)
Post procedure phone call.  Left messge 

## 2018-02-02 NOTE — Progress Notes (Signed)
PULMONARY OFFICE FOLLOW-UP NOTE  Requesting MD/Service: Emily Filbert, MD Date of initial consultation: 09/30/17 Reason for consultation: acute cough  PT PROFILE: 67 y.o. female never smoker previously see by Dr Ileene Musa and Dr Raul Del referred for eval of chronic cough of 4 yrs duration  DATA: 05/25/17 CT chest: Normal lung parenchyma. 3 mm posterior segment right upper lobe nodule, stable from 10/15/2014 and therefore benign. 3 mm left lower lobe nodular density, likely mucoid impaction  12/08/17 PFTs: No obstruction, lung volumes low-normal, DLCO 69% predicted, DLCO/VA 136% predicted  SUBJ:  She was initially evaluated for chronic cough.  At that time, we changed the ACE inhibitor to an ARB.  In the past few days  she has had a relapse with cough productive of scant green mucus.   She denies chest pain, fever, hemoptysis.  She has had some increased shortness of breath.  she has signs and symptoms of acute bronchitis  Low grade fevers as well    Vitals:   02/02/18 1448  Height: 5\' 6"  (1.676 m)  RA BP 138/84 (BP Location: Left Arm, Cuff Size: Large)   Pulse 73   Resp 16   Ht 5\' 6"  (1.676 m)   Wt 294 lb (133.4 kg)   SpO2 90%   BMI 47.45 kg/m    Review of Systems:  Gen:  Denies  fever, sweats, chills weigh loss  HEENT: Denies blurred vision, double vision, ear pain, eye pain, hearing loss, nose bleeds, sore throat Cardiac:  No dizziness, chest pain or heaviness, chest tightness,edema Resp:   +cough +sputum porduction, +shortness of breath, Other:  All other systems negative  EXAM:  Gen: Severely obese, NAD HEENT: WNL Neck: JVD not visualized Lungs: breath sounds are slightly coarse without wheezes or other adventitious sounds Cardiovascular: RRR, no murmurs noted Abdomen: Obese, soft, nontender, normal BS Ext: Symmetric pretibial, ankle and pedal edema Neuro: CNs grossly intact, motor and sensory intact Skin: Limited exam, no lesions noted  Ambulating pulse oximetry  shows significant hypoxia with increased WOB and dizziness  IMPRESSION:   67 yo with ongoing productive cough -Chronic cough, highly suggestive of acute on chronic  Bronchitis  She has never smoked.    She is morbidly obese and exertional dyspnea is almost certainly attributable to this She is now hypoxic and needs oxygen 24 hrs per day    PLAN:  Will start inhaled steroids  albuterol as needed Will give doxycycline for 14 days Plan for oxygen therapy 24 hrs per day   Patient/Family are satisfied with Plan of action and management. All questions answered Follow up in 3 months with Dr Alva Garnet   Corrin Parker, M.D.  [facility]  Medical Director [place] Director [place] Department

## 2018-02-02 NOTE — Patient Instructions (Signed)
Start Doxycycline Start Flovent inhaler Will need oxygen 24 hrs per day

## 2018-02-03 ENCOUNTER — Telehealth: Payer: Self-pay

## 2018-02-03 MED ORDER — DOXYCYCLINE HYCLATE 100 MG PO TABS: 100.0000 mg | ORAL_TABLET | Freq: Two times a day (BID) | ORAL | 0 refills | Status: DC

## 2018-02-03 NOTE — Telephone Encounter (Signed)
Pt informed rx has been sent. Nothing further needed.

## 2018-02-03 NOTE — Telephone Encounter (Signed)
Pt states her doxycycline rx was never sent to her pharmacy. Please call.

## 2018-02-08 ENCOUNTER — Other Ambulatory Visit: Payer: Medicare Other

## 2018-02-08 ENCOUNTER — Ambulatory Visit: Payer: Medicare Other

## 2018-02-08 ENCOUNTER — Ambulatory Visit: Payer: Medicare Other | Admitting: Oncology

## 2018-02-21 ENCOUNTER — Telehealth: Payer: Self-pay | Admitting: Pulmonary Disease

## 2018-02-21 ENCOUNTER — Ambulatory Visit (INDEPENDENT_AMBULATORY_CARE_PROVIDER_SITE_OTHER): Payer: Medicare Other | Admitting: Pulmonary Disease

## 2018-02-21 ENCOUNTER — Encounter: Payer: Self-pay | Admitting: Pulmonary Disease

## 2018-02-21 ENCOUNTER — Ambulatory Visit: Payer: Medicare Other | Admitting: Pulmonary Disease

## 2018-02-21 VITALS — BP 150/84 | HR 70 | Ht 66.0 in | Wt 290.0 lb

## 2018-02-21 DIAGNOSIS — J449 Chronic obstructive pulmonary disease, unspecified: Secondary | ICD-10-CM | POA: Diagnosis not present

## 2018-02-21 DIAGNOSIS — G4733 Obstructive sleep apnea (adult) (pediatric): Secondary | ICD-10-CM

## 2018-02-21 DIAGNOSIS — R05 Cough: Secondary | ICD-10-CM

## 2018-02-21 DIAGNOSIS — R053 Chronic cough: Secondary | ICD-10-CM

## 2018-02-21 NOTE — Telephone Encounter (Signed)
Brittney [name] with [facility] calling stating pt is coming today to see them at 11:30 am  He is calling to make sure what needs to be done in order for patient to get her oxygen   He is also needing OV notes with chronic respiratory dx on it.  And it can't be an acute or exacerbationon it Pt will need to also have 6 m walk test to qualify  for brand new oxygen   Please call back

## 2018-02-21 NOTE — Patient Instructions (Addendum)
Continue Flovent inhaler Continue albuterol as needed Continue oxygen therapy with sleep Continue to work with CPAP with sleep.  Let us know and we will discontinue it Continue your excellent efforts at weight loss Cancel follow-up scheduled for next month and follow-up in 3 months

## 2018-02-22 NOTE — Progress Notes (Signed)
PULMONARY OFFICE FOLLOW-UP NOTE  Requesting MD/Service: Brittney Filbert, MD Date of initial consultation: 09/30/17 Reason for consultation: Chronic Brittney [name]  PT PROFILE: 67 y.o. 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 3 mm posterior segment right upper lobe nodule, stable from 10/15/2014 and therefore benign. 3 mm left lower lobe nodular density, likely mucoid impaction  12/08/17 PFTs: No obstruction, lung volumes low-normal, DLCO 69% predicted, DLCO/VA 136% predicted 12/21/17 PSG: RDI 5.3. CPAP titration study recommended  PROBLEMS: Brittney Brittney [name] Brittney Brittney [name] Brittney Brittney [name] Chronic Brittney [name]  INTERVAL: Last seen by him.  Seen by Dr. Mortimer Brittney [name] 02/02/18.  Treated as acute Brittney [name] with doxycycline for 14 days.  Inhaled steroid was initiated.  SUBJ:  This is a routine reevaluation after seen by Dr. Mortimer Brittney [name] earlier this month as documented above.  Her Brittney [name] is markedly improved.  She is nearly completely resolved but still has scant mucus which is mildly discolored.  She has no new complaints.  She remains on the Flovent inhaler as prescribed by Dr. Mortimer Brittney [name].  She continues to use oxygen with sleep.  She has a CPAP at home which she has tried to comply with but has great difficulty with this.  She continues to work on weight loss with significant dietary changes.  She denies chest pain, fever, hemoptysis.    Vitals:   02/21/18 1130 02/21/18 1142  BP:  (!) 150/84  Pulse:  70  SpO2:  93%  Weight: 131.5 kg (290 lb)   Height: 5\' 6"  (1.676 m)   RA   EXAM:  Gen: Obese, NAD, not coughing during this encounter HEENT: NCAT, sclerae white, oropharynx normal Neck: Supple, JVD not visualized Lungs: breath sounds mildly diminished without wheezes or other adventitious sounds Cardiovascular: RRR, no M noted Abdomen: Obese, soft, nontender, + BS Ext: Very Brittney symmetric  pretibial, ankle and pedal edema Neuro: CNs grossly intact, motor and sensory intact Skin: Limited exam with no lesions noted  DATA:   BMP Latest Ref Rng & Units 10/28/2017 12/07/2016 06/03/2016  Glucose 65 - 99 mg/dL 204(H) 43(LL) -  BUN 6 - 20 mg/dL 14 13 -  Creatinine 0.44 - 1.00 mg/dL 0.60 0.66 0.70  Sodium 135 - 145 mmol/L 137 139 -  Potassium 3.5 - 5.1 mmol/L 3.8 3.7 -  Chloride 101 - 111 mmol/L 101 103 -  CO2 22 - 32 mmol/L 28 28 -  Calcium 8.9 - 10.3 mg/dL 9.5 9.9 -    CBC Latest Ref Rng & Units 01/25/2018 10/05/2017 07/13/2017  WBC 3.6 - 11.0 K/uL 8.9 7.1 8.4  Hemoglobin 12.0 - 16.0 g/dL 12.1 12.3 12.0  Hematocrit 35.0 - 47.0 % 36.9 37.7 36.0  Platelets 150 - 440 K/uL 197 188 202    CXR:  No recent film  IMPRESSION:   Brittney Brittney [name] Brittney Brittney [name] Brittney Brittney [name] Brittney Brittney [name]   Unfortunately, she is having great difficulty tolerating CPAP.  On the other hand, her sleep apnea is rather Brittney.  If she is unable to tolerate CPAP, we can work with other strategies including weight loss and sleep positioning.  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    PLAN:  Continue Flovent inhaler Continue albuterol as needed Continue oxygen therapy with sleep Continue to work with CPAP with sleep.    She is to let us know if she is unable to tolerate CPAP and  we will discontinue it  Encouraged her to continue her excellent efforts at weight loss Cancel follow-up scheduled for next month and follow-up in 3 months   Brittney Border, MD PCCM service Mobile [phone] Pager [phone] 02/22/2018 3:09 PM

## 2018-02-22 NOTE — Telephone Encounter (Signed)
Last 2 OV notes have been faxed to [facility]. Nothing further needed.

## 2018-03-21 ENCOUNTER — Other Ambulatory Visit: Payer: Self-pay | Admitting: Gastroenterology

## 2018-03-21 DIAGNOSIS — K7469 Other cirrhosis of liver: Secondary | ICD-10-CM

## 2018-03-23 ENCOUNTER — Other Ambulatory Visit: Payer: Self-pay | Admitting: *Deleted

## 2018-03-23 ENCOUNTER — Ambulatory Visit: Payer: Medicare Other | Admitting: Pulmonary Disease

## 2018-03-23 MED ORDER — CANDESARTAN CILEXETIL 32 MG PO TABS: 32.0000 mg | ORAL_TABLET | Freq: Every day | ORAL | 6 refills | Status: DC

## 2018-03-27 ENCOUNTER — Ambulatory Visit
Admission: RE | Admit: 2018-03-27 | Discharge: 2018-03-27 | Disposition: A | Discharge status: Home / Self Care | Payer: Medicare Other | Source: Ambulatory Visit | Attending: Gastroenterology | Admitting: Gastroenterology

## 2018-03-27 DIAGNOSIS — K7460 Unspecified cirrhosis of liver: Secondary | ICD-10-CM | POA: Insufficient documentation

## 2018-03-27 DIAGNOSIS — K7469 Other cirrhosis of liver: Secondary | ICD-10-CM

## 2018-03-30 ENCOUNTER — Other Ambulatory Visit: Payer: Self-pay

## 2018-03-30 ENCOUNTER — Ambulatory Visit
Payer: Medicare Other | Attending: Student in an Organized Health Care Education/Training Program | Admitting: Student in an Organized Health Care Education/Training Program

## 2018-03-30 ENCOUNTER — Encounter: Payer: Self-pay | Admitting: Student in an Organized Health Care Education/Training Program

## 2018-03-30 VITALS — BP 158/85 | HR 73 | Temp 98.3°F | Resp 18 | Ht 66.0 in | Wt 297.0 lb

## 2018-03-30 DIAGNOSIS — Z5181 Encounter for therapeutic drug level monitoring: Secondary | ICD-10-CM | POA: Diagnosis present

## 2018-03-30 DIAGNOSIS — M549 Dorsalgia, unspecified: Secondary | ICD-10-CM | POA: Insufficient documentation

## 2018-03-30 DIAGNOSIS — M792 Neuralgia and neuritis, unspecified: Secondary | ICD-10-CM

## 2018-03-30 DIAGNOSIS — M5441 Lumbago with sciatica, right side: Secondary | ICD-10-CM

## 2018-03-30 DIAGNOSIS — M4804 Spinal stenosis, thoracic region: Secondary | ICD-10-CM | POA: Diagnosis not present

## 2018-03-30 DIAGNOSIS — M961 Postlaminectomy syndrome, not elsewhere classified: Secondary | ICD-10-CM | POA: Diagnosis not present

## 2018-03-30 DIAGNOSIS — M797 Fibromyalgia: Secondary | ICD-10-CM | POA: Diagnosis not present

## 2018-03-30 DIAGNOSIS — M48061 Spinal stenosis, lumbar region without neurogenic claudication: Secondary | ICD-10-CM | POA: Insufficient documentation

## 2018-03-30 DIAGNOSIS — Z90710 Acquired absence of both cervix and uterus: Secondary | ICD-10-CM | POA: Diagnosis not present

## 2018-03-30 DIAGNOSIS — M79604 Pain in right leg: Secondary | ICD-10-CM

## 2018-03-30 DIAGNOSIS — M5416 Radiculopathy, lumbar region: Secondary | ICD-10-CM

## 2018-03-30 DIAGNOSIS — M79605 Pain in left leg: Secondary | ICD-10-CM | POA: Diagnosis not present

## 2018-03-30 DIAGNOSIS — M5442 Lumbago with sciatica, left side: Secondary | ICD-10-CM | POA: Diagnosis not present

## 2018-03-30 DIAGNOSIS — G894 Chronic pain syndrome: Secondary | ICD-10-CM | POA: Diagnosis not present

## 2018-03-30 DIAGNOSIS — Z9889 Other specified postprocedural states: Secondary | ICD-10-CM | POA: Insufficient documentation

## 2018-03-30 DIAGNOSIS — M47816 Spondylosis without myelopathy or radiculopathy, lumbar region: Secondary | ICD-10-CM

## 2018-03-30 DIAGNOSIS — M7918 Myalgia, other site: Secondary | ICD-10-CM | POA: Insufficient documentation

## 2018-03-30 DIAGNOSIS — Z9049 Acquired absence of other specified parts of digestive tract: Secondary | ICD-10-CM | POA: Diagnosis not present

## 2018-03-30 DIAGNOSIS — G8929 Other chronic pain: Secondary | ICD-10-CM

## 2018-03-30 MED ORDER — TIZANIDINE HCL 4 MG PO TABS: 4.0000 mg | ORAL_TABLET | Freq: Two times a day (BID) | ORAL | 3 refills | Status: DC | PRN

## 2018-03-30 MED ORDER — OXYCODONE-ACETAMINOPHEN 10-325 MG PO TABS: 1.0000 | ORAL_TABLET | Freq: Every day | ORAL | 0 refills | Status: DC

## 2018-03-30 NOTE — Progress Notes (Signed)
Patient's Name: Brittney [name]  MRN: [id number]  Referring Provider: Rusty Aus, MD  DOB: 1951-09-17  PCP: Rusty Aus, MD  DOS: 03/30/2018  Note by: [name], MD  Service setting: Ambulatory outpatient  Specialty: Interventional Pain Management  Location: [facility] (AMB) Pain Management Facility    Patient type: Established   Primary Reason(s) for Visit: Encounter for prescription drug management & post-procedure evaluation of chronic illness with mild to moderate exacerbation(Level of risk: moderate) CC: Back Pain (low) and Leg Pain (bilateral)  HPI  Brittney [name] is a 67 y.o. year old, female patient, who comes today for a post-procedure evaluation and medication management. She has Abnormal tumor markers; Chronic kidney disease, stage II (mild); Degenerative spondylolisthesis; Diabetes mellitus with peripheral vascular disease ([place]); Diabetes with retinopathy ([place]); Diabetic nephropathy ([place]); DM (diabetes mellitus) type II uncontrolled, periph vascular disorder ([place]); Fibromyalgia; Gastroparesis due to DM ([place]); Generalized osteoarthritis of multiple sites; Hepatic cirrhosis ([place]); Hyperlipemia; Lung nodule, multiple; Microalbuminuria; OSA (obstructive sleep apnea); Seronegative arthritis; Severe uncontrolled hypertension; L3-4 severe lumbar spinal stenosis (12/31/2016 MRI); Type 2 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, with long-term current use of insulin ([place]); Unilateral small kidney; Long term current use of opiate analgesic; Long term prescription opiate use; Opiate use; Chronic pain syndrome; Morbid obesity with BMI of 40.0-44.9, adult ([place]); Type 2 diabetes mellitus with diabetic nephropathy, with long-term current use of insulin ([place]); Chronic low back pain (Location of Primary Source of Pain) (Bilateral) (R>L); 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); Chronic lower extremity pain (Location of Secondary source of pain)  (Bilateral) (R>L); Chronic knee pain (Location of Tertiary source of pain) (Right); Osteoarthritis of knee (Right); Grade 1 Anterolisthesis of L3 over L4 and L4 over L5; Osteoarthritis of sacroiliac joint (Right); L3-4 severe lumbar facet hypertrophy and spinal stenosis; Neurogenic pain; Long term prescription benzodiazepine use; Vitamin D insufficiency; Iron deficiency anemia; Back pain; Status post lumbar spine surgery for decompression of spinal cord; Myofascial pain; and Spinal stenosis of thoracic region on their problem list. Her primarily concern today is the Back Pain (low) and Leg Pain (bilateral)  Pain Assessment: Location: Lower Back Radiating: radiates down both legs.  Feels like muscles are pulling all the way down leg  Onset: More than a month ago Duration:  many hours Quality: Aching(pulling) Severity: 7 /10 (self-reported pain score)  Effect on ADL:  limits ability to do household chores and clean up Timing: Intermittent(when standing) Modifying factors: resting  Brittney [name] was last seen on 02/01/2018 for a procedure. During today's appointment we reviewed Brittney [name]'s post-procedure results, as well as her outpatient medication regimen.  Further details on both, my assessment(s), as well as the proposed treatment plan, please see below.  Controlled Substance Pharmacotherapy Assessment REMS (Risk Evaluation and Mitigation Strategy)  Analgesic:will start Percocet 10 mg daily prn. MME/day: 15 mg/day.  No notes on file Pharmacokinetics: Liberation and absorption (onset of action): WNL Distribution (time to peak effect): WNL Metabolism and excretion (duration of action): WNL         Pharmacodynamics: Desired effects: Analgesia: Brittney [name] reports >50% benefit. Functional ability: Patient reports that medication allows her to accomplish basic ADLs Clinically meaningful improvement in function (CMIF): Sustained CMIF goals met Perceived effectiveness: Described as relatively  effective, allowing for increase in activities of daily living (ADL) Undesirable effects: Side-effects or Adverse reactions: None reported Monitoring: [place] PMP: Online review of the past 75-monthperiod conducted. Compliant with practice rules and regulations  Last UDS on record: Summary  Date Value Ref Range Status  12/07/2016 FINAL  Final    Comment:    ==================================================================== [facility]RE COMP DRUG ANALYSIS,UR ==================================================================== Test                             Result       Flag       Units Drug Present and Declared for Prescription Verification   Alprazolam                     350          EXPECTED   ng/mg creat   Alpha-hydroxyalprazolam        1573         EXPECTED   ng/mg creat    Source of alprazolam is a scheduled prescription medication.    Alpha-hydroxyalprazolam is an expected metabolite of alprazolam.   Butalbital                     PRESENT      EXPECTED   Tramadol                       PRESENT      EXPECTED   O-Desmethyltramadol            PRESENT      EXPECTED   N-Desmethyltramadol            PRESENT      EXPECTED    Source of tramadol is a prescription medication.    O-desmethyltramadol and N-desmethyltramadol are expected    metabolites of tramadol.   Gabapentin                     PRESENT      EXPECTED   Cyclobenzaprine                PRESENT      EXPECTED   Citalopram                     PRESENT      EXPECTED   Desmethylcitalopram            PRESENT      EXPECTED    Desmethylcitalopram is an expected metabolite of citalopram or    the enantiomeric form, escitalopram.   Trazodone                      PRESENT      EXPECTED   1,3 chlorophenyl piperazine    PRESENT      EXPECTED    1,3-chlorophenyl piperazine is an expected metabolite of    trazodone.   Acetaminophen                  PRESENT      EXPECTED   Ibuprofen                      PRESENT      EXPECTED   Atenolol                        PRESENT      EXPECTED Drug Present not Declared for Prescription Verification   Lorazepam                      602  UNEXPECTED ng/mg creat    Source of lorazepam is a scheduled prescription medication. Drug Absent but Declared for Prescription Verification   Zolpidem                       Not Detected UNEXPECTED    Zolpidem, as indicated in the declared medication list, is not    always detected even when used as directed.   Diclofenac                     Not Detected UNEXPECTED    Diclofenac, as indicated in the declared medication list, is not    always detected even when used as directed.   Lidocaine                      Not Detected UNEXPECTED    Lidocaine, as indicated in the declared medication list, is not    always detected even when used as directed.   Clonidine                      Not Detected UNEXPECTED ==================================================================== Test                      Result    Flag   Units      Ref Range   Creatinine              101              mg/dL      >=20 ==================================================================== Declared Medications:  The flagging and interpretation on this report are based on the  following declared medications.  Unexpected results may arise from  inaccuracies in the declared medications.  **Note: The testing scope of this panel includes these medications:  Alprazolam (Xanax)  Atenolol  Butalbital (Fioricet)  Citalopram (Lexapro)  Clonidine  Cyclobenzaprine (Flexeril)  Gabapentin  Tramadol (Ultram)  Trazodone (Desyrel)  **Note: The testing scope of this panel does not include small to  moderate amounts of these reported medications:  Acetaminophen (Fioricet)  Diclofenac (Voltaren)  Ibuprofen  Lidocaine (Lidoderm)  Zolpidem (Ambien)  **Note: The testing scope of this panel does not include following  reported medications:  Albuterol  Doxazosin (Cardura)  Eye Drops   Fluticasone (Flonase)  Hydralazine (Apresoline)  Hydrochlorothiazide  Insulin (Humulin)  Insulin (Levemir)  Levofloxacin (Levaquin)  Linaclotide (Linzess)  Lisinopril  Metformin  Metoclopramide (Reglan)  Ondansetron (Zofran)  Pantoprazole (Protonix)  Potassium  Pramipexole (Mirapex)  Torsemide (Demadex) ==================================================================== For clinical consultation, please call [phone]. ====================================================================    UDS interpretation: Compliant Patient informed of the CDC guidelines and recommendations to stay away from the concomitant use of benzodiazepines and opioids due to the increased risk of respiratory depression and death. Medication Assessment Form: Reviewed. Patient indicates being compliant with therapy Treatment compliance: Compliant Risk Assessment Profile: Aberrant behavior: See prior evaluations. None observed or detected today Comorbid factors increasing risk of overdose: See prior notes. No additional risks detected today Risk of substance use disorder (SUD): Low  ORT Scoring interpretation table:  Score <3 = Low Risk for SUD  Score between 4-7 = Moderate Risk for SUD  Score >8 = High Risk for Opioid Abuse   Risk Mitigation Strategies:  Patient Counseling: Completed today. Counseling provided to patient as per "Patient Counseling Document". Document signed by patient, attesting to counseling and understanding Patient-Prescriber Agreement (PPA): Obtained today  Notification to other healthcare providers: Done  Pharmacologic Plan: Percocet 10 mg daily prn severe pain #10/mo             Post-Procedure Assessment  02/01/2018 Procedure: TPI- left trapezius and cervical region Pre-procedure pain score:  6/10 Post-procedure pain score: 6/10         Influential Factors: BMI: 47.94 kg/m Intra-procedural challenges: None observed.         Assessment challenges: None detected.               Reported side-effects: None.        Post-procedural adverse reactions or complications: None reported         Sedation: Please see nurses note. When no sedatives are used, the analgesic levels obtained are directly associated to the effectiveness of the local anesthetics. However, when sedation is provided, the level of analgesia obtained during the initial 1 hour following the intervention, is believed to be the result of a combination of factors. These factors may include, but are not limited to: 1. The effectiveness of the local anesthetics used. 2. The effects of the analgesic(s) and/or anxiolytic(s) used. 3. The degree of discomfort experienced by the patient at the time of the procedure. 4. The patients ability and reliability in recalling and recording the events. 5. The presence and influence of possible secondary gains and/or psychosocial factors. Reported result: Relief experienced during the 1st hour after the procedure: 100 % (Ultra-Short Term Relief)            Interpretative annotation: Clinically appropriate result. Analgesia during this period is likely to be Local Anesthetic and/or IV Sedative (Analgesic/Anxiolytic) related.          Effects of local anesthetic: The analgesic effects attained during this period are directly associated to the localized infiltration of local anesthetics and therefore cary significant diagnostic value as to the etiological location, or anatomical origin, of the pain. Expected duration of relief is directly dependent on the pharmacodynamics of the local anesthetic used. Long-acting (4-6 hours) anesthetics used.  Reported result: Relief during the next 4 to 6 hour after the procedure: 100 % (Short-Term Relief)            Interpretative annotation: Clinically appropriate result. Analgesia during this period is likely to be Local Anesthetic-related.          Long-term benefit: Defined as the period of time past the expected duration of local  anesthetics (1 hour for short-acting and 4-6 hours for long-acting). With the possible exception of prolonged sympathetic blockade from the local anesthetics, benefits during this period are typically attributed to, or associated with, other factors such as analgesic sensory neuropraxia, antiinflammatory effects, or beneficial biochemical changes provided by agents other than the local anesthetics.  Reported result: Extended relief following procedure: 90 % (Long-Term Relief)            Interpretative annotation: Clinically appropriate result. Good relief. No permanent benefit expected. Inflammation plays a part in the etiology to the pain.          Current benefits: Defined as reported results that persistent at this point in time.   Analgesia: 25-50 %            Function: Somewhat improved ROM: Somewhat improved Interpretative annotation: Recurrence of symptoms. No permanent benefit expected. Effective diagnostic intervention.          Interpretation: Results would suggest a successful diagnostic intervention.                  Plan:  Please  see "Plan of Care" for details.                Laboratory Chemistry  Inflammation Markers (CRP: Acute Phase) (ESR: Chronic Phase) Lab Results  Component Value Date   CRP 1.2 (H) 12/07/2016   ESRSEDRATE 56 (H) 12/07/2016                         Rheumatology Markers No results found for: Elayne Guerin, [facility]                      Renal Function Markers Lab Results  Component Value Date   BUN 14 10/28/2017   CREATININE 0.60 10/28/2017   GFRAA >60 10/28/2017   GFRNONAA >60 10/28/2017                              Hepatic Function Markers Lab Results  Component Value Date   AST 29 10/28/2017   ALT 24 10/28/2017   ALBUMIN 3.4 (L) 10/28/2017   ALKPHOS 103 10/28/2017   LIPASE 114 10/22/2014                        Electrolytes Lab Results  Component Value Date   NA 137 10/28/2017   K 3.8 10/28/2017   CL 101  10/28/2017   CALCIUM 9.5 10/28/2017   MG 1.8 12/07/2016                        Neuropathy Markers Lab Results  Component Value Date   VITAMINB12 787 12/07/2016   HGBA1C 9.3 (H) 10/23/2014                        Bone Pathology Markers Lab Results  Component Value Date   25OHVITD1 26 (L) 12/07/2016   25OHVITD2 1.2 12/07/2016   25OHVITD3 25 12/07/2016                         Coagulation Parameters Lab Results  Component Value Date   INR 0.95 09/20/2016   LABPROT 12.7 09/20/2016   APTT 34.9 10/22/2014   PLT 197 01/25/2018                        Cardiovascular Markers Lab Results  Component Value Date   CKTOTAL 161 10/23/2014   CKMB 1.0 10/23/2014   TROPONINI 0.06 (H) 10/23/2014   HGB 12.1 01/25/2018   HCT 36.9 01/25/2018                         CA Markers No results found for: CEA, CA125, LABCA2                      Note: Lab results reviewed.  Recent Diagnostic Imaging Results  US Abdomen Complete CLINICAL DATA:  Cirrhosis  EXAM: ABDOMEN ULTRASOUND COMPLETE  COMPARISON:  None.  FINDINGS: Gallbladder: Surgically removed  Common bile duct: Diameter: 8.5 mm.  Liver: Increased echogenicity is noted with some nodularity consistent with the underlying given clinical history of cirrhosis. No focal mass is seen. Portal vein is patent on color Doppler imaging with normal direction of blood flow towards the liver.  IVC: No abnormality visualized.  Pancreas: Visualized portion unremarkable.  Spleen: Size  and appearance within normal limits.  Right Kidney: Length: 10.9 cm. Echogenicity within normal limits. No mass or hydronephrosis visualized.  Left Kidney: Length: 12.0 cm. Echogenicity within normal limits. No mass or hydronephrosis visualized.  Abdominal aorta: Atherosclerotic changes are noted without focal aneurysm.  Other findings: None.  IMPRESSION: Changes of cirrhosis without focal mass.  Electronically Signed   By: Inez Catalina M.D.    On: 03/27/2018 12:33  Complexity Note: Imaging results reviewed. Results shared with Brittney [name], using Layman's terms.                         Meds   Current Outpatient Medications:  .  albuterol (VENTOLIN HFA) 108 (90 Base) MCG/ACT inhaler, TAKE 2 PUFFS BY MOUTH 3 TIMES A DAY AS NEEDED FOR SHORTNESS OF BREATH, Disp: , Rfl:  .  ALPRAZolam (XANAX) 0.5 MG tablet, Take 0.5 mg by mouth 4 (four) times daily. , Disp: , Rfl:  .  bumetanide (BUMEX) 1 MG tablet, Take by mouth daily. , Disp: , Rfl:  .  butalbital-acetaminophen-caffeine (FIORICET, ESGIC) 50-325-40 MG tablet, TAKE ONE TABLET BY MOUTH ONCE DAILY, Disp: , Rfl:  .  candesartan (ATACAND) 32 MG tablet, Take 1 tablet (32 mg total) by mouth daily., Disp: 30 tablet, Rfl: 6 .  carvedilol (COREG) 25 MG tablet, Take 37.5-50 mg by mouth 2 (two) times daily with a meal. TAKES 2 TABLETS IN THE MORNING AND 1.5 TABLETS IN THE EVENING, Disp: , Rfl:  .  cloNIDine (CATAPRES - DOSED IN MG/24 HR) 0.3 mg/24hr patch, Place 0.3 mg onto the skin once a week. , Disp: , Rfl:  .  diclofenac sodium (VOLTAREN) 1 % GEL, Apply 4 g topically as needed (PAIN). , Disp: , Rfl:  .  doxazosin (CARDURA) 4 MG tablet, Take 4 mg by mouth at bedtime. , Disp: , Rfl:  .  escitalopram (LEXAPRO) 10 MG tablet, Take 10 mg by mouth daily. , Disp: , Rfl:  .  fluticasone (FLONASE) 50 MCG/ACT nasal spray, PLACE 2 SPRAYS INTO BOTH NOSTRILS 2 (TWO) TIMES DAILY AS NEEDED FOR ALLERGIES, Disp: , Rfl:  .  fluticasone (FLOVENT HFA) 220 MCG/ACT inhaler, Inhale 2 puffs into the lungs 2 (two) times daily., Disp: 1 Inhaler, Rfl: 2 .  gabapentin (NEURONTIN) 600 MG tablet, Take 1 tablet (600 mg total) by mouth 6 (six) times daily. (Patient taking differently: Take 600 mg by mouth See admin instructions. ), Disp: 540 tablet, Rfl: 0 .  hydrALAZINE (APRESOLINE) 25 MG tablet, Take 25 mg by mouth at bedtime. , Disp: , Rfl:  .  ibuprofen (ADVIL,MOTRIN) 800 MG tablet, TAKE 1 TABLET (800 MG TOTAL) BY MOUTH 3  (THREE) TIMES DAILY AS NEEDED FOR PAIN., Disp: , Rfl: 3 .  Insulin Detemir (LEVEMIR FLEXTOUCH) 100 UNIT/ML Pen, INJECT 90 UNITS SUBCUTANEOUSLY TWICE A DAY, Disp: , Rfl:  .  Insulin Pen Needle (FIFTY50 PEN NEEDLES) 32G X 6 MM MISC, 5 (five) times daily., Disp: , Rfl:  .  insulin regular human CONCENTRATED (HUMULIN R) 500 UNIT/ML kwikpen, Inject 70-75 Units into the skin 3 (three) times daily with meals. PER SLIDING SCALE, Disp: , Rfl:  .  ketoconazole (NIZORAL) 2 % shampoo, APPLY TO AFFECTED AREAS (LEAVE ON FOR 5 MINUTES) , RINSE WELL, USE 2-3 TIMES PER WEEK., Disp: , Rfl: 11 .  levothyroxine (SYNTHROID, LEVOTHROID) 150 MCG tablet, PLEASE SEE ATTACHED FOR DETAILED DIRECTIONS, Disp: , Rfl: 11 .  lidocaine (LIDODERM) 5 %, PLACE 1 PATCH  ONTO THE MOST PAINFUL AREA OF SKIN DAILY FOR UP TO 12 HOURS IN A 24 HOUR PERIOD, Disp: , Rfl: 5 .  metoCLOPramide (REGLAN) 10 MG tablet, Take 10 mg by mouth 3 (three) times daily., Disp: , Rfl:  .  pantoprazole (PROTONIX) 40 MG tablet, TAKE 1 TABLET (40 MG TOTAL) BY MOUTH 2 (TWO) TIMES DAILY. TAKE ONE HOUR BEFORE A MEAL, Disp: , Rfl:  .  potassium chloride (K-DUR,KLOR-CON) 10 MEQ tablet, TAKE 1 TABLET BY MOUTH ONCE A DAY, Disp: , Rfl:  .  pramipexole (MIRAPEX) 0.5 MG tablet, TAKE 2 TABLETS BY MOUTH AT BEDTIME, Disp: , Rfl:  .  tiZANidine (ZANAFLEX) 4 MG tablet, Take 1 tablet (4 mg total) by mouth 2 (two) times daily as needed for muscle spasms., Disp: 60 tablet, Rfl: 3 .  traMADol (ULTRAM) 50 MG tablet, Take 2 tablets (100 mg total) by mouth every 8 (eight) hours., Disp: 180 tablet, Rfl: 2 .  traZODone (DESYREL) 150 MG tablet, Take 150 mg by mouth at bedtime. , Disp: , Rfl:  .  zolpidem (AMBIEN) 10 MG tablet, Take 10 mg by mouth at bedtime. , Disp: , Rfl:  .  celecoxib (CELEBREX) 200 MG capsule, Take 1 capsule (200 mg total) 2 (two) times daily by mouth. (Patient not taking: Reported on 03/30/2018), Disp: 8 capsule, Rfl: 0 .  doxycycline (VIBRA-TABS) 100 MG tablet, Take 1  tablet (100 mg total) by mouth 2 (two) times daily. (Patient not taking: Reported on 03/30/2018), Disp: 28 tablet, Rfl: 0 .  oxyCODONE-acetaminophen (PERCOCET) 10-325 MG tablet, Take 1 tablet by mouth daily., Disp: 30 tablet, Rfl: 0  ROS  Constitutional: Denies any fever or chills Gastrointestinal: No reported hemesis, hematochezia, vomiting, or acute GI distress Musculoskeletal: Denies any acute onset joint swelling, redness, loss of ROM, or weakness Neurological: No reported episodes of acute onset apraxia, aphasia, dysarthria, agnosia, amnesia, paralysis, loss of coordination, or loss of consciousness  Allergies  Brittney [name] is allergic to eggs or egg-derived products; shellfish allergy; amlodipine; codeine; imdur [isosorbide dinitrate]; lyrica [pregabalin]; monosodium glutamate; and tape.  [place]  Drug: Brittney [name]  reports that she does not use drugs. Alcohol:  reports that she does not drink alcohol. Tobacco:  reports that she has never smoked. She has never used smokeless tobacco. Medical:  has a past medical history of Abdominal pain, left upper quadrant (12/11/2012), Arthritis, Asthma, Broken leg (2014), Cancer ([place]) (2007), Chronic lower back pain, Collagen vascular disease ([place]), Coronary artery disease, Diabetes mellitus without complication ([place]), Diabetic nephropathy associated with secondary diabetes mellitus ([place]), [place] (12/11/2012), Gastroparesis, Gross hematuria (12/11/2012), Hepatic cirrhosis ([place]), Hyperlipidemia, Hypertension, IBS (irritable bowel syndrome), Kidney stone (12/11/2012), Lower extremity edema, Morbid obesity with BMI of 40.0-44.9, adult ([place]), Myocardial infarction ([place]) (09/2013), Nausea without vomiting (12/11/2012), Nephrolithiasis, Nephrolithiasis (04/26/2014), Nonproliferative retinopathy due to secondary diabetes ([place]), Numbness and tingling of right leg, Peripheral vascular disease ([place]), Renal colic (96/78/9381), Sciatica, Sciatica (12/11/2012), Skin cancer,  Sleep apnea, and Unilateral small kidney without contralateral hypertrophy. Surgical: Brittney [name]  has a past surgical history that includes Cesarean section (1980); Dilation and curettage of uterus; Eye surgery (1986); Abdominal hysterectomy; Tonsillectomy; Esophagogastroduodenoscopy (02/08/2014); Colonoscopy (05/04/2001); Esophagogastroduodenoscopy (egd) with propofol (N/A, 09/20/2016); Colonoscopy with propofol (N/A, 09/20/2016); Mohs surgery (11/2016); Cholecystectomy; Hernia repair; Joint replacement (Left, 09/24/2013); Back surgery (04/1999); and Pulse generator implant (N/A, 11/02/2017). Family: family history includes Asthma in her mother; Cancer in her mother; Diabetes in her father; Heart disease in her mother; Hypertension in her father; Kidney disease  in her father.  Constitutional Exam  General appearance: Well nourished, well developed, and well hydrated. In no apparent acute distress Vitals:   03/30/18 0926  BP: (!) 158/85  Pulse: 73  Resp: 18  Temp: 98.3 F (36.8 C)  SpO2: 93%  Weight: 297 lb (134.7 kg)  Height: '5\' 6"'  (1.676 m)   BMI Assessment: Estimated body mass index is 47.94 kg/m as calculated from the following:   Height as of this encounter: '5\' 6"'  (1.676 m).   Weight as of this encounter: 297 lb (134.7 kg).  BMI interpretation table: BMI level Category Range association with higher incidence of chronic pain  <18 kg/m2 Underweight   18.5-24.9 kg/m2 Ideal body weight   25-29.9 kg/m2 Overweight Increased incidence by 20%  30-34.9 kg/m2 Obese (Class I) Increased incidence by 68%  35-39.9 kg/m2 Severe obesity (Class II) Increased incidence by 136%  >40 kg/m2 Extreme obesity (Class III) Increased incidence by 254%   BMI Readings from Last 4 Encounters:  03/30/18 47.94 kg/m  02/21/18 46.81 kg/m  02/02/18 47.45 kg/m  02/01/18 46.65 kg/m   Wt Readings from Last 4 Encounters:  03/30/18 297 lb (134.7 kg)  02/21/18 290 lb (131.5 kg)  02/02/18 294 lb (133.4 kg)   02/01/18 289 lb (131.1 kg)  Psych/Mental status: Alert, oriented x 3 (person, place, & time)       Eyes: PERLA Respiratory: No evidence of acute respiratory distress  Cervical Spine Area Exam  Skin & Axial Inspection: No masses, redness, edema, swelling, or associated skin lesions Alignment: Symmetrical Functional ROM: Unrestricted ROM      Stability: No instability detected Muscle Tone/Strength: Functionally intact. No obvious neuro-muscular anomalies detected. Sensory (Neurological): Unimpaired Palpation: No palpable anomalies              Upper Extremity (UE) Exam    Side: Right upper extremity  Side: Left upper extremity  Skin & Extremity Inspection: Skin color, temperature, and hair growth are WNL. No peripheral edema or cyanosis. No masses, redness, swelling, asymmetry, or associated skin lesions. No contractures.  Skin & Extremity Inspection: Skin color, temperature, and hair growth are WNL. No peripheral edema or cyanosis. No masses, redness, swelling, asymmetry, or associated skin lesions. No contractures.  Functional ROM: Unrestricted ROM          Functional ROM: Unrestricted ROM          Muscle Tone/Strength: Functionally intact. No obvious neuro-muscular anomalies detected.  Muscle Tone/Strength: Functionally intact. No obvious neuro-muscular anomalies detected.  Sensory (Neurological): Unimpaired          Sensory (Neurological): Unimpaired          Palpation: No palpable anomalies              Palpation: No palpable anomalies              Specialized Test(s): Deferred         Specialized Test(s): Deferred          Thoracic Spine Area Exam  Skin & Axial Inspection: No masses, redness, or swelling Alignment: Symmetrical Functional ROM: Unrestricted ROM Stability: No instability detected Muscle Tone/Strength: Functionally intact. No obvious neuro-muscular anomalies detected. Sensory (Neurological): Unimpaired Muscle strength & Tone: No palpable anomalies   Lumbar Spine  Area Exam  Skin & Axial Inspection: Well healed scar from previous spine surgery detected Alignment: Symmetrical Functional ROM: Decreased ROM      Stability: No instability detected Muscle Tone/Strength: Functionally intact. No obvious neuro-muscular anomalies detected. Sensory (Neurological): Dermatomal  pain pattern Palpation: Complains of area being tender to palpation Bilateral Fist Percussion Test Provocative Tests: Lumbar Hyperextension and rotation test: Positive bilaterally for facet joint pain.R>L Lumbar Lateral bending test: Positive due to fusion restriction. Patrick's Maneuver: evaluation deferred today                    Gait & Posture Assessment  Ambulation: Patient ambulates using a walker Gait: Limited. Using assistive device to ambulate Posture: Difficulty standing up straight, due to pain   Lower Extremity Exam    Side: Right lower extremity  Side: Left lower extremity  Skin & Extremity Inspection: Skin color, temperature, and hair growth are WNL. No peripheral edema or cyanosis. No masses, redness, swelling, asymmetry, or associated skin lesions. No contractures.  Skin & Extremity Inspection: Skin color, temperature, and hair growth are WNL. No peripheral edema or cyanosis. No masses, redness, swelling, asymmetry, or associated skin lesions. No contractures.  Functional ROM: Decreased ROM          Functional ROM: Decreased ROM          Muscle Tone/Strength: Functionally intact. No obvious neuro-muscular anomalies detected.  Muscle Tone/Strength: Functionally intact. No obvious neuro-muscular anomalies detected.  Sensory (Neurological): Paresthesia (Tingling sensation)  Sensory (Neurological): Paresthesia (Burning sensation)  Palpation: No palpable anomalies  Palpation: No palpable anomalies  4 out of 5 strength right plantarflexion, dorsiflexion, hip flexion, knee extension.   Assessment  Primary Diagnosis & Pertinent Problem List: The primary encounter  diagnosis was Failed back surgical syndrome. Diagnoses of Status post lumbar spine surgery for decompression of spinal cord, Myofascial pain, Chronic lower extremity pain (Location of Secondary source of pain) (Bilateral) (R>L), L3-4 severe lumbar facet hypertrophy and spinal stenosis, Neurogenic pain, Fibromyalgia, L3-4 severe lumbar spinal stenosis (12/31/2016 MRI), Chronic pain syndrome, Chronic low back pain (Location of Primary Source of Pain) (Bilateral) (R>L), and Spinal stenosis of thoracic region were also pertinent to this visit.  Status Diagnosis  Persistent Persistent Persistent 1. Failed back surgical syndrome   2. Status post lumbar spine surgery for decompression of spinal cord   3. Myofascial pain   4. Chronic lower extremity pain (Location of Secondary source of pain) (Bilateral) (R>L)   5. L3-4 severe lumbar facet hypertrophy and spinal stenosis   6. Neurogenic pain   7. Fibromyalgia   8. L3-4 severe lumbar spinal stenosis (12/31/2016 MRI)   9. Chronic pain syndrome   10. Chronic low back pain (Location of Primary Source of Pain) (Bilateral) (R>L)   11. Spinal stenosis of thoracic region     Problems updated and reviewed during this visit: Problem  Status Post Lumbar Spine Surgery for Decompression of Spinal Cord  Myofascial Pain  Spinal Stenosis of Thoracic Region   67 year old female status post lumbar spine fusion with chronic neuropathic axial low back and right leg radicular pain status post NEVRO performed on spinal cord stimulator paddle implant performed by Dr. Cari Caraway on December 01, 2017.  Patient somewhat frustrated that the spinal cord stimulator is not providing her with as much axial low back pain relief as she had experienced during her trial.  She does endorse some pain relief of her right knee pain.    Patient follows up today after her cervical and trapezius trigger point injections which she states were helpful for her myofascial pain in that region.   Patient is continuing to see Dr. Olive Bass with [facility] pain psychology which she finds helpful.  Patient states that she is having difficulty performing  activities of daily living and is requesting a stronger pain medicine other than tramadol that she can take only on an as-needed basis when she is having a pain flare.  This is reasonable.  We discussed Percocet 10 mg daily as needed severe pain.  We will also refill the patient's Zanaflex.  I also discussed the importance of physical activity and recommend aquatic therapy for her.  We will provide her with a referral for this.  Plan: -Percocet 10 mg daily as needed severe pain -Refill tizanidine as below -Referral to aquatic therapy -Continue pain psychology management with Dr. Olive Bass -Consider repeating trigger point injections return of cervical/trapezius myofascial pain.   Plan of Care  Pharmacotherapy (Medications Ordered): Meds ordered this encounter  Medications  . tiZANidine (ZANAFLEX) 4 MG tablet    Sig: Take 1 tablet (4 mg total) by mouth 2 (two) times daily as needed for muscle spasms.    Dispense:  60 tablet    Refill:  3  . oxyCODONE-acetaminophen (PERCOCET) 10-325 MG tablet    Sig: Take 1 tablet by mouth daily.    Dispense:  30 tablet    Refill:  0    Do not place this medication, or any other prescription from our practice, on "Automatic Refill". Patient may have prescription filled one day early if pharmacy is closed on scheduled refill date.  For chronic pain To last for 30 days from fill date   Lab-work, procedure(s), and/or referral(s): Orders Placed This Encounter  Procedures  . Ambulatory referral to Physical Therapy     Provider-requested follow-up: Return in about 8 weeks (around 05/25/2018) for Medication Management. Time Note: Greater than 50% of the 25 minute(s) of face-to-face time spent with Brittney [name], was spent in counseling/coordination of care regarding: Brittney [name] primary cause of pain, the treatment  plan, treatment alternatives, the risks and possible complications of proposed treatment, medication side effects, the results, interpretation and significance of  her recent diagnostic interventional treatment(s), the appropriate use of her medications, realistic expectations, the need to bring and keep the BMI below 30, the medication agreement and the patient's responsibilities when it comes to controlled substances. Future Appointments  Date Time Provider [place]  05/24/2018  1:45 PM [facility] None  05/24/2018  2:15 PM Grayland Ormond, Kathlene November, MD [facility] None  05/24/2018  2:30 PM [facility] INFUSION CHAIR 1 [facility] None  05/25/2018  1:30 PM [name], MD [facility] None    Primary Care Physician: Rusty Aus, MD Location: [facility] Note by: [name], M.D Date: 03/30/2018; Time: 1:12 PM  There are no Patient Instructions on file for this visit.

## 2018-04-24 ENCOUNTER — Encounter: Payer: Self-pay | Admitting: Pulmonary Disease

## 2018-04-24 ENCOUNTER — Ambulatory Visit (INDEPENDENT_AMBULATORY_CARE_PROVIDER_SITE_OTHER): Payer: Medicare Other | Admitting: Pulmonary Disease

## 2018-04-24 VITALS — BP 142/90 | HR 68 | Ht 66.0 in | Wt 294.0 lb

## 2018-04-24 DIAGNOSIS — G4733 Obstructive sleep apnea (adult) (pediatric): Secondary | ICD-10-CM | POA: Diagnosis not present

## 2018-04-24 DIAGNOSIS — J069 Acute upper respiratory infection, unspecified: Secondary | ICD-10-CM

## 2018-04-24 NOTE — Patient Instructions (Signed)
Continue CPAP as previously prescribed If your respiratory symptoms do not improve by the end of the week, call Dr. Ammie Ferrier office or our office and we will discuss further Follow-up in 6 months or sooner as needed

## 2018-04-30 NOTE — Progress Notes (Signed)
PULMONARY/SLEEP OFFICE FOLLOW-UP NOTE  Requesting MD/Service: Emily Filbert, MD Date of initial consultation: 09/30/17 Reason for consultation: Chronic cough  PT PROFILE: 67 y.o. female never smoker previously see by Dr Ileene Musa and Dr Raul Del referred for eval of chronic cough of 4 yrs duration  DATA: 05/25/17 CT chest: Normal lung parenchyma. 3 mm posterior segment right upper lobe nodule, stable from 10/15/2014 and therefore benign. 3 mm left lower lobe nodular density, likely mucoid impaction  12/08/17 PFTs: No obstruction, lung volumes low-normal, DLCO 69% predicted, DLCO/VA 136% predicted 12/21/17 PSG: RDI 5.3. CPAP titration study recommended 03/30-04/28/19 compliance: usage 29/30 nights, > 4 hours 23/30 nights  PROBLEMS: Mild OSA Severe obesity Relapsing acute bronchitis Chronic cough  INTERVAL: No major events  SUBJ:  This is a routine reevaluation for OSA and relapsing acute bronchitis. She has had problems with cough, sinus congestion. Reports fever 2 nights ago. Denies ST. Reports mild increase in DOE from baseline. She is using albuterol rescue inhaler @x /day. She remains on the Flovent inhaler as prescribed by Dr. Mortimer Fries.    She is making concerted effort to comply with CPAP and believes that she is benefiting from it with improved daytime hypersomnolence   Vitals:   04/24/18 1438 04/24/18 1443  BP:  (!) 142/90  Pulse:  68  SpO2:  97%  Weight: 294 lb (133.4 kg)   Height: 5\' 6"  (1.676 m)   RA   EXAM:  Gen: obese, NAD HEENT: NCAT, sclerae white, OP normal Neck: No JVD, no LAN Lungs: BS diminished, no wheezes Cardiovascular: RRR, no M Abdomen: soft, NABS, NT Ext: no C/C/E Neuro: no focal deficits  DATA:   BMP Latest Ref Rng & Units 10/28/2017 12/07/2016 06/03/2016  Glucose 65 - 99 mg/dL 204(H) 43(LL) -  BUN 6 - 20 mg/dL 14 13 -  Creatinine 0.44 - 1.00 mg/dL 0.60 0.66 0.70  Sodium 135 - 145 mmol/L 137 139 -  Potassium 3.5 - 5.1 mmol/L 3.8 3.7 -  Chloride 101  - 111 mmol/L 101 103 -  CO2 22 - 32 mmol/L 28 28 -  Calcium 8.9 - 10.3 mg/dL 9.5 9.9 -    CBC Latest Ref Rng & Units 01/25/2018 10/05/2017 07/13/2017  WBC 3.6 - 11.0 K/uL 8.9 7.1 8.4  Hemoglobin 12.0 - 16.0 g/dL 12.1 12.3 12.0  Hematocrit 35.0 - 47.0 % 36.9 37.7 36.0  Platelets 150 - 440 K/uL 197 188 202    CXR:  No new film  IMPRESSION:   Obstructive sleep apnea, adequately controlled  URI versus allergy symptoms     PLAN:  Continue Flovent inhaler Continue albuterol as needed Continue CPAP as previously prescribed If her respiratory symptoms do not improve by the end of the week, she is to call our office and we will discuss further Follow-up in 6 months or sooner as needed   Brittney Border, MD PCCM service Mobile [phone] Pager [phone] 04/30/2018 4:01 PM

## 2018-05-08 ENCOUNTER — Other Ambulatory Visit: Payer: Self-pay | Admitting: Internal Medicine

## 2018-05-22 NOTE — Progress Notes (Signed)
[place]  [phone] [phone]) [phone]  ID: Freeman Caldron OB: 1951/10/13  MR#: [id number]  BTD#:[id number]  Patient Care Team: Rusty Aus, MD as PCP - General (Internal Medicine)  CHIEF COMPLAINT: Iron deficiency anemia.  INTERVAL HISTORY: Patient returns to clinic today for further evaluation and consideration of additional IV Feraheme.  She does not complain of weakness or fatigue today.  She currently feels well and is asymptomatic. She has no neurologic complaints. She denies any recent fevers or illnesses. She has a good appetite and denies weight loss. She denies any chest pain or shortness of breath. She has no nausea, vomiting, constipation, or diarrhea. She denies any melena or hematochezia. She has no urinary complaints.  Patient feels at her baseline offers no specific complaints today.    REVIEW OF SYSTEMS:   Review of Systems  Constitutional: Negative for fever, malaise/fatigue and weight loss.  Respiratory: Negative.  Negative for cough, hemoptysis and shortness of breath.   Cardiovascular: Negative.  Negative for chest pain and leg swelling.  Gastrointestinal: Negative.  Negative for abdominal pain, blood in stool and melena.  Genitourinary: Negative.  Negative for dysuria.  Musculoskeletal: Positive for back pain.  Skin: Negative.  Negative for rash.  Neurological: Negative.  Negative for sensory change, focal weakness and weakness.  Psychiatric/Behavioral: Negative.  The patient is not nervous/anxious.     As per HPI. Otherwise, a complete review of systems is negative.  PAST MEDICAL HISTORY: Past Medical History:  Diagnosis Date  . Abdominal pain, left upper quadrant 12/11/2012  . Arthritis   . Asthma   . Broken leg 2014   fell twice and broke same leg. had a bone stimulator for first break.  . Cancer ([place]) 2007   melanoma, left ear and back of left leg, removed from back  . Chronic kidney disease, stage 2 (mild)   .  Chronic lower back pain   . Collagen vascular disease ([place])   . Coronary artery disease   . Diabetes mellitus without complication ([place])   . Diabetic nephropathy associated with secondary diabetes mellitus ([place])   . [place] 12/11/2012  . Gastroparesis   . Gross hematuria 12/11/2012  . Hepatic cirrhosis ([place])   . Hyperlipidemia   . Hypertension   . Hypothyroidism   . Hypothyroidism   . IBS (irritable bowel syndrome)   . Kidney stone 12/11/2012  . Lower extremity edema   . Morbid obesity with BMI of 40.0-44.9, adult ([place])   . Myocardial infarction ([place]) 09/2013  . Nausea without vomiting 12/11/2012  . Nephrolithiasis   . Nephrolithiasis 04/26/2014  . Nonproliferative retinopathy due to secondary diabetes ([place])   . Numbness and tingling of right leg   . Peripheral vascular disease ([place])   . Renal colic 10/62/6948  . Sciatica   . Sciatica 12/11/2012  . Skin cancer   . Sleep apnea    sleep study coming in november  . Unilateral small kidney without contralateral hypertrophy     PAST SURGICAL HISTORY: Past Surgical History:  Procedure Laterality Date  . ABDOMINAL HYSTERECTOMY    . BACK SURGERY  04/1999   L-4-5 LAMINECTOMY. metal in lower back and neck  . CESAREAN SECTION  1980  . CHOLECYSTECTOMY    . COLONOSCOPY  05/04/2001  . COLONOSCOPY WITH PROPOFOL N/A 09/20/2016   Procedure: COLONOSCOPY WITH PROPOFOL;  Surgeon: Lollie Sails, MD;  [facility] ENDOSCOPY;  Service: Endoscopy;  Laterality: N/A;  . DILATION AND CURETTAGE OF UTERUS    .  ESOPHAGOGASTRODUODENOSCOPY  02/08/2014  . ESOPHAGOGASTRODUODENOSCOPY (EGD) WITH PROPOFOL N/A 09/20/2016   Procedure: ESOPHAGOGASTRODUODENOSCOPY (EGD) WITH PROPOFOL;  Surgeon: Lollie Sails, MD;  Location: [facility] ENDOSCOPY;  Service: Endoscopy;  Laterality: N/A;  . ESOPHAGOGASTRODUODENOSCOPY (EGD) WITH PROPOFOL N/A 05/23/2018   Procedure: ESOPHAGOGASTRODUODENOSCOPY (EGD) WITH PROPOFOL;  Surgeon: Lollie Sails, MD;  Location: [facility]  ENDOSCOPY;  Service: Endoscopy;  Laterality: N/A;  . EYE SURGERY  1986   FOR MELANOMA  . HERNIA REPAIR    . JOINT REPLACEMENT Left 09/24/2013   TOTAL KNEE  . MOHS SURGERY  11/2016   left side of nose  . PULSE GENERATOR IMPLANT N/A 11/02/2017   Procedure: UNILATERAL PULSE GENERATOR IMPLANT;  Surgeon: Meade Maw, MD;  Location: [facility] ORS;  Service: Neurosurgery;  Laterality: N/A;  . TONSILLECTOMY     WITH ADENOIDECTOMY    FAMILY HISTORY: Family History  Problem Relation Age of Onset  . Heart disease Mother   . Cancer Mother   . Asthma Mother   . Diabetes Father   . Kidney disease Father   . Hypertension Father   . Breast cancer Neg Hx     ADVANCED DIRECTIVES (Y/N):  N  HEALTH MAINTENANCE: Social History   Tobacco Use  . Smoking status: Never Smoker  . Smokeless tobacco: Never Used  Substance Use Topics  . Alcohol use: No  . Drug use: No     Colonoscopy:  PAP:  Bone density:  Lipid panel:  Allergies  Allergen Reactions  . Eggs Or Egg-Derived Products Nausea And Vomiting    Only has reaction to flu vaccine due to eggs, no reaction to egg food products  . Shellfish Allergy Nausea And Vomiting, Diarrhea and Nausea Only    Non stop sickness once ingests shellfish. Betadine is okay  . Amlodipine Cough  . Codeine Nausea And Vomiting and Nausea Only    states she can take codeine cough syrup without problems states she can take codeine cough syrup without problems  . Imdur [Isosorbide Dinitrate] Other (See Comments)    Headache  . Lyrica [Pregabalin] Swelling and Other (See Comments)    "FLU-LIKE" SYMPTOMS  . Monosodium Glutamate Other (See Comments)    Headache  . Tape Other (See Comments) and Nausea And Vomiting    can use paper tape Redness    Current Outpatient Medications  Medication Sig Dispense Refill  . ALPRAZolam (XANAX) 0.5 MG tablet Take 0.5 mg by mouth 4 (four) times daily.     . bumetanide (BUMEX) 1 MG tablet Take by mouth daily.     .  butalbital-acetaminophen-caffeine (FIORICET, ESGIC) 50-325-40 MG tablet TAKE ONE TABLET BY MOUTH ONCE DAILY    . candesartan (ATACAND) 32 MG tablet Take 1 tablet (32 mg total) by mouth daily. 30 tablet 6  . carvedilol (COREG) 25 MG tablet Take 37.5-50 mg by mouth 2 (two) times daily with a meal. TAKES 2 TABLETS IN THE MORNING AND 1.5 TABLETS IN THE EVENING    . cloNIDine (CATAPRES - DOSED IN MG/24 HR) 0.3 mg/24hr patch Place 0.3 mg onto the skin once a week.     . cyclobenzaprine (FLEXERIL) 10 MG tablet Take 20 mg by mouth at bedtime.    Marland Kitchen doxazosin (CARDURA) 4 MG tablet Take 4 mg by mouth at bedtime.     Marland Kitchen escitalopram (LEXAPRO) 10 MG tablet Take 10 mg by mouth daily.     . fluticasone (FLONASE) 50 MCG/ACT nasal spray PLACE 2 SPRAYS INTO BOTH NOSTRILS 2 (TWO) TIMES DAILY AS  NEEDED FOR ALLERGIES    . hydrALAZINE (APRESOLINE) 25 MG tablet Take 25 mg by mouth at bedtime.     Marland Kitchen ibuprofen (ADVIL,MOTRIN) 800 MG tablet TAKE 1 TABLET (800 MG TOTAL) BY MOUTH 3 (THREE) TIMES DAILY AS NEEDED FOR PAIN.  3  . Insulin Detemir (LEVEMIR FLEXTOUCH) 100 UNIT/ML Pen INJECT 90 UNITS SUBCUTANEOUSLY TWICE A DAY    . insulin regular human CONCENTRATED (HUMULIN R) 500 UNIT/ML kwikpen Inject 70-75 Units into the skin 3 (three) times daily with meals. PER SLIDING SCALE    . levothyroxine (SYNTHROID, LEVOTHROID) 150 MCG tablet PLEASE SEE ATTACHED FOR DETAILED DIRECTIONS  11  . metoCLOPramide (REGLAN) 10 MG tablet Take 10 mg by mouth 3 (three) times daily.    . pantoprazole (PROTONIX) 40 MG tablet TAKE 1 TABLET (40 MG TOTAL) BY MOUTH 2 (TWO) TIMES DAILY. TAKE ONE HOUR BEFORE A MEAL    . potassium chloride (K-DUR,KLOR-CON) 10 MEQ tablet TAKE 1 TABLET BY MOUTH ONCE A DAY    . pramipexole (MIRAPEX) 0.5 MG tablet TAKE 2 TABLETS BY MOUTH AT BEDTIME    . tiZANidine (ZANAFLEX) 4 MG tablet Take 1 tablet (4 mg total) by mouth 2 (two) times daily as needed for muscle spasms. 60 tablet 3  . traMADol (ULTRAM) 50 MG tablet Take 2 tablets  (100 mg total) by mouth every 8 (eight) hours. 180 tablet 2  . traZODone (DESYREL) 150 MG tablet Take 150 mg by mouth at bedtime.     Marland Kitchen zolpidem (AMBIEN) 10 MG tablet Take 10 mg by mouth at bedtime.     Marland Kitchen albuterol (VENTOLIN HFA) 108 (90 Base) MCG/ACT inhaler TAKE 2 PUFFS BY MOUTH 3 TIMES A DAY AS NEEDED FOR SHORTNESS OF BREATH    . diclofenac sodium (VOLTAREN) 1 % GEL Apply 4 g topically as needed (PAIN).     Marland Kitchen FLOVENT HFA 220 MCG/ACT inhaler TAKE 2 PUFFS BY MOUTH TWICE A DAY 12 Inhaler 2  . gabapentin (NEURONTIN) 600 MG tablet Take 1 tablet (600 mg total) by mouth 6 (six) times daily. (Patient taking differently: Take 600 mg by mouth See admin instructions. ) 540 tablet 0  . Insulin Pen Needle (FIFTY50 PEN NEEDLES) 32G X 6 MM MISC 5 (five) times daily.    Marland Kitchen ketoconazole (NIZORAL) 2 % shampoo APPLY TO AFFECTED AREAS (LEAVE ON FOR 5 MINUTES) , RINSE WELL, USE 2-3 TIMES PER WEEK.  11  . lactulose (CHRONULAC) 10 GM/15ML solution Take 10 g by mouth daily as needed for mild constipation.    . lidocaine (LIDODERM) 5 % PLACE 1 PATCH ONTO THE MOST PAINFUL AREA OF SKIN DAILY FOR UP TO 12 HOURS IN A 24 HOUR PERIOD  5  . oxyCODONE-acetaminophen (PERCOCET) 10-325 MG tablet Take 1 tablet by mouth daily. 30 tablet 0   No current facility-administered medications for this visit.     OBJECTIVE: Vitals:   05/24/18 1420  BP: (!) 183/83  Pulse: 68  Resp: 18  Temp: (!) 97.2 F (36.2 C)     Body mass index is 47.4 kg/m.    ECOG FS:0 - Asymptomatic  General: Well-developed, well-nourished, no acute distress. Eyes: Pink conjunctiva, anicteric sclera. Lungs: Clear to auscultation bilaterally. Heart: Regular rate and rhythm. No rubs, murmurs, or gallops. Abdomen: Soft, nontender, nondistended. No organomegaly noted, normoactive bowel sounds. Musculoskeletal: No edema, cyanosis, or clubbing. Neuro: Alert, answering all questions appropriately. Cranial nerves grossly intact. Skin: No rashes or petechiae  noted. Psych: Normal affect.  LAB RESULTS:  Lab  Results  Component Value Date   NA 137 10/28/2017   K 3.8 10/28/2017   CL 101 10/28/2017   CO2 28 10/28/2017   GLUCOSE 204 (H) 10/28/2017   BUN 14 10/28/2017   CREATININE 0.60 10/28/2017   CALCIUM 9.5 10/28/2017   PROT 7.0 10/28/2017   ALBUMIN 3.4 (L) 10/28/2017   AST 29 10/28/2017   ALT 24 10/28/2017   ALKPHOS 103 10/28/2017   BILITOT 0.6 10/28/2017   GFRNONAA >60 10/28/2017   GFRAA >60 10/28/2017    Lab Results  Component Value Date   WBC 9.1 05/24/2018   NEUTROABS 5.7 05/24/2018   HGB 12.3 05/24/2018   HCT 36.5 05/24/2018   MCV 86.4 05/24/2018   PLT 172 05/24/2018   Lab Results  Component Value Date   IRON 60 05/24/2018   TIBC 284 05/24/2018   IRONPCTSAT 21 05/24/2018   Lab Results  Component Value Date   FERRITIN 64 05/24/2018     STUDIES: No results found.  ASSESSMENT: Iron deficiency anemia.  PLAN:    1. Iron deficiency anemia: Patient's hemoglobin and iron stores continue to be within the normal limits.  Patient is also asymptomatic. Previously, the remainder of her laboratory work was also either negative or within normal limits. She does not require additional Feraheme today. Patient last received treatment on July 13, 2017. Colonoscopy and EGD completed on September 20, 2016 were essentially normal other than some mild gastritis.  Return to clinic in 4 months with repeat laboratory work and further evaluation.  If patient's hemoglobin and iron stores continue to be normal at that time, she possibly could be discharged from clinic.   2. Hypertension: Patient's blood pressure significantly elevated today. Continue evaluation and treatment per primary care. 3. Back pain: Chronic and unchanged.  Patient has a permanent spinal stimulator in place.  Approximately 20 minutes was spent in discussion of which greater than 50% was consultation.  Patient expressed understanding and was in agreement with this  plan. She also understands that She can call clinic at any time with any questions, concerns, or complaints.    Lloyd Huger, MD   05/28/2018 9:44 PM

## 2018-05-23 ENCOUNTER — Ambulatory Visit: Admit: 2018-05-23 | Payer: Medicare Other | Admitting: Gastroenterology

## 2018-05-23 ENCOUNTER — Ambulatory Visit
Admission: RE | Admit: 2018-05-23 | Discharge: 2018-05-23 | Disposition: A | Discharge status: Home / Self Care | Payer: Medicare Other | Source: Ambulatory Visit | Attending: Gastroenterology | Admitting: Gastroenterology

## 2018-05-23 ENCOUNTER — Encounter
Admission: RE | Disposition: A | Discharge status: Home / Self Care | Payer: Self-pay | Source: Ambulatory Visit | Attending: Gastroenterology

## 2018-05-23 ENCOUNTER — Other Ambulatory Visit: Payer: Self-pay | Admitting: *Deleted

## 2018-05-23 ENCOUNTER — Ambulatory Visit: Payer: Medicare Other | Admitting: Anesthesiology

## 2018-05-23 ENCOUNTER — Encounter: Payer: Medicare Other | Admitting: Student in an Organized Health Care Education/Training Program

## 2018-05-23 ENCOUNTER — Encounter: Payer: Self-pay | Admitting: Anesthesiology

## 2018-05-23 DIAGNOSIS — K317 Polyp of stomach and duodenum: Secondary | ICD-10-CM | POA: Diagnosis not present

## 2018-05-23 DIAGNOSIS — K319 Disease of stomach and duodenum, unspecified: Secondary | ICD-10-CM | POA: Diagnosis not present

## 2018-05-23 DIAGNOSIS — Z79899 Other long term (current) drug therapy: Secondary | ICD-10-CM | POA: Diagnosis not present

## 2018-05-23 DIAGNOSIS — Z885 Allergy status to narcotic agent status: Secondary | ICD-10-CM | POA: Insufficient documentation

## 2018-05-23 DIAGNOSIS — Z6841 Body Mass Index (BMI) 40.0 and over, adult: Secondary | ICD-10-CM | POA: Insufficient documentation

## 2018-05-23 DIAGNOSIS — Z794 Long term (current) use of insulin: Secondary | ICD-10-CM | POA: Diagnosis not present

## 2018-05-23 DIAGNOSIS — R1013 Epigastric pain: Secondary | ICD-10-CM | POA: Insufficient documentation

## 2018-05-23 DIAGNOSIS — E039 Hypothyroidism, unspecified: Secondary | ICD-10-CM | POA: Insufficient documentation

## 2018-05-23 DIAGNOSIS — K7460 Unspecified cirrhosis of liver: Secondary | ICD-10-CM | POA: Diagnosis not present

## 2018-05-23 DIAGNOSIS — I2510 Atherosclerotic heart disease of native coronary artery without angina pectoris: Secondary | ICD-10-CM | POA: Insufficient documentation

## 2018-05-23 DIAGNOSIS — E1122 Type 2 diabetes mellitus with diabetic chronic kidney disease: Secondary | ICD-10-CM | POA: Diagnosis not present

## 2018-05-23 DIAGNOSIS — Z91012 Allergy to eggs: Secondary | ICD-10-CM | POA: Insufficient documentation

## 2018-05-23 DIAGNOSIS — K760 Fatty (change of) liver, not elsewhere classified: Secondary | ICD-10-CM | POA: Diagnosis not present

## 2018-05-23 DIAGNOSIS — Z91013 Allergy to seafood: Secondary | ICD-10-CM | POA: Insufficient documentation

## 2018-05-23 DIAGNOSIS — D649 Anemia, unspecified: Secondary | ICD-10-CM

## 2018-05-23 DIAGNOSIS — Z85828 Personal history of other malignant neoplasm of skin: Secondary | ICD-10-CM | POA: Diagnosis not present

## 2018-05-23 DIAGNOSIS — N182 Chronic kidney disease, stage 2 (mild): Secondary | ICD-10-CM | POA: Insufficient documentation

## 2018-05-23 DIAGNOSIS — K228 Other specified diseases of esophagus: Secondary | ICD-10-CM | POA: Diagnosis not present

## 2018-05-23 DIAGNOSIS — J45909 Unspecified asthma, uncomplicated: Secondary | ICD-10-CM | POA: Insufficient documentation

## 2018-05-23 DIAGNOSIS — I252 Old myocardial infarction: Secondary | ICD-10-CM | POA: Insufficient documentation

## 2018-05-23 DIAGNOSIS — Z7951 Long term (current) use of inhaled steroids: Secondary | ICD-10-CM | POA: Insufficient documentation

## 2018-05-23 DIAGNOSIS — E1121 Type 2 diabetes mellitus with diabetic nephropathy: Secondary | ICD-10-CM | POA: Insufficient documentation

## 2018-05-23 DIAGNOSIS — I129 Hypertensive chronic kidney disease with stage 1 through stage 4 chronic kidney disease, or unspecified chronic kidney disease: Secondary | ICD-10-CM | POA: Insufficient documentation

## 2018-05-23 DIAGNOSIS — K449 Diaphragmatic hernia without obstruction or gangrene: Secondary | ICD-10-CM | POA: Insufficient documentation

## 2018-05-23 DIAGNOSIS — Z79890 Hormone replacement therapy (postmenopausal): Secondary | ICD-10-CM | POA: Diagnosis not present

## 2018-05-23 DIAGNOSIS — M797 Fibromyalgia: Secondary | ICD-10-CM | POA: Insufficient documentation

## 2018-05-23 DIAGNOSIS — Z888 Allergy status to other drugs, medicaments and biological substances status: Secondary | ICD-10-CM | POA: Insufficient documentation

## 2018-05-23 DIAGNOSIS — E1151 Type 2 diabetes mellitus with diabetic peripheral angiopathy without gangrene: Secondary | ICD-10-CM | POA: Diagnosis not present

## 2018-05-23 DIAGNOSIS — E113299 Type 2 diabetes mellitus with mild nonproliferative diabetic retinopathy without macular edema, unspecified eye: Secondary | ICD-10-CM | POA: Diagnosis not present

## 2018-05-23 DIAGNOSIS — E785 Hyperlipidemia, unspecified: Secondary | ICD-10-CM | POA: Diagnosis not present

## 2018-05-23 HISTORY — PX: ESOPHAGOGASTRODUODENOSCOPY (EGD) WITH PROPOFOL: SHX5813

## 2018-05-23 HISTORY — DX: Hypothyroidism, unspecified: E03.9

## 2018-05-23 HISTORY — DX: Chronic kidney disease, stage 2 (mild): N18.2

## 2018-05-23 LAB — CBC WITH DIFFERENTIAL/PLATELET
BASOS ABS: 0.1 10*3/uL (ref 0–0.1)
BASOS PCT: 1 %
EOS PCT: 4 %
Eosinophils Absolute: 0.3 10*3/uL (ref 0–0.7)
HEMATOCRIT: 39.6 % (ref 35.0–47.0)
Hemoglobin: 13.1 g/dL (ref 12.0–16.0)
Lymphocytes Relative: 32 %
Lymphs Abs: 2.8 10*3/uL (ref 1.0–3.6)
MCH: 28.6 pg (ref 26.0–34.0)
MCHC: 33.2 g/dL (ref 32.0–36.0)
MCV: 86.3 fL (ref 80.0–100.0)
MONO ABS: 0.5 10*3/uL (ref 0.2–0.9)
MONOS PCT: 5 %
NEUTROS ABS: 5.1 10*3/uL (ref 1.4–6.5)
Neutrophils Relative %: 58 %
PLATELETS: 168 10*3/uL (ref 150–440)
RBC: 4.59 MIL/uL (ref 3.80–5.20)
RDW: 14.4 % (ref 11.5–14.5)
WBC: 8.7 10*3/uL (ref 3.6–11.0)

## 2018-05-23 LAB — PROTIME-INR
INR: 0.95
Prothrombin Time: 12.6 seconds (ref 11.4–15.2)

## 2018-05-23 LAB — GLUCOSE, CAPILLARY: Glucose-Capillary: 166 mg/dL — ABNORMAL HIGH (ref 65–99)

## 2018-05-23 SURGERY — ESOPHAGOGASTRODUODENOSCOPY (EGD) WITH PROPOFOL
Anesthesia: General

## 2018-05-23 MED ORDER — FENTANYL CITRATE (PF) 100 MCG/2ML IJ SOLN
INTRAMUSCULAR | Status: DC | PRN
  Administered 2018-05-23 (×2): 50 ug via INTRAVENOUS

## 2018-05-23 MED ORDER — LIDOCAINE HCL (PF) 1 % IJ SOLN
INTRAMUSCULAR | Status: AC
  Filled 2018-05-23: qty 2

## 2018-05-23 MED ORDER — FENTANYL CITRATE (PF) 100 MCG/2ML IJ SOLN
INTRAMUSCULAR | Status: AC
  Filled 2018-05-23: qty 2

## 2018-05-23 MED ORDER — PROPOFOL 10 MG/ML IV BOLUS
INTRAVENOUS | Status: DC | PRN
  Administered 2018-05-23: 50 mg via INTRAVENOUS
  Administered 2018-05-23: 100 mg via INTRAVENOUS
  Administered 2018-05-23: 50 mg via INTRAVENOUS

## 2018-05-23 MED ORDER — LIDOCAINE HCL (CARDIAC) PF 100 MG/5ML IV SOSY
PREFILLED_SYRINGE | INTRAVENOUS | Status: DC | PRN
  Administered 2018-05-23: 80 mg via INTRAVENOUS

## 2018-05-23 MED ORDER — SODIUM CHLORIDE 0.9 % IV SOLN: INTRAVENOUS | Status: DC

## 2018-05-23 MED ORDER — PROPOFOL 10 MG/ML IV BOLUS
INTRAVENOUS | Status: AC
  Filled 2018-05-23: qty 20

## 2018-05-23 MED ORDER — SODIUM CHLORIDE 0.9 % IV SOLN
INTRAVENOUS | Status: DC
  Administered 2018-05-23: 1000 mL via INTRAVENOUS

## 2018-05-23 NOTE — Anesthesia Preprocedure Evaluation (Signed)
Anesthesia Evaluation  Patient identified by MRN, date of birth, ID band Patient awake    Reviewed: Allergy & Precautions, NPO status , Patient's Chart, lab work & pertinent test results, reviewed documented beta blocker date and time   Airway Mallampati: III  TM Distance: >3 FB     Dental  (+) Chipped   Pulmonary asthma , sleep apnea ,           Cardiovascular hypertension, Pt. on medications and Pt. on home beta blockers + CAD, + Past MI and + Peripheral Vascular Disease       Neuro/Psych  Neuromuscular disease    GI/Hepatic   Endo/Other  diabetes, Type 2Hypothyroidism Morbid obesity  Renal/GU Renal disease     Musculoskeletal  (+) Arthritis , Fibromyalgia -  Abdominal   Peds  Hematology  (+) anemia ,   Anesthesia Other Findings Spinal cord stimulator.  Reproductive/Obstetrics                             Anesthesia Physical Anesthesia Plan  ASA: II  Anesthesia Plan: General   Post-op Pain Management:    Induction: Intravenous  PONV Risk Score and Plan:   Airway Management Planned:   Additional Equipment:   Intra-op Plan:   Post-operative Plan:   Informed Consent: I have reviewed the patients History and Physical, chart, labs and discussed the procedure including the risks, benefits and alternatives for the proposed anesthesia with the patient or authorized representative who has indicated his/her understanding and acceptance.     Plan Discussed with: CRNA  Anesthesia Plan Comments:         Anesthesia Quick Evaluation

## 2018-05-23 NOTE — Progress Notes (Signed)
cbc

## 2018-05-23 NOTE — H&P (Addendum)
Outpatient short stay form Pre-procedure 05/23/2018 11:01 AM Lollie Sails MD  Primary Physician: Emily Filbert, MD  Reason for visit: Patient is a 67 year old female presenting today as above, EGD, for variceal screening.  She has personal history of NAFLD related cirrhosis.  Her last EGD was about 2 years ago.  There are no varices at that time.  She is presenting today for further evaluation in this regard.  She does have a history of gastroparesis and insulin dependent diabetes mellitus.  She currently takes no aspirin or NSAID products.  He takes no blood thinner.  History of present illness: As noted above    Current Facility-Administered Medications:  .  0.9 %  sodium chloride infusion, , Intravenous, Continuous, Lollie Sails, MD .  0.9 %  sodium chloride infusion, , Intravenous, Continuous, Lollie Sails, MD  Medications Prior to Admission  Medication Sig Dispense Refill Last Dose  . ALPRAZolam (XANAX) 0.5 MG tablet Take 0.5 mg by mouth 4 (four) times daily.    05/22/2018 at Unknown time  . bumetanide (BUMEX) 1 MG tablet Take by mouth daily.    Past Week at Unknown time  . butalbital-acetaminophen-caffeine (FIORICET, ESGIC) 50-325-40 MG tablet TAKE ONE TABLET BY MOUTH ONCE DAILY   Past Month at Unknown time  . candesartan (ATACAND) 32 MG tablet Take 1 tablet (32 mg total) by mouth daily. 30 tablet 6 05/22/2018 at Unknown time  . carvedilol (COREG) 25 MG tablet Take 37.5-50 mg by mouth 2 (two) times daily with a meal. TAKES 2 TABLETS IN THE MORNING AND 1.5 TABLETS IN THE EVENING   05/22/2018 at Unknown time  . cloNIDine (CATAPRES - DOSED IN MG/24 HR) 0.3 mg/24hr patch Place 0.3 mg onto the skin once a week.    05/22/2018 at Unknown time  . cyclobenzaprine (FLEXERIL) 10 MG tablet Take 20 mg by mouth at bedtime.   05/22/2018 at Unknown time  . diclofenac sodium (VOLTAREN) 1 % GEL Apply 4 g topically as needed (PAIN).    Past Month at Unknown time  . doxazosin (CARDURA) 4 MG  tablet Take 4 mg by mouth at bedtime.    05/22/2018 at Unknown time  . escitalopram (LEXAPRO) 10 MG tablet Take 10 mg by mouth daily.    05/22/2018 at Unknown time  . FLOVENT HFA 220 MCG/ACT inhaler TAKE 2 PUFFS BY MOUTH TWICE A DAY 12 Inhaler 2 05/23/2018 at 0915  . fluticasone (FLONASE) 50 MCG/ACT nasal spray PLACE 2 SPRAYS INTO BOTH NOSTRILS 2 (TWO) TIMES DAILY AS NEEDED FOR ALLERGIES   05/22/2018 at Unknown time  . hydrALAZINE (APRESOLINE) 25 MG tablet Take 25 mg by mouth at bedtime.    05/22/2018 at Unknown time  . ibuprofen (ADVIL,MOTRIN) 800 MG tablet TAKE 1 TABLET (800 MG TOTAL) BY MOUTH 3 (THREE) TIMES DAILY AS NEEDED FOR PAIN.  3 05/22/2018 at Unknown time  . Insulin Detemir (LEVEMIR FLEXTOUCH) 100 UNIT/ML Pen INJECT 90 UNITS SUBCUTANEOUSLY TWICE A DAY   05/22/2018 at Unknown time  . Insulin Pen Needle (FIFTY50 PEN NEEDLES) 32G X 6 MM MISC 5 (five) times daily.   05/22/2018 at Unknown time  . insulin regular human CONCENTRATED (HUMULIN R) 500 UNIT/ML kwikpen Inject 70-75 Units into the skin 3 (three) times daily with meals. PER SLIDING SCALE   05/22/2018 at Unknown time  . ketoconazole (NIZORAL) 2 % shampoo APPLY TO AFFECTED AREAS (LEAVE ON FOR 5 MINUTES) , RINSE WELL, USE 2-3 TIMES PER WEEK.  11 Past Week at Unknown  time  . levothyroxine (SYNTHROID, LEVOTHROID) 150 MCG tablet PLEASE SEE ATTACHED FOR DETAILED DIRECTIONS  11 05/22/2018 at Unknown time  . metoCLOPramide (REGLAN) 10 MG tablet Take 10 mg by mouth 3 (three) times daily.   05/22/2018 at Unknown time  . pantoprazole (PROTONIX) 40 MG tablet TAKE 1 TABLET (40 MG TOTAL) BY MOUTH 2 (TWO) TIMES DAILY. TAKE ONE HOUR BEFORE A MEAL   05/22/2018 at Unknown time  . potassium chloride (K-DUR,KLOR-CON) 10 MEQ tablet TAKE 1 TABLET BY MOUTH ONCE A DAY   05/22/2018 at Unknown time  . pramipexole (MIRAPEX) 0.5 MG tablet TAKE 2 TABLETS BY MOUTH AT BEDTIME   05/22/2018 at Unknown time  . tiZANidine (ZANAFLEX) 4 MG tablet Take 1 tablet (4 mg total) by mouth 2  (two) times daily as needed for muscle spasms. 60 tablet 3 Past Month at Unknown time  . traZODone (DESYREL) 150 MG tablet Take 150 mg by mouth at bedtime.    05/22/2018 at Unknown time  . zolpidem (AMBIEN) 10 MG tablet Take 10 mg by mouth at bedtime.    05/22/2018 at Unknown time  . albuterol (VENTOLIN HFA) 108 (90 Base) MCG/ACT inhaler TAKE 2 PUFFS BY MOUTH 3 TIMES A DAY AS NEEDED FOR SHORTNESS OF BREATH   Taking  . doxycycline (VIBRA-TABS) 100 MG tablet Take 1 tablet (100 mg total) by mouth 2 (two) times daily. (Patient not taking: Reported on 05/23/2018) 28 tablet 0 Not Taking at Unknown time  . gabapentin (NEURONTIN) 600 MG tablet Take 1 tablet (600 mg total) by mouth 6 (six) times daily. (Patient taking differently: Take 600 mg by mouth See admin instructions. ) 540 tablet 0 Taking  . lidocaine (LIDODERM) 5 % PLACE 1 PATCH ONTO THE MOST PAINFUL AREA OF SKIN DAILY FOR UP TO 12 HOURS IN A 24 HOUR PERIOD  5 Not Taking at Unknown time  . oxyCODONE-acetaminophen (PERCOCET) 10-325 MG tablet Take 1 tablet by mouth daily. (Patient not taking: Reported on 05/19/2018) 30 tablet 0 Completed Course at Unknown time  . traMADol (ULTRAM) 50 MG tablet Take 2 tablets (100 mg total) by mouth every 8 (eight) hours. 180 tablet 2 Taking     Allergies  Allergen Reactions  . Eggs Or Egg-Derived Products Nausea And Vomiting    Only has reaction to flu vaccine due to eggs, no reaction to egg food products  . Shellfish Allergy Nausea And Vomiting, Diarrhea and Nausea Only    Non stop sickness once ingests shellfish. Betadine is okay  . Amlodipine Cough  . Codeine Nausea And Vomiting and Nausea Only    states she can take codeine cough syrup without problems states she can take codeine cough syrup without problems  . Imdur [Isosorbide Dinitrate] Other (See Comments)    Headache  . Lyrica [Pregabalin] Swelling and Other (See Comments)    "FLU-LIKE" SYMPTOMS  . Monosodium Glutamate Other (See Comments)    Headache   . Tape Other (See Comments) and Nausea And Vomiting    can use paper tape Redness     Past Medical History:  Diagnosis Date  . Abdominal pain, left upper quadrant 12/11/2012  . Arthritis   . Asthma   . Broken leg 2014   fell twice and broke same leg. had a bone stimulator for first break.  . Cancer ([place]) 2007   melanoma, left ear and back of left leg, removed from back  . Chronic kidney disease, stage 2 (mild)   . Chronic lower back pain   .  Collagen vascular disease ([place])   . Coronary artery disease   . Diabetes mellitus without complication ([place])   . Diabetic nephropathy associated with secondary diabetes mellitus ([place])   . Flushing 12/11/2012  . Gastroparesis   . Gross hematuria 12/11/2012  . Hepatic cirrhosis ([place])   . Hyperlipidemia   . Hypertension   . Hypothyroidism   . IBS (irritable bowel syndrome)   . Kidney stone 12/11/2012  . Lower extremity edema   . Morbid obesity with BMI of 40.0-44.9, adult ([place])   . Myocardial infarction ([place]) 09/2013  . Nausea without vomiting 12/11/2012  . Nephrolithiasis   . Nephrolithiasis 04/26/2014  . Nonproliferative retinopathy due to secondary diabetes ([place])   . Numbness and tingling of right leg   . Peripheral vascular disease ([place])   . Renal colic 94/17/4081  . Sciatica   . Sciatica 12/11/2012  . Skin cancer   . Sleep apnea    sleep study coming in november  . Unilateral small kidney without contralateral hypertrophy     Review of systems:      Physical Exam    Heart and lungs: Regular rate and rhythm without rub or gallop, lungs are bilaterally clear.    HEENT: Normocephalic atraumatic eyes are anicteric    Other:    Pertinant exam for procedure: Soft obese, non-tender mild discomfort to palpation the epigastric region.  There are no masses or rebound.    Planned proceedures: EGD and indicated procedures. I have discussed the risks benefits and complications of procedures to include not limited to bleeding,  infection, perforation and the risk of sedation and the patient wishes to proceed.    Lollie Sails, MD Gastroenterology 05/23/2018  11:01 AM

## 2018-05-23 NOTE — Transfer of Care (Signed)
Immediate Anesthesia Transfer of Care Note  Patient: Brittney [name]  Procedure(s) Performed: ESOPHAGOGASTRODUODENOSCOPY (EGD) WITH PROPOFOL (N/A )  Patient Location: PACU  Anesthesia Type:General  Level of Consciousness: awake, alert  and oriented  Airway & Oxygen Therapy: Patient Spontanous Breathing  Post-op Assessment: Report given to RN and Post -op Vital signs reviewed and stable  Post vital signs: Reviewed and stable  Last Vitals:  Vitals Value Taken Time  BP    Temp    Pulse 78 05/23/2018 12:15 PM  Resp 14 05/23/2018 12:15 PM  SpO2 93 % 05/23/2018 12:15 PM  Vitals shown include unvalidated device data.  Last Pain: There were no vitals filed for this visit.       Complications: No apparent anesthesia complications

## 2018-05-23 NOTE — Op Note (Signed)
[facility] Gastroenterology Patient Name: Brittney [name] Procedure Date: 05/23/2018 11:39 AM MRN: [id number] Account #: [id number] Date of Birth: 12/18/1951 Admit Type: Outpatient Age: 67 Room: [facility] ENDO ROOM 2 Gender: Female Note Status: Finalized Procedure:            Upper GI endoscopy Indications:          Dyspepsia, Cirrhosis rule out esophageal varices Providers:            Lollie Sails, MD Referring MD:         Rusty Aus, MD (Referring MD) Medicines:            Monitored Anesthesia Care Complications:        No immediate complications. Procedure:            Pre-Anesthesia Assessment:                       - ASA Grade Assessment: III - A patient with severe                        systemic disease.                       After obtaining informed consent, the endoscope was                        passed under direct vision. Throughout the procedure,                        the patient's blood pressure, pulse, and oxygen                        saturations were monitored continuously. The Endoscope                        was introduced through the mouth, and advanced to the                        third part of duodenum. The upper GI endoscopy was                        accomplished without difficulty. The patient tolerated                        the procedure. Findings:      The Z-line was variable.      There is the appearance of several venous lakes in the mid to upper       distal esophagus, however these are not contiguous, and do not have the       appearance of varices near the GE junction. 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      Diffuse mild inflammation characterized by congestion (edema), erythema       and friability was found in the gastric body and in the gastric antrum.       Biopsies were taken with a cold forceps  for histology. Biopsies were       taken with a cold forceps for Helicobacter pylori testing.      The cardia  and gastric fundus were normal on retroflexion otherwise.      The examined duodenum was normal.      A small hiatal hernia was found. The Z-line was a variable distance from       incisors; the hiatal hernia was sliding. Impression:           - Z-line variable.                       - Bile gastritis. Biopsied.                       - Normal examined duodenum. Recommendation:       - Full liquids for lunch today, soft diet for supper                        today, may advance as tolerated to usual diet (low                        residue) tomorrow.                       - Continue present medications.                       - Return to GI clinic as previously scheduled. Procedure Code(s):    --- Professional ---                       [id number], Esophagogastroduodenoscopy, flexible, transoral;                        with biopsy, single or multiple Diagnosis Code(s):    --- Professional ---                       K22.8, Other specified diseases of esophagus                       K29.60, Other gastritis without bleeding                       R10.13, Epigastric pain                       K74.60, Unspecified cirrhosis of liver CPT copyright 2017 [employer]. All rights reserved. The codes documented in this report are preliminary and upon coder review may  be revised to meet current compliance requirements. Lollie Sails, MD 05/23/2018 12:18:02 PM This report has been signed electronically. Number of Addenda: 0 Note Initiated On: 05/23/2018 11:39 AM      [facility]

## 2018-05-23 NOTE — Anesthesia Postprocedure Evaluation (Signed)
Anesthesia Post Note  Patient: Brittney [name]  Procedure(s) Performed: ESOPHAGOGASTRODUODENOSCOPY (EGD) WITH PROPOFOL (N/A )  Patient location during evaluation: Endoscopy Anesthesia Type: General Level of consciousness: awake and alert Pain management: pain level controlled Vital Signs Assessment: post-procedure vital signs reviewed and stable Respiratory status: spontaneous breathing, nonlabored ventilation, respiratory function stable and patient connected to nasal cannula oxygen Cardiovascular status: blood pressure returned to baseline and stable Postop Assessment: no apparent nausea or vomiting Anesthetic complications: no     Last Vitals:  Vitals:   05/23/18 1225 05/23/18 1235  BP: (!) 183/82 (!) 187/80  Pulse: 77 76  Resp: (!) 22 19  SpO2: 93% 92%    Last Pain:  Vitals:   05/23/18 1235  PainSc: 0-No pain                 Saysha Menta S

## 2018-05-23 NOTE — Anesthesia Post-op Follow-up Note (Signed)
Anesthesia QCDR form completed.        

## 2018-05-24 ENCOUNTER — Inpatient Hospital Stay (HOSPITAL_BASED_OUTPATIENT_CLINIC_OR_DEPARTMENT_OTHER): Payer: Medicare Other | Admitting: Oncology

## 2018-05-24 ENCOUNTER — Encounter: Payer: Self-pay | Admitting: Gastroenterology

## 2018-05-24 ENCOUNTER — Inpatient Hospital Stay: Payer: Medicare Other

## 2018-05-24 ENCOUNTER — Other Ambulatory Visit: Payer: Self-pay

## 2018-05-24 ENCOUNTER — Inpatient Hospital Stay: Payer: Medicare Other | Attending: Oncology

## 2018-05-24 VITALS — BP 183/83 | HR 68 | Temp 97.2°F | Resp 18 | Wt 293.7 lb

## 2018-05-24 DIAGNOSIS — N189 Chronic kidney disease, unspecified: Secondary | ICD-10-CM | POA: Diagnosis not present

## 2018-05-24 DIAGNOSIS — I10 Essential (primary) hypertension: Secondary | ICD-10-CM | POA: Diagnosis not present

## 2018-05-24 DIAGNOSIS — Z9484 Stem cells transplant status: Secondary | ICD-10-CM | POA: Diagnosis not present

## 2018-05-24 DIAGNOSIS — G4700 Insomnia, unspecified: Secondary | ICD-10-CM | POA: Diagnosis not present

## 2018-05-24 DIAGNOSIS — D509 Iron deficiency anemia, unspecified: Secondary | ICD-10-CM | POA: Diagnosis present

## 2018-05-24 DIAGNOSIS — M549 Dorsalgia, unspecified: Secondary | ICD-10-CM | POA: Insufficient documentation

## 2018-05-24 DIAGNOSIS — D649 Anemia, unspecified: Secondary | ICD-10-CM

## 2018-05-24 DIAGNOSIS — T451X5A Adverse effect of antineoplastic and immunosuppressive drugs, initial encounter: Secondary | ICD-10-CM | POA: Diagnosis not present

## 2018-05-24 DIAGNOSIS — C9001 Multiple myeloma in remission: Secondary | ICD-10-CM | POA: Diagnosis not present

## 2018-05-24 DIAGNOSIS — G620 Drug-induced polyneuropathy: Secondary | ICD-10-CM | POA: Diagnosis not present

## 2018-05-24 LAB — SURGICAL PATHOLOGY

## 2018-05-24 LAB — CBC WITH DIFFERENTIAL/PLATELET
BASOS ABS: 0.1 10*3/uL (ref 0–0.1)
BASOS PCT: 1 %
Eosinophils Absolute: 0.3 10*3/uL (ref 0–0.7)
Eosinophils Relative: 3 %
HEMATOCRIT: 36.5 % (ref 35.0–47.0)
Hemoglobin: 12.3 g/dL (ref 12.0–16.0)
LYMPHS PCT: 27 %
Lymphs Abs: 2.5 10*3/uL (ref 1.0–3.6)
MCH: 29 pg (ref 26.0–34.0)
MCHC: 33.6 g/dL (ref 32.0–36.0)
MCV: 86.4 fL (ref 80.0–100.0)
Monocytes Absolute: 0.5 10*3/uL (ref 0.2–0.9)
Monocytes Relative: 6 %
NEUTROS ABS: 5.7 10*3/uL (ref 1.4–6.5)
NEUTROS PCT: 63 %
Platelets: 172 10*3/uL (ref 150–440)
RBC: 4.22 MIL/uL (ref 3.80–5.20)
RDW: 14.6 % — ABNORMAL HIGH (ref 11.5–14.5)
WBC: 9.1 10*3/uL (ref 3.6–11.0)

## 2018-05-24 LAB — IRON AND TIBC
Iron: 60 ug/dL (ref 28–170)
SATURATION RATIOS: 21 % (ref 10.4–31.8)
TIBC: 284 ug/dL (ref 250–450)
UIBC: 224 ug/dL

## 2018-05-24 LAB — FERRITIN: FERRITIN: 64 ng/mL (ref 11–307)

## 2018-05-24 NOTE — Progress Notes (Signed)
Here for follow up. Stated overall doing " better / feeling stronger " having spinal cord stimulator re programed tomorrow.

## 2018-05-25 ENCOUNTER — Encounter: Payer: Self-pay | Admitting: Student in an Organized Health Care Education/Training Program

## 2018-05-25 ENCOUNTER — Ambulatory Visit
Payer: Medicare Other | Attending: Student in an Organized Health Care Education/Training Program | Admitting: Student in an Organized Health Care Education/Training Program

## 2018-05-25 ENCOUNTER — Encounter: Payer: Medicare Other | Admitting: Student in an Organized Health Care Education/Training Program

## 2018-05-25 ENCOUNTER — Other Ambulatory Visit: Payer: Self-pay

## 2018-05-25 VITALS — BP 158/65 | HR 69 | Temp 98.0°F | Resp 18 | Ht 66.0 in | Wt 285.0 lb

## 2018-05-25 DIAGNOSIS — M1711 Unilateral primary osteoarthritis, right knee: Secondary | ICD-10-CM | POA: Insufficient documentation

## 2018-05-25 DIAGNOSIS — N182 Chronic kidney disease, stage 2 (mild): Secondary | ICD-10-CM | POA: Insufficient documentation

## 2018-05-25 DIAGNOSIS — E785 Hyperlipidemia, unspecified: Secondary | ICD-10-CM | POA: Diagnosis not present

## 2018-05-25 DIAGNOSIS — E11319 Type 2 diabetes mellitus with unspecified diabetic retinopathy without macular edema: Secondary | ICD-10-CM | POA: Insufficient documentation

## 2018-05-25 DIAGNOSIS — E1122 Type 2 diabetes mellitus with diabetic chronic kidney disease: Secondary | ICD-10-CM | POA: Insufficient documentation

## 2018-05-25 DIAGNOSIS — K7460 Unspecified cirrhosis of liver: Secondary | ICD-10-CM | POA: Diagnosis not present

## 2018-05-25 DIAGNOSIS — M961 Postlaminectomy syndrome, not elsewhere classified: Secondary | ICD-10-CM | POA: Diagnosis not present

## 2018-05-25 DIAGNOSIS — M47816 Spondylosis without myelopathy or radiculopathy, lumbar region: Secondary | ICD-10-CM

## 2018-05-25 DIAGNOSIS — M79605 Pain in left leg: Secondary | ICD-10-CM | POA: Diagnosis not present

## 2018-05-25 DIAGNOSIS — M533 Sacrococcygeal disorders, not elsewhere classified: Secondary | ICD-10-CM | POA: Insufficient documentation

## 2018-05-25 DIAGNOSIS — M792 Neuralgia and neuritis, unspecified: Secondary | ICD-10-CM

## 2018-05-25 DIAGNOSIS — M5416 Radiculopathy, lumbar region: Secondary | ICD-10-CM | POA: Diagnosis not present

## 2018-05-25 DIAGNOSIS — G894 Chronic pain syndrome: Secondary | ICD-10-CM | POA: Diagnosis not present

## 2018-05-25 DIAGNOSIS — Z794 Long term (current) use of insulin: Secondary | ICD-10-CM | POA: Insufficient documentation

## 2018-05-25 DIAGNOSIS — M797 Fibromyalgia: Secondary | ICD-10-CM

## 2018-05-25 DIAGNOSIS — D509 Iron deficiency anemia, unspecified: Secondary | ICD-10-CM | POA: Diagnosis not present

## 2018-05-25 DIAGNOSIS — M79604 Pain in right leg: Secondary | ICD-10-CM

## 2018-05-25 DIAGNOSIS — G8929 Other chronic pain: Secondary | ICD-10-CM

## 2018-05-25 DIAGNOSIS — Z981 Arthrodesis status: Secondary | ICD-10-CM | POA: Insufficient documentation

## 2018-05-25 DIAGNOSIS — E1143 Type 2 diabetes mellitus with diabetic autonomic (poly)neuropathy: Secondary | ICD-10-CM | POA: Diagnosis not present

## 2018-05-25 DIAGNOSIS — G4733 Obstructive sleep apnea (adult) (pediatric): Secondary | ICD-10-CM | POA: Diagnosis not present

## 2018-05-25 DIAGNOSIS — Z9889 Other specified postprocedural states: Secondary | ICD-10-CM

## 2018-05-25 DIAGNOSIS — J45909 Unspecified asthma, uncomplicated: Secondary | ICD-10-CM | POA: Insufficient documentation

## 2018-05-25 DIAGNOSIS — Z7951 Long term (current) use of inhaled steroids: Secondary | ICD-10-CM | POA: Diagnosis not present

## 2018-05-25 DIAGNOSIS — K589 Irritable bowel syndrome without diarrhea: Secondary | ICD-10-CM | POA: Insufficient documentation

## 2018-05-25 DIAGNOSIS — Z79899 Other long term (current) drug therapy: Secondary | ICD-10-CM | POA: Diagnosis not present

## 2018-05-25 DIAGNOSIS — E1121 Type 2 diabetes mellitus with diabetic nephropathy: Secondary | ICD-10-CM | POA: Insufficient documentation

## 2018-05-25 DIAGNOSIS — E559 Vitamin D deficiency, unspecified: Secondary | ICD-10-CM | POA: Insufficient documentation

## 2018-05-25 DIAGNOSIS — Z6841 Body Mass Index (BMI) 40.0 and over, adult: Secondary | ICD-10-CM | POA: Diagnosis not present

## 2018-05-25 DIAGNOSIS — E039 Hypothyroidism, unspecified: Secondary | ICD-10-CM | POA: Insufficient documentation

## 2018-05-25 DIAGNOSIS — M48061 Spinal stenosis, lumbar region without neurogenic claudication: Secondary | ICD-10-CM | POA: Diagnosis not present

## 2018-05-25 DIAGNOSIS — M4310 Spondylolisthesis, site unspecified: Secondary | ICD-10-CM | POA: Insufficient documentation

## 2018-05-25 DIAGNOSIS — Z85828 Personal history of other malignant neoplasm of skin: Secondary | ICD-10-CM | POA: Insufficient documentation

## 2018-05-25 DIAGNOSIS — K3184 Gastroparesis: Secondary | ICD-10-CM | POA: Insufficient documentation

## 2018-05-25 DIAGNOSIS — Z87442 Personal history of urinary calculi: Secondary | ICD-10-CM | POA: Insufficient documentation

## 2018-05-25 DIAGNOSIS — M7918 Myalgia, other site: Secondary | ICD-10-CM | POA: Diagnosis not present

## 2018-05-25 NOTE — Progress Notes (Signed)
Nursing Pain Medication Assessment:  Safety precautions to be maintained throughout the outpatient stay will include: orient to surroundings, keep bed in low position, maintain call bell within reach at all times, provide assistance with transfer out of bed and ambulation.  Medication Inspection Compliance: Pill count conducted under aseptic conditions, in front of the patient. Neither the pills nor the bottle was removed from the patient's sight at any time. Once count was completed pills were immediately returned to the patient in their original bottle.  Medication #1: Oxycodone/APAP Pill/Patch Count: 28 of 30 pills remain Pill/Patch Appearance: Markings consistent with prescribed medication Bottle Appearance: Standard pharmacy container. Clearly labeled. Filled Date:04 / 09 / 2019 Last Medication intake:  3-4 weeks ago  Medication #2: Tramadol (Ultram) Pill/Patch Count: 144 of 180 pills remain Pill/Patch Appearance: Markings consistent with prescribed medication Bottle Appearance: Standard pharmacy container. Clearly labeled. Filled Date: 05 /16/ 2019 Last Medication intake:  Today

## 2018-05-25 NOTE — Patient Instructions (Signed)
Follow up as needed

## 2018-05-25 NOTE — Progress Notes (Signed)
Patient's Name: Brittney [name]  MRN: [id number]  Referring Provider: Rusty Aus, MD  DOB: [birth year]  PCP: Rusty Aus, MD  DOS: 05/25/2018  Note by: [name], MD  Service setting: Ambulatory outpatient  Specialty: Interventional Pain Management  Location: [facility] (AMB) Pain Management Facility    Patient type: Established   Primary Reason(s) for Visit: Encounter for prescription drug management. (Level of risk: moderate)  CC: Back Pain and Leg Pain  HPI  Brittney [name] is a 67 y.o. year old, female patient, who comes today for a follow-up evaluation. She has Abnormal tumor markers; Chronic kidney disease, stage II (mild); Degenerative spondylolisthesis; Diabetes mellitus with peripheral vascular disease ([place]); Diabetes with retinopathy ([place]); Diabetic nephropathy ([place]); DM (diabetes mellitus) type II uncontrolled, periph vascular disorder ([place]); Fibromyalgia; Gastroparesis due to DM ([place]); Generalized osteoarthritis of multiple sites; Hepatic cirrhosis ([place]); Hyperlipemia; Lung nodule, multiple; Microalbuminuria; OSA (obstructive sleep apnea); Seronegative arthritis; Severe uncontrolled hypertension; L3-4 severe lumbar spinal stenosis (12/31/2016 MRI); Type 2 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, with long-term current use of insulin ([place]); Unilateral small kidney; Long term current use of opiate analgesic; Long term prescription opiate use; Opiate use; Chronic pain syndrome; Morbid obesity with BMI of 40.0-44.9, adult ([place]); Type 2 diabetes mellitus with diabetic nephropathy, with long-term current use of insulin ([place]); Chronic low back pain (Location of Primary Source of Pain) (Bilateral) (R>L); 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); Chronic lower extremity pain (Location of Secondary source of pain) (Bilateral) (R>L); Chronic knee pain (Location of Tertiary source of pain) (Right); Osteoarthritis of knee (Right); Grade 1  Anterolisthesis of L3 over L4 and L4 over L5; Osteoarthritis of sacroiliac joint (Right); L3-4 severe lumbar facet hypertrophy and spinal stenosis; Neurogenic pain; Long term prescription benzodiazepine use; Vitamin D insufficiency; Iron deficiency anemia; Back pain; Status post lumbar spine surgery for decompression of spinal cord; Myofascial pain; and Spinal stenosis of thoracic region on their problem list. Brittney [name] was last seen on 03/30/2018. Her primarily concern today is the Back Pain and Leg Pain  Brittney [name] last encounter's weight was 285 lb (129.3 kg), with a BMI of 46.00 kg/m. We have pointed out to the patient that as long as her BMI remains above 30, this will adversely affect her pain.  Estimated body mass index is 46 kg/m as calculated from the following:   Height as of this encounter: _0  (1.676 m).   Weight as of this encounter: 285 lb (129.3 kg).  Her ideal body weight should be: Ideal body weight: 59.3 kg (130 lb 11.7 oz) Adjusted ideal body weight: 87.3 kg (192 lb 7 oz)  Pain Assessment: Location: Lower Back Radiating: denies Onset: More than a month ago Duration: Chronic pain Quality: Aching, Throbbing Severity: 3 /10 (subjective, self-reported pain score)  Note: Reported level is compatible with observation.                         When using our objective Pain Scale, levels between 6 and 10/10 are said to belong in an emergency room, as it progressively worsens from a 6/10, described as severely limiting, requiring emergency care not usually available at an outpatient pain management facility. At a 6/10 level, communication becomes difficult and requires great effort. Assistance to reach the emergency department may be required. Facial flushing and profuse sweating along with potentially dangerous increases in heart rate and blood pressure will be evident. Effect on  ADL:   Timing: Constant Modifying factors: resting BP: (!) 158/65  HR: 69  Further details on  both, my assessment(s), as well as the proposed treatment plan, please see below.  Patient presents today for follow-up.  She seems to be doing very well.  Patient is participating in aquatic therapy and is going 2 times per week.  She states that this has helped with her mood, outlook, depression, energy levels.  Patient is also noting ongoing benefit of her spinal cord stimulator.  She has only taken 2 Percocet tablets that I prescribed to her which were filled on 04/04/2018.  Her primary care physician is prescribing her tramadol, Xanax, Ambien for her pain, anxiety, insomnia. She is continuing to see Dr Olive Bass for CBT which she finds very helpful.  Controlled Substance Pharmacotherapy Assessment REMS (Risk Evaluation and Mitigation Strategy)  Analgesic: Percocet 10 mg daily as needed severe pain (has only used 2 tablets since her fill on 04/04/2018) MME/day: Less than 15 mg/day.  Dewayne Shorter, RN  05/25/2018 10:25 AM  Signed Nursing Pain Medication Assessment:  Safety precautions to be maintained throughout the outpatient stay will include: orient to surroundings, keep bed in low position, maintain call bell within reach at all times, provide assistance with transfer out of bed and ambulation.  Medication Inspection Compliance: Pill count conducted under aseptic conditions, in front of the patient. Neither the pills nor the bottle was removed from the patient's sight at any time. Once count was completed pills were immediately returned to the patient in their original bottle.  Medication #1: Oxycodone/APAP Pill/Patch Count: 28 of 30 pills remain Pill/Patch Appearance: Markings consistent with prescribed medication Bottle Appearance: Standard pharmacy container. Clearly labeled. Filled Date:04 / 09 / 2019 Last Medication intake:  3-4 weeks ago  Medication #2: Tramadol (Ultram) Pill/Patch Count: 144 of 180 pills remain Pill/Patch Appearance: Markings consistent with prescribed medication Bottle  Appearance: Standard pharmacy container. Clearly labeled. Filled Date: 05 /16/ 2019 Last Medication intake:  Today   Pharmacokinetics: Liberation and absorption (onset of action): WNL Distribution (time to peak effect): WNL Metabolism and excretion (duration of action): WNL         Pharmacodynamics: Desired effects: Analgesia: Brittney [name] reports >50% benefit. Functional ability: Patient reports that medication allows her to accomplish basic ADLs Clinically meaningful improvement in function (CMIF): Sustained CMIF goals met Perceived effectiveness: Described as relatively effective, allowing for increase in activities of daily living (ADL) Undesirable effects: Side-effects or Adverse reactions: None reported Monitoring: [place] PMP: Online review of the past 69-monthperiod conducted. Compliant with practice rules and regulations Last UDS on record: Summary  Date Value Ref Range Status  12/07/2016 FINAL  Final    Comment:    ==================================================================== TOXASSURE COMP DRUG ANALYSIS,UR ==================================================================== Test                             Result       Flag       Units Drug Present and Declared for Prescription Verification   Alprazolam                     350          EXPECTED   ng/mg creat   Alpha-hydroxyalprazolam        1573         EXPECTED   ng/mg creat    Source of alprazolam is a scheduled prescription medication.  Alpha-hydroxyalprazolam is an expected metabolite of alprazolam.   Butalbital                     PRESENT      EXPECTED   Tramadol                       PRESENT      EXPECTED   O-Desmethyltramadol            PRESENT      EXPECTED   N-Desmethyltramadol            PRESENT      EXPECTED    Source of tramadol is a prescription medication.    O-desmethyltramadol and N-desmethyltramadol are expected    metabolites of tramadol.   Gabapentin                     PRESENT      EXPECTED    Cyclobenzaprine                PRESENT      EXPECTED   Citalopram                     PRESENT      EXPECTED   Desmethylcitalopram            PRESENT      EXPECTED    Desmethylcitalopram is an expected metabolite of citalopram or    the enantiomeric form, escitalopram.   Trazodone                      PRESENT      EXPECTED   1,3 chlorophenyl piperazine    PRESENT      EXPECTED    1,3-chlorophenyl piperazine is an expected metabolite of    trazodone.   Acetaminophen                  PRESENT      EXPECTED   Ibuprofen                      PRESENT      EXPECTED   Atenolol                       PRESENT      EXPECTED Drug Present not Declared for Prescription Verification   Lorazepam                      602          UNEXPECTED ng/mg creat    Source of lorazepam is a scheduled prescription medication. Drug Absent but Declared for Prescription Verification   Zolpidem                       Not Detected UNEXPECTED    Zolpidem, as indicated in the declared medication list, is not    always detected even when used as directed.   Diclofenac                     Not Detected UNEXPECTED    Diclofenac, as indicated in the declared medication list, is not    always detected even when used as directed.   Lidocaine                      Not Detected UNEXPECTED  Lidocaine, as indicated in the declared medication list, is not    always detected even when used as directed.   Clonidine                      Not Detected UNEXPECTED ==================================================================== Test                      Result    Flag   Units      Ref Range   Creatinine              101              mg/dL      >=20 ==================================================================== Declared Medications:  The flagging and interpretation on this report are based on the  following declared medications.  Unexpected results may arise from  inaccuracies in the declared medications.  **Note: The testing  scope of this panel includes these medications:  Alprazolam (Xanax)  Atenolol  Butalbital (Fioricet)  Citalopram (Lexapro)  Clonidine  Cyclobenzaprine (Flexeril)  Gabapentin  Tramadol (Ultram)  Trazodone (Desyrel)  **Note: The testing scope of this panel does not include small to  moderate amounts of these reported medications:  Acetaminophen (Fioricet)  Diclofenac (Voltaren)  Ibuprofen  Lidocaine (Lidoderm)  Zolpidem (Ambien)  **Note: The testing scope of this panel does not include following  reported medications:  Albuterol  Doxazosin (Cardura)  Eye Drops  Fluticasone (Flonase)  Hydralazine (Apresoline)  Hydrochlorothiazide  Insulin (Humulin)  Insulin (Levemir)  Levofloxacin (Levaquin)  Linaclotide (Linzess)  Lisinopril  Metformin  Metoclopramide (Reglan)  Ondansetron (Zofran)  Pantoprazole (Protonix)  Potassium  Pramipexole (Mirapex)  Torsemide (Demadex) ==================================================================== For clinical consultation, please call [phone]. ====================================================================    UDS interpretation: Compliant          Medication Assessment Form: Reviewed. Patient indicates being compliant with therapy Treatment compliance: Compliant Risk Assessment Profile: Aberrant behavior: See prior evaluations. None observed or detected today Comorbid factors increasing risk of overdose: See prior notes. No additional risks detected today Risk of substance use disorder (SUD): Low Opioid Risk Tool - 05/25/18 1021      Family History of Substance Abuse   Alcohol  Negative    Illegal Drugs  Negative    Rx Drugs  Negative      Personal History of Substance Abuse   Alcohol  Negative    Illegal Drugs  Negative    Rx Drugs  Negative      Age   Age between 53-45 years   No      History of Preadolescent Sexual Abuse   History of Preadolescent Sexual Abuse  Negative or Female      Psychological Disease    Psychological Disease  Negative    Depression  Negative      Total Score   Opioid Risk Tool Scoring  0    Opioid Risk Interpretation  Low Risk      ORT Scoring interpretation table:  Score <3 = Low Risk for SUD  Score between 4-7 = Moderate Risk for SUD  Score >8 = High Risk for Opioid Abuse   Risk Mitigation Strategies:  Patient Counseling: Covered Patient-Prescriber Agreement (PPA): Present and active  Notification to other healthcare providers: Done  Pharmacologic Plan: No change in therapy, at this time.             Laboratory Chemistry  Inflammation Markers (CRP: Acute Phase) (ESR: Chronic Phase) Lab Results  Component Value Date  CRP 1.2 (H) 12/07/2016   ESRSEDRATE 56 (H) 12/07/2016                         Rheumatology Markers No results found for: RF, ANA, LABURIC, URICUR, LYMEIGGIGMAB, LYMEABIGMQN, HLAB27                      Renal Function Markers Lab Results  Component Value Date   BUN 14 10/28/2017   CREATININE 0.60 10/28/2017   GFRAA >60 10/28/2017   GFRNONAA >60 10/28/2017                              Hepatic Function Markers Lab Results  Component Value Date   AST 29 10/28/2017   ALT 24 10/28/2017   ALBUMIN 3.4 (L) 10/28/2017   ALKPHOS 103 10/28/2017   LIPASE 114 10/22/2014                        Electrolytes Lab Results  Component Value Date   NA 137 10/28/2017   K 3.8 10/28/2017   CL 101 10/28/2017   CALCIUM 9.5 10/28/2017   MG 1.8 12/07/2016                        Neuropathy Markers Lab Results  Component Value Date   VITAMINB12 787 12/07/2016   HGBA1C 9.3 (H) 10/23/2014                        Bone Pathology Markers Lab Results  Component Value Date   25OHVITD1 26 (L) 12/07/2016   25OHVITD2 1.2 12/07/2016   25OHVITD3 25 12/07/2016                         Coagulation Parameters Lab Results  Component Value Date   INR 0.95 05/23/2018   LABPROT 12.6 05/23/2018   APTT 34.9 10/22/2014   PLT 172 05/24/2018                         Cardiovascular Markers Lab Results  Component Value Date   CKTOTAL 161 10/23/2014   CKMB 1.0 10/23/2014   TROPONINI 0.06 (H) 10/23/2014   HGB 12.3 05/24/2018   HCT 36.5 05/24/2018                         CA Markers No results found for: CEA, CA125, LABCA2                      Note: Lab results reviewed.  Recent Diagnostic Imaging Results  US Abdomen Complete CLINICAL DATA:  Cirrhosis  EXAM: ABDOMEN ULTRASOUND COMPLETE  COMPARISON:  None.  FINDINGS: Gallbladder: Surgically removed  Common bile duct: Diameter: 8.5 mm.  Liver: Increased echogenicity is noted with some nodularity consistent with the underlying given clinical history of cirrhosis. No focal mass is seen. Portal vein is patent on color Doppler imaging with normal direction of blood flow towards the liver.  IVC: No abnormality visualized.  Pancreas: Visualized portion unremarkable.  Spleen: Size and appearance within normal limits.  Right Kidney: Length: 10.9 cm. Echogenicity within normal limits. No mass or hydronephrosis visualized.  Left Kidney: Length: 12.0 cm. Echogenicity within normal limits. No mass or hydronephrosis visualized.  Abdominal aorta: Atherosclerotic  changes are noted without focal aneurysm.  Other findings: None.  IMPRESSION: Changes of cirrhosis without focal mass.  Electronically Signed   By: Inez Catalina M.D.   On: 03/27/2018 12:33  Complexity Note: Imaging results reviewed. Results shared with Brittney [name], using Layman's terms.                         Meds   Current Outpatient Medications:  .  albuterol (VENTOLIN HFA) 108 (90 Base) MCG/ACT inhaler, TAKE 2 PUFFS BY MOUTH 3 TIMES A DAY AS NEEDED FOR SHORTNESS OF BREATH, Disp: , Rfl:  .  ALPRAZolam (XANAX) 0.5 MG tablet, Take 0.5 mg by mouth 4 (four) times daily. , Disp: , Rfl:  .  bumetanide (BUMEX) 1 MG tablet, Take by mouth daily. , Disp: , Rfl:  .  butalbital-acetaminophen-caffeine (FIORICET, ESGIC)  50-325-40 MG tablet, TAKE ONE TABLET BY MOUTH ONCE DAILY, Disp: , Rfl:  .  candesartan (ATACAND) 32 MG tablet, Take 1 tablet (32 mg total) by mouth daily., Disp: 30 tablet, Rfl: 6 .  carvedilol (COREG) 25 MG tablet, Take 37.5-50 mg by mouth 2 (two) times daily with a meal. TAKES 2 TABLETS IN THE MORNING AND 1.5 TABLETS IN THE EVENING, Disp: , Rfl:  .  cloNIDine (CATAPRES - DOSED IN MG/24 HR) 0.3 mg/24hr patch, Place 0.3 mg onto the skin once a week. , Disp: , Rfl:  .  cyclobenzaprine (FLEXERIL) 10 MG tablet, Take 20 mg by mouth at bedtime., Disp: , Rfl:  .  diclofenac sodium (VOLTAREN) 1 % GEL, Apply 4 g topically as needed (PAIN). , Disp: , Rfl:  .  doxazosin (CARDURA) 4 MG tablet, Take 4 mg by mouth at bedtime. , Disp: , Rfl:  .  escitalopram (LEXAPRO) 10 MG tablet, Take 10 mg by mouth daily. , Disp: , Rfl:  .  FLOVENT HFA 220 MCG/ACT inhaler, TAKE 2 PUFFS BY MOUTH TWICE A DAY, Disp: 12 Inhaler, Rfl: 2 .  fluticasone (FLONASE) 50 MCG/ACT nasal spray, PLACE 2 SPRAYS INTO BOTH NOSTRILS 2 (TWO) TIMES DAILY AS NEEDED FOR ALLERGIES, Disp: , Rfl:  .  gabapentin (NEURONTIN) 600 MG tablet, Take 1 tablet (600 mg total) by mouth 6 (six) times daily. (Patient taking differently: Take 600 mg by mouth See admin instructions. ), Disp: 540 tablet, Rfl: 0 .  hydrALAZINE (APRESOLINE) 25 MG tablet, Take 25 mg by mouth at bedtime. , Disp: , Rfl:  .  ibuprofen (ADVIL,MOTRIN) 800 MG tablet, TAKE 1 TABLET (800 MG TOTAL) BY MOUTH 3 (THREE) TIMES DAILY AS NEEDED FOR PAIN., Disp: , Rfl: 3 .  Insulin Detemir (LEVEMIR FLEXTOUCH) 100 UNIT/ML Pen, INJECT 90 UNITS SUBCUTANEOUSLY TWICE A DAY, Disp: , Rfl:  .  Insulin Pen Needle (FIFTY50 PEN NEEDLES) 32G X 6 MM MISC, 5 (five) times daily., Disp: , Rfl:  .  insulin regular human CONCENTRATED (HUMULIN R) 500 UNIT/ML kwikpen, Inject 70-75 Units into the skin 3 (three) times daily with meals. PER SLIDING SCALE, Disp: , Rfl:  .  ketoconazole (NIZORAL) 2 % shampoo, APPLY TO AFFECTED  AREAS (LEAVE ON FOR 5 MINUTES) , RINSE WELL, USE 2-3 TIMES PER WEEK., Disp: , Rfl: 11 .  lactulose (CHRONULAC) 10 GM/15ML solution, Take 10 g by mouth daily as needed for mild constipation., Disp: , Rfl:  .  levothyroxine (SYNTHROID, LEVOTHROID) 150 MCG tablet, PLEASE SEE ATTACHED FOR DETAILED DIRECTIONS, Disp: , Rfl: 11 .  lidocaine (LIDODERM) 5 %, PLACE 1 PATCH ONTO THE  MOST PAINFUL AREA OF SKIN DAILY FOR UP TO 12 HOURS IN A 24 HOUR PERIOD, Disp: , Rfl: 5 .  metoCLOPramide (REGLAN) 10 MG tablet, Take 10 mg by mouth 3 (three) times daily., Disp: , Rfl:  .  oxyCODONE-acetaminophen (PERCOCET) 10-325 MG tablet, Take 1 tablet by mouth daily., Disp: 30 tablet, Rfl: 0 .  pantoprazole (PROTONIX) 40 MG tablet, TAKE 1 TABLET (40 MG TOTAL) BY MOUTH 2 (TWO) TIMES DAILY. TAKE ONE HOUR BEFORE A MEAL, Disp: , Rfl:  .  potassium chloride (K-DUR,KLOR-CON) 10 MEQ tablet, TAKE 1 TABLET BY MOUTH ONCE A DAY, Disp: , Rfl:  .  pramipexole (MIRAPEX) 0.5 MG tablet, TAKE 2 TABLETS BY MOUTH AT BEDTIME, Disp: , Rfl:  .  tiZANidine (ZANAFLEX) 4 MG tablet, Take 1 tablet (4 mg total) by mouth 2 (two) times daily as needed for muscle spasms., Disp: 60 tablet, Rfl: 3 .  traMADol (ULTRAM) 50 MG tablet, Take 2 tablets (100 mg total) by mouth every 8 (eight) hours., Disp: 180 tablet, Rfl: 2 .  traZODone (DESYREL) 150 MG tablet, Take 150 mg by mouth at bedtime. , Disp: , Rfl:  .  zolpidem (AMBIEN) 10 MG tablet, Take 10 mg by mouth at bedtime. , Disp: , Rfl:   ROS  Constitutional: Denies any fever or chills Gastrointestinal: No reported hemesis, hematochezia, vomiting, or acute GI distress Musculoskeletal: Denies any acute onset joint swelling, redness, loss of ROM, or weakness Neurological: No reported episodes of acute onset apraxia, aphasia, dysarthria, agnosia, amnesia, paralysis, loss of coordination, or loss of consciousness  Allergies  Brittney [name] is allergic to eggs or egg-derived products; shellfish allergy; amlodipine;  codeine; imdur [isosorbide dinitrate]; lyrica [pregabalin]; monosodium glutamate; and tape.  [place]  Drug: Brittney [name]  reports that she does not use drugs. Alcohol:  reports that she does not drink alcohol. Tobacco:  reports that she has never smoked. She has never used smokeless tobacco. Medical:  has a past medical history of Abdominal pain, left upper quadrant (12/11/2012), Arthritis, Asthma, Broken leg (2014), Cancer ([place]) (2007), Chronic kidney disease, stage 2 (mild), Chronic lower back pain, Collagen vascular disease ([place]), Coronary artery disease, Diabetes mellitus without complication ([place]), Diabetic nephropathy associated with secondary diabetes mellitus ([place]), Flushing (12/11/2012), Gastroparesis, Gross hematuria (12/11/2012), Hepatic cirrhosis ([place]), Hyperlipidemia, Hypertension, Hypothyroidism, Hypothyroidism, IBS (irritable bowel syndrome), Kidney stone (12/11/2012), Lower extremity edema, Morbid obesity with BMI of 40.0-44.9, adult ([place]), Myocardial infarction ([place]) (09/2013), Nausea without vomiting (12/11/2012), Nephrolithiasis, Nephrolithiasis (04/26/2014), Nonproliferative retinopathy due to secondary diabetes ([place]), Numbness and tingling of right leg, Peripheral vascular disease ([place]), Renal colic (61/44/3154), Sciatica, Sciatica (12/11/2012), Skin cancer, Sleep apnea, and Unilateral small kidney without contralateral hypertrophy. Surgical: Brittney [name]  has a past surgical history that includes Cesarean section (1980); Dilation and curettage of uterus; Eye surgery (1986); Abdominal hysterectomy; Tonsillectomy; Esophagogastroduodenoscopy (02/08/2014); Colonoscopy (05/04/2001); Esophagogastroduodenoscopy (egd) with propofol (N/A, 09/20/2016); Colonoscopy with propofol (N/A, 09/20/2016); Mohs surgery (11/2016); Cholecystectomy; Hernia repair; Joint replacement (Left, 09/24/2013); Back surgery (04/1999); Pulse generator implant (N/A, 11/02/2017); and Esophagogastroduodenoscopy (egd) with propofol  (N/A, 05/23/2018). Family: family history includes Asthma in her mother; Cancer in her mother; Diabetes in her father; Heart disease in her mother; Hypertension in her father; Kidney disease in her father.  Constitutional Exam  General appearance: Well nourished, well developed, and well hydrated. In no apparent acute distress Vitals:   05/25/18 1014  BP: (!) 158/65  Pulse: 69  Resp: 18  Temp: 98 F (36.7 C)  SpO2: 96%  Weight: 285  lb (129.3 kg)  Height: _0  (1.676 m)   BMI Assessment: Estimated body mass index is 46 kg/m as calculated from the following:   Height as of this encounter: _1  (1.676 m).   Weight as of this encounter: 285 lb (129.3 kg).  BMI interpretation table: BMI level Category Range association with higher incidence of chronic pain  <18 kg/m2 Underweight   18.5-24.9 kg/m2 Ideal body weight   25-29.9 kg/m2 Overweight Increased incidence by 20%  30-34.9 kg/m2 Obese (Class I) Increased incidence by 68%  35-39.9 kg/m2 Severe obesity (Class II) Increased incidence by 136%  >40 kg/m2 Extreme obesity (Class III) Increased incidence by 254%   Patient's current BMI Ideal Body weight  Body mass index is 46 kg/m. Ideal body weight: 59.3 kg (130 lb 11.7 oz) Adjusted ideal body weight: 87.3 kg (192 lb 7 oz)   BMI Readings from Last 4 Encounters:  05/25/18 46.00 kg/m  05/24/18 47.40 kg/m  04/24/18 47.45 kg/m  03/30/18 47.94 kg/m   Wt Readings from Last 4 Encounters:  05/25/18 285 lb (129.3 kg)  05/24/18 293 lb 11.2 oz (133.2 kg)  04/24/18 294 lb (133.4 kg)  03/30/18 297 lb (134.7 kg)  Psych/Mental status: Alert, oriented x 3 (person, place, & time)       Eyes: PERLA Respiratory: No evidence of acute respiratory distress  Cervical Spine Area Exam  Skin & Axial Inspection: No masses, redness, edema, swelling, or associated skin lesions Alignment: Symmetrical Functional ROM: Unrestricted ROM      Stability: No instability detected Muscle Tone/Strength:  Functionally intact. No obvious neuro-muscular anomalies detected. Sensory (Neurological): Unimpaired Palpation: No palpable anomalies              Upper Extremity (UE) Exam    Side: Right upper extremity  Side: Left upper extremity  Skin & Extremity Inspection: Skin color, temperature, and hair growth are WNL. No peripheral edema or cyanosis. No masses, redness, swelling, asymmetry, or associated skin lesions. No contractures.  Skin & Extremity Inspection: Skin color, temperature, and hair growth are WNL. No peripheral edema or cyanosis. No masses, redness, swelling, asymmetry, or associated skin lesions. No contractures.  Functional ROM: Unrestricted ROM          Functional ROM: Unrestricted ROM          Muscle Tone/Strength: Functionally intact. No obvious neuro-muscular anomalies detected.  Muscle Tone/Strength: Functionally intact. No obvious neuro-muscular anomalies detected.  Sensory (Neurological): Unimpaired          Sensory (Neurological): Unimpaired          Palpation: No palpable anomalies              Palpation: No palpable anomalies              Provocative Test(s):  Phalen's test: deferred Tinel's test: deferred Apley's scratch test (touch opposite shoulder):  Action 1 (Across chest): deferred Action 2 (Overhead): deferred Action 3 (LB reach): deferred   Provocative Test(s):  Phalen's test: deferred Tinel's test: deferred Apley's scratch test (touch opposite shoulder):  Action 1 (Across chest): deferred Action 2 (Overhead): deferred Action 3 (LB reach): deferred    Thoracic Spine Area Exam  Skin & Axial Inspection: No masses, redness, or swelling Alignment: Symmetrical Functional ROM: Unrestricted ROM Stability: No instability detected Muscle Tone/Strength: Functionally intact. No obvious neuro-muscular anomalies detected. Sensory (Neurological): Unimpaired Muscle strength & Tone: No palpable anomalies  Lumbar Spine Area Exam  Skin & Axial Inspection:Well healed  scar from previous spine surgery  detected Alignment:Symmetrical Functional ULA:[id number] ROM Stability:No instability detected Muscle Tone/Strength:Functionally intact. No obvious neuro-muscular anomalies detected. Sensory (Neurological):Dermatomal pain pattern Palpation:Complains of area being tender to palpationBilateral Fist Percussion Test Provocative Tests: Lumbar Hyperextension and rotation test:Positivebilaterally for facet joint pain.R>L Lumbar Lateral bending test:Positivedue to fusion restriction. Patrick's Maneuver:evaluation deferred today  Gait & Posture Assessment  Ambulation:Patient ambulates using a walker Gait:Limited. Using assistive device to ambulate Posture:Difficulty standing up straight, due to pain  Lower Extremity Exam    Side:Right lower extremity  Side:Left lower extremity  Skin & Extremity Inspection:Skin color, temperature, and hair growth are WNL. No peripheral edema or cyanosis. No masses, redness, swelling, asymmetry, or associated skin lesions. No contractures.  Skin & Extremity Inspection:Skin color, temperature, and hair growth are WNL. No peripheral edema or cyanosis. No masses, redness, swelling, asymmetry, or associated skin lesions. No contractures.  Functional OZY:YQMGNOIBB ROM  Functional CWU:GQBVQXIHW ROM  Muscle Tone/Strength:Functionally intact. No obvious neuro-muscular anomalies detected.  Muscle Tone/Strength:Functionally intact. No obvious neuro-muscular anomalies detected.  Sensory (Neurological):Paresthesia (Tingling sensation)  Sensory (Neurological):Paresthesia (Burning sensation)  Palpation:No palpable anomalies  Palpation:No palpable anomalies  4 out of 5 strength right plantarflexion, dorsiflexion, hip flexion, knee extension.  Assessment  Primary Diagnosis & Pertinent Problem List: The primary encounter diagnosis was Failed back surgical syndrome. Diagnoses  of Status post lumbar spine surgery for decompression of spinal cord, Myofascial pain, Chronic lower extremity pain (Location of Secondary source of pain) (Bilateral) (R>L), L3-4 severe lumbar facet hypertrophy and spinal stenosis, Neurogenic pain, Fibromyalgia, L3-4 severe lumbar spinal stenosis (12/31/2016 MRI), and Chronic pain syndrome were also pertinent to this visit.  Status Diagnosis  Controlled Controlled Controlled 1. Failed back surgical syndrome   2. Status post lumbar spine surgery for decompression of spinal cord   3. Myofascial pain   4. Chronic lower extremity pain (Location of Secondary source of pain) (Bilateral) (R>L)   5. L3-4 severe lumbar facet hypertrophy and spinal stenosis   6. Neurogenic pain   7. Fibromyalgia   8. L3-4 severe lumbar spinal stenosis (12/31/2016 MRI)   9. Chronic pain syndrome      General Recommendations: The pain condition that the patient suffers from is best treated with a multidisciplinary approach that involves an increase in physical activity to prevent de-conditioning and worsening of the pain cycle, as well as psychological counseling (formal and/or informal) to address the co-morbid psychological affects of pain. Treatment will often involve judicious use of pain medications and interventional procedures to decrease the pain, allowing the patient to participate in the physical activity that will ultimately produce long-lasting pain reductions. The goal of the multidisciplinary approach is to return the patient to a higher level of overall function and to restore their ability to perform activities of daily living.   67 year old female status post lumbar spine fusion with chronic neuropathic axial low back and right leg radicular painstatus postNEVROperformed on spinal cord stimulator paddle implant performed by Dr. Cari Caraway on December 01, 2017. Patient presents today for follow-up.  She seems to be doing very well.  Patient is participating  in aquatic therapy and is going 2 times per week.  She states that this has helped with her mood, outlook, depression, energy levels.  Patient is also noting ongoing benefit of her spinal cord stimulator.  She has only taken 2 Percocet tablets that I prescribed to her which were filled on 04/04/2018.  Her primary care physician is prescribing her tramadol, Xanax, Ambien for her pain, anxiety, insomnia. She is continuing to see  Dr Olive Bass for CBT which she finds very helpful.  The patient to continue her psychotherapy sessions with Dr. Olive Bass as well as the aquatic therapy that she has been doing.  I commended her on her approximate 15 pound weight loss.  Spinal cord stimulator representative was here today to to optimize programming.  Patient can continue to follow-up with Dr. Sabra Heck, her PCP for management of tramadol.  Patient can see me as needed if she has worsening low back or right leg pain, wants to repeat trigger point injections, wants to discuss spinal cord stimulator programming.   Provider-requested follow-up: Return if symptoms worsen or fail to improve. Time Note: Greater than 50% of the 15 minute(s) of face-to-face time spent with Brittney [name], was spent in counseling/coordination of care regarding: Brittney [name] primary cause of pain, the treatment plan, treatment alternatives, the opioid analgesic risks and possible complications, the appropriate use of her medications, realistic expectations, the goals of pain management (increased in functionality) and the need to bring and keep the BMI below 30.  Future Appointments  Date Time Provider [place]  09/26/2018 12:45 PM [facility] None  09/26/2018  1:00 PM Lloyd Huger, MD [facility] None  09/26/2018  1:30 PM [facility] INFUSION CHAIR 3 [facility] None    Primary Care Physician: Rusty Aus, MD Location: [facility] Note by: [name], M.D Date: 05/25/2018; Time: 11:56  AM  Patient Instructions  Follow up as needed

## 2018-06-08 ENCOUNTER — Other Ambulatory Visit: Payer: Self-pay | Admitting: Pulmonary Disease

## 2018-06-08 MED ORDER — FLUTICASONE PROPIONATE HFA 220 MCG/ACT IN AERO: INHALATION_SPRAY | RESPIRATORY_TRACT | 0 refills | Status: DC

## 2018-06-09 ENCOUNTER — Other Ambulatory Visit: Payer: Self-pay

## 2018-06-09 ENCOUNTER — Ambulatory Visit
Admission: EM | Admit: 2018-06-09 | Discharge: 2018-06-09 | Disposition: A | Discharge status: Home / Self Care | Payer: Medicare Other | Attending: Internal Medicine | Admitting: Internal Medicine

## 2018-06-09 ENCOUNTER — Encounter: Payer: Self-pay | Admitting: Emergency Medicine

## 2018-06-09 DIAGNOSIS — D509 Iron deficiency anemia, unspecified: Secondary | ICD-10-CM | POA: Diagnosis not present

## 2018-06-09 DIAGNOSIS — I2510 Atherosclerotic heart disease of native coronary artery without angina pectoris: Secondary | ICD-10-CM | POA: Insufficient documentation

## 2018-06-09 DIAGNOSIS — Z885 Allergy status to narcotic agent status: Secondary | ICD-10-CM | POA: Insufficient documentation

## 2018-06-09 DIAGNOSIS — Z9049 Acquired absence of other specified parts of digestive tract: Secondary | ICD-10-CM | POA: Insufficient documentation

## 2018-06-09 DIAGNOSIS — E785 Hyperlipidemia, unspecified: Secondary | ICD-10-CM | POA: Diagnosis not present

## 2018-06-09 DIAGNOSIS — K7460 Unspecified cirrhosis of liver: Secondary | ICD-10-CM | POA: Insufficient documentation

## 2018-06-09 DIAGNOSIS — E1151 Type 2 diabetes mellitus with diabetic peripheral angiopathy without gangrene: Secondary | ICD-10-CM | POA: Insufficient documentation

## 2018-06-09 DIAGNOSIS — E039 Hypothyroidism, unspecified: Secondary | ICD-10-CM | POA: Insufficient documentation

## 2018-06-09 DIAGNOSIS — Z8679 Personal history of other diseases of the circulatory system: Secondary | ICD-10-CM | POA: Diagnosis not present

## 2018-06-09 DIAGNOSIS — E1143 Type 2 diabetes mellitus with diabetic autonomic (poly)neuropathy: Secondary | ICD-10-CM | POA: Diagnosis not present

## 2018-06-09 DIAGNOSIS — R0789 Other chest pain: Secondary | ICD-10-CM

## 2018-06-09 DIAGNOSIS — M797 Fibromyalgia: Secondary | ICD-10-CM | POA: Insufficient documentation

## 2018-06-09 DIAGNOSIS — Z79890 Hormone replacement therapy (postmenopausal): Secondary | ICD-10-CM | POA: Insufficient documentation

## 2018-06-09 DIAGNOSIS — Z6841 Body Mass Index (BMI) 40.0 and over, adult: Secondary | ICD-10-CM | POA: Insufficient documentation

## 2018-06-09 DIAGNOSIS — I1310 Hypertensive heart and chronic kidney disease without heart failure, with stage 1 through stage 4 chronic kidney disease, or unspecified chronic kidney disease: Secondary | ICD-10-CM | POA: Diagnosis not present

## 2018-06-09 DIAGNOSIS — I440 Atrioventricular block, first degree: Secondary | ICD-10-CM | POA: Insufficient documentation

## 2018-06-09 DIAGNOSIS — G4733 Obstructive sleep apnea (adult) (pediatric): Secondary | ICD-10-CM | POA: Diagnosis not present

## 2018-06-09 DIAGNOSIS — Z79899 Other long term (current) drug therapy: Secondary | ICD-10-CM | POA: Insufficient documentation

## 2018-06-09 DIAGNOSIS — I252 Old myocardial infarction: Secondary | ICD-10-CM | POA: Diagnosis not present

## 2018-06-09 DIAGNOSIS — E113293 Type 2 diabetes mellitus with mild nonproliferative diabetic retinopathy without macular edema, bilateral: Secondary | ICD-10-CM | POA: Insufficient documentation

## 2018-06-09 DIAGNOSIS — Z8249 Family history of ischemic heart disease and other diseases of the circulatory system: Secondary | ICD-10-CM | POA: Insufficient documentation

## 2018-06-09 DIAGNOSIS — N182 Chronic kidney disease, stage 2 (mild): Secondary | ICD-10-CM | POA: Diagnosis not present

## 2018-06-09 DIAGNOSIS — R079 Chest pain, unspecified: Secondary | ICD-10-CM | POA: Diagnosis present

## 2018-06-09 DIAGNOSIS — E1122 Type 2 diabetes mellitus with diabetic chronic kidney disease: Secondary | ICD-10-CM | POA: Insufficient documentation

## 2018-06-09 DIAGNOSIS — E559 Vitamin D deficiency, unspecified: Secondary | ICD-10-CM | POA: Insufficient documentation

## 2018-06-09 DIAGNOSIS — Z96652 Presence of left artificial knee joint: Secondary | ICD-10-CM | POA: Insufficient documentation

## 2018-06-09 DIAGNOSIS — Z794 Long term (current) use of insulin: Secondary | ICD-10-CM | POA: Insufficient documentation

## 2018-06-09 DIAGNOSIS — Z888 Allergy status to other drugs, medicaments and biological substances status: Secondary | ICD-10-CM | POA: Diagnosis not present

## 2018-06-09 DIAGNOSIS — E1165 Type 2 diabetes mellitus with hyperglycemia: Secondary | ICD-10-CM | POA: Insufficient documentation

## 2018-06-09 MED ORDER — NITROGLYCERIN 2 % TD OINT
0.5000 [in_us] | TOPICAL_OINTMENT | Freq: Once | TRANSDERMAL | Status: AC
  Administered 2018-06-09: 0.5 [in_us] via TOPICAL

## 2018-06-09 NOTE — Discharge Instructions (Signed)
EKGs at the care tonight did not suggest a heart attack.  Chest pain can have many causes; the cause of your chest discomfort remains unclear at this time, but does not appear to be an acute heart attack.  Please call Dr. Etta Quill office first thing Monday morning, before 8:30 AM, to be seen on Monday 6/17 to discuss next steps.  A small dose of Nitropaste, 0.5 inch, was given at the urgent care, with improvement in blood pressure and no headache.  Okay to try nitroglycerin tablets at home as needed for chest discomfort.

## 2018-06-09 NOTE — ED Provider Notes (Signed)
[place]    CSN: [id number] Arrival date & time: 06/09/18  1850     History   Chief Complaint Chief Complaint  Patient presents with  . Chest Pain    HPI Brittney [name] is a 67 y.o. female.   She presents today with discomfort in her left chest, occurs as brief episodes might last for a few seconds or a few minutes, and has happened many times today.  When it occurs, it may be severe, 8/10.  Episodes of pain are not related to exertion and are not particularly improved with rest.  Equivocal mild increase in chronic shortness of breath.  No leg pain, no change in chronic leg swelling.  Little bit of coughing yesterday, but does not feel like she is coming down with something.  No fever.  Has been participating in pool exercises for the last 5 weeks; in the last 2 weeks, she feels like she is more tired after sessions, and sometimes still tired the next day. She had upper endoscopy 2 weeks ago, and has had some difficulty with central chest pressure, equivocally relieved by burping, since. Had a syncopal episode in 2015, fell, and work-up at the time revealed elevated cardiac enzymes.  Her cardiologist is Dr. Clayborn Bigness.   HPI  Past Medical History:  Diagnosis Date  . Abdominal pain, left upper quadrant 12/11/2012  . Arthritis   . Asthma   . Broken leg 2014   fell twice and broke same leg. had a bone stimulator for first break.  . Cancer ([place]) 2007   melanoma, left ear and back of left leg, removed from back  . Chronic kidney disease, stage 2 (mild)   . Chronic lower back pain   . Collagen vascular disease ([place])   . Coronary artery disease   . Diabetes mellitus without complication ([place])   . Diabetic nephropathy associated with secondary diabetes mellitus ([place])   . [place] 12/11/2012  . Gastroparesis   . Gross hematuria 12/11/2012  . Hepatic cirrhosis ([place])   . Hyperlipidemia   . Hypertension   . Hypothyroidism   . Hypothyroidism   . IBS (irritable bowel  syndrome)   . Kidney stone 12/11/2012  . Lower extremity edema   . Morbid obesity with BMI of 40.0-44.9, adult ([place])   . Myocardial infarction ([place]) 09/2013  . Nausea without vomiting 12/11/2012  . Nephrolithiasis   . Nephrolithiasis 04/26/2014  . Nonproliferative retinopathy due to secondary diabetes ([place])   . Numbness and tingling of right leg   . Peripheral vascular disease ([place])   . Renal colic 13/24/4010  . Sciatica   . Sciatica 12/11/2012  . Skin cancer   . Sleep apnea    sleep study coming in november  . Unilateral small kidney without contralateral hypertrophy     Patient Active Problem List   Diagnosis Date Noted  . Status post lumbar spine surgery for decompression of spinal cord 03/30/2018  . Myofascial pain 03/30/2018  . Spinal stenosis of thoracic region 03/30/2018  . Back pain 11/02/2017  . Iron deficiency anemia 05/10/2017  . Long term prescription benzodiazepine use 01/18/2017  . Vitamin D insufficiency 01/18/2017  . Neurogenic pain 01/17/2017  . L3-4 severe lumbar facet hypertrophy and spinal stenosis 01/10/2017  . Diabetes with retinopathy ([place]) 12/07/2016  . Diabetic nephropathy ([place]) 12/07/2016  . Gastroparesis due to DM ([place]) 12/07/2016  . Long term current use of opiate analgesic 12/07/2016  . Long term prescription opiate use 12/07/2016  . Opiate  use 12/07/2016  . Chronic pain syndrome 12/07/2016  . Chronic low back pain (Location of Primary Source of Pain) (Bilateral) (R>L) 12/07/2016  . 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 Chronic lower extremity pain (Location of Secondary source of pain) (Bilateral) (R>L) 12/07/2016  . Chronic knee pain (Location of Tertiary source of pain) (Right) 12/07/2016  . Osteoarthritis of knee (Right) 12/07/2016  . Grade 1 Anterolisthesis of L3 over L4 and L4 over L5 12/07/2016  . Osteoarthritis of sacroiliac joint (Right) 12/07/2016  . Generalized osteoarthritis of multiple sites 10/22/2016  .  Degenerative spondylolisthesis 09/08/2016  . L3-4 severe lumbar spinal stenosis (12/31/2016 MRI) 09/08/2016  . Lung nodule, multiple 08/31/2016  . Hepatic cirrhosis ([employer]) 06/20/2016  . Fibromyalgia 04/15/2016  . Seronegative arthritis 04/15/2016  . Diabetes mellitus with peripheral vascular disease ([place]) 03/16/2016  . Type 2 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, with long-term current use of insulin ([place]) 03/16/2016  . Morbid obesity with BMI of 40.0-44.9, adult ([place]) 03/16/2016  . Type 2 diabetes mellitus with diabetic nephropathy, with long-term current use of insulin ([place]) 03/16/2016  . Microalbuminuria 02/20/2016  . Severe uncontrolled hypertension 01/27/2016  . DM (diabetes mellitus) type II uncontrolled, periph vascular disorder ([place]) 04/26/2014  . Hyperlipemia 04/26/2014  . OSA (obstructive sleep apnea) 04/26/2014  . Abnormal tumor markers 12/11/2012  . Chronic kidney disease, stage II (mild) 12/11/2012  . Unilateral small kidney 12/11/2012    Past Surgical History:  Procedure Laterality Date  . ABDOMINAL HYSTERECTOMY    . BACK SURGERY  04/1999   L-4-5 LAMINECTOMY. metal in lower back and neck  . CESAREAN SECTION  1980  . CHOLECYSTECTOMY    . COLONOSCOPY  05/04/2001  . COLONOSCOPY WITH PROPOFOL N/A 09/20/2016   Procedure: COLONOSCOPY WITH PROPOFOL;  Surgeon: Lollie Sails, MD;  Location[facility]COPY;  Service: Endoscopy;  Laterality: N/A;  . DILATION AND CURETTAGE OF UTERUS    . ESOPHAGOGASTRODUODENOSCOPY  02/08/2014  . ESOPHAGOGASTRODUODENOSCOPY (EGD) WITH PROPOFOL N/A 09/20/2016   Procedure: ESOPHAGOGASTRODUODENOSCOPY (EGD) WITH PROPOFOL;  Surgeon: Lollie Sails, MD;  Location: [facility] ENDOSCOPY;  Service: Endoscopy;  Laterality: N/A;  . ESOPHAGOGASTRODUODENOSCOPY (EGD) WITH PROPOFOL N/A 05/23/2018   Procedure: ESOPHAGOGASTRODUODENOSCOPY (EGD) WITH PROPOFOL;  Surgeon: Lollie Sails, MD;  Location:  [facility] ENDOSCOPY;  Service: Endoscopy;  Laterality: N/A;  . EYE SURGERY  1986   FOR MELANOMA  . HERNIA REPAIR    . JOINT REPLACEMENT Left 09/24/2013   TOTAL KNEE  . MOHS SURGERY  11/2016   left side of nose  . PULSE GENERATOR IMPLANT N/A 11/02/2017   Procedure: UNILATERAL PULSE GENERATOR IMPLANT;  Surgeon: Meade Maw, MD;  Location: [facility] ORS;  Service: Neurosurgery;  Laterality: N/A;  . TONSILLECTOMY     WITH ADENOIDECTOMY      Home Medications    Prior to Admission medications   Medication Sig Start Date End Date Taking? Authorizing Provider  albuterol (VENTOLIN HFA) 108 (90 Base) MCG/ACT inhaler TAKE 2 PUFFS BY MOUTH 3 TIMES A DAY AS NEEDED FOR SHORTNESS OF BREATH 05/10/16  Yes [provider]  ALPRAZolam (XANAX) 0.5 MG tablet Take 0.5 mg by mouth 4 (four) times daily.  12/11/12  Yes [provider]  bumetanide (BUMEX) 1 MG tablet Take by mouth daily.    Yes [provider]  butalbital-acetaminophen-caffeine (FIORICET, ESGIC) 50-325-40 MG tablet TAKE ONE TABLET BY MOUTH ONCE DAILY 11/02/16  Yes [provider]  candesartan (ATACAND) 32  MG tablet Take 1 tablet (32 mg total) by mouth daily. 03/23/18 03/23/19 Yes Wilhelmina Mcardle, MD  carvedilol (COREG) 25 MG tablet Take 37.5-50 mg by mouth 2 (two) times daily with a meal. TAKES 2 TABLETS IN THE MORNING AND 1.5 TABLETS IN THE EVENING 03/25/17  Yes [provider]  cloNIDine (CATAPRES - DOSED IN MG/24 HR) 0.3 mg/24hr patch Place 0.3 mg onto the skin once a week.    Yes [provider]  cyclobenzaprine (FLEXERIL) 10 MG tablet Take 20 mg by mouth at bedtime.   Yes [provider]  doxazosin (CARDURA) 4 MG tablet Take 4 mg by mouth at bedtime.  12/11/12  Yes [provider]  escitalopram (LEXAPRO) 10 MG tablet Take 10 mg by mouth daily.  12/13/12  Yes [provider]  fluticasone (FLONASE) 50 MCG/ACT nasal spray PLACE 2 SPRAYS INTO BOTH NOSTRILS 2 (TWO) TIMES  DAILY AS NEEDED FOR ALLERGIES 08/20/16  Yes [provider]  fluticasone (FLOVENT HFA) 220 MCG/ACT inhaler TAKE 2 PUFFS BY MOUTH TWICE A DAY 06/08/18  Yes Wilhelmina Mcardle, MD  gabapentin (NEURONTIN) 600 MG tablet Take 1 tablet (600 mg total) by mouth 6 (six) times daily. Patient taking differently: Take 600 mg by mouth See admin instructions.  01/18/17 06/09/18 Yes Milinda Pointer, MD  hydrALAZINE (APRESOLINE) 25 MG tablet Take 25 mg by mouth at bedtime.  03/30/16  Yes [provider]  Insulin Detemir (LEVEMIR FLEXTOUCH) 100 UNIT/ML Pen INJECT 90 UNITS SUBCUTANEOUSLY TWICE A DAY 10/04/16  Yes [provider]  insulin regular human CONCENTRATED (HUMULIN R) 500 UNIT/ML kwikpen Inject 70-75 Units into the skin 3 (three) times daily with meals. PER SLIDING SCALE 03/16/16  Yes [provider]  lidocaine (LIDODERM) 5 % PLACE 1 PATCH ONTO THE MOST PAINFUL AREA OF SKIN DAILY FOR UP TO 12 HOURS IN A 24 HOUR PERIOD 10/22/16  Yes [provider]  pantoprazole (PROTONIX) 40 MG tablet TAKE 1 TABLET (40 MG TOTAL) BY MOUTH 2 (TWO) TIMES DAILY. TAKE ONE HOUR BEFORE A MEAL 08/22/15  Yes [provider]  potassium chloride (K-DUR,KLOR-CON) 10 MEQ tablet TAKE 1 TABLET BY MOUTH ONCE A DAY 08/19/14  Yes [provider]  pramipexole (MIRAPEX) 0.5 MG tablet TAKE 2 TABLETS BY MOUTH AT BEDTIME 11/16/16  Yes [provider]  tiZANidine (ZANAFLEX) 4 MG tablet Take 1 tablet (4 mg total) by mouth 2 (two) times daily as needed for muscle spasms. 03/30/18 03/30/19 Yes [name], MD  traMADol (ULTRAM) 50 MG tablet Take 2 tablets (100 mg total) by mouth every 8 (eight) hours. 01/18/17 06/09/18 Yes Milinda Pointer, MD  traZODone (DESYREL) 150 MG tablet Take 150 mg by mouth at bedtime.  12/11/12  Yes [provider]  zolpidem (AMBIEN) 10 MG tablet Take 10 mg by mouth at bedtime.  12/11/12  Yes [provider]  diclofenac sodium (VOLTAREN) 1 % GEL Apply  4 g topically as needed (PAIN).  09/12/14   [provider]  ibuprofen (ADVIL,MOTRIN) 800 MG tablet TAKE 1 TABLET (800 MG TOTAL) BY MOUTH 3 (THREE) TIMES DAILY AS NEEDED FOR PAIN. 03/15/18   [provider]  Insulin Pen Needle (FIFTY50 PEN NEEDLES) 32G X 6 MM MISC 5 (five) times daily. 09/27/16   [provider]  ketoconazole (NIZORAL) 2 % shampoo APPLY TO AFFECTED AREAS (LEAVE ON FOR 5 MINUTES) , RINSE WELL, USE 2-3 TIMES PER WEEK. 10/10/17   [provider]  lactulose (CHRONULAC) 10 GM/15ML solution Take  10 g by mouth daily as needed for mild constipation.    [provider]  levothyroxine (SYNTHROID, LEVOTHROID) 150 MCG tablet PLEASE SEE ATTACHED FOR DETAILED DIRECTIONS 03/16/18   [provider]  metoCLOPramide (REGLAN) 10 MG tablet Take 10 mg by mouth 3 (three) times daily. 12/11/12   [provider]  oxyCODONE-acetaminophen (PERCOCET) 10-325 MG tablet Take 1 tablet by mouth daily. 03/30/18   [name], MD    Family History Family History  Problem Relation Age of Onset  . Heart disease Mother   . Cancer Mother   . Asthma Mother   . Diabetes Father   . Kidney disease Father   . Hypertension Father   . Breast cancer Neg Hx     Social History Social History   Tobacco Use  . Smoking status: Never Smoker  . Smokeless tobacco: Never Used  Substance Use Topics  . Alcohol use: No  . Drug use: No     Allergies   Eggs or egg-derived products; Shellfish allergy; Amlodipine; Codeine; Imdur [isosorbide dinitrate]; Lyrica [pregabalin]; Monosodium glutamate; and Tape   Review of Systems Review of Systems  All other systems reviewed and are negative.    Physical Exam Triage Vital Signs ED Triage Vitals  Enc Vitals Group     BP 06/09/18 1906 (S) (!) 213/90     Pulse Rate 06/09/18 1906 82     Resp 06/09/18 1906 17     Temp 06/09/18 1906 98.8 F (37.1 C)     Temp Source 06/09/18 1906 Oral     SpO2 06/09/18 1906 95 %       Weight 06/09/18 1903 287 lb (130.2 kg)     Height 06/09/18 1903 5\' 6"  (1.676 m)     Pain Score 06/09/18 1903 8     Pain Loc --    Updated Vital Signs BP (!) 157/85 (BP Location: Left Arm)   Pulse 79   Temp 98.8 F (37.1 C) (Oral)   Resp 17   Ht 5\' 6"  (1.676 m)   Wt 287 lb (130.2 kg)   SpO2 94%   BMI 46.32 kg/m  Physical Exam  Constitutional: She is oriented to person, place, and time. No distress.  Sitting upright on the side of the stretcher  HENT:  Head: Atraumatic.  Eyes:  Conjugate gaze observed, no eye redness/discharge  Neck: Neck supple.  Cardiovascular: Normal rate and regular rhythm.  Pulmonary/Chest: No respiratory distress. She has no wheezes. She has no rales.  Lungs clear, symmetric breath sounds   Abdominal: She exhibits no distension.  Musculoskeletal: Normal range of motion.  Symmetric, puffy edema of the bilateral lower extremities, particularly feet and ankles.  Neurological: She is alert and oriented to person, place, and time.  Skin: Skin is warm and dry.  Nursing note and vitals reviewed.    UC Treatments / Results  Labs (all labs ordered are listed, but only abnormal results are displayed) Labs Reviewed - No data to display  EKG Sinus rhythm at a rate of 79 bpm, no acute ST or T wave changes. Does not appear significantly changed compared to ECG of 10/28/2017.  Repeat ECG tonight at 2005 appears stable compared to the initial ECG of 1909.  Radiology No results found.  Procedures Procedures (including critical care time)  Medications Ordered in UC Medications  nitroGLYCERIN (NITROGLYN) 2 % ointment 0.5 inch (0.5 inches Topical Given 06/09/18 1946)  BP improved to 371I systolic after 0.5 inch Nitropaste applied  Spoke with  Dr. Clayborn Bigness, who will see patient in follow-up Monday 6/17.  Final Clinical Impressions(s) / UC Diagnoses   Final diagnoses:  Atypical chest pain  History of MI (myocardial infarction)     Discharge  Instructions     EKGs at the care tonight did not suggest a heart attack.  Chest pain can have many causes; the cause of your chest discomfort remains unclear at this time, but does not appear to be an acute heart attack.  Please call Dr. Etta Quill office first thing Monday morning, before 8:30 AM, to be seen on Monday 6/17 to discuss next steps.  A small dose of Nitropaste, 0.5 inch, was given at the urgent care, with improvement in blood pressure and no headache.  Okay to try nitroglycerin tablets at home as needed for chest discomfort.      Wynona Luna, MD 06/13/18 [phone]

## 2018-06-09 NOTE — ED Triage Notes (Signed)
Patient c/o chest pain off and on for 2 weeks.  Patient reports that she always has SOB.  Patient states that she had a MI in October 2015.

## 2018-07-05 ENCOUNTER — Other Ambulatory Visit: Payer: Self-pay | Admitting: Pulmonary Disease

## 2018-07-06 ENCOUNTER — Other Ambulatory Visit: Payer: Self-pay | Admitting: Gastroenterology

## 2018-07-06 DIAGNOSIS — K7460 Unspecified cirrhosis of liver: Secondary | ICD-10-CM

## 2018-07-18 ENCOUNTER — Other Ambulatory Visit: Payer: Self-pay | Admitting: Internal Medicine

## 2018-07-18 DIAGNOSIS — Z1231 Encounter for screening mammogram for malignant neoplasm of breast: Secondary | ICD-10-CM

## 2018-07-25 ENCOUNTER — Telehealth: Payer: Self-pay | Admitting: Student in an Organized Health Care Education/Training Program

## 2018-07-25 NOTE — Telephone Encounter (Signed)
Patient is c/o left back pain and a heaviness in both legs.  Has communicated with her Rep re; spinal cord stimulator and they have turned the stimulator up.  She is having trouble getting around and is having difficulty with aquatic class, it ususally only began hurting towards the end of class but now hurts so much that she has had to cancel. She continues to take her oxycodone 10 mg 1 po daily which is helping her and is the only way she feels she can get around.

## 2018-07-25 NOTE — Telephone Encounter (Signed)
Patient lvmail on Monday 07-24-18 at 3:51 stating she is hurting in her back and leg really bad again. Would like to speak with someone about this

## 2018-07-25 NOTE — Telephone Encounter (Signed)
Is there swelling  Heaviness is concerning for something else.  Is the SCS turned up to high She needs to go to acute care to see if something else is going on

## 2018-08-01 ENCOUNTER — Ambulatory Visit: Payer: Medicare Other | Admitting: Physical Therapy

## 2018-08-03 ENCOUNTER — Ambulatory Visit
Admission: RE | Admit: 2018-08-03 | Discharge: 2018-08-03 | Disposition: A | Discharge status: Home / Self Care | Payer: Medicare Other | Source: Ambulatory Visit | Attending: Internal Medicine | Admitting: Internal Medicine

## 2018-08-03 DIAGNOSIS — Z1231 Encounter for screening mammogram for malignant neoplasm of breast: Secondary | ICD-10-CM | POA: Diagnosis not present

## 2018-08-07 ENCOUNTER — Encounter: Payer: Medicare Other | Admitting: Physical Therapy

## 2018-08-08 ENCOUNTER — Ambulatory Visit
Admission: RE | Admit: 2018-08-08 | Discharge: 2018-08-08 | Disposition: A | Discharge status: Home / Self Care | Payer: Medicare Other | Source: Ambulatory Visit | Attending: Student in an Organized Health Care Education/Training Program | Admitting: Student in an Organized Health Care Education/Training Program

## 2018-08-08 ENCOUNTER — Encounter: Payer: Self-pay | Admitting: Student in an Organized Health Care Education/Training Program

## 2018-08-08 ENCOUNTER — Ambulatory Visit
Payer: Medicare Other | Attending: Student in an Organized Health Care Education/Training Program | Admitting: Student in an Organized Health Care Education/Training Program

## 2018-08-08 VITALS — BP 177/61 | HR 70 | Temp 98.7°F | Resp 16 | Ht 66.0 in | Wt 295.1 lb

## 2018-08-08 DIAGNOSIS — M5416 Radiculopathy, lumbar region: Principal | ICD-10-CM

## 2018-08-08 DIAGNOSIS — M48061 Spinal stenosis, lumbar region without neurogenic claudication: Secondary | ICD-10-CM | POA: Diagnosis not present

## 2018-08-08 DIAGNOSIS — I2510 Atherosclerotic heart disease of native coronary artery without angina pectoris: Secondary | ICD-10-CM | POA: Diagnosis not present

## 2018-08-08 DIAGNOSIS — D509 Iron deficiency anemia, unspecified: Secondary | ICD-10-CM | POA: Insufficient documentation

## 2018-08-08 DIAGNOSIS — E1122 Type 2 diabetes mellitus with diabetic chronic kidney disease: Secondary | ICD-10-CM | POA: Diagnosis not present

## 2018-08-08 DIAGNOSIS — K589 Irritable bowel syndrome without diarrhea: Secondary | ICD-10-CM | POA: Diagnosis not present

## 2018-08-08 DIAGNOSIS — Z885 Allergy status to narcotic agent status: Secondary | ICD-10-CM | POA: Diagnosis not present

## 2018-08-08 DIAGNOSIS — M47816 Spondylosis without myelopathy or radiculopathy, lumbar region: Secondary | ICD-10-CM

## 2018-08-08 DIAGNOSIS — Z9689 Presence of other specified functional implants: Secondary | ICD-10-CM | POA: Insufficient documentation

## 2018-08-08 DIAGNOSIS — M961 Postlaminectomy syndrome, not elsewhere classified: Secondary | ICD-10-CM | POA: Diagnosis not present

## 2018-08-08 DIAGNOSIS — J45909 Unspecified asthma, uncomplicated: Secondary | ICD-10-CM | POA: Insufficient documentation

## 2018-08-08 DIAGNOSIS — I129 Hypertensive chronic kidney disease with stage 1 through stage 4 chronic kidney disease, or unspecified chronic kidney disease: Secondary | ICD-10-CM | POA: Diagnosis not present

## 2018-08-08 DIAGNOSIS — Z9889 Other specified postprocedural states: Secondary | ICD-10-CM | POA: Diagnosis not present

## 2018-08-08 DIAGNOSIS — E1143 Type 2 diabetes mellitus with diabetic autonomic (poly)neuropathy: Secondary | ICD-10-CM | POA: Insufficient documentation

## 2018-08-08 DIAGNOSIS — Z79899 Other long term (current) drug therapy: Secondary | ICD-10-CM | POA: Insufficient documentation

## 2018-08-08 DIAGNOSIS — E1121 Type 2 diabetes mellitus with diabetic nephropathy: Secondary | ICD-10-CM | POA: Diagnosis not present

## 2018-08-08 DIAGNOSIS — I252 Old myocardial infarction: Secondary | ICD-10-CM | POA: Diagnosis not present

## 2018-08-08 DIAGNOSIS — Z79891 Long term (current) use of opiate analgesic: Secondary | ICD-10-CM | POA: Insufficient documentation

## 2018-08-08 DIAGNOSIS — Z9049 Acquired absence of other specified parts of digestive tract: Secondary | ICD-10-CM | POA: Diagnosis not present

## 2018-08-08 DIAGNOSIS — N182 Chronic kidney disease, stage 2 (mild): Secondary | ICD-10-CM | POA: Insufficient documentation

## 2018-08-08 DIAGNOSIS — Z794 Long term (current) use of insulin: Secondary | ICD-10-CM | POA: Insufficient documentation

## 2018-08-08 DIAGNOSIS — M545 Low back pain: Secondary | ICD-10-CM | POA: Insufficient documentation

## 2018-08-08 DIAGNOSIS — R200 Anesthesia of skin: Secondary | ICD-10-CM | POA: Insufficient documentation

## 2018-08-08 DIAGNOSIS — Z888 Allergy status to other drugs, medicaments and biological substances status: Secondary | ICD-10-CM | POA: Diagnosis not present

## 2018-08-08 DIAGNOSIS — E785 Hyperlipidemia, unspecified: Secondary | ICD-10-CM | POA: Insufficient documentation

## 2018-08-08 DIAGNOSIS — E039 Hypothyroidism, unspecified: Secondary | ICD-10-CM | POA: Diagnosis not present

## 2018-08-08 DIAGNOSIS — M797 Fibromyalgia: Secondary | ICD-10-CM | POA: Insufficient documentation

## 2018-08-08 DIAGNOSIS — M4804 Spinal stenosis, thoracic region: Secondary | ICD-10-CM | POA: Diagnosis not present

## 2018-08-08 DIAGNOSIS — Z90710 Acquired absence of both cervix and uterus: Secondary | ICD-10-CM | POA: Insufficient documentation

## 2018-08-08 MED ORDER — DICLOFENAC SODIUM 75 MG PO TBEC: 75.0000 mg | DELAYED_RELEASE_TABLET | Freq: Two times a day (BID) | ORAL | 0 refills | Status: DC

## 2018-08-08 NOTE — Progress Notes (Signed)
Nursing Pain Medication Assessment:  Safety precautions to be maintained throughout the outpatient stay will include: orient to surroundings, keep bed in low position, maintain call bell within reach at all times, provide assistance with transfer out of bed and ambulation.  Medication Inspection Compliance: Pill count conducted under aseptic conditions, in front of the patient. Neither the pills nor the bottle was removed from the patient's sight at any time. Once count was completed pills were immediately returned to the patient in their original bottle.  Medication: Oxycodone IR Pill/Patch Count: 24 of 30 pills remain Pill/Patch Appearance: Markings consistent with prescribed medication Bottle Appearance: Standard pharmacy container. Clearly labeled. Filled Date: 04 / 09 / 2019 Last Medication intake:  3 weeks ago

## 2018-08-08 NOTE — Progress Notes (Signed)
Patient's Name: Brittney [name]  MRN: [id number]  Referring Provider: Rusty Aus, MD  DOB: 10-28-1951  PCP: Rusty Aus, MD  DOS: 08/08/2018  Note by: [name], MD  Service setting: Ambulatory outpatient  Specialty: Interventional Pain Management  Location: [facility] (AMB) Pain Management Facility    Patient type: Established   Primary Reason(s) for Visit: Evaluation of chronic illnesses with exacerbation, or progression (Level of risk: moderate) CC: Back Pain (lower left is worse )  HPI  Brittney [name] is a 67 y.o. year old, female patient, who comes today for a follow-up evaluation. She has Abnormal tumor markers; Chronic kidney disease, stage II (mild); Degenerative spondylolisthesis; Diabetes mellitus with peripheral vascular disease ([place]); Diabetes with retinopathy ([place]); Diabetic nephropathy ([place]); DM (diabetes mellitus) type II uncontrolled, periph vascular disorder ([place]); Fibromyalgia; Gastroparesis due to DM ([place]); Generalized osteoarthritis of multiple sites; Hepatic cirrhosis ([place]); Hyperlipemia; Lung nodule, multiple; Microalbuminuria; OSA (obstructive sleep apnea); Seronegative arthritis; Severe uncontrolled hypertension; L3-4 severe lumbar spinal stenosis (12/31/2016 MRI); Type 2 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, with long-term current use of insulin ([place]); Unilateral small kidney; Long term current use of opiate analgesic; Long term prescription opiate use; Opiate use; Chronic pain syndrome; Morbid obesity with BMI of 40.0-44.9, adult ([place]); Type 2 diabetes mellitus with diabetic nephropathy, with long-term current use of insulin ([place]); Chronic low back pain (Location of Primary Source of Pain) (Bilateral) (R>L); 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); Chronic lower extremity pain (Location of Secondary source of pain) (Bilateral) (R>L); Chronic knee pain (Location of Tertiary source of pain) (Right); Osteoarthritis of  knee (Right); Grade 1 Anterolisthesis of L3 over L4 and L4 over L5; Osteoarthritis of sacroiliac joint (Right); L3-4 severe lumbar facet hypertrophy and spinal stenosis; Neurogenic pain; Long term prescription benzodiazepine use; Vitamin D insufficiency; Iron deficiency anemia; Back pain; Status post lumbar spine surgery for decompression of spinal cord; Myofascial pain; and Spinal stenosis of thoracic region on their problem list. Brittney [name] was last seen on 07/25/2018. Her primarily concern today is the Back Pain (lower left is worse )  Pain Assessment: Location: Lower, Left, Right(left is worse ) Back Radiating: down the left leg  Onset: More than a month ago Duration: Chronic pain Quality: Discomfort, Numbness(excruciating) Severity: 8 /10 (subjective, self-reported pain score)  Note: Reported level is inconsistent with clinical observations.                         When using our objective Pain Scale, levels between 6 and 10/10 are said to belong in an emergency room, as it progressively worsens from a 6/10, described as severely limiting, requiring emergency care not usually available at an outpatient pain management facility. At a 6/10 level, communication becomes difficult and requires great effort. Assistance to reach the emergency department may be required. Facial flushing and profuse sweating along with potentially dangerous increases in heart rate and blood pressure will be evident. Effect on ADL: unable to stand up straight  Timing: Constant Modifying factors: nothing is currently helping, is having to take additional pain medication  BP: (!) 177/61  HR: 70  Further details on both, my assessment(s), as well as the proposed treatment plan, please see below.  67 year old female with multiple medical issues with a history of failed back surgical syndrome status post NEVRO spinal cord stimulator implant who presents with worsening leg pain left greater than right.  Patient is endorsing  heaviness  in her legs.  She states that she turned her spinal cord stimulator off for a couple of days and did not notice any difference in her pain than when she had it on.  Patient also has a history of fibromyalgia, liver cirrhosis, type 2 diabetes with A1c greater than 8 along with severe anxiety.  She also has iron deficiency anemia.  Could all be contributing to her chronic fatigue and chronic pain.  Today I will obtain an x-ray of her lumbar spine to evaluate for lead fracture versus lead migration.  If this is negative, we may consider lumbar spine CT to evaluate for worsening foraminal pathology or spinal pathology.  I recommended the patient take a break from aquatic therapy since this is been causing her worsening pain.  Given that her A1c is elevated, we will avoid a steroid taper at this time although I think it could be beneficial.  I have instructed the patient to discontinue her ibuprofen and will trial diclofenac 75 mg twice daily.  I will see the patient back in approximately 1 month.  Laboratory Chemistry  Inflammation Markers (CRP: Acute Phase) (ESR: Chronic Phase) Lab Results  Component Value Date   CRP 1.2 (H) 12/07/2016   ESRSEDRATE 56 (H) 12/07/2016                         Rheumatology Markers No results found for: RF, ANA, LABURIC, URICUR, LYMEIGGIGMAB, LYMEABIGMQN, HLAB27                      Renal Function Markers Lab Results  Component Value Date   BUN 14 10/28/2017   CREATININE 0.60 10/28/2017   GFRAA >60 10/28/2017   GFRNONAA >60 10/28/2017                             Hepatic Function Markers Lab Results  Component Value Date   AST 29 10/28/2017   ALT 24 10/28/2017   ALBUMIN 3.4 (L) 10/28/2017   ALKPHOS 103 10/28/2017   LIPASE 114 10/22/2014                        Electrolytes Lab Results  Component Value Date   NA 137 10/28/2017   K 3.8 10/28/2017   CL 101 10/28/2017   CALCIUM 9.5 10/28/2017   MG 1.8 12/07/2016                         Neuropathy Markers Lab Results  Component Value Date   VITAMINB12 787 12/07/2016   HGBA1C 9.3 (H) 10/23/2014                        Bone Pathology Markers Lab Results  Component Value Date   25OHVITD1 26 (L) 12/07/2016   25OHVITD2 1.2 12/07/2016   25OHVITD3 25 12/07/2016                         Coagulation Parameters Lab Results  Component Value Date   INR 0.95 05/23/2018   LABPROT 12.6 05/23/2018   APTT 34.9 10/22/2014   PLT 172 05/24/2018                        Cardiovascular Markers Lab Results  Component Value Date   CKTOTAL 161 10/23/2014  CKMB 1.0 10/23/2014   TROPONINI 0.06 (H) 10/23/2014   HGB 12.3 05/24/2018   HCT 36.5 05/24/2018                         CA Markers No results found for: CEA, CA125, LABCA2                      Note: Lab results reviewed.  Recent Diagnostic Imaging Review  Cervical Imaging:  Results for orders placed in visit on 03/26/03  DG Cervical Spine 1 View   Narrative FINDINGS CLINICAL DATA:  C5-6 ACDF. C-ARM C-ARM FLUOROSCOPY WAS PROVIDED. IMPRESSION C-ARM FLUOROSCOPY WAS PROVIDED. PORTABLE LATERAL CERVICAL SPINE A SINGLE SOMEWHAT OBLIQUE VIEW SHOWS PERFORMANCE OF ANTERIOR CERVICAL DISKECTOMY AND FUSION AT C5- 6.  THE ANTERIOR PLATE APPEARS GROSSLY WELL POSITIONED.  DETAIL DOES NOT REALLY PERMIT EVALUATION OF THE INTERBODY ALLOGRAFT. IMPRESSION ACDF C5-6.   Cervical DG 2-3 views:  Results for orders placed in visit on 05/21/03  DG Cervical Spine 2-3 Views   Narrative FINDINGS CLINICAL DATA:  CERVICAL SPINE FUSION, MARCH OF 2004, NOW WITH PAIN. THREE VIEWS OF CERVICAL SPINE THREE VIEWS OF THE CERVICAL SPINE WERE OBTAINED, INCLUDING NEUTRAL LATERAL VIEW AND VIEWS IN FLEXION AND EXTENSION.  IN THE NEUTRAL LATERAL VIEW, ANTERIOR FUSION IS NOTED AT C5-6.  THE ANTERIOR METALLIC FUSION PLATE AND INTERBODY PLUG ARE IN GOOD POSITION, AND NORMAL HEIGHT IS MAINTAINED.  NORMAL ALIGNMENT IS PRESENT.  THROUGH FLEXION AND  EXTENSION, THERE IS SLIGHTLY LIMITED MOTION, BUT NO MALALIGNMENT IS SEEN. NO SIGNIFICANT PREVERTEBRAL SOFT TISSUE SWELLING IS NOTED. IMPRESSION ANTERIOR FUSION AT C5-6.  SLIGHTLY LIMITED RANGE OF MOTION THROUGH FLEXION AND EXTENSION BUT NO MALALIGNMENT.    Thoracic Imaging: Thoracic MR wo contrast:  Results for orders placed during the hospital encounter of 08/18/17  MR THORACIC SPINE WO CONTRAST   Narrative CLINICAL DATA:  Low back pain, RIGHT leg numbness with occasional pain. History of L4-5 laminectomy. Assess prior to placement of neurostimulator.  EXAM: MRI THORACIC WITHOUT CONTRAST  MRI LUMBAR SPINE WITHOUT AND WITH CONTRAST  TECHNIQUE: Multiplanar and multiecho pulse sequences of the thoracic spine were obtained without intravenous contrast.  Multiplanar and multiecho pulse sequences of the thoracic and lumbar spine were obtained without and with intravenous contrast.  CONTRAST:  63m MULTIHANCE GADOBENATE DIMEGLUMINE 529 MG/ML IV SOLN  COMPARISON:  CT chest, abdomen and pelvis May 25, 2017 and MRI of the lumbar spine December 31, 2016  FINDINGS: MRI THORACIC SPINE FINDINGS  ALIGNMENT: Maintenance of the thoracic kyphosis. No malalignment.  VERTEBRAE/DISCS: Status post C5-6 approximate ACDF, susceptibility artifact limits precise numbering. Vertebral bodies are intact. Mild disc desiccation all levels with mild chronic discogenic endplate changes. Scattered predominately bright T1 and bright STIR signal hemangioma.  CORD: Thoracic spinal cord is normal morphology and signal characteristics.  PREVERTEBRAL AND PARASPINAL SOFT TISSUES:  Nonacute.  DISC LEVELS:  T1-2 through T6-7: No disc bulge, canal stenosis nor neural foraminal narrowing.  T7-8: Small central disc protrusion without canal stenosis or neural foraminal narrowing.  T8-9 in T9-10: No disc bulge, canal stenosis nor neural foraminal narrowing.  T10-11: Annular bulging. No canal stenosis or  neural foraminal narrowing.  T11-12 and T12-L1: No disc bulge, canal stenosis nor neural foraminal narrowing. Mild facet arthropathy.  MRI LUMBAR SPINE FINDINGS  SEGMENTATION: For the purposes of this report, the last well-formed intervertebral disc will be reported as L5-S1.  ALIGNMENT: Maintained lumbar lordosis. Stable grade 1  L4-5 anterolisthesis.  VERTEBRAE:Vertebral bodies intact. L3 through L5 PLIF, hardware results in susceptibility artifact. Non surgically altered discs demonstrate normal morphology, slight desiccation. No abnormal or acute bone marrow signal. No abnormal osseous or disc enhancement.  CONUS MEDULLARIS: Conus medullaris terminates at T12-L1 and demonstrates normal morphology and signal characteristics. Cauda equina is normal. No abnormal cord, leptomeningeal or epidural enhancement.  PARASPINAL AND SOFT TISSUES: Severe paraspinal muscle atrophy at and below the level surgical intervention. Newly atrophic RIGHT iliopsoas muscle.  DISC LEVELS:  L1-2: No disc bulge, canal stenosis nor neural foraminal narrowing. Mild facet arthropathy.  L2-3: Small similar RIGHT subarticular disc protrusion. Mild facet arthropathy and ligamentum flavum redundancy. Mild canal stenosis. Minimal RIGHT neural foraminal narrowing.  L3-4: New PLIF and posterior decompression. New RIGHT lateral plate and screw fixation, the plate extends 2 cm laterally into the psoas muscle and may affect the exited RIGHT L3 nerve. Minimal canal stenosis. No residual synovial cyst. Mild neural foraminal narrowing.  L4-5: PLIF. Anterolisthesis. Posterior decompression. Mild neural foraminal narrowing may be overestimated by hardware artifact.  L5-S1: No disc bulge, canal stenosis nor neural foraminal narrowing. Moderate to severe facet arthropathy with trace effusions which are likely reactive. No canal stenosis. Mild LEFT neural foraminal narrowing may be overestimated by hardware  artifact.  IMPRESSION: MRI thoracic spine:  1. Degenerative change without canal stenosis or neural foraminal narrowing. MRI lumbar spine:  1. Interval L3-4 PLIF. RIGHT lateral L3-4 plate and screw fixation ; plate has backed out 2 cm into the iliopsoas muscle and may affect the exited RIGHT L3 nerve. New RIGHT iliopsoas muscle atrophy. 2. L4-5 PLIF, stable grade 1 L4-5 anterolisthesis. 3. Minimal canal stenosis L3-4, mild at L2-3. Minimal to mild L2-3 through L5-S1 neural foraminal narrowing.   Electronically Signed   By: Elon Alas M.D.   On: 08/18/2017 20:25     Lumbar MR w/wo contrast:  Results for orders placed during the hospital encounter of 08/18/17  MR Lumbar Spine W Wo Contrast   Narrative CLINICAL DATA:  Low back pain, RIGHT leg numbness with occasional pain. History of L4-5 laminectomy. Assess prior to placement of neurostimulator.  EXAM: MRI THORACIC WITHOUT CONTRAST  MRI LUMBAR SPINE WITHOUT AND WITH CONTRAST  TECHNIQUE: Multiplanar and multiecho pulse sequences of the thoracic spine were obtained without intravenous contrast.  Multiplanar and multiecho pulse sequences of the thoracic and lumbar spine were obtained without and with intravenous contrast.  CONTRAST:  55m MULTIHANCE GADOBENATE DIMEGLUMINE 529 MG/ML IV SOLN  COMPARISON:  CT chest, abdomen and pelvis May 25, 2017 and MRI of the lumbar spine December 31, 2016  FINDINGS: MRI THORACIC SPINE FINDINGS  ALIGNMENT: Maintenance of the thoracic kyphosis. No malalignment.  VERTEBRAE/DISCS: Status post C5-6 approximate ACDF, susceptibility artifact limits precise numbering. Vertebral bodies are intact. Mild disc desiccation all levels with mild chronic discogenic endplate changes. Scattered predominately bright T1 and bright STIR signal hemangioma.  CORD: Thoracic spinal cord is normal morphology and signal characteristics.  PREVERTEBRAL AND PARASPINAL SOFT TISSUES:  Nonacute.  DISC  LEVELS:  T1-2 through T6-7: No disc bulge, canal stenosis nor neural foraminal narrowing.  T7-8: Small central disc protrusion without canal stenosis or neural foraminal narrowing.  T8-9 in T9-10: No disc bulge, canal stenosis nor neural foraminal narrowing.  T10-11: Annular bulging. No canal stenosis or neural foraminal narrowing.  T11-12 and T12-L1: No disc bulge, canal stenosis nor neural foraminal narrowing. Mild facet arthropathy.  MRI LUMBAR SPINE FINDINGS  SEGMENTATION: For the purposes of this  report, the last well-formed intervertebral disc will be reported as L5-S1.  ALIGNMENT: Maintained lumbar lordosis. Stable grade 1 L4-5 anterolisthesis.  VERTEBRAE:Vertebral bodies intact. L3 through L5 PLIF, hardware results in susceptibility artifact. Non surgically altered discs demonstrate normal morphology, slight desiccation. No abnormal or acute bone marrow signal. No abnormal osseous or disc enhancement.  CONUS MEDULLARIS: Conus medullaris terminates at T12-L1 and demonstrates normal morphology and signal characteristics. Cauda equina is normal. No abnormal cord, leptomeningeal or epidural enhancement.  PARASPINAL AND SOFT TISSUES: Severe paraspinal muscle atrophy at and below the level surgical intervention. Newly atrophic RIGHT iliopsoas muscle.  DISC LEVELS:  L1-2: No disc bulge, canal stenosis nor neural foraminal narrowing. Mild facet arthropathy.  L2-3: Small similar RIGHT subarticular disc protrusion. Mild facet arthropathy and ligamentum flavum redundancy. Mild canal stenosis. Minimal RIGHT neural foraminal narrowing.  L3-4: New PLIF and posterior decompression. New RIGHT lateral plate and screw fixation, the plate extends 2 cm laterally into the psoas muscle and may affect the exited RIGHT L3 nerve. Minimal canal stenosis. No residual synovial cyst. Mild neural foraminal narrowing.  L4-5: PLIF. Anterolisthesis. Posterior decompression. Mild  neural foraminal narrowing may be overestimated by hardware artifact.  L5-S1: No disc bulge, canal stenosis nor neural foraminal narrowing. Moderate to severe facet arthropathy with trace effusions which are likely reactive. No canal stenosis. Mild LEFT neural foraminal narrowing may be overestimated by hardware artifact.  IMPRESSION: MRI thoracic spine:  1. Degenerative change without canal stenosis or neural foraminal narrowing. MRI lumbar spine:  1. Interval L3-4 PLIF. RIGHT lateral L3-4 plate and screw fixation ; plate has backed out 2 cm into the iliopsoas muscle and may affect the exited RIGHT L3 nerve. New RIGHT iliopsoas muscle atrophy. 2. L4-5 PLIF, stable grade 1 L4-5 anterolisthesis. 3. Minimal canal stenosis L3-4, mild at L2-3. Minimal to mild L2-3 through L5-S1 neural foraminal narrowing.   Electronically Signed   By: Elon Alas M.D.   On: 08/18/2017 20:25     Lumbar CT wo contrast:  Results for orders placed during the hospital encounter of 02/18/17  CT LUMBAR SPINE WO CONTRAST   Narrative CLINICAL DATA:  Low back pain  EXAM: CT LUMBAR SPINE WITHOUT CONTRAST  TECHNIQUE: Multidetector CT imaging of the lumbar spine was performed without intravenous contrast administration. Multiplanar CT image reconstructions were also generated.  COMPARISON:  MRI 05/10/2017  FINDINGS: Segmentation: 5 lumbar type vertebrae.  Alignment: Mild levoscoliosis.  Fused L4-5 anterolisthesis.  Vertebrae: Remote L4 superior endplate fracture with right preferential height loss. The right L4 pedicle screw reaches the superior endplate. No acute fracture noted. No evidence of discitis or aggressive bone lesion.  Paraspinal and other soft tissues: Punctate left renal calculus. Atherosclerotic calcification. Colonic diverticulosis.  Disc levels:  T12- L1: Minimal spondylosis.  Mild facet spurring.  No impingement  L1-L2: Minimal spondylosis.  Mild facet spurring.  No  impingement  L2-L3: Minimal spondylosis. Facet hypertrophy. Mild triangular narrowing of the thecal sac.  L3-L4: Severe facet arthropathy with hypertrophy and spurring. Disc narrowing and bulging. Advanced spinal stenosis. A least moderate bilateral foraminal stenosis.  L4-L5: PLIF with solid bony fusion. Posterior decompression. No residual impingement.  L5-S1:Facet arthropathy with moderate spurring. Spondylosis with ventral osteophyte. Patent spinal canal. Mild bilateral foraminal narrowing.  IMPRESSION: 1. L3-4 advanced adjacent segment facet arthropathy with advanced spinal stenosis. Moderate biforaminal narrowing. 2. L4-5 PLIF with solid bony fusion. 3. Remote L4 superior endplate fracture. The right L4 pedicle screw tip is at the superior endplate.   Electronically  Signed   By: Monte Fantasia M.D.   On: 02/18/2017 14:27     Lumbar DG 2-3 views:  Results for orders placed during the hospital encounter of 11/02/17  DG Lumbar Spine 2-3 Views   Narrative CLINICAL DATA:  Surgery  EXAM: DG C-ARM GT 120 MIN; LUMBAR SPINE - 2-3 VIEW  CONTRAST:  None  FLUOROSCOPY TIME:  Fluoroscopy Time:  2 minutes 30 seconds  Radiation Exposure Index (if provided by the fluoroscopic device): Not provided  Number of Acquired Spot Images: 8  COMPARISON:  10/18/2017  FINDINGS: Submitted images demonstrate removal of previously identified intraspinal leads.  New intraspinal stimulator is identified with leads projecting over the caudal aspect of the thoracic spinal canal question at T10-T11.  Landmarks are difficult to visualize due to degree of demineralization.  IMPRESSION: New intraspinal stimulator with leads projecting at approximately T10-T11.   Electronically Signed   By: Lavonia Dana M.D.   On: 11/02/2017 12:04     Complexity Note: Imaging results reviewed. Results shared with Brittney [name], using Layman's terms.                         Meds   Current  Outpatient Medications:  .  albuterol (VENTOLIN HFA) 108 (90 Base) MCG/ACT inhaler, TAKE 2 PUFFS BY MOUTH 3 TIMES A DAY AS NEEDED FOR SHORTNESS OF BREATH, Disp: , Rfl:  .  ALPRAZolam (XANAX) 0.5 MG tablet, Take 0.5 mg by mouth 4 (four) times daily. , Disp: , Rfl:  .  bumetanide (BUMEX) 1 MG tablet, Take by mouth daily. , Disp: , Rfl:  .  butalbital-acetaminophen-caffeine (FIORICET, ESGIC) 50-325-40 MG tablet, TAKE ONE TABLET BY MOUTH ONCE DAILY, Disp: , Rfl:  .  candesartan (ATACAND) 32 MG tablet, Take 1 tablet (32 mg total) by mouth daily., Disp: 30 tablet, Rfl: 6 .  carvedilol (COREG) 25 MG tablet, Take 37.5-50 mg by mouth 2 (two) times daily with a meal. TAKES 2 TABLETS IN THE MORNING AND 1.5 TABLETS IN THE EVENING, Disp: , Rfl:  .  cloNIDine (CATAPRES - DOSED IN MG/24 HR) 0.3 mg/24hr patch, Place 0.3 mg onto the skin once a week. , Disp: , Rfl:  .  cyclobenzaprine (FLEXERIL) 10 MG tablet, Take 20 mg by mouth at bedtime., Disp: , Rfl:  .  doxazosin (CARDURA) 4 MG tablet, Take 4 mg by mouth at bedtime. , Disp: , Rfl:  .  escitalopram (LEXAPRO) 10 MG tablet, Take 10 mg by mouth daily. , Disp: , Rfl:  .  fluticasone (FLONASE) 50 MCG/ACT nasal spray, PLACE 2 SPRAYS INTO BOTH NOSTRILS 2 (TWO) TIMES DAILY AS NEEDED FOR ALLERGIES, Disp: , Rfl:  .  fluticasone (FLOVENT HFA) 220 MCG/ACT inhaler, TAKE 2 PUFFS BY MOUTH TWICE A DAY, Disp: 12 Inhaler, Rfl: 3 .  hydrALAZINE (APRESOLINE) 25 MG tablet, Take 25 mg by mouth at bedtime. , Disp: , Rfl:  .  Insulin Detemir (LEVEMIR FLEXTOUCH) 100 UNIT/ML Pen, INJECT 90 UNITS SUBCUTANEOUSLY TWICE A DAY, Disp: , Rfl:  .  Insulin Pen Needle (FIFTY50 PEN NEEDLES) 32G X 6 MM MISC, 5 (five) times daily., Disp: , Rfl:  .  insulin regular human CONCENTRATED (HUMULIN R) 500 UNIT/ML kwikpen, Inject 70-75 Units into the skin 3 (three) times daily with meals. PER SLIDING SCALE, Disp: , Rfl:  .  ketoconazole (NIZORAL) 2 % shampoo, APPLY TO AFFECTED AREAS (LEAVE ON FOR 5 MINUTES)  , RINSE WELL, USE 2-3 TIMES PER WEEK., Disp: ,  Rfl: 11 .  lactulose (CHRONULAC) 10 GM/15ML solution, Take 10 g by mouth daily as needed for mild constipation., Disp: , Rfl:  .  levothyroxine (SYNTHROID, LEVOTHROID) 150 MCG tablet, Take 150 mcg by mouth daily before breakfast. , Disp: , Rfl: 11 .  metoCLOPramide (REGLAN) 10 MG tablet, Take 10 mg by mouth 3 (three) times daily., Disp: , Rfl:  .  oxyCODONE-acetaminophen (PERCOCET) 10-325 MG tablet, Take 1 tablet by mouth daily., Disp: 30 tablet, Rfl: 0 .  pantoprazole (PROTONIX) 40 MG tablet, TAKE 1 TABLET (40 MG TOTAL) BY MOUTH 2 (TWO) TIMES DAILY. TAKE ONE HOUR BEFORE A MEAL, Disp: , Rfl:  .  potassium chloride (K-DUR,KLOR-CON) 10 MEQ tablet, TAKE 1 TABLET BY MOUTH ONCE A DAY, Disp: , Rfl:  .  pramipexole (MIRAPEX) 0.5 MG tablet, TAKE 2 TABLETS BY MOUTH AT BEDTIME, Disp: , Rfl:  .  sucralfate (CARAFATE) 1 g tablet, Take 1 tablet by mouth daily., Disp: , Rfl:  .  traMADol (ULTRAM) 50 MG tablet, Take 100 mg by mouth 3 (three) times daily., Disp: , Rfl: 3 .  traZODone (DESYREL) 150 MG tablet, Take 150 mg by mouth at bedtime. , Disp: , Rfl:  .  diclofenac (VOLTAREN) 75 MG EC tablet, Take 1 tablet (75 mg total) by mouth 2 (two) times daily., Disp: 60 tablet, Rfl: 0 .  diclofenac sodium (VOLTAREN) 1 % GEL, Apply 4 g topically as needed (PAIN). , Disp: , Rfl:  .  gabapentin (NEURONTIN) 600 MG tablet, Take 1 tablet (600 mg total) by mouth 6 (six) times daily. (Patient taking differently: Take 600 mg by mouth See admin instructions. ), Disp: 540 tablet, Rfl: 0 .  lidocaine (LIDODERM) 5 %, PLACE 1 PATCH ONTO THE MOST PAINFUL AREA OF SKIN DAILY FOR UP TO 12 HOURS IN A 24 HOUR PERIOD, Disp: , Rfl: 5 .  traMADol (ULTRAM) 50 MG tablet, Take 2 tablets (100 mg total) by mouth every 8 (eight) hours., Disp: 180 tablet, Rfl: 2 .  zolpidem (AMBIEN) 10 MG tablet, Take 10 mg by mouth at bedtime. , Disp: , Rfl:   ROS  Constitutional: Denies any fever or  chills Gastrointestinal: No reported hemesis, hematochezia, vomiting, or acute GI distress Musculoskeletal: Denies any acute onset joint swelling, redness, loss of ROM, or weakness Neurological: No reported episodes of acute onset apraxia, aphasia, dysarthria, agnosia, amnesia, paralysis, loss of coordination, or loss of consciousness  Allergies  Brittney [name] is allergic to eggs or egg-derived products; shellfish allergy; amlodipine; codeine; imdur [isosorbide dinitrate]; lyrica [pregabalin]; monosodium glutamate; and tape.  [place]  Drug: Brittney [name]  reports that she does not use drugs. Alcohol:  reports that she does not drink alcohol. Tobacco:  reports that she has never smoked. She has never used smokeless tobacco. Medical:  has a past medical history of Abdominal pain, left upper quadrant (12/11/2012), Arthritis, Asthma, Broken leg (2014), Cancer ([place]) (2007), Chronic kidney disease, stage 2 (mild), Chronic lower back pain, Collagen vascular disease ([place]), Coronary artery disease, Diabetes mellitus without complication ([place]), Diabetic nephropathy associated with secondary diabetes mellitus ([place]), [place]ushing (12/11/2012), Gastroparesis, Gross hematuria (12/11/2012), Hepatic cirrhosis ([place]), Hyperlipidemia, Hypertension, Hypothyroidism, Hypothyroidism, IBS (irritable bowel syndrome), Kidney stone (12/11/2012), Lower extremity edema, Morbid obesity with BMI of 40.0-44.9, adult ([place]), Myocardial infarction ([place]) (09/2013), Nausea without vomiting (12/11/2012), Nephrolithiasis, Nephrolithiasis (04/26/2014), 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 Surgical: Brittney [name]  has a past surgical history that includes Cesarean section (1980); Dilation and curettage of uterus; Eye  surgery (1986); Abdominal hysterectomy; Tonsillectomy; Esophagogastroduodenoscopy (02/08/2014); Colonoscopy (05/04/2001); Esophagogastroduodenoscopy (egd) with propofol (N/A, 09/20/2016); Colonoscopy with propofol (N/A, 09/20/2016); Mohs surgery (11/2016); Cholecystectomy; Hernia repair; Joint replacement (Left, 09/24/2013); Back surgery (04/1999); Pulse generator implant (N/A, 11/02/2017); and Esophagogastroduodenoscopy (egd) with propofol (N/A, 05/23/2018). Family: family history includes Asthma in her mother; Cancer in her mother; Diabetes in her father; Heart disease in her mother; Hypertension in her father; Kidney disease in her father.  Constitutional Exam  General appearance: Well nourished, well developed, and well hydrated. In no apparent acute distress Vitals:   08/08/18 1255  BP: (!) 177/61  Pulse: 70  Resp: 16  Temp: 98.7 F (37.1 C)  TempSrc: Oral  SpO2: 95%  Weight: 295 lb 1.6 oz (133.9 kg)  Height: 5' 6" (1.676 m)   BMI Assessment: Estimated body mass index is 47.63 kg/m as calculated from the following:   Height as of this encounter: 5' 6" (1.676 m).   Weight as of this encounter: 295 lb 1.6 oz (133.9 kg).  BMI interpretation table: BMI level Category Range association with higher incidence of chronic pain  <18 kg/m2 Underweight   18.5-24.9 kg/m2 Ideal body weight   25-29.9 kg/m2 Overweight Increased incidence by 20%  30-34.9 kg/m2 Obese (Class I) Increased incidence by 68%  35-39.9 kg/m2 Severe obesity (Class II) Increased incidence by 136%  >40 kg/m2 Extreme obesity (Class III) Increased incidence by 254%   Patient's current BMI Ideal Body weight  Body mass index is 47.63 kg/m. Ideal body weight: 59.3 kg (130 lb 11.7 oz) Adjusted ideal body weight: 89.1 kg (196 lb 7.7 oz)   BMI Readings from Last 4 Encounters:  08/08/18 47.63 kg/m  06/09/18 46.32 kg/m  05/25/18 46.00 kg/m  05/24/18 47.40 kg/m   Wt Readings from Last 4 Encounters:  08/08/18 295 lb 1.6 oz  (133.9 kg)  06/09/18 287 lb (130.2 kg)  05/25/18 285 lb (129.3 kg)  05/24/18 293 lb 11.2 oz (133.2 kg)  Psych/Mental status: Alert, oriented x 3 (person, place, & time)       Eyes: PERLA Respiratory: No evidence of acute respiratory distress  Cervical Spine Area Exam  Skin & Axial Inspection: No masses, redness, edema, swelling, or associated skin lesions Alignment: Symmetrical Functional ROM: Unrestricted ROM      Stability: No instability detected Muscle Tone/Strength: Functionally intact. No obvious neuro-muscular anomalies detected. Sensory (Neurological): Unimpaired Palpation: No palpable anomalies              Upper Extremity (UE) Exam    Side: Right upper extremity  Side: Left upper extremity  Skin & Extremity Inspection: Skin color, temperature, and hair growth are WNL. No peripheral edema or cyanosis. No masses, redness, swelling, asymmetry, or associated skin lesions. No contractures.  Skin & Extremity Inspection: Skin color, temperature, and hair growth are WNL. No peripheral edema or cyanosis. No masses, redness, swelling, asymmetry, or associated skin lesions. No contractures.  Functional ROM: Unrestricted ROM          Functional ROM: Unrestricted ROM          Muscle Tone/Strength: Functionally intact. No obvious neuro-muscular anomalies detected.  Muscle Tone/Strength: Functionally intact. No obvious neuro-muscular anomalies detected.  Sensory (Neurological): Unimpaired          Sensory (Neurological): Unimpaired          Palpation: No palpable anomalies  Palpation: No palpable anomalies              Provocative Test(s):  Phalen's test: deferred Tinel's test: deferred Apley's scratch test (touch opposite shoulder):  Action 1 (Across chest): deferred Action 2 (Overhead): deferred Action 3 (LB reach): deferred   Provocative Test(s):  Phalen's test: deferred Tinel's test: deferred Apley's scratch test (touch opposite shoulder):  Action 1 (Across chest):  deferred Action 2 (Overhead): deferred Action 3 (LB reach): deferred    Thoracic Spine Area Exam  Skin & Axial Inspection: No masses, redness, or swelling Alignment: Symmetrical Functional ROM: Unrestricted ROM Stability: No instability detected Muscle Tone/Strength: Functionally intact. No obvious neuro-muscular anomalies detected. Sensory (Neurological): Unimpaired Muscle strength & Tone: No palpable anomalies   Lumbar Spine Area Exam  Skin & Axial Inspection:Well healed scar from previous spine surgery detected Alignment:Symmetrical Functional YKZ:[id number] ROM Stability:No instability detected Muscle Tone/Strength:Functionally intact. No obvious neuro-muscular anomalies detected. Sensory (Neurological):Dermatomal pain pattern Palpation:Complains of area being tender to palpationBilateral Fist Percussion Test Provocative Tests: Lumbar Hyperextension and rotation test:Positivebilaterally for facet joint pain.R>L Lumbar Lateral bending test:Positivedue to fusion restriction. Patrick's Maneuver:evaluation deferred today  Gait & Posture Assessment  Ambulation:Patient ambulates using a walker Gait:Limited. Using assistive device to ambulate Posture:Difficulty standing up straight, due to pain  Lower Extremity Exam    Side:Right lower extremity  Side:Left lower extremity  Skin & Extremity Inspection:Skin color, temperature, and hair growth are WNL. No peripheral edema or cyanosis. No masses, redness, swelling, asymmetry, or associated skin lesions. No contractures.  Skin & Extremity Inspection:Skin color, temperature, and hair growth are WNL. No peripheral edema or cyanosis. No masses, redness, swelling, asymmetry, or associated skin lesions. No contractures.  Functional LTJ:[id number] ROM  Functional QTM:[id number] ROM  Muscle Tone/Strength:Functionally intact. No obvious neuro-muscular anomalies detected.  Muscle  Tone/Strength:Functionally intact. No obvious neuro-muscular anomalies detected.  Sensory (Neurological):Paresthesia (Tingling sensation)  Sensory (Neurological):Paresthesia (Burning sensation)  Palpation:No palpable anomalies  Palpation:No palpable anomalies  4 out of 5 strength right plantarflexion, dorsiflexion, hip flexion, knee extension.   Assessment  Primary Diagnosis & Pertinent Problem List: The primary encounter diagnosis was Failed back surgical syndrome. Diagnoses of Status post lumbar spine surgery for decompression of spinal cord, L3-4 severe lumbar facet hypertrophy and spinal stenosis, Fibromyalgia, L3-4 severe lumbar spinal stenosis (12/31/2016 MRI), and Spinal stenosis of thoracic region were also pertinent to this visit.  Status Diagnosis  Persistent Persistent Persistent 1. Failed back surgical syndrome   2. Status post lumbar spine surgery for decompression of spinal cord   3. L3-4 severe lumbar facet hypertrophy and spinal stenosis   4. Fibromyalgia   5. L3-4 severe lumbar spinal stenosis (12/31/2016 MRI)   6. Spinal stenosis of thoracic region      67 year old female with multiple medical issues with a history of failed back surgical syndrome status post NEVRO spinal cord stimulator implant who presents with worsening leg pain left greater than right.  Patient is endorsing heaviness in her legs.  She states that she turned her spinal cord stimulator off for a couple of days and did not notice any difference in her pain than when she had it on.  Patient also has a history of fibromyalgia, liver cirrhosis, type 2 diabetes with A1c greater than 8 along with severe anxiety.  She also has iron deficiency anemia.  Could all be contributing to her chronic fatigue and chronic pain.  Today I will obtain an x-ray of her lumbar spine to evaluate for lead fracture versus lead migration.  If this is negative, we may consider lumbar spine CT to evaluate for worsening foraminal  pathology or spinal pathology.  I recommended the patient take a break from aquatic therapy since this is been causing her worsening pain.  Given that her A1c is elevated, we will avoid a steroid taper at this time although I think it could be beneficial.  I have instructed the patient to discontinue her ibuprofen and will trial diclofenac 75 mg twice daily.  I will see the patient back in approximately 1 month.   Plan of Care  Pharmacotherapy (Medications Ordered): Meds ordered this encounter  Medications  . diclofenac (VOLTAREN) 75 MG EC tablet    Sig: Take 1 tablet (75 mg total) by mouth 2 (two) times daily.    Dispense:  60 tablet    Refill:  0   Lab-work, procedure(s), and/or referral(s): Orders Placed This Encounter  Procedures  . DG Lumbar Spine Complete W/Bend    Time Note: Greater than 50% of the 25 minute(s) of face-to-face time spent with Brittney [name], was spent in counseling/coordination of care regarding: Brittney [name] primary cause of pain, the treatment plan, realistic expectations, the goals of pain management (increased in functionality) and the need to bring and keep the BMI below 30.  Provider-requested follow-up: Return in about 4 weeks (around 09/05/2018) for Medication Management, After Imaging.  Future Appointments  Date Time Provider [place]  09/07/2018  1:45 PM [name], MD [facility] None  09/26/2018 12:45 PM [facility] None  09/26/2018  1:00 PM Lloyd Huger, MD [facility] None  09/26/2018  1:30 PM [facility] INFUSION CHAIR 3 [facility] None  10/13/2018  8:00 AM [facility]    Primary Care Physician: Rusty Aus, MD Location: [facility] Note by: [name], M.D Date: 08/08/2018; Time: 1:59 PM  There are no Patient Instructions on file for this visit.

## 2018-08-15 ENCOUNTER — Encounter: Payer: Medicare Other | Admitting: Physical Therapy

## 2018-08-21 ENCOUNTER — Telehealth (INDEPENDENT_AMBULATORY_CARE_PROVIDER_SITE_OTHER): Payer: Self-pay

## 2018-08-21 NOTE — Telephone Encounter (Signed)
Patient called to state that her Lymphedema machine still works but her boots that goes with the machine, no longer inflate and that they are falling apart.  She would like to know how does she go about getting a new machine? Will she need to be seen here in the office for an appointment? And she would like to know what to do about her ankle swelling in the meantime?

## 2018-08-21 NOTE — Telephone Encounter (Signed)
Typically lymphedema pumps are replaced every 6 to 7 years.  She would need a follow-up with a provider before we are able to submit to insurance for another pump.  In the meantime, she can continue to utilize her medical grade 1 compression stockings, as well as elevation and NSAIDs as necessary for pain.

## 2018-08-22 ENCOUNTER — Encounter: Payer: Medicare Other | Admitting: Physical Therapy

## 2018-08-25 NOTE — Telephone Encounter (Signed)
Patient is scheduled to come in to the office on 9/5 so that she can be seen and that we can resubmit for a new Lymph pump.

## 2018-08-25 NOTE — Telephone Encounter (Signed)
I agree The actual pump is replaced every 5-7 years  Sometimes the sleeves are replaced every 3 years

## 2018-08-27 DIAGNOSIS — C4490 Unspecified malignant neoplasm of skin, unspecified: Secondary | ICD-10-CM

## 2018-08-27 HISTORY — DX: Unspecified malignant neoplasm of skin, unspecified: C44.90

## 2018-08-29 ENCOUNTER — Encounter: Payer: Medicare Other | Admitting: Physical Therapy

## 2018-08-31 ENCOUNTER — Ambulatory Visit (INDEPENDENT_AMBULATORY_CARE_PROVIDER_SITE_OTHER): Payer: Medicare Other | Admitting: Vascular Surgery

## 2018-08-31 ENCOUNTER — Encounter (INDEPENDENT_AMBULATORY_CARE_PROVIDER_SITE_OTHER): Payer: Self-pay | Admitting: Vascular Surgery

## 2018-08-31 VITALS — BP 187/92 | HR 86 | Resp 16 | Ht 66.0 in | Wt 299.6 lb

## 2018-08-31 DIAGNOSIS — I890 Lymphedema, not elsewhere classified: Secondary | ICD-10-CM | POA: Diagnosis not present

## 2018-08-31 DIAGNOSIS — E1151 Type 2 diabetes mellitus with diabetic peripheral angiopathy without gangrene: Secondary | ICD-10-CM | POA: Diagnosis not present

## 2018-08-31 DIAGNOSIS — I10 Essential (primary) hypertension: Secondary | ICD-10-CM | POA: Diagnosis not present

## 2018-08-31 DIAGNOSIS — I872 Venous insufficiency (chronic) (peripheral): Secondary | ICD-10-CM | POA: Diagnosis not present

## 2018-08-31 DIAGNOSIS — M159 Polyosteoarthritis, unspecified: Secondary | ICD-10-CM

## 2018-09-03 ENCOUNTER — Encounter (INDEPENDENT_AMBULATORY_CARE_PROVIDER_SITE_OTHER): Payer: Self-pay | Admitting: Vascular Surgery

## 2018-09-03 DIAGNOSIS — I878 Other specified disorders of veins: Secondary | ICD-10-CM | POA: Insufficient documentation

## 2018-09-03 DIAGNOSIS — I890 Lymphedema, not elsewhere classified: Secondary | ICD-10-CM | POA: Insufficient documentation

## 2018-09-03 DIAGNOSIS — I872 Venous insufficiency (chronic) (peripheral): Secondary | ICD-10-CM | POA: Insufficient documentation

## 2018-09-03 NOTE — Progress Notes (Signed)
MRN : [id number]  Brittney [name] is a 67 y.o. (08/01/1951) female who presents with chief complaint of  Chief Complaint  Patient presents with  . Follow-up    Discuss Lymphedema pump  .  History of Present Illness: The patient returns to the office for followup evaluation regarding leg swelling.  The swelling has persisted and the pain associated with swelling continues. There have not been any interval development of a ulcerations or wounds.  Since the previous visit the patient has been wearing graduated compression stockings and has noted little if any improvement in the lymphedema. The patient has been using compression routinely morning until night.  She has a pump but it broke a year ago and so she has not been able to use it.  The patient also states elevation during the day and exercise is being done too.   Current Meds  Medication Sig  . albuterol (VENTOLIN HFA) 108 (90 Base) MCG/ACT inhaler TAKE 2 PUFFS BY MOUTH 3 TIMES A DAY AS NEEDED FOR SHORTNESS OF BREATH  . ALPRAZolam (XANAX) 0.5 MG tablet Take 0.5 mg by mouth 4 (four) times daily.   . bumetanide (BUMEX) 1 MG tablet Take by mouth daily.   . butalbital-acetaminophen-caffeine (FIORICET, ESGIC) 50-325-40 MG tablet TAKE ONE TABLET BY MOUTH ONCE DAILY  . candesartan (ATACAND) 32 MG tablet Take 1 tablet (32 mg total) by mouth daily.  . carvedilol (COREG) 25 MG tablet Take 37.5-50 mg by mouth 2 (two) times daily with a meal. TAKES 2 TABLETS IN THE MORNING AND 1.5 TABLETS IN THE EVENING  . cloNIDine (CATAPRES - DOSED IN MG/24 HR) 0.3 mg/24hr patch Place 0.3 mg onto the skin once a week.   . cyclobenzaprine (FLEXERIL) 10 MG tablet Take 20 mg by mouth at bedtime.  . diclofenac (VOLTAREN) 75 MG EC tablet Take 1 tablet (75 mg total) by mouth 2 (two) times daily.  . diclofenac sodium (VOLTAREN) 1 % GEL Apply 4 g topically as needed (PAIN).   Marland Kitchen doxazosin (CARDURA) 4 MG tablet Take 4 mg by mouth at bedtime.   Marland Kitchen escitalopram  (LEXAPRO) 10 MG tablet Take 10 mg by mouth daily.   . fluticasone (FLONASE) 50 MCG/ACT nasal spray PLACE 2 SPRAYS INTO BOTH NOSTRILS 2 (TWO) TIMES DAILY AS NEEDED FOR ALLERGIES  . fluticasone (FLOVENT HFA) 220 MCG/ACT inhaler TAKE 2 PUFFS BY MOUTH TWICE A DAY  . hydrALAZINE (APRESOLINE) 25 MG tablet Take 25 mg by mouth at bedtime.   . Insulin Detemir (LEVEMIR FLEXTOUCH) 100 UNIT/ML Pen INJECT 90 UNITS SUBCUTANEOUSLY TWICE A DAY  . Insulin Pen Needle (FIFTY50 PEN NEEDLES) 32G X 6 MM MISC 5 (five) times daily.  . insulin regular human CONCENTRATED (HUMULIN R) 500 UNIT/ML kwikpen Inject 70-75 Units into the skin 3 (three) times daily with meals. PER SLIDING SCALE  . ketoconazole (NIZORAL) 2 % shampoo APPLY TO AFFECTED AREAS (LEAVE ON FOR 5 MINUTES) , RINSE WELL, USE 2-3 TIMES PER WEEK.  Marland Kitchen lactulose (CHRONULAC) 10 GM/15ML solution Take 10 g by mouth daily as needed for mild constipation.  Marland Kitchen levothyroxine (SYNTHROID, LEVOTHROID) 150 MCG tablet Take 150 mcg by mouth daily before breakfast.   . lidocaine (LIDODERM) 5 % PLACE 1 PATCH ONTO THE MOST PAINFUL AREA OF SKIN DAILY FOR UP TO 12 HOURS IN A 24 HOUR PERIOD  . metoCLOPramide (REGLAN) 10 MG tablet Take 10 mg by mouth 3 (three) times daily.  Marland Kitchen oxyCODONE-acetaminophen (PERCOCET) 10-325 MG tablet Take 1 tablet by mouth  daily.  . pantoprazole (PROTONIX) 40 MG tablet TAKE 1 TABLET (40 MG TOTAL) BY MOUTH 2 (TWO) TIMES DAILY. TAKE ONE HOUR BEFORE A MEAL  . potassium chloride (K-DUR,KLOR-CON) 10 MEQ tablet TAKE 1 TABLET BY MOUTH ONCE A DAY  . pramipexole (MIRAPEX) 0.5 MG tablet TAKE 2 TABLETS BY MOUTH AT BEDTIME  . sucralfate (CARAFATE) 1 g tablet Take 1 tablet by mouth daily.  . traMADol (ULTRAM) 50 MG tablet Take 100 mg by mouth 3 (three) times daily.  . traZODone (DESYREL) 150 MG tablet Take 150 mg by mouth at bedtime.   Marland Kitchen zolpidem (AMBIEN) 10 MG tablet Take 10 mg by mouth at bedtime.     Past Medical History:  Diagnosis Date  . Abdominal pain, left  upper quadrant 12/11/2012  . Arthritis   . Asthma   . Broken leg 2014   fell twice and broke same leg. had a bone stimulator for first break.  . Cancer ([place]) 2007   melanoma, left ear and back of left leg, removed from back  . Chronic kidney disease, stage 2 (mild)   . Chronic lower back pain   . Collagen vascular disease ([place])   . Coronary artery disease   . Diabetes mellitus without complication ([place])   . Diabetic nephropathy associated with secondary diabetes mellitus ([place])   . [place] 12/11/2012  . Gastroparesis   . Gross hematuria 12/11/2012  . Hepatic cirrhosis ([place])   . Hyperlipidemia   . Hypertension   . Hypothyroidism   . Hypothyroidism   . IBS (irritable bowel syndrome)   . Kidney stone 12/11/2012  . Lower extremity edema   . Morbid obesity with BMI of 40.0-44.9, adult ([place])   . Myocardial infarction ([place]) 09/2013  . Nausea without vomiting 12/11/2012  . Nephrolithiasis   . Nephrolithiasis 04/26/2014  . Nonproliferative retinopathy due to secondary diabetes ([place])   . Numbness and tingling of right leg   . Peripheral vascular disease ([place])   . Renal colic 50/93/2671  . Sciatica   . Sciatica 12/11/2012  . Skin cancer   . Sleep apnea    sleep study coming in november  . Unilateral small kidney without contralateral hypertrophy     Past Surgical History:  Procedure Laterality Date  . ABDOMINAL HYSTERECTOMY    . BACK SURGERY  04/1999   L-4-5 LAMINECTOMY. metal in lower back and neck  . CESAREAN SECTION  1980  . CHOLECYSTECTOMY    . COLONOSCOPY  05/04/2001  . COLONOSCOPY WITH PROPOFOL N/A 09/20/2016   Procedure: COLONOSCOPY WITH PROPOFOL;  Surgeon: Lollie Sails, MD;  Location: [facility] ENDOSCOPY;  Service: Endoscopy;  Laterality: N/A;  . DILATION AND CURETTAGE OF UTERUS    . ESOPHAGOGASTRODUODENOSCOPY  02/08/2014  . ESOPHAGOGASTRODUODENOSCOPY (EGD) WITH PROPOFOL N/A 09/20/2016   Procedure: ESOPHAGOGASTRODUODENOSCOPY (EGD) WITH PROPOFOL;  Surgeon: Lollie Sails, MD;  Location[facility] ENDOSCOPY;  Service: Endoscopy;  Laterality: N/A;  . ESOPHAGOGASTRODUODENOSCOPY (EGD) WITH PROPOFOL N/A 05/23/2018   Procedure: ESOPHAGOGASTRODUODENOSCOPY (EGD) WITH PROPOFOL;  Surgeon: Lollie Sails, MD;  Location: [facility] ENDOSCOPY;  Service: Endoscopy;  Laterality: N/A;  . EYE SURGERY  1986   FOR MELANOMA  . HERNIA REPAIR    . JOINT REPLACEMENT Left 09/24/2013   TOTAL KNEE  . MOHS SURGERY  11/2016   left side of nose  . PULSE GENERATOR IMPLANT N/A 11/02/2017   Procedure: UNILATERAL PULSE GENERATOR IMPLANT;  Surgeon: Meade Maw, MD;  Location: [facility] ORS;  Service: Neurosurgery;  Laterality: N/A;  . TONSILLECTOMY  WITH ADENOIDECTOMY    Social History Social History   Tobacco Use  . Smoking status: Never Smoker  . Smokeless tobacco: Never Used  Substance Use Topics  . Alcohol use: No  . Drug use: No    Family History Family History  Problem Relation Age of Onset  . Heart disease Mother   . Cancer Mother   . Asthma Mother   . Diabetes Father   . Kidney disease Father   . Hypertension Father   . Breast cancer Neg Hx     Allergies  Allergen Reactions  . Eggs Or Egg-Derived Products Nausea And Vomiting    Only has reaction to flu vaccine due to eggs, no reaction to egg food products  . Shellfish Allergy Nausea And Vomiting, Diarrhea and Nausea Only    Non stop sickness once ingests shellfish. Betadine is okay  . Amlodipine Cough  . Codeine Nausea And Vomiting and Nausea Only    states she can take codeine cough syrup without problems states she can take codeine cough syrup without problems  . Imdur [Isosorbide Dinitrate] Other (See Comments)    Headache  . Lyrica [Pregabalin] Swelling and Other (See Comments)    "FLU-LIKE" SYMPTOMS  . Monosodium Glutamate Other (See Comments)    Headache  . Tape Other (See Comments) and Nausea And Vomiting    can use paper tape Redness     REVIEW OF SYSTEMS (Negative unless  checked)  Constitutional: [] Weight loss  [] Fever  [] Chills Cardiac: [] Chest pain   [] Chest pressure   [] Palpitations   [] Shortness of breath when laying flat   [] Shortness of breath with exertion. Vascular:  [] Pain in legs with walking   [] Pain in legs at rest  [] History of DVT   [] Phlebitis   [x] Swelling in legs   [] Varicose veins   [] Non-healing ulcers Pulmonary:   [] Uses home oxygen   [] Productive cough   [] Hemoptysis   [] Wheeze  [] COPD   [] Asthma Neurologic:  [] Dizziness   [] Seizures   [] History of stroke   [] History of TIA  [] Aphasia   [] Vissual changes   [] Weakness or numbness in arm   [] Weakness or numbness in leg Musculoskeletal:   [] Joint swelling   [] Joint pain   [] Low back pain Hematologic:  [] Easy bruising  [] Easy bleeding   [] Hypercoagulable state   [] Anemic Gastrointestinal:  [] Diarrhea   [] Vomiting  [] Gastroesophageal reflux/heartburn   [] Difficulty swallowing. Genitourinary:  [] Chronic kidney disease   [] Difficult urination  [] Frequent urination   [] Blood in urine Skin:  [] Rashes   [] Ulcers  Psychological:  [] History of anxiety   []  History of major depression.  Physical Examination  Vitals:   08/31/18 1614  BP: (!) 187/92  Pulse: 86  Resp: 16  Weight: 299 lb 9.6 oz (135.9 kg)  Height: 5\' 6"  (1.676 m)   Body mass index is 48.36 kg/m. Gen: WD/WN, NAD Head: [place]/AT, No temporalis wasting.  Ear/Nose/Throat: Hearing grossly intact, nares w/o erythema or drainage Eyes: PER, EOMI, sclera nonicteric.  Neck: Supple, no large masses.   Pulmonary:  Good air movement, no audible wheezing bilaterally, no use of accessory muscles.  Cardiac: RRR, no JVD Vascular: scattered varicosities present bilaterally.  Severe venous stasis changes to the legs bilaterally.  3+ soft pitting edema Vessel Right Left  Radial Palpable Palpable  PT Palpable Palpable  DP Palpable Palpable  Gastrointestinal: Non-distended. No guarding/no peritoneal signs.  Musculoskeletal: M/S 5/5 throughout.  No  deformity or atrophy.  Neurologic: CN 2-12 intact. Symmetrical.  Speech  is fluent. Motor exam as listed above. Psychiatric: Judgment intact, Mood & affect appropriate for pt's clinical situation. Dermatologic: Severe venous rashes no ulcers noted.  No changes consistent with cellulitis. Lymph : No lichenification or skin changes of chronic lymphedema.  CBC Lab Results  Component Value Date   WBC 9.1 05/24/2018   HGB 12.3 05/24/2018   HCT 36.5 05/24/2018   MCV 86.4 05/24/2018   PLT 172 05/24/2018    BMET    Component Value Date/Time   NA 137 10/28/2017 0905   NA 139 10/23/2014 0011   K 3.8 10/28/2017 0905   K 4.1 10/23/2014 0011   CL 101 10/28/2017 0905   CL 101 10/23/2014 0011   CO2 28 10/28/2017 0905   CO2 29 10/23/2014 0011   GLUCOSE 204 (H) 10/28/2017 0905   GLUCOSE 250 (H) 10/23/2014 0011   BUN 14 10/28/2017 0905   BUN 17 10/23/2014 0011   CREATININE 0.60 10/28/2017 0905   CREATININE 0.99 10/23/2014 0011   CALCIUM 9.5 10/28/2017 0905   CALCIUM 8.5 10/23/2014 0011   GFRNONAA >60 10/28/2017 0905   GFRNONAA >60 10/23/2014 0011   GFRNONAA >60 09/26/2013 0550   GFRAA >60 10/28/2017 0905   GFRAA >60 10/23/2014 0011   GFRAA >60 09/26/2013 0550   CrCl cannot be calculated (Patient's most recent lab result is older than the maximum 21 days allowed.).  COAG Lab Results  Component Value Date   INR 0.95 05/23/2018   INR 0.95 09/20/2016   INR 1.0 10/22/2014    Radiology Dg Lumbar Spine Complete W/bend  Result Date: 08/09/2018 CLINICAL DATA:  Chronic back pain. EXAM: LUMBAR SPINE - COMPLETE WITH BENDING VIEWS COMPARISON:  Lumbar spine MRI 03/15/2017 FINDINGS: Stable appearing lumbar fusion hardware with pedicle screws and posterior rods. There is also a lateral plate and screws at D3-2. No obvious complicating features associated with the hardware. A spinal cord stimulator is noted in the lower thoracic region. No acute bony findings or destructive bony changes. No  instability or subluxation with flexion/extension. Limited range of motion is noted. Advanced atherosclerotic calcifications involving the aorta and iliac arteries without definite aneurysm. IMPRESSION: Lower lumbar fusion hardware appears stable. No obvious complicating features. No acute bony findings or destructive bony changes. Spinal cord stimulator appears to be in good position. Electronically Signed   By: Marijo Sanes M.D.   On: 08/09/2018 08:18     Assessment/Plan 1. Lymphedema Recommend:  No surgery or intervention at this point in time.    I have reviewed my previous discussion with the patient regarding swelling and why it causes symptoms.  Patient will continue wearing graduated compression stockings class 1 (20-30 mmHg) on a daily basis. The patient will  beginning wearing the stockings first thing in the morning and removing them in the evening. The patient is instructed specifically not to sleep in the stockings.    In addition, behavioral modification including several periods of elevation of the lower extremities during the day will be continued.  This was reviewed with the patient during the initial visit.  The patient will also continue routine exercise, especially walking on a daily basis as was discussed during the initial visit.    Despite conservative treatments including graduated compression therapy class 1 and behavioral modification including exercise and elevation the patient  has not obtained adequate control of the lymphedema.  The patient still has stage 3 lymphedema and therefore, I believe that a lymph pump should be added to improve the control of  the patient's lymphedema.  Additionally, a lymph pump is warranted because it will reduce the risk of cellulitis and ulceration in the future.  Patient should follow-up in six months    2. Chronic venous insufficiency Recommend:  No surgery or intervention at this point in time.    I have reviewed my previous  discussion with the patient regarding swelling and why it causes symptoms.  Patient will continue wearing graduated compression stockings class 1 (20-30 mmHg) on a daily basis. The patient will  beginning wearing the stockings first thing in the morning and removing them in the evening. The patient is instructed specifically not to sleep in the stockings.    In addition, behavioral modification including several periods of elevation of the lower extremities during the day will be continued.  This was reviewed with the patient during the initial visit.  The patient will also continue routine exercise, especially walking on a daily basis as was discussed during the initial visit.    Despite conservative treatments including graduated compression therapy class 1 and behavioral modification including exercise and elevation the patient  has not obtained adequate control of the lymphedema.  The patient still has stage 3 lymphedema and therefore, I believe that a lymph pump should be added to improve the control of the patient's lymphedema.  Additionally, a lymph pump is warranted because it will reduce the risk of cellulitis and ulceration in the future.  Patient should follow-up in six months    3. Essential hypertension Continue antihypertensive medications as already ordered, these medications have been reviewed and there are no changes at this time.   4. Diabetes mellitus with peripheral vascular disease ([place]) Continue hypoglycemic medications as already ordered, these medications have been reviewed and there are no changes at this time.  Hgb A1C to be monitored as already arranged by primary service   5. Generalized osteoarthritis of multiple sites Continue NSAID medications as already ordered, these medications have been reviewed and there are no changes at this time.  Continued activity and therapy was stressed.     Hortencia Pilar, MD  09/03/2018 3:12 PM

## 2018-09-05 ENCOUNTER — Encounter: Payer: Medicare Other | Admitting: Physical Therapy

## 2018-09-07 ENCOUNTER — Encounter: Payer: Self-pay | Admitting: Student in an Organized Health Care Education/Training Program

## 2018-09-07 ENCOUNTER — Other Ambulatory Visit: Payer: Self-pay

## 2018-09-07 ENCOUNTER — Ambulatory Visit
Payer: Medicare Other | Attending: Student in an Organized Health Care Education/Training Program | Admitting: Student in an Organized Health Care Education/Training Program

## 2018-09-07 VITALS — BP 156/68 | HR 69 | Temp 98.7°F | Resp 18 | Ht 66.0 in | Wt 290.0 lb

## 2018-09-07 DIAGNOSIS — M47816 Spondylosis without myelopathy or radiculopathy, lumbar region: Secondary | ICD-10-CM | POA: Diagnosis not present

## 2018-09-07 DIAGNOSIS — M797 Fibromyalgia: Secondary | ICD-10-CM | POA: Insufficient documentation

## 2018-09-07 DIAGNOSIS — E1143 Type 2 diabetes mellitus with diabetic autonomic (poly)neuropathy: Secondary | ICD-10-CM | POA: Diagnosis not present

## 2018-09-07 DIAGNOSIS — Z6841 Body Mass Index (BMI) 40.0 and over, adult: Secondary | ICD-10-CM | POA: Insufficient documentation

## 2018-09-07 DIAGNOSIS — N182 Chronic kidney disease, stage 2 (mild): Secondary | ICD-10-CM | POA: Diagnosis not present

## 2018-09-07 DIAGNOSIS — M961 Postlaminectomy syndrome, not elsewhere classified: Secondary | ICD-10-CM

## 2018-09-07 DIAGNOSIS — M1711 Unilateral primary osteoarthritis, right knee: Secondary | ICD-10-CM | POA: Diagnosis not present

## 2018-09-07 DIAGNOSIS — M533 Sacrococcygeal disorders, not elsewhere classified: Secondary | ICD-10-CM | POA: Diagnosis not present

## 2018-09-07 DIAGNOSIS — Z794 Long term (current) use of insulin: Secondary | ICD-10-CM | POA: Diagnosis not present

## 2018-09-07 DIAGNOSIS — Z79899 Other long term (current) drug therapy: Secondary | ICD-10-CM | POA: Diagnosis not present

## 2018-09-07 DIAGNOSIS — G894 Chronic pain syndrome: Secondary | ICD-10-CM | POA: Insufficient documentation

## 2018-09-07 DIAGNOSIS — M48061 Spinal stenosis, lumbar region without neurogenic claudication: Secondary | ICD-10-CM | POA: Diagnosis not present

## 2018-09-07 DIAGNOSIS — Z79891 Long term (current) use of opiate analgesic: Secondary | ICD-10-CM | POA: Diagnosis not present

## 2018-09-07 DIAGNOSIS — E11319 Type 2 diabetes mellitus with unspecified diabetic retinopathy without macular edema: Secondary | ICD-10-CM | POA: Diagnosis not present

## 2018-09-07 DIAGNOSIS — M7918 Myalgia, other site: Secondary | ICD-10-CM

## 2018-09-07 DIAGNOSIS — I129 Hypertensive chronic kidney disease with stage 1 through stage 4 chronic kidney disease, or unspecified chronic kidney disease: Secondary | ICD-10-CM | POA: Insufficient documentation

## 2018-09-07 DIAGNOSIS — K3184 Gastroparesis: Secondary | ICD-10-CM | POA: Diagnosis not present

## 2018-09-07 DIAGNOSIS — M4804 Spinal stenosis, thoracic region: Secondary | ICD-10-CM

## 2018-09-07 DIAGNOSIS — M5416 Radiculopathy, lumbar region: Secondary | ICD-10-CM

## 2018-09-07 DIAGNOSIS — G4733 Obstructive sleep apnea (adult) (pediatric): Secondary | ICD-10-CM | POA: Diagnosis not present

## 2018-09-07 DIAGNOSIS — E1151 Type 2 diabetes mellitus with diabetic peripheral angiopathy without gangrene: Secondary | ICD-10-CM | POA: Diagnosis not present

## 2018-09-07 DIAGNOSIS — E1122 Type 2 diabetes mellitus with diabetic chronic kidney disease: Secondary | ICD-10-CM | POA: Insufficient documentation

## 2018-09-07 DIAGNOSIS — G8929 Other chronic pain: Secondary | ICD-10-CM

## 2018-09-07 DIAGNOSIS — M79605 Pain in left leg: Secondary | ICD-10-CM

## 2018-09-07 DIAGNOSIS — Z5181 Encounter for therapeutic drug level monitoring: Secondary | ICD-10-CM | POA: Diagnosis present

## 2018-09-07 DIAGNOSIS — Z9889 Other specified postprocedural states: Secondary | ICD-10-CM | POA: Diagnosis not present

## 2018-09-07 DIAGNOSIS — Z9689 Presence of other specified functional implants: Secondary | ICD-10-CM | POA: Diagnosis not present

## 2018-09-07 DIAGNOSIS — E785 Hyperlipidemia, unspecified: Secondary | ICD-10-CM | POA: Diagnosis not present

## 2018-09-07 DIAGNOSIS — M79604 Pain in right leg: Secondary | ICD-10-CM

## 2018-09-07 DIAGNOSIS — M792 Neuralgia and neuritis, unspecified: Secondary | ICD-10-CM

## 2018-09-07 MED ORDER — BUPRENORPHINE 7.5 MCG/HR TD PTWK: 7.5000 ug/h | MEDICATED_PATCH | TRANSDERMAL | 1 refills | Status: DC

## 2018-09-07 NOTE — Progress Notes (Signed)
Patient's Name: Brittney [name]  MRN: [id number]  Referring Provider: Rusty Aus, MD  DOB: 1951/04/29  PCP: Rusty Aus, MD  DOS: 09/07/2018  Note by: [name], MD  Service setting: Ambulatory outpatient  Specialty: Interventional Pain Management  Location: [facility] (AMB) Pain Management Facility    Patient type: Established   Primary Reason(s) for Visit: Encounter for prescription drug management. (Level of risk: moderate)  CC: Back Pain (lower, left side is worse)  HPI  Brittney [name] is a 67 y.o. year old, female patient, who comes today for a medication management evaluation. She has Abnormal tumor markers; Chronic kidney disease, stage II (mild); Degenerative spondylolisthesis; Diabetes mellitus with peripheral vascular disease ([place]); Diabetes with retinopathy ([place]); Diabetic nephropathy ([place]); DM (diabetes mellitus) type II uncontrolled, periph vascular disorder ([place]); Fibromyalgia; Gastroparesis due to DM ([place]); Generalized osteoarthritis of multiple sites; Hepatic cirrhosis ([place]); Hyperlipemia; Lung nodule, multiple; Microalbuminuria; OSA (obstructive sleep apnea); Seronegative arthritis; Essential hypertension; L3-4 severe lumbar spinal stenosis (12/31/2016 MRI); Type 2 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, with long-term current use of insulin (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); Chronic lower extremity pain (Location of Secondary source of pain) (Bilateral) (R>L); Chronic knee pain (Location of Tertiary source of pain) (Right); Osteoarthritis of knee (Right);  Grade 1 Anterolisthesis of L3 over L4 and L4 over L5; Osteoarthritis of sacroiliac joint (Right); L3-4 severe lumbar facet hypertrophy and spinal stenosis; Neurogenic pain; Long term prescription benzodiazepine use; Vitamin D insufficiency; Iron deficiency anemia; Back pain; Status post lumbar spine surgery for decompression of spinal cord; Myofascial pain; Spinal stenosis of thoracic region; Lymphedema; and Chronic venous insufficiency on their problem list. Her primarily concern today is the Back Pain (lower, left side is worse)  Pain Assessment: Location: Lower Back Radiating: left side is worse; "legs hurt when walking" Onset: More than a month ago Duration: Chronic pain Quality: Constant Severity: 7 /10 (subjective, self-reported pain score)  Note: Reported level is compatible with observation.                         When using our objective Pain Scale, levels between 6 and 10/10 are said to belong in an emergency room, as it progressively worsens from a 6/10, described as severely limiting, requiring emergency care not usually available at an outpatient pain management facility. At a 6/10 level, communication becomes difficult and requires great effort. Assistance to reach the emergency department may be required. Facial flushing and profuse sweating along with potentially dangerous increases in heart rate and blood pressure will be evident. Effect on ADL: unable to stand up straight Timing: Constant Modifying factors: laying down, meds BP: (!) 156/68  HR: 69  Brittney [name] was last scheduled for an appointment on 08/08/2018 for medication management. During today's appointment we reviewed Brittney [name]'s chronic pain status, as well as her outpatient medication regimen.  The patient  reports that she does not use drugs. Her body mass index is 46.81 kg/m.  Further details on both, my assessment(s), as well as the proposed treatment plan, please see below.  Controlled Substance  Pharmacotherapy Assessment REMS (Risk  Evaluation and Mitigation Strategy)  Analgesic: Percocet 10 mg, quantity 30, last filled 03/30/2018.  Has been utilizing same prescription.  21 tablets left. Rise Patience, RN  09/07/2018  1:55 PM  Signed Nursing Pain Medication Assessment:  Safety precautions to be maintained throughout the outpatient stay will include: orient to surroundings, keep bed in low position, maintain call bell within reach at all times, provide assistance with transfer out of bed and ambulation.  Medication Inspection Compliance: Pill count conducted under aseptic conditions, in front of the patient. Neither the pills nor the bottle was removed from the patient's sight at any time. Once count was completed pills were immediately returned to the patient in their original bottle.  Medication: Oxycodone/APAP Pill/Patch Count: 21 of 30 pills remain Pill/Patch Appearance: Markings consistent with prescribed medication Bottle Appearance: Standard pharmacy container. Clearly labeled. Filled Date: 4 / 9 / 2019 Last Medication intake:  09/04/2018   Pharmacokinetics: Liberation and absorption (onset of action): WNL Distribution (time to peak effect): WNL Metabolism and excretion (duration of action): WNL         Pharmacodynamics: Desired effects: Analgesia: Brittney [name] reports <50% benefit. Functional ability: Patient reports being unable to accomplish basic ADLs Clinically meaningful improvement in function (CMIF): Medication does not meet basic CMIF Perceived effectiveness: Described as ineffective and would like to make some changes Undesirable effects: Side-effects or Adverse reactions: None reported Monitoring: [place] PMP: Online review of the past 41-monthperiod conducted. Compliant with practice rules and regulations Last UDS on record: Summary  Date Value Ref Range Status  12/07/2016 FINAL  Final    Comment:     ==================================================================== TOXASSURE COMP DRUG ANALYSIS,UR ==================================================================== Test                             Result       Flag       Units Drug Present and Declared for Prescription Verification   Alprazolam                     350          EXPECTED   ng/mg creat   Alpha-hydroxyalprazolam        1573         EXPECTED   ng/mg creat    Source of alprazolam is a scheduled prescription medication.    Alpha-hydroxyalprazolam is an expected metabolite of alprazolam.   Butalbital                     PRESENT      EXPECTED   Tramadol                       PRESENT      EXPECTED   O-Desmethyltramadol            PRESENT      EXPECTED   N-Desmethyltramadol            PRESENT      EXPECTED    Source of tramadol is a prescription medication.    O-desmethyltramadol and N-desmethyltramadol are expected    metabolites of tramadol.   Gabapentin                     PRESENT      EXPECTED   Cyclobenzaprine                PRESENT  EXPECTED   Citalopram                     PRESENT      EXPECTED   Desmethylcitalopram            PRESENT      EXPECTED    Desmethylcitalopram is an expected metabolite of citalopram or    the enantiomeric form, escitalopram.   Trazodone                      PRESENT      EXPECTED   1,3 chlorophenyl piperazine    PRESENT      EXPECTED    1,3-chlorophenyl piperazine is an expected metabolite of    trazodone.   Acetaminophen                  PRESENT      EXPECTED   Ibuprofen                      PRESENT      EXPECTED   Atenolol                       PRESENT      EXPECTED Drug Present not Declared for Prescription Verification   Lorazepam                      602          UNEXPECTED ng/mg creat    Source of lorazepam is a scheduled prescription medication. Drug Absent but Declared for Prescription Verification   Zolpidem                       Not Detected UNEXPECTED    Zolpidem, as  indicated in the declared medication list, is not    always detected even when used as directed.   Diclofenac                     Not Detected UNEXPECTED    Diclofenac, as indicated in the declared medication list, is not    always detected even when used as directed.   Lidocaine                      Not Detected UNEXPECTED    Lidocaine, as indicated in the declared medication list, is not    always detected even when used as directed.   Clonidine                      Not Detected UNEXPECTED ==================================================================== Test                      Result    Flag   Units      Ref Range   Creatinine              101              mg/dL      >=20 ==================================================================== Declared Medications:  The flagging and interpretation on this report are based on the  following declared medications.  Unexpected results may arise from  inaccuracies in the declared medications.  **Note: The testing scope of this panel includes these medications:  Alprazolam (Xanax)  Atenolol  Butalbital (Fioricet)  Citalopram (Lexapro)  Clonidine  Cyclobenzaprine (Flexeril)  Gabapentin  Tramadol (Ultram)  Trazodone (  Desyrel)  **Note: The testing scope of this panel does not include small to  moderate amounts of these reported medications:  Acetaminophen (Fioricet)  Diclofenac (Voltaren)  Ibuprofen  Lidocaine (Lidoderm)  Zolpidem (Ambien)  **Note: The testing scope of this panel does not include following  reported medications:  Albuterol  Doxazosin (Cardura)  Eye Drops  Fluticasone (Flonase)  Hydralazine (Apresoline)  Hydrochlorothiazide  Insulin (Humulin)  Insulin (Levemir)  Levofloxacin (Levaquin)  Linaclotide (Linzess)  Lisinopril  Metformin  Metoclopramide (Reglan)  Ondansetron (Zofran)  Pantoprazole (Protonix)  Potassium  Pramipexole (Mirapex)  Torsemide  (Demadex) ==================================================================== For clinical consultation, please call [phone]. ====================================================================    UDS interpretation: Compliant          Medication Assessment Form: Reviewed. Patient indicates being compliant with therapy Treatment compliance: Compliant Risk Assessment Profile: Aberrant behavior: See prior evaluations. None observed or detected today Comorbid factors increasing risk of overdose: See prior notes. No additional risks detected today Opioid risk tool (ORT) (Total Score): 1 Personal History of Substance Abuse (SUD-Substance use disorder):  Alcohol: Negative  Illegal Drugs: Negative  Rx Drugs: Negative  ORT Risk Level calculation: Low Risk Risk of substance use disorder (SUD): Low Opioid Risk Tool - 09/07/18 1352      Family History of Substance Abuse   Alcohol  Negative    Illegal Drugs  Negative    Rx Drugs  Negative      Personal History of Substance Abuse   Alcohol  Negative    Illegal Drugs  Negative    Rx Drugs  Negative      Age   Age between 61-45 years   No      Psychological Disease   Psychological Disease  Negative    Depression  Positive      Total Score   Opioid Risk Tool Scoring  1    Opioid Risk Interpretation  Low Risk      ORT Scoring interpretation table:  Score <3 = Low Risk for SUD  Score between 4-7 = Moderate Risk for SUD  Score >8 = High Risk for Opioid Abuse   Risk Mitigation Strategies:  Patient Counseling: Covered Patient-Prescriber Agreement (PPA): Present and active  Notification to other healthcare providers: Done  Pharmacologic Plan: We will add [place]rans 7.5 mcg starting today.             Laboratory Chemistry  Inflammation Markers (CRP: Acute Phase) (ESR: Chronic Phase) Lab Results  Component Value Date   CRP 1.2 (H) 12/07/2016   ESRSEDRATE 56 (H) 12/07/2016                         Rheumatology Markers No  results found for: RF, ANA, LABURIC, URICUR, LYMEIGGIGMAB, LYMEABIGMQN, HLAB27                      Renal Function Markers Lab Results  Component Value Date   BUN 14 10/28/2017   CREATININE 0.60 10/28/2017   GFRAA >60 10/28/2017   GFRNONAA >60 10/28/2017                             Hepatic Function Markers Lab Results  Component Value Date   AST 29 10/28/2017   ALT 24 10/28/2017   ALBUMIN 3.4 (L) 10/28/2017   ALKPHOS 103 10/28/2017   LIPASE 114 10/22/2014  Electrolytes Lab Results  Component Value Date   NA 137 10/28/2017   K 3.8 10/28/2017   CL 101 10/28/2017   CALCIUM 9.5 10/28/2017   MG 1.8 12/07/2016                        Neuropathy Markers Lab Results  Component Value Date   VITAMINB12 787 12/07/2016   HGBA1C 9.3 (H) 10/23/2014                        CNS Tests Lab Results  Component Value Date   SDES URINE, CLEAN CATCH 11/21/2016   GRAMSTAIN  07/03/2009    RARE WBC PRESENT, PREDOMINANTLY PMN NO SQUAMOUS EPITHELIAL CELLS SEEN NO ORGANISMS SEEN   GRAMSTAIN  07/03/2009    RARE WBC PRESENT, PREDOMINANTLY PMN NO SQUAMOUS EPITHELIAL CELLS SEEN NO ORGANISMS SEEN   CULT 30,000 COLONIES/mL ENTEROCOCCUS FAECALIS (A) 11/21/2016                        Bone Pathology Markers Lab Results  Component Value Date   25OHVITD1 26 (L) 12/07/2016   25OHVITD2 1.2 12/07/2016   25OHVITD3 25 12/07/2016                         Coagulation Parameters Lab Results  Component Value Date   INR 0.95 05/23/2018   LABPROT 12.6 05/23/2018   APTT 34.9 10/22/2014   PLT 172 05/24/2018                        Cardiovascular Markers Lab Results  Component Value Date   CKTOTAL 161 10/23/2014   CKMB 1.0 10/23/2014   TROPONINI 0.06 (H) 10/23/2014   HGB 12.3 05/24/2018   HCT 36.5 05/24/2018                         CA Markers No results found for: CEA, CA125, LABCA2                      Note: Lab results reviewed.  Recent Diagnostic Imaging Results   DG Lumbar Spine Complete W/Bend CLINICAL DATA:  Chronic back pain.  EXAM: LUMBAR SPINE - COMPLETE WITH BENDING VIEWS  COMPARISON:  Lumbar spine MRI 03/15/2017  FINDINGS: Stable appearing lumbar fusion hardware with pedicle screws and posterior rods. There is also a lateral plate and screws at N3-6. No obvious complicating features associated with the hardware. A spinal cord stimulator is noted in the lower thoracic region.  No acute bony findings or destructive bony changes. No instability or subluxation with flexion/extension. Limited range of motion is noted.  Advanced atherosclerotic calcifications involving the aorta and iliac arteries without definite aneurysm.  IMPRESSION: Lower lumbar fusion hardware appears stable. No obvious complicating features.  No acute bony findings or destructive bony changes.  Spinal cord stimulator appears to be in good position.  Electronically Signed   By: Marijo Sanes M.D.   On: 08/09/2018 08:18  Complexity Note: Imaging results reviewed. Results shared with Ms. Herrig, using Layman's terms.                         Meds   Current Outpatient Medications:  .  albuterol (VENTOLIN HFA) 108 (90 Base) MCG/ACT inhaler, TAKE 2 PUFFS BY MOUTH 3 TIMES A DAY AS NEEDED  FOR SHORTNESS OF BREATH, Disp: , Rfl:  .  ALPRAZolam (XANAX) 0.5 MG tablet, Take 0.5 mg by mouth 4 (four) times daily. , Disp: , Rfl:  .  bumetanide (BUMEX) 1 MG tablet, Take by mouth daily. , Disp: , Rfl:  .  butalbital-acetaminophen-caffeine (FIORICET, ESGIC) 50-325-40 MG tablet, TAKE ONE TABLET BY MOUTH ONCE DAILY, Disp: , Rfl:  .  candesartan (ATACAND) 32 MG tablet, Take 1 tablet (32 mg total) by mouth daily., Disp: 30 tablet, Rfl: 6 .  carvedilol (COREG) 25 MG tablet, Take 37.5-50 mg by mouth 2 (two) times daily with a meal. TAKES 2 TABLETS IN THE MORNING AND 1.5 TABLETS IN THE EVENING, Disp: , Rfl:  .  cloNIDine (CATAPRES - DOSED IN MG/24 HR) 0.3 mg/24hr patch, Place 0.3  mg onto the skin once a week. , Disp: , Rfl:  .  cyclobenzaprine (FLEXERIL) 10 MG tablet, Take 20 mg by mouth at bedtime., Disp: , Rfl:  .  doxazosin (CARDURA) 4 MG tablet, Take 4 mg by mouth at bedtime. , Disp: , Rfl:  .  escitalopram (LEXAPRO) 10 MG tablet, Take 10 mg by mouth daily. , Disp: , Rfl:  .  fluticasone (FLONASE) 50 MCG/ACT nasal spray, PLACE 2 SPRAYS INTO BOTH NOSTRILS 2 (TWO) TIMES DAILY AS NEEDED FOR ALLERGIES, Disp: , Rfl:  .  fluticasone (FLOVENT HFA) 220 MCG/ACT inhaler, TAKE 2 PUFFS BY MOUTH TWICE A DAY, Disp: 12 Inhaler, Rfl: 3 .  gabapentin (NEURONTIN) 600 MG tablet, Take 1 tablet (600 mg total) by mouth 6 (six) times daily. (Patient taking differently: Take 600 mg by mouth See admin instructions. ), Disp: 540 tablet, Rfl: 0 .  hydrALAZINE (APRESOLINE) 25 MG tablet, Take 25 mg by mouth at bedtime. , Disp: , Rfl:  .  ibuprofen (ADVIL,MOTRIN) 800 MG tablet, Take 800 mg by mouth 2 (two) times daily as needed., Disp: , Rfl:  .  Insulin Detemir (LEVEMIR FLEXTOUCH) 100 UNIT/ML Pen, INJECT 90 UNITS SUBCUTANEOUSLY TWICE A DAY, Disp: , Rfl:  .  Insulin Pen Needle (FIFTY50 PEN NEEDLES) 32G X 6 MM MISC, 5 (five) times daily., Disp: , Rfl:  .  insulin regular human CONCENTRATED (HUMULIN R) 500 UNIT/ML kwikpen, Inject 70-75 Units into the skin 3 (three) times daily with meals. PER SLIDING SCALE, Disp: , Rfl:  .  ketoconazole (NIZORAL) 2 % shampoo, APPLY TO AFFECTED AREAS (LEAVE ON FOR 5 MINUTES) , RINSE WELL, USE 2-3 TIMES PER WEEK., Disp: , Rfl: 11 .  levothyroxine (SYNTHROID, LEVOTHROID) 150 MCG tablet, Take 150 mcg by mouth daily before breakfast. , Disp: , Rfl: 11 .  lidocaine (LIDODERM) 5 %, PLACE 1 PATCH ONTO THE MOST PAINFUL AREA OF SKIN DAILY FOR UP TO 12 HOURS IN A 24 HOUR PERIOD, Disp: , Rfl: 5 .  metoCLOPramide (REGLAN) 10 MG tablet, Take 10 mg by mouth 3 (three) times daily., Disp: , Rfl:  .  oxyCODONE-acetaminophen (PERCOCET) 10-325 MG tablet, Take 1 tablet by mouth daily.,  Disp: 30 tablet, Rfl: 0 .  pantoprazole (PROTONIX) 40 MG tablet, TAKE 1 TABLET (40 MG TOTAL) BY MOUTH 2 (TWO) TIMES DAILY. TAKE ONE HOUR BEFORE A MEAL, Disp: , Rfl:  .  potassium chloride (K-DUR,KLOR-CON) 10 MEQ tablet, TAKE 1 TABLET BY MOUTH ONCE A DAY, Disp: , Rfl:  .  pramipexole (MIRAPEX) 0.5 MG tablet, TAKE 2 TABLETS BY MOUTH AT BEDTIME, Disp: , Rfl:  .  traMADol (ULTRAM) 50 MG tablet, Take 2 tablets (100 mg total) by mouth every 8 (  eight) hours., Disp: 180 tablet, Rfl: 2 .  traZODone (DESYREL) 150 MG tablet, Take 150 mg by mouth at bedtime. , Disp: , Rfl:  .  zolpidem (AMBIEN) 10 MG tablet, Take 10 mg by mouth at bedtime. , Disp: , Rfl:  .  Buprenorphine 7.5 MCG/HR PTWK, Place 7.5 mcg/hr onto the skin every 7 (seven) days., Disp: 4 patch, Rfl: 1 .  diclofenac sodium (VOLTAREN) 1 % GEL, Apply 4 g topically as needed (PAIN). , Disp: , Rfl:  .  lactulose (CHRONULAC) 10 GM/15ML solution, Take 10 g by mouth daily as needed for mild constipation., Disp: , Rfl:  .  sucralfate (CARAFATE) 1 g tablet, Take 1 tablet by mouth daily., Disp: , Rfl:  .  traMADol (ULTRAM) 50 MG tablet, Take 100 mg by mouth 3 (three) times daily., Disp: , Rfl: 3  ROS  Constitutional: Denies any fever or chills Gastrointestinal: No reported hemesis, hematochezia, vomiting, or acute GI distress Musculoskeletal: Denies any acute onset joint swelling, redness, loss of ROM, or weakness Neurological: No reported episodes of acute onset apraxia, aphasia, dysarthria, agnosia, amnesia, paralysis, loss of coordination, or loss of consciousness  Allergies  Ms. Deeney is allergic to eggs or egg-derived products; shellfish allergy; amlodipine; codeine; imdur [isosorbide dinitrate]; lyrica [pregabalin]; monosodium glutamate; and tape.  [place]  Drug: Ms. Kreher  reports that she does not use drugs. Alcohol:  reports that she does not drink alcohol. Tobacco:  reports that she has never smoked. She has never used smokeless  tobacco. Medical:  has a past medical history of Abdominal pain, left upper quadrant (12/11/2012), Arthritis, Asthma, Broken leg (2014), Cancer ([place]) (2007), Chronic kidney disease, stage 2 (mild), Chronic lower back pain, Collagen vascular disease ([place]), Coronary artery disease, Diabetes mellitus without complication ([place]), Diabetic nephropathy associated with secondary diabetes mellitus ([place]), Flushing (12/11/2012), Gastroparesis, Gross hematuria (12/11/2012), Hepatic cirrhosis ([place]), Hyperlipidemia, Hypertension, Hypothyroidism, Hypothyroidism, IBS (irritable bowel syndrome), Kidney stone (12/11/2012), Lower extremity edema, Morbid obesity with BMI of 40.0-44.9, adult ([place]), Myocardial infarction ([place]) (09/2013), Nausea without vomiting (12/11/2012), Nephrolithiasis, Nephrolithiasis (04/26/2014), Nonproliferative retinopathy due to secondary diabetes ([place]), Numbness and tingling of right leg, Peripheral vascular disease ([place]), Renal colic (94/70/9628), Sciatica, Sciatica (12/11/2012), Skin cancer, Sleep apnea, and Unilateral small kidney without contralateral hypertrophy. Surgical: Ms. Blahnik  has a past surgical history that includes Cesarean section (1980); Dilation and curettage of uterus; Eye surgery (1986); Abdominal hysterectomy; Tonsillectomy; Esophagogastroduodenoscopy (02/08/2014); Colonoscopy (05/04/2001); Esophagogastroduodenoscopy (egd) with propofol (N/A, 09/20/2016); Colonoscopy with propofol (N/A, 09/20/2016); Mohs surgery (11/2016); Cholecystectomy; Hernia repair; Joint replacement (Left, 09/24/2013); Back surgery (04/1999); Pulse generator implant (N/A, 11/02/2017); and Esophagogastroduodenoscopy (egd) with propofol (N/A, 05/23/2018). Family: family history includes Asthma in her mother; Cancer in her mother; Diabetes in her father; Heart disease in her mother; Hypertension in her father; Kidney disease in her father.  Constitutional Exam  General appearance: Well nourished, well developed, and  well hydrated. In no apparent acute distress Vitals:   09/07/18 1338  BP: (!) 156/68  Pulse: 69  Resp: 18  Temp: 98.7 F (37.1 C)  TempSrc: Oral  SpO2: 95%  Weight: 290 lb (131.5 kg)  Height: '5\' 6"'  (1.676 m)   BMI Assessment: Estimated body mass index is 46.81 kg/m as calculated from the following:   Height as of this encounter: '5\' 6"'  (1.676 m).   Weight as of this encounter: 290 lb (131.5 kg).  BMI interpretation table: BMI level Category Range association with higher incidence of chronic pain  <18 kg/m2 Underweight  18.5-24.9 kg/m2 Ideal body weight   25-29.9 kg/m2 Overweight Increased incidence by 20%  30-34.9 kg/m2 Obese (Class I) Increased incidence by 68%  35-39.9 kg/m2 Severe obesity (Class II) Increased incidence by 136%  >40 kg/m2 Extreme obesity (Class III) Increased incidence by 254%   Patient's current BMI Ideal Body weight  Body mass index is 46.81 kg/m. Ideal body weight: 59.3 kg (130 lb 11.7 oz) Adjusted ideal body weight: 88.2 kg (194 lb 7 oz)   BMI Readings from Last 4 Encounters:  09/07/18 46.81 kg/m  08/31/18 48.36 kg/m  08/08/18 47.63 kg/m  06/09/18 46.32 kg/m   Wt Readings from Last 4 Encounters:  09/07/18 290 lb (131.5 kg)  08/31/18 299 lb 9.6 oz (135.9 kg)  08/08/18 295 lb 1.6 oz (133.9 kg)  06/09/18 287 lb (130.2 kg)  Psych/Mental status: Alert, oriented x 3 (person, place, & time)       Eyes: PERLA Respiratory: No evidence of acute respiratory distress  Cervical Spine Area Exam  Skin & Axial Inspection: No masses, redness, edema, swelling, or associated skin lesions Alignment: Symmetrical Functional ROM: Unrestricted ROM      Stability: No instability detected Muscle Tone/Strength: Functionally intact. No obvious neuro-muscular anomalies detected. Sensory (Neurological): Unimpaired Palpation: No palpable anomalies              Upper Extremity (UE) Exam    Side: Right upper extremity  Side: Left upper extremity  Skin & Extremity  Inspection: Skin color, temperature, and hair growth are WNL. No peripheral edema or cyanosis. No masses, redness, swelling, asymmetry, or associated skin lesions. No contractures.  Skin & Extremity Inspection: Skin color, temperature, and hair growth are WNL. No peripheral edema or cyanosis. No masses, redness, swelling, asymmetry, or associated skin lesions. No contractures.  Functional ROM: Unrestricted ROM          Functional ROM: Unrestricted ROM          Muscle Tone/Strength: Functionally intact. No obvious neuro-muscular anomalies detected.  Muscle Tone/Strength: Functionally intact. No obvious neuro-muscular anomalies detected.  Sensory (Neurological): Unimpaired          Sensory (Neurological): Unimpaired          Palpation: No palpable anomalies              Palpation: No palpable anomalies              Provocative Test(s):  Phalen's test: deferred Tinel's test: deferred Apley's scratch test (touch opposite shoulder):  Action 1 (Across chest): deferred Action 2 (Overhead): deferred Action 3 (LB reach): deferred   Provocative Test(s):  Phalen's test: deferred Tinel's test: deferred Apley's scratch test (touch opposite shoulder):  Action 1 (Across chest): deferred Action 2 (Overhead): deferred Action 3 (LB reach): deferred    Thoracic Spine Area Exam  Skin & Axial Inspection: No masses, redness, or swelling Alignment: Symmetrical Functional ROM: Unrestricted ROM Stability: No instability detected Muscle Tone/Strength: Functionally intact. No obvious neuro-muscular anomalies detected. Sensory (Neurological): Unimpaired Muscle strength & Tone: No palpable anomalies Lumbar Spine Area Exam  Skin & Axial Inspection:Well healed scar from previous spine surgery detected Alignment:Symmetrical Functional GOT:[id number] ROM Stability:No instability detected Muscle Tone/Strength:Functionally intact. No obvious neuro-muscular anomalies detected. Sensory  (Neurological):Dermatomal pain pattern Palpation:Complains of area being tender to palpationBilateral Fist Percussion Test Provocative Tests: Lumbar Hyperextension and rotation test:Positivebilaterally for facet joint pain.R>L Lumbar Lateral bending test:Positivedue to fusion restriction. Patrick's Maneuver:evaluation deferred today  Gait & Posture Assessment  Ambulation:Patient ambulates using a walker Gait:Limited. Using assistive device  to ambulate Posture:Difficulty standing up straight, due to pain  Lower Extremity Exam    Side:Right lower extremity  Side:Left lower extremity  Skin & Extremity Inspection:Skin color, temperature, and hair growth are WNL. No peripheral edema or cyanosis. No masses, redness, swelling, asymmetry, or associated skin lesions. No contractures.  Skin & Extremity Inspection:Skin color, temperature, and hair growth are WNL. No peripheral edema or cyanosis. No masses, redness, swelling, asymmetry, or associated skin lesions. No contractures.  Functional EVO:[id number] ROM  Functional EXH:[id number] ROM  Muscle Tone/Strength:Functionally intact. No obvious neuro-muscular anomalies detected.  Muscle Tone/Strength:Functionally intact. No obvious neuro-muscular anomalies detected.  Sensory (Neurological):Paresthesia (Tingling sensation)  Sensory (Neurological):Paresthesia (Burning sensation)  Palpation:No palpable anomalies  Palpation:No palpable anomalies  4 out of 5 strength right plantarflexion, dorsiflexion, hip flexion, knee extension.   Assessment  Primary Diagnosis & Pertinent Problem List: The primary encounter diagnosis was Failed back surgical syndrome. Diagnoses of Spinal cord stimulator status (Nevro Paddle), Status post lumbar spine surgery for decompression of spinal cord, L3-4 severe lumbar facet hypertrophy and spinal stenosis, Fibromyalgia, L3-4 severe lumbar spinal stenosis (12/31/2016  MRI), Spinal stenosis of thoracic region, Myofascial pain, Chronic lower extremity pain (Location of Secondary source of pain) (Bilateral) (R>L), Neurogenic pain, and Chronic pain syndrome were also pertinent to this visit.  Status Diagnosis  Persistent Persistent Persistent 1. Failed back surgical syndrome   2. Spinal cord stimulator status (Nevro Paddle)   3. 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 L3-4 severe lumbar facet hypertrophy and spinal stenosis   5. Fibromyalgia   6. L3-4 severe lumbar spinal stenosis (12/31/2016 MRI)   7. Spinal stenosis of thoracic region   8. Myofascial pain   9. Chronic lower extremity pain (Location of Secondary source of pain) (Bilateral) (R>L)   10. Neurogenic pain   11. Chronic pain syndrome     General Recommendations: The pain condition that the patient suffers from is best treated with a multidisciplinary approach that involves an increase in physical activity to prevent de-conditioning and worsening of the pain cycle, as well as psychological counseling (formal and/or informal) to address the co-morbid psychological affects of pain. Treatment will often involve judicious use of pain medications and interventional procedures to decrease the pain, allowing the patient to participate in the physical activity that will ultimately produce long-lasting pain reductions. The goal of the multidisciplinary approach is to return the patient to a higher level of overall function and to restore their ability to perform activities of daily living.  68 year old female with multiple medical issues with a history of failed back surgical syndrome status post NEVRO spinal cord stimulator implant who presents with worsening leg pain left greater than right.  At the last visit, x-rays of her lumbar spine were obtained which revealed that her spinal cord stimulator paddle is in its original position.  Patient also has a history of fibromyalgia,  liver cirrhosis, type 2 diabetes along with severe anxiety.  Given that the patient is continuing to have persistent left radicular pain that is been refractory to interventional therapies given that the patient has not obtained significant benefit after her spinal cord stimulator implant, we will focus on medication management.  We discussed buprenorphine therapy to help out with her chronic pain.  Risks and benefits of this medication were discussed.  Plan of Care  Pharmacotherapy (Medications Ordered): Meds ordered this encounter  Medications  . Buprenorphine 7.5 MCG/HR PTWK    Sig: Place 7.5 mcg/hr onto the skin  every 7 (seven) days.    Dispense:  4 patch    Refill:  1    Do not place this medication, or any other prescription from our practice, on "Automatic Refill". Patient may have prescription filled one day early if pharmacy is closed on scheduled refill date.     Provider-requested follow-up: Return in about 6 weeks (around 10/19/2018) for Medication Management.  Future Appointments  Date Time Provider [place]  09/26/2018 12:45 PM [facility] None  09/26/2018  1:00 PM Lloyd Huger, MD [facility] None  09/26/2018  1:30 PM [facility] INFUSION CHAIR 3 [facility] None  10/13/2018  8:00 AM [facility]  10/19/2018  1:45 PM [name], MD [facility] None  09/06/2019  2:15 PM Schnier, Dolores Lory, MD [facility] None    Primary Care Physician: Rusty Aus, MD Location: [facility] Note by: [name], M.D Date: 09/07/2018; Time: 4:06 PM  Patient Instructions  1. Stop Diclofenac 2. Start Butrans patch ( see printout for application locations) 3. Ok to take Oxycodone on painful days.  Buprenorphine has been escribed to your pharmacy.  Buprenorphine transdermal patch What is this medicine? BUPRENORPHINE (byoo pre NOR feen) is a pain reliever. It is used to treat moderate to severe pain. This medicine may  be used for other purposes; ask your health care provider or pharmacist if you have questions. COMMON BRAND NAME(S): Butrans What should I tell my health care provider before I take this medicine? They need to know if you have any of these conditions: -blockage in your bowel -brain tumor -drink more than 3 alcohol-containing drinks per day -drug abuse or addiction -fever -head injury -kidney disease -liver disease -lung or breathing disease, like asthma -thyroid disease -trouble passing urine or change in the amount of urine -an unusual or allergic reaction to buprenorphine, other medicines, foods, dyes, or preservatives -pregnant or trying to get pregnant -breast-feeding How should I use this medicine? Apply the patch to your skin. Do not cut or damage the patch. A cut or damaged patch can be very dangerous because you may get too much medicine. Select a clean, dry area of skin on your upper outer arm, upper chest, upper back, or the side of the chest. Do not apply the patch to broken, burned, cut, or irritated skin. Use only water to clean the area. Do not use soap or alcohol to clean the skin because this can increase the effects of the medicine. If the area is hairy, clip the hair with scissors, but do not shave. Take the patch out of its wrapper. Bend the patch along the faint line and slowly peel the outer portion of the liner, which covers the sticky surface of the patch. Press the patch onto the skin and slowly peel off the protective liner. Do not use a patch if the packaging or backing is damaged. Do not touch the sticky part with your fingers. Press the patch to the skin using the palm of your hand. Press the patch to the skin for 15 seconds. Wash your hands at once. Keep patches far away from children. Do not let children see you apply the patch and do not apply it where children can see it. Do not call the patch a sticker, tattoo, or bandage, as this could encourage the child to  mimic your actions. Used patches still contain medicine. Children or pets can have serious side effects or die from putting used patches in their mouth  or on their bodies. Take off the old patch before putting on a new patch. Apply each new patch to a different area of skin. If a patch comes off or causes irritation, remove it and apply a new patch to a different site. If the edges of the patch start to loosen, first apply first aid tape to the edges of the patch. If problems with the patch not sticking continue, cover the patch with a see-through adhesive dressing (like Bioclusive or Tegaderm). Never cover the patch with any other bandage or tape. To get rid of used patches, fold the patch in half with the sticky sides together. Then, flush it down the toilet. Alternately, you may dispose of the patch in the Patch-Disposal Unit provided. Never throw the patch away in the trash without sealing it in the Patch-Disposal unit. Replace the patch every 7 days. Follow the directions on the prescription label. Do not use more medicine than you are told to use. A special MedGuide will be given to you by the pharmacist with each prescription and refill. Be sure to read this information carefully each time. Talk to your pediatrician regarding the use of this medicine in children. Special care may be needed. If a patch accidentally touches the skin, use only water to clean the area. Do not use soap or alcohol to clean the skin because this can increase the effects of the medicine. If someone accidentally uses a buprenorphine patch and is not awake and alert, immediately call 911 for help. If the person is awake and alert, call a doctor, health care professional, or the [employer]. Overdosage: If you think you have taken too much of this medicine contact a poison control center or emergency room at once. NOTE: This medicine is only for you. Do not share this medicine with others. What if I miss a dose? If you  forget to replace your patch, take off the old patch and put on a new patch as soon as you can. Do not apply an extra patch to your skin. Do not wear more than one patch at the same time unless told to do so by your doctor or health care professional. What may interact with this medicine? Do not take this medication with any of the following medicines: -cisapride -certain medicines for fungal infections like ketoconazole and itraconazole -dofetilide -dronedarone -pimozide -ritonavir -thioridazine -ziprasidone This medicine may interact with the following medications: -alcohol -antihistamines for allergy, cough and cold -antiviral medicines for HIV or AIDS -atropine -certain antibiotics like clarithromycin, erythromycin, linezold, rifampin -certain medicines for anxiety or sleep -certain medicines for bladder problems like oxybutynin, tolterodine -certain medicines for depression like amitriptyline, fluoxetine, sertraline -certain medicines for migraine headache like almotriptan, eletriptan, frovatriptan, naratriptan, rizatriptan, sumatriptan, zolmitriptan -certain medicines for nausea or vomiting like dolasetron, ondansetron, palonosetron -certain medicines for Parkinson's disease like benztropine, trihexyphenidyl -certain medicines for seizures like phenobarbital, primidone -certain medicines for stomach problems like cimetidine, dicyclomine, hyoscyamine -certain medicines for travel sickness like scopolamine -diuretics -general anesthetics like halothane, isoflurane, methoxyflurane, propofol -ipratropium -local anesthetics like lidocaine, pramoxine, tetracaine -MAOIs like Carbex, Eldepryl, Marplan, Nardil, and Parnate -medicines that relax muscles for surgery -methylene blue -other medicines that prolong the QT interval (cause an abnormal heart rhythm) -other narcotic medicines for pain or cough -phenothiazines like chlorpromazine, mesoridazine, prochlorperazine,  thioridazine This list may not describe all possible interactions. Give your health care provider a list of all the medicines, herbs, non-prescription drugs, or dietary supplements you use. Also tell  them if you smoke, drink alcohol, or use illegal drugs. Some items may interact with your medicine. What should I watch for while using this medicine? Other pain medicine may be needed when you first start using the patch because the patch can take some time to start working. Tell your doctor or health care professional if your pain does not go away, if it gets worse, or if you have new or a different type of pain. You may develop tolerance to the medicine. Tolerance means that you will need a higher dose of the medicine for pain relief. Tolerance is normal and is expected if you take the medicine for a long time. Do not suddenly stop taking your medicine because you may develop a severe reaction. Your body becomes used to the medicine. This does NOT mean you are addicted. Addiction is a behavior related to getting and using a drug for a non-medical reason. If you have pain, you have a medical reason to take pain medicine. Your doctor will tell you how much medicine to take. If your doctor wants you to stop the medicine, the dose will be slowly lowered over time to avoid any side effects. If you are also taking a narcotic medicine for pain or cough or another medicine that also causes drowsiness, you may have more side effects. Give your health care provider a list of all medicines you use. Your doctor will tell you how much medicine to take. Do not take more medicine than directed. Call emergency for help if you have problems breathing or unusual sleepiness. You may get drowsy or dizzy. Do not drive, use machinery, or do anything that needs mental alertness until you know how this medicine affects you. Do not stand or sit up quickly, especially if you are an older patient. This reduces the risk of dizzy or  fainting spells. Alcohol may interfere with the effect of this medicine. Avoid alcoholic drinks. The medicine will cause constipation. Try to have a bowel movement at least every 2 to 3 days. If you do not have a bowel movement for 3 days, call your doctor or health care professional. Your mouth may get dry. Chewing sugarless gum or sucking hard candy, and drinking plenty of water may help. Contact your doctor if the problem does not go away or is severe. This medicine patch is sensitive to certain body heat changes. If your skin gets too hot, more medicine will come out of the patch and can cause a deadly overdose. Call your healthcare provider if you get a fever. Do not take hot baths. Do not sunbathe. Do not use hot tubs, saunas, hair dryers, heating pads, electric blankets, heated waterbeds, or tanning lamps. Do not do exercise that increases your body temperature. What side effects may I notice from receiving this medicine? Side effects that you should report to your doctor or health care professional as soon as possible: -allergic reactions like skin rash, itching or hives, swelling of the face, lips, or tongue -breathing problems -confusion -signs and symptoms of a dangerous change in heartbeat or heart rhythm like chest pain; dizziness; fast or irregular heartbeat; palpitations; feeling faint or lightheaded, falls; breathing problems -signs and symptoms of liver injury like dark yellow or brown urine; general ill feeling or flu-like symptoms; light-colored stools; loss of appetite; nausea; right upper belly pain; unusually weak or tired; yellow of the eyes or skin -signs and symptoms of low blood pressure like dizziness; feeling faint or lightheaded, falls; unusually weak or  tired -trouble passing urine or change in the amount of urine Side effects that usually do not require medical attention (report to your doctor or health care professional if they continue or are  bothersome): -constipation -dry mouth -itching, redness, or rash at the patch site -nausea, vomiting -tiredness This list may not describe all possible side effects. Call your doctor for medical advice about side effects. You may report side effects to FDA at 1-800-FDA-1088. Where should I keep my medicine? Keep out of the reach of children. This medicine can be abused. Keep your medicine in a safe place to protect it from theft. Do not share this medicine with anyone. Selling or giving away this medicine is dangerous and against the law. Store at room temperature between 59 and 86 degrees F (15 and 30 degrees C). Do not store the patches out of their wrappers. This medicine may cause accidental overdose and death if it is taken by other adults, children, or pets. Flush any unused medicine down the toilet as instructed above to reduce the chance of harm. Alternately, you may dispose of the patch in the Patch-Disposal Unit provided. Do not use the medicine after the expiration date. NOTE: This sheet is a summary. It may not cover all possible information. If you have questions about this medicine, talk to your doctor, pharmacist, or health care provider.  2018 [employer]vier/Gold Standard (2016-07-28 14:17:49)

## 2018-09-07 NOTE — Progress Notes (Signed)
Nursing Pain Medication Assessment:  Safety precautions to be maintained throughout the outpatient stay will include: orient to surroundings, keep bed in low position, maintain call bell within reach at all times, provide assistance with transfer out of bed and ambulation.  Medication Inspection Compliance: Pill count conducted under aseptic conditions, in front of the patient. Neither the pills nor the bottle was removed from the patient's sight at any time. Once count was completed pills were immediately returned to the patient in their original bottle.  Medication: Oxycodone/APAP Pill/Patch Count: 21 of 30 pills remain Pill/Patch Appearance: Markings consistent with prescribed medication Bottle Appearance: Standard pharmacy container. Clearly labeled. Filled Date: 4 / 9 / 2019 Last Medication intake:  09/04/2018

## 2018-09-07 NOTE — Patient Instructions (Addendum)
1. Stop Diclofenac 2. Start Butrans patch ( see printout for application locations) 3. Ok to take Oxycodone on painful days.  Buprenorphine has been escribed to your pharmacy.  Buprenorphine transdermal patch What is this medicine? BUPRENORPHINE (byoo pre NOR feen) is a pain reliever. It is used to treat moderate to severe pain. This medicine may be used for other purposes; ask your health care provider or pharmacist if you have questions. COMMON BRAND NAME(S): Butrans What should I tell my health care provider before I take this medicine? They need to know if you have any of these conditions: -blockage in your bowel -brain tumor -drink more than 3 alcohol-containing drinks per day -drug abuse or addiction -fever -head injury -kidney disease -liver disease -lung or breathing disease, like asthma -thyroid disease -trouble passing urine or change in the amount of urine -an unusual or allergic reaction to buprenorphine, other medicines, foods, dyes, or preservatives -pregnant or trying to get pregnant -breast-feeding How should I use this medicine? Apply the patch to your skin. Do not cut or damage the patch. A cut or damaged patch can be very dangerous because you may get too much medicine. Select a clean, dry area of skin on your upper outer arm, upper chest, upper back, or the side of the chest. Do not apply the patch to broken, burned, cut, or irritated skin. Use only water to clean the area. Do not use soap or alcohol to clean the skin because this can increase the effects of the medicine. If the area is hairy, clip the hair with scissors, but do not shave. Take the patch out of its wrapper. Bend the patch along the faint line and slowly peel the outer portion of the liner, which covers the sticky surface of the patch. Press the patch onto the skin and slowly peel off the protective liner. Do not use a patch if the packaging or backing is damaged. Do not touch the sticky part with your  fingers. Press the patch to the skin using the palm of your hand. Press the patch to the skin for 15 seconds. Wash your hands at once. Keep patches far away from children. Do not let children see you apply the patch and do not apply it where children can see it. Do not call the patch a sticker, tattoo, or bandage, as this could encourage the child to mimic your actions. Used patches still contain medicine. Children or pets can have serious side effects or die from putting used patches in their mouth or on their bodies. Take off the old patch before putting on a new patch. Apply each new patch to a different area of skin. If a patch comes off or causes irritation, remove it and apply a new patch to a different site. If the edges of the patch start to loosen, first apply first aid tape to the edges of the patch. If problems with the patch not sticking continue, cover the patch with a see-through adhesive dressing (like Bioclusive or Tegaderm). Never cover the patch with any other bandage or tape. To get rid of used patches, fold the patch in half with the sticky sides together. Then, flush it down the toilet. Alternately, you may dispose of the patch in the Patch-Disposal Unit provided. Never throw the patch away in the trash without sealing it in the Patch-Disposal unit. Replace the patch every 7 days. Follow the directions on the prescription label. Do not use more medicine than you are told to use.  A special MedGuide will be given to you by the pharmacist with each prescription and refill. Be sure to read this information carefully each time. Talk to your pediatrician regarding the use of this medicine in children. Special care may be needed. If a patch accidentally touches the skin, use only water to clean the area. Do not use soap or alcohol to clean the skin because this can increase the effects of the medicine. If someone accidentally uses a buprenorphine patch and is not awake and alert, immediately call  911 for help. If the person is awake and alert, call a doctor, health care professional, or the [employer]. Overdosage: If you think you have taken too much of this medicine contact a poison control center or emergency room at once. NOTE: This medicine is only for you. Do not share this medicine with others. What if I miss a dose? If you forget to replace your patch, take off the old patch and put on a new patch as soon as you can. Do not apply an extra patch to your skin. Do not wear more than one patch at the same time unless told to do so by your doctor or health care professional. What may interact with this medicine? Do not take this medication with any of the following medicines: -cisapride -certain medicines for fungal infections like ketoconazole and itraconazole -dofetilide -dronedarone -pimozide -ritonavir -thioridazine -ziprasidone This medicine may interact with the following medications: -alcohol -antihistamines for allergy, cough and cold -antiviral medicines for HIV or AIDS -atropine -certain antibiotics like clarithromycin, erythromycin, linezold, rifampin -certain medicines for anxiety or sleep -certain medicines for bladder problems like oxybutynin, tolterodine -certain medicines for depression like amitriptyline, fluoxetine, sertraline -certain medicines for migraine headache like almotriptan, eletriptan, frovatriptan, naratriptan, rizatriptan, sumatriptan, zolmitriptan -certain medicines for nausea or vomiting like dolasetron, ondansetron, palonosetron -certain medicines for Parkinson's disease like benztropine, trihexyphenidyl -certain medicines for seizures like phenobarbital, primidone -certain medicines for stomach problems like cimetidine, dicyclomine, hyoscyamine -certain medicines for travel sickness like scopolamine -diuretics -general anesthetics like halothane, isoflurane, methoxyflurane, propofol -ipratropium -local anesthetics like  lidocaine, pramoxine, tetracaine -MAOIs like Carbex, Eldepryl, Marplan, Nardil, and Parnate -medicines that relax muscles for surgery -methylene blue -other medicines that prolong the QT interval (cause an abnormal heart rhythm) -other narcotic medicines for pain or cough -phenothiazines like chlorpromazine, mesoridazine, prochlorperazine, thioridazine This list may not describe all possible interactions. Give your health care provider a list of all the medicines, herbs, non-prescription drugs, or dietary supplements you use. Also tell them if you smoke, drink alcohol, or use illegal drugs. Some items may interact with your medicine. What should I watch for while using this medicine? Other pain medicine may be needed when you first start using the patch because the patch can take some time to start working. Tell your doctor or health care professional if your pain does not go away, if it gets worse, or if you have new or a different type of pain. You may develop tolerance to the medicine. Tolerance means that you will need a higher dose of the medicine for pain relief. Tolerance is normal and is expected if you take the medicine for a long time. Do not suddenly stop taking your medicine because you may develop a severe reaction. Your body becomes used to the medicine. This does NOT mean you are addicted. Addiction is a behavior related to getting and using a drug for a non-medical reason. If you have pain, you have a medical reason  to take pain medicine. Your doctor will tell you how much medicine to take. If your doctor wants you to stop the medicine, the dose will be slowly lowered over time to avoid any side effects. If you are also taking a narcotic medicine for pain or cough or another medicine that also causes drowsiness, you may have more side effects. Give your health care provider a list of all medicines you use. Your doctor will tell you how much medicine to take. Do not take more medicine than  directed. Call emergency for help if you have problems breathing or unusual sleepiness. You may get drowsy or dizzy. Do not drive, use machinery, or do anything that needs mental alertness until you know how this medicine affects you. Do not stand or sit up quickly, especially if you are an older patient. This reduces the risk of dizzy or fainting spells. Alcohol may interfere with the effect of this medicine. Avoid alcoholic drinks. The medicine will cause constipation. Try to have a bowel movement at least every 2 to 3 days. If you do not have a bowel movement for 3 days, call your doctor or health care professional. Your mouth may get dry. Chewing sugarless gum or sucking hard candy, and drinking plenty of water may help. Contact your doctor if the problem does not go away or is severe. This medicine patch is sensitive to certain body heat changes. If your skin gets too hot, more medicine will come out of the patch and can cause a deadly overdose. Call your healthcare provider if you get a fever. Do not take hot baths. Do not sunbathe. Do not use hot tubs, saunas, hair dryers, heating pads, electric blankets, heated waterbeds, or tanning lamps. Do not do exercise that increases your body temperature. What side effects may I notice from receiving this medicine? Side effects that you should report to your doctor or health care professional as soon as possible: -allergic reactions like skin rash, itching or hives, swelling of the face, lips, or tongue -breathing problems -confusion -signs and symptoms of a dangerous change in heartbeat or heart rhythm like chest pain; dizziness; fast or irregular heartbeat; palpitations; feeling faint or lightheaded, falls; breathing problems -signs and symptoms of liver injury like dark yellow or brown urine; general ill feeling or flu-like symptoms; light-colored stools; loss of appetite; nausea; right upper belly pain; unusually weak or tired; yellow of the eyes or  skin -signs and symptoms of low blood pressure like dizziness; feeling faint or lightheaded, falls; unusually weak or tired -trouble passing urine or change in the amount of urine Side effects that usually do not require medical attention (report to your doctor or health care professional if they continue or are bothersome): -constipation -dry mouth -itching, redness, or rash at the patch site -nausea, vomiting -tiredness This list may not describe all possible side effects. Call your doctor for medical advice about side effects. You may report side effects to FDA at 1-800-FDA-1088. Where should I keep my medicine? Keep out of the reach of children. This medicine can be abused. Keep your medicine in a safe place to protect it from theft. Do not share this medicine with anyone. Selling or giving away this medicine is dangerous and against the law. Store at room temperature between 59 and 86 degrees F (15 and 30 degrees C). Do not store the patches out of their wrappers. This medicine may cause accidental overdose and death if it is taken by other adults, children, or pets.  Flush any unused medicine down the toilet as instructed above to reduce the chance of harm. Alternately, you may dispose of the patch in the Patch-Disposal Unit provided. Do not use the medicine after the expiration date. NOTE: This sheet is a summary. It may not cover all possible information. If you have questions about this medicine, talk to your doctor, pharmacist, or health care provider.  2018 Elsevier/Gold Standard (2016-07-28 14:17:49)

## 2018-09-12 ENCOUNTER — Encounter: Payer: Medicare Other | Admitting: Physical Therapy

## 2018-09-14 ENCOUNTER — Telehealth: Payer: Self-pay

## 2018-09-14 NOTE — Telephone Encounter (Signed)
Pt called stating she needs prior auth on RX writting by Dr Holley Raring on 09/12. Please call pt and inform.

## 2018-09-15 ENCOUNTER — Telehealth: Payer: Self-pay

## 2018-09-15 NOTE — Telephone Encounter (Signed)
called patient back to inform her that a PA had been sent on 09-11-18.

## 2018-09-19 ENCOUNTER — Encounter: Payer: Medicare Other | Admitting: Physical Therapy

## 2018-09-21 ENCOUNTER — Telehealth: Payer: Self-pay | Admitting: Student in an Organized Health Care Education/Training Program

## 2018-09-21 NOTE — Telephone Encounter (Signed)
Patient is unable to get the medication patch you prescribed. Insurance sent her a letter stating they will not approve this medication. What is next step?

## 2018-09-25 MED ORDER — BUPRENORPHINE HCL 150 MCG BU FILM: 150.0000 ug | ORAL_FILM | Freq: Two times a day (BID) | BUCCAL | 0 refills | Status: DC

## 2018-09-25 NOTE — Progress Notes (Signed)
[place]  [phone] [phone]  ID: Brittney [name] OB: 1951/11/11  MR#: [id number]  SNK#:[id number]  Patient Care Team: Rusty Aus, MD as PCP - General (Internal Medicine)  CHIEF COMPLAINT: Iron deficiency anemia.  INTERVAL HISTORY: Patient returns to clinic today for repeat laboratory work, further evaluation, and consideration of additional IV Feraheme.  She has mild chronic fatigue, but otherwise feels well and is asymptomatic. She has no neurologic complaints. She denies any recent fevers or illnesses. She has a good appetite and denies weight loss. She denies any chest pain or shortness of breath. She has no nausea, vomiting, constipation, or diarrhea. She denies any melena or hematochezia. She has no urinary complaints.  Patient feels at her baseline offers no specific complaints today.  REVIEW OF SYSTEMS:   Review of Systems  Constitutional: Negative for fever, malaise/fatigue and weight loss.  Respiratory: Negative.  Negative for cough, hemoptysis and shortness of breath.   Cardiovascular: Negative.  Negative for chest pain and leg swelling.  Gastrointestinal: Negative.  Negative for abdominal pain, blood in stool and melena.  Genitourinary: Negative.  Negative for dysuria.  Musculoskeletal: Negative.  Negative for back pain.  Skin: Negative.  Negative for rash.  Neurological: Negative.  Negative for sensory change, focal weakness, weakness and headaches.  Psychiatric/Behavioral: Negative.  The patient is not nervous/anxious.     As per HPI. Otherwise, a complete review of systems is negative.  PAST MEDICAL HISTORY: Past Medical History:  Diagnosis Date  . Abdominal pain, left upper quadrant 12/11/2012  . Arthritis   . Asthma   . Broken leg 2014   fell twice and broke same leg. had a bone stimulator for first break.  . Cancer ([place]) 2007   melanoma, left ear and back of left leg, removed from back  . Chronic kidney disease,  stage 2 (mild)   . Chronic lower back pain   . Collagen vascular disease ([place])   . Coronary artery disease   . Diabetes mellitus without complication ([place])   . Diabetic nephropathy associated with secondary diabetes mellitus ([place])   . Flushing 12/11/2012  . Gastroparesis   . Gross hematuria 12/11/2012  . Hepatic cirrhosis ([place])   . Hyperlipidemia   . Hypertension   . Hypothyroidism   . Hypothyroidism   . IBS (irritable bowel syndrome)   . Kidney stone 12/11/2012  . Lower extremity edema   . Morbid obesity with BMI of 40.0-44.9, adult ([place])   . Myocardial infarction ([place]) 09/2013  . Nausea without vomiting 12/11/2012  . Nephrolithiasis   . Nephrolithiasis 04/26/2014  . Nonproliferative retinopathy due to secondary diabetes ([place])   . Numbness and tingling of right leg   . Peripheral vascular disease ([place])   . Renal colic 93/79/0240  . Sciatica   . Sciatica 12/11/2012  . Skin cancer   . Sleep apnea    sleep study coming in november  . Unilateral small kidney without contralateral hypertrophy     PAST SURGICAL HISTORY: Past Surgical History:  Procedure Laterality Date  . ABDOMINAL HYSTERECTOMY    . BACK SURGERY  04/1999   L-4-5 LAMINECTOMY. metal in lower back and neck  . CESAREAN SECTION  1980  . CHOLECYSTECTOMY    . COLONOSCOPY  05/04/2001  . COLONOSCOPY WITH PROPOFOL N/A 09/20/2016   Procedure: COLONOSCOPY WITH PROPOFOL;  Surgeon: Lollie Sails, MD;  Location: [facility] ENDOSCOPY;  Service: Endoscopy;  Laterality: N/A;  . DILATION AND CURETTAGE OF UTERUS    .  ESOPHAGOGASTRODUODENOSCOPY  02/08/2014  . ESOPHAGOGASTRODUODENOSCOPY (EGD) WITH PROPOFOL N/A 09/20/2016   Procedure: ESOPHAGOGASTRODUODENOSCOPY (EGD) WITH PROPOFOL;  Surgeon: Lollie Sails, MD;  Location[facility] ENDOSCOPY;  Service: Endoscopy;  Laterality: N/A;  . ESOPHAGOGASTRODUODENOSCOPY (EGD) WITH PROPOFOL N/A 05/23/2018   Procedure: ESOPHAGOGASTRODUODENOSCOPY (EGD) WITH PROPOFOL;  Surgeon: Lollie Sails,  MD;  Location: [facility] ENDOSCOPY;  Service: Endoscopy;  Laterality: N/A;  . EYE SURGERY  1986   FOR MELANOMA  . HERNIA REPAIR    . JOINT REPLACEMENT Left 09/24/2013   TOTAL KNEE  . MOHS SURGERY  11/2016   left side of nose  . PULSE GENERATOR IMPLANT N/A 11/02/2017   Procedure: UNILATERAL PULSE GENERATOR IMPLANT;  Surgeon: Meade Maw, MD;  Location: [facility] ORS;  Service: Neurosurgery;  Laterality: N/A;  . TONSILLECTOMY     WITH ADENOIDECTOMY    FAMILY HISTORY: Family History  Problem Relation Age of Onset  . Heart disease Mother   . Cancer Mother   . Asthma Mother   . Diabetes Father   . Kidney disease Father   . Hypertension Father   . Breast cancer Neg Hx     ADVANCED DIRECTIVES (Y/N):  N  HEALTH MAINTENANCE: Social History   Tobacco Use  . Smoking status: Never Smoker  . Smokeless tobacco: Never Used  Substance Use Topics  . Alcohol use: No  . Drug use: No     Colonoscopy:  PAP:  Bone density:  Lipid panel:  Allergies  Allergen Reactions  . Eggs Or Egg-Derived Products Nausea And Vomiting    Only has reaction to flu vaccine due to eggs, no reaction to egg food products  . Shellfish Allergy Nausea And Vomiting, Diarrhea and Nausea Only    Non stop sickness once ingests shellfish. Betadine is okay  . Amlodipine Cough  . Codeine Nausea And Vomiting and Nausea Only    states she can take codeine cough syrup without problems states she can take codeine cough syrup without problems  . Imdur [Isosorbide Dinitrate] Other (See Comments)    Headache  . Lyrica [Pregabalin] Swelling and Other (See Comments)    "FLU-LIKE" SYMPTOMS  . Monosodium Glutamate Other (See Comments)    Headache  . Tape Other (See Comments) and Nausea And Vomiting    can use paper tape Redness    Current Outpatient Medications  Medication Sig Dispense Refill  . albuterol (VENTOLIN HFA) 108 (90 Base) MCG/ACT inhaler TAKE 2 PUFFS BY MOUTH 3 TIMES A DAY AS NEEDED FOR SHORTNESS OF BREATH     . ALPRAZolam (XANAX) 0.5 MG tablet Take 0.5 mg by mouth 4 (four) times daily.     . bumetanide (BUMEX) 1 MG tablet Take by mouth daily.     . butalbital-acetaminophen-caffeine (FIORICET, ESGIC) 50-325-40 MG tablet TAKE ONE TABLET BY MOUTH ONCE DAILY    . candesartan (ATACAND) 32 MG tablet Take 1 tablet (32 mg total) by mouth daily. 30 tablet 6  . carvedilol (COREG) 25 MG tablet Take 37.5-50 mg by mouth 2 (two) times daily with a meal. TAKES 2 TABLETS IN THE MORNING AND 1.5 TABLETS IN THE EVENING    . cloNIDine (CATAPRES - DOSED IN MG/24 HR) 0.3 mg/24hr patch Place 0.3 mg onto the skin once a week.     . cyclobenzaprine (FLEXERIL) 10 MG tablet Take 20 mg by mouth at bedtime.    Marland Kitchen doxazosin (CARDURA) 4 MG tablet Take 4 mg by mouth at bedtime.     Marland Kitchen escitalopram (LEXAPRO) 10 MG tablet Take  10 mg by mouth daily.     . fluticasone (FLONASE) 50 MCG/ACT nasal spray PLACE 2 SPRAYS INTO BOTH NOSTRILS 2 (TWO) TIMES DAILY AS NEEDED FOR ALLERGIES    . fluticasone (FLOVENT HFA) 220 MCG/ACT inhaler TAKE 2 PUFFS BY MOUTH TWICE A DAY 36 Inhaler 1  . hydrALAZINE (APRESOLINE) 25 MG tablet Take 25 mg by mouth at bedtime.     Marland Kitchen ibuprofen (ADVIL,MOTRIN) 800 MG tablet Take 800 mg by mouth 2 (two) times daily as needed.    . Insulin Detemir (LEVEMIR FLEXTOUCH) 100 UNIT/ML Pen INJECT 90 UNITS SUBCUTANEOUSLY TWICE A DAY    . Insulin Pen Needle (FIFTY50 PEN NEEDLES) 32G X 6 MM MISC 5 (five) times daily.    . insulin regular human CONCENTRATED (HUMULIN R) 500 UNIT/ML kwikpen Inject 70-75 Units into the skin 3 (three) times daily with meals. PER SLIDING SCALE    . ketoconazole (NIZORAL) 2 % shampoo APPLY TO AFFECTED AREAS (LEAVE ON FOR 5 MINUTES) , RINSE WELL, USE 2-3 TIMES PER WEEK.  11  . lactulose (CHRONULAC) 10 GM/15ML solution Take 10 g by mouth daily as needed for mild constipation.    Marland Kitchen levothyroxine (SYNTHROID, LEVOTHROID) 150 MCG tablet Take 150 mcg by mouth daily before breakfast.   11  . lubiprostone (AMITIZA)  24 MCG capsule TAKE 1 CAPSULE (24 MCG TOTAL) BY MOUTH 2 (TWO) TIMES DAILY WITH MEALS.    Marland Kitchen metoCLOPramide (REGLAN) 10 MG tablet Take 10 mg by mouth 3 (three) times daily.    . pantoprazole (PROTONIX) 40 MG tablet TAKE 1 TABLET (40 MG TOTAL) BY MOUTH 2 (TWO) TIMES DAILY. TAKE ONE HOUR BEFORE A MEAL    . potassium chloride (K-DUR,KLOR-CON) 10 MEQ tablet TAKE 1 TABLET BY MOUTH ONCE A DAY    . pramipexole (MIRAPEX) 0.5 MG tablet TAKE 2 TABLETS BY MOUTH AT BEDTIME    . traMADol (ULTRAM) 50 MG tablet Take 100 mg by mouth 3 (three) times daily.  3  . traZODone (DESYREL) 150 MG tablet Take 150 mg by mouth at bedtime.     Marland Kitchen zolpidem (AMBIEN) 10 MG tablet Take 10 mg by mouth at bedtime.     . diclofenac sodium (VOLTAREN) 1 % GEL Apply 4 g topically as needed (PAIN).     Marland Kitchen gabapentin (NEURONTIN) 600 MG tablet Take 1 tablet (600 mg total) by mouth 6 (six) times daily. (Patient taking differently: Take 600 mg by mouth See admin instructions. ) 540 tablet 0  . lidocaine (LIDODERM) 5 % PLACE 1 PATCH ONTO THE MOST PAINFUL AREA OF SKIN DAILY FOR UP TO 12 HOURS IN A 24 HOUR PERIOD  5  . oxyCODONE-acetaminophen (PERCOCET) 10-325 MG tablet Take 1 tablet by mouth daily. 30 tablet 0  . sucralfate (CARAFATE) 1 g tablet Take 1 tablet by mouth daily.    . traMADol (ULTRAM) 50 MG tablet Take 2 tablets (100 mg total) by mouth every 8 (eight) hours. 180 tablet 2   No current facility-administered medications for this visit.     OBJECTIVE: Vitals:   09/26/18 1323  BP: (!) 153/81  Pulse: 75  Resp: 18  Temp: 99 F (37.2 C)     Body mass index is 48.92 kg/m.    ECOG FS:0 - Asymptomatic  General: Well-developed, well-nourished, no acute distress. Eyes: Pink conjunctiva, anicteric sclera. HEENT: Normocephalic, moist mucous membranes. Lungs: Clear to auscultation bilaterally. Heart: Regular rate and rhythm. No rubs, murmurs, or gallops. Abdomen: Soft, nontender, nondistended. No organomegaly noted, normoactive bowel  sounds. Musculoskeletal: No edema, cyanosis, or clubbing. Neuro: Alert, answering all questions appropriately. Cranial nerves grossly intact. Skin: No rashes or petechiae noted. Psych: Normal affect.  LAB RESULTS:  Lab Results  Component Value Date   NA 137 10/28/2017   K 3.8 10/28/2017   CL 101 10/28/2017   CO2 28 10/28/2017   GLUCOSE 204 (H) 10/28/2017   BUN 14 10/28/2017   CREATININE 0.60 10/28/2017   CALCIUM 9.5 10/28/2017   PROT 7.0 10/28/2017   ALBUMIN 3.4 (L) 10/28/2017   AST 29 10/28/2017   ALT 24 10/28/2017   ALKPHOS 103 10/28/2017   BILITOT 0.6 10/28/2017   GFRNONAA >60 10/28/2017   GFRAA >60 10/28/2017    Lab Results  Component Value Date   WBC 6.5 09/26/2018   NEUTROABS 4.1 09/26/2018   HGB 11.4 (L) 09/26/2018   HCT 34.8 (L) 09/26/2018   MCV 87.6 09/26/2018   PLT 177 09/26/2018   Lab Results  Component Value Date   IRON 57 09/26/2018   TIBC 275 09/26/2018   IRONPCTSAT 21 09/26/2018   Lab Results  Component Value Date   FERRITIN 66 09/26/2018     STUDIES: No results found.  ASSESSMENT: Iron deficiency anemia.  PLAN:    1. Iron deficiency anemia: Patient's hemoglobin is mildly decreased at 11.4, but her iron stores continue to be within normal limits.  She remains asymptomatic. Previously, the remainder of her laboratory work was also either negative or within normal limits. She does not require additional Feraheme today. Patient last received treatment on July 13, 2017. Colonoscopy and EGD completed on September 20, 2016 were essentially normal other than some mild gastritis.  Return to clinic in 6 months with repeat laboratory can further evaluation.  If patient's laboratory work remained stable at that time, she likely can be discharged from clinic.  2. Hypertension: Patient's blood pressure is mildly elevated today. Continue evaluation and treatment per primary care. 3. Back pain: Chronic and unchanged.  Patient has a permanent spinal stimulator  in place.  Patient expressed understanding and was in agreement with this plan. She also understands that She can call clinic at any time with any questions, concerns, or complaints.    Lloyd Huger, MD   09/29/2018 3:29 PM

## 2018-09-25 NOTE — Telephone Encounter (Signed)
Patient notified

## 2018-09-25 NOTE — Telephone Encounter (Signed)
Received message that patient's Butrans patch was not covered by insurance.  We can try belbuca instead since it is also buprenorphine.  We will start her at 150 mcg twice daily, quantity 60/month.  Prescription provided for 1 month.  Requested Prescriptions   Signed Prescriptions Disp Refills  . Buprenorphine HCl (BELBUCA) 150 MCG FILM 60 each 0    Sig: Place 150 mcg inside cheek 2 (two) times daily.    Authorizing Provider: [name]

## 2018-09-26 ENCOUNTER — Inpatient Hospital Stay (HOSPITAL_BASED_OUTPATIENT_CLINIC_OR_DEPARTMENT_OTHER): Payer: Medicare Other | Admitting: Oncology

## 2018-09-26 ENCOUNTER — Encounter: Payer: Medicare Other | Admitting: Physical Therapy

## 2018-09-26 ENCOUNTER — Inpatient Hospital Stay: Payer: Medicare Other | Attending: Oncology

## 2018-09-26 ENCOUNTER — Other Ambulatory Visit: Payer: Self-pay | Admitting: Pulmonary Disease

## 2018-09-26 ENCOUNTER — Encounter: Payer: Self-pay | Admitting: Oncology

## 2018-09-26 ENCOUNTER — Inpatient Hospital Stay: Payer: Medicare Other

## 2018-09-26 ENCOUNTER — Other Ambulatory Visit: Payer: Self-pay

## 2018-09-26 VITALS — BP 153/81 | HR 75 | Temp 99.0°F | Resp 18 | Wt 303.1 lb

## 2018-09-26 DIAGNOSIS — E119 Type 2 diabetes mellitus without complications: Secondary | ICD-10-CM

## 2018-09-26 DIAGNOSIS — M549 Dorsalgia, unspecified: Secondary | ICD-10-CM

## 2018-09-26 DIAGNOSIS — I10 Essential (primary) hypertension: Secondary | ICD-10-CM | POA: Insufficient documentation

## 2018-09-26 DIAGNOSIS — D509 Iron deficiency anemia, unspecified: Secondary | ICD-10-CM

## 2018-09-26 DIAGNOSIS — G8929 Other chronic pain: Secondary | ICD-10-CM | POA: Insufficient documentation

## 2018-09-26 LAB — CBC WITH DIFFERENTIAL/PLATELET
BASOS ABS: 0.1 10*3/uL (ref 0–0.1)
BASOS PCT: 1 %
EOS ABS: 0.3 10*3/uL (ref 0–0.7)
Eosinophils Relative: 5 %
HEMATOCRIT: 34.8 % — AB (ref 35.0–47.0)
Hemoglobin: 11.4 g/dL — ABNORMAL LOW (ref 12.0–16.0)
Lymphocytes Relative: 25 %
Lymphs Abs: 1.6 10*3/uL (ref 1.0–3.6)
MCH: 28.8 pg (ref 26.0–34.0)
MCHC: 32.8 g/dL (ref 32.0–36.0)
MCV: 87.6 fL (ref 80.0–100.0)
MONO ABS: 0.4 10*3/uL (ref 0.2–0.9)
Monocytes Relative: 7 %
NEUTROS ABS: 4.1 10*3/uL (ref 1.4–6.5)
Neutrophils Relative %: 62 %
Platelets: 177 10*3/uL (ref 150–440)
RBC: 3.97 MIL/uL (ref 3.80–5.20)
RDW: 14.6 % — AB (ref 11.5–14.5)
WBC: 6.5 10*3/uL (ref 3.6–11.0)

## 2018-09-26 LAB — IRON AND TIBC
Iron: 57 ug/dL (ref 28–170)
Saturation Ratios: 21 % (ref 10.4–31.8)
TIBC: 275 ug/dL (ref 250–450)
UIBC: 218 ug/dL

## 2018-09-26 LAB — FERRITIN: FERRITIN: 66 ng/mL (ref 11–307)

## 2018-09-26 NOTE — Progress Notes (Signed)
Patient here for follow up. No concerns voiced. Pt states feeling very tired.

## 2018-09-28 ENCOUNTER — Ambulatory Visit (INDEPENDENT_AMBULATORY_CARE_PROVIDER_SITE_OTHER): Payer: Medicare Other | Admitting: Pulmonary Disease

## 2018-09-28 ENCOUNTER — Encounter: Payer: Self-pay | Admitting: Pulmonary Disease

## 2018-09-28 VITALS — BP 180/100 | HR 82 | Resp 16 | Ht 66.0 in | Wt 302.0 lb

## 2018-09-28 DIAGNOSIS — R05 Cough: Secondary | ICD-10-CM

## 2018-09-28 DIAGNOSIS — R053 Chronic cough: Secondary | ICD-10-CM

## 2018-09-28 DIAGNOSIS — G4733 Obstructive sleep apnea (adult) (pediatric): Secondary | ICD-10-CM | POA: Diagnosis not present

## 2018-09-28 NOTE — Progress Notes (Signed)
PULMONARY/SLEEP OFFICE FOLLOW-UP NOTE  Requesting MD/Service: Emily Filbert, MD Date of initial consultation: 09/30/17 Reason for consultation: Chronic cough  PT PROFILE: 67 y.o. female never smoker previously see by Dr Ileene Musa and Dr Raul Del referred for eval of chronic cough of 4 yrs duration  DATA: 05/25/17 CT chest: Normal lung parenchyma. 3 mm posterior segment right upper lobe nodule, stable from 10/15/2014 and therefore benign. 3 mm left lower lobe nodular density, likely mucoid impaction  12/08/17 PFTs: No obstruction, lung volumes low-normal, DLCO 69% predicted, DLCO/VA 136% predicted 12/21/17 PSG: RDI 5.3. CPAP titration study recommended 03/30-04/28/19 compliance: usage 29/30 nights, > 4 hours 23/30 nights  PROBLEMS: Mild OSA Severe obesity Recurrent acute bronchitis Chronic cough  INTERVAL: Last seen 04/24/18. No major pulmonary events.  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  SUBJ:  This is a scheduled office follow-up.  She has no new pulmonary complaints.  She remains on Flovent but admits that she is only using this intermittently, when she has a cough.  She also uses albuterol intermittently.  She usually uses both inhalers at the same time.  She is wearing CPAP compliantly for sleep apnea.  She has no complaints related to this.  She denies daytime hypersomnolence.  She has been unable to effectively lose any weight, in fact has gained 8 pounds since last visit.  She attributes this to her inability to exercise.  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  She requires a walker.  She insists that she is following a low carbohydrate diet.   Vitals:   09/28/18 1100 09/28/18 1107  BP:  (!) 180/100  Pulse:  82  Resp: 16   SpO2:  93%  Weight: (!) 302 lb (137 kg)   Height: 5\' 6"  (1.676 m)   RA   EXAM:  Gen: Obese, no overt distress HEENT: NCAT, sclerae white Neck: JVP cannot be  visualized Lungs: Breath sounds full without wheezes or other adventitious sounds Cardiovascular: Regular, no M Abdomen: Obese, soft, NABS Ext: 1-2+ pretibial pitting edema, symmetric Neuro: No focal neurologic deficits  DATA:   BMP Latest Ref Rng & Units 10/28/2017 12/07/2016 06/03/2016  Glucose 65 - 99 mg/dL 204(H) 43(LL) -  BUN 6 - 20 mg/dL 14 13 -  Creatinine 0.44 - 1.00 mg/dL 0.60 0.66 0.70  Sodium 135 - 145 mmol/L 137 139 -  Potassium 3.5 - 5.1 mmol/L 3.8 3.7 -  Chloride 101 - 111 mmol/L 101 103 -  CO2 22 - 32 mmol/L 28 28 -  Calcium 8.9 - 10.3 mg/dL 9.5 9.9 -    CBC Latest Ref Rng & Units 09/26/2018 05/24/2018 05/23/2018  WBC 3.6 - 11.0 K/uL 6.5 9.1 8.7  Hemoglobin 12.0 - 16.0 g/dL 11.4(L) 12.3 13.1  Hematocrit 35.0 - 47.0 % 34.8(L) 36.5 39.6  Platelets 150 - 440 K/uL 177 172 168    CXR: No new film  IMPRESSION:   Obstructive sleep apnea  Chronic cough  Severe obesity   We again discussed the impact that her weight is having on her overall well-being including her respiratory status.  I again raised the possibility of bariatric surgery.  However, she is not very interested in undertaking such a measure.  PLAN:  Continue Flovent inhaler.  I recommended that she use it at least daily in a preventive manner rather than using it "as needed".  Continue albuterol MDI as needed for cough, chest tightness, wheezing  Continue CPAP with sleep  I encouraged that she continue efforts at weight loss emphasizing a low carbohydrate dietary approach.  I encouraged that she further consider the possibility of bariatric surgery.  Follow-up in 6 months or sooner as needed   Merton Border, MD PCCM service Mobile [phone] Pager [phone] 09/28/2018 11:28 AM

## 2018-09-28 NOTE — Patient Instructions (Signed)
Continue Flovent inhaler.  I have recommended to use it at least daily in a preventive manner.  Rinse mouth after use  Continue albuterol inhaler as needed  Continue CPAP with sleep  Continue efforts at weight loss.  I recommended a strict low carbohydrate dietary approach.  Follow-up in 6 months.  Call sooner if needed

## 2018-10-13 ENCOUNTER — Ambulatory Visit
Admission: RE | Admit: 2018-10-13 | Discharge: 2018-10-13 | Disposition: A | Discharge status: Home / Self Care | Payer: Medicare Other | Source: Ambulatory Visit | Attending: Gastroenterology | Admitting: Gastroenterology

## 2018-10-13 DIAGNOSIS — K7460 Unspecified cirrhosis of liver: Secondary | ICD-10-CM | POA: Diagnosis not present

## 2018-10-13 DIAGNOSIS — R161 Splenomegaly, not elsewhere classified: Secondary | ICD-10-CM | POA: Diagnosis not present

## 2018-10-19 ENCOUNTER — Encounter: Payer: Self-pay | Admitting: Student in an Organized Health Care Education/Training Program

## 2018-10-19 ENCOUNTER — Ambulatory Visit
Payer: Medicare Other | Attending: Student in an Organized Health Care Education/Training Program | Admitting: Student in an Organized Health Care Education/Training Program

## 2018-10-19 VITALS — BP 180/74 | HR 72 | Temp 99.5°F | Resp 18 | Ht 66.0 in | Wt 296.0 lb

## 2018-10-19 DIAGNOSIS — D509 Iron deficiency anemia, unspecified: Secondary | ICD-10-CM | POA: Diagnosis not present

## 2018-10-19 DIAGNOSIS — Z9889 Other specified postprocedural states: Secondary | ICD-10-CM

## 2018-10-19 DIAGNOSIS — M797 Fibromyalgia: Secondary | ICD-10-CM

## 2018-10-19 DIAGNOSIS — M4804 Spinal stenosis, thoracic region: Secondary | ICD-10-CM | POA: Diagnosis not present

## 2018-10-19 DIAGNOSIS — F329 Major depressive disorder, single episode, unspecified: Secondary | ICD-10-CM | POA: Insufficient documentation

## 2018-10-19 DIAGNOSIS — E1143 Type 2 diabetes mellitus with diabetic autonomic (poly)neuropathy: Secondary | ICD-10-CM | POA: Diagnosis not present

## 2018-10-19 DIAGNOSIS — M7918 Myalgia, other site: Secondary | ICD-10-CM

## 2018-10-19 DIAGNOSIS — G4733 Obstructive sleep apnea (adult) (pediatric): Secondary | ICD-10-CM | POA: Diagnosis not present

## 2018-10-19 DIAGNOSIS — M48061 Spinal stenosis, lumbar region without neurogenic claudication: Secondary | ICD-10-CM | POA: Diagnosis not present

## 2018-10-19 DIAGNOSIS — M1711 Unilateral primary osteoarthritis, right knee: Secondary | ICD-10-CM | POA: Insufficient documentation

## 2018-10-19 DIAGNOSIS — I129 Hypertensive chronic kidney disease with stage 1 through stage 4 chronic kidney disease, or unspecified chronic kidney disease: Secondary | ICD-10-CM | POA: Diagnosis not present

## 2018-10-19 DIAGNOSIS — M79604 Pain in right leg: Secondary | ICD-10-CM

## 2018-10-19 DIAGNOSIS — I252 Old myocardial infarction: Secondary | ICD-10-CM | POA: Diagnosis not present

## 2018-10-19 DIAGNOSIS — E039 Hypothyroidism, unspecified: Secondary | ICD-10-CM | POA: Insufficient documentation

## 2018-10-19 DIAGNOSIS — E559 Vitamin D deficiency, unspecified: Secondary | ICD-10-CM | POA: Insufficient documentation

## 2018-10-19 DIAGNOSIS — Z79899 Other long term (current) drug therapy: Secondary | ICD-10-CM | POA: Insufficient documentation

## 2018-10-19 DIAGNOSIS — M792 Neuralgia and neuritis, unspecified: Secondary | ICD-10-CM

## 2018-10-19 DIAGNOSIS — I2510 Atherosclerotic heart disease of native coronary artery without angina pectoris: Secondary | ICD-10-CM | POA: Diagnosis not present

## 2018-10-19 DIAGNOSIS — K7460 Unspecified cirrhosis of liver: Secondary | ICD-10-CM | POA: Insufficient documentation

## 2018-10-19 DIAGNOSIS — E785 Hyperlipidemia, unspecified: Secondary | ICD-10-CM | POA: Diagnosis not present

## 2018-10-19 DIAGNOSIS — Z6841 Body Mass Index (BMI) 40.0 and over, adult: Secondary | ICD-10-CM | POA: Insufficient documentation

## 2018-10-19 DIAGNOSIS — I872 Venous insufficiency (chronic) (peripheral): Secondary | ICD-10-CM | POA: Diagnosis not present

## 2018-10-19 DIAGNOSIS — E1151 Type 2 diabetes mellitus with diabetic peripheral angiopathy without gangrene: Secondary | ICD-10-CM | POA: Diagnosis not present

## 2018-10-19 DIAGNOSIS — Z79891 Long term (current) use of opiate analgesic: Secondary | ICD-10-CM | POA: Insufficient documentation

## 2018-10-19 DIAGNOSIS — I890 Lymphedema, not elsewhere classified: Secondary | ICD-10-CM | POA: Insufficient documentation

## 2018-10-19 DIAGNOSIS — Z9689 Presence of other specified functional implants: Secondary | ICD-10-CM | POA: Diagnosis not present

## 2018-10-19 DIAGNOSIS — Z5181 Encounter for therapeutic drug level monitoring: Secondary | ICD-10-CM | POA: Insufficient documentation

## 2018-10-19 DIAGNOSIS — G894 Chronic pain syndrome: Secondary | ICD-10-CM | POA: Insufficient documentation

## 2018-10-19 DIAGNOSIS — K3184 Gastroparesis: Secondary | ICD-10-CM | POA: Insufficient documentation

## 2018-10-19 DIAGNOSIS — E1122 Type 2 diabetes mellitus with diabetic chronic kidney disease: Secondary | ICD-10-CM | POA: Diagnosis not present

## 2018-10-19 DIAGNOSIS — J45909 Unspecified asthma, uncomplicated: Secondary | ICD-10-CM | POA: Insufficient documentation

## 2018-10-19 DIAGNOSIS — G8929 Other chronic pain: Secondary | ICD-10-CM

## 2018-10-19 DIAGNOSIS — M79605 Pain in left leg: Secondary | ICD-10-CM

## 2018-10-19 DIAGNOSIS — M961 Postlaminectomy syndrome, not elsewhere classified: Secondary | ICD-10-CM | POA: Diagnosis not present

## 2018-10-19 DIAGNOSIS — E11319 Type 2 diabetes mellitus with unspecified diabetic retinopathy without macular edema: Secondary | ICD-10-CM | POA: Diagnosis not present

## 2018-10-19 DIAGNOSIS — M5416 Radiculopathy, lumbar region: Secondary | ICD-10-CM

## 2018-10-19 DIAGNOSIS — E1121 Type 2 diabetes mellitus with diabetic nephropathy: Secondary | ICD-10-CM | POA: Diagnosis not present

## 2018-10-19 DIAGNOSIS — N182 Chronic kidney disease, stage 2 (mild): Secondary | ICD-10-CM | POA: Insufficient documentation

## 2018-10-19 DIAGNOSIS — K589 Irritable bowel syndrome without diarrhea: Secondary | ICD-10-CM | POA: Insufficient documentation

## 2018-10-19 DIAGNOSIS — M47816 Spondylosis without myelopathy or radiculopathy, lumbar region: Secondary | ICD-10-CM

## 2018-10-19 DIAGNOSIS — Z885 Allergy status to narcotic agent status: Secondary | ICD-10-CM | POA: Insufficient documentation

## 2018-10-19 DIAGNOSIS — Z794 Long term (current) use of insulin: Secondary | ICD-10-CM | POA: Insufficient documentation

## 2018-10-19 DIAGNOSIS — Z888 Allergy status to other drugs, medicaments and biological substances status: Secondary | ICD-10-CM | POA: Insufficient documentation

## 2018-10-19 MED ORDER — OXYCODONE-ACETAMINOPHEN 10-325 MG PO TABS: 1.0000 | ORAL_TABLET | Freq: Two times a day (BID) | ORAL | 0 refills | Status: DC | PRN

## 2018-10-19 MED ORDER — OXYCODONE-ACETAMINOPHEN 10-325 MG PO TABS: 1.0000 | ORAL_TABLET | Freq: Two times a day (BID) | ORAL | 0 refills | Status: AC | PRN

## 2018-10-19 NOTE — Progress Notes (Signed)
Nursing Pain Medication Assessment:  Safety precautions to be maintained throughout the outpatient stay will include: orient to surroundings, keep bed in low position, maintain call bell within reach at all times, provide assistance with transfer out of bed and ambulation.  Medication Inspection Compliance: Pill count conducted under aseptic conditions, in front of the patient. Neither the pills nor the bottle was removed from the patient's sight at any time. Once count was completed pills were immediately returned to the patient in their original bottle.  Medication: Oxycodone/APAP Pill/Patch Count: 12 of 30 pills remain Pill/Patch Appearance: Markings consistent with prescribed medication Bottle Appearance: Standard pharmacy container. Clearly labeled. Filled Date: 4 / 9 / 2019 Last Medication intake:  "2 weeks ago"

## 2018-10-19 NOTE — Patient Instructions (Signed)
Oxycodone with acetaminophen to last until 01/17/2019 has been escribed to your pharmacy.

## 2018-10-19 NOTE — Progress Notes (Signed)
Patient's Name: Brittney [name]  MRN: [id number]  Referring Provider: Rusty Aus, MD  DOB: [birth year]  PCP: Rusty Aus, MD  DOS: 10/19/2018  Note by: [name], MD  Service setting: Ambulatory outpatient  Specialty: Interventional Pain Management  Location: [facility] (AMB) Pain Management Facility    Patient type: Established   Primary Reason(s) for Visit: Encounter for prescription drug management. (Level of risk: moderate)  CC: Back Pain (lower) and Leg Pain (right)  HPI  Brittney [name] is a 67 y.o. year old, female patient, who comes today for a medication management evaluation. She has Abnormal tumor markers; Chronic kidney disease, stage II (mild); Degenerative spondylolisthesis; Diabetes mellitus with peripheral vascular disease ([place]); Diabetes with retinopathy ([place]); Diabetic nephropathy ([place]); DM (diabetes mellitus) type II uncontrolled, periph vascular disorder ([place]); Fibromyalgia; Gastroparesis due to DM ([place]); Generalized osteoarthritis of multiple sites; Hepatic cirrhosis ([place]); Hyperlipemia; Lung nodule, multiple; Microalbuminuria; OSA (obstructive sleep apnea); Seronegative arthritis; Essential hypertension; L3-4 severe lumbar spinal stenosis (12/31/2016 MRI); Type 2 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, with long-term current use of insulin ([place]); Unilateral small kidney; Long term current use of opiate analgesic; Long term prescription opiate use; Opiate use; Chronic pain syndrome; Morbid obesity with BMI of 40.0-44.9, adult ([place]); Type 2 diabetes mellitus with diabetic nephropathy, with long-term current use of insulin ([place]); Chronic low back pain (Location of Primary Source of Pain) (Bilateral) (R>L); 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); Chronic lower extremity pain (Location of Secondary source of pain) (Bilateral) (R>L); Chronic knee pain (Location of Tertiary source of pain) (Right); Osteoarthritis of knee  (Right); Grade 1 Anterolisthesis of L3 over L4 and L4 over L5; Osteoarthritis of sacroiliac joint (Right); L3-4 severe lumbar facet hypertrophy and spinal stenosis; Neurogenic pain; Long term prescription benzodiazepine use; Vitamin D insufficiency; Iron deficiency anemia; Back pain; Status post lumbar spine surgery for decompression of spinal cord; Myofascial pain; Spinal stenosis of thoracic region; Lymphedema; and Chronic venous insufficiency on their problem list. Her primarily concern today is the Back Pain (lower) and Leg Pain (right)  Pain Assessment: Location: Lower Back Radiating: denies Onset: More than a month ago Duration: Chronic pain Quality: Constant Severity: 7 /10 (subjective, self-reported pain score)  Note: Reported level is inconsistent with clinical observations. Clinically the patient looks like a 4/10 A 4/10 is viewed as "Moderately Severe" and described as impossible to ignore for more than a few minutes. With effort, patients may still be able to manage work or participate in some social activities. Very difficult to concentrate. Signs of autonomic nervous system discharge are evident: dilated pupils (mydriasis); mild sweating (diaphoresis); sleep interference. Heart rate becomes elevated (>115 bpm). Diastolic blood pressure (lower number) rises above 100 mmHg. Patients find relief in laying down and not moving. Information on the proper use of the pain scale provided to the patient today. When using our objective Pain Scale, levels between 6 and 10/10 are said to belong in an emergency room, as it progressively worsens from a 6/10, described as severely limiting, requiring emergency care not usually available at an outpatient pain management facility. At a 6/10 level, communication becomes difficult and requires great effort. Assistance to reach the emergency department may be required. Facial flushing and profuse sweating along with potentially dangerous increases in heart rate  and blood pressure will be evident. Effect on ADL: difficult to stand up straight Timing: Constant Modifying factors: laying down, meds BP: (!) 180/74  HR: 72  Brittney [name]  was last scheduled for an appointment on 09/21/2018 for medication management. During today's appointment we reviewed Brittney [name]'s chronic pain status, as well as her outpatient medication regimen.  Patient presents today for follow-up.  She is continuing to endorse severe axial low back pain with radiation down her right greater than left leg.  At her last visit, she was prescribed Butrans but that was denied by her insurance.  She was alternatively prescribed belbuca which was also denied by her pharmacy.  It is frustrating that attempts to prescribe milder opioid analgesics that have better cardiorespiratory side effect profile have been denied by the insurance especially in the context of her morbid obesity and obstructive sleep apnea.  The patient states that she is in significant pain on most days and needs something stronger to help her function.  The patient  reports that she does not use drugs. Her body mass index is 47.78 kg/m.  Further details on both, my assessment(s), as well as the proposed treatment plan, please see below.  Controlled Substance Pharmacotherapy Assessment REMS (Risk Evaluation and Mitigation Strategy)  Analgesic: Percocet 10 mg filled 04-04-2018, has been utilizing same prescription since then, quantity 30 MME/day: Less than 10 mg/day.  Rise Patience, RN  10/19/2018  2:08 PM  Sign at close encounter Nursing Pain Medication Assessment:  Safety precautions to be maintained throughout the outpatient stay will include: orient to surroundings, keep bed in low position, maintain call bell within reach at all times, provide assistance with transfer out of bed and ambulation.  Medication Inspection Compliance: Pill count conducted under aseptic conditions, in front of the patient. Neither the pills nor  the bottle was removed from the patient's sight at any time. Once count was completed pills were immediately returned to the patient in their original bottle.  Medication: Oxycodone/APAP Pill/Patch Count: 12 of 30 pills remain Pill/Patch Appearance: Markings consistent with prescribed medication Bottle Appearance: Standard pharmacy container. Clearly labeled. Filled Date: 4 / 46 / 2019 Last Medication intake:  "2 weeks ago" Pharmacokinetics: Liberation and absorption (onset of action): WNL Distribution (time to peak effect): WNL Metabolism and excretion (duration of action): WNL         Pharmacodynamics: Desired effects: Analgesia: Brittney [name] reports >50% benefit. Functional ability: Patient reports that medication allows her to accomplish basic ADLs Clinically meaningful improvement in function (CMIF): Sustained CMIF goals met Perceived effectiveness: Described as relatively effective, allowing for increase in activities of daily living (ADL) Undesirable effects: Side-effects or Adverse reactions: None reported Monitoring: [place] PMP: Online review of the past 34-monthperiod conducted. Compliant with practice rules and regulations Last UDS on record: Summary  Date Value Ref Range Status  12/07/2016 FINAL  Final    Comment:    ==================================================================== TOXASSURE COMP DRUG ANALYSIS,UR ==================================================================== Test                             Result       Flag       Units Drug Present and Declared for Prescription Verification   Alprazolam                     350          EXPECTED   ng/mg creat   Alpha-hydroxyalprazolam        1573         EXPECTED   ng/mg creat    Source of alprazolam is a scheduled prescription medication.  Alpha-hydroxyalprazolam is an expected metabolite of alprazolam.   Butalbital                     PRESENT      EXPECTED   Tramadol                       PRESENT       EXPECTED   O-Desmethyltramadol            PRESENT      EXPECTED   N-Desmethyltramadol            PRESENT      EXPECTED    Source of tramadol is a prescription medication.    O-desmethyltramadol and N-desmethyltramadol are expected    metabolites of tramadol.   Gabapentin                     PRESENT      EXPECTED   Cyclobenzaprine                PRESENT      EXPECTED   Citalopram                     PRESENT      EXPECTED   Desmethylcitalopram            PRESENT      EXPECTED    Desmethylcitalopram is an expected metabolite of citalopram or    the enantiomeric form, escitalopram.   Trazodone                      PRESENT      EXPECTED   1,3 chlorophenyl piperazine    PRESENT      EXPECTED    1,3-chlorophenyl piperazine is an expected metabolite of    trazodone.   Acetaminophen                  PRESENT      EXPECTED   Ibuprofen                      PRESENT      EXPECTED   Atenolol                       PRESENT      EXPECTED Drug Present not Declared for Prescription Verification   Lorazepam                      602          UNEXPECTED ng/mg creat    Source of lorazepam is a scheduled prescription medication. Drug Absent but Declared for Prescription Verification   Zolpidem                       Not Detected UNEXPECTED    Zolpidem, as indicated in the declared medication list, is not    always detected even when used as directed.   Diclofenac                     Not Detected UNEXPECTED    Diclofenac, as indicated in the declared medication list, is not    always detected even when used as directed.   Lidocaine                      Not Detected UNEXPECTED  Lidocaine, as indicated in the declared medication list, is not    always detected even when used as directed.   Clonidine                      Not Detected UNEXPECTED ==================================================================== Test                      Result    Flag   Units      Ref Range   Creatinine              101               mg/dL      >=20 ==================================================================== Declared Medications:  The flagging and interpretation on this report are based on the  following declared medications.  Unexpected results may arise from  inaccuracies in the declared medications.  **Note: The testing scope of this panel includes these medications:  Alprazolam (Xanax)  Atenolol  Butalbital (Fioricet)  Citalopram (Lexapro)  Clonidine  Cyclobenzaprine (Flexeril)  Gabapentin  Tramadol (Ultram)  Trazodone (Desyrel)  **Note: The testing scope of this panel does not include small to  moderate amounts of these reported medications:  Acetaminophen (Fioricet)  Diclofenac (Voltaren)  Ibuprofen  Lidocaine (Lidoderm)  Zolpidem (Ambien)  **Note: The testing scope of this panel does not include following  reported medications:  Albuterol  Doxazosin (Cardura)  Eye Drops  Fluticasone (Flonase)  Hydralazine (Apresoline)  Hydrochlorothiazide  Insulin (Humulin)  Insulin (Levemir)  Levofloxacin (Levaquin)  Linaclotide (Linzess)  Lisinopril  Metformin  Metoclopramide (Reglan)  Ondansetron (Zofran)  Pantoprazole (Protonix)  Potassium  Pramipexole (Mirapex)  Torsemide (Demadex) ==================================================================== For clinical consultation, please call [phone]. ====================================================================    UDS interpretation: Compliant          Medication Assessment Form: Reviewed. Patient indicates being compliant with therapy Treatment compliance: Compliant Risk Assessment Profile: Aberrant behavior: See prior evaluations. None observed or detected today Comorbid factors increasing risk of overdose: Benzodiazepine use, caucasian and sleep apnea Opioid risk tool (ORT) (Total Score): 1 Personal History of Substance Abuse (SUD-Substance use disorder):  Alcohol: Negative  Illegal Drugs: Negative  Rx Drugs:  Negative  ORT Risk Level calculation: Low Risk Risk of substance use disorder (SUD): Low Opioid Risk Tool - 10/19/18 1410      Family History of Substance Abuse   Alcohol  Negative    Illegal Drugs  Negative    Rx Drugs  Negative      Personal History of Substance Abuse   Alcohol  Negative    Illegal Drugs  Negative    Rx Drugs  Negative      Age   Age between 40-45 years   No      History of Preadolescent Sexual Abuse   History of Preadolescent Sexual Abuse  Negative or Female      Psychological Disease   Psychological Disease  Negative    Depression  Positive      Total Score   Opioid Risk Tool Scoring  1    Opioid Risk Interpretation  Low Risk      ORT Scoring interpretation table:  Score <3 = Low Risk for SUD  Score between 4-7 = Moderate Risk for SUD  Score >8 = High Risk for Opioid Abuse   Risk Mitigation Strategies:  Patient Counseling: Covered Patient-Prescriber Agreement (PPA): Present and active  Notification to other healthcare providers: Done  Pharmacologic Plan: Increase Percocet to 10 mg twice daily  as needed from daily as needed.             Laboratory Chemistry  Inflammation Markers (CRP: Acute Phase) (ESR: Chronic Phase) Lab Results  Component Value Date   CRP 1.2 (H) 12/07/2016   ESRSEDRATE 56 (H) 12/07/2016                         Rheumatology Markers No results found for: RF, ANA, LABURIC, URICUR, LYMEIGGIGMAB, LYMEABIGMQN, HLAB27                      Renal Function Markers Lab Results  Component Value Date   BUN 14 10/28/2017   CREATININE 0.60 10/28/2017   GFRAA >60 10/28/2017   GFRNONAA >60 10/28/2017                             Hepatic Function Markers Lab Results  Component Value Date   AST 29 10/28/2017   ALT 24 10/28/2017   ALBUMIN 3.4 (L) 10/28/2017   ALKPHOS 103 10/28/2017   LIPASE 114 10/22/2014                        Electrolytes Lab Results  Component Value Date   NA 137 10/28/2017   K 3.8 10/28/2017   CL 101  10/28/2017   CALCIUM 9.5 10/28/2017   MG 1.8 12/07/2016                        Neuropathy Markers Lab Results  Component Value Date   XFGHWEXH37 169 12/07/2016   HGBA1C 9.3 (H) 10/23/2014                        CNS Tests No results found for: COLORCSF, APPEARCSF, RBCCOUNTCSF, WBCCSF, POLYSCSF, LYMPHSCSF, EOSCSF, PROTEINCSF, GLUCCSF, JCVIRUS, CSFOLI, IGGCSF                      Bone Pathology Markers Lab Results  Component Value Date   25OHVITD1 26 (L) 12/07/2016   25OHVITD2 1.2 12/07/2016   25OHVITD3 25 12/07/2016                         Coagulation Parameters Lab Results  Component Value Date   INR 0.95 05/23/2018   LABPROT 12.6 05/23/2018   APTT 34.9 10/22/2014   PLT 177 09/26/2018                        Cardiovascular Markers Lab Results  Component Value Date   CKTOTAL 161 10/23/2014   CKMB 1.0 10/23/2014   TROPONINI 0.06 (H) 10/23/2014   HGB 11.4 (L) 09/26/2018   HCT 34.8 (L) 09/26/2018                         CA Markers No results found for: CEA, CA125, LABCA2                      Note: Lab results reviewed.  Recent Diagnostic Imaging Results  US Abdomen Complete CLINICAL DATA:  Cirrhosis.  EXAM: ABDOMEN ULTRASOUND COMPLETE  COMPARISON:  Abdominal ultrasound 03/27/2018  FINDINGS: Gallbladder: Post cholecystectomy.  Common bile duct: Diameter: 8 mm  Liver: Coarse heterogeneous parenchymal echogenicity. No focal masses seen. Portal vein is  patent on color Doppler imaging with normal direction of blood flow towards the liver.  IVC: No abnormality visualized.  Pancreas: Obscured by bowel gas.  Spleen: Splenic length is 12.2 cm for a volume of 438 cc.  Right Kidney: Length: 11.1 cm. Echogenicity within normal limits. No mass or hydronephrosis visualized.  Left Kidney: Length: 14.0 cm. Echogenicity within normal limits. No mass or hydronephrosis visualized.  Abdominal aorta: No aneurysm visualized.  Other findings:  None.  IMPRESSION: Technically difficult exam which demonstrates findings of liver cirrhosis without focal masses.  Borderline splenomegaly.  Electronically Signed   By: Fidela Salisbury M.D.   On: 10/13/2018 10:41  Complexity Note: Imaging results reviewed. Results shared with Brittney [name], using Layman's terms.                         Meds   Current Outpatient Medications:  .  albuterol (VENTOLIN HFA) 108 (90 Base) MCG/ACT inhaler, TAKE 2 PUFFS BY MOUTH 3 TIMES A DAY AS NEEDED FOR SHORTNESS OF BREATH, Disp: , Rfl:  .  ALPRAZolam (XANAX) 0.5 MG tablet, Take 0.5 mg by mouth 4 (four) times daily. , Disp: , Rfl:  .  bumetanide (BUMEX) 1 MG tablet, Take by mouth daily. , Disp: , Rfl:  .  butalbital-acetaminophen-caffeine (FIORICET, ESGIC) 50-325-40 MG tablet, TAKE ONE TABLET BY MOUTH ONCE DAILY, Disp: , Rfl:  .  candesartan (ATACAND) 32 MG tablet, Take 1 tablet (32 mg total) by mouth daily., Disp: 30 tablet, Rfl: 6 .  carvedilol (COREG) 25 MG tablet, Take 37.5-50 mg by mouth 2 (two) times daily with a meal. TAKES 2 TABLETS IN THE MORNING AND 1.5 TABLETS IN THE EVENING, Disp: , Rfl:  .  cloNIDine (CATAPRES - DOSED IN MG/24 HR) 0.3 mg/24hr patch, Place 0.3 mg onto the skin once a week. , Disp: , Rfl:  .  cyclobenzaprine (FLEXERIL) 10 MG tablet, Take 20 mg by mouth at bedtime., Disp: , Rfl:  .  diclofenac sodium (VOLTAREN) 1 % GEL, Apply 4 g topically as needed (PAIN). , Disp: , Rfl:  .  doxazosin (CARDURA) 4 MG tablet, Take 4 mg by mouth at bedtime. , Disp: , Rfl:  .  escitalopram (LEXAPRO) 10 MG tablet, Take 10 mg by mouth daily. , Disp: , Rfl:  .  fluticasone (FLONASE) 50 MCG/ACT nasal spray, PLACE 2 SPRAYS INTO BOTH NOSTRILS 2 (TWO) TIMES DAILY AS NEEDED FOR ALLERGIES, Disp: , Rfl:  .  fluticasone (FLOVENT HFA) 220 MCG/ACT inhaler, TAKE 2 PUFFS BY MOUTH TWICE A DAY, Disp: 36 Inhaler, Rfl: 1 .  gabapentin (NEURONTIN) 600 MG tablet, Take 1 tablet (600 mg total) by mouth 6 (six) times  daily. (Patient taking differently: Take 600 mg by mouth See admin instructions. ), Disp: 540 tablet, Rfl: 0 .  hydrALAZINE (APRESOLINE) 25 MG tablet, Take 25 mg by mouth at bedtime. , Disp: , Rfl:  .  ibuprofen (ADVIL,MOTRIN) 800 MG tablet, Take 800 mg by mouth 3 (three) times daily as needed. , Disp: , Rfl:  .  Insulin Detemir (LEVEMIR FLEXTOUCH) 100 UNIT/ML Pen, INJECT 90 UNITS SUBCUTANEOUSLY TWICE A DAY, Disp: , Rfl:  .  Insulin Pen Needle (FIFTY50 PEN NEEDLES) 32G X 6 MM MISC, 5 (five) times daily., Disp: , Rfl:  .  insulin regular human CONCENTRATED (HUMULIN R) 500 UNIT/ML kwikpen, Inject 70-75 Units into the skin 3 (three) times daily with meals. PER SLIDING SCALE, Disp: , Rfl:  .  ketoconazole (NIZORAL) 2 %  shampoo, APPLY TO AFFECTED AREAS (LEAVE ON FOR 5 MINUTES) , RINSE WELL, USE 2-3 TIMES PER WEEK., Disp: , Rfl: 11 .  lactulose (CHRONULAC) 10 GM/15ML solution, Take 10 g by mouth daily as needed for mild constipation., Disp: , Rfl:  .  levothyroxine (SYNTHROID, LEVOTHROID) 150 MCG tablet, Take 150 mcg by mouth daily before breakfast. , Disp: , Rfl: 11 .  lubiprostone (AMITIZA) 24 MCG capsule, TAKE 1 CAPSULE (24 MCG TOTAL) BY MOUTH 2 (TWO) TIMES DAILY WITH MEALS., Disp: , Rfl:  .  metoCLOPramide (REGLAN) 10 MG tablet, Take 10 mg by mouth 3 (three) times daily., Disp: , Rfl:  .  oxyCODONE-acetaminophen (PERCOCET) 10-325 MG tablet, Take 1 tablet by mouth 2 (two) times daily as needed for pain., Disp: 60 tablet, Rfl: 0 .  pantoprazole (PROTONIX) 40 MG tablet, TAKE 1 TABLET (40 MG TOTAL) BY MOUTH 2 (TWO) TIMES DAILY. TAKE ONE HOUR BEFORE A MEAL, Disp: , Rfl:  .  potassium chloride (K-DUR,KLOR-CON) 10 MEQ tablet, TAKE 1 TABLET BY MOUTH ONCE A DAY, Disp: , Rfl:  .  pramipexole (MIRAPEX) 0.5 MG tablet, TAKE 2 TABLETS BY MOUTH AT BEDTIME, Disp: , Rfl:  .  sucralfate (CARAFATE) 1 g tablet, Take 1 tablet by mouth daily., Disp: , Rfl:  .  traMADol (ULTRAM) 50 MG tablet, Take 100 mg by mouth 3 (three)  times daily., Disp: , Rfl: 3 .  traZODone (DESYREL) 150 MG tablet, Take 150 mg by mouth at bedtime. , Disp: , Rfl:  .  zolpidem (AMBIEN) 10 MG tablet, Take 10 mg by mouth at bedtime. , Disp: , Rfl:  .  lidocaine (LIDODERM) 5 %, PLACE 1 PATCH ONTO THE MOST PAINFUL AREA OF SKIN DAILY FOR UP TO 12 HOURS IN A 24 HOUR PERIOD, Disp: , Rfl: 5 .  [START ON 11/18/2018] oxyCODONE-acetaminophen (PERCOCET) 10-325 MG tablet, Take 1 tablet by mouth 2 (two) times daily as needed for pain., Disp: 60 tablet, Rfl: 0 .  [START ON 12/18/2018] oxyCODONE-acetaminophen (PERCOCET) 10-325 MG tablet, Take 1 tablet by mouth 2 (two) times daily as needed for pain., Disp: 60 tablet, Rfl: 0 .  traMADol (ULTRAM) 50 MG tablet, Take 2 tablets (100 mg total) by mouth every 8 (eight) hours., Disp: 180 tablet, Rfl: 2  ROS  Constitutional: Denies any fever or chills Gastrointestinal: No reported hemesis, hematochezia, vomiting, or acute GI distress Musculoskeletal: Denies any acute onset joint swelling, redness, loss of ROM, or weakness Neurological: No reported episodes of acute onset apraxia, aphasia, dysarthria, agnosia, amnesia, paralysis, loss of coordination, or loss of consciousness  Allergies  Brittney [name] is allergic to eggs or egg-derived products; shellfish allergy; amlodipine; codeine; imdur [isosorbide dinitrate]; lyrica [pregabalin]; monosodium glutamate; and tape.  [place]  Drug: Brittney [name]  reports that she does not use drugs. Alcohol:  reports that she does not drink alcohol. Tobacco:  reports that she has never smoked. She has never used smokeless tobacco. Medical:  has a past medical history of Abdominal pain, left upper quadrant (12/11/2012), Arthritis, Asthma, Broken leg (2014), Cancer ([place]) (2007), Chronic kidney disease, stage 2 (mild), Chronic lower back pain, Collagen vascular disease ([place]), Coronary artery disease, Diabetes mellitus without complication ([place]), Diabetic nephropathy associated with secondary  diabetes mellitus ([place]), [place] (12/11/2012), Gastroparesis, Gross hematuria (12/11/2012), Hepatic cirrhosis ([place]), Hyperlipidemia, Hypertension, Hypothyroidism, Hypothyroidism, IBS (irritable bowel syndrome), Kidney stone (12/11/2012), Lower extremity edema, Morbid obesity with BMI of 40.0-44.9, adult ([place]), Myocardial infarction ([place]) (09/2013), Nausea without vomiting (12/11/2012), Nephrolithiasis, Nephrolithiasis (04/26/2014), Nonproliferative retinopathy  due to secondary diabetes ([place]), Numbness and tingling of right leg, Peripheral vascular disease ([place]), Renal colic (32/44/0102), Sciatica, Sciatica (12/11/2012), Skin cancer (08/2018), Sleep apnea, and Unilateral small kidney without contralateral hypertrophy. Surgical: Brittney [name]  has a past surgical history that includes Cesarean section (1980); Dilation and curettage of uterus; Eye surgery (1986); Abdominal hysterectomy; Tonsillectomy; Esophagogastroduodenoscopy (02/08/2014); Colonoscopy (05/04/2001); Esophagogastroduodenoscopy (egd) with propofol (N/A, 09/20/2016); Colonoscopy with propofol (N/A, 09/20/2016); Mohs surgery (11/2016); Cholecystectomy; Hernia repair; Joint replacement (Left, 09/24/2013); Back surgery (04/1999); Pulse generator implant (N/A, 11/02/2017); and Esophagogastroduodenoscopy (egd) with propofol (N/A, 05/23/2018). Family: family history includes Asthma in her mother; Cancer in her mother; Diabetes in her father; Heart disease in her mother; Hypertension in her father; Kidney disease in her father.  Constitutional Exam  General appearance: Well nourished, well developed, and well hydrated. In no apparent acute distress Vitals:   10/19/18 1355  BP: (!) 180/74  Pulse: 72  Resp: 18  Temp: 99.5 F (37.5 C)  TempSrc: Oral  SpO2: 95%  Weight: 296 lb (134.3 kg)  Height: '5\' 6"'  (1.676 m)   BMI Assessment: Estimated body mass index is 47.78 kg/m as calculated from the following:   Height as of this encounter: '5\' 6"'  (1.676 m).    Weight as of this encounter: 296 lb (134.3 kg).  BMI interpretation table: BMI level Category Range association with higher incidence of chronic pain  <18 kg/m2 Underweight   18.5-24.9 kg/m2 Ideal body weight   25-29.9 kg/m2 Overweight Increased incidence by 20%  30-34.9 kg/m2 Obese (Class I) Increased incidence by 68%  35-39.9 kg/m2 Severe obesity (Class II) Increased incidence by 136%  >40 kg/m2 Extreme obesity (Class III) Increased incidence by 254%   Patient's current BMI Ideal Body weight  Body mass index is 47.78 kg/m. Ideal body weight: 59.3 kg (130 lb 11.7 oz) Adjusted ideal body weight: 89.3 kg (196 lb 13.4 oz)   BMI Readings from Last 4 Encounters:  10/19/18 47.78 kg/m  09/28/18 48.74 kg/m  09/26/18 48.92 kg/m  09/07/18 46.81 kg/m   Wt Readings from Last 4 Encounters:  10/19/18 296 lb (134.3 kg)  09/28/18 (!) 302 lb (137 kg)  09/26/18 (!) 303 lb 1.6 oz (137.5 kg)  09/07/18 290 lb (131.5 kg)  Psych/Mental status: Alert, oriented x 3 (person, place, & time)       Eyes: PERLA Respiratory: No evidence of acute respiratory distress  Cervical Spine Area Exam  Skin & Axial Inspection: No masses, redness, edema, swelling, or associated skin lesions Alignment: Symmetrical Functional ROM: Unrestricted ROM      Stability: No instability detected Muscle Tone/Strength: Functionally intact. No obvious neuro-muscular anomalies detected. Sensory (Neurological): Unimpaired Palpation: No palpable anomalies              Upper Extremity (UE) Exam    Side: Right upper extremity  Side: Left upper extremity  Skin & Extremity Inspection: Skin color, temperature, and hair growth are WNL. No peripheral edema or cyanosis. No masses, redness, swelling, asymmetry, or associated skin lesions. No contractures.  Skin & Extremity Inspection: Skin color, temperature, and hair growth are WNL. No peripheral edema or cyanosis. No masses, redness, swelling, asymmetry, or associated skin lesions. No  contractures.  Functional ROM: Unrestricted ROM          Functional ROM: Unrestricted ROM          Muscle Tone/Strength: Functionally intact. No obvious neuro-muscular anomalies detected.  Muscle Tone/Strength: Functionally intact. No obvious neuro-muscular anomalies detected.  Sensory (Neurological): Unimpaired  Sensory (Neurological): Unimpaired          Palpation: No palpable anomalies              Palpation: No palpable anomalies              Provocative Test(s):  Phalen's test: deferred Tinel's test: deferred Apley's scratch test (touch opposite shoulder):  Action 1 (Across chest): deferred Action 2 (Overhead): deferred Action 3 (LB reach): deferred   Provocative Test(s):  Phalen's test: deferred Tinel's test: deferred Apley's scratch test (touch opposite shoulder):  Action 1 (Across chest): deferred Action 2 (Overhead): deferred Action 3 (LB reach): deferred    Thoracic Spine Area Exam  Skin & Axial Inspection: No masses, redness, or swelling Alignment: Symmetrical Functional ROM: Unrestricted ROM Stability: No instability detected Muscle Tone/Strength: Functionally intact. No obvious neuro-muscular anomalies detected. Sensory (Neurological): Unimpaired Muscle strength & Tone: No palpable anomalies Lumbar Spine Area Exam  Skin & Axial Inspection:Well healed scar from previous spine surgery detected Alignment:Symmetrical Functional OEH:[id number] ROM Stability:No instability detected Muscle Tone/Strength:Functionally intact. No obvious neuro-muscular anomalies detected. Sensory (Neurological):Dermatomal pain pattern Palpation:Complains of area being tender to palpationBilateral Fist Percussion Test Provocative Tests: Lumbar Hyperextension and rotation test:Positivebilaterally for facet joint pain.R>L Lumbar Lateral bending test:Positivedue to fusion restriction. Patrick's Maneuver:evaluation deferred today  Gait & Posture  Assessment  Ambulation:Patient ambulates using a walker Gait:Limited. Using assistive device to ambulate Posture:Difficulty standing up straight, due to pain  Lower Extremity Exam    Side:Right lower extremity  Side:Left lower extremity  Skin & Extremity Inspection:Skin color, temperature, and hair growth are WNL. No peripheral edema or cyanosis. No masses, redness, swelling, asymmetry, or associated skin lesions. No contractures.  Skin & Extremity Inspection:Skin color, temperature, and hair growth are WNL. No peripheral edema or cyanosis. No masses, redness, swelling, asymmetry, or associated skin lesions. No contractures.  Functional IBB:[id number] ROM  Functional IHW:[id number] ROM  Muscle Tone/Strength:Functionally intact. No obvious neuro-muscular anomalies detected.  Muscle Tone/Strength:Functionally intact. No obvious neuro-muscular anomalies detected.  Sensory (Neurological):Paresthesia (Tingling sensation)  Sensory (Neurological):Paresthesia (Burning sensation)  Palpation:No palpable anomalies  Palpation:No palpable anomalies  4 out of 5 strength right plantarflexion, dorsiflexion, hip flexion, knee extension.   Assessment  Primary Diagnosis & Pertinent Problem List: The primary encounter diagnosis was Failed back surgical syndrome. Diagnoses of Spinal cord stimulator status (Nevro Paddle), Status post lumbar spine surgery for decompression of spinal cord, L3-4 severe lumbar facet hypertrophy and spinal stenosis, Fibromyalgia, L3-4 severe lumbar spinal stenosis (12/31/2016 MRI), Spinal stenosis of thoracic region, Myofascial pain, Neurogenic pain, and Chronic lower extremity pain (Location of Secondary source of pain) (Bilateral) (R>L) were also pertinent to this visit.  Status Diagnosis  Persistent Persistent Persistent 1. Failed back surgical syndrome   2. Spinal cord stimulator status (Nevro Paddle)   3. Status post lumbar spine surgery for  decompression of spinal cord   4. L3-4 severe lumbar facet hypertrophy and spinal stenosis   5. Fibromyalgia   6. L3-4 severe lumbar spinal stenosis (12/31/2016 MRI)   7. Spinal stenosis of thoracic region   8. Myofascial pain   9. Neurogenic pain   10. Chronic lower extremity pain (Location of Secondary source of pain) (Bilateral) (R>L)      Patient presents today for follow-up.  She is continuing to endorse severe axial low back pain with radiation down her left greater than right leg.  At her last visit, she was prescribed Butrans but that was denied by her insurance.  She was alternatively prescribed belbuca which  was also denied by her pharmacy.  It is frustrating that attempts to prescribe milder opioid analgesics that have better cardiorespiratory side effect profile have been denied by the insurance especially in the context of her morbid obesity and obstructive sleep apnea.  The patient states that she is in significant pain on most days and needs something stronger to help her function.  I had an extensive conversation about her current medications which include Xanax as well as Ambien.  Patient also has a history of morbid obesity as well as obstructive sleep apnea, compliant on CPAP therapy.  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 I explained the risks of concomitant benzodiazepine and opioid therapy when it comes to cardiovascular and respiratory risk associated with concomitant therapy.  I recommended the patient try and discontinue her Xanax and if she must take it to not take it within 60 to 90 minutes of taking Percocet.  I also recommended the patient discontinue her Ambien  as it is limiting her ability in reaching stage IV and REM sleep which could be contributing to not feeling well rested when she wakes up.  Patient is a previous pharmacy tech and endorsed understanding about timing of medications.  She states that she will try to take as little Xanax as possible for anxiety.  I discussed opioid tolerance as well as the risks of chronic opioid therapy with this patient.  At this point, I feel that we are so limited in options other than titration of opioid analgesics.  Patient is currently on Flexeril, gabapentin, lidocaine patches along with Voltaren gel.  Patient endorsed understanding.  All questions and concerns were answered.  Prescription provided for 3 months below.   Plan of Care  Pharmacotherapy (Medications Ordered): Meds ordered this encounter  Medications  . oxyCODONE-acetaminophen (PERCOCET) 10-325 MG tablet    Sig: Take 1 tablet by mouth 2 (two) times daily as needed for pain.    Dispense:  60 tablet    Refill:  0    Do not place this medication, or any other prescription from our practice, on "Automatic Refill". Patient may have prescription filled one day early if pharmacy is closed on scheduled refill date.  Marland Kitchen oxyCODONE-acetaminophen (PERCOCET) 10-325 MG tablet    Sig: Take 1 tablet by mouth 2 (two) times daily as needed for pain.    Dispense:  60 tablet    Refill:  0    Do not place this medication, or any other prescription from our practice, on "Automatic Refill". Patient may have prescription filled one day early if pharmacy is closed on scheduled refill date.  Marland Kitchen oxyCODONE-acetaminophen (PERCOCET) 10-325 MG tablet    Sig: Take 1 tablet by mouth 2 (two) times daily as needed for pain.    Dispense:  60 tablet    Refill:  0    Do not place this medication, or any other prescription from our practice, on "Automatic Refill". Patient may have prescription filled one day early if pharmacy is closed on scheduled refill date.   Time Note: Greater  than 50% of the 25 minute(s) of face-to-face time spent with Ms. Olthoff, was spent in counseling/coordination of care regarding: Brittney [name] primary cause of pain, the treatment plan, treatment alternatives,  medication side effects, the opioid analgesic risks and possible complications, the appropriate use of her medications, the goals of pain management (increased in functionality), the need to bring and keep the BMI below 30, the medication agreement and the patient's responsibilities when it comes to controlled substances.  Provider-requested follow-up: Return in about 3 months (around 01/19/2019) for Medication Management.  Future Appointments  Date Time Provider [place]  04/03/2019 12:45 PM [facility] None  04/03/2019  1:00 PM Lloyd Huger, MD [facility] None  04/03/2019  1:30 PM [facility]FUSION CHAIR 1 [facility] None  09/06/2019  2:15 PM Schnier, Dolores Lory, MD [facility] None    Primary Care Physician: Rusty Aus, MD Location: [facility] Note by: [name], M.D Date: 10/19/2018; Time: 2:40 PM  There are no Patient Instructions on file for this visit.

## 2018-10-28 IMAGING — MR MR LUMBAR SPINE WO/W CM
6 of 7 series · 33 of 48 positions shown · IV contrast (20mL Multihance)
Comparison: CT chest, abdomen and pelvis May 25, 2017 and MRI of
the lumbar spine December 31, 2016

CLINICAL DATA: Low back pain, RIGHT leg numbness with occasional
pain. History of L4-5 laminectomy. Assess prior to placement of
neurostimulator.

EXAM:
MRI THORACIC WITHOUT CONTRAST
MRI LUMBAR SPINE WITHOUT AND WITH CONTRAST
TECHNIQUE: Multiplanar and multiecho pulse sequences of the thoracic spine were
obtained without intravenous contrast.
Multiplanar and multiecho pulse sequences of the thoracic and lumbar
spine were obtained without and with intravenous contrast.
CONTRAST:  20mL MULTIHANCE GADOBENATE DIMEGLUMINE 529 MG/ML IV SOLN

[Series 3: T2 · sagittal · 4.0mm · 0.81mm/px · 5 of 19 slices shown (1 of 2)]
[im 1/19]
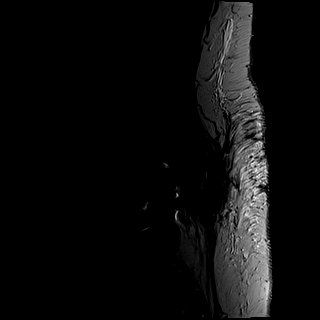
[im 5/19]
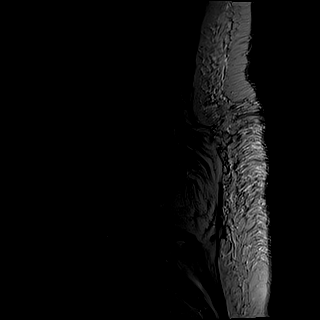
[im 10/19]
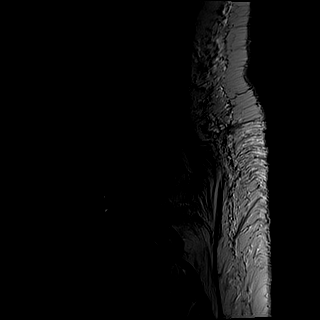
[im 14/19]
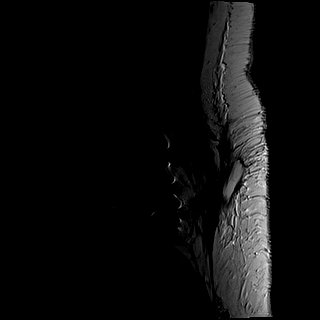
[im 19/19]
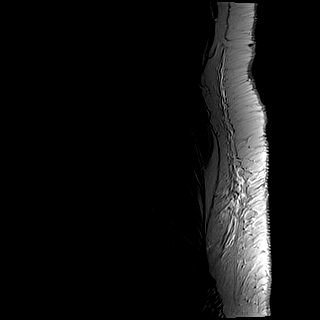

[Series 4: T1 · sagittal · 4.0mm · 0.81mm/px · 5 of 19 slices shown (1 of 2)]
[im 1/19]
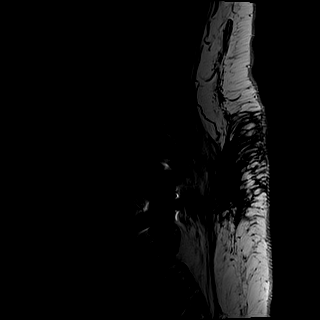
[im 5/19]
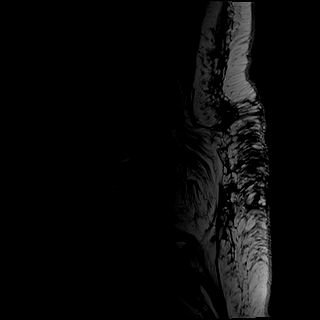
[im 10/19]
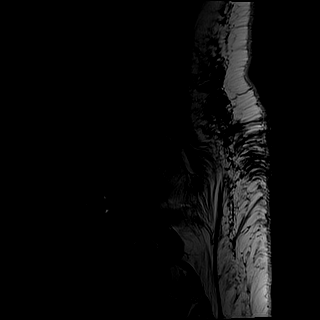
[im 14/19]
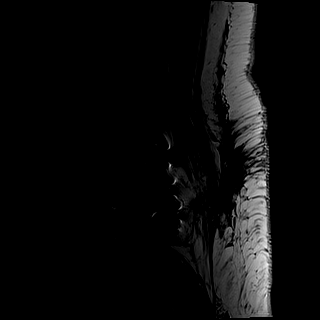
[im 19/19]
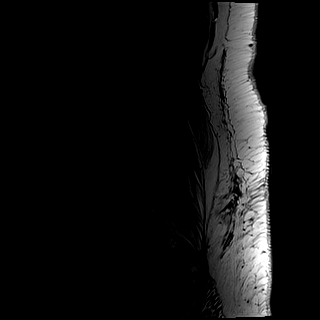

[Series 5: STIR · sagittal · 4.0mm · 1.02mm/px · 3 of 19 slices shown]
[im 1/19]
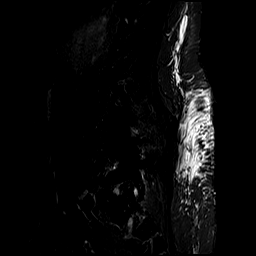
[im 7/19]
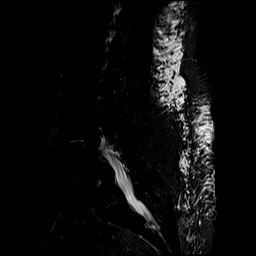
[im 13/19]
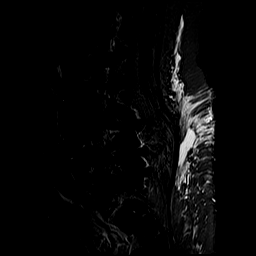

[Series 6: T2 · axial · 4.0mm · 0.78mm/px · z∈[+4,+244]mm · 8 of 43 slices shown (2 of 2)]
[im 1/43]
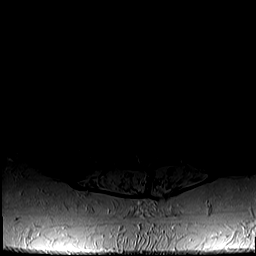
[im 5/43]
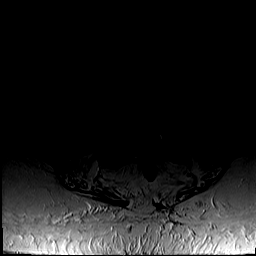
[im 15/43]
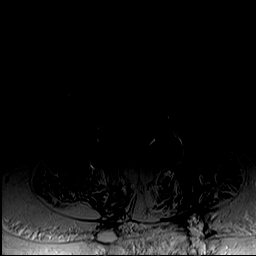
[im 19/43]
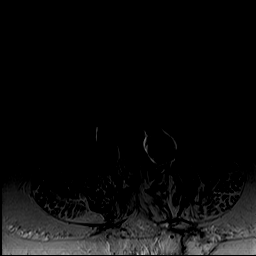
[im 24/43]
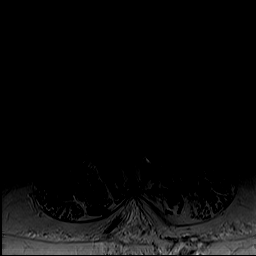
[im 29/43]
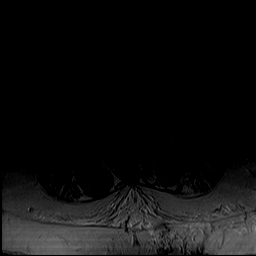
[im 38/43]
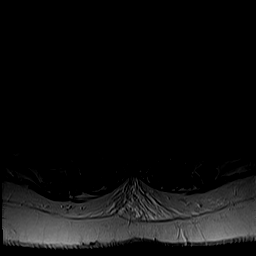
[im 43/43]
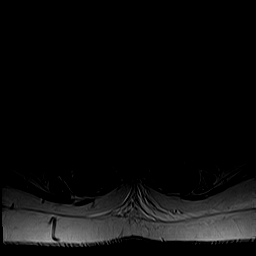

[Series 7: T1 · axial · 4.0mm · 0.39mm/px · z∈[+4,+244]mm · 8 of 43 slices shown (2 of 2)]
[im 1/43]
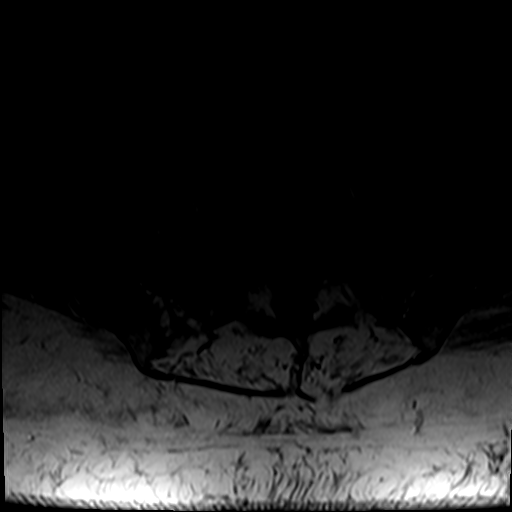
[im 5/43]
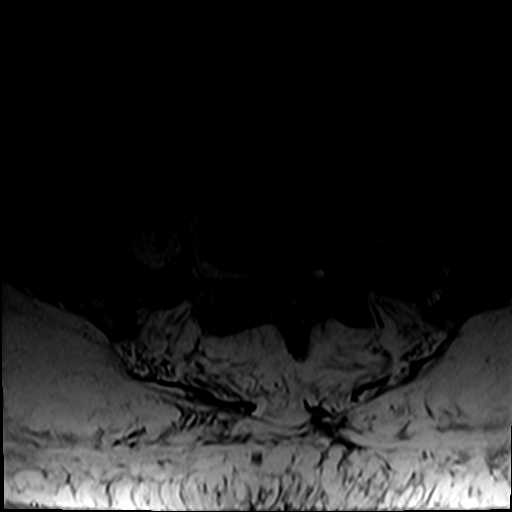
[im 15/43]
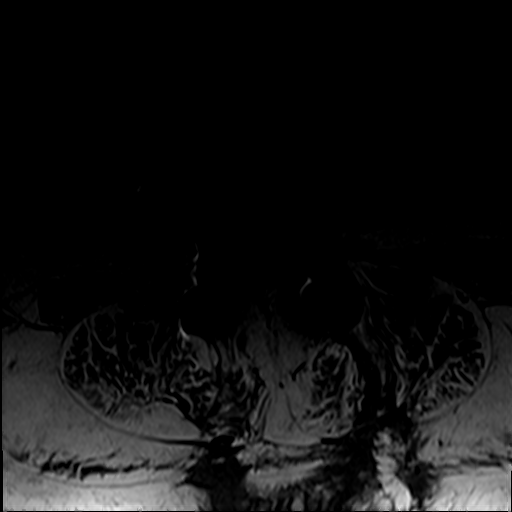
[im 19/43]
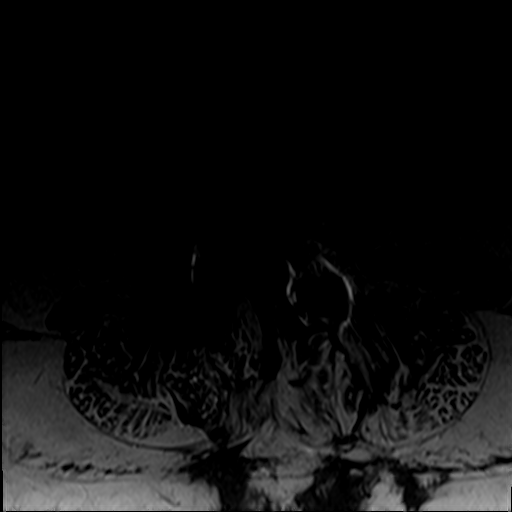
[im 24/43]
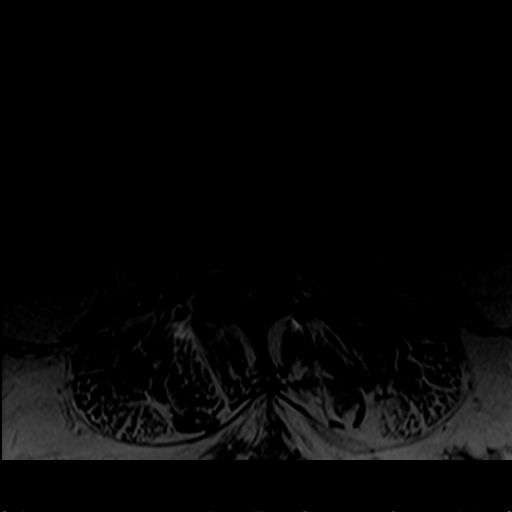
[im 29/43]
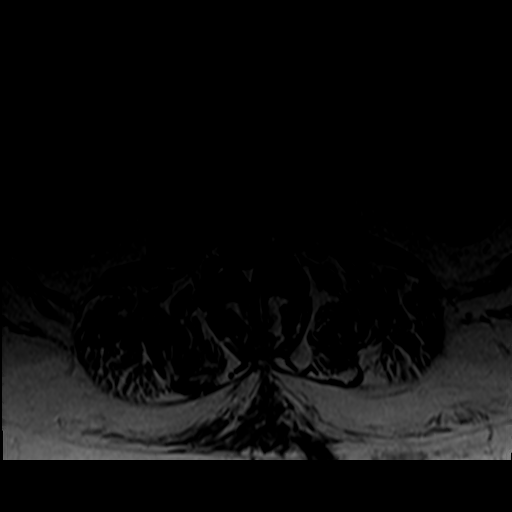
[im 38/43]
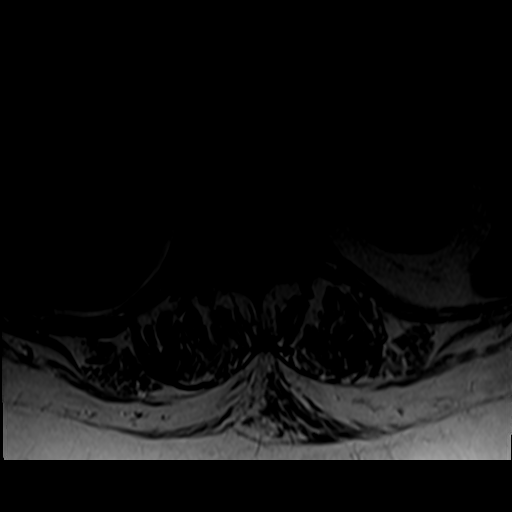
[im 43/43]
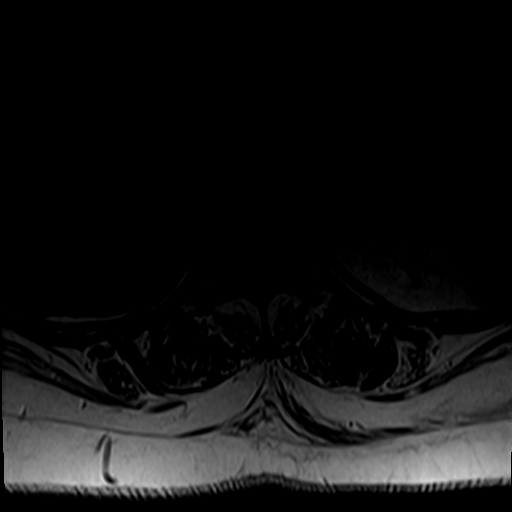

[Series 8: T1 fat-sat post-contrast · sagittal · 4.0mm · 0.81mm/px · 4 of 19 slices shown]
[im 1/19]
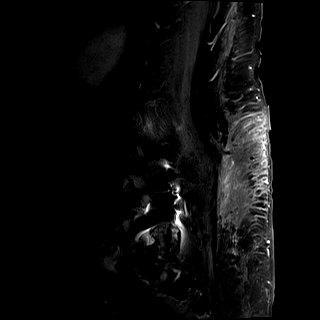
[im 7/19]
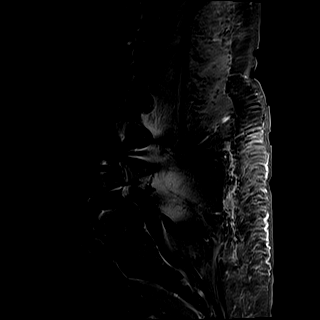
[im 13/19]
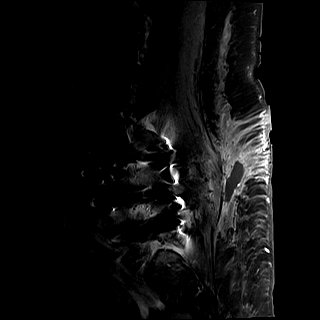
[im 19/19]
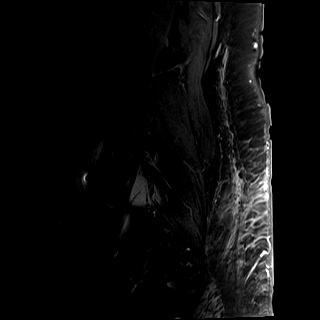

[33 of 48 positions shown; findings below may reference images not displayed]

FINDINGS: MRI THORACIC SPINE FINDINGS

ALIGNMENT: Maintenance of the thoracic kyphosis. No malalignment.

VERTEBRAE/DISCS: Status post C5-6 approximate ACDF, susceptibility
artifact limits precise numbering. Vertebral bodies are intact. Mild
disc desiccation all levels with mild chronic discogenic endplate
changes. Scattered predominately bright T1 and bright STIR signal
hemangioma.

CORD: Thoracic spinal cord is normal morphology and signal
characteristics.

PREVERTEBRAL AND PARASPINAL SOFT TISSUES:  Nonacute.

DISC LEVELS:

T1-2 through T6-7: No disc bulge, canal stenosis nor neural
foraminal narrowing.

T7-8: Small central disc protrusion without canal stenosis or neural
foraminal narrowing.

T8-9 in T9-10: No disc bulge, canal stenosis nor neural foraminal
narrowing.

T10-11: Annular bulging. No canal stenosis or neural foraminal
narrowing.

T11-12 and T12-L1: No disc bulge, canal stenosis nor neural
foraminal narrowing. Mild facet arthropathy.

MRI LUMBAR SPINE FINDINGS

SEGMENTATION: For the purposes of this report, the last well-formed
intervertebral disc will be reported as L5-S1.

ALIGNMENT: Maintained lumbar lordosis. Stable grade 1 L4-5
anterolisthesis.

VERTEBRAE:Vertebral bodies intact. L3 through L5 PLIF, hardware
results in susceptibility artifact. Non surgically altered discs
demonstrate normal morphology, slight desiccation. No abnormal or
acute bone marrow signal. No abnormal osseous or disc enhancement.

CONUS MEDULLARIS: Conus medullaris terminates at T12-L1 and
demonstrates normal morphology and signal characteristics. Cauda
equina is normal. No abnormal cord, leptomeningeal or epidural
enhancement.

PARASPINAL AND SOFT TISSUES: Severe paraspinal muscle atrophy at and
below the level surgical intervention. Newly atrophic RIGHT
iliopsoas muscle.

DISC LEVELS:

L1-2: No disc bulge, canal stenosis nor neural foraminal narrowing.
Mild facet arthropathy.

L2-3: Small similar RIGHT subarticular disc protrusion. Mild facet
arthropathy and ligamentum flavum redundancy. Mild canal stenosis.
Minimal RIGHT neural foraminal narrowing.

L3-4: New PLIF and posterior decompression. New RIGHT lateral plate
and screw fixation, the plate extends 2 cm laterally into the psoas
muscle and may affect the exited RIGHT L3 nerve. Minimal canal
stenosis. No residual synovial cyst. Mild neural foraminal
narrowing.

L4-5: PLIF. Anterolisthesis. Posterior decompression. Mild neural
foraminal narrowing may be overestimated by hardware artifact.

L5-S1: No disc bulge, canal stenosis nor neural foraminal narrowing.
Moderate to severe facet arthropathy with trace effusions which are
likely reactive. No canal stenosis. Mild LEFT neural foraminal
narrowing may be overestimated by hardware artifact.
IMPRESSION: MRI thoracic spine:

1. Degenerative change without canal stenosis or neural foraminal
narrowing.
MRI lumbar spine:

1. Interval L3-4 PLIF. RIGHT lateral L3-4 plate and screw fixation ;
plate has backed [DATE] cm into the iliopsoas muscle and may affect
the exited RIGHT L3 nerve. New RIGHT iliopsoas muscle atrophy.
2. L4-5 PLIF, stable grade 1 L4-5 anterolisthesis.
3. Minimal canal stenosis L3-4, mild at L2-3. Minimal to mild L2-3
through L5-S1 neural foraminal narrowing.

## 2018-10-31 ENCOUNTER — Other Ambulatory Visit: Payer: Self-pay | Admitting: Gastroenterology

## 2018-10-31 DIAGNOSIS — R110 Nausea: Secondary | ICD-10-CM

## 2018-10-31 DIAGNOSIS — K7460 Unspecified cirrhosis of liver: Secondary | ICD-10-CM

## 2018-11-02 ENCOUNTER — Telehealth (INDEPENDENT_AMBULATORY_CARE_PROVIDER_SITE_OTHER): Payer: Self-pay | Admitting: Vascular Surgery

## 2018-11-07 ENCOUNTER — Ambulatory Visit
Admission: RE | Admit: 2018-11-07 | Discharge: 2018-11-07 | Disposition: A | Discharge status: Home / Self Care | Payer: Medicare Other | Source: Ambulatory Visit | Attending: Gastroenterology | Admitting: Gastroenterology

## 2018-11-07 DIAGNOSIS — K7460 Unspecified cirrhosis of liver: Secondary | ICD-10-CM | POA: Diagnosis not present

## 2018-11-07 DIAGNOSIS — K760 Fatty (change of) liver, not elsewhere classified: Secondary | ICD-10-CM | POA: Insufficient documentation

## 2019-01-03 ENCOUNTER — Other Ambulatory Visit: Payer: Self-pay

## 2019-01-03 ENCOUNTER — Encounter: Payer: Self-pay | Admitting: Student in an Organized Health Care Education/Training Program

## 2019-01-03 ENCOUNTER — Ambulatory Visit
Payer: Medicare Other | Attending: Student in an Organized Health Care Education/Training Program | Admitting: Student in an Organized Health Care Education/Training Program

## 2019-01-03 VITALS — BP 178/80 | HR 68 | Temp 98.3°F | Resp 18 | Ht 66.0 in | Wt 302.0 lb

## 2019-01-03 DIAGNOSIS — M792 Neuralgia and neuritis, unspecified: Secondary | ICD-10-CM

## 2019-01-03 DIAGNOSIS — M961 Postlaminectomy syndrome, not elsewhere classified: Secondary | ICD-10-CM | POA: Insufficient documentation

## 2019-01-03 DIAGNOSIS — M48061 Spinal stenosis, lumbar region without neurogenic claudication: Secondary | ICD-10-CM | POA: Insufficient documentation

## 2019-01-03 DIAGNOSIS — M4804 Spinal stenosis, thoracic region: Secondary | ICD-10-CM | POA: Insufficient documentation

## 2019-01-03 DIAGNOSIS — Z9889 Other specified postprocedural states: Secondary | ICD-10-CM | POA: Insufficient documentation

## 2019-01-03 DIAGNOSIS — M797 Fibromyalgia: Secondary | ICD-10-CM

## 2019-01-03 DIAGNOSIS — M5416 Radiculopathy, lumbar region: Secondary | ICD-10-CM

## 2019-01-03 DIAGNOSIS — M47816 Spondylosis without myelopathy or radiculopathy, lumbar region: Secondary | ICD-10-CM | POA: Insufficient documentation

## 2019-01-03 DIAGNOSIS — Z9689 Presence of other specified functional implants: Secondary | ICD-10-CM | POA: Insufficient documentation

## 2019-01-03 DIAGNOSIS — G894 Chronic pain syndrome: Secondary | ICD-10-CM | POA: Diagnosis not present

## 2019-01-03 MED ORDER — OXYCODONE-ACETAMINOPHEN 10-325 MG PO TABS: 1.0000 | ORAL_TABLET | Freq: Two times a day (BID) | ORAL | 0 refills | Status: AC | PRN

## 2019-01-03 MED ORDER — OXYCODONE-ACETAMINOPHEN 10-325 MG PO TABS: 1.0000 | ORAL_TABLET | Freq: Two times a day (BID) | ORAL | 0 refills | Status: DC | PRN

## 2019-01-03 MED ORDER — DICLOFENAC SODIUM 1 % TD GEL: 4.0000 g | Freq: Four times a day (QID) | TRANSDERMAL | 2 refills | Status: AC

## 2019-01-03 NOTE — Patient Instructions (Signed)
____________________________________________________________________________________________  Medication Rules  Applies to: All patients receiving prescriptions (written or electronic).  Pharmacy of record: Pharmacy where electronic prescriptions will be sent. If written prescriptions are taken to a different pharmacy, please inform the nursing staff. The pharmacy listed in the electronic medical record should be the one where you would like electronic prescriptions to be sent.  Prescription refills: Only during scheduled appointments. Applies to both, written and electronic prescriptions.  NOTE: The following applies primarily to controlled substances (Opioid* Pain Medications).   Patient's responsibilities: 1. Pain Pills: Bring all pain pills to every appointment (except for procedure appointments). 2. Pill Bottles: Bring pills in original pharmacy bottle. Always bring newest bottle. Bring bottle, even if empty. 3. Medication refills: You are responsible for knowing and keeping track of what medications you need refilled. The day before your appointment, write a list of all prescriptions that need to be refilled. Bring that list to your appointment and give it to the admitting nurse. Prescriptions will be written only during appointments. If you forget a medication, it will not be "Called in", "Faxed", or "electronically sent". You will need to get another appointment to get these prescribed. 4. Prescription Accuracy: You are responsible for carefully inspecting your prescriptions before leaving our office. Have the discharge nurse carefully go over each prescription with you, before taking them home. Make sure that your name is accurately spelled, that your address is correct. Check the name and dose of your medication to make sure it is accurate. Check the number of pills, and the written instructions to make sure they are clear and accurate. Make sure that you are given enough medication to last  until your next medication refill appointment. 5. Taking Medication: Take medication as prescribed. Never take more pills than instructed. Never take medication more frequently than prescribed. Taking less pills or less frequently is permitted and encouraged, when it comes to controlled substances (written prescriptions).  6. Inform other Doctors: Always inform, all of your healthcare providers, of all the medications you take. 7. Pain Medication from other Providers: You are not allowed to accept any additional pain medication from any other Doctor or Healthcare provider. There are two exceptions to this rule. (see below) In the event that you require additional pain medication, you are responsible for notifying us, as stated below. 8. Medication Agreement: You are responsible for carefully reading and following our Medication Agreement. This must be signed before receiving any prescriptions from our practice. Safely store a copy of your signed Agreement. Violations to the Agreement will result in no further prescriptions. (Additional copies of our Medication Agreement are available upon request.) 9. Laws, Rules, & Regulations: All patients are expected to follow all Federal and State Laws, Statutes, Rules, & Regulations. Ignorance of the Laws does not constitute a valid excuse. The use of any illegal substances is prohibited. 10. Adopted CDC guidelines & recommendations: Target dosing levels will be at or below 60 MME/day. Use of benzodiazepines** is not recommended.  Exceptions: There are only two exceptions to the rule of not receiving pain medications from other Healthcare Providers. 1. Exception #1 (Emergencies): In the event of an emergency (i.e.: accident requiring emergency care), you are allowed to receive additional pain medication. However, you are responsible for: As soon as you are able, call our office [phone], at any time of the day or night, and leave a message stating your name, the  date and nature of the emergency, and the name and dose of the medication   prescribed. In the event that your call is answered by a member of our staff, make sure to document and save the date, time, and the name of the person that took your information.  2. Exception #2 (Planned Surgery): In the event that you are scheduled by another doctor or dentist to have any type of surgery or procedure, you are allowed (for a period no longer than 30 days), to receive additional pain medication, for the acute post-op pain. However, in this case, you are responsible for picking up a copy of our "Post-op Pain Management for Surgeons" handout, and giving it to your surgeon or dentist. This document is available at our office, and does not require an appointment to obtain it. Simply go to our office during business hours (Monday-Thursday from 8:00 AM to 4:00 PM) (Friday 8:00 AM to 12:00 Noon) or if you have a scheduled appointment with us, prior to your surgery, and ask for it by name. In addition, you will need to provide us with your name, name of your surgeon, type of surgery, and date of procedure or surgery.  *Opioid medications include: morphine, codeine, oxycodone, oxymorphone, hydrocodone, hydromorphone, meperidine, tramadol, tapentadol, buprenorphine, fentanyl, methadone. **Benzodiazepine medications include: diazepam (Valium), alprazolam (Xanax), clonazepam (Klonopine), lorazepam (Ativan), clorazepate (Tranxene), chlordiazepoxide (Librium), estazolam (Prosom), oxazepam (Serax), temazepam (Restoril), triazolam (Halcion) (Last updated: 02/23/2018) ____________________________________________________________________________________________    

## 2019-01-03 NOTE — Progress Notes (Signed)
Nursing Pain Medication Assessment:  Safety precautions to be maintained throughout the outpatient stay will include: orient to surroundings, keep bed in low position, maintain call bell within reach at all times, provide assistance with transfer out of bed and ambulation.  Medication Inspection Compliance: Pill count conducted under aseptic conditions, in front of the patient. Neither the pills nor the bottle was removed from the patient's sight at any time. Once count was completed pills were immediately returned to the patient in their original bottle.  Medication: Oxycodone/APAP Pill/Patch Count: 52 of 60 pills remain Pill/Patch Appearance: Markings consistent with prescribed medication Bottle Appearance: Standard pharmacy container. Clearly labeled. Filled Date: 30 / 31 / 2019 Last Medication intake:  Today

## 2019-01-03 NOTE — Progress Notes (Signed)
Patient's Name: Brittney [name]  MRN: [id number]  Referring Provider: Rusty Aus, MD  DOB: 09/13/1951  PCP: Rusty Aus, MD  DOS: 01/03/2019  Note by: [name], MD  Service setting: Ambulatory outpatient  Specialty: Interventional Pain Management  Location: [facility] (AMB) Pain Management Facility    Patient type: Established   Primary Reason(s) for Visit: Encounter for prescription drug management. (Level of risk: moderate)  CC: Medication Refill; Back Pain; and Leg Pain  HPI  Ms. Luba is a 68 y.o. year old, female patient, who comes today for a medication management evaluation. She has Abnormal tumor markers; Chronic kidney disease, stage II (mild); Degenerative spondylolisthesis; Diabetes mellitus with peripheral vascular disease ([place]); Diabetes with retinopathy ([place]); Diabetic nephropathy ([place]); DM (diabetes mellitus) type II uncontrolled, periph vascular disorder ([place]); Fibromyalgia; Gastroparesis due to DM ([place]); Generalized osteoarthritis of multiple sites; Hepatic cirrhosis ([place]); Hyperlipemia; Lung nodule, multiple; Microalbuminuria; OSA (obstructive sleep apnea); Seronegative arthritis; Essential hypertension; L3-4 severe lumbar spinal stenosis (12/31/2016 MRI); Type 2 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, with long-term current use of insulin ([place]); Unilateral small kidney; Long term current use of opiate analgesic; Long term prescription opiate use; Opiate use; Chronic pain syndrome; Morbid obesity with BMI of 40.0-44.9, adult ([place]); Type 2 diabetes mellitus with diabetic nephropathy, with long-term current use of insulin ([place]); Chronic low back pain (Location of Primary Source of Pain) (Bilateral) (R>L); 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); Chronic lower extremity pain (Location of Secondary source of pain) (Bilateral) (R>L); Chronic knee pain (Location of Tertiary source of pain) (Right); Osteoarthritis of knee  (Right); Grade 1 Anterolisthesis of L3 over L4 and L4 over L5; Osteoarthritis of sacroiliac joint (Right); L3-4 severe lumbar facet hypertrophy and spinal stenosis; Neurogenic pain; Long term prescription benzodiazepine use; Vitamin D insufficiency; Iron deficiency anemia; Back pain; Status post lumbar spine surgery for decompression of spinal cord; Myofascial pain; Spinal stenosis of thoracic region; Lymphedema; and Chronic venous insufficiency on their problem list. Her primarily concern today is the Medication Refill; Back Pain; and Leg Pain  Pain Assessment: Location: Lower Back Radiating: Denies however has seperate pain in legs bilateral, right leg is much worse  Onset: More than a month ago Duration: Chronic pain Quality: Shooting, Burning, Aching Severity: 6 /10 (subjective, self-reported pain score)  Note: Reported level is inconsistent with clinical observations.                         When using our objective Pain Scale, levels between 6 and 10/10 are said to belong in an emergency room, as it progressively worsens from a 6/10, described as severely limiting, requiring emergency care not usually available at an outpatient pain management facility. At a 6/10 level, communication becomes difficult and requires great effort. Assistance to reach the emergency department may be required. Facial flushing and profuse sweating along with potentially dangerous increases in heart rate and blood pressure will be evident. Effect on ADL: "Difficult to get up and walk around much. Can;t stand up for too long"  Timing: Constant Modifying factors: Oxycodone with ibuprofen along with rest and sitting  BP: (!) 178/80  HR: 68  Ms. Betters was last scheduled for an appointment on 10/19/2018 for medication management. During today's appointment we reviewed Ms. Mcelhinney's chronic pain status, as well as her outpatient medication regimen.  The patient  reports no history of drug use. Her body mass index is  48.74 kg/m.  Further details on both, my assessment(s), as well as the proposed treatment plan, please see below.  Controlled Substance Pharmacotherapy Assessment REMS (Risk Evaluation and Mitigation Strategy)  Analgesic: Oxycodone 10 mg BID prn, #60/ month. Tramadol 100-150 mg daily prn. MME/day: 30-50 mg/day.  Janne Napoleon, RN  01/03/2019  2:08 PM  Sign when Signing Visit Nursing Pain Medication Assessment:  Safety precautions to be maintained throughout the outpatient stay will include: orient to surroundings, keep bed in low position, maintain call bell within reach at all times, provide assistance with transfer out of bed and ambulation.  Medication Inspection Compliance: Pill count conducted under aseptic conditions, in front of the patient. Neither the pills nor the bottle was removed from the patient's sight at any time. Once count was completed pills were immediately returned to the patient in their original bottle.  Medication: Oxycodone/APAP Pill/Patch Count: 52 of 60 pills remain Pill/Patch Appearance: Markings consistent with prescribed medication Bottle Appearance: Standard pharmacy container. Clearly labeled. Filled Date: 84 / 31 / 2019 Last Medication intake:  Today   Pharmacokinetics: Liberation and absorption (onset of action): WNL Distribution (time to peak effect): WNL Metabolism and excretion (duration of action): WNL         Pharmacodynamics: Desired effects: Analgesia: Ms. Flannery reports >50% benefit. Functional ability: Patient reports that medication allows her to accomplish basic ADLs Clinically meaningful improvement in function (CMIF): Sustained CMIF goals met Perceived effectiveness: Described as relatively effective, allowing for increase in activities of daily living (ADL) Undesirable effects: Side-effects or Adverse reactions: None reported Monitoring: [place] PMP: Online review of the past 40-monthperiod conducted. Compliant with practice rules and  regulations Last UDS on record: Summary  Date Value Ref Range Status  12/07/2016 FINAL  Final    Comment:    ==================================================================== TOXASSURE COMP DRUG ANALYSIS,UR ==================================================================== Test                             Result       Flag       Units Drug Present and Declared for Prescription Verification   Alprazolam                     350          EXPECTED   ng/mg creat   Alpha-hydroxyalprazolam        1573         EXPECTED   ng/mg creat    Source of alprazolam is a scheduled prescription medication.    Alpha-hydroxyalprazolam is an expected metabolite of alprazolam.   Butalbital                     PRESENT      EXPECTED   Tramadol                       PRESENT      EXPECTED   O-Desmethyltramadol            PRESENT      EXPECTED   N-Desmethyltramadol            PRESENT      EXPECTED    Source of tramadol is a prescription medication.    O-desmethyltramadol and N-desmethyltramadol are expected    metabolites of tramadol.   Gabapentin                     PRESENT  EXPECTED   Cyclobenzaprine                PRESENT      EXPECTED   Citalopram                     PRESENT      EXPECTED   Desmethylcitalopram            PRESENT      EXPECTED    Desmethylcitalopram is an expected metabolite of citalopram or    the enantiomeric form, escitalopram.   Trazodone                      PRESENT      EXPECTED   1,3 chlorophenyl piperazine    PRESENT      EXPECTED    1,3-chlorophenyl piperazine is an expected metabolite of    trazodone.   Acetaminophen                  PRESENT      EXPECTED   Ibuprofen                      PRESENT      EXPECTED   Atenolol                       PRESENT      EXPECTED Drug Present not Declared for Prescription Verification   Lorazepam                      602          UNEXPECTED ng/mg creat    Source of lorazepam is a scheduled prescription medication. Drug Absent but  Declared for Prescription Verification   Zolpidem                       Not Detected UNEXPECTED    Zolpidem, as indicated in the declared medication list, is not    always detected even when used as directed.   Diclofenac                     Not Detected UNEXPECTED    Diclofenac, as indicated in the declared medication list, is not    always detected even when used as directed.   Lidocaine                      Not Detected UNEXPECTED    Lidocaine, as indicated in the declared medication list, is not    always detected even when used as directed.   Clonidine                      Not Detected UNEXPECTED ==================================================================== Test                      Result    Flag   Units      Ref Range   Creatinine              101              mg/dL      >=20 ==================================================================== Declared Medications:  The flagging and interpretation on this report are based on the  following declared medications.  Unexpected results may arise from  inaccuracies in the declared medications.  **Note: The testing scope of this panel includes  these medications:  Alprazolam (Xanax)  Atenolol  Butalbital (Fioricet)  Citalopram (Lexapro)  Clonidine  Cyclobenzaprine (Flexeril)  Gabapentin  Tramadol (Ultram)  Trazodone (Desyrel)  **Note: The testing scope of this panel does not include small to  moderate amounts of these reported medications:  Acetaminophen (Fioricet)  Diclofenac (Voltaren)  Ibuprofen  Lidocaine (Lidoderm)  Zolpidem (Ambien)  **Note: The testing scope of this panel does not include following  reported medications:  Albuterol  Doxazosin (Cardura)  Eye Drops  Fluticasone (Flonase)  Hydralazine (Apresoline)  Hydrochlorothiazide  Insulin (Humulin)  Insulin (Levemir)  Levofloxacin (Levaquin)  Linaclotide (Linzess)  Lisinopril  Metformin  Metoclopramide (Reglan)  Ondansetron (Zofran)  Pantoprazole  (Protonix)  Potassium  Pramipexole (Mirapex)  Torsemide (Demadex) ==================================================================== For clinical consultation, please call [phone]. ====================================================================    UDS interpretation: Compliant          Medication Assessment Form: Reviewed. Patient indicates being compliant with therapy Treatment compliance: Compliant Risk Assessment Profile: Aberrant behavior: See prior evaluations. None observed or detected today Comorbid factors increasing risk of overdose: See prior notes. No additional risks detected today Opioid risk tool (ORT) (Total Score): 0 Personal History of Substance Abuse (SUD-Substance use disorder):  Alcohol: Negative  Illegal Drugs: Negative  Rx Drugs: Negative  ORT Risk Level calculation: Low Risk Risk of substance use disorder (SUD): Low Opioid Risk Tool - 01/03/19 1412      Family History of Substance Abuse   Alcohol  Negative    Illegal Drugs  Negative    Rx Drugs  Negative      Personal History of Substance Abuse   Alcohol  Negative    Illegal Drugs  Negative    Rx Drugs  Negative      Age   Age between 58-45 years   No      History of Preadolescent Sexual Abuse   History of Preadolescent Sexual Abuse  Negative or Female      Psychological Disease   Psychological Disease  Negative    Depression  Negative      Total Score   Opioid Risk Tool Scoring  0    Opioid Risk Interpretation  Low Risk      ORT Scoring interpretation table:  Score <3 = Low Risk for SUD  Score between 4-7 = Moderate Risk for SUD  Score >8 = High Risk for Opioid Abuse   Risk Mitigation Strategies:  Patient Counseling: Covered Patient-Prescriber Agreement (PPA): Present and active  Notification to other healthcare providers: Done  Pharmacologic Plan: No change in therapy, at this time.             Laboratory Chemistry  Inflammation Markers (CRP: Acute Phase) (ESR:  Chronic Phase) Lab Results  Component Value Date   CRP 1.2 (H) 12/07/2016   ESRSEDRATE 56 (H) 12/07/2016                         Rheumatology Markers No results found for: RF, ANA, LABURIC, URICUR, LYMEIGGIGMAB, LYMEABIGMQN, HLAB27                      Renal Function Markers Lab Results  Component Value Date   BUN 14 10/28/2017   CREATININE 0.60 10/28/2017   GFRAA >60 10/28/2017   GFRNONAA >60 10/28/2017                             Hepatic Function Markers Lab  Results  Component Value Date   AST 29 10/28/2017   ALT 24 10/28/2017   ALBUMIN 3.4 (L) 10/28/2017   ALKPHOS 103 10/28/2017   LIPASE 114 10/22/2014                        Electrolytes Lab Results  Component Value Date   NA 137 10/28/2017   K 3.8 10/28/2017   CL 101 10/28/2017   CALCIUM 9.5 10/28/2017   MG 1.8 12/07/2016                        Neuropathy Markers Lab Results  Component Value Date   PZWCHENI77 824 12/07/2016   HGBA1C 9.3 (H) 10/23/2014                        CNS Tests No results found for: COLORCSF, APPEARCSF, RBCCOUNTCSF, WBCCSF, POLYSCSF, LYMPHSCSF, EOSCSF, PROTEINCSF, GLUCCSF, JCVIRUS, CSFOLI, IGGCSF                      Bone Pathology Markers Lab Results  Component Value Date   25OHVITD1 26 (L) 12/07/2016   25OHVITD2 1.2 12/07/2016   25OHVITD3 25 12/07/2016                         Coagulation Parameters Lab Results  Component Value Date   INR 0.95 05/23/2018   LABPROT 12.6 05/23/2018   APTT 34.9 10/22/2014   PLT 177 09/26/2018                        Cardiovascular Markers Lab Results  Component Value Date   CKTOTAL 161 10/23/2014   CKMB 1.0 10/23/2014   TROPONINI 0.06 (H) 10/23/2014   HGB 11.4 (L) 09/26/2018   HCT 34.8 (L) 09/26/2018                         CA Markers No results found for: CEA, CA125, LABCA2                      Note: Lab results reviewed.  Recent Diagnostic Imaging Results  US ABDOMEN RUQ W/ELASTOGRAPHY CLINICAL DATA:  Hepatic  cirrhosis.  EXAM: US ABDOMEN LIMITED - RIGHT UPPER QUADRANT  ULTRASOUND HEPATIC ELASTOGRAPHY  TECHNIQUE: Limited right upper quadrant abdominal ultrasound was performed. In addition, ultrasound elastography evaluation of the liver was performed. A region of interest was placed in the right lobe of the liver. Following application of a compressive sonographic pulse, shear waves were detected in the adjacent hepatic tissue and the shear wave velocity was calculated. Multiple assessments were performed at the selected site. Median shear wave velocity is correlated to a Metavir fibrosis score.  COMPARISON:  Ultrasound on 10/13/2018  FINDINGS: ULTRASOUND ABDOMEN LIMITED RIGHT UPPER QUADRANT  Gallbladder:  Surgically absent.  Common bile duct:  Diameter: 10 mm, within normal limits status post cholecystectomy.  Liver:  Limited visualization due to diffusely increased echogenicity and poor acoustic penetration. No definite liver mass identified. Diffusely increased echogenicity is consistent with hepatic cirrhosis. Portal vein is patent on color Doppler imaging with normal direction of blood flow towards the liver.  ULTRASOUND HEPATIC ELASTOGRAPHY  Device: Siemens Helix VTQ  Patient position: Supine  Transducer DAX  Number of measurements: 10  Hepatic segment:  8  Median velocity:   1.44 m/sec  IQR:  0.25  IQR/Median velocity ratio: 0.17  Corresponding Metavir fibrosis score:  F2 + some F3  Risk of fibrosis: Moderate  Limitations of exam: Morbid obesity  Please note that abnormal shear wave velocities may also be identified in clinical settings other than with hepatic fibrosis, such as: acute hepatitis, elevated right heart and central venous pressures including use of beta blockers, veno-occlusive disease (Budd-Chiari), infiltrative processes such as mastocytosis/amyloidosis/infiltrative tumor, extrahepatic cholestasis, in the post-prandial state, and  liver transplantation. Correlation with patient history, laboratory data, and clinical condition recommended.  IMPRESSION: ULTRASOUND ABDOMEN:  Diffuse hepatic steatosis. Limited visualization of liver, however no mass identified.  ULTRASOUND HEPATIC ELASTOGRAHY:  Median hepatic shear wave velocity is calculated at 1.44 m/sec.  Corresponding Metavir fibrosis score is F2 + some F3.  Risk of fibrosis is Moderate.  Follow-up: Additional testing appropriate  Electronically Signed   By: Earle Gell M.D.   On: 11/07/2018 10:16  Complexity Note: Imaging results reviewed. Results shared with Ms. Teater, using Layman's terms.                         Meds   Current Outpatient Medications:  .  traMADol (ULTRAM) 50 MG tablet, Take 100 mg by mouth 3 (three) times daily., Disp: , Rfl: 3 .  albuterol (VENTOLIN HFA) 108 (90 Base) MCG/ACT inhaler, TAKE 2 PUFFS BY MOUTH 3 TIMES A DAY AS NEEDED FOR SHORTNESS OF BREATH, Disp: , Rfl:  .  ALPRAZolam (XANAX) 0.5 MG tablet, Take 0.5 mg by mouth 4 (four) times daily. , Disp: , Rfl:  .  bumetanide (BUMEX) 1 MG tablet, Take by mouth daily. , Disp: , Rfl:  .  butalbital-acetaminophen-caffeine (FIORICET, ESGIC) 50-325-40 MG tablet, TAKE ONE TABLET BY MOUTH ONCE DAILY, Disp: , Rfl:  .  candesartan (ATACAND) 32 MG tablet, Take 1 tablet (32 mg total) by mouth daily., Disp: 30 tablet, Rfl: 6 .  carvedilol (COREG) 25 MG tablet, Take 37.5-50 mg by mouth 2 (two) times daily with a meal. TAKES 2 TABLETS IN THE MORNING AND 1.5 TABLETS IN THE EVENING, Disp: , Rfl:  .  cloNIDine (CATAPRES - DOSED IN MG/24 HR) 0.3 mg/24hr patch, Place 0.3 mg onto the skin once a week. , Disp: , Rfl:  .  cyclobenzaprine (FLEXERIL) 10 MG tablet, Take 20 mg by mouth at bedtime., Disp: , Rfl:  .  diclofenac sodium (VOLTAREN) 1 % GEL, Apply 4 g topically as needed (PAIN). , Disp: , Rfl:  .  diclofenac sodium (VOLTAREN) 1 % GEL, Apply 4 g topically 4 (four) times daily., Disp: 100 g, Rfl:  2 .  doxazosin (CARDURA) 4 MG tablet, Take 4 mg by mouth at bedtime. , Disp: , Rfl:  .  escitalopram (LEXAPRO) 10 MG tablet, Take 10 mg by mouth daily. , Disp: , Rfl:  .  fluticasone (FLONASE) 50 MCG/ACT nasal spray, PLACE 2 SPRAYS INTO BOTH NOSTRILS 2 (TWO) TIMES DAILY AS NEEDED FOR ALLERGIES, Disp: , Rfl:  .  fluticasone (FLOVENT HFA) 220 MCG/ACT inhaler, TAKE 2 PUFFS BY MOUTH TWICE A DAY, Disp: 36 Inhaler, Rfl: 1 .  gabapentin (NEURONTIN) 600 MG tablet, Take 1 tablet (600 mg total) by mouth 6 (six) times daily. (Patient taking differently: Take 600 mg by mouth See admin instructions. ), Disp: 540 tablet, Rfl: 0 .  hydrALAZINE (APRESOLINE) 25 MG tablet, Take 25 mg by mouth at bedtime. , Disp: , Rfl:  .  ibuprofen (ADVIL,MOTRIN) 800 MG tablet, Take 800 mg  by mouth 3 (three) times daily as needed. , Disp: , Rfl:  .  Insulin Detemir (LEVEMIR FLEXTOUCH) 100 UNIT/ML Pen, INJECT 90 UNITS SUBCUTANEOUSLY TWICE A DAY, Disp: , Rfl:  .  Insulin Pen Needle (FIFTY50 PEN NEEDLES) 32G X 6 MM MISC, 5 (five) times daily., Disp: , Rfl:  .  insulin regular human CONCENTRATED (HUMULIN R) 500 UNIT/ML kwikpen, Inject 70-75 Units into the skin 3 (three) times daily with meals. PER SLIDING SCALE, Disp: , Rfl:  .  ketoconazole (NIZORAL) 2 % shampoo, APPLY TO AFFECTED AREAS (LEAVE ON FOR 5 MINUTES) , RINSE WELL, USE 2-3 TIMES PER WEEK., Disp: , Rfl: 11 .  lactulose (CHRONULAC) 10 GM/15ML solution, Take 10 g by mouth daily as needed for mild constipation., Disp: , Rfl:  .  levothyroxine (SYNTHROID, LEVOTHROID) 150 MCG tablet, Take 150 mcg by mouth daily before breakfast. , Disp: , Rfl: 11 .  lidocaine (LIDODERM) 5 %, PLACE 1 PATCH ONTO THE MOST PAINFUL AREA OF SKIN DAILY FOR UP TO 12 HOURS IN A 24 HOUR PERIOD, Disp: , Rfl: 5 .  lubiprostone (AMITIZA) 24 MCG capsule, TAKE 1 CAPSULE (24 MCG TOTAL) BY MOUTH 2 (TWO) TIMES DAILY WITH MEALS., Disp: , Rfl:  .  metoCLOPramide (REGLAN) 10 MG tablet, Take 10 mg by mouth 3 (three)  times daily., Disp: , Rfl:  .  [START ON 01/25/2019] oxyCODONE-acetaminophen (PERCOCET) 10-325 MG tablet, Take 1 tablet by mouth 2 (two) times daily as needed for pain., Disp: 60 tablet, Rfl: 0 .  [START ON 02/24/2019] oxyCODONE-acetaminophen (PERCOCET) 10-325 MG tablet, Take 1 tablet by mouth 2 (two) times daily as needed for pain., Disp: 60 tablet, Rfl: 0 .  pantoprazole (PROTONIX) 40 MG tablet, TAKE 1 TABLET (40 MG TOTAL) BY MOUTH 2 (TWO) TIMES DAILY. TAKE ONE HOUR BEFORE A MEAL, Disp: , Rfl:  .  potassium chloride (K-DUR,KLOR-CON) 10 MEQ tablet, TAKE 1 TABLET BY MOUTH ONCE A DAY, Disp: , Rfl:  .  pramipexole (MIRAPEX) 0.5 MG tablet, TAKE 2 TABLETS BY MOUTH AT BEDTIME, Disp: , Rfl:  .  sucralfate (CARAFATE) 1 g tablet, Take 1 tablet by mouth daily., Disp: , Rfl:  .  traMADol (ULTRAM) 50 MG tablet, Take 2 tablets (100 mg total) by mouth every 8 (eight) hours., Disp: 180 tablet, Rfl: 2 .  traZODone (DESYREL) 150 MG tablet, Take 150 mg by mouth at bedtime. , Disp: , Rfl:  .  zolpidem (AMBIEN) 10 MG tablet, Take 10 mg by mouth at bedtime. , Disp: , Rfl:   ROS  Constitutional: Denies any fever or chills Gastrointestinal: No reported hemesis, hematochezia, vomiting, or acute GI distress Musculoskeletal: Denies any acute onset joint swelling, redness, loss of ROM, or weakness Neurological: No reported episodes of acute onset apraxia, aphasia, dysarthria, agnosia, amnesia, paralysis, loss of coordination, or loss of consciousness  Allergies  Ms. Leveille is allergic to eggs or egg-derived products; shellfish allergy; amlodipine; codeine; imdur [isosorbide dinitrate]; lyrica [pregabalin]; monosodium glutamate; and tape.  PFSH  Drug: Ms. Stanfill  reports no history of drug use. Alcohol:  reports no history of alcohol use. Tobacco:  reports that she has never smoked. She has never used smokeless tobacco. Medical:  has a past medical history of Abdominal pain, left upper quadrant (12/11/2012), Arthritis,  Asthma, Broken leg (2014), Cancer ([place]) (2007), Chronic kidney disease, stage 2 (mild), Chronic lower back pain, Collagen vascular disease ([place]), Coronary artery disease, Diabetes mellitus without complication ([place]), Diabetic nephropathy associated with secondary diabetes mellitus ([place]), Flushing (  12/11/2012), Gastroparesis, Gross hematuria (12/11/2012), Hepatic cirrhosis ([place]), Hyperlipidemia, Hypertension, Hypothyroidism, Hypothyroidism, IBS (irritable bowel syndrome), Kidney stone (12/11/2012), Lower extremity edema, Morbid obesity with BMI of 40.0-44.9, adult ([place]), Myocardial infarction ([place]) (09/2013), Nausea without vomiting (12/11/2012), Nephrolithiasis, Nephrolithiasis (04/26/2014), Nonproliferative retinopathy due to secondary diabetes ([place]), Numbness and tingling of right leg, Peripheral vascular disease ([place]), Renal colic (08/65/7846), Sciatica, Sciatica (12/11/2012), Skin cancer (08/2018), Sleep apnea, and Unilateral small kidney without contralateral hypertrophy. Surgical: Ms. Primiano  has a past surgical history that includes Cesarean section (1980); Dilation and curettage of uterus; Eye surgery (1986); Abdominal hysterectomy; Tonsillectomy; Esophagogastroduodenoscopy (02/08/2014); Colonoscopy (05/04/2001); Esophagogastroduodenoscopy (egd) with propofol (N/A, 09/20/2016); Colonoscopy with propofol (N/A, 09/20/2016); Mohs surgery (11/2016); Cholecystectomy; Hernia repair; Joint replacement (Left, 09/24/2013); Back surgery (04/1999); Pulse generator implant (N/A, 11/02/2017); and Esophagogastroduodenoscopy (egd) with propofol (N/A, 05/23/2018). Family: family history includes Asthma in her mother; Cancer in her mother; Diabetes in her father; Heart disease in her mother; Hypertension in her father; Kidney disease in her father.  Constitutional Exam  General appearance: Well nourished, well developed, and well hydrated. In no apparent acute distress Vitals:   01/03/19 1356  BP: (!) 178/80  Pulse: 68   Resp: 18  Temp: 98.3 F (36.8 C)  SpO2: 94%  Weight: (!) 302 lb (137 kg)  Height: '5\' 6"'  (1.676 m)   BMI Assessment: Estimated body mass index is 48.74 kg/m as calculated from the following:   Height as of this encounter: '5\' 6"'  (1.676 m).   Weight as of this encounter: 302 lb (137 kg).  BMI interpretation table: BMI level Category Range association with higher incidence of chronic pain  <18 kg/m2 Underweight   18.5-24.9 kg/m2 Ideal body weight   25-29.9 kg/m2 Overweight Increased incidence by 20%  30-34.9 kg/m2 Obese (Class I) Increased incidence by 68%  35-39.9 kg/m2 Severe obesity (Class II) Increased incidence by 136%  >40 kg/m2 Extreme obesity (Class III) Increased incidence by 254%   Patient's current BMI Ideal Body weight  Body mass index is 48.74 kg/m. Ideal body weight: 59.3 kg (130 lb 11.7 oz) Adjusted ideal body weight: 90.4 kg (199 lb 3.8 oz)   BMI Readings from Last 4 Encounters:  01/03/19 48.74 kg/m  10/19/18 47.78 kg/m  09/28/18 48.74 kg/m  09/26/18 48.92 kg/m   Wt Readings from Last 4 Encounters:  01/03/19 (!) 302 lb (137 kg)  10/19/18 296 lb (134.3 kg)  09/28/18 (!) 302 lb (137 kg)  09/26/18 (!) 303 lb 1.6 oz (137.5 kg)  Psych/Mental status: Alert, oriented x 3 (person, place, & time)       Eyes: PERLA Respiratory: No evidence of acute respiratory distress  Cervical Spine Area Exam  Skin & Axial Inspection: No masses, redness, edema, swelling, or associated skin lesions Alignment: Symmetrical Functional ROM: Unrestricted ROM      Stability: No instability detected Muscle Tone/Strength: Functionally intact. No obvious neuro-muscular anomalies detected. Sensory (Neurological): Unimpaired Palpation: No palpable anomalies              Upper Extremity (UE) Exam    Side: Right upper extremity  Side: Left upper extremity  Skin & Extremity Inspection: Skin color, temperature, and hair growth are WNL. No peripheral edema or cyanosis. No masses, redness,  swelling, asymmetry, or associated skin lesions. No contractures.  Skin & Extremity Inspection: Skin color, temperature, and hair growth are WNL. No peripheral edema or cyanosis. No masses, redness, swelling, asymmetry, or associated skin lesions. No contractures.  Functional ROM: Unrestricted ROM  Functional ROM: Unrestricted ROM          Muscle Tone/Strength: Functionally intact. No obvious neuro-muscular anomalies detected.  Muscle Tone/Strength: Functionally intact. No obvious neuro-muscular anomalies detected.  Sensory (Neurological): Unimpaired          Sensory (Neurological): Unimpaired          Palpation: No palpable anomalies              Palpation: No palpable anomalies              Provocative Test(s):  Phalen's test: deferred Tinel's test: deferred Apley's scratch test (touch opposite shoulder):  Action 1 (Across chest): deferred Action 2 (Overhead): deferred Action 3 (LB reach): deferred   Provocative Test(s):  Phalen's test: deferred Tinel's test: deferred Apley's scratch test (touch opposite shoulder):  Action 1 (Across chest): deferred Action 2 (Overhead): deferred Action 3 (LB reach): deferred    Thoracic Spine Area Exam  Skin & Axial Inspection: No masses, redness, or swelling Alignment: Symmetrical Functional ROM: Unrestricted ROM Stability: No instability detected Muscle Tone/Strength: Functionally intact. No obvious neuro-muscular anomalies detected. Sensory (Neurological): Unimpaired Muscle strength & Tone: No palpable anomalies Lumbar Spine Area Exam  Skin & Axial Inspection:Well healed scar from previous spine surgery detected Alignment:Symmetrical Functional GGE:ZMOQHUTML ROM Stability:No instability detected Muscle Tone/Strength:Functionally intact. No obvious neuro-muscular anomalies detected. Sensory (Neurological):Dermatomal pain pattern Palpation:Complains of area being tender to palpationBilateral Fist Percussion  Test Provocative Tests: Lumbar Hyperextension and rotation test:Positivebilaterally for facet joint pain.R>L Lumbar Lateral bending test:Positivedue to fusion restriction. Patrick's Maneuver:evaluation deferred today  Gait & Posture Assessment  Ambulation:Patient ambulates using a walker Gait:Limited. Using assistive device to ambulate Posture:Difficulty standing up straight, due to pain  Lower Extremity Exam    Side:Right lower extremity  Side:Left lower extremity  Skin & Extremity Inspection:Skin color, temperature, and hair growth are WNL. No peripheral edema or cyanosis. No masses, redness, swelling, asymmetry, or associated skin lesions. No contractures.  Skin & Extremity Inspection:Skin color, temperature, and hair growth are WNL. No peripheral edema or cyanosis. No masses, redness, swelling, asymmetry, or associated skin lesions. No contractures.  Functional YYT:KPTWSFKCL ROM  Functional EXN:TZGYFVCBS ROM  Muscle Tone/Strength:Functionally intact. No obvious neuro-muscular anomalies detected.  Muscle Tone/Strength:Functionally intact. No obvious neuro-muscular anomalies detected.  Sensory (Neurological):Paresthesia (Tingling sensation)  Sensory (Neurological):Paresthesia (Burning sensation)  Palpation:No palpable anomalies  Palpation:No palpable anomalies  4 out of 5 strength right plantarflexion, dorsiflexion, hip flexion, knee extension.    Assessment  Primary Diagnosis & Pertinent Problem List: The primary encounter diagnosis was Chronic pain syndrome. 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  Status Diagnosis  Persistent Persistent Persistent 1. Chronic pain syndrome   2. Failed back surgical syndrome   3. Spinal cord stimulator status (Nevro Paddle)   4. 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 L3-4 severe lumbar facet hypertrophy and spinal stenosis   6. Neurogenic pain   7. Fibromyalgia   8. L3-4 severe lumbar spinal stenosis (12/31/2016 MRI)   9. Spinal stenosis of thoracic region      Patient presents today for follow-up.  She is continuing to endorse severe axial low back pain with radiation down her left greater than right leg.  Attempts to prescribe buprenorphine have been denied by insurance but for belbuca and Butrans. It is frustrating that attempts to prescribe milder opioid analgesics that have better cardiorespiratory side effect profile have been denied by the insurance especially in the context of her morbid obesity and obstructive sleep apnea.  The patient states that she is in significant pain on most days and needs something stronger to help her function.  I had an extensive conversation about her current medications which include Xanax as well as Ambien.  Patient also has a history of morbid obesity as well as obstructive sleep apnea, compliant on CPAP therapy.  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 I explained the risks of concomitant benzodiazepine and opioid therapy when it comes to cardiovascular and respiratory risk associated with concomitant therapy.  I recommended the patient try and discontinue her Xanax and if she must take it to not take it within 60 to 90 minutes of taking Percocet.  I also recommended  the patient discontinue her Ambien as it is limiting her ability in reaching stage IV and REM sleep which could be contributing to not feeling well rested when she wakes up.  Patient is a previous pharmacy tech and endorsed understanding about timing of medications.  She states that she will try to take as little Xanax as possible for anxiety.  I discussed opioid tolerance as well as the risks of chronic opioid therapy with this patient.  At this point, I feel that we are so limited in options other than titration of opioid analgesics.  Patient is currently on gabapentin, lidocaine patches along with Voltaren gel.  Patient endorsed understanding.  All questions and concerns were answered.  Prescription provided for 2 months below.  Plan of Care  Pharmacotherapy (Medications Ordered): Meds ordered this encounter  Medications  . oxyCODONE-acetaminophen (PERCOCET) 10-325 MG tablet    Sig: Take 1 tablet by mouth 2 (two) times daily as needed for pain.    Dispense:  60 tablet    Refill:  0    Do not place this medication, or any other prescription from our practice, on "Automatic Refill". Patient may have prescription filled one day early if pharmacy is closed on scheduled refill date.  Marland Kitchen oxyCODONE-acetaminophen (PERCOCET) 10-325 MG tablet    Sig: Take 1 tablet by mouth 2 (two) times daily as needed for pain.    Dispense:  60 tablet    Refill:  0  . diclofenac sodium (VOLTAREN) 1 % GEL    Sig: Apply 4 g topically 4 (four) times daily.    Dispense:  100 g    Refill:  2    Do not add this medication to the electronic "Automatic Refill" notification system. Patient may have prescription filled one day early if pharmacy is closed on scheduled refill date.   Lab-work, procedure(s), and/or referral(s): Orders Placed This Encounter  Procedures  . ToxASSURE Select 13 (MW), Urine   Provider-requested follow-up: Return in about 10 weeks (around 03/14/2019) for Medication Management.  Future Appointments   Date Time Provider [place]  03/14/2019  1:30 PM [name], MD [facility] None  04/03/2019 12:45 PM [facility] None  04/03/2019  1:00 PM Finnegan, Kathlene November, MD [facility] None  04/03/2019  1:30 PM [facility]FUSION CHAIR 1 [facility] None  09/06/2019  2:15 PM Schnier, Dolores Lory, MD [facility] None    Primary Care Physician: Rusty Aus, MD Location: [facility] Note by: [name], M.D Date: 01/03/2019; Time: 3:08 PM  Patient Instructions  Medication Rules  Applies to: All patients receiving prescriptions (written or electronic).  Pharmacy of record: Pharmacy where electronic prescriptions will be sent. If written prescriptions are taken to a different pharmacy, please inform the nursing staff. The pharmacy listed in the electronic medical record should be the one where you would like electronic prescriptions to be sent.  Prescription refills: Only during scheduled appointments. Applies to both, written and electronic prescriptions.  NOTE: The following applies primarily to controlled substances (Opioid* Pain Medications).   Patient's responsibilities: 1. Pain Pills: Bring all pain pills to every appointment (except for procedure appointments). 2. Pill Bottles: Bring pills in original pharmacy bottle. Always bring newest bottle. Bring bottle, even if empty. 3. Medication refills: You are responsible for knowing and keeping track of what medications you need refilled. The day before your appointment, write a list of all prescriptions that need to be refilled. Bring that list to your appointment and give it to the admitting nurse. Prescriptions will be written only during appointments. If you forget a medication, it will not be "Called in", "Faxed", or "electronically sent". You will need to get another appointment to get these prescribed. 4. Prescription Accuracy: You are responsible for carefully inspecting your prescriptions before  leaving our office. Have the discharge nurse carefully go over each prescription with you, before taking them home. Make sure that your name is accurately spelled, that your address is correct. Check the name and dose of your medication to make sure it is accurate. Check the number of pills, and the written instructions to make sure they are clear and accurate. Make sure that you are given enough medication to last until your next medication refill appointment. 5. Taking Medication: Take medication as prescribed. Never take more pills than instructed. Never take medication more frequently than prescribed. Taking less pills or less frequently is permitted and encouraged, when it comes to controlled substances (written prescriptions).  6. Inform other Doctors: Always inform, all of your healthcare providers, of all the medications you take. 7. Pain Medication from other Providers: You are not allowed to accept any additional pain medication from any other Doctor or Healthcare provider. There are two exceptions to this rule. (see below) In the event that you require additional pain medication, you are responsible for notifying us, as stated below. 8. Medication Agreement: You are responsible for carefully reading and following our Medication Agreement. This must be signed before receiving any prescriptions from our practice. Safely store a copy of your signed Agreement. Violations to the Agreement will result in no further prescriptions. (Additional copies of our Medication Agreement are available upon request.) 9. Laws, Rules, & Regulations: All patients are expected to follow all Federal and [employer], [employer], Rules, [employer]. Ignorance of the Laws does not constitute a valid excuse. The use of any illegal substances is prohibited. 10. Adopted CDC guidelines & recommendations: Target dosing levels will be at or below 60 MME/day. Use of benzodiazepines** is not recommended.  Exceptions: There are only  two exceptions to the rule of not receiving pain medications from other Healthcare Providers. 1. Exception #1 (Emergencies): In the event of an emergency (i.e.: accident requiring emergency care), you are allowed to  receive additional pain medication. However, you are responsible for: As soon as you are able, call our office [phone], at any time of the day or night, and leave a message stating your name, the date and nature of the emergency, and the name and dose of the medication prescribed. In the event that your call is answered by a member of our staff, make sure to document and save the date, time, and the name of the person that took your information.  2. Exception #2 (Planned Surgery): In the event that you are scheduled by another doctor or dentist to have any type of surgery or procedure, you are allowed (for a period no longer than 30 days), to receive additional pain medication, for the acute post-op pain. However, in this case, you are responsible for picking up a copy of our "Post-op Pain Management for Surgeons" handout, and giving it to your surgeon or dentist. This document is available at our office, and does not require an appointment to obtain it. Simply go to our office during business hours (Monday-Thursday from 8:00 AM to 4:00 PM) (Friday 8:00 AM to 12:00 Noon) or if you have a scheduled appointment with [place], prior to your surgery, and ask for it by name. In addition, you will need to provide [place] with your name, name of your surgeon, type of surgery, and date of procedure or surgery.  *Opioid medications include: morphine, codeine, oxycodone, oxymorphone, hydrocodone, hydromorphone, meperidine, tramadol, tapentadol, buprenorphine, fentanyl, methadone. **Benzodiazepine medications include: diazepam (Valium), alprazolam (Xanax), clonazepam (Klonopine), lorazepam (Ativan), clorazepate (Tranxene), chlordiazepoxide (Librium), estazolam (Prosom), oxazepam (Serax), temazepam (Restoril),  triazolam (Halcion) (Last updated: 02/23/2018)

## 2019-01-08 LAB — TOXASSURE SELECT 13 (MW), URINE

## 2019-01-24 DIAGNOSIS — G4733 Obstructive sleep apnea (adult) (pediatric): Secondary | ICD-10-CM

## 2019-02-17 ENCOUNTER — Other Ambulatory Visit: Payer: Self-pay | Admitting: Pulmonary Disease

## 2019-02-20 NOTE — Telephone Encounter (Signed)
I probably prescribed this medication as an alternative to an ACE inhibitor in the setting of chronic cough. I have not seen her in over a year. Therefore, this medication should be reviewed and refilled by her primary MD  Thanks  Waunita Schooner

## 2019-02-22 ENCOUNTER — Telehealth: Payer: Self-pay | Admitting: Pulmonary Disease

## 2019-02-22 MED ORDER — CANDESARTAN CILEXETIL 32 MG PO TABS: 32.0000 mg | ORAL_TABLET | Freq: Every day | ORAL | 0 refills | Status: DC

## 2019-02-22 NOTE — Telephone Encounter (Signed)
Contacted patient and reminded that she was suppose to f/u on bp with pcp. Will send 30 days supply. Pt verbalized understanding.

## 2019-03-14 ENCOUNTER — Ambulatory Visit
Payer: Medicare Other | Attending: Student in an Organized Health Care Education/Training Program | Admitting: Student in an Organized Health Care Education/Training Program

## 2019-03-14 ENCOUNTER — Other Ambulatory Visit: Payer: Self-pay

## 2019-03-14 ENCOUNTER — Encounter: Payer: Self-pay | Admitting: Student in an Organized Health Care Education/Training Program

## 2019-03-14 VITALS — BP 176/67 | HR 71 | Temp 98.6°F | Resp 18 | Ht 66.0 in | Wt 299.0 lb

## 2019-03-14 DIAGNOSIS — M792 Neuralgia and neuritis, unspecified: Secondary | ICD-10-CM

## 2019-03-14 DIAGNOSIS — M797 Fibromyalgia: Secondary | ICD-10-CM

## 2019-03-14 DIAGNOSIS — G894 Chronic pain syndrome: Secondary | ICD-10-CM | POA: Diagnosis present

## 2019-03-14 DIAGNOSIS — Z9689 Presence of other specified functional implants: Secondary | ICD-10-CM | POA: Insufficient documentation

## 2019-03-14 DIAGNOSIS — M7918 Myalgia, other site: Secondary | ICD-10-CM | POA: Diagnosis not present

## 2019-03-14 DIAGNOSIS — M47816 Spondylosis without myelopathy or radiculopathy, lumbar region: Secondary | ICD-10-CM | POA: Diagnosis present

## 2019-03-14 DIAGNOSIS — Z9889 Other specified postprocedural states: Secondary | ICD-10-CM

## 2019-03-14 DIAGNOSIS — M961 Postlaminectomy syndrome, not elsewhere classified: Secondary | ICD-10-CM

## 2019-03-14 MED ORDER — OXYCODONE-ACETAMINOPHEN 10-325 MG PO TABS: 1.0000 | ORAL_TABLET | Freq: Two times a day (BID) | ORAL | 0 refills | Status: AC | PRN

## 2019-03-14 NOTE — Progress Notes (Signed)
Nursing Pain Medication Assessment:  Safety precautions to be maintained throughout the outpatient stay will include: orient to surroundings, keep bed in low position, maintain call bell within reach at all times, provide assistance with transfer out of bed and ambulation.  Medication Inspection Compliance: Pill count conducted under aseptic conditions, in front of the patient. Neither the pills nor the bottle was removed from the patient's sight at any time. Once count was completed pills were immediately returned to the patient in their original bottle.  Medication: Oxycodone/APAP Pill/Patch Count: 32 of 60 pills remain Pill/Patch Appearance: Markings consistent with prescribed medication Bottle Appearance: Standard pharmacy container. Clearly labeled. Filled Date: 02 / 07 / 2020 Last Medication intake:  Today

## 2019-03-14 NOTE — Patient Instructions (Signed)
GENERAL RISKS AND COMPLICATIONS  What are the risk, side effects and possible complications? Generally speaking, most procedures are safe.  However, with any procedure there are risks, side effects, and the possibility of complications.  The risks and complications are dependent upon the sites that are lesioned, or the type of nerve block to be performed.  The closer the procedure is to the spine, the more serious the risks are.  Great care is taken when placing the radio frequency needles, block needles or lesioning probes, but sometimes complications can occur. 1. Infection: Any time there is an injection through the skin, there is a risk of infection.  This is why sterile conditions are used for these blocks.  There are four possible types of infection. 1. Localized skin infection. 2. Central Nervous System Infection-This can be in the form of Meningitis, which can be deadly. 3. Epidural Infections-This can be in the form of an epidural abscess, which can cause pressure inside of the spine, causing compression of the spinal cord with subsequent paralysis. This would require an emergency surgery to decompress, and there are no guarantees that the patient would recover from the paralysis. 4. Discitis-This is an infection of the intervertebral discs.  It occurs in about 1% of discography procedures.  It is difficult to treat and it may lead to surgery.        2. Pain: the needles have to go through skin and soft tissues, will cause soreness.       3. Damage to internal structures:  The nerves to be lesioned may be near blood vessels or    other nerves which can be potentially damaged.       4. Bleeding: Bleeding is more common if the patient is taking blood thinners such as  aspirin, Coumadin, Ticiid, Plavix, etc., or if he/she have some genetic predisposition  such as hemophilia. Bleeding into the spinal canal can cause compression of the spinal  cord with subsequent paralysis.  This would require an  emergency surgery to  decompress and there are no guarantees that the patient would recover from the  paralysis.       5. Pneumothorax:  Puncturing of a lung is a possibility, every time a needle is introduced in  the area of the chest or upper back.  Pneumothorax refers to free air around the  collapsed lung(s), inside of the thoracic cavity (chest cavity).  Another two possible  complications related to a similar event would include: Hemothorax and Chylothorax.   These are variations of the Pneumothorax, where instead of air around the collapsed  lung(s), you may have blood or chyle, respectively.       6. Spinal headaches: They may occur with any procedures in the area of the spine.       7. Persistent CSF (Cerebro-Spinal Fluid) leakage: This is a rare problem, but may occur  with prolonged intrathecal or epidural catheters either due to the formation of a fistulous  track or a dural tear.       8. Nerve damage: By working so close to the spinal cord, there is always a possibility of  nerve damage, which could be as serious as a permanent spinal cord injury with  paralysis.       9. Death:  Although rare, severe deadly allergic reactions known as "Anaphylactic  reaction" can occur to any of the medications used.      10. Worsening of the symptoms:  We can always make thing worse.    What are the chances of something like this happening? Chances of any of this occuring are extremely low.  By statistics, you have more of a chance of getting killed in a motor vehicle accident: while driving to the hospital than any of the above occurring .  Nevertheless, you should be aware that they are possibilities.  In general, it is similar to taking a shower.  Everybody knows that you can slip, hit your head and get killed.  Does that mean that you should not shower again?  Nevertheless always keep in mind that statistics do not mean anything if you happen to be on the wrong side of them.  Even if a procedure has a 1  (one) in a 1,000,000 (million) chance of going wrong, it you happen to be that one..Also, keep in mind that by statistics, you have more of a chance of having something go wrong when taking medications.  Who should not have this procedure? If you are on a blood thinning medication (e.g. Coumadin, Plavix, see list of "Blood Thinners"), or if you have an active infection going on, you should not have the procedure.  If you are taking any blood thinners, please inform your physician.  How should I prepare for this procedure?  Do not eat or drink anything at least six hours prior to the procedure.  Bring a driver with you .  It cannot be a taxi.  Come accompanied by an adult that can drive you back, and that is strong enough to help you if your legs get weak or numb from the local anesthetic.  Take all of your medicines the morning of the procedure with just enough water to swallow them.  If you have diabetes, make sure that you are scheduled to have your procedure done first thing in the morning, whenever possible.  If you have diabetes, take only half of your insulin dose and notify our nurse that you have done so as soon as you arrive at the clinic.  If you are diabetic, but only take blood sugar pills (oral hypoglycemic), then do not take them on the morning of your procedure.  You may take them after you have had the procedure.  Do not take aspirin or any aspirin-containing medications, at least eleven (11) days prior to the procedure.  They may prolong bleeding.  Wear loose fitting clothing that may be easy to take off and that you would not mind if it got stained with Betadine or blood.  Do not wear any jewelry or perfume  Remove any nail coloring.  It will interfere with some of our monitoring equipment.  NOTE: Remember that this is not meant to be interpreted as a complete list of all possible complications.  Unforeseen problems may occur.  BLOOD THINNERS The following drugs  contain aspirin or other products, which can cause increased bleeding during surgery and should not be taken for 2 weeks prior to and 1 week after surgery.  If you should need take something for relief of minor pain, you may take acetaminophen which is found in Tylenol,m Datril, Anacin-3 and Panadol. It is not blood thinner. The products listed below are.  Do not take any of the products listed below in addition to any listed on your instruction sheet.  A.P.C or A.P.C with Codeine Codeine Phosphate Capsules #3 Ibuprofen Ridaura  ABC compound Congesprin Imuran rimadil  Advil Cope Indocin Robaxisal  Alka-Seltzer Effervescent Pain Reliever and Antacid Coricidin or Coricidin-D  Indomethacin Rufen    Alka-Seltzer plus Cold Medicine Cosprin Ketoprofen S-A-C Tablets  Anacin Analgesic Tablets or Capsules Coumadin Korlgesic Salflex  Anacin Extra Strength Analgesic tablets or capsules CP-2 Tablets Lanoril Salicylate  Anaprox Cuprimine Capsules Levenox Salocol  Anexsia-D Dalteparin Magan Salsalate  Anodynos Darvon compound Magnesium Salicylate Sine-off  Ansaid Dasin Capsules Magsal Sodium Salicylate  Anturane Depen Capsules Marnal Soma  APF Arthritis pain formula Dewitt's Pills Measurin Stanback  Argesic Dia-Gesic Meclofenamic Sulfinpyrazone  Arthritis Bayer Timed Release Aspirin Diclofenac Meclomen Sulindac  Arthritis pain formula Anacin Dicumarol Medipren Supac  Analgesic (Safety coated) Arthralgen Diffunasal Mefanamic Suprofen  Arthritis Strength Bufferin Dihydrocodeine Mepro Compound Suprol  Arthropan liquid Dopirydamole Methcarbomol with Aspirin Synalgos  ASA tablets/Enseals Disalcid Micrainin Tagament  Ascriptin Doan's Midol Talwin  Ascriptin A/D Dolene Mobidin Tanderil  Ascriptin Extra Strength Dolobid Moblgesic Ticlid  Ascriptin with Codeine Doloprin or Doloprin with Codeine Momentum Tolectin  Asperbuf Duoprin Mono-gesic Trendar  Aspergum Duradyne Motrin or Motrin IB Triminicin  Aspirin  plain, buffered or enteric coated Durasal Myochrisine Trigesic  Aspirin Suppositories Easprin Nalfon Trillsate  Aspirin with Codeine Ecotrin Regular or Extra Strength Naprosyn Uracel  Atromid-S Efficin Naproxen Ursinus  Auranofin Capsules Elmiron Neocylate Vanquish  Axotal Emagrin Norgesic Verin  Azathioprine Empirin or Empirin with Codeine Normiflo Vitamin E  Azolid Emprazil Nuprin Voltaren  Bayer Aspirin plain, buffered or children's or timed BC Tablets or powders Encaprin Orgaran Warfarin Sodium  Buff-a-Comp Enoxaparin Orudis Zorpin  Buff-a-Comp with Codeine Equegesic Os-Cal-Gesic   Buffaprin Excedrin plain, buffered or Extra Strength Oxalid   Bufferin Arthritis Strength Feldene Oxphenbutazone   Bufferin plain or Extra Strength Feldene Capsules Oxycodone with Aspirin   Bufferin with Codeine Fenoprofen Fenoprofen Pabalate or Pabalate-SF   Buffets II Flogesic Panagesic   Buffinol plain or Extra Strength Florinal or Florinal with Codeine Panwarfarin   Buf-Tabs Flurbiprofen Penicillamine   Butalbital Compound Four-way cold tablets Penicillin   Butazolidin Fragmin Pepto-Bismol   Carbenicillin Geminisyn Percodan   Carna Arthritis Reliever Geopen Persantine   Carprofen Gold's salt Persistin   Chloramphenicol Goody's Phenylbutazone   Chloromycetin Haltrain Piroxlcam   Clmetidine heparin Plaquenil   Cllnoril Hyco-pap Ponstel   Clofibrate Hydroxy chloroquine Propoxyphen         Before stopping any of these medications, be sure to consult the physician who ordered them.  Some, such as Coumadin (Warfarin) are ordered to prevent or treat serious conditions such as "deep thrombosis", "pumonary embolisms", and other heart problems.  The amount of time that you may need off of the medication may also vary with the medication and the reason for which you were taking it.  If you are taking any of these medications, please make sure you notify your pain physician before you undergo any procedures.          Trigger Point Injection Trigger points are areas where you have pain. A trigger point injection is a shot given in the trigger point to help relieve pain for a few days to a few months. Common places for trigger points include:  The neck.  The shoulders.  The upper back.  The lower back. A trigger point injection will not cure long-lasting (chronic) pain permanently. These injections do not always work for every person, but for some people they can help to relieve pain for a few days to a few months. Tell a health care provider about:  Any allergies you have.  All medicines you are taking, including vitamins, herbs, eye drops, creams, and over-the-counter medicines.  Any problems you or  family members have had with anesthetic medicines.  Any blood disorders you have.  Any surgeries you have had.  Any medical conditions you have. What are the risks? Generally, this is a safe procedure. However, problems may occur, including:  Infection.  Bleeding.  Allergic reaction to the injected medicine.  Irritation of the skin around the injection site. What happens before the procedure?  Ask your health care provider about changing or stopping your regular medicines. This is especially important if you are taking diabetes medicines or blood thinners. What happens during the procedure?  Your health care provider will feel for trigger points. A marker may be used to circle the area for the injection.  The skin over the trigger point will be washed with a germ-killing (antiseptic) solution.  A thin needle is used for the shot. You may feel pain or a twitching feeling when the needle enters the trigger point.  A numbing solution may be injected into the trigger point. Sometimes a medicine to keep down swelling, redness, and warmth (inflammation) is also injected.  Your health care provider may move the needle around the area where the trigger point is located until the  tightness and twitching goes away.  After the injection, your health care provider may put gentle pressure over the injection site.  The injection site will be covered with a bandage (dressing). The procedure may vary among health care providers and hospitals. What happens after the procedure?  The dressing can be taken off in a few hours or as told by your health care provider.  You may feel sore and stiff for 1-2 days. This information is not intended to replace advice given to you by your health care provider. Make sure you discuss any questions you have with your health care provider. Document Released: 12/02/2011 Document Revised: 08/15/2016 Document Reviewed: 06/02/2015 [employer] Interactive Patient Education  2019 [employer].

## 2019-03-15 NOTE — Progress Notes (Signed)
Patient's Name: Brittney [name]  MRN: [id number]  Referring Provider: Rusty Aus, MD  DOB: [birth year]  PCP: Rusty Aus, MD  DOS: 03/14/2019  Note by: [name], MD  Service setting: Ambulatory outpatient  Specialty: Interventional Pain Management  Location: [facility] (AMB) Pain Management Facility    Patient type: Established   Primary Reason(s) for Visit: Encounter for prescription drug management. (Level of risk: moderate)  CC: Medication Refill; Back Pain; and Leg Pain  HPI  Ms. Luther is a 68 y.o. year old, female patient, who comes today for a medication management evaluation. She has Abnormal tumor markers; Chronic kidney disease, stage II (mild); Degenerative spondylolisthesis; Diabetes mellitus with peripheral vascular disease ([place]); Diabetes with retinopathy ([place]); Diabetic nephropathy ([place]); DM (diabetes mellitus) type II uncontrolled, periph vascular disorder ([place]); Fibromyalgia; Gastroparesis due to DM ([place]); Generalized osteoarthritis of multiple sites; Hepatic cirrhosis ([place]); Hyperlipemia; Lung nodule, multiple; Microalbuminuria; OSA (obstructive sleep apnea); Seronegative arthritis; Essential hypertension; L3-4 severe lumbar spinal stenosis (12/31/2016 MRI); Type 2 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, with long-term current use of insulin ([place]); Unilateral small kidney; Long term current use of opiate analgesic; Long term prescription opiate use; Opiate use; Chronic pain syndrome; Morbid obesity with BMI of 40.0-44.9, adult ([place]); Type 2 diabetes mellitus with diabetic nephropathy, with long-term current use of insulin ([place]); Chronic low back pain (Location of Primary Source of Pain) (Bilateral) (R>L); 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); Chronic lower extremity pain (Location of Secondary source of pain) (Bilateral) (R>L); Chronic knee pain (Location of Tertiary source of pain) (Right); Osteoarthritis of knee  (Right); Grade 1 Anterolisthesis of L3 over L4 and L4 over L5; Osteoarthritis of sacroiliac joint (Right); L3-4 severe lumbar facet hypertrophy and spinal stenosis; Neurogenic pain; Long term prescription benzodiazepine use; Vitamin D insufficiency; Iron deficiency anemia; Back pain; Status post lumbar spine surgery for decompression of spinal cord; Myofascial pain; Spinal stenosis of thoracic region; Lymphedema; and Chronic venous insufficiency on their problem list. Her primarily concern today is the Medication Refill; Back Pain; and Leg Pain  Pain Assessment: Location: Lower Back Radiating: Pain radiates from lower back into lower legs  Onset: More than a month ago Duration: Chronic pain Quality: Constant("Lighting bolts" ) Severity: 7 /10 (subjective, self-reported pain score)  Note: Reported level is compatible with observation.                         When using our objective Pain Scale, levels between 6 and 10/10 are said to belong in an emergency room, as it progressively worsens from a 6/10, described as severely limiting, requiring emergency care not usually available at an outpatient pain management facility. At a 6/10 level, communication becomes difficult and requires great effort. Assistance to reach the emergency department may be required. Facial flushing and profuse sweating along with potentially dangerous increases in heart rate and blood pressure will be evident. Effect on ADL: "Unable to get out from the house and no longer do grocery shopping "  Timing: Constant Modifying factors: Denies  BP: (!) 176/67   HR: 71  Ms. Shipp was last scheduled for an appointment on 01/03/2019 for medication management. During today's appointment we reviewed Ms. Martell's chronic pain status, as well as her outpatient medication regimen.  Ms. Quiggle follows up today for medication management.  She endorses difficulty over the last month in regards to ambulating and performing ADLs.  She describes  increased pain  in her low back along with weakness with ambulation.  This is been chronic in nature for her but is gotten worse.  Patient has multiple medical comorbidities in addition to her chronic lumbar radicular and stenosis issues.  The patient  reports no history of drug use. Her body mass index is 48.26 kg/m.  Further details on both, my assessment(s), as well as the proposed treatment plan, please see below.  Controlled Substance Pharmacotherapy Assessment REMS (Risk Evaluation and Mitigation Strategy)  Analgesic:  02/02/2019  1   01/03/2019  Oxycodone-Acetaminophen 10-325  60.00 30 Bi Lat   [id number]   Nor (1409)   0  30.00 MME  Medicare   [place]   Tramadol 100 mg 3 times daily as needed, managed by PCP. MME/day: 60 mg/day.  Janne Napoleon, RN  03/14/2019  1:59 PM  Sign when Signing Visit Nursing Pain Medication Assessment:  Safety precautions to be maintained throughout the outpatient stay will include: orient to surroundings, keep bed in low position, maintain call bell within reach at all times, provide assistance with transfer out of bed and ambulation.  Medication Inspection Compliance: Pill count conducted under aseptic conditions, in front of the patient. Neither the pills nor the bottle was removed from the patient's sight at any time. Once count was completed pills were immediately returned to the patient in their original bottle.  Medication: Oxycodone/APAP Pill/Patch Count: 32 of 60 pills remain Pill/Patch Appearance: Markings consistent with prescribed medication Bottle Appearance: Standard pharmacy container. Clearly labeled. Filled Date: 02 / 07 / 2020 Last Medication intake:  Today   Pharmacokinetics: Liberation and absorption (onset of action): WNL Distribution (time to peak effect): WNL Metabolism and excretion (duration of action): WNL         Pharmacodynamics: Desired effects: Analgesia: Ms. Burback reports >50% benefit. Functional ability: Patient reports  that medication allows her to accomplish basic ADLs Clinically meaningful improvement in function (CMIF): Sustained CMIF goals met Perceived effectiveness: Described as relatively effective, allowing for increase in activities of daily living (ADL) Undesirable effects: Side-effects or Adverse reactions: None reported Monitoring: [place] PMP: Online review of the past 78-monthperiod conducted. Compliant with practice rules and regulations Last UDS on record: Summary  Date Value Ref Range Status  01/03/2019 FINAL  Final    Comment:    ==================================================================== TOXASSURE SELECT 13 (MW) ==================================================================== Test                             Result       Flag       Units Drug Present and Declared for Prescription Verification   Alprazolam                     165          EXPECTED   ng/mg creat   Alpha-hydroxyalprazolam        412          EXPECTED   ng/mg creat    Source of alprazolam is a scheduled prescription medication.    Alpha-hydroxyalprazolam is an expected metabolite of alprazolam.   Oxycodone                      812          EXPECTED   ng/mg creat   Oxymorphone                    932  EXPECTED   ng/mg creat   Noroxycodone                   1294         EXPECTED   ng/mg creat   Noroxymorphone                 332          EXPECTED   ng/mg creat    Sources of oxycodone are scheduled prescription medications.    Oxymorphone, noroxycodone, and noroxymorphone are expected    metabolites of oxycodone. Oxymorphone is also available as a    scheduled prescription medication.   Tramadol                       2565         EXPECTED   ng/mg creat   O-Desmethyltramadol            7935         EXPECTED   ng/mg creat   N-Desmethyltramadol            2141         EXPECTED   ng/mg creat    Source of tramadol is a prescription medication.    O-desmethyltramadol and N-desmethyltramadol are expected     metabolites of tramadol.   Butalbital                     PRESENT      EXPECTED ==================================================================== Test                      Result    Flag   Units      Ref Range   Creatinine              34               mg/dL      >=20 ==================================================================== Declared Medications:  The flagging and interpretation on this report are based on the  following declared medications.  Unexpected results may arise from  inaccuracies in the declared medications.  **Note: The testing scope of this panel includes these medications:  Alprazolam  Butalbital (Butalbital/APAP/Caffeine)  Oxycodone (Percocet)  Tramadol (Ultram)  **Note: The testing scope of this panel does not include following  reported medications:  Acetaminophen (Butalbital/APAP/Caffeine)  Acetaminophen (Percocet)  Albuterol  Bumetanide  Caffeine (Butalbital/APAP/Caffeine)  Candesartan  Carvedilol  Clonidine  Cyclobenzaprine (Flexeril)  Doxazosin (Cardura)  Escitalopram (Lexapro)  Fluticasone (Flonase)  Fluticasone (Flovent)  Gabapentin (Neurontin)  Hydralazine (Apresoline)  Ibuprofen (Advil)  Insulin (Humulin)  Insulin (Levemir)  Lactulose  Levothyroxine  Lubiprostone (Amitiza)  Metoclopramide (Reglan)  Pantoprazole (Protonix)  Potassium  Pramipexole (Mirapex)  Sucralfate (Carafate)  Topical Diclofenac (Voltaren)  Trazodone (Desyrel)  Zolpidem (Ambien) ==================================================================== For clinical consultation, please call [phone]. ====================================================================    UDS interpretation: Compliant          Medication Assessment Form: Reviewed. Patient indicates being compliant with therapy Treatment compliance: Compliant Risk Assessment Profile: Aberrant behavior: See initial evaluations. None observed or detected today Comorbid factors increasing  risk of overdose: Benzodiazepine use, caucasian, concomitant use of Benzodiazepines and sleep apnea Opioid risk tool (ORT):  Opioid Risk  01/03/2019  Alcohol 0  Illegal Drugs 0  Rx Drugs 0  Alcohol 0  Illegal Drugs 0  Rx Drugs 0  Age between 16-45 years  0  History of Preadolescent Sexual Abuse 0  Psychological Disease  0  ADD -  OCD -  Bipolar -  Depression 0  Opioid Risk Tool Scoring 0  Opioid Risk Interpretation Low Risk    ORT Scoring interpretation table:  Score <3 = Low Risk for SUD  Score between 4-7 = Moderate Risk for SUD  Score >8 = High Risk for Opioid Abuse   Risk of substance use disorder (SUD): Low  Risk Mitigation Strategies:  Patient Counseling: Covered Patient-Prescriber Agreement (PPA): Present and active  Notification to other healthcare providers: Done  Pharmacologic Plan: No change in therapy, at this time.             Laboratory Chemistry  Inflammation Markers (CRP: Acute Phase) (ESR: Chronic Phase) Lab Results  Component Value Date   CRP 1.2 (H) 12/07/2016   ESRSEDRATE 56 (H) 12/07/2016                         Rheumatology Markers No results found for: RF, ANA, LABURIC, URICUR, LYMEIGGIGMAB, LYMEABIGMQN, HLAB27                      Renal Function Markers Lab Results  Component Value Date   BUN 14 10/28/2017   CREATININE 0.60 10/28/2017   GFRAA >60 10/28/2017   GFRNONAA >60 10/28/2017                             Hepatic Function Markers Lab Results  Component Value Date   AST 29 10/28/2017   ALT 24 10/28/2017   ALBUMIN 3.4 (L) 10/28/2017   ALKPHOS 103 10/28/2017   LIPASE 114 10/22/2014                        Electrolytes Lab Results  Component Value Date   NA 137 10/28/2017   K 3.8 10/28/2017   CL 101 10/28/2017   CALCIUM 9.5 10/28/2017   MG 1.8 12/07/2016                        Neuropathy Markers Lab Results  Component Value Date   DJSHFWYO37 858 12/07/2016   HGBA1C 9.3 (H) 10/23/2014                        CNS  Tests No results found for: COLORCSF, APPEARCSF, RBCCOUNTCSF, WBCCSF, POLYSCSF, LYMPHSCSF, EOSCSF, PROTEINCSF, GLUCCSF, JCVIRUS, CSFOLI, IGGCSF                      Bone Pathology Markers Lab Results  Component Value Date   25OHVITD1 26 (L) 12/07/2016   25OHVITD2 1.2 12/07/2016   25OHVITD3 25 12/07/2016                         Coagulation Parameters Lab Results  Component Value Date   INR 0.95 05/23/2018   LABPROT 12.6 05/23/2018   APTT 34.9 10/22/2014   PLT 177 09/26/2018                        Cardiovascular Markers Lab Results  Component Value Date   CKTOTAL 161 10/23/2014   CKMB 1.0 10/23/2014   TROPONINI 0.06 (H) 10/23/2014   HGB 11.4 (L) 09/26/2018   HCT 34.8 (L) 09/26/2018  CA Markers No results found for: CEA, CA125, LABCA2                      Endocrine Markers Lab Results  Component Value Date   TSH 2.80 10/23/2014                        Note: Lab results reviewed.  Recent Diagnostic Imaging Results  US ABDOMEN RUQ W/ELASTOGRAPHY CLINICAL DATA:  Hepatic cirrhosis.  EXAM: US ABDOMEN LIMITED - RIGHT UPPER QUADRANT  ULTRASOUND HEPATIC ELASTOGRAPHY  TECHNIQUE: Limited right upper quadrant abdominal ultrasound was performed. In addition, ultrasound elastography evaluation of the liver was performed. A region of interest was placed in the right lobe of the liver. Following application of a compressive sonographic pulse, shear waves were detected in the adjacent hepatic tissue and the shear wave velocity was calculated. Multiple assessments were performed at the selected site. Median shear wave velocity is correlated to a Metavir fibrosis score.  COMPARISON:  Ultrasound on 10/13/2018  FINDINGS: ULTRASOUND ABDOMEN LIMITED RIGHT UPPER QUADRANT  Gallbladder:  Surgically absent.  Common bile duct:  Diameter: 10 mm, within normal limits status post cholecystectomy.  Liver:  Limited visualization due to diffusely increased  echogenicity and poor acoustic penetration. No definite liver mass identified. Diffusely increased echogenicity is consistent with hepatic cirrhosis. Portal vein is patent on color Doppler imaging with normal direction of blood flow towards the liver.  ULTRASOUND HEPATIC ELASTOGRAPHY  Device: Siemens Helix VTQ  Patient position: Supine  Transducer DAX  Number of measurements: 10  Hepatic segment:  8  Median velocity:   1.44 m/sec  IQR: 0.25  IQR/Median velocity ratio: 0.17  Corresponding Metavir fibrosis score:  F2 + some F3  Risk of fibrosis: Moderate  Limitations of exam: Morbid obesity  Please note that abnormal shear wave velocities may also be identified in clinical settings other than with hepatic fibrosis, such as: acute hepatitis, elevated right heart and central venous pressures including use of beta blockers, veno-occlusive disease (Budd-Chiari), infiltrative processes such as mastocytosis/amyloidosis/infiltrative tumor, extrahepatic cholestasis, in the post-prandial state, and liver transplantation. Correlation with patient history, laboratory data, and clinical condition recommended.  IMPRESSION: ULTRASOUND ABDOMEN:  Diffuse hepatic steatosis. Limited visualization of liver, however no mass identified.  ULTRASOUND HEPATIC ELASTOGRAHY:  Median hepatic shear wave velocity is calculated at 1.44 m/sec.  Corresponding Metavir fibrosis score is F2 + some F3.  Risk of fibrosis is Moderate.  Follow-up: Additional testing appropriate  Electronically Signed   By: Earle Gell M.D.   On: 11/07/2018 10:16  Complexity Note: Imaging results reviewed. Results shared with Ms. Aoun, using Layman's terms.                               Meds   Current Outpatient Medications:    albuterol (VENTOLIN HFA) 108 (90 Base) MCG/ACT inhaler, TAKE 2 PUFFS BY MOUTH 3 TIMES A DAY AS NEEDED FOR SHORTNESS OF BREATH, Disp: , Rfl:    ALPRAZolam (XANAX) 0.5 MG tablet,  Take 0.5 mg by mouth 4 (four) times daily. , Disp: , Rfl:    bumetanide (BUMEX) 1 MG tablet, Take by mouth daily. , Disp: , Rfl:    butalbital-acetaminophen-caffeine (FIORICET, ESGIC) 50-325-40 MG tablet, TAKE ONE TABLET BY MOUTH ONCE DAILY, Disp: , Rfl:    candesartan (ATACAND) 32 MG tablet, Take 1 tablet (32 mg total) by mouth daily., Disp: 30 tablet,  Rfl: 0   carvedilol (COREG) 25 MG tablet, Take 37.5-50 mg by mouth 2 (two) times daily with a meal. TAKES 2 TABLETS IN THE MORNING AND 1.5 TABLETS IN THE EVENING, Disp: , Rfl:    cloNIDine (CATAPRES - DOSED IN MG/24 HR) 0.3 mg/24hr patch, Place 0.3 mg onto the skin once a week. , Disp: , Rfl:    cyclobenzaprine (FLEXERIL) 10 MG tablet, Take 20 mg by mouth at bedtime., Disp: , Rfl:    diclofenac sodium (VOLTAREN) 1 % GEL, Apply 4 g topically as needed (PAIN). , Disp: , Rfl:    diclofenac sodium (VOLTAREN) 1 % GEL, Apply 4 g topically 4 (four) times daily., Disp: 100 g, Rfl: 2   doxazosin (CARDURA) 4 MG tablet, Take 4 mg by mouth at bedtime. , Disp: , Rfl:    escitalopram (LEXAPRO) 10 MG tablet, Take 10 mg by mouth daily. , Disp: , Rfl:    fluticasone (FLONASE) 50 MCG/ACT nasal spray, PLACE 2 SPRAYS INTO BOTH NOSTRILS 2 (TWO) TIMES DAILY AS NEEDED FOR ALLERGIES, Disp: , Rfl:    fluticasone (FLOVENT HFA) 220 MCG/ACT inhaler, TAKE 2 PUFFS BY MOUTH TWICE A DAY, Disp: 36 Inhaler, Rfl: 1   hydrALAZINE (APRESOLINE) 25 MG tablet, Take 25 mg by mouth at bedtime. , Disp: , Rfl:    ibuprofen (ADVIL,MOTRIN) 800 MG tablet, Take 800 mg by mouth 3 (three) times daily as needed. , Disp: , Rfl:    Insulin Detemir (LEVEMIR FLEXTOUCH) 100 UNIT/ML Pen, INJECT 90 UNITS SUBCUTANEOUSLY TWICE A DAY, Disp: , Rfl:    Insulin Pen Needle (FIFTY50 PEN NEEDLES) 32G X 6 MM MISC, 5 (five) times daily., Disp: , Rfl:    insulin regular human CONCENTRATED (HUMULIN R) 500 UNIT/ML kwikpen, Inject 70-75 Units into the skin 3 (three) times daily with meals. PER SLIDING  SCALE, Disp: , Rfl:    ketoconazole (NIZORAL) 2 % shampoo, APPLY TO AFFECTED AREAS (LEAVE ON FOR 5 MINUTES) , RINSE WELL, USE 2-3 TIMES PER WEEK., Disp: , Rfl: 11   lactulose (CHRONULAC) 10 GM/15ML solution, Take 10 g by mouth daily as needed for mild constipation., Disp: , Rfl:    levothyroxine (SYNTHROID, LEVOTHROID) 150 MCG tablet, Take 150 mcg by mouth daily before breakfast. , Disp: , Rfl: 11   lubiprostone (AMITIZA) 24 MCG capsule, TAKE 1 CAPSULE (24 MCG TOTAL) BY MOUTH 2 (TWO) TIMES DAILY WITH MEALS., Disp: , Rfl:    metoCLOPramide (REGLAN) 10 MG tablet, Take 10 mg by mouth 3 (three) times daily., Disp: , Rfl:    [START ON 04/30/2019] oxyCODONE-acetaminophen (PERCOCET) 10-325 MG tablet, Take 1 tablet by mouth 2 (two) times daily as needed for up to 30 days for pain., Disp: 60 tablet, Rfl: 0   pantoprazole (PROTONIX) 40 MG tablet, TAKE 1 TABLET (40 MG TOTAL) BY MOUTH 2 (TWO) TIMES DAILY. TAKE ONE HOUR BEFORE A MEAL, Disp: , Rfl:    potassium chloride (K-DUR,KLOR-CON) 10 MEQ tablet, TAKE 1 TABLET BY MOUTH ONCE A DAY, Disp: , Rfl:    pramipexole (MIRAPEX) 0.5 MG tablet, TAKE 2 TABLETS BY MOUTH AT BEDTIME, Disp: , Rfl:    sucralfate (CARAFATE) 1 g tablet, Take 1 tablet by mouth daily., Disp: , Rfl:    traMADol (ULTRAM) 50 MG tablet, Take 100 mg by mouth 3 (three) times daily., Disp: , Rfl: 3   traZODone (DESYREL) 150 MG tablet, Take 150 mg by mouth at bedtime. , Disp: , Rfl:    zolpidem (AMBIEN) 10 MG tablet, Take  10 mg by mouth at bedtime. , Disp: , Rfl:    gabapentin (NEURONTIN) 600 MG tablet, Take 1 tablet (600 mg total) by mouth 6 (six) times daily. (Patient taking differently: Take 600 mg by mouth See admin instructions. ), Disp: 540 tablet, Rfl: 0   lidocaine (LIDODERM) 5 %, PLACE 1 PATCH ONTO THE MOST PAINFUL AREA OF SKIN DAILY FOR UP TO 12 HOURS IN A 24 HOUR PERIOD, Disp: , Rfl: 5   traMADol (ULTRAM) 50 MG tablet, Take 2 tablets (100 mg total) by mouth every 8 (eight) hours.,  Disp: 180 tablet, Rfl: 2  ROS  Constitutional: Denies any fever or chills Gastrointestinal: No reported hemesis, hematochezia, vomiting, or acute GI distress Musculoskeletal: Denies any acute onset joint swelling, redness, loss of ROM, or weakness Neurological: No reported episodes of acute onset apraxia, aphasia, dysarthria, agnosia, amnesia, paralysis, loss of coordination, or loss of consciousness  Allergies  Ms. Tilly is allergic to eggs or egg-derived products; shellfish allergy; amlodipine; codeine; imdur [isosorbide dinitrate]; lyrica [pregabalin]; monosodium glutamate; and tape.  PFSH  Drug: Ms. Binford  reports no history of drug use. Alcohol:  reports no history of alcohol use. Tobacco:  reports that she has never smoked. She has never used smokeless tobacco. Medical:  has a past medical history of Abdominal pain, left upper quadrant (12/11/2012), Arthritis, Asthma, Broken leg (2014), Cancer ([place]) (2007), Chronic kidney disease, stage 2 (mild), Chronic lower back pain, Collagen vascular disease ([place]), Coronary artery disease, Diabetes mellitus without complication ([place]), Diabetic nephropathy associated with secondary diabetes mellitus ([place]), Flushing (12/11/2012), Gastroparesis, Gross hematuria (12/11/2012), Hepatic cirrhosis ([place]), Hyperlipidemia, Hypertension, Hypothyroidism, Hypothyroidism, IBS (irritable bowel syndrome), Kidney stone (12/11/2012), Lower extremity edema, Morbid obesity with BMI of 40.0-44.9, adult ([place]), Myocardial infarction ([place]) (09/2013), Nausea without vomiting (12/11/2012), Nephrolithiasis, Nephrolithiasis (04/26/2014), Nonproliferative retinopathy due to secondary diabetes ([place]), Numbness and tingling of right leg, Peripheral vascular disease ([place]), Renal colic (81/19/1478), Sciatica, Sciatica (12/11/2012), Skin cancer (08/2018), Sleep apnea, and Unilateral small kidney without contralateral hypertrophy. Surgical: Ms. Shepler  has a past surgical history that  includes Cesarean section (1980); Dilation and curettage of uterus; Eye surgery (1986); Abdominal hysterectomy; Tonsillectomy; Esophagogastroduodenoscopy (02/08/2014); Colonoscopy (05/04/2001); Esophagogastroduodenoscopy (egd) with propofol (N/A, 09/20/2016); Colonoscopy with propofol (N/A, 09/20/2016); Mohs surgery (11/2016); Cholecystectomy; Hernia repair; Joint replacement (Left, 09/24/2013); Back surgery (04/1999); Pulse generator implant (N/A, 11/02/2017); and Esophagogastroduodenoscopy (egd) with propofol (N/A, 05/23/2018). Family: family history includes Asthma in her mother; Cancer in her mother; Diabetes in her father; Heart disease in her mother; Hypertension in her father; Kidney disease in her father.  Constitutional Exam  General appearance: alert, cooperative and in mild distress Vitals:   03/14/19 1349 03/14/19 1350  BP:  (!) 176/67  Pulse: 74 71  Resp: 18   Temp: 98.6 F (37 C)   SpO2: 96%   Weight: 299 lb (135.6 kg)   Height: _0  (1.676 m)    BMI Assessment: Estimated body mass index is 48.26 kg/m as calculated from the following:   Height as of this encounter: _1  (1.676 m).   Weight as of this encounter: 299 lb (135.6 kg).  BMI interpretation table: BMI level Category Range association with higher incidence of chronic pain  <18 kg/m2 Underweight   18.5-24.9 kg/m2 Ideal body weight   25-29.9 kg/m2 Overweight Increased incidence by 20%  30-34.9 kg/m2 Obese (Class I) Increased incidence by 68%  35-39.9 kg/m2 Severe obesity (Class II) Increased incidence by 136%  >40 kg/m2 Extreme obesity (Class III) Increased incidence  by 254%   Patient's current BMI Ideal Body weight  Body mass index is 48.26 kg/m. Ideal body weight: 59.3 kg (130 lb 11.7 oz) Adjusted ideal body weight: 89.8 kg (198 lb 0.6 oz)   BMI Readings from Last 4 Encounters:  03/14/19 48.26 kg/m  01/03/19 48.74 kg/m  10/19/18 47.78 kg/m  09/28/18 48.74 kg/m   Wt Readings from Last 4 Encounters:    03/14/19 299 lb (135.6 kg)  01/03/19 (!) 302 lb (137 kg)  10/19/18 296 lb (134.3 kg)  09/28/18 (!) 302 lb (137 kg)  Psych/Mental status: Alert, oriented x 3 (person, place, & time)       Eyes: PERLA Respiratory: No evidence of acute respiratory distress  Cervical Spine Area Exam  Skin & Axial Inspection: No masses, redness, edema, swelling, or associated skin lesions Alignment: Symmetrical Functional ROM: Pain restricted ROM, to the left Stability: No instability detected Muscle Tone/Strength: Functionally intact. No obvious neuro-muscular anomalies detected. Sensory (Neurological): Musculoskeletal pain pattern Palpation: Complains of area being tender to palpation left sternocleidomastoid and trapezius region               Thoracic Spine Area Exam  Skin & Axial Inspection: No masses, redness, or swelling Alignment: Symmetrical Functional ROM: Decreased ROM Stability: No instability detected Muscle Tone/Strength: Functionally intact. No obvious neuro-muscular anomalies detected. Sensory (Neurological): Musculoskeletal pain pattern Muscle strength & Tone: No palpable anomalies   Lumbar Spine Area Exam  Skin & Axial Inspection:Well healed scar from previous spine surgery detected Alignment:Symmetrical Functional WIO:[id number] ROM Stability:No instability detected Muscle Tone/Strength:Functionally intact. No obvious neuro-muscular anomalies detected. Sensory (Neurological):Dermatomal pain pattern Palpation:Complains of area being tender to palpationBilateral Fist Percussion Test, IPG intact, small mass (lipoma?) palpated  Provocative Tests: Lumbar Hyperextension and rotation test:Positivebilaterally for facet joint pain.R>L Lumbar Lateral bending test:Positivedue to fusion restriction. Patrick's Maneuver:evaluation deferred today  Gait & Posture Assessment  Ambulation:Patient ambulates using a walker Gait:Limited. Using assistive device  to ambulate Posture:Difficulty standing up straight, due to pain  Lower Extremity Exam    Side:Right lower extremity  Side:Left lower extremity  Skin & Extremity Inspection:Skin color, temperature, and hair growth are WNL. No peripheral edema or cyanosis. No masses, redness, swelling, asymmetry, or associated skin lesions. No contractures.  Skin & Extremity Inspection:Skin color, temperature, and hair growth are WNL. No peripheral edema or cyanosis. No masses, redness, swelling, asymmetry, or associated skin lesions. No contractures.  Functional EQA:[id number] ROM  Functional LNL:[id number] ROM  Muscle Tone/Strength:weakness; 4- out of 5 strength right plantarflexion, dorsiflexion, hip flexion, knee extension. Limited by pain  Muscle Tone/Strength:4- out of 5 strength right plantarflexion, dorsiflexion, hip flexion, knee extension. Limited by pain  Sensory (Neurological):Paresthesia (Tingling sensation)  Sensory (Neurological):Paresthesia (Burning sensation)  Palpation:No palpable anomalies  Palpation:No palpable anomalies     Assessment   Status Diagnosis  Persistent Persistent Persistent 1. Status post lumbar spine surgery for decompression of spinal cord   2. Spinal cord stimulator status (Nevro Paddle)   3. L3-4 severe lumbar facet hypertrophy and spinal stenosis   4. Failed back surgical syndrome   5. Chronic pain syndrome   6. Neurogenic pain   7. Myofascial pain   8. Fibromyalgia      General Recommendations: The pain condition that the patient suffers from is best treated with a multidisciplinary approach that involves an increase in physical activity to prevent de-conditioning and worsening of the pain cycle, as well as psychological counseling (formal and/or informal) to address the co-morbid psychological affects of pain. Treatment will  often involve judicious use of pain medications and interventional procedures to decrease the pain,  allowing the patient to participate in the physical activity that will ultimately produce long-lasting pain reductions. The goal of the multidisciplinary approach is to return the patient to a higher level of overall function and to restore their ability to perform activities of daily living.  Ms. Helderman follows up today for medication management.  She endorses difficulty over the last month in regards to ambulating and performing ADLs.  She describes increased pain in her low back along with weakness with ambulation.  This is been chronic in nature for her but is gotten worse.  Patient has multiple medical comorbidities in addition to her chronic lumbar radicular and stenosis issues.  She is continuing to utilize her spinal cord stimulator and states that it is moderately effective at best.  There appears to be a small mass possibly lipoma overlying the IPG site.  Patient is requesting to increase her oxycodone dosing.  I expressed my reservations and concerns about this.  Patient is already on tramadol 100 mg 3 times daily as needed along with Xanax as needed to manage her anxiety as well as Ambien.  Patient also has a history of morbid obesity as well as obstructive sleep apnea, compliant on CPAP therapy.  Therefore do not recommend increase in short acting opioid medication.  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 I explained the risks of concomitant benzodiazepine and opioid therapy when itcomes to cardiovascular and respiratory risk associated with concomitant therapy. I recommended the patient try and discontinue her Xanax and if she must take it to not take it within 60 to 90 minutes of taking  Percocet. I also recommended the patient discontinue her Ambien as it is limiting her ability inreaching stage IV and REM sleep which could be contributing to not feeling well rested when she wakes up. Patient is a previous pharmacy tech and endorsed understanding about timing of medications. She states that she will try to take as little Xanax as possible for anxiety. I discussed opioid tolerance as well as the risks of chronic opioid therapy with this patient. At this point, I feel that we are limited in options.  I encouraged the patient to practice techniques that she had learned with her pain psychologist in the past including deep breathing exercises, distraction techniques, mindfulness exercises when she has pain flares.  For the patient's left cervical myofascial pain, patient would like to repeat cervical and trapezius trigger point injections.  We will get her scheduled for that.  I will refill her oxycodone as below, no dose escalation.  Patient instructed to continue all of her other multimodal analgesics that are prescribed by her PCP.   Plan of Care  Pharmacotherapy (Medications Ordered): Meds ordered this encounter  Medications   oxyCODONE-acetaminophen (PERCOCET) 10-325 MG tablet    Sig: Take 1 tablet by mouth 2 (two) times daily as needed for up to 30 days for pain.    Dispense:  60 tablet    Refill:  0   Lab-work, procedure(s), and/or referral(s): Orders Placed This Encounter  Procedures   TRIGGER POINT INJECTION    Provider-requested follow-up: Return in about 10 weeks (around 05/23/2019) for Medication Management.  Time  Note: Greater than 50% of the 25 minute(s) of face-to-face time spent with Ms. Flaim, was spent in counseling/coordination of care regarding: the appropriate use of the pain scale, opioid tolerance, "Drug Holidays", Ms. Widger primary cause of pain, the treatment plan, treatment alternatives, medication side effects, going over the informed  consent, the opioid analgesic risks and possible complications, the appropriate use of her medications, realistic expectations, the goals of pain management (increased in functionality), the need to bring and keep the BMI below 30, the medication agreement and the patient's responsibilities when it comes to controlled substances.  Future Appointments  Date Time Provider [place]  03/28/2019 12:30 PM [name], MD [facility] None  04/03/2019 12:45 PM [facility] None  04/03/2019  1:00 PM Lloyd Huger, MD [facility] None  04/03/2019  1:30 PM [facility]FUSION CHAIR 1 [facility] None  05/22/2019  1:00 PM [name], MD [facility] None  09/06/2019  2:15 PM Schnier, Dolores Lory, MD [facility] None    Primary Care Physician: Rusty Aus, MD Location: [facility] Note by: [name], M.D Date: 03/14/2019; Time: 8:33 AM  Patient Instructions   GENERAL RISKS AND COMPLICATIONS  What are the risk, side effects and possible complications? Generally speaking, most procedures are safe.  However, with any procedure there are risks, side effects, and the possibility of complications.  The risks and complications are dependent upon the sites that are lesioned, or the type of nerve block to be performed.  The closer the procedure is to the spine, the more serious the risks are.  Great care is taken when placing the radio frequency needles, block needles or lesioning probes, but sometimes complications can occur. 1. Infection: Any time there is an injection through the skin, there is a risk of infection.  This is why sterile conditions are used for these blocks.  There are four possible types of infection. 1. Localized skin infection. 2. Central Nervous System Infection-This can be in the form of Meningitis, which can be deadly. 3. Epidural Infections-This can be in the form of an epidural abscess, which can cause pressure inside of the spine, causing  compression of the spinal cord with subsequent paralysis. This would require an emergency surgery to decompress, and there are no guarantees that the patient would recover from the paralysis. 4. Discitis-This is an infection of the intervertebral discs.  It occurs in about 1% of discography procedures.  It is difficult to treat and it may lead to surgery.        2. Pain: the needles have to go through skin and soft tissues, will cause soreness.       3. Damage to internal structures:  The nerves to be lesioned may be near blood vessels or    other nerves which can be potentially damaged.       4. Bleeding: Bleeding is more common if the patient is taking blood thinners such as  aspirin, Coumadin, Ticiid, Plavix, etc., or if he/she have some genetic predisposition  such as hemophilia. Bleeding into the spinal canal can cause compression of the spinal  cord with subsequent paralysis.  This would require an emergency surgery to  decompress and there are no guarantees that the patient would recover from the  paralysis.       5. Pneumothorax:  Puncturing of a lung is a possibility, every time a needle is introduced in  the area of the chest or upper back.  Pneumothorax refers to free  air around the  collapsed lung(s), inside of the thoracic cavity (chest cavity).  Another two possible  complications related to a similar event would include: Hemothorax and Chylothorax.   These are variations of the Pneumothorax, where instead of air around the collapsed  lung(s), you may have blood or chyle, respectively.       6. Spinal headaches: They may occur with any procedures in the area of the spine.       7. Persistent CSF (Cerebro-Spinal Fluid) leakage: This is a rare problem, but may occur  with prolonged intrathecal or epidural catheters either due to the formation of a fistulous  track or a dural tear.       8. Nerve damage: By working so close to the spinal cord, there is always a possibility of  nerve damage,  which could be as serious as a permanent spinal cord injury with  paralysis.       9. Death:  Although rare, severe deadly allergic reactions known as "Anaphylactic  reaction" can occur to any of the medications used.      10. Worsening of the symptoms:  We can always make thing worse.  What are the chances of something like this happening? Chances of any of this occuring are extremely low.  By statistics, you have more of a chance of getting killed in a motor vehicle accident: while driving to the hospital than any of the above occurring .  Nevertheless, you should be aware that they are possibilities.  In general, it is similar to taking a shower.  Everybody knows that you can slip, hit your head and get killed.  Does that mean that you should not shower again?  Nevertheless always keep in mind that statistics do not mean anything if you happen to be on the wrong side of them.  Even if a procedure has a 1 (one) in a 1,000,000 (million) chance of going wrong, it you happen to be that one..Also, keep in mind that by statistics, you have more of a chance of having something go wrong when taking medications.  Who should not have this procedure? If you are on a blood thinning medication (e.g. Coumadin, Plavix, see list of "Blood Thinners"), or if you have an active infection going on, you should not have the procedure.  If you are taking any blood thinners, please inform your physician.  How should I prepare for this procedure?  Do not eat or drink anything at least six hours prior to the procedure.  Bring a driver with you .  It cannot be a taxi.  Come accompanied by an adult that can drive you back, and that is strong enough to help you if your legs get weak or numb from the local anesthetic.  Take all of your medicines the morning of the procedure with just enough water to swallow them.  If you have diabetes, make sure that you are scheduled to have your procedure done first thing in the morning,  whenever possible.  If you have diabetes, take only half of your insulin dose and notify our nurse that you have done so as soon as you arrive at the clinic.  If you are diabetic, but only take blood sugar pills (oral hypoglycemic), then do not take them on the morning of your procedure.  You may take them after you have had the procedure.  Do not take aspirin or any aspirin-containing medications, at least eleven (11) days prior to the procedure.  They may prolong bleeding.  Wear loose fitting clothing that may be easy to take off and that you would not mind if it got stained with Betadine or blood.  Do not wear any jewelry or perfume  Remove any nail coloring.  It will interfere with some of our monitoring equipment.  NOTE: Remember that this is not meant to be interpreted as a complete list of all possible complications.  Unforeseen problems may occur.  BLOOD THINNERS The following drugs contain aspirin or other products, which can cause increased bleeding during surgery and should not be taken for 2 weeks prior to and 1 week after surgery.  If you should need take something for relief of minor pain, you may take acetaminophen which is found in Tylenol,m Datril, Anacin-3 and Panadol. It is not blood thinner. The products listed below are.  Do not take any of the products listed below in addition to any listed on your instruction sheet.  A.P.C or A.P.C with Codeine Codeine Phosphate Capsules #3 Ibuprofen Ridaura  ABC compound Congesprin Imuran rimadil  Advil Cope Indocin Robaxisal  Alka-Seltzer Effervescent Pain Reliever and Antacid Coricidin or Coricidin-D  Indomethacin Rufen  Alka-Seltzer plus Cold Medicine Cosprin Ketoprofen S-A-C Tablets  Anacin Analgesic Tablets or Capsules Coumadin Korlgesic Salflex  Anacin Extra Strength Analgesic tablets or capsules CP-2 Tablets Lanoril Salicylate  Anaprox Cuprimine Capsules Levenox Salocol  Anexsia-D Dalteparin Magan Salsalate  Anodynos Darvon  compound Magnesium Salicylate Sine-off  Ansaid Dasin Capsules Magsal Sodium Salicylate  Anturane Depen Capsules Marnal Soma  APF Arthritis pain formula Dewitt's Pills Measurin Stanback  Argesic Dia-Gesic Meclofenamic Sulfinpyrazone  Arthritis Bayer Timed Release Aspirin Diclofenac Meclomen Sulindac  Arthritis pain formula Anacin Dicumarol Medipren Supac  Analgesic (Safety coated) Arthralgen Diffunasal Mefanamic Suprofen  Arthritis Strength Bufferin Dihydrocodeine Mepro Compound Suprol  Arthropan liquid Dopirydamole Methcarbomol with Aspirin Synalgos  ASA tablets/Enseals Disalcid Micrainin Tagament  Ascriptin Doan's Midol Talwin  Ascriptin A/D Dolene Mobidin Tanderil  Ascriptin Extra Strength Dolobid Moblgesic Ticlid  Ascriptin with Codeine Doloprin or Doloprin with Codeine Momentum Tolectin  Asperbuf Duoprin Mono-gesic Trendar  Aspergum Duradyne Motrin or Motrin IB Triminicin  Aspirin plain, buffered or enteric coated Durasal Myochrisine Trigesic  Aspirin Suppositories Easprin Nalfon Trillsate  Aspirin with Codeine Ecotrin Regular or Extra Strength Naprosyn Uracel  Atromid-S Efficin Naproxen Ursinus  Auranofin Capsules Elmiron Neocylate Vanquish  Axotal Emagrin Norgesic Verin  Azathioprine Empirin or Empirin with Codeine Normiflo Vitamin E  Azolid Emprazil Nuprin Voltaren  Bayer Aspirin plain, buffered or children's or timed BC Tablets or powders Encaprin Orgaran Warfarin Sodium  Buff-a-Comp Enoxaparin Orudis Zorpin  Buff-a-Comp with Codeine Equegesic Os-Cal-Gesic   Buffaprin Excedrin plain, buffered or Extra Strength Oxalid   Bufferin Arthritis Strength Feldene Oxphenbutazone   Bufferin plain or Extra Strength Feldene Capsules Oxycodone with Aspirin   Bufferin with Codeine Fenoprofen Fenoprofen Pabalate or Pabalate-SF   Buffets II Flogesic Panagesic   Buffinol plain or Extra Strength Florinal or Florinal with Codeine Panwarfarin   Buf-Tabs Flurbiprofen Penicillamine   Butalbital  Compound Four-way cold tablets Penicillin   Butazolidin Fragmin Pepto-Bismol   Carbenicillin Geminisyn Percodan   Carna Arthritis Reliever Geopen Persantine   Carprofen Gold's salt Persistin   Chloramphenicol Goody's Phenylbutazone   Chloromycetin Haltrain Piroxlcam   Clmetidine heparin Plaquenil   Cllnoril Hyco-pap Ponstel   Clofibrate Hydroxy chloroquine Propoxyphen         Before stopping any of these medications, be sure to consult the physician who ordered them.  Some, such as Coumadin (  Warfarin) are ordered to prevent or treat serious conditions such as "deep thrombosis", "pumonary embolisms", and other heart problems.  The amount of time that you may need off of the medication may also vary with the medication and the reason for which you were taking it.  If you are taking any of these medications, please make sure you notify your pain physician before you undergo any procedures.         Trigger Point Injection Trigger points are areas where you have pain. A trigger point injection is a shot given in the trigger point to help relieve pain for a few days to a few months. Common places for trigger points include:  The neck.  The shoulders.  The upper back.  The lower back. A trigger point injection will not cure long-lasting (chronic) pain permanently. These injections do not always work for every person, but for some people they can help to relieve pain for a few days to a few months. Tell a health care provider about:  Any allergies you have.  All medicines you are taking, including vitamins, herbs, eye drops, creams, and over-the-counter medicines.  Any problems you or family members have had with anesthetic medicines.  Any blood disorders you have.  Any surgeries you have had.  Any medical conditions you have. What are the risks? Generally, this is a safe procedure. However, problems may occur, including:  Infection.  Bleeding.  Allergic reaction to the  injected medicine.  Irritation of the skin around the injection site. What happens before the procedure?  Ask your health care provider about changing or stopping your regular medicines. This is especially important if you are taking diabetes medicines or blood thinners. What happens during the procedure?  Your health care provider will feel for trigger points. A marker may be used to circle the area for the injection.  The skin over the trigger point will be washed with a germ-killing (antiseptic) solution.  A thin needle is used for the shot. You may feel pain or a twitching feeling when the needle enters the trigger point.  A numbing solution may be injected into the trigger point. Sometimes a medicine to keep down swelling, redness, and warmth (inflammation) is also injected.  Your health care provider may move the needle around the area where the trigger point is located until the tightness and twitching goes away.  After the injection, your health care provider may put gentle pressure over the injection site.  The injection site will be covered with a bandage (dressing). The procedure may vary among health care providers and hospitals. What happens after the procedure?  The dressing can be taken off in a few hours or as told by your health care provider.  You may feel sore and stiff for 1-2 days. This information is not intended to replace advice given to you by your health care provider. Make sure you discuss any questions you have with your health care provider. Document Released: 12/02/2011 Document Revised: 08/15/2016 Document Reviewed: 06/02/2015 Elsevier Interactive Patient Education  2019 [employer].

## 2019-03-17 ENCOUNTER — Other Ambulatory Visit: Payer: Self-pay | Admitting: Pulmonary Disease

## 2019-03-28 ENCOUNTER — Other Ambulatory Visit: Payer: Self-pay

## 2019-03-28 ENCOUNTER — Encounter: Payer: Self-pay | Admitting: Student in an Organized Health Care Education/Training Program

## 2019-03-28 ENCOUNTER — Ambulatory Visit
Payer: Medicare Other | Attending: Student in an Organized Health Care Education/Training Program | Admitting: Student in an Organized Health Care Education/Training Program

## 2019-03-28 VITALS — BP 190/70 | HR 66 | Temp 98.8°F | Resp 16 | Ht 66.0 in | Wt 299.0 lb

## 2019-03-28 DIAGNOSIS — M961 Postlaminectomy syndrome, not elsewhere classified: Secondary | ICD-10-CM | POA: Insufficient documentation

## 2019-03-28 DIAGNOSIS — M48061 Spinal stenosis, lumbar region without neurogenic claudication: Secondary | ICD-10-CM

## 2019-03-28 DIAGNOSIS — G894 Chronic pain syndrome: Secondary | ICD-10-CM | POA: Diagnosis present

## 2019-03-28 DIAGNOSIS — M47816 Spondylosis without myelopathy or radiculopathy, lumbar region: Secondary | ICD-10-CM | POA: Diagnosis present

## 2019-03-28 DIAGNOSIS — M4804 Spinal stenosis, thoracic region: Secondary | ICD-10-CM

## 2019-03-28 DIAGNOSIS — M7918 Myalgia, other site: Secondary | ICD-10-CM | POA: Diagnosis not present

## 2019-03-28 DIAGNOSIS — M5416 Radiculopathy, lumbar region: Secondary | ICD-10-CM | POA: Insufficient documentation

## 2019-03-28 MED ORDER — ROPIVACAINE HCL 2 MG/ML IJ SOLN
1.0000 mL | Freq: Once | INTRAMUSCULAR | Status: AC
  Administered 2019-03-28: 10 mL via EPIDURAL
  Filled 2019-03-28: qty 10

## 2019-03-28 NOTE — Progress Notes (Signed)
Patient's Name: Brittney [name]  MRN: [id number]  Referring Provider: Rusty Aus, MD  DOB: November 29, 1951  PCP: Rusty Aus, MD  DOS: 03/28/2019  Note by: [name], MD  Service setting: Ambulatory outpatient  Specialty: Interventional Pain Management  Patient type: Established  Location: [facility] (AMB) Pain Management Facility  Visit type: Interventional Procedure   Primary Reason for Visit: Interventional Pain Management Treatment. CC: Back Pain (right side down the right leg.)  Procedure:  Anesthesia, Analgesia, Anxiolysis:  Type: Trigger Point Injection (3+ muscle groups) CPT: 20553 Primary Purpose: Diagnostic Region: Left trapezius, cervical region Level: Cervico-thoracic Target Area: Trigger Point Approach: Ipsilateral approach. Laterality: Left-Sided   Position: Sitting  Type: Local Anesthesia Local Anesthetic: Lidocaine 1% Route: Infiltration (Wal[place]/IM) IV Access: Declined Sedation: Declined  Indication(s): Analgesia           Indications: 1. Myofascial pain   2. 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 Failed back surgical syndrome   4. L3-4 severe lumbar spinal stenosis (12/31/2016 MRI)   5. Spinal stenosis of thoracic region   6. Chronic pain syndrome    Pain Score: Pre-procedure: 7 /10 Post-procedure: 1 /10  Pre-op Assessment:  Brittney [name] is a 68 y.o. (year old), female patient, seen today for interventional treatment. She  has a past surgical history that includes Cesarean section (1980); Dilation and curettage of uterus; Eye surgery (1986); Abdominal hysterectomy; Tonsillectomy; Esophagogastroduodenoscopy (02/08/2014); Colonoscopy (05/04/2001); Esophagogastroduodenoscopy (egd) with propofol (N/A, 09/20/2016); Colonoscopy with propofol (N/A, 09/20/2016); Mohs surgery (11/2016); Cholecystectomy; Hernia repair; Joint replacement (Left, 09/24/2013); Back surgery (04/1999); Pulse generator implant (N/A, 11/02/2017); and Esophagogastroduodenoscopy (egd)  with propofol (N/A, 05/23/2018). Brittney [name] has a current medication list which includes the following prescription(s): albuterol, alprazolam, bumetanide, butalbital-acetaminophen-caffeine, candesartan, carvedilol, clonidine, cyclobenzaprine, diclofenac sodium, doxazosin, escitalopram, fluticasone, fluticasone, gabapentin, hydralazine, ibuprofen, insulin detemir, insulin pen needle, insulin regular human concentrated, ketoconazole, lactulose, levothyroxine, lubiprostone, metoclopramide, oxycodone-acetaminophen, pantoprazole, potassium chloride, pramipexole, sucralfate, tramadol, trazodone, zolpidem, diclofenac sodium, lidocaine, and tramadol, and the following Facility-Administered Medications: ropivacaine (pf) 2 mg/ml (0.2%) and ropivacaine (pf) 2 mg/ml (0.2%). Her primarily concern today is the Back Pain (right side down the right leg.)  Initial Vital Signs:  Pulse Rate: 71 Temp: 98.8 F (37.1 C) Resp: 18 BP: (!) 204/77 SpO2: 96 %  BMI: Estimated body mass index is 48.26 kg/m as calculated from the following:   Height as of this encounter: 5\' 6"  (1.676 m).   Weight as of this encounter: 299 lb (135.6 kg).  Risk Assessment: Allergies: Reviewed. She is allergic to eggs or egg-derived products; shellfish allergy; amlodipine; codeine; imdur [isosorbide dinitrate]; lyrica [pregabalin]; monosodium glutamate; and tape.  Allergy Precautions: None required Coagulopathies: Reviewed. None identified.  Blood-thinner therapy: None at this time Active Infection(s): Reviewed. None identified. Brittney [name] is afebrile  Site Confirmation: Brittney [name] was asked to confirm the procedure and laterality before marking the site Procedure checklist: Completed Consent: Before the procedure and under the influence of no sedative(s), amnesic(s), or anxiolytics, the patient was informed of the treatment options, risks and possible complications. To fulfill our ethical and legal obligations, as recommended by the  American Medical Association's Code of Ethics, I have informed the patient of my clinical impression; the nature and purpose of the treatment or procedure; the risks, benefits, and possible complications of the intervention; the alternatives, including doing nothing; the risk(s) and benefit(s) of the alternative treatment(s) or procedure(s); and the risk(s) and benefit(s) of doing nothing. The patient was provided information  about the general risks and possible complications associated with the procedure. These may include, but are not limited to: failure to achieve desired goals, infection, bleeding, organ or nerve damage, allergic reactions, paralysis, and death. In addition, the patient was informed of those risks and complications associated to the procedure, such as failure to decrease pain; infection; bleeding; organ or nerve damage with subsequent damage to sensory, motor, and/or autonomic systems, resulting in permanent pain, numbness, and/or weakness of one or several areas of the body; allergic reactions; (i.e.: anaphylactic reaction); and/or death. Furthermore, the patient was informed of those risks and complications associated with the medications. These include, but are not limited to: allergic reactions (i.e.: anaphylactic or anaphylactoid reaction(s)); adrenal axis suppression; blood sugar elevation that in diabetics may result in ketoacidosis or comma; water retention that in patients with history of congestive heart failure may result in shortness of breath, pulmonary edema, and decompensation with resultant heart failure; weight gain; swelling or edema; medication-induced neural toxicity; particulate matter embolism and blood vessel occlusion with resultant organ, and/or nervous system infarction; and/or aseptic necrosis of one or more joints. Finally, the patient was informed that Medicine is not an exact science; therefore, there is also the possibility of unforeseen or unpredictable risks  and/or possible complications that may result in a catastrophic outcome. The patient indicated having understood very clearly. We have given the patient no guarantees and we have made no promises. Enough time was given to the patient to ask questions, all of which were answered to the patient's satisfaction. Ms. Self has indicated that she wanted to continue with the procedure. Attestation: I, the ordering provider, attest that I have discussed with the patient the benefits, risks, side-effects, alternatives, likelihood of achieving goals, and potential problems during recovery for the procedure that I have provided informed consent. Date: 03/28/2019  Time: N/A  Pre-Procedure Preparation:  Monitoring: As per clinic protocol. Respiration, ETCO2, SpO2, BP, heart rate and rhythm monitor placed and checked for adequate function Safety Precautions: Patient was assessed for positional comfort and pressure points before starting the procedure. Time-out: I initiated and conducted the "Time-out" before starting the procedure, as per protocol. The patient was asked to participate by confirming the accuracy of the "Time Out" information. Verification of the correct person, site, and procedure were performed and confirmed by me, the nursing staff, and the patient. "Time-out" conducted as per Joint Commission's Universal Protocol (UP.01.01.01). "Time-out" Date & Time: 03/28/2019; 0934 hrs.  Description of Procedure Process:   Area Prepped: Entire Lateral Cervicothoracic Region Prepping solution: ChloraPrep (2% chlorhexidine gluconate and 70% isopropyl alcohol) Safety Precautions: Aspiration looking for blood return was conducted prior to all injections. At no point did we inject any substances, as a needle was being advanced. No attempts were made at seeking any paresthesias. Safe injection practices and needle disposal techniques used. Medications properly checked for expiration dates. SDV (single dose vial)  medications used. Description of the Procedure: Protocol guidelines were followed. The patient was placed in position over the fluoroscopy table. The target area was identified and the area prepped in the usual manner. Skin desensitized using vapocoolant spray. Skin & deeper tissues infiltrated with local anesthetic. Appropriate amount of time allowed to pass for local anesthetics to take effect. The procedure needles were then advanced to the target area. Proper needle placement secured. Negative aspiration confirmed. Solution injected in intermittent fashion, asking for systemic symptoms every 0.5cc of injectate. The needles were then removed and the area cleansed, making sure to leave  some of the prepping solution back to take advantage of its long term bactericidal properties. Vitals:   03/28/19 0911 03/28/19 0944  BP: (!) 204/77 (!) 190/70  Pulse: 71 66  Resp: 18 16  Temp: 98.8 F (37.1 C)   TempSrc: Oral   SpO2: 96% 96%  Weight: 299 lb (135.6 kg)   Height: 5\' 6"  (1.676 m)     Start Time: 0935 hrs. End Time: 0942 hrs. Materials:  Needle(s) Type: Regular needle Gauge: 25G Length: 1.5-in Medication(s): Please see orders for medications and dosing details. 15 trigger points injected with 1 cc of 0.2% ropivacaine at each trigger point. Dry needling performed. Imaging Guidance:  Type of Imaging Technique: None used Indication(s): N/A Exposure Time: No patient exposure Contrast: None used. Fluoroscopic Guidance: N/A Ultrasound Guidance: N/A Interpretation: N/A  Antibiotic Prophylaxis:   Antibiotics Given (last 72 hours)    None     Indication(s): None identified  Post-operative Assessment:  Post-procedure Vital Signs:  Pulse Rate: 66 Temp: 98.8 F (37.1 C) Resp: 16 BP: (!) 190/70 SpO2: 96 %  EBL: None  Complications: No immediate post-treatment complications observed by team, or reported by patient.  Note: The patient tolerated the entire procedure well. A repeat  set of vitals were taken after the procedure and the patient was kept under observation following institutional policy, for this type of procedure. Post-procedural neurological assessment was performed, showing return to baseline, prior to discharge. The patient was provided with post-procedure discharge instructions, including a section on how to identify potential problems. Should any problems arise concerning this procedure, the patient was given instructions to immediately contact us, at any time, without hesitation. In any case, we plan to contact the patient by telephone for a follow-up status report regarding this interventional procedure.  Comments:  No additional relevant information.  Plan of Care   Imaging Orders  No imaging studies ordered today   Procedure Orders    No procedure(s) ordered today    Medications ordered for procedure: Meds ordered this encounter  Medications  . ropivacaine (PF) 2 mg/mL (0.2%) (NAROPIN) injection 1 mL  . ropivacaine (PF) 2 mg/mL (0.2%) (NAROPIN) injection 1 mL   Medications administered: [name] "Pam" had no medications administered during this visit.  See the medical record for exact dosing, route, and time of administration.  New Prescriptions   No medications on file   Disposition: Discharge home  Discharge Date & Time: 03/28/2019; 1000 hrs.   Physician-requested Follow-up:   Future Appointments  Date Time Provider [place]  04/03/2019 12:45 PM [facility] None  04/03/2019  1:00 PM Lloyd Huger, MD [facility] None  04/03/2019  1:30 PM [facility] INFUSION CHAIR 1 [facility] None  05/22/2019  1:00 PM [name], MD [facility] None  09/06/2019  2:15 PM Schnier, Dolores Lory, MD [facility] None   Primary Care Physician: Rusty Aus, MD Location: [facility] Note by: [name], MD Date: 03/28/2019; Time: 9:48 AM  Disclaimer:  Medicine is not an exact science. The only  guarantee in medicine is that nothing is guaranteed. It is important to note that the decision to proceed with this intervention was based on the information collected from the patient. The Data and conclusions were drawn from the patient's questionnaire, the interview, and the physical examination. Because the information was provided in large part by the patient, it cannot be guaranteed that it has not been purposely or unconsciously manipulated. Every effort has been made  to obtain as much relevant data as possible for this evaluation. It is important to note that the conclusions that lead to this procedure are derived in large part from the available data. Always take into account that the treatment will also be dependent on availability of resources and existing treatment guidelines, considered by other Pain Management Practitioners as being common knowledge and practice, at the time of the intervention. For Medico-Legal purposes, it is also important to point out that variation in procedural techniques and pharmacological choices are the acceptable norm. The indications, contraindications, technique, and results of the above procedure should only be interpreted and judged by a Board-Certified Interventional Pain Specialist with extensive familiarity and expertise in the same exact procedure and technique.

## 2019-03-28 NOTE — Progress Notes (Signed)
Safety precautions to be maintained throughout the outpatient stay will include: orient to surroundings, keep bed in low position, maintain call bell within reach at all times, provide assistance with transfer out of bed and ambulation.  

## 2019-03-28 NOTE — Patient Instructions (Signed)

## 2019-03-29 ENCOUNTER — Telehealth: Payer: Self-pay | Admitting: *Deleted

## 2019-03-29 NOTE — Telephone Encounter (Signed)
Talked to patient re; TPI on yesterday.  Verbalizes that site is a little sore but the pain is gone.

## 2019-04-03 ENCOUNTER — Ambulatory Visit: Payer: Medicare Other

## 2019-04-03 ENCOUNTER — Ambulatory Visit: Payer: Medicare Other | Admitting: Oncology

## 2019-04-03 ENCOUNTER — Other Ambulatory Visit: Payer: Medicare Other

## 2019-04-13 ENCOUNTER — Other Ambulatory Visit: Payer: Self-pay | Admitting: Internal Medicine

## 2019-04-13 ENCOUNTER — Ambulatory Visit
Admission: RE | Admit: 2019-04-13 | Discharge: 2019-04-13 | Disposition: A | Discharge status: Home / Self Care | Payer: Medicare Other | Source: Ambulatory Visit | Attending: Internal Medicine | Admitting: Internal Medicine

## 2019-04-13 DIAGNOSIS — R1031 Right lower quadrant pain: Secondary | ICD-10-CM

## 2019-04-13 HISTORY — DX: Disorder of kidney and ureter, unspecified: N28.9

## 2019-04-13 MED ORDER — IOHEXOL 300 MG/ML  SOLN
100.0000 mL | Freq: Once | INTRAMUSCULAR | Status: AC | PRN
  Administered 2019-04-13: 18:00:00 100 mL via INTRAVENOUS

## 2019-05-04 ENCOUNTER — Ambulatory Visit: Payer: Medicare Other

## 2019-05-04 ENCOUNTER — Ambulatory Visit: Payer: Medicare Other | Admitting: Oncology

## 2019-05-04 ENCOUNTER — Other Ambulatory Visit: Payer: Medicare Other

## 2019-05-17 ENCOUNTER — Encounter: Payer: Self-pay | Admitting: Student in an Organized Health Care Education/Training Program

## 2019-05-22 ENCOUNTER — Other Ambulatory Visit: Payer: Self-pay

## 2019-05-22 ENCOUNTER — Ambulatory Visit
Payer: Medicare Other | Attending: Student in an Organized Health Care Education/Training Program | Admitting: Student in an Organized Health Care Education/Training Program

## 2019-05-22 ENCOUNTER — Telehealth: Payer: Self-pay

## 2019-05-22 ENCOUNTER — Other Ambulatory Visit: Payer: Self-pay | Admitting: Pulmonary Disease

## 2019-05-22 DIAGNOSIS — M797 Fibromyalgia: Secondary | ICD-10-CM

## 2019-05-22 DIAGNOSIS — M7918 Myalgia, other site: Secondary | ICD-10-CM | POA: Diagnosis not present

## 2019-05-22 DIAGNOSIS — M47816 Spondylosis without myelopathy or radiculopathy, lumbar region: Secondary | ICD-10-CM

## 2019-05-22 DIAGNOSIS — M5416 Radiculopathy, lumbar region: Secondary | ICD-10-CM

## 2019-05-22 DIAGNOSIS — M961 Postlaminectomy syndrome, not elsewhere classified: Secondary | ICD-10-CM

## 2019-05-22 DIAGNOSIS — M792 Neuralgia and neuritis, unspecified: Secondary | ICD-10-CM

## 2019-05-22 DIAGNOSIS — Z9689 Presence of other specified functional implants: Secondary | ICD-10-CM

## 2019-05-22 DIAGNOSIS — M48061 Spinal stenosis, lumbar region without neurogenic claudication: Secondary | ICD-10-CM | POA: Diagnosis not present

## 2019-05-22 DIAGNOSIS — Z9889 Other specified postprocedural states: Secondary | ICD-10-CM

## 2019-05-22 DIAGNOSIS — M4804 Spinal stenosis, thoracic region: Secondary | ICD-10-CM

## 2019-05-22 DIAGNOSIS — G894 Chronic pain syndrome: Secondary | ICD-10-CM

## 2019-05-22 MED ORDER — FLUTICASONE PROPIONATE HFA 220 MCG/ACT IN AERO: INHALATION_SPRAY | RESPIRATORY_TRACT | 1 refills | Status: DC

## 2019-05-22 NOTE — Telephone Encounter (Signed)
-----   Message from [name], MD sent at 05/22/2019 11:58 AM EDT ----- Please call pharmacy ([facility]) and confirm that patient has a Percocet Rx written on 03/14/2019 that is available to be filled. If she does NOT, please let know. Thanks

## 2019-05-22 NOTE — Progress Notes (Signed)
Pain Management Virtual Encounter Note - Virtual Visit via [place] (real-time audio visits between healthcare provider and patient).  Patient's Phone No. & Preferred Pharmacy:  [phone] (home); [phone] (mobile); (Preferred) [phone] [email or web address]  [id number] - [place], [place] - [address] [address] Phone: [phone] Fax: [phone]   Pre-screening note:  Our staff contacted Brittney [name] and offered her an "in person", "face-to-face" appointment versus a telephone encounter. She indicated preferring the telephone encounter, at this time.  Reason for Virtual Visit: COVID-19*  Social distancing based on CDC and AMA recommendations.   I contacted Brittney [name] on 05/22/2019 at 11:57 AM via video conference.      I clearly identified myself as Brittney Santa, MD. I verified that I was speaking with the correct person using two identifiers (Name and date of birth: March 20, 1951).  Advanced Informed Consent I sought verbal advanced consent from Brittney [name] for virtual visit interactions. I informed Brittney [name] of possible security and privacy concerns, risks, and limitations associated with providing "not-in-person" medical evaluation and management services. I also informed Brittney [name] of the availability of "in-person" appointments. Finally, I informed her that there would be a charge for the virtual visit and that she could be  personally, fully or partially, financially responsible for it. Brittney [name] expressed understanding and agreed to proceed.   Historic Elements   Brittney [name] is a 68 y.o. year old, female patient evaluated today after her last encounter by our practice on 03/29/2019. Brittney [name]  has a past medical history of Abdominal pain, left upper quadrant (12/11/2012), Arthritis, Asthma, Broken leg (2014), Cancer ([place]) (2007), Chronic kidney disease, stage 2 (mild), Chronic lower back pain, Collagen  vascular disease ([place]), Coronary artery disease, Diabetes mellitus without complication ([place]), Diabetic nephropathy associated with secondary diabetes mellitus ([place]), Flushing (12/11/2012), Gastroparesis, Gross hematuria (12/11/2012), Hepatic cirrhosis ([place]), Hyperlipidemia, Hypertension, Hypothyroidism, Hypothyroidism, IBS (irritable bowel syndrome), Kidney stone (12/11/2012), Lower extremity edema, Morbid obesity with BMI of 40.0-44.9, adult ([place]), Myocardial infarction ([place]) (09/2013), Nausea without vomiting (12/11/2012), Nephrolithiasis, Nephrolithiasis (04/26/2014), Nonproliferative retinopathy due to secondary diabetes ([place]), Numbness and tingling of right leg, Peripheral vascular disease ([place]), Renal colic (10/62/6948), Renal insufficiency, Sciatica, Sciatica (12/11/2012), Skin cancer (08/2018), Sleep apnea, and Unilateral small kidney without contralateral hypertrophy. She also  has a past surgical history that includes Cesarean section (1980); Dilation and curettage of uterus; Eye surgery (1986); Abdominal hysterectomy; Tonsillectomy; Esophagogastroduodenoscopy (02/08/2014); Colonoscopy (05/04/2001); Esophagogastroduodenoscopy (egd) with propofol (N/A, 09/20/2016); Colonoscopy with propofol (N/A, 09/20/2016); Mohs surgery (11/2016); Cholecystectomy; Hernia repair; Joint replacement (Left, 09/24/2013); Back surgery (04/1999); Pulse generator implant (N/A, 11/02/2017); and Esophagogastroduodenoscopy (egd) with propofol (N/A, 05/23/2018). Brittney [name] has a current medication list which includes the following prescription(s): albuterol, alprazolam, bumetanide, butalbital-acetaminophen-caffeine, candesartan, carvedilol, clonidine, cyclobenzaprine, diclofenac sodium, doxazosin, escitalopram, fluticasone, gabapentin, hydralazine, ibuprofen, insulin detemir, insulin pen needle, insulin regular human concentrated, ketoconazole, levothyroxine, lubiprostone, metoclopramide, oxycodone-acetaminophen, pantoprazole, potassium  chloride, pramipexole, tramadol, trazodone, zolpidem, fluticasone, lactulose, lidocaine, sucralfate, and tramadol. She  reports that she has never smoked. She has never used smokeless tobacco. She reports that she does not drink alcohol or use drugs. [name] is allergic to eggs or egg-derived products; shellfish allergy; amlodipine; codeine; imdur [isosorbide dinitrate]; lyrica [pregabalin]; monosodium glutamate; and tape.   HPI  I last communicated with her on 03/28/2019. Today, she is being contacted for medication management.  Patient states that she was sick with URI and respiratory sx earlier this month. Was  tested for COVID 19 and was negative. Did sustain fall this past Sunday in her shower.  Patient has been utilizing her Oxycodone 1-2 tablets daily, still has Rx remaining written on 03/14/2019 that she can pick up.  Pharmacotherapy Assessment   03/26/2019  1   01/03/2019  Oxycodone-Acetaminophen 10-325  60.00 30 Bi Lat   [id number]   [place] (1409)   0  30.00 MME  Medicare   [place]   04/24/2019  1   02/09/2019  Tramadol Hcl 50 MG Tablet  180.00 30 Ma Mil   [id number]   [place] (1409)   2  30.00 MME  Medicare   [place]      Monitoring: Pharmacotherapy: No side-effects or adverse reactions reported. [place] PMP: PDMP reviewed during this encounter.       Compliance: No problems identified. Effectiveness: Clinically acceptable. Plan: Refer to "POC".  Review of recent tests   Pertinent Labs  Renal Function Lab Results  Component Value Date   BUN 14 10/28/2017   CREATININE 0.60 10/28/2017   GFRAA >60 10/28/2017   GFRNONAA >60 10/28/2017   Hepatic Function Lab Results  Component Value Date   AST 29 10/28/2017   ALT 24 10/28/2017   ALBUMIN 3.4 (L) 10/28/2017   UDS Summary  Date Value Ref Range Status  01/03/2019 FINAL  Final    Comment:    ==================================================================== TOXASSURE SELECT 13  (MW) ==================================================================== Test                             Result       Flag       Units Drug Present and Declared for Prescription Verification   Alprazolam                     165          EXPECTED   ng/mg creat   Alpha-hydroxyalprazolam        412          EXPECTED   ng/mg creat    Source of alprazolam is a scheduled prescription medication.    Alpha-hydroxyalprazolam is an expected metabolite of alprazolam.   Oxycodone                      812          EXPECTED   ng/mg creat   Oxymorphone                    932          EXPECTED   ng/mg creat   Noroxycodone                   1294         EXPECTED   ng/mg creat   Noroxymorphone                 332          EXPECTED   ng/mg creat    Sources of oxycodone are scheduled prescription medications.    Oxymorphone, noroxycodone, and noroxymorphone are expected    metabolites of oxycodone. Oxymorphone is also available as a    scheduled prescription medication.   Tramadol                       25 65         EXPECTED   ng/mg creat  O-Desmethyltramadol            7935         EXPECTED   ng/mg creat   N-Desmethyltramadol            2141         EXPECTED   ng/mg creat    Source of tramadol is a prescription medication.    O-desmethyltramadol and N-desmethyltramadol are expected    metabolites of tramadol.   Butalbital                     PRESENT      EXPECTED ==================================================================== Test                      Result    Flag   Units      Ref Range   Creatinine              34               mg/dL      >=20 ==================================================================== Declared Medications:  The flagging and interpretation on this report are based on the  following declared medications.  Unexpected results may arise from  inaccuracies in the declared medications.  **Note: The testing scope of this panel includes these medications:  Alprazolam   Butalbital (Butalbital/APAP/Caffeine)  Oxycodone (Percocet)  Tramadol (Ultram)  **Note: The testing scope of this panel does not include following  reported medications:  Acetaminophen (Butalbital/APAP/Caffeine)  Acetaminophen (Percocet)  Albuterol  Bumetanide  Caffeine (Butalbital/APAP/Caffeine)  Candesartan  Carvedilol  Clonidine  Cyclobenzaprine (Flexeril)  Doxazosin (Cardura)  Escitalopram (Lexapro)  Fluticasone (Flonase)  Fluticasone (Flovent)  Gabapentin (Neurontin)  Hydralazine (Apresoline)  Ibuprofen (Advil)  Insulin (Humulin)  Insulin (Levemir)  Lactulose  Levothyroxine  Lubiprostone (Amitiza)  Metoclopramide (Reglan)  Pantoprazole (Protonix)  Potassium  Pramipexole (Mirapex)  Sucralfate (Carafate)  Topical Diclofenac (Voltaren)  Trazodone (Desyrel)  Zolpidem (Ambien) ==================================================================== For clinical consultation, please call [phone]. ====================================================================    Note: Above Lab results reviewed. CT ABDOMEN PELVIS W CONTRAST CLINICAL DATA:  Right lower quadrant pain.  EXAM: CT ABDOMEN AND PELVIS WITH CONTRAST  TECHNIQUE: Multidetector CT imaging of the abdomen and pelvis was performed using the standard protocol following bolus administration of intravenous contrast.  CONTRAST:  119mL OMNIPAQUE IOHEXOL 300 MG/ML  SOLN  COMPARISON:  05/25/2017  FINDINGS: Lower chest: 4 mm left lower lobe nodule visible on 06/04 stable since prior and compatible with benign etiology.  Hepatobiliary: Nodular liver contour is compatible with cirrhosis. No focal mass lesion noted within the liver parenchyma. Gallbladder is surgically absent. No intrahepatic or extrahepatic biliary dilation.  Pancreas: No focal mass lesion. No dilatation of the main duct. No intraparenchymal cyst. No peripancreatic edema.  Spleen: No splenomegaly. No focal mass  lesion.  Adrenals/Urinary Tract: No adrenal nodule or mass. Cortical scarring noted in the kidneys bilaterally No evidence for hydroureter. The urinary bladder appears normal for the degree of distention.  Stomach/Bowel: Stomach is unremarkable. No gastric wall thickening. No evidence of outlet obstruction. Duodenum is normally positioned as is the ligament of Treitz. No small bowel wall thickening. No small bowel dilatation. The terminal ileum is normal. The appendix is not visualized, but there is no edema or inflammation in the region of the cecum. No gross colonic mass. No colonic wall thickening. Diverticular changes are noted in the left colon without evidence of diverticulitis.  Vascular/Lymphatic: There is abdominal aortic atherosclerosis without aneurysm. Portal vein, SMV, and  splenic vein are patent. 15 mm short axis portal caval node evident on 30/2. Upper normal to borderline enlarged lymph nodes are seen elsewhere in the hepato duodenal and gastrohepatic ligaments. No retroperitoneal lymphadenopathy. No pelvic sidewall lymphadenopathy.  Reproductive: Unremarkable.  Other: No intraperitoneal free fluid.  Musculoskeletal: Thoracic spinal stimulator device evident. Patient is status post lumbar fusion. No worrisome lytic or sclerotic osseous abnormality.  IMPRESSION: 1. No acute findings in the abdomen or pelvis. 2. Cirrhotic changes in the liver without focal hepatic mass lesion evident on this CT scan. Portal vein is patent. 3. Mild lymphadenopathy in the hepato duodenal ligament, likely related to the underlying hepatic disease. 4.  Aortic Atherosclerois (ICD10-170.0)  Electronically Signed   By: Misty Stanley M.D.   On: 04/13/2019 18:59  Assessment  The primary encounter diagnosis was Myofascial pain. Diagnoses of L3-4 severe lumbar facet hypertrophy and spinal stenosis, Failed back surgical syndrome, L3-4 severe lumbar spinal stenosis (12/31/2016 MRI), Spinal  stenosis of thoracic region, Chronic pain syndrome, Status post lumbar spine surgery for decompression of spinal cord, Spinal cord stimulator status ([place]), Neurogenic pain, and Fibromyalgia were also pertinent to this visit.  Plan of Care  I am having Brittney [name] "Pam" maintain her ALPRAZolam, diclofenac sodium, doxazosin, escitalopram, hydrALAZINE, metoCLOPramide, pantoprazole, potassium chloride, traZODone, zolpidem, insulin regular human CONCENTRATED, albuterol, butalbital-acetaminophen-caffeine, fluticasone, Insulin Detemir, Insulin Pen Needle, pramipexole, lidocaine, gabapentin, traMADol, bumetanide, carvedilol, ketoconazole, cloNIDine, levothyroxine, cyclobenzaprine, lactulose, sucralfate, traMADol, ibuprofen, lubiprostone, candesartan, and oxyCODONE-acetaminophen.  Patient states that she was sick with URI and respiratory sx earlier this month. Was tested for COVID 19 and was negative. Did sustain fall this past Sunday in her shower.  Patient has been utilizing her Oxycodone 1-2 tablets daily, still has Rx remaining written on 03/14/2019 that she can pick up.  Continue all other multimodal analgesics as prescribed including gabapentin, tramadol (prescribed by PCP), ibuprofen as needed.  Strongly encourage patient to decrease her Xanax intake which is been prescribed by her PCP as well as her Ambien intake.  I explained to the patient that these medications increase her risk of respiratory depression especially in the context of her morbid obesity and comorbidities including type 2 diabetes and heart disease.  Patient endorsed understanding.  Follow-up plan:   Return in about 6 weeks (around 07/03/2019) for Medication Management.    I discussed the assessment and treatment plan with the patient. The patient was provided an opportunity to ask questions and all were answered. The patient agreed with the plan and demonstrated an understanding of the instructions.  Patient advised to  call back or seek an in-person evaluation if the symptoms or condition worsens.  Total duration of non-face-to-face encounter: 29minutes.  Note by: Brittney Santa, MD Date: 05/22/2019; Time: 11:57 AM  Note: This dictation was prepared with Dragon dictation. Any transcriptional errors that may result from this process are unintentional.  Disclaimer:  * Given the special circumstances of the COVID-19 pandemic, the federal government has announced that the Office for Civil Rights (OCR) will exercise its enforcement discretion and will not impose penalties on physicians using telehealth in the event of noncompliance with regulatory requirements under the [place] (HIPAA) in connection with the good faith provision of telehealth during the OFHQR-97 national public health emergency. ([place])

## 2019-07-02 ENCOUNTER — Telehealth: Payer: Self-pay

## 2019-07-02 NOTE — Telephone Encounter (Signed)
Called patient about tomorrrow visit and she needs to reschedule.

## 2019-07-03 ENCOUNTER — Other Ambulatory Visit: Payer: Self-pay | Admitting: Gastroenterology

## 2019-07-03 ENCOUNTER — Encounter: Payer: Medicare Other | Admitting: Student in an Organized Health Care Education/Training Program

## 2019-07-03 DIAGNOSIS — E1142 Type 2 diabetes mellitus with diabetic polyneuropathy: Secondary | ICD-10-CM

## 2019-07-03 DIAGNOSIS — Z794 Long term (current) use of insulin: Secondary | ICD-10-CM

## 2019-07-03 DIAGNOSIS — E11649 Type 2 diabetes mellitus with hypoglycemia without coma: Secondary | ICD-10-CM

## 2019-07-03 DIAGNOSIS — R140 Abdominal distension (gaseous): Secondary | ICD-10-CM

## 2019-07-06 ENCOUNTER — Other Ambulatory Visit: Payer: Self-pay

## 2019-07-06 ENCOUNTER — Ambulatory Visit
Admission: RE | Admit: 2019-07-06 | Discharge: 2019-07-06 | Disposition: A | Discharge status: Home / Self Care | Payer: Medicare Other | Source: Ambulatory Visit | Attending: Gastroenterology | Admitting: Gastroenterology

## 2019-07-06 DIAGNOSIS — R140 Abdominal distension (gaseous): Secondary | ICD-10-CM | POA: Insufficient documentation

## 2019-07-10 ENCOUNTER — Other Ambulatory Visit: Payer: Self-pay

## 2019-07-10 ENCOUNTER — Encounter: Payer: Self-pay | Admitting: Student in an Organized Health Care Education/Training Program

## 2019-07-10 ENCOUNTER — Ambulatory Visit
Payer: Medicare Other | Attending: Student in an Organized Health Care Education/Training Program | Admitting: Student in an Organized Health Care Education/Training Program

## 2019-07-10 DIAGNOSIS — M7918 Myalgia, other site: Secondary | ICD-10-CM

## 2019-07-10 DIAGNOSIS — Z9689 Presence of other specified functional implants: Secondary | ICD-10-CM

## 2019-07-10 DIAGNOSIS — G894 Chronic pain syndrome: Secondary | ICD-10-CM

## 2019-07-10 DIAGNOSIS — M1711 Unilateral primary osteoarthritis, right knee: Secondary | ICD-10-CM | POA: Diagnosis not present

## 2019-07-10 DIAGNOSIS — M47816 Spondylosis without myelopathy or radiculopathy, lumbar region: Secondary | ICD-10-CM | POA: Diagnosis not present

## 2019-07-10 DIAGNOSIS — Z9889 Other specified postprocedural states: Secondary | ICD-10-CM

## 2019-07-10 DIAGNOSIS — M48061 Spinal stenosis, lumbar region without neurogenic claudication: Secondary | ICD-10-CM

## 2019-07-10 DIAGNOSIS — M797 Fibromyalgia: Secondary | ICD-10-CM

## 2019-07-10 DIAGNOSIS — M5416 Radiculopathy, lumbar region: Secondary | ICD-10-CM

## 2019-07-10 DIAGNOSIS — M4804 Spinal stenosis, thoracic region: Secondary | ICD-10-CM

## 2019-07-10 DIAGNOSIS — M961 Postlaminectomy syndrome, not elsewhere classified: Secondary | ICD-10-CM | POA: Diagnosis not present

## 2019-07-10 MED ORDER — OXYCODONE-ACETAMINOPHEN 10-325 MG PO TABS: 1.0000 | ORAL_TABLET | Freq: Two times a day (BID) | ORAL | 0 refills | Status: DC | PRN

## 2019-07-10 MED ORDER — OXYCODONE-ACETAMINOPHEN 10-325 MG PO TABS: 1.0000 | ORAL_TABLET | Freq: Two times a day (BID) | ORAL | 0 refills | Status: AC | PRN

## 2019-07-10 NOTE — Progress Notes (Signed)
Pain Management Virtual Encounter Note - Virtual Visit via Telephone Telehealth (real-time audio visits between healthcare provider and patient).   Patient's Phone No. & Preferred Pharmacy:  [phone] (home); [phone] (mobile); (Preferred) [phone] [email or web address]  [id number] - [place], [place] - [address] [place] [address] Phone: [phone] Fax: [phone]    Pre-screening note:  Our staff contacted Brittney [name] and offered her an "in person", "face-to-face" appointment versus a telephone encounter. She indicated preferring the telephone encounter, at this time.   Reason for Virtual Visit: COVID-19*  Social distancing based on CDC and AMA recommendations.   I contacted Brittney [name] on 07/10/2019 via telephone.      I clearly identified myself as [name], MD. I verified that I was speaking with the correct person using two identifiers (Name: Brittney [name], and date of birth: 16-May-1951).  Advanced Informed Consent I sought verbal advanced consent from Brittney [name] for virtual visit interactions. I informed Brittney [name] of possible security and privacy concerns, risks, and limitations associated with providing "not-in-person" medical evaluation and management services. I also informed Brittney [name] of the availability of "in-person" appointments. Finally, I informed her that there would be a charge for the virtual visit and that she could be  personally, fully or partially, financially responsible for it. Brittney [name] expressed understanding and agreed to proceed.   Historic Elements   Brittney [name] is a 68 y.o. year old, female patient evaluated today after her last encounter by our practice on 07/02/2019. Brittney [name]  has a past medical history of Abdominal pain, left upper quadrant (12/11/2012), Arthritis, Asthma, Broken leg (2014), Cancer ([place]) (2007), Chronic kidney disease, stage 2 (mild), Chronic lower back pain, Collagen  vascular disease ([place]), Coronary artery disease, Diabetes mellitus without complication ([place]), Diabetic nephropathy associated with secondary diabetes mellitus ([place]), Flushing (12/11/2012), Gastroparesis, Gross hematuria (12/11/2012), Hepatic cirrhosis ([place]), Hyperlipidemia, Hypertension, Hypothyroidism, Hypothyroidism, IBS (irritable bowel syndrome), Kidney stone (12/11/2012), Lower extremity edema, Morbid obesity with BMI of 40.0-44.9, adult ([place]), Myocardial infarction ([place]) (09/2013), Nausea without vomiting (12/11/2012), Nephrolithiasis, Nephrolithiasis (04/26/2014), Nonproliferative retinopathy due to secondary diabetes ([place]), Numbness and tingling of right leg, Peripheral vascular disease ([place]), Renal colic (50/35/4656), Renal insufficiency, Sciatica, Sciatica (12/11/2012), Skin cancer (08/2018), Sleep apnea, and Unilateral small kidney without contralateral hypertrophy. She also  has a past surgical history that includes Cesarean section (1980); Dilation and curettage of uterus; Eye surgery (1986); Abdominal hysterectomy; Tonsillectomy; Esophagogastroduodenoscopy (02/08/2014); Colonoscopy (05/04/2001); Esophagogastroduodenoscopy (egd) with propofol (N/A, 09/20/2016); Colonoscopy with propofol (N/A, 09/20/2016); Mohs surgery (11/2016); Cholecystectomy; Hernia repair; Joint replacement (Left, 09/24/2013); Back surgery (04/1999); Pulse generator implant (N/A, 11/02/2017); and Esophagogastroduodenoscopy (egd) with propofol (N/A, 05/23/2018). Brittney [name] has a current medication list which includes the following prescription(s): albuterol, alprazolam, bumetanide, butalbital-acetaminophen-caffeine, candesartan, carvedilol, clonidine, cyclobenzaprine, diclofenac sodium, doxazosin, escitalopram, fluticasone, fluticasone, gabapentin, hydralazine, ibuprofen, insulin detemir, insulin pen needle, insulin regular human concentrated, ketoconazole, lactulose, levothyroxine, lidocaine, lubiprostone, metoclopramide,  oxycodone-acetaminophen, oxycodone-acetaminophen, pantoprazole, potassium chloride, pramipexole, sucralfate, tramadol, tramadol, trazodone, and zolpidem. She  reports that she has never smoked. She has never used smokeless tobacco. She reports that she does not drink alcohol or use drugs. Brittney [name] is allergic to eggs or egg-derived products; shellfish allergy; amlodipine; codeine; imdur [isosorbide dinitrate]; lyrica [pregabalin]; monosodium glutamate; and tape.   HPI  Today, she is being contacted for worsening of previously known (established) problem and medication management.  Patient follows up today for medication management as well as worsening of right  knee pain.  Patient states that she sustained a fall day before Labor Day as she was showering.  This resulted in significant increase in her low back pain and knee pain.  Patient has been evaluated by Dr. Marry Guan and was not deemed a surgical candidate for knee replacement surgery given her weight.  Dr. Marry Guan recommended she discuss genicular nerve block and possible genicular nerve radiofrequency ablation with me.  In regards to medication management, I am only managing the patient's oxycodone which she takes as needed for breakthrough pain.  Patient's primary care provider is managing her tramadol, Xanax, Ambien which I have counseled her on many times especially the context of her morbid obesity and obstructive sleep apnea.  Pharmacotherapy Assessment  Analgesic:  05/22/2019  1   03/14/2019  Oxycodone-Acetaminophen 10-325  60.00 30 Bi Lat   [id number]   Nor (1409)   0  30.00 MME  Medicare   [place]     Monitoring: Pharmacotherapy: No side-effects or adverse reactions reported. [place] PMP: PDMP reviewed during this encounter.       Compliance: No problems identified. Effectiveness: Clinically acceptable. Plan: Refer to "POC".  Pertinent Labs   SAFETY SCREENING Profile Lab Results  Component Value Date   STAPHAUREUS NEGATIVE 10/28/2017    MRSAPCR NEGATIVE 10/28/2017   Renal Function Lab Results  Component Value Date   BUN 14 10/28/2017   CREATININE 0.60 10/28/2017   GFRAA >60 10/28/2017   GFRNONAA >60 10/28/2017   Hepatic Function Lab Results  Component Value Date   AST 29 10/28/2017   ALT 24 10/28/2017   ALBUMIN 3.4 (L) 10/28/2017   UDS Summary  Date Value Ref Range Status  01/03/2019 FINAL  Final    Comment:    ==================================================================== TOXASSURE SELECT 13 (MW) ==================================================================== Test                             Result       Flag       Units Drug Present and Declared for Prescription Verification   Alprazolam                     165          EXPECTED   ng/mg creat   Alpha-hydroxyalprazolam        412          EXPECTED   ng/mg creat    Source of alprazolam is a scheduled prescription medication.    Alpha-hydroxyalprazolam is an expected metabolite of alprazolam.   Oxycodone                      812          EXPECTED   ng/mg creat   Oxymorphone                    932          EXPECTED   ng/mg creat   Noroxycodone                   1294         EXPECTED   ng/mg creat   Noroxymorphone                 332          EXPECTED   ng/mg creat    Sources of oxycodone are scheduled prescription medications.    Oxymorphone, noroxycodone,  and noroxymorphone are expected    metabolites of oxycodone. Oxymorphone is also available as a    scheduled prescription medication.   Tramadol                       2565         EXPECTED   ng/mg creat   O-Desmethyltramadol            7935         EXPECTED   ng/mg creat   N-Desmethyltramadol            2141         EXPECTED   ng/mg creat    Source of tramadol is a prescription medication.    O-desmethyltramadol and N-desmethyltramadol are expected    metabolites of tramadol.   Butalbital                     PRESENT       EXPECTED ==================================================================== Test                      Result    Flag   Units      Ref Range   Creatinine              34               mg/dL      >=20 ==================================================================== Declared Medications:  The flagging and interpretation on this report are based on the  following declared medications.  Unexpected results may arise from  inaccuracies in the declared medications.  **Note: The testing scope of this panel includes these medications:  Alprazolam  Butalbital (Butalbital/APAP/Caffeine)  Oxycodone (Percocet)  Tramadol (Ultram)  **Note: The testing scope of this panel does not include following  reported medications:  Acetaminophen (Butalbital/APAP/Caffeine)  Acetaminophen (Percocet)  Albuterol  Bumetanide  Caffeine (Butalbital/APAP/Caffeine)  Candesartan  Carvedilol  Clonidine  Cyclobenzaprine (Flexeril)  Doxazosin (Cardura)  Escitalopram (Lexapro)  Fluticasone (Flonase)  Fluticasone (Flovent)  Gabapentin (Neurontin)  Hydralazine (Apresoline)  Ibuprofen (Advil)  Insulin (Humulin)  Insulin (Levemir)  Lactulose  Levothyroxine  Lubiprostone (Amitiza)  Metoclopramide (Reglan)  Pantoprazole (Protonix)  Potassium  Pramipexole (Mirapex)  Sucralfate (Carafate)  Topical Diclofenac (Voltaren)  Trazodone (Desyrel)  Zolpidem (Ambien) ==================================================================== For clinical consultation, please call [phone]. ====================================================================    Note: Above Lab results reviewed.  Recent imaging  US Abdomen Limited CLINICAL DATA:  Abdominal distension.  EXAM: ULTRASOUND ABDOMEN LIMITED  COMPARISON:  None.  FINDINGS: No ascites.  IMPRESSION: No ascites.  Electronically Signed   By: Dorise Bullion III M.D   On: 07/06/2019 17:29  Assessment  The primary encounter diagnosis was Primary  osteoarthritis of right knee. Diagnoses of Myofascial pain, L3-4 severe lumbar facet hypertrophy and spinal stenosis, Failed back surgical syndrome, L3-4 severe lumbar spinal stenosis (12/31/2016 MRI), Spinal stenosis of thoracic region, Fibromyalgia, Spinal cord stimulator status ([place]), Status post lumbar spine surgery for decompression of spinal cord, and Chronic pain syndrome were also pertinent to this visit.  Plan of Care  I am having Brittney [name] "Pam" start on oxyCODONE-acetaminophen and oxyCODONE-acetaminophen. I am also having her maintain her ALPRAZolam, diclofenac sodium, doxazosin, escitalopram, hydrALAZINE, metoCLOPramide, pantoprazole, potassium chloride, traZODone, zolpidem, insulin regular human CONCENTRATED, albuterol, butalbital-acetaminophen-caffeine, fluticasone, Insulin Detemir, Insulin Pen Needle, pramipexole, lidocaine, gabapentin, traMADol, bumetanide, carvedilol, ketoconazole, cloNIDine, levothyroxine, cyclobenzaprine, lactulose, sucralfate, traMADol, ibuprofen, lubiprostone, candesartan, and fluticasone.  Pharmacotherapy (Medications Ordered): Meds ordered this  encounter  Medications  . oxyCODONE-acetaminophen (PERCOCET) 10-325 MG tablet    Sig: Take 1 tablet by mouth 2 (two) times daily as needed for pain. Must last 30 days.    Dispense:  60 tablet    Refill:  0    Chronic Pain. (STOP Act - Not applicable). Fill one day early if closed on scheduled refill date.  Marland Kitchen oxyCODONE-acetaminophen (PERCOCET) 10-325 MG tablet    Sig: Take 1 tablet by mouth 2 (two) times daily as needed for pain. Must last 30 days.    Dispense:  60 tablet    Refill:  0    Chronic Pain. (STOP Act - Not applicable). Fill one day early if closed on scheduled refill date.   Orders:  Orders Placed This Encounter  Procedures  . GENICULAR NERVE BLOCK    Indication(s):  Sub-acute knee pain    Standing Status:   Future    Standing Expiration Date:   08/09/2019    Scheduling Instructions:      Side: RIGHT     Sedation: Without Sedation.     Timeframe: As soon as schedule allows    Order Specific Question:   Where will this procedure be performed?    Answer:   [facility]   Follow-up plan:   Return for Procedure right knee genicular nerve block without sedation.    Recent Visits Date Type Provider Dept  05/22/19 Office Visit [name], MD [facility]  Showing recent visits within past 90 days and meeting all other requirements   Today's Visits Date Type Provider Dept  07/10/19 Office Visit [name], MD [facility]  Showing today's visits and meeting all other requirements   Future Appointments No visits were found meeting these conditions.  Showing future appointments within next 90 days and meeting all other requirements   I discussed the assessment and treatment plan with the patient. The patient was provided an opportunity to ask questions and all were answered. The patient agreed with the plan and demonstrated an understanding of the instructions.  Patient advised to call back or seek an in-person evaluation if the symptoms or condition worsens.  Total duration of non-face-to-face encounter: 25 minutes.  Note by: [name], MD Date: 07/10/2019; Time: 2:56 PM  Note: This dictation was prepared with Dragon dictation. Any transcriptional errors that may result from this process are unintentional.  Disclaimer:  * Given the special circumstances of the COVID-19 pandemic, the federal government has announced that the Office for Civil Rights (OCR) will exercise its enforcement discretion and will not impose penalties on physicians using telehealth in the event of noncompliance with regulatory requirements under the [place] (HIPAA) in connection with the good faith provision of telehealth during the PPJKD-32 national public health emergency. ([place])

## 2019-07-13 ENCOUNTER — Other Ambulatory Visit: Admission: RE | Admit: 2019-07-13 | Payer: Medicare Other | Source: Ambulatory Visit

## 2019-07-18 ENCOUNTER — Ambulatory Visit
Admission: RE | Admit: 2019-07-18 | Discharge: 2019-07-18 | Disposition: A | Discharge status: Home / Self Care | Payer: Medicare Other | Source: Ambulatory Visit | Attending: Student in an Organized Health Care Education/Training Program | Admitting: Student in an Organized Health Care Education/Training Program

## 2019-07-18 ENCOUNTER — Ambulatory Visit (HOSPITAL_BASED_OUTPATIENT_CLINIC_OR_DEPARTMENT_OTHER): Payer: Medicare Other | Admitting: Student in an Organized Health Care Education/Training Program

## 2019-07-18 ENCOUNTER — Encounter: Payer: Self-pay | Admitting: Student in an Organized Health Care Education/Training Program

## 2019-07-18 ENCOUNTER — Other Ambulatory Visit: Payer: Self-pay

## 2019-07-18 VITALS — BP 160/72 | HR 66 | Temp 98.2°F | Resp 15 | Ht 66.0 in | Wt 313.0 lb

## 2019-07-18 DIAGNOSIS — M1711 Unilateral primary osteoarthritis, right knee: Secondary | ICD-10-CM

## 2019-07-18 MED ORDER — DEXAMETHASONE SODIUM PHOSPHATE 10 MG/ML IJ SOLN
10.0000 mg | Freq: Once | INTRAMUSCULAR | Status: AC
  Administered 2019-07-18: 12:00:00 10 mg

## 2019-07-18 MED ORDER — ROPIVACAINE HCL 2 MG/ML IJ SOLN
INTRAMUSCULAR | Status: AC
  Filled 2019-07-18: qty 10

## 2019-07-18 MED ORDER — ROPIVACAINE HCL 2 MG/ML IJ SOLN
1.0000 mL | Freq: Once | INTRAMUSCULAR | Status: AC
  Administered 2019-07-18: 10 mL via EPIDURAL

## 2019-07-18 MED ORDER — DEXAMETHASONE SODIUM PHOSPHATE 10 MG/ML IJ SOLN
INTRAMUSCULAR | Status: AC
  Filled 2019-07-18: qty 1

## 2019-07-18 MED ORDER — LIDOCAINE HCL 2 % IJ SOLN
INTRAMUSCULAR | Status: AC
  Filled 2019-07-18: qty 20

## 2019-07-18 MED ORDER — LIDOCAINE HCL 2 % IJ SOLN
20.0000 mL | Freq: Once | INTRAMUSCULAR | Status: AC
  Administered 2019-07-18: 400 mg

## 2019-07-18 NOTE — Progress Notes (Signed)
Patient's Name: Brittney [name]  MRN: [id number]  Referring Provider: Rusty Aus, MD  DOB: 03-05-1951  PCP: Rusty Aus, MD  DOS: 07/18/2019  Note by: [name], MD  Service setting: Ambulatory outpatient  Specialty: Interventional Pain Management  Patient type: Established  Location: [facility] (AMB) Pain Management Facility  Visit type: Interventional Procedure   Primary Reason for Visit: Interventional Pain Management Treatment. CC: Knee Pain (right )  Procedure:          Anesthesia, Analgesia, Anxiolysis:  Type: Genicular Nerves Block (Superior-lateral, Superior-medial, and Inferior-medial Genicular Nerves) #1  CPT: 79892      Primary Purpose: Diagnostic Region: Lateral, Anterior, and Medial aspects of the knee joint, above and below the knee joint proper. Level: Superior and inferior to the knee joint. Target Area: For Genicular Nerve block(s), the targets are: the superior-lateral genicular nerve, located in the lateral distal portion of the femoral shaft as it curves to form the lateral epicondyle, in the region of the distal femoral metaphysis; the superior-medial genicular nerve, located in the medial distal portion of the femoral shaft as it curves to form the medial epicondyle; and the inferior-medial genicular nerve, located in the medial, proximal portion of the tibial shaft, as it curves to form the medial epicondyle, in the region of the proximal tibial metaphysis. Approach: Anterior, percutaneous, ipsilateral approach. Laterality: Right knee  Type: Local Anesthesia  Local Anesthetic: Lidocaine 1-2%  Position: Modified Fowler's position with pillows under the targeted knee(s).   Indications: 1. Primary osteoarthritis of right knee    Pain Score: Pre-procedure: 7 /10 Post-procedure: 3 /10   Pre-op Assessment:  Brittney [name] is a 68 y.o. (year old), female patient, seen today for interventional treatment. She  has a past surgical history that includes Cesarean section  (1980); Dilation and curettage of uterus; Eye surgery (1986); Abdominal hysterectomy; Tonsillectomy; Esophagogastroduodenoscopy (02/08/2014); Colonoscopy (05/04/2001); Esophagogastroduodenoscopy (egd) with propofol (N/A, 09/20/2016); Colonoscopy with propofol (N/A, 09/20/2016); Mohs surgery (11/2016); Cholecystectomy; Hernia repair; Joint replacement (Left, 09/24/2013); Back surgery (04/1999); Pulse generator implant (N/A, 11/02/2017); and Esophagogastroduodenoscopy (egd) with propofol (N/A, 05/23/2018). Brittney [name] has a current medication list which includes the following prescription(s): albuterol, alprazolam, bumetanide, butalbital-acetaminophen-caffeine, candesartan, carvedilol, clonidine, cyclobenzaprine, diclofenac sodium, doxazosin, escitalopram, fluticasone, fluticasone, gabapentin, hydralazine, ibuprofen, insulin detemir, insulin pen needle, insulin regular human concentrated, levothyroxine, linzess, metoclopramide, nitroglycerin, ondansetron, oxycodone-acetaminophen, oxycodone-acetaminophen, pantoprazole, potassium chloride, pramipexole, rosuvastatin, tramadol, trazodone, zolpidem, gabapentin, ketoconazole, lactulose, lidocaine, lubiprostone, sucralfate, and tramadol. Her primarily concern today is the Knee Pain (right )  Initial Vital Signs:  Pulse/HCG Rate: 66ECG Heart Rate: 61 Temp: 98.2 F (36.8 C) Resp: 16 BP: (!) 145/56 SpO2: 96 %  BMI: Estimated body mass index is 50.52 kg/m as calculated from the following:   Height as of this encounter: 5\' 6"  (1.676 m).   Weight as of this encounter: 313 lb (142 kg).  Risk Assessment: Allergies: Reviewed. She is allergic to eggs or egg-derived products; shellfish allergy; amlodipine; codeine; imdur [isosorbide dinitrate]; lyrica [pregabalin]; monosodium glutamate; and tape.  Allergy Precautions: None required Coagulopathies: Reviewed. None identified.  Blood-thinner therapy: None at this time Active Infection(s): Reviewed. None identified. Ms.  [name] is afebrile  Site Confirmation: Brittney [name] was asked to confirm the procedure and laterality before marking the site Procedure checklist: Completed Consent: Before the procedure and under the influence of no sedative(s), amnesic(s), or anxiolytics, the patient was informed of the treatment options, risks and possible complications. To fulfill our ethical and legal obligations, as recommended by the  American Medical Association's Code of Ethics, I have informed the patient of my clinical impression; the nature and purpose of the treatment or procedure; the risks, benefits, and possible complications of the intervention; the alternatives, including doing nothing; the risk(s) and benefit(s) of the alternative treatment(s) or procedure(s); and the risk(s) and benefit(s) of doing nothing. The patient was provided information about the general risks and possible complications associated with the procedure. These may include, but are not limited to: failure to achieve desired goals, infection, bleeding, organ or nerve damage, allergic reactions, paralysis, and death. In addition, the patient was informed of those risks and complications associated to the procedure, such as failure to decrease pain; infection; bleeding; organ or nerve damage with subsequent damage to sensory, motor, and/or autonomic systems, resulting in permanent pain, numbness, and/or weakness of one or several areas of the body; allergic reactions; (i.e.: anaphylactic reaction); and/or death. Furthermore, the patient was informed of those risks and complications associated with the medications. These include, but are not limited to: allergic reactions (i.e.: anaphylactic or anaphylactoid reaction(s)); adrenal axis suppression; blood sugar elevation that in diabetics may result in ketoacidosis or comma; water retention that in patients with history of congestive heart failure may result in shortness of breath, pulmonary edema, and  decompensation with resultant heart failure; weight gain; swelling or edema; medication-induced neural toxicity; particulate matter embolism and blood vessel occlusion with resultant organ, and/or nervous system infarction; and/or aseptic necrosis of one or more joints. Finally, the patient was informed that Medicine is not an exact science; therefore, there is also the possibility of unforeseen or unpredictable risks and/or possible complications that may result in a catastrophic outcome. The patient indicated having understood very clearly. We have given the patient no guarantees and we have made no promises. Enough time was given to the patient to ask questions, all of which were answered to the patient's satisfaction. Brittney [name] has indicated that she wanted to continue with the procedure. Attestation: I, the ordering provider, attest that I have discussed with the patient the benefits, risks, side-effects, alternatives, likelihood of achieving goals, and potential problems during recovery for the procedure that I have provided informed consent. Date   Time: 07/18/2019 10:48 AM  Pre-Procedure Preparation:  Monitoring: As per clinic protocol. Respiration, ETCO2, SpO2, BP, heart rate and rhythm monitor placed and checked for adequate function Safety Precautions: Patient was assessed for positional comfort and pressure points before starting the procedure. Time-out: I initiated and conducted the "Time-out" before starting the procedure, as per protocol. The patient was asked to participate by confirming the accuracy of the "Time Out" information. Verification of the correct person, site, and procedure were performed and confirmed by me, the nursing staff, and the patient. "Time-out" conducted as per Joint Commission's Universal Protocol (UP.01.01.01). Time: 1157  Description of Procedure:          Area Prepped: Entire knee area, from mid-thigh to mid-shin, lateral, anterior, and medial  aspects. Prepping solution: DuraPrep (Iodine Povacrylex [0.7% available iodine] and Isopropyl Alcohol, 74% w/w) Safety Precautions: Aspiration looking for blood return was conducted prior to all injections. At no point did we inject any substances, as a needle was being advanced. No attempts were made at seeking any paresthesias. Safe injection practices and needle disposal techniques used. Medications properly checked for expiration dates. SDV (single dose vial) medications used. Description of the Procedure: Protocol guidelines were followed. The patient was placed in position over the procedure table. The target area was identified and the  area prepped in the usual manner. Skin & deeper tissues infiltrated with local anesthetic. Appropriate amount of time allowed to pass for local anesthetics to take effect. The procedure needles were then advanced to the target area. Proper needle placement secured. Negative aspiration confirmed. Solution injected in intermittent fashion, asking for systemic symptoms every 0.5cc of injectate. The needles were then removed and the area cleansed, making sure to leave some of the prepping solution back to take advantage of its long term bactericidal properties.  Vitals:   07/18/19 1051 07/18/19 1201 07/18/19 1206 07/18/19 1209  BP: (!) 145/56 (!) 163/65 (!) 153/77 (!) 160/72  Pulse: 66     Resp: 16 11 17 15   Temp: 98.2 F (36.8 C)     TempSrc: Oral     SpO2: 96% 95% 95% 95%  Weight: (!) 313 lb (142 kg)     Height: 5\' 6"  (1.676 m)       Start Time: 1158 hrs. End Time: 1208 hrs. Materials:  Needle(s) Type: Spinal Needle Gauge: 25G Length: 3.5-in Medication(s): Please see orders for medications and dosing details. 5 cc solution made of 4 cc of 0.2% ropivacaine, 1 cc of Decadron 10 mg/cc.  1.5 cc injected at each level above for the right knee. Imaging Guidance (Non-Spinal):          Type of Imaging Technique: Fluoroscopy Guidance (Non-Spinal) Indication(s):  Assistance in needle guidance and placement for procedures requiring needle placement in or near specific anatomical locations not easily accessible without such assistance. Exposure Time: Please see nurses notes. Contrast: None used. Fluoroscopic Guidance: I was personally present during the use of fluoroscopy. "Tunnel Vision Technique" used to obtain the best possible view of the target area. Parallax error corrected before commencing the procedure. "Direction-depth-direction" technique used to introduce the needle under continuous pulsed fluoroscopy. Once target was reached, antero-posterior, oblique, and lateral fluoroscopic projection used confirm needle placement in all planes. Images permanently stored in EMR. Interpretation: No contrast injected. I personally interpreted the imaging intraoperatively. Adequate needle placement confirmed in multiple planes. Permanent images saved into the patient's record.  Antibiotic Prophylaxis:   Anti-infectives (From admission, onward)   None     Indication(s): None identified  Post-operative Assessment:  Post-procedure Vital Signs:  Pulse/HCG Rate: 6660 Temp: 98.2 F (36.8 C) Resp: 15 BP: (!) 160/72 SpO2: 95 %  EBL: None  Complications: No immediate post-treatment complications observed by team, or reported by patient.  Note: The patient tolerated the entire procedure well. A repeat set of vitals were taken after the procedure and the patient was kept under observation following institutional policy, for this type of procedure. Post-procedural neurological assessment was performed, showing return to baseline, prior to discharge. The patient was provided with post-procedure discharge instructions, including a section on how to identify potential problems. Should any problems arise concerning this procedure, the patient was given instructions to immediately contact us, at any time, without hesitation. In any case, we plan to contact the patient by  telephone for a follow-up status report regarding this interventional procedure.  Comments:  No additional relevant information.  Plan of Care  Orders:  Orders Placed This Encounter  Procedures   DG PAIN CLINIC C-ARM 1-60 MIN NO REPORT    Intraoperative interpretation by procedural physician at [place].    Standing Status:   Standing    Number of Occurrences:   1    Order Specific Question:   Reason for exam:    Answer:   Assistance  in needle guidance and placement for procedures requiring needle placement in or near specific anatomical locations not easily accessible without such assistance.   Medications ordered for procedure: Meds ordered this encounter  Medications   lidocaine (XYLOCAINE) 2 % (with pres) injection 400 mg   ropivacaine (PF) 2 mg/mL (0.2%) (NAROPIN) injection 1 mL   dexamethasone (DECADRON) injection 10 mg   Medications administered: We administered lidocaine, ropivacaine (PF) 2 mg/mL (0.2%), and dexamethasone.  See the medical record for exact dosing, route, and time of administration.  Follow-up plan:   Return in about 4 weeks (around 08/15/2019) for Post Procedure Evaluation, virtual.      s/p GNB (R) 07/18/19   Recent Visits Date Type Provider Dept  07/10/19 Office Visit [name], MD [facility]  05/22/19 Office Visit [name], MD [facility]  Showing recent visits within past 90 days and meeting all other requirements   Today's Visits Date Type Provider Dept  07/18/19 Procedure visit [name], MD [facility]  Showing today's visits and meeting all other requirements   Future Appointments Date Type Provider Dept  08/15/19 Appointment [name], MD [facility]  09/06/19 Appointment [name], MD [facility]  Showing future appointments within next 90 days and meeting all other requirements   Disposition: Discharge home  Discharge Date & Time: 07/18/2019; 1215  hrs.   Primary Care Physician: Rusty Aus, MD Location: [facility] Note by: [name], MD Date: 07/18/2019; Time: 2:39 PM  Disclaimer:  Medicine is not an exact science. The only guarantee in medicine is that nothing is guaranteed. It is important to note that the decision to proceed with this intervention was based on the information collected from the patient. The Data and conclusions were drawn from the patient's questionnaire, the interview, and the physical examination. Because the information was provided in large part by the patient, it cannot be guaranteed that it has not been purposely or unconsciously manipulated. Every effort has been made to obtain as much relevant data as possible for this evaluation. It is important to note that the conclusions that lead to this procedure are derived in large part from the available data. Always take into account that the treatment will also be dependent on availability of resources and existing treatment guidelines, considered by other Pain Management Practitioners as being common knowledge and practice, at the time of the intervention. For Medico-Legal purposes, it is also important to point out that variation in procedural techniques and pharmacological choices are the acceptable norm. The indications, contraindications, technique, and results of the above procedure should only be interpreted and judged by a Board-Certified Interventional Pain Specialist with extensive familiarity and expertise in the same exact procedure and technique.

## 2019-07-18 NOTE — Patient Instructions (Signed)

## 2019-07-18 NOTE — Progress Notes (Signed)
Safety precautions to be maintained throughout the outpatient stay will include: orient to surroundings, keep bed in low position, maintain call bell within reach at all times, provide assistance with transfer out of bed and ambulation.  

## 2019-07-19 ENCOUNTER — Telehealth: Payer: Self-pay

## 2019-07-19 NOTE — Telephone Encounter (Signed)
Called patient  States she is "wired" from the steroids and going to the bathroom a lot. Instructed to call if needed.

## 2019-07-22 NOTE — Progress Notes (Signed)
[place]  [phone] [phone]  ID: Brittney [name] OB: Aug 01, 1951  MR#: [id number]  LTR#:[id number]  Patient Care Team: Rusty Aus, MD as PCP - General (Internal Medicine)  CHIEF COMPLAINT: Iron deficiency anemia.  INTERVAL HISTORY: Patient returns to clinic today for repeat laboratory for further evaluation.  She currently feels well and is asymptomatic.  She does not complain of weakness or fatigue today.  She has no neurologic complaints. She denies any recent fevers or illnesses. She has a good appetite and denies weight loss.  She denies any chest pain, shortness of breath, cough, or hemoptysis.  She has no nausea, vomiting, constipation, or diarrhea. She denies any melena or hematochezia. She has no urinary complaints.  Patient feels at her baseline offers no specific complaints today.  REVIEW OF SYSTEMS:   Review of Systems  Constitutional: Negative for fever, malaise/fatigue and weight loss.  Respiratory: Negative.  Negative for cough, hemoptysis and shortness of breath.   Cardiovascular: Negative.  Negative for chest pain and leg swelling.  Gastrointestinal: Negative.  Negative for abdominal pain, blood in stool and melena.  Genitourinary: Negative.  Negative for dysuria.  Musculoskeletal: Negative.  Negative for back pain.  Skin: Negative.  Negative for rash.  Neurological: Negative.  Negative for sensory change, focal weakness, weakness and headaches.  Psychiatric/Behavioral: Negative.  The patient is not nervous/anxious.     As per HPI. Otherwise, a complete review of systems is negative.  PAST MEDICAL HISTORY: Past Medical History:  Diagnosis Date  . Abdominal pain, left upper quadrant 12/11/2012  . Arthritis   . Asthma   . Broken leg 2014   fell twice and broke same leg. had a bone stimulator for first break.  . Cancer ([place]) 2007   melanoma, left ear and back of left leg, removed from back  . Chronic kidney disease,  stage 2 (mild)   . Chronic lower back pain   . Collagen vascular disease ([place])   . Coronary artery disease   . Diabetes mellitus without complication ([place])   . Diabetic nephropathy associated with secondary diabetes mellitus ([place])   . [place] 12/11/2012  . Gastroparesis   . Gross hematuria 12/11/2012  . Hepatic cirrhosis ([place])   . Hyperlipidemia   . Hypertension   . Hypothyroidism   . Hypothyroidism   . IBS (irritable bowel syndrome)   . Kidney stone 12/11/2012  . Lower extremity edema   . Morbid obesity with BMI of 40.0-44.9, adult ([place])   . Myocardial infarction ([place]) 09/2013  . Nausea without vomiting 12/11/2012  . Nephrolithiasis   . Nephrolithiasis 04/26/2014  . Nonproliferative retinopathy due to secondary diabetes ([place])   . Numbness and tingling of right leg   . Peripheral vascular disease ([place])   . Renal colic 68/61/6837  . Renal insufficiency   . Sciatica   . Sciatica 12/11/2012  . Skin cancer 08/2018  . Sleep apnea    sleep study coming in november  . Unilateral small kidney without contralateral hypertrophy     PAST SURGICAL HISTORY: Past Surgical History:  Procedure Laterality Date  . ABDOMINAL HYSTERECTOMY    . BACK SURGERY  04/1999   L-4-5 LAMINECTOMY. metal in lower back and neck  . CESAREAN SECTION  1980  . CHOLECYSTECTOMY    . COLONOSCOPY  05/04/2001  . COLONOSCOPY WITH PROPOFOL N/A 09/20/2016   Procedure: COLONOSCOPY WITH PROPOFOL;  Surgeon: Lollie Sails, MD;  Location: [facility] ENDOSCOPY;  Service: Endoscopy;  Laterality: N/A;  .  DILATION AND CURETTAGE OF UTERUS    . ESOPHAGOGASTRODUODENOSCOPY  02/08/2014  . ESOPHAGOGASTRODUODENOSCOPY (EGD) WITH PROPOFOL N/A 09/20/2016   Procedure: ESOPHAGOGASTRODUODENOSCOPY (EGD) WITH PROPOFOL;  Surgeon: Lollie Sails, MD;  Location: [facility] ENDOSCOPY;  Service: Endoscopy;  Laterality: N/A;  . ESOPHAGOGASTRODUODENOSCOPY (EGD) WITH PROPOFOL N/A 05/23/2018   Procedure: ESOPHAGOGASTRODUODENOSCOPY (EGD) WITH PROPOFOL;   Surgeon: Lollie Sails, MD;  Location: [facility] ENDOSCOPY;  Service: Endoscopy;  Laterality: N/A;  . EYE SURGERY  1986   FOR MELANOMA  . HERNIA REPAIR    . JOINT REPLACEMENT Left 09/24/2013   TOTAL KNEE  . MOHS SURGERY  11/2016   left side of nose  . PULSE GENERATOR IMPLANT N/A 11/02/2017   Procedure: UNILATERAL PULSE GENERATOR IMPLANT;  Surgeon: Meade Maw, MD;  Location: [facility] ORS;  Service: Neurosurgery;  Laterality: N/A;  . TONSILLECTOMY     WITH ADENOIDECTOMY    FAMILY HISTORY: Family History  Problem Relation Age of Onset  . Heart disease Mother   . Cancer Mother   . Asthma Mother   . Diabetes Father   . Kidney disease Father   . Hypertension Father   . Breast cancer Neg Hx     ADVANCED DIRECTIVES (Y/N):  N  HEALTH MAINTENANCE: Social History   Tobacco Use  . Smoking status: Never Smoker  . Smokeless tobacco: Never Used  Substance Use Topics  . Alcohol use: No  . Drug use: No     Colonoscopy:  PAP:  Bone density:  Lipid panel:  Allergies  Allergen Reactions  . Eggs Or Egg-Derived Products Nausea And Vomiting    Only has reaction to flu vaccine due to eggs, no reaction to egg food products  . Shellfish Allergy Nausea And Vomiting, Diarrhea and Nausea Only    Non stop sickness once ingests shellfish. Betadine is okay  . Amlodipine Cough  . Codeine Nausea And Vomiting and Nausea Only    states she can take codeine cough syrup without problems states she can take codeine cough syrup without problems  . Imdur [Isosorbide Dinitrate] Other (See Comments)    Headache  . Lyrica [Pregabalin] Swelling and Other (See Comments)    "FLU-LIKE" SYMPTOMS  . Monosodium Glutamate Other (See Comments)    Headache  . Tape Other (See Comments) and Nausea And Vomiting    can use paper tape Redness    Current Outpatient Medications  Medication Sig Dispense Refill  . albuterol (VENTOLIN HFA) 108 (90 Base) MCG/ACT inhaler TAKE 2 PUFFS BY MOUTH 3 TIMES A DAY AS  NEEDED FOR SHORTNESS OF BREATH    . ALPRAZolam (XANAX) 0.5 MG tablet Take 0.5 mg by mouth 4 (four) times daily.     . bumetanide (BUMEX) 1 MG tablet Take by mouth daily.     . butalbital-acetaminophen-caffeine (FIORICET, ESGIC) 50-325-40 MG tablet TAKE ONE TABLET BY MOUTH ONCE DAILY    . candesartan (ATACAND) 32 MG tablet Take 1 tablet (32 mg total) by mouth daily. 30 tablet 0  . carvedilol (COREG) 25 MG tablet Take 37.5-50 mg by mouth 2 (two) times daily with a meal. TAKES 2 TABLETS IN THE MORNING AND 1.5 TABLETS IN THE EVENING    . cloNIDine (CATAPRES - DOSED IN MG/24 HR) 0.3 mg/24hr patch Place 0.3 mg onto the skin once a week.     . cyclobenzaprine (FLEXERIL) 10 MG tablet Take 20 mg by mouth at bedtime.    . diclofenac sodium (VOLTAREN) 1 % GEL Apply 4 g topically as needed (PAIN).     Marland Kitchen  doxazosin (CARDURA) 4 MG tablet Take 4 mg by mouth at bedtime.     Marland Kitchen escitalopram (LEXAPRO) 10 MG tablet Take 10 mg by mouth daily.     . fluticasone (FLONASE) 50 MCG/ACT nasal spray PLACE 2 SPRAYS INTO BOTH NOSTRILS 2 (TWO) TIMES DAILY AS NEEDED FOR ALLERGIES    . fluticasone (FLOVENT HFA) 220 MCG/ACT inhaler TAKE 2 PUFFS BY MOUTH TWICE A DAY 36 Inhaler 1  . gabapentin (NEURONTIN) 600 MG tablet Take 600 mg by mouth 3 (three) times daily.    . hydrALAZINE (APRESOLINE) 25 MG tablet Take 25 mg by mouth at bedtime.     Marland Kitchen ibuprofen (ADVIL,MOTRIN) 800 MG tablet Take 800 mg by mouth 3 (three) times daily as needed.     . Insulin Detemir (LEVEMIR FLEXTOUCH) 100 UNIT/ML Pen 2 (two) times daily. 70 units in the am 70 units in evening    . Insulin Pen Needle (FIFTY50 PEN NEEDLES) 32G X 6 MM MISC 5 (five) times daily.    . insulin regular human CONCENTRATED (HUMULIN R) 500 UNIT/ML kwikpen Inject 80 Units into the skin 3 (three) times daily with meals. PER SLIDING SCALE    . ketoconazole (NIZORAL) 2 % shampoo APPLY TO AFFECTED AREAS (LEAVE ON FOR 5 MINUTES) , RINSE WELL, USE 2-3 TIMES PER WEEK.  11  . lactulose  (CHRONULAC) 10 GM/15ML solution Take 10 g by mouth daily as needed for mild constipation.    Marland Kitchen levothyroxine (SYNTHROID, LEVOTHROID) 150 MCG tablet Take 150 mcg by mouth daily before breakfast.   11  . lidocaine (LIDODERM) 5 % PLACE 1 PATCH ONTO THE MOST PAINFUL AREA OF SKIN DAILY FOR UP TO 12 HOURS IN A 24 HOUR PERIOD  5  . LINZESS 290 MCG CAPS capsule Take 290 mcg by mouth daily.    Marland Kitchen lubiprostone (AMITIZA) 24 MCG capsule TAKE 1 CAPSULE (24 MCG TOTAL) BY MOUTH 2 (TWO) TIMES DAILY WITH MEALS.    Marland Kitchen metoCLOPramide (REGLAN) 10 MG tablet Take 10 mg by mouth 3 (three) times daily.    . nitroGLYCERIN (NITRODUR - DOSED IN MG/24 HR) 0.4 mg/hr patch Place 1 patch onto the skin as needed.    . ondansetron (ZOFRAN) 4 MG tablet Take 4 mg by mouth as needed.    Marland Kitchen oxyCODONE-acetaminophen (PERCOCET) 10-325 MG tablet Take 1 tablet by mouth 2 (two) times daily as needed for pain. Must last 30 days. 60 tablet 0  . [START ON 08/09/2019] oxyCODONE-acetaminophen (PERCOCET) 10-325 MG tablet Take 1 tablet by mouth 2 (two) times daily as needed for pain. Must last 30 days. 60 tablet 0  . pantoprazole (PROTONIX) 40 MG tablet TAKE 1 TABLET (40 MG TOTAL) BY MOUTH 2 (TWO) TIMES DAILY. TAKE ONE HOUR BEFORE A MEAL    . potassium chloride (K-DUR,KLOR-CON) 10 MEQ tablet TAKE 1 TABLET BY MOUTH ONCE A DAY    . pramipexole (MIRAPEX) 0.5 MG tablet TAKE 2 TABLETS BY MOUTH AT BEDTIME    . rosuvastatin (CRESTOR) 10 MG tablet Take 10 mg by mouth daily.    . sucralfate (CARAFATE) 1 g tablet Take 1 tablet by mouth daily.    . traMADol (ULTRAM) 50 MG tablet Take 50 mg by mouth 2 (two) times daily. 1 in am and 2 at bedtime  3  . traZODone (DESYREL) 150 MG tablet Take 150 mg by mouth at bedtime.     Marland Kitchen zolpidem (AMBIEN) 10 MG tablet Take 10 mg by mouth at bedtime.     . gabapentin (  NEURONTIN) 600 MG tablet Take 1 tablet (600 mg total) by mouth 6 (six) times daily. (Patient taking differently: Take 600 mg by mouth See admin instructions. ) 540  tablet 0  . traMADol (ULTRAM) 50 MG tablet Take 2 tablets (100 mg total) by mouth every 8 (eight) hours. (Patient not taking: Reported on 05/17/2019) 180 tablet 2   No current facility-administered medications for this visit.     OBJECTIVE: Vitals:   07/24/19 1328  BP: (!) 178/74  Pulse: 80  Temp: 98 F (36.7 C)     Body mass index is 52.12 kg/m.    ECOG FS:0 - Asymptomatic  General: Well-developed, well-nourished, no acute distress. Eyes: Pink conjunctiva, anicteric sclera. HEENT: Normocephalic, moist mucous membranes. Lungs: Clear to auscultation bilaterally. Heart: Regular rate and rhythm. No rubs, murmurs, or gallops. Abdomen: Soft, nontender, nondistended. No organomegaly noted, normoactive bowel sounds. Musculoskeletal: No edema, cyanosis, or clubbing. Neuro: Alert, answering all questions appropriately. Cranial nerves grossly intact. Skin: No rashes or petechiae noted. Psych: Normal affect.  LAB RESULTS:  Lab Results  Component Value Date   NA 137 10/28/2017   K 3.8 10/28/2017   CL 101 10/28/2017   CO2 28 10/28/2017   GLUCOSE 204 (H) 10/28/2017   BUN 14 10/28/2017   CREATININE 0.60 10/28/2017   CALCIUM 9.5 10/28/2017   PROT 7.0 10/28/2017   ALBUMIN 3.4 (L) 10/28/2017   AST 29 10/28/2017   ALT 24 10/28/2017   ALKPHOS 103 10/28/2017   BILITOT 0.6 10/28/2017   GFRNONAA >60 10/28/2017   GFRAA >60 10/28/2017    Lab Results  Component Value Date   WBC 7.7 07/24/2019   NEUTROABS 4.4 07/24/2019   HGB 10.8 (L) 07/24/2019   HCT 35.1 (L) 07/24/2019   MCV 85.8 07/24/2019   PLT 175 07/24/2019   Lab Results  Component Value Date   IRON 91 07/24/2019   TIBC 292 07/24/2019   IRONPCTSAT 31 07/24/2019   Lab Results  Component Value Date   FERRITIN 27 07/24/2019     STUDIES: US Abdomen Limited  Result Date: 07/06/2019 CLINICAL DATA:  Abdominal distension. EXAM: ULTRASOUND ABDOMEN LIMITED COMPARISON:  None. FINDINGS: No ascites. IMPRESSION: No ascites.  Electronically Signed   By: Dorise Bullion III M.D   On: 07/06/2019 17:29   [facility] C-arm 1-60 Min No Report  Result Date: 07/18/2019 Fluoro was used, but no Radiologist interpretation will be provided. Please refer to "NOTES" tab for provider progress note.   ASSESSMENT: Iron deficiency anemia.  PLAN:    1. Iron deficiency anemia: Patient's hemoglobin remains mildly decreased, but essentially stable at 10.8.  Her iron stores continue to be within normal limits.  Previously, the remainder of her laboratory work was also either negative or within normal limits.  Colonoscopy and EGD completed on September 20, 2016 were essentially normal other than some mild gastritis. She does not require additional Feraheme today. Patient last received treatment on July 13, 2017.  After discussion with the patient, it was decided that no further follow-up is necessary.  Please refer patient back if there are any questions or concerns. 2. Hypertension: Patient's blood pressure is moderately elevated today. Continue evaluation and treatment per primary care. 3. Back pain: Patient does not complain of this today.  Patient has a permanent spinal stimulator in place.  Patient expressed understanding and was in agreement with this plan. She also understands that She can call clinic at any time with any questions, concerns, or complaints.  Lloyd Huger, MD   07/26/2019 7:13 AM

## 2019-07-24 ENCOUNTER — Inpatient Hospital Stay: Payer: Medicare Other

## 2019-07-24 ENCOUNTER — Inpatient Hospital Stay: Payer: Medicare Other | Attending: Oncology

## 2019-07-24 ENCOUNTER — Other Ambulatory Visit: Payer: Self-pay

## 2019-07-24 ENCOUNTER — Inpatient Hospital Stay (HOSPITAL_BASED_OUTPATIENT_CLINIC_OR_DEPARTMENT_OTHER): Payer: Medicare Other | Admitting: Oncology

## 2019-07-24 VITALS — BP 178/74 | HR 80 | Temp 98.0°F | Wt 322.9 lb

## 2019-07-24 DIAGNOSIS — N182 Chronic kidney disease, stage 2 (mild): Secondary | ICD-10-CM

## 2019-07-24 DIAGNOSIS — I2510 Atherosclerotic heart disease of native coronary artery without angina pectoris: Secondary | ICD-10-CM | POA: Insufficient documentation

## 2019-07-24 DIAGNOSIS — E039 Hypothyroidism, unspecified: Secondary | ICD-10-CM | POA: Diagnosis not present

## 2019-07-24 DIAGNOSIS — D509 Iron deficiency anemia, unspecified: Secondary | ICD-10-CM

## 2019-07-24 DIAGNOSIS — I129 Hypertensive chronic kidney disease with stage 1 through stage 4 chronic kidney disease, or unspecified chronic kidney disease: Secondary | ICD-10-CM

## 2019-07-24 DIAGNOSIS — E1122 Type 2 diabetes mellitus with diabetic chronic kidney disease: Secondary | ICD-10-CM

## 2019-07-24 LAB — CBC WITH DIFFERENTIAL/PLATELET
Abs Immature Granulocytes: 0.02 10*3/uL (ref 0.00–0.07)
Basophils Absolute: 0.1 10*3/uL (ref 0.0–0.1)
Basophils Relative: 1 %
Eosinophils Absolute: 0.3 10*3/uL (ref 0.0–0.5)
Eosinophils Relative: 4 %
HCT: 35.1 % — ABNORMAL LOW (ref 36.0–46.0)
Hemoglobin: 10.8 g/dL — ABNORMAL LOW (ref 12.0–15.0)
Immature Granulocytes: 0 %
Lymphocytes Relative: 32 %
Lymphs Abs: 2.5 10*3/uL (ref 0.7–4.0)
MCH: 26.4 pg (ref 26.0–34.0)
MCHC: 30.8 g/dL (ref 30.0–36.0)
MCV: 85.8 fL (ref 80.0–100.0)
Monocytes Absolute: 0.5 10*3/uL (ref 0.1–1.0)
Monocytes Relative: 6 %
Neutro Abs: 4.4 10*3/uL (ref 1.7–7.7)
Neutrophils Relative %: 57 %
Platelets: 175 10*3/uL (ref 150–400)
RBC: 4.09 MIL/uL (ref 3.87–5.11)
RDW: 13.6 % (ref 11.5–15.5)
WBC: 7.7 10*3/uL (ref 4.0–10.5)
nRBC: 0 % (ref 0.0–0.2)

## 2019-07-24 LAB — FERRITIN: Ferritin: 27 ng/mL (ref 11–307)

## 2019-07-24 LAB — IRON AND TIBC
Iron: 91 ug/dL (ref 28–170)
Saturation Ratios: 31 % (ref 10.4–31.8)
TIBC: 292 ug/dL (ref 250–450)
UIBC: 201 ug/dL

## 2019-07-30 ENCOUNTER — Inpatient Hospital Stay
Admission: EM | Admit: 2019-07-30 | Discharge: 2019-08-01 | DRG: 872 | Disposition: A | Discharge status: Home Health | Payer: Medicare Other | Attending: Internal Medicine | Admitting: Internal Medicine

## 2019-07-30 ENCOUNTER — Emergency Department: Payer: Medicare Other

## 2019-07-30 ENCOUNTER — Other Ambulatory Visit: Payer: Self-pay

## 2019-07-30 DIAGNOSIS — K3184 Gastroparesis: Secondary | ICD-10-CM | POA: Diagnosis present

## 2019-07-30 DIAGNOSIS — E039 Hypothyroidism, unspecified: Secondary | ICD-10-CM | POA: Diagnosis present

## 2019-07-30 DIAGNOSIS — Z20828 Contact with and (suspected) exposure to other viral communicable diseases: Secondary | ICD-10-CM | POA: Diagnosis present

## 2019-07-30 DIAGNOSIS — W19XXXA Unspecified fall, initial encounter: Secondary | ICD-10-CM

## 2019-07-30 DIAGNOSIS — R05 Cough: Secondary | ICD-10-CM | POA: Diagnosis present

## 2019-07-30 DIAGNOSIS — W1830XA Fall on same level, unspecified, initial encounter: Secondary | ICD-10-CM | POA: Diagnosis present

## 2019-07-30 DIAGNOSIS — Z85820 Personal history of malignant melanoma of skin: Secondary | ICD-10-CM

## 2019-07-30 DIAGNOSIS — I890 Lymphedema, not elsewhere classified: Secondary | ICD-10-CM | POA: Diagnosis present

## 2019-07-30 DIAGNOSIS — Y92002 Bathroom of unspecified non-institutional (private) residence single-family (private) house as the place of occurrence of the external cause: Secondary | ICD-10-CM

## 2019-07-30 DIAGNOSIS — G8929 Other chronic pain: Secondary | ICD-10-CM | POA: Diagnosis present

## 2019-07-30 DIAGNOSIS — E1143 Type 2 diabetes mellitus with diabetic autonomic (poly)neuropathy: Secondary | ICD-10-CM | POA: Diagnosis present

## 2019-07-30 DIAGNOSIS — M545 Low back pain: Secondary | ICD-10-CM | POA: Diagnosis present

## 2019-07-30 DIAGNOSIS — Z841 Family history of disorders of kidney and ureter: Secondary | ICD-10-CM

## 2019-07-30 DIAGNOSIS — K589 Irritable bowel syndrome without diarrhea: Secondary | ICD-10-CM | POA: Diagnosis present

## 2019-07-30 DIAGNOSIS — Z825 Family history of asthma and other chronic lower respiratory diseases: Secondary | ICD-10-CM

## 2019-07-30 DIAGNOSIS — E1151 Type 2 diabetes mellitus with diabetic peripheral angiopathy without gangrene: Secondary | ICD-10-CM | POA: Diagnosis present

## 2019-07-30 DIAGNOSIS — Z794 Long term (current) use of insulin: Secondary | ICD-10-CM

## 2019-07-30 DIAGNOSIS — Z87442 Personal history of urinary calculi: Secondary | ICD-10-CM

## 2019-07-30 DIAGNOSIS — M1990 Unspecified osteoarthritis, unspecified site: Secondary | ICD-10-CM | POA: Diagnosis present

## 2019-07-30 DIAGNOSIS — I129 Hypertensive chronic kidney disease with stage 1 through stage 4 chronic kidney disease, or unspecified chronic kidney disease: Secondary | ICD-10-CM | POA: Diagnosis present

## 2019-07-30 DIAGNOSIS — J449 Chronic obstructive pulmonary disease, unspecified: Secondary | ICD-10-CM | POA: Diagnosis present

## 2019-07-30 DIAGNOSIS — Z885 Allergy status to narcotic agent status: Secondary | ICD-10-CM

## 2019-07-30 DIAGNOSIS — Z6841 Body Mass Index (BMI) 40.0 and over, adult: Secondary | ICD-10-CM

## 2019-07-30 DIAGNOSIS — I2510 Atherosclerotic heart disease of native coronary artery without angina pectoris: Secondary | ICD-10-CM | POA: Diagnosis present

## 2019-07-30 DIAGNOSIS — Z91012 Allergy to eggs: Secondary | ICD-10-CM

## 2019-07-30 DIAGNOSIS — E785 Hyperlipidemia, unspecified: Secondary | ICD-10-CM | POA: Diagnosis present

## 2019-07-30 DIAGNOSIS — A419 Sepsis, unspecified organism: Principal | ICD-10-CM | POA: Diagnosis present

## 2019-07-30 DIAGNOSIS — E1122 Type 2 diabetes mellitus with diabetic chronic kidney disease: Secondary | ICD-10-CM | POA: Diagnosis present

## 2019-07-30 DIAGNOSIS — Z96652 Presence of left artificial knee joint: Secondary | ICD-10-CM | POA: Diagnosis present

## 2019-07-30 DIAGNOSIS — Z9981 Dependence on supplemental oxygen: Secondary | ICD-10-CM

## 2019-07-30 DIAGNOSIS — R531 Weakness: Secondary | ICD-10-CM | POA: Diagnosis not present

## 2019-07-30 DIAGNOSIS — Z79890 Hormone replacement therapy (postmenopausal): Secondary | ICD-10-CM

## 2019-07-30 DIAGNOSIS — Z833 Family history of diabetes mellitus: Secondary | ICD-10-CM

## 2019-07-30 DIAGNOSIS — Z91048 Other nonmedicinal substance allergy status: Secondary | ICD-10-CM

## 2019-07-30 DIAGNOSIS — Z79891 Long term (current) use of opiate analgesic: Secondary | ICD-10-CM

## 2019-07-30 DIAGNOSIS — E662 Morbid (severe) obesity with alveolar hypoventilation: Secondary | ICD-10-CM | POA: Diagnosis present

## 2019-07-30 DIAGNOSIS — Z79899 Other long term (current) drug therapy: Secondary | ICD-10-CM

## 2019-07-30 DIAGNOSIS — Z888 Allergy status to other drugs, medicaments and biological substances status: Secondary | ICD-10-CM

## 2019-07-30 DIAGNOSIS — Z91013 Allergy to seafood: Secondary | ICD-10-CM

## 2019-07-30 DIAGNOSIS — M79604 Pain in right leg: Secondary | ICD-10-CM

## 2019-07-30 DIAGNOSIS — Z8249 Family history of ischemic heart disease and other diseases of the circulatory system: Secondary | ICD-10-CM

## 2019-07-30 DIAGNOSIS — N182 Chronic kidney disease, stage 2 (mild): Secondary | ICD-10-CM | POA: Diagnosis present

## 2019-07-30 DIAGNOSIS — I252 Old myocardial infarction: Secondary | ICD-10-CM

## 2019-07-30 DIAGNOSIS — K7460 Unspecified cirrhosis of liver: Secondary | ICD-10-CM | POA: Diagnosis present

## 2019-07-30 LAB — LACTIC ACID, PLASMA
Lactic Acid, Venous: 2.2 mmol/L (ref 0.5–1.9)
Lactic Acid, Venous: 2.2 mmol/L (ref 0.5–1.9)

## 2019-07-30 LAB — CBC WITH DIFFERENTIAL/PLATELET
Abs Immature Granulocytes: 0.03 10*3/uL (ref 0.00–0.07)
Basophils Absolute: 0.1 10*3/uL (ref 0.0–0.1)
Basophils Relative: 1 %
Eosinophils Absolute: 0.1 10*3/uL (ref 0.0–0.5)
Eosinophils Relative: 1 %
HCT: 37 % (ref 36.0–46.0)
Hemoglobin: 11.6 g/dL — ABNORMAL LOW (ref 12.0–15.0)
Immature Granulocytes: 0 %
Lymphocytes Relative: 14 %
Lymphs Abs: 1.2 10*3/uL (ref 0.7–4.0)
MCH: 26.5 pg (ref 26.0–34.0)
MCHC: 31.4 g/dL (ref 30.0–36.0)
MCV: 84.7 fL (ref 80.0–100.0)
Monocytes Absolute: 0.5 10*3/uL (ref 0.1–1.0)
Monocytes Relative: 5 %
Neutro Abs: 6.7 10*3/uL (ref 1.7–7.7)
Neutrophils Relative %: 79 %
Platelets: 172 10*3/uL (ref 150–400)
RBC: 4.37 MIL/uL (ref 3.87–5.11)
RDW: 13.9 % (ref 11.5–15.5)
WBC: 8.5 10*3/uL (ref 4.0–10.5)
nRBC: 0 % (ref 0.0–0.2)

## 2019-07-30 LAB — COMPREHENSIVE METABOLIC PANEL
ALT: 34 U/L (ref 0–44)
AST: 39 U/L (ref 15–41)
Albumin: 3.3 g/dL — ABNORMAL LOW (ref 3.5–5.0)
Alkaline Phosphatase: 125 U/L (ref 38–126)
Anion gap: 8 (ref 5–15)
BUN: 10 mg/dL (ref 8–23)
CO2: 31 mmol/L (ref 22–32)
Calcium: 9.8 mg/dL (ref 8.9–10.3)
Chloride: 95 mmol/L — ABNORMAL LOW (ref 98–111)
Creatinine, Ser: 0.76 mg/dL (ref 0.44–1.00)
GFR calc Af Amer: >60 mL/min (ref >60)
GFR calc non Af Amer: >60 mL/min (ref >60)
Glucose, Bld: 257 mg/dL — ABNORMAL HIGH (ref 70–99)
Potassium: 4.3 mmol/L (ref 3.5–5.1)
Sodium: 134 mmol/L — ABNORMAL LOW (ref 135–145)
Total Bilirubin: 0.5 mg/dL (ref 0.3–1.2)
Total Protein: 7.2 g/dL (ref 6.5–8.1)

## 2019-07-30 LAB — URINALYSIS, ROUTINE W REFLEX MICROSCOPIC
Bacteria, UA: NONE SEEN
Bilirubin Urine: NEGATIVE
Glucose, UA: >=500 mg/dL — AB
Hgb urine dipstick: NEGATIVE
Ketones, ur: NEGATIVE mg/dL
Leukocytes,Ua: NEGATIVE
Nitrite: NEGATIVE
Protein, ur: 30 mg/dL — AB
Specific Gravity, Urine: 1.011 (ref 1.005–1.030)
pH: 8 (ref 5.0–8.0)

## 2019-07-30 LAB — PROTIME-INR
INR: 1 (ref 0.8–1.2)
Prothrombin Time: 13.2 seconds (ref 11.4–15.2)

## 2019-07-30 LAB — PROCALCITONIN: Procalcitonin: <0.1 ng/mL

## 2019-07-30 LAB — GLUCOSE, CAPILLARY: Glucose-Capillary: 253 mg/dL — ABNORMAL HIGH (ref 70–99)

## 2019-07-30 LAB — CK: Total CK: 91 U/L (ref 38–234)

## 2019-07-30 LAB — APTT: aPTT: 33 seconds (ref 24–36)

## 2019-07-30 LAB — SARS CORONAVIRUS 2 BY RT PCR (HOSPITAL ORDER, PERFORMED IN ~~LOC~~ HOSPITAL LAB): SARS Coronavirus 2: NEGATIVE

## 2019-07-30 MED ORDER — CYCLOBENZAPRINE HCL 10 MG PO TABS
20.0000 mg | ORAL_TABLET | Freq: Every day | ORAL | Status: DC
  Administered 2019-07-30 – 2019-07-31 (×2): 20 mg via ORAL
  Filled 2019-07-30 (×2): qty 2

## 2019-07-30 MED ORDER — OXYCODONE-ACETAMINOPHEN 5-325 MG PO TABS
1.0000 | ORAL_TABLET | Freq: Four times a day (QID) | ORAL | Status: DC | PRN
  Administered 2019-07-31 – 2019-08-01 (×3): 1 via ORAL
  Filled 2019-07-30 (×3): qty 1

## 2019-07-30 MED ORDER — VANCOMYCIN HCL IN DEXTROSE 1-5 GM/200ML-% IV SOLN
1000.0000 mg | Freq: Two times a day (BID) | INTRAVENOUS | Status: DC
  Administered 2019-07-31 – 2019-08-01 (×2): 1000 mg via INTRAVENOUS
  Filled 2019-07-30 (×4): qty 200

## 2019-07-30 MED ORDER — INSULIN DETEMIR 100 UNIT/ML ~~LOC~~ SOLN
70.0000 [IU] | Freq: Two times a day (BID) | SUBCUTANEOUS | Status: DC
  Administered 2019-07-31 – 2019-08-01 (×4): 70 [IU] via SUBCUTANEOUS
  Filled 2019-07-30 (×7): qty 0.7

## 2019-07-30 MED ORDER — ALPRAZOLAM 0.5 MG PO TABS
0.5000 mg | ORAL_TABLET | Freq: Four times a day (QID) | ORAL | Status: DC
  Administered 2019-07-30 – 2019-08-01 (×7): 0.5 mg via ORAL
  Filled 2019-07-30 (×7): qty 1

## 2019-07-30 MED ORDER — INSULIN ASPART 100 UNIT/ML ~~LOC~~ SOLN
0.0000 [IU] | Freq: Three times a day (TID) | SUBCUTANEOUS | Status: DC
  Administered 2019-07-31 (×2): 3 [IU] via SUBCUTANEOUS
  Administered 2019-07-31: 5 [IU] via SUBCUTANEOUS
  Administered 2019-08-01: 2 [IU] via SUBCUTANEOUS
  Administered 2019-08-01: 5 [IU] via SUBCUTANEOUS
  Filled 2019-07-30 (×5): qty 1

## 2019-07-30 MED ORDER — CARVEDILOL 25 MG PO TABS
37.5000 mg | ORAL_TABLET | Freq: Two times a day (BID) | ORAL | Status: DC
  Administered 2019-07-31 – 2019-08-01 (×3): 50 mg via ORAL
  Filled 2019-07-30 (×3): qty 2

## 2019-07-30 MED ORDER — DICLOFENAC SODIUM 1 % TD GEL
4.0000 g | TRANSDERMAL | Status: DC | PRN
  Filled 2019-07-30: qty 100

## 2019-07-30 MED ORDER — SUCRALFATE 1 G PO TABS
1.0000 g | ORAL_TABLET | Freq: Every day | ORAL | Status: DC
  Filled 2019-07-30: qty 1

## 2019-07-30 MED ORDER — SODIUM CHLORIDE 0.9 % IV BOLUS
1000.0000 mL | Freq: Once | INTRAVENOUS | Status: AC
  Administered 2019-07-30: 1000 mL via INTRAVENOUS

## 2019-07-30 MED ORDER — HYDRALAZINE HCL 50 MG PO TABS
25.0000 mg | ORAL_TABLET | Freq: Every day | ORAL | Status: DC
  Administered 2019-07-30 – 2019-07-31 (×2): 25 mg via ORAL
  Filled 2019-07-30 (×2): qty 1

## 2019-07-30 MED ORDER — OXYCODONE HCL 5 MG PO TABS
5.0000 mg | ORAL_TABLET | Freq: Four times a day (QID) | ORAL | Status: DC | PRN
  Administered 2019-07-31 – 2019-08-01 (×3): 5 mg via ORAL
  Filled 2019-07-30 (×3): qty 1

## 2019-07-30 MED ORDER — LACTULOSE 10 GM/15ML PO SOLN: 10.0000 g | Freq: Every day | ORAL | Status: DC | PRN

## 2019-07-30 MED ORDER — ROSUVASTATIN CALCIUM 10 MG PO TABS
10.0000 mg | ORAL_TABLET | Freq: Every day | ORAL | Status: DC
  Filled 2019-07-30 (×2): qty 1

## 2019-07-30 MED ORDER — LEVOTHYROXINE SODIUM 50 MCG PO TABS
150.0000 ug | ORAL_TABLET | Freq: Every day | ORAL | Status: DC
  Administered 2019-07-31 – 2019-08-01 (×2): 150 ug via ORAL
  Filled 2019-07-30 (×2): qty 1

## 2019-07-30 MED ORDER — VANCOMYCIN HCL 1.5 G IV SOLR
1500.0000 mg | Freq: Once | INTRAVENOUS | Status: AC
  Administered 2019-07-30: 1500 mg via INTRAVENOUS
  Filled 2019-07-30: qty 1500

## 2019-07-30 MED ORDER — ENOXAPARIN SODIUM 40 MG/0.4ML ~~LOC~~ SOLN
40.0000 mg | Freq: Two times a day (BID) | SUBCUTANEOUS | Status: DC
  Administered 2019-07-31 – 2019-08-01 (×4): 40 mg via SUBCUTANEOUS
  Filled 2019-07-30 (×3): qty 0.4

## 2019-07-30 MED ORDER — PANTOPRAZOLE SODIUM 40 MG PO TBEC
40.0000 mg | DELAYED_RELEASE_TABLET | Freq: Every day | ORAL | Status: DC
  Administered 2019-07-31 – 2019-08-01 (×2): 40 mg via ORAL
  Filled 2019-07-30 (×2): qty 1

## 2019-07-30 MED ORDER — DOXAZOSIN MESYLATE 4 MG PO TABS
4.0000 mg | ORAL_TABLET | Freq: Every day | ORAL | Status: DC
  Administered 2019-07-31 (×2): 4 mg via ORAL
  Filled 2019-07-30 (×3): qty 1

## 2019-07-30 MED ORDER — ESCITALOPRAM OXALATE 10 MG PO TABS
10.0000 mg | ORAL_TABLET | Freq: Every day | ORAL | Status: DC
  Administered 2019-07-31 – 2019-08-01 (×2): 10 mg via ORAL
  Filled 2019-07-30 (×2): qty 1

## 2019-07-30 MED ORDER — BUDESONIDE 0.5 MG/2ML IN SUSP
0.5000 mg | Freq: Two times a day (BID) | RESPIRATORY_TRACT | Status: DC
  Administered 2019-07-31 – 2019-08-01 (×3): 0.5 mg via RESPIRATORY_TRACT
  Filled 2019-07-30 (×5): qty 2

## 2019-07-30 MED ORDER — METRONIDAZOLE IN NACL 5-0.79 MG/ML-% IV SOLN
500.0000 mg | Freq: Once | INTRAVENOUS | Status: AC
  Administered 2019-07-31: 500 mg via INTRAVENOUS
  Filled 2019-07-30: qty 100

## 2019-07-30 MED ORDER — IRBESARTAN 150 MG PO TABS
300.0000 mg | ORAL_TABLET | Freq: Every day | ORAL | Status: DC
  Filled 2019-07-30 (×2): qty 2

## 2019-07-30 MED ORDER — POTASSIUM CHLORIDE CRYS ER 10 MEQ PO TBCR
10.0000 meq | EXTENDED_RELEASE_TABLET | Freq: Every day | ORAL | Status: DC
  Administered 2019-07-31 – 2019-08-01 (×2): 10 meq via ORAL
  Filled 2019-07-30 (×2): qty 1

## 2019-07-30 MED ORDER — TRAZODONE HCL 50 MG PO TABS
150.0000 mg | ORAL_TABLET | Freq: Every day | ORAL | Status: DC
  Administered 2019-07-31 (×2): 150 mg via ORAL
  Filled 2019-07-30: qty 3

## 2019-07-30 MED ORDER — TRAMADOL HCL 50 MG PO TABS
50.0000 mg | ORAL_TABLET | Freq: Two times a day (BID) | ORAL | Status: DC
  Administered 2019-07-30 – 2019-07-31 (×2): 50 mg via ORAL
  Filled 2019-07-30 (×2): qty 1

## 2019-07-30 MED ORDER — IBUPROFEN 400 MG PO TABS
800.0000 mg | ORAL_TABLET | Freq: Three times a day (TID) | ORAL | Status: DC | PRN
  Administered 2019-08-01: 09:00:00 800 mg via ORAL
  Filled 2019-07-30: qty 2

## 2019-07-30 MED ORDER — GABAPENTIN 600 MG PO TABS
600.0000 mg | ORAL_TABLET | Freq: Three times a day (TID) | ORAL | Status: DC
  Administered 2019-07-31 – 2019-08-01 (×5): 600 mg via ORAL
  Filled 2019-07-30 (×5): qty 1

## 2019-07-30 MED ORDER — INSULIN ASPART 100 UNIT/ML ~~LOC~~ SOLN
0.0000 [IU] | Freq: Every day | SUBCUTANEOUS | Status: DC
  Administered 2019-07-31: 21:00:00 2 [IU] via SUBCUTANEOUS
  Administered 2019-07-31: 3 [IU] via SUBCUTANEOUS
  Filled 2019-07-30 (×2): qty 1

## 2019-07-30 MED ORDER — VANCOMYCIN HCL IN DEXTROSE 1-5 GM/200ML-% IV SOLN
1000.0000 mg | Freq: Once | INTRAVENOUS | Status: AC
  Administered 2019-07-30: 1000 mg via INTRAVENOUS
  Filled 2019-07-30: qty 200

## 2019-07-30 MED ORDER — CLONIDINE HCL 0.3 MG/24HR TD PTWK
0.3000 mg | MEDICATED_PATCH | TRANSDERMAL | Status: DC
  Administered 2019-07-31: 0.3 mg via TRANSDERMAL
  Filled 2019-07-30: qty 1

## 2019-07-30 MED ORDER — LUBIPROSTONE 24 MCG PO CAPS
24.0000 ug | ORAL_CAPSULE | Freq: Two times a day (BID) | ORAL | Status: DC
  Filled 2019-07-30 (×4): qty 1

## 2019-07-30 MED ORDER — METOCLOPRAMIDE HCL 5 MG PO TABS
10.0000 mg | ORAL_TABLET | Freq: Three times a day (TID) | ORAL | Status: DC
  Administered 2019-07-31 – 2019-08-01 (×5): 10 mg via ORAL
  Filled 2019-07-30 (×4): qty 2
  Filled 2019-07-30: qty 1

## 2019-07-30 MED ORDER — ONDANSETRON HCL 4 MG PO TABS
4.0000 mg | ORAL_TABLET | ORAL | Status: DC | PRN
  Filled 2019-07-30: qty 1

## 2019-07-30 MED ORDER — FLUTICASONE PROPIONATE HFA 220 MCG/ACT IN AERO: 2.0000 | INHALATION_SPRAY | Freq: Two times a day (BID) | RESPIRATORY_TRACT | Status: DC

## 2019-07-30 MED ORDER — LINACLOTIDE 290 MCG PO CAPS
290.0000 ug | ORAL_CAPSULE | Freq: Every day | ORAL | Status: DC
  Administered 2019-07-31: 290 ug via ORAL
  Filled 2019-07-30 (×2): qty 1

## 2019-07-30 MED ORDER — SODIUM CHLORIDE 0.9 % IV SOLN
2.0000 g | Freq: Three times a day (TID) | INTRAVENOUS | Status: DC
  Administered 2019-07-31 – 2019-08-01 (×4): 2 g via INTRAVENOUS
  Filled 2019-07-30 (×6): qty 2

## 2019-07-30 MED ORDER — BUTALBITAL-APAP-CAFFEINE 50-325-40 MG PO TABS
1.0000 | ORAL_TABLET | Freq: Every day | ORAL | Status: DC
  Filled 2019-07-30: qty 1

## 2019-07-30 MED ORDER — OXYCODONE-ACETAMINOPHEN 10-325 MG PO TABS: 1.0000 | ORAL_TABLET | Freq: Four times a day (QID) | ORAL | Status: DC | PRN

## 2019-07-30 MED ORDER — BUMETANIDE 1 MG PO TABS
1.0000 mg | ORAL_TABLET | Freq: Every day | ORAL | Status: DC
  Filled 2019-07-30: qty 1

## 2019-07-30 MED ORDER — SODIUM CHLORIDE 0.9 % IV SOLN
2.0000 g | Freq: Once | INTRAVENOUS | Status: AC
  Administered 2019-07-30: 2 g via INTRAVENOUS
  Filled 2019-07-30: qty 2

## 2019-07-30 MED ORDER — PRAMIPEXOLE DIHYDROCHLORIDE 1 MG PO TABS
1.0000 mg | ORAL_TABLET | Freq: Every day | ORAL | Status: DC
  Administered 2019-07-31 (×2): 1 mg via ORAL
  Filled 2019-07-30 (×2): qty 1

## 2019-07-30 NOTE — ED Notes (Signed)
Patient transported to X-ray 

## 2019-07-30 NOTE — ED Provider Notes (Signed)
[facility] Emergency Department Provider Note  Time seen: 3:20 PM  I have reviewed the triage vital signs and the nursing notes.   HISTORY  Chief Complaint Fever Weakness Fall  HPI Brittney [name] is a 68 y.o. female with a past medical history of CKD, diabetes, hypertension, hyperlipidemia, obesity, presents to the emergency department after a fall.  According to the patient for the past 2 days she has been experiencing congestion in her chest, fever size 102.6, occasional cough.  Denies any significant shortness of breath, patient wears 3 L of oxygen as needed at home at baseline.  Denies any chest pain or abdominal pain, nausea vomiting or diarrhea.  Patient states she was feeling very weak last night and had a fall where she slumped down onto her knees and onto the bathroom floor.  Patient was unable to get up throughout the night, [employer] member came to the patient's house and found her on the floor, they called family to try to help her up but they were unable to do so so they called EMS to bring her to the emergency department.  Patient states she is felt progressively more weak over the past 2 to 3 days with fevers high as 102.6.  Past Medical History:  Diagnosis Date  . Abdominal pain, left upper quadrant 12/11/2012  . Arthritis   . Asthma   . Broken leg 2014   fell twice and broke same leg. had a bone stimulator for first break.  . Cancer ([place]) 2007   melanoma, left ear and back of left leg, removed from back  . Chronic kidney disease, stage 2 (mild)   . Chronic lower back pain   . Collagen vascular disease ([place])   . Coronary artery disease   . Diabetes mellitus without complication ([place])   . Diabetic nephropathy associated with secondary diabetes mellitus ([place])   . [place] 12/11/2012  . Gastroparesis   . Gross hematuria 12/11/2012  . Hepatic cirrhosis ([place])   . Hyperlipidemia   . Hypertension   . Hypothyroidism   . Hypothyroidism   .  IBS (irritable bowel syndrome)   . Kidney stone 12/11/2012  . Lower extremity edema   . Morbid obesity with BMI of 40.0-44.9, adult ([place])   . Myocardial infarction ([place]) 09/2013  . Nausea without vomiting 12/11/2012  . Nephrolithiasis   . Nephrolithiasis 04/26/2014  . Nonproliferative retinopathy due to secondary diabetes ([place])   . Numbness and tingling of right leg   . Peripheral vascular disease ([place])   . Renal colic 42/70/6237  . Renal insufficiency   . Sciatica   . Sciatica 12/11/2012  . Skin cancer 08/2018  . Sleep apnea    sleep study coming in november  . Unilateral small kidney without contralateral hypertrophy     Patient Active Problem List   Diagnosis Date Noted  . Lymphedema 09/03/2018  . Chronic venous insufficiency 09/03/2018  . Status post lumbar spine surgery for decompression of spinal cord 03/30/2018  . Myofascial pain 03/30/2018  . Spinal stenosis of thoracic region 03/30/2018  . Back pain 11/02/2017  . Iron deficiency anemia 05/10/2017  . Long term prescription benzodiazepine use 01/18/2017  . Vitamin D insufficiency 01/18/2017  . Neurogenic pain 01/17/2017  . L3-4 severe lumbar facet hypertrophy and spinal stenosis 01/10/2017  . Diabetes with retinopathy ([place]) 12/07/2016  . Diabetic nephropathy ([place]) 12/07/2016  . Gastroparesis due to DM ([place]) 12/07/2016  . Long term current use of opiate analgesic 12/07/2016  .  Long term prescription opiate use 12/07/2016  . Opiate use 12/07/2016  . Chronic pain syndrome 12/07/2016  . Chronic low back pain (Location of Primary Source of Pain) (Bilateral) (R>L) 12/07/2016  . 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 Chronic lower extremity pain (Location of Secondary source of pain) (Bilateral) (R>L) 12/07/2016  . Chronic knee pain (Location of Tertiary source of pain) (Right) 12/07/2016  . Osteoarthritis of knee (Right) 12/07/2016  . Grade 1 Anterolisthesis of L3 over L4 and L4 over L5 12/07/2016  .  Osteoarthritis of sacroiliac joint (Right) 12/07/2016  . Generalized osteoarthritis of multiple sites 10/22/2016  . Degenerative spondylolisthesis 09/08/2016  . L3-4 severe lumbar spinal stenosis (12/31/2016 MRI) 09/08/2016  . Lung nodule, multiple 08/31/2016  . Hepatic cirrhosis ([place]) 06/20/2016  . Fibromyalgia 04/15/2016  . Seronegative arthritis 04/15/2016  . Diabetes mellitus with peripheral vascular disease ([place]) 03/16/2016  . 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 Morbid obesity with BMI of 40.0-44.9, adult ([place]) 03/16/2016  . Type 2 diabetes mellitus with diabetic nephropathy, with long-term current use of insulin ([place]) 03/16/2016  . Microalbuminuria 02/20/2016  . Essential hypertension 01/27/2016  . DM (diabetes mellitus) type II uncontrolled, periph vascular disorder ([place]) 04/26/2014  . Hyperlipemia 04/26/2014  . OSA (obstructive sleep apnea) 04/26/2014  . Abnormal tumor markers 12/11/2012  . Chronic kidney disease, stage II (mild) 12/11/2012  . Unilateral small kidney 12/11/2012    Past Surgical History:  Procedure Laterality Date  . ABDOMINAL HYSTERECTOMY    . BACK SURGERY  04/1999   L-4-5 LAMINECTOMY. metal in lower back and neck  . CESAREAN SECTION  1980  . CHOLECYSTECTOMY    . COLONOSCOPY  05/04/2001  . COLONOSCOPY WITH PROPOFOL N/A 09/20/2016   Procedure: COLONOSCOPY WITH PROPOFOL;  Surgeon: Lollie Sails, MD;  Location: [facility] ENDOSCOPY;  Service: Endoscopy;  Laterality: N/A;  . DILATION AND CURETTAGE OF UTERUS    . ESOPHAGOGASTRODUODENOSCOPY  02/08/2014  . ESOPHAGOGASTRODUODENOSCOPY (EGD) WITH PROPOFOL N/A 09/20/2016   Procedure: ESOPHAGOGASTRODUODENOSCOPY (EGD) WITH PROPOFOL;  Surgeon: Lollie Sails, MD;  Location: [facility] ENDOSCOPY;  Service: Endoscopy;  Laterality: N/A;  . ESOPHAGOGASTRODUODENOSCOPY (EGD) WITH PROPOFOL N/A 05/23/2018    Procedure: ESOPHAGOGASTRODUODENOSCOPY (EGD) WITH PROPOFOL;  Surgeon: Lollie Sails, MD;  Location[facility]COPY;  Service: Endoscopy;  Laterality: N/A;  . EYE SURGERY  1986   FOR MELANOMA  . HERNIA REPAIR    . JOINT REPLACEMENT Left 09/24/2013   TOTAL KNEE  . MOHS SURGERY  11/2016   left side of nose  . PULSE GENERATOR IMPLANT N/A 11/02/2017   Procedure: UNILATERAL PULSE GENERATOR IMPLANT;  Surgeon: Meade Maw, MD;  Location: [facility] ORS;  Service: Neurosurgery;  Laterality: N/A;  . TONSILLECTOMY     WITH ADENOIDECTOMY    Prior to Admission medications   Medication Sig Start Date End Date Taking? Authorizing Provider  albuterol (VENTOLIN HFA) 108 (90 Base) MCG/ACT inhaler TAKE 2 PUFFS BY MOUTH 3 TIMES A DAY AS NEEDED FOR SHORTNESS OF BREATH 05/10/16   [provider]  ALPRAZolam Duanne Moron) 0.5 MG tablet Take 0.5 mg by mouth 4 (four) times daily.  12/11/12   [provider]  bumetanide (BUMEX) 1 MG tablet Take by mouth daily.     [provider]  butalbital-acetaminophen-caffeine (FIORICET, ESGIC) [phone] MG tablet TAKE ONE TABLET BY MOUTH ONCE DAILY 11/02/16   [provider]  candesartan (ATACAND)  32 MG tablet Take 1 tablet (32 mg total) by mouth daily. 02/22/19 02/22/20  Wilhelmina Mcardle, MD  carvedilol (COREG) 25 MG tablet Take 37.5-50 mg by mouth 2 (two) times daily with a meal. TAKES 2 TABLETS IN THE MORNING AND 1.5 TABLETS IN THE EVENING 03/25/17   [provider]  cloNIDine (CATAPRES - DOSED IN MG/24 HR) 0.3 mg/24hr patch Place 0.3 mg onto the skin once a week.     [provider]  cyclobenzaprine (FLEXERIL) 10 MG tablet Take 20 mg by mouth at bedtime.    [provider]  diclofenac sodium (VOLTAREN) 1 % GEL Apply 4 g topically as needed (PAIN).  09/12/14   [provider]  doxazosin (CARDURA) 4 MG tablet Take 4 mg by mouth at bedtime.  12/11/12   [provider]  escitalopram (LEXAPRO) 10 MG  tablet Take 10 mg by mouth daily.  12/13/12   [provider]  fluticasone (FLONASE) 50 MCG/ACT nasal spray PLACE 2 SPRAYS INTO BOTH NOSTRILS 2 (TWO) TIMES DAILY AS NEEDED FOR ALLERGIES 08/20/16   [provider]  fluticasone (FLOVENT HFA) 220 MCG/ACT inhaler TAKE 2 PUFFS BY MOUTH TWICE A DAY 05/22/19   Wilhelmina Mcardle, MD  gabapentin (NEURONTIN) 600 MG tablet Take 1 tablet (600 mg total) by mouth 6 (six) times daily. Patient taking differently: Take 600 mg by mouth See admin instructions.  01/18/17 05/17/19  Milinda Pointer, MD  gabapentin (NEURONTIN) 600 MG tablet Take 600 mg by mouth 3 (three) times daily. 06/06/19   [provider]  hydrALAZINE (APRESOLINE) 25 MG tablet Take 25 mg by mouth at bedtime.  03/30/16   [provider]  ibuprofen (ADVIL,MOTRIN) 800 MG tablet Take 800 mg by mouth 3 (three) times daily as needed.     [provider]  Insulin Detemir (LEVEMIR FLEXTOUCH) 100 UNIT/ML Pen 2 (two) times daily. 70 units in the am 70 units in evening 10/04/16   [provider]  Insulin Pen Needle (FIFTY50 PEN NEEDLES) 32G X 6 MM MISC 5 (five) times daily. 09/27/16   [provider]  insulin regular human CONCENTRATED (HUMULIN R) 500 UNIT/ML kwikpen Inject 80 Units into the skin 3 (three) times daily with meals. PER SLIDING SCALE 03/16/16   [provider]  ketoconazole (NIZORAL) 2 % shampoo APPLY TO AFFECTED AREAS (LEAVE ON FOR 5 MINUTES) , RINSE WELL, USE 2-3 TIMES PER WEEK. 10/10/17   [provider]  lactulose (CHRONULAC) 10 GM/15ML solution Take 10 g by mouth daily as needed for mild constipation.    [provider]  levothyroxine (SYNTHROID, LEVOTHROID) 150 MCG tablet Take 150 mcg by mouth daily before breakfast.  03/16/18   [provider]  lidocaine (LIDODERM) 5 % PLACE 1 PATCH ONTO THE MOST PAINFUL AREA OF SKIN DAILY FOR UP TO 12 HOURS IN A 24 HOUR PERIOD 10/22/16   [provider]   LINZESS 290 MCG CAPS capsule Take 290 mcg by mouth daily. 07/17/19   [provider]  lubiprostone (AMITIZA) 24 MCG capsule TAKE 1 CAPSULE (24 MCG TOTAL) BY MOUTH 2 (TWO) TIMES DAILY WITH MEALS. 09/18/18   [provider]  metoCLOPramide (REGLAN) 10 MG tablet Take 10 mg by mouth 3 (three) times daily. 12/11/12   [provider]  nitroGLYCERIN (NITRODUR - DOSED IN MG/24 HR) 0.4 mg/hr patch Place 1 patch onto the skin as needed. 06/12/18   [provider]  ondansetron (ZOFRAN) 4 MG tablet Take 4 mg by mouth  as needed. 05/14/19   [provider]  oxyCODONE-acetaminophen (PERCOCET) 10-325 MG tablet Take 1 tablet by mouth 2 (two) times daily as needed for pain. Must last 30 days. 07/10/19 08/09/19  [name], MD  oxyCODONE-acetaminophen (PERCOCET) 10-325 MG tablet Take 1 tablet by mouth 2 (two) times daily as needed for pain. Must last 30 days. 08/09/19 09/08/19  [name], MD  pantoprazole (PROTONIX) 40 MG tablet TAKE 1 TABLET (40 MG TOTAL) BY MOUTH 2 (TWO) TIMES DAILY. TAKE ONE HOUR BEFORE A MEAL 08/22/15   [provider]  potassium chloride (K-DUR,KLOR-CON) 10 MEQ tablet TAKE 1 TABLET BY MOUTH ONCE A DAY 08/19/14   [provider]  pramipexole (MIRAPEX) 0.5 MG tablet TAKE 2 TABLETS BY MOUTH AT BEDTIME 11/16/16   [provider]  rosuvastatin (CRESTOR) 10 MG tablet Take 10 mg by mouth daily. 07/04/19 07/03/20  [provider]  sucralfate (CARAFATE) 1 g tablet Take 1 tablet by mouth daily. 07/06/18   [provider]  traMADol (ULTRAM) 50 MG tablet Take 2 tablets (100 mg total) by mouth every 8 (eight) hours. Patient not taking: Reported on 05/17/2019 01/18/17 05/17/19  Milinda Pointer, MD  traMADol (ULTRAM) 50 MG tablet Take 50 mg by mouth 2 (two) times daily. 1 in am and 2 at bedtime 07/10/18   [provider]  traZODone (DESYREL) 150 MG tablet Take 150 mg by mouth at bedtime.  12/11/12   [provider]  zolpidem (AMBIEN) 10 MG tablet Take 10 mg by mouth at bedtime.  12/11/12   [provider]    Allergies  Allergen Reactions  . Eggs Or Egg-Derived Products Nausea And Vomiting    Only has reaction to flu vaccine due to eggs, no reaction to egg food products  . Shellfish Allergy Nausea And Vomiting, Diarrhea and Nausea Only    Non stop sickness once ingests shellfish. Betadine is okay  . Amlodipine Cough  . Codeine Nausea And Vomiting and Nausea Only    states she can take codeine cough syrup without problems states she can take codeine cough syrup without problems  . Imdur [Isosorbide Dinitrate] Other (See Comments)    Headache  . Lyrica [Pregabalin] Swelling and Other (See Comments)    "FLU-LIKE" SYMPTOMS  . Monosodium Glutamate Other (See Comments)    Headache  . Tape Other (See Comments) and Nausea And Vomiting    can use paper tape Redness    Family History  Problem Relation Age of Onset  . Heart disease Mother   . Cancer Mother   . Asthma Mother   . Diabetes Father   . Kidney disease Father   . Hypertension Father   . Breast cancer Neg Hx     Social History Social History   Tobacco Use  . Smoking status: Never Smoker  . Smokeless tobacco: Never Used  Substance Use Topics  . Alcohol use: No  . Drug use: No    Review of Systems Constitutional: Fever to 102.6 at home. ENT: Positive for congestion. Cardiovascular: Negative for chest pain. Respiratory: Negative for shortness of breath.  Positive for cough. Gastrointestinal: Negative for abdominal pain Genitourinary: Negative for urinary compaints Musculoskeletal: Right knee pain Skin: Negative for skin complaints  Neurological: Negative for headache All other ROS negative  ____________________________________________   PHYSICAL EXAM:  VITAL SIGNS: ED Triage Vitals  Enc Vitals Group     BP      Pulse      Resp  Temp      Temp src      SpO2      Weight      Height      Head  Circumference      Peak Flow      Pain Score      Pain Loc      Pain Edu?      Excl. in [place]?    Constitutional: Alert and oriented. Well appearing and in no distress. Eyes: Normal exam ENT      Head: Normocephalic and atraumatic.      Mouth/Throat: Mucous membranes are moist. Cardiovascular: Normal rate, regular rhythm.  Respiratory: Normal respiratory effort without tachypnea nor retractions. Breath sounds are clear Gastrointestinal: Soft and nontender. No distention.  Obese Musculoskeletal: Moderate tenderness palpation the right knee with pain with range of motion as well.  Hips are nontender.  1+ lower extremity edema. Neurologic:  Normal speech and language. No gross focal neurologic deficits Skin:  Skin is warm, dry and intact.  Psychiatric: Mood and affect are normal  ____________________________________________    EKG  EKG viewed and interpreted by myself shows a sinus rhythm 83 bpm with a narrow QRS, normal axis, normal intervals, no concerning ST changes  ____________________________________________    RADIOLOGY  Chest x-ray negative. Knee x-ray negative for acute abnormality.   ____________________________________________   INITIAL IMPRESSION / ASSESSMENT AND PLAN / ED COURSE  Pertinent labs & imaging results that were available during my care of the patient were reviewed by me and considered in my medical decision making (see chart for details).   Patient presents to the emergency department after a fall with generalized weakness reported fever to 102.6 at home.  Differential would include sepsis, pneumonia, UTI, coronavirus, metabolic or electrolyte abnormality, rhabdomyolysis.  We will check labs, urinalysis, cultures, coronavirus swab, chest x-ray and continue to closely monitor.  Patient agreeable to plan of care.  Patient's lactic acid has resulted mildly elevated, remainder the labs are largely at baseline.  However given the patient's reported fever to  102.6, fever and generalized weakness patient will be admitted for continued IV antibiotics for further work-up and treatment.  Patient agreeable to plan of care.  Brittney [name] was evaluated in Emergency Department on 07/30/2019 for the symptoms described in the history of present illness. She was evaluated in the context of the global COVID-19 pandemic, which necessitated consideration that the patient might be at risk for infection with the SARS-CoV-2 virus that causes COVID-19. Institutional protocols and algorithms that pertain to the evaluation of patients at risk for COVID-19 are in a state of rapid change based on information released by regulatory bodies including the CDC and federal and state organizations. These policies and algorithms were followed during the patient's care in the ED.  ____________________________________________   FINAL CLINICAL IMPRESSION(S) / ED DIAGNOSES  Fall Fever   Harvest Dark, MD 07/30/19 2253

## 2019-07-30 NOTE — Progress Notes (Addendum)
CODE SEPSIS - PHARMACY COMMUNICATION  **Broad Spectrum Antibiotics should be administered within 1 hour of Sepsis diagnosis**  Time Code Sepsis Called/Page Received: 8/3 1526  Antibiotics Ordered:Vanc, Cefepime  Time of 1st antibiotic administration:  1837  Additional action taken by pharmacy: messaged ED MD at 8/3 1416- he is waiting on Cultures to be drawn and Covid test before ordering abx   If necessary, Name of Provider/Nurse Contacted: Miachel Roux ,PharmD Clinical Pharmacist  07/30/2019  4:49 PM

## 2019-07-30 NOTE — ED Notes (Signed)
Pt assisted with bed pan

## 2019-07-30 NOTE — ED Notes (Signed)
Pt cleaned up and repositioned at this time. Pt given blankets and resting comfortably.

## 2019-07-30 NOTE — ED Notes (Signed)
Date and time results received: 08/03/201735 (use smartphrase ".now" to insert current time)  Test: lactic Critical Value: 2.2  Name of Provider Notified: Paduchowski  Orders Received? Or Actions Taken?: Orders Received - See Orders for details

## 2019-07-30 NOTE — H&P (Addendum)
[facility] NAME: Brittney [name]    MR#:  [id number]  DATE OF BIRTH:  December 29, 1950  DATE OF ADMISSION:  07/30/2019  PRIMARY CARE PHYSICIAN: Rusty Aus, MD   REQUESTING/REFERRING PHYSICIAN: Harvest Dark, MD  CHIEF COMPLAINT:   Chief Complaint  Patient presents with   Fall    HISTORY OF PRESENT ILLNESS:  68 y.o. female with pertinent past medical history of type 2 diabetes mellitus, hypothyroidism, hypertension, status post left total knee replacement, bilateral lymphedema, COPD, OSA on CPAP and 2 L O2 at bedtime, CKD stage II CAD, MI, and chronic lower back pain presenting to the ED with chief complaint of mechanical fall.  Patient states that she was getting ready for bed last night around 11:30 PM and suddenly her legs gave out and she fell on the floor.  She denies hitting her head or losing consciousness.  Patient states she made several attempts to get up from the floor to call for help but she was unable to therefore she laid on the floor during the night.  Patient was found this morning by [employer] who called for assistance.  Patient states she has not been feeling well since last week complaining of fever of 102, cough, shortness of breath and chest congestion.  On arrival to the ED, she was afebrile with blood pressure 145/56 mm Hg and pulse rate 66 beats/min.  O2 sats 98% on room air she was therefore placed on 3 L nasal cannula.  There were no focal neurological deficits; She was alert and oriented x4, and he did not demonstrate any memory deficits.  Initial labs revealed 2.3, sodium 134, glucose 257, unremarkable CBC, CK 91, UA negative for UTI chest x-ray showed no evidence of active cardiopulmonary process.  X-ray of the right knee showed no acute fracture or dislocation.  PAST MEDICAL HISTORY:   Past Medical History:  Diagnosis Date   Abdominal pain, left upper quadrant 12/11/2012   Arthritis     Asthma    Broken leg 2014   fell twice and broke same leg. had a bone stimulator for first break.   Cancer ([place]) 2007   melanoma, left ear and back of left leg, removed from back   Chronic kidney disease, stage 2 (mild)    Chronic lower back pain    Collagen vascular disease ([place])    Coronary artery disease    Diabetes mellitus without complication ([place])    Diabetic nephropathy associated with secondary diabetes mellitus ([place])    Flushing 12/11/2012   Gastroparesis    Gross hematuria 12/11/2012   Hepatic cirrhosis ([place])    Hyperlipidemia    Hypertension    Hypothyroidism    Hypothyroidism    IBS (irritable bowel syndrome)    Kidney stone 12/11/2012   Lower extremity edema    Morbid obesity with BMI of 40.0-44.9, adult ([place])    Myocardial infarction ([place]) 09/2013   Nausea without vomiting 12/11/2012   Nephrolithiasis    Nephrolithiasis 04/26/2014   Nonproliferative retinopathy due to secondary diabetes (H[facility])    Numbness and tingling of right leg    Peripheral vascular disease ([place])    Renal colic 01/75/1025   Renal insufficiency    Sciatica    Sciatica 12/11/2012   Skin cancer 08/2018   Sleep apnea    sleep study coming in november   Unilateral small kidney without contralateral hypertrophy     PAST SURGICAL HISTORY:  Past Surgical History:  Procedure Laterality Date   ABDOMINAL HYSTERECTOMY     BACK SURGERY  04/1999   L-4-5 LAMINECTOMY. metal in lower back and neck   CESAREAN SECTION  1980   CHOLECYSTECTOMY     COLONOSCOPY  05/04/2001   COLONOSCOPY WITH PROPOFOL N/A 09/20/2016   Procedure: COLONOSCOPY WITH PROPOFOL;  Surgeon: Lollie Sails, MD;  Location[facility] ENDOSCOPY;  Service: Endoscopy;  Laterality: N/A;   DILATION AND CURETTAGE OF UTERUS     ESOPHAGOGASTRODUODENOSCOPY  02/08/2014   ESOPHAGOGASTRODUODENOSCOPY (EGD) WITH PROPOFOL N/A 09/20/2016   Procedure: ESOPHAGOGASTRODUODENOSCOPY (EGD) WITH PROPOFOL;  Surgeon:  Lollie Sails, MD;  Location: [facility] ENDOSCOPY;  Service: Endoscopy;  Laterality: N/A;   ESOPHAGOGASTRODUODENOSCOPY (EGD) WITH PROPOFOL N/A 05/23/2018   Procedure: ESOPHAGOGASTRODUODENOSCOPY (EGD) WITH PROPOFOL;  Surgeon: Lollie Sails, MD;  Location: [facility] ENDOSCOPY;  Service: Endoscopy;  Laterality: N/A;   EYE SURGERY  1986   FOR MELANOMA   HERNIA REPAIR     JOINT REPLACEMENT Left 09/24/2013   TOTAL KNEE   MOHS SURGERY  11/2016   left side of nose   PULSE GENERATOR IMPLANT N/A 11/02/2017   Procedure: UNILATERAL PULSE GENERATOR IMPLANT;  Surgeon: Meade Maw, MD;  Location: [facility] ORS;  Service: Neurosurgery;  Laterality: N/A;   TONSILLECTOMY     WITH ADENOIDECTOMY    SOCIAL HISTORY:   Social History   Tobacco Use   Smoking status: Never Smoker   Smokeless tobacco: Never Used  Substance Use Topics   Alcohol use: No    FAMILY HISTORY:   Family History  Problem Relation Age of Onset   Heart disease Mother    Cancer Mother    Asthma Mother    Diabetes Father    Kidney disease Father    Hypertension Father    Breast cancer Neg Hx     DRUG ALLERGIES:   Allergies  Allergen Reactions   Eggs Or Egg-Derived Products Nausea And Vomiting    Only has reaction to flu vaccine due to eggs, no reaction to egg food products   Shellfish Allergy Nausea And Vomiting, Diarrhea and Nausea Only    Non stop sickness once ingests shellfish. Betadine is okay   Amlodipine Cough   Codeine Nausea And Vomiting and Nausea Only    states she can take codeine cough syrup without problems states she can take codeine cough syrup without problems   Imdur [Isosorbide Dinitrate] Other (See Comments)    Headache   Lyrica [Pregabalin] Swelling and Other (See Comments)    "FLU-LIKE" SYMPTOMS   Monosodium Glutamate Other (See Comments)    Headache   Tape Other (See Comments) and Nausea And Vomiting    can use paper tape Redness    REVIEW OF SYSTEMS:   Review  of Systems  Constitutional: Positive for chills and fever. Negative for malaise/fatigue and weight loss.  HENT: Negative for congestion, hearing loss and sore throat.   Eyes: Negative for blurred vision and double vision.  Respiratory: Positive for cough and shortness of breath. Negative for wheezing.   Cardiovascular: Positive for orthopnea and leg swelling. Negative for chest pain and palpitations.  Gastrointestinal: Negative for abdominal pain, diarrhea, nausea and vomiting.  Genitourinary: Negative for dysuria and urgency.  Musculoskeletal: Positive for back pain, falls and joint pain. Negative for myalgias.  Skin: Negative for rash.  Neurological: Negative for dizziness, sensory change, speech change, focal weakness and headaches.  Psychiatric/Behavioral: Negative for depression. The patient is nervous/anxious.    MEDICATIONS AT HOME:  Prior to Admission medications   Medication Sig Start Date End Date Taking? Authorizing Provider  albuterol (VENTOLIN HFA) 108 (90 Base) MCG/ACT inhaler TAKE 2 PUFFS BY MOUTH 3 TIMES A DAY AS NEEDED FOR SHORTNESS OF BREATH 05/10/16   [provider]  ALPRAZolam Duanne Moron) 0.5 MG tablet Take 0.5 mg by mouth 4 (four) times daily.  12/11/12   [provider]  bumetanide (BUMEX) 1 MG tablet Take by mouth daily.     [provider]  butalbital-acetaminophen-caffeine (FIORICET, ESGIC) 50-325-40 MG tablet TAKE ONE TABLET BY MOUTH ONCE DAILY 11/02/16   [provider]  candesartan (ATACAND) 32 MG tablet Take 1 tablet (32 mg total) by mouth daily. 02/22/19 02/22/20  Wilhelmina Mcardle, MD  carvedilol (COREG) 25 MG tablet Take 37.5-50 mg by mouth 2 (two) times daily with a meal. TAKES 2 TABLETS IN THE MORNING AND 1.5 TABLETS IN THE EVENING 03/25/17   [provider]  cloNIDine (CATAPRES - DOSED IN MG/24 HR) 0.3 mg/24hr patch Place 0.3 mg onto the skin once a week.     [provider]  cyclobenzaprine (FLEXERIL) 10 MG  tablet Take 20 mg by mouth at bedtime.    [provider]  diclofenac sodium (VOLTAREN) 1 % GEL Apply 4 g topically as needed (PAIN).  09/12/14   [provider]  doxazosin (CARDURA) 4 MG tablet Take 4 mg by mouth at bedtime.  12/11/12   [provider]  escitalopram (LEXAPRO) 10 MG tablet Take 10 mg by mouth daily.  12/13/12   [provider]  fluticasone (FLONASE) 50 MCG/ACT nasal spray PLACE 2 SPRAYS INTO BOTH NOSTRILS 2 (TWO) TIMES DAILY AS NEEDED FOR ALLERGIES 08/20/16   [provider]  fluticasone (FLOVENT HFA) 220 MCG/ACT inhaler TAKE 2 PUFFS BY MOUTH TWICE A DAY 05/22/19   Wilhelmina Mcardle, MD  gabapentin (NEURONTIN) 600 MG tablet Take 1 tablet (600 mg total) by mouth 6 (six) times daily. Patient taking differently: Take 600 mg by mouth See admin instructions.  01/18/17 05/17/19  Milinda Pointer, MD  gabapentin (NEURONTIN) 600 MG tablet Take 600 mg by mouth 3 (three) times daily. 06/06/19   [provider]  hydrALAZINE (APRESOLINE) 25 MG tablet Take 25 mg by mouth at bedtime.  03/30/16   [provider]  ibuprofen (ADVIL,MOTRIN) 800 MG tablet Take 800 mg by mouth 3 (three) times daily as needed.     [provider]  Insulin Detemir (LEVEMIR FLEXTOUCH) 100 UNIT/ML Pen 2 (two) times daily. 70 units in the am 70 units in evening 10/04/16   [provider]  Insulin Pen Needle (FIFTY50 PEN NEEDLES) 32G X 6 MM MISC 5 (five) times daily. 09/27/16   [provider]  insulin regular human CONCENTRATED (HUMULIN R) 500 UNIT/ML kwikpen Inject 80 Units into the skin 3 (three) times daily with meals. PER SLIDING SCALE 03/16/16   [provider]  ketoconazole (NIZORAL) 2 % shampoo APPLY TO AFFECTED AREAS (LEAVE ON FOR 5 MINUTES) , RINSE WELL, USE 2-3 TIMES PER WEEK. 10/10/17   [provider]  lactulose (CHRONULAC) 10 GM/15ML solution Take 10 g by mouth daily as needed for mild constipation.    [provider]  levothyroxine (SYNTHROID, LEVOTHROID) 150 MCG tablet Take 150 mcg by mouth daily before breakfast.  03/16/18   [provider]  lidocaine (LIDODERM) 5 % PLACE 1 PATCH ONTO THE MOST PAINFUL AREA OF SKIN DAILY FOR UP TO 12 HOURS IN A 24 HOUR PERIOD  10/22/16   [provider]  LINZESS 290 MCG CAPS capsule Take 290 mcg by mouth daily. 07/17/19   [provider]  lubiprostone (AMITIZA) 24 MCG capsule TAKE 1 CAPSULE (24 MCG TOTAL) BY MOUTH 2 (TWO) TIMES DAILY WITH MEALS. 09/18/18   [provider]  metoCLOPramide (REGLAN) 10 MG tablet Take 10 mg by mouth 3 (three) times daily. 12/11/12   [provider]  nitroGLYCERIN (NITRODUR - DOSED IN MG/24 HR) 0.4 mg/hr patch Place 1 patch onto the skin as needed. 06/12/18   [provider]  ondansetron (ZOFRAN) 4 MG tablet Take 4 mg by mouth as needed. 05/14/19   [provider]  oxyCODONE-acetaminophen (PERCOCET) 10-325 MG tablet Take 1 tablet by mouth 2 (two) times daily as needed for pain. Must last 30 days. 07/10/19 08/09/19  [name], MD  oxyCODONE-acetaminophen (PERCOCET) 10-325 MG tablet Take 1 tablet by mouth 2 (two) times daily as needed for pain. Must last 30 days. 08/09/19 09/08/19  [name], MD  pantoprazole (PROTONIX) 40 MG tablet TAKE 1 TABLET (40 MG TOTAL) BY MOUTH 2 (TWO) TIMES DAILY. TAKE ONE HOUR BEFORE A MEAL 08/22/15   [provider]  potassium chloride (K-DUR,KLOR-CON) 10 MEQ tablet TAKE 1 TABLET BY MOUTH ONCE A DAY 08/19/14   [provider]  pramipexole (MIRAPEX) 0.5 MG tablet TAKE 2 TABLETS BY MOUTH AT BEDTIME 11/16/16   [provider]  rosuvastatin (CRESTOR) 10 MG tablet Take 10 mg by mouth daily. 07/04/19 07/03/20  [provider]  sucralfate (CARAFATE) 1 g tablet Take 1 tablet by mouth daily. 07/06/18   [provider]  traMADol (ULTRAM) 50 MG tablet Take 2 tablets (100 mg total) by mouth every 8 (eight) hours. Patient not  taking: Reported on 05/17/2019 01/18/17 05/17/19  Milinda Pointer, MD  traMADol (ULTRAM) 50 MG tablet Take 50 mg by mouth 2 (two) times daily. 1 in am and 2 at bedtime 07/10/18   [provider]  traZODone (DESYREL) 150 MG tablet Take 150 mg by mouth at bedtime.  12/11/12   [provider]  zolpidem (AMBIEN) 10 MG tablet Take 10 mg by mouth at bedtime.  12/11/12   [provider]      VITAL SIGNS:  Blood pressure (!) 190/85, pulse 83, temperature 99.9 F (37.7 C), temperature source Oral, resp. rate 17, height 5\' 6"  (1.676 m), weight (!) 141.1 kg, SpO2 99 %.  PHYSICAL EXAMINATION:   Physical Exam  GENERAL:  68 y.o.-year-old patient lying in the bed with no acute distress.  EYES: Pupils equal, round, reactive to light and accommodation. No scleral icterus. Extraocular muscles intact.  HEENT: Head atraumatic, normocephalic. Oropharynx and nasopharynx clear.  NECK:  Supple, no jugular venous distention. No thyroid enlargement, no tenderness.  LUNGS: Normal breath sounds bilaterally, no wheezing, rales,rhonchi or crepitation. No use of accessory muscles of respiration.  CARDIOVASCULAR: S1, S2 normal. No murmurs, rubs, or gallops.  ABDOMEN: Soft, nontender, nondistended. Bowel sounds present. No organomegaly or mass.  EXTREMITIES: Bilateral pitting edema of the extremities, cyanosis, or clubbing. No rash or lesions. + pedal pulses MUSCULOSKELETAL: Normal bulk, and power was 5+ grip and elbow, knee, and ankle flexion and extension bilaterally.  NEUROLOGIC:Alert and oriented x 3. CN 2-12 intact. Sensation to light touch and cold stimuli intact bilaterally. DTR's (biceps, patellar, and achilles) 2+ and symmetric throughout. Gait not tested due to safety concern. PSYCHIATRIC: The patient is alert and oriented x 3.  SKIN: see below.  DATA REVIEWED:  LABORATORY PANEL:   CBC Recent Labs  Lab 07/30/19 1634  WBC 8.5  HGB 11.6*  HCT 37.0  PLT 172    ------------------------------------------------------------------------------------------------------------------  Chemistries  Recent Labs  Lab 07/30/19 1634  NA 134*  K 4.3  CL 95*  CO2 31  GLUCOSE 257*  BUN 10  CREATININE 0.76  CALCIUM 9.8  AST 39  ALT 34  ALKPHOS 125  BILITOT 0.5   ------------------------------------------------------------------------------------------------------------------  Cardiac Enzymes No results for input(s): TROPONINI in the last 168 hours. ------------------------------------------------------------------------------------------------------------------  RADIOLOGY:  Ct Head Wo Contrast  Result Date: 07/30/2019 CLINICAL DATA:  Fall EXAM: CT HEAD WITHOUT CONTRAST TECHNIQUE: Contiguous axial images were obtained from the base of the skull through the vertex without intravenous contrast. COMPARISON:  10/22/2014 FINDINGS: Brain: There is no mass, hemorrhage or extra-axial collection. The size and configuration of the ventricles and extra-axial CSF spaces are normal. There is hypoattenuation of the white matter, most commonly indicating chronic small vessel disease. Vascular: No abnormal hyperdensity of the major intracranial arteries or dural venous sinuses. No intracranial atherosclerosis. Skull: The visualized skull base, calvarium and extracranial soft tissues are normal. Sinuses/Orbits: No fluid levels or advanced mucosal thickening of the visualized paranasal sinuses. No mastoid or middle ear effusion. The orbits are normal. IMPRESSION: Chronic small vessel ischemia without acute intracranial abnormality. Electronically Signed   By: Ulyses Jarred M.D.   On: 07/30/2019 19:57   Dg Chest Port 1 View  Result Date: 07/30/2019 CLINICAL DATA:  Patient fell last night.  Instability. EXAM: PORTABLE CHEST 1 VIEW COMPARISON:  Radiographs 10/22/2014.  CT 05/25/2017. FINDINGS: 1544 hours. Suboptimal inspiration. The heart size and mediastinal contours are normal.  The lungs are clear. There is no pleural effusion or pneumothorax. No acute osseous findings are identified. Previous lower cervical fusion noted. IMPRESSION: No evidence of active cardiopulmonary process. Electronically Signed   By: Richardean Sale M.D.   On: 07/30/2019 16:09   Dg Knee Complete 4 Views Right  Result Date: 07/30/2019 CLINICAL DATA:  Right knee pain.  Status post fall. EXAM: RIGHT KNEE - COMPLETE 4+ VIEW COMPARISON:  X-ray of the right tibia and fibula October 22, 2014 FINDINGS: There is no acute fracture or dislocation. Degenerative joint changes with narrow femoral tibial joint space and osteophyte formation are noted. The soft tissues are normal. IMPRESSION: No acute fracture or dislocation noted. Degenerative joint changes of right knee. Electronically Signed   By: Abelardo Diesel M.D.   On: 07/30/2019 16:11    EKG:  EKG: normal EKG, normal sinus rhythm, unchanged from previous tracings. Vent. rate 83 BPM PR interval * ms QRS duration 83 ms QT/QTc 365/429 ms P-R-T axes 56 -15 47 IMPRESSION AND PLAN:   68 y.o. female  with pertinent past medical history of type 2 diabetes mellitus, hypothyroidism, hypertension, status post left total knee replacement, bilateral lymphedema, COPD, OSA on CPAP and 2 L O2 at bedtime, CKD stage II CAD, MI, and chronic lower back pain presenting to the ED with chief complaint of fall.  1. Fall -secondary to bilateral lower extremity weakness.  Patient with hx of bilateral lymphedema of lower legs, bilateral knee pain status post left knee replacement pending replacement of right knee - Admit MedSurg floor  - CT head negative for acute intracranial abnormality - X-ray of right knee with no acute fractures - Continue Bumex for lower extremity edema - [place] of right Lower extremity - PT/OT consult  2.  Fever of unknown etiology -Lactic acid  elevated -Normal WBC and pro calcitonin -UA negative for UTI -X-ray shows no active cardiopulmonary  process -Blood and urine cultures pending -Start empiric antibiotics with cefepime vancomycin may need to de-escalate abx if no convincing source of infection or no more fevers  3. Coronary Artery Disease  - ASA 81mg  PO daily - HTN, HLD, DM control as below  4. HLD  + Goal LDL<100 -Rosuvastatin 10mg  PO qhs  5.HTN + Goal BP <130/80 -Continue clonidine Coreg Avapro and hydralazine  6. DM: sugars have been well-controlled on current regimen  - Low intensity sliding scale insulin coverage *-Continue on Levemir  7. Diabetic gastroparesis -Continue Linzess, Reglan Amitiza  8. Hypothyroidism-continue Synthroid  9. Chronic low back pain -Patient transdermal patch, Flexeril and oxycodone for pain  10. OSA - On CPAP and oxygen at home   All the records are reviewed and case discussed with ED provider. Management plans discussed with the patient, family and they are in agreement.  CODE STATUS: FULL  TOTAL TIME TAKING CARE OF THIS PATIENT: 50 minutes.    on 07/30/2019 at 9:47 PM  Rufina Falco, DNP, FNP-BC [facility] Nurse Practitioner Between 7am to 6pm - Pager [phone]  After 6pm go to [email or web address] - password [email or web address] [facility]  Office  [phone]  CC: Primary care physician; Rusty Aus, MD

## 2019-07-30 NOTE — Progress Notes (Signed)
Pharmacy Antibiotic Note  Brittney [name] is a 68 y.o. female admitted on 07/30/2019 with sepsis.  Pharmacy has been consulted for Vancomycin and Cefepime dosing.  Plan: Patient received Vanc 1 gram and Cefepime 2 gm in ED and metronidazole x 1. -Will continue with Cefepime 2 gm IV q8h -Will order additional Vancomycin 1500mg  IV x 1 to be given after 1 gram for a total Loading dose of 2500 mg. Then continue with Vancomycin 1000 mg IV Q 12 hrs. Goal AUC 400-550. Expected AUC: 487 SCr used: 0.8 Patient on Bumex, watch renal fxn, f/u for source of sepsis, CXR negative    Height: 5\' 6"  (167.6 cm) Weight: (!) 311 lb (141.1 kg) IBW/kg (Calculated) : 59.3  Temp (24hrs), Avg:99.9 F (37.7 C), Min:99.9 F (37.7 C), Max:99.9 F (37.7 C)  Recent Labs  Lab 07/24/19 1316 07/30/19 1634  WBC 7.7 8.5  CREATININE  --  0.76  LATICACIDVEN  --  2.2*    Estimated Creatinine Clearance: 100.5 mL/min (by C-G formula based on SCr of 0.76 mg/dL).    Allergies  Allergen Reactions  . Eggs Or Egg-Derived Products Nausea And Vomiting    Only has reaction to flu vaccine due to eggs, no reaction to egg food products  . Shellfish Allergy Nausea And Vomiting, Diarrhea and Nausea Only    Non stop sickness once ingests shellfish. Betadine is okay  . Amlodipine Cough  . Codeine Nausea And Vomiting and Nausea Only    states she can take codeine cough syrup without problems states she can take codeine cough syrup without problems  . Imdur [Isosorbide Dinitrate] Other (See Comments)    Headache  . Lyrica [Pregabalin] Swelling and Other (See Comments)    "FLU-LIKE" SYMPTOMS  . Monosodium Glutamate Other (See Comments)    Headache  . Tape Other (See Comments) and Nausea And Vomiting    can use paper tape Redness    Antimicrobials this admission: Metronidazole x 1 8/3    Vancomycin  8/3 >>   Cefepime 8/3 >>  Dose adjustments this admission:    Microbiology results: 8/3 BCx: pending 8/3 UCx:  pending    Sputum:      MRSA PCR:    Thank you for allowing pharmacy to be a part of this patient's care.  Brittney [name] A 07/30/2019 8:20 PM

## 2019-07-30 NOTE — Progress Notes (Signed)
PHARMACY -  BRIEF ANTIBIOTIC NOTE   Pharmacy has received consult(s) for Vancomycin and Cefepime from an ED provider.  The patient's profile has been reviewed for ht/wt/allergies/indication/available labs.    One time order(s) placed by MD for Vancomycin and Cefepime  Further antibiotics/pharmacy consults should be ordered by admitting physician if indicated.                       Thank you, Jennafer Gladue A 07/30/2019  6:49 PM

## 2019-07-30 NOTE — ED Notes (Signed)
Pt had bowel movement. Pt cleaned up and repositioned in bed at this time

## 2019-07-30 NOTE — Progress Notes (Signed)
PHARMACIST - PHYSICIAN COMMUNICATION  CONCERNING:  Enoxaparin (Lovenox) for DVT Prophylaxis   RECOMMENDATION: Patient was prescribed enoxaprin 40mg  q24 hours for VTE prophylaxis.   Filed Weights   07/30/19 1531  Weight: (!) 311 lb (141.1 kg)    Body mass index is 50.2 kg/m.  Estimated Creatinine Clearance: 100.5 mL/min (by C-G formula based on SCr of 0.76 mg/dL).   Based on [place] patient is candidate for enoxaparin 40mg  every 12 hour dosing due to BMI being >40.  DESCRIPTION: Pharmacy has adjusted enoxaparin dose per [facility] policy.  Patient is now receiving enoxaparin 40mg  every 12 hours.    Pernell Dupre, PharmD, BCPS Clinical Pharmacist 07/30/2019 10:25 PM

## 2019-07-30 NOTE — ED Triage Notes (Signed)
Pt comes via [facility] from home with c/o of mechanical fall. Pt states that she was getting ready for bed last night around 11:30pm and that her legs gave out and she fell to the floor. Pt states no LOC, dizziness and she didn't hit her head. Pt states she thinks she may have a UTI. Pt also c/o fever, and chest congestion.  Pt laid on floor during night and was unable to get to phone. Pt found by [employer] and 911 called. Pt states she wears 3L of O2 at night.  Pt O2 at 84% RA, pt placed on 3L and O2 100%.

## 2019-07-31 ENCOUNTER — Encounter: Payer: Self-pay | Admitting: Student

## 2019-07-31 ENCOUNTER — Inpatient Hospital Stay: Payer: Medicare Other

## 2019-07-31 LAB — GLUCOSE, CAPILLARY
Glucose-Capillary: 210 mg/dL — ABNORMAL HIGH (ref 70–99)
Glucose-Capillary: 227 mg/dL — ABNORMAL HIGH (ref 70–99)
Glucose-Capillary: 239 mg/dL — ABNORMAL HIGH (ref 70–99)
Glucose-Capillary: 263 mg/dL — ABNORMAL HIGH (ref 70–99)
Glucose-Capillary: 300 mg/dL — ABNORMAL HIGH (ref 70–99)

## 2019-07-31 LAB — CREATININE, SERUM
Creatinine, Ser: 0.68 mg/dL (ref 0.44–1.00)
GFR calc Af Amer: >60 mL/min (ref >60)
GFR calc non Af Amer: >60 mL/min (ref >60)

## 2019-07-31 MED ORDER — TRAMADOL HCL 50 MG PO TABS
100.0000 mg | ORAL_TABLET | Freq: Two times a day (BID) | ORAL | Status: DC
  Administered 2019-07-31 – 2019-08-01 (×2): 100 mg via ORAL
  Filled 2019-07-31 (×2): qty 2

## 2019-07-31 MED ORDER — SODIUM CHLORIDE 0.9% FLUSH: 10.0000 mL | INTRAVENOUS | Status: DC | PRN

## 2019-07-31 MED ORDER — SODIUM CHLORIDE 0.9 % IV SOLN
INTRAVENOUS | Status: DC | PRN
  Administered 2019-07-31: 15 mL via INTRAVENOUS

## 2019-07-31 MED ORDER — LOPERAMIDE HCL 2 MG PO CAPS
2.0000 mg | ORAL_CAPSULE | ORAL | Status: DC | PRN
  Administered 2019-08-01: 2 mg via ORAL
  Filled 2019-07-31: qty 1

## 2019-07-31 MED ORDER — FUROSEMIDE 10 MG/ML IJ SOLN
40.0000 mg | Freq: Two times a day (BID) | INTRAMUSCULAR | Status: DC
  Filled 2019-07-31: qty 4

## 2019-07-31 MED ORDER — BUTALBITAL-APAP-CAFFEINE 50-325-40 MG PO TABS: 1.0000 | ORAL_TABLET | Freq: Every day | ORAL | Status: DC | PRN

## 2019-07-31 NOTE — Progress Notes (Addendum)
[facility] [place] NAME: Brittney [name]    MR#:  [id number]  DATE OF BIRTH:  Nov 27, 1951  SUBJECTIVE:  CHIEF COMPLAINT:   Chief Complaint  Patient presents with  . Fall  Feeling weak and tired REVIEW OF SYSTEMS:  Review of Systems  Constitutional: Positive for malaise/fatigue. Negative for diaphoresis, fever and weight loss.  HENT: Negative for ear discharge, ear pain, hearing loss, nosebleeds, sore throat and tinnitus.   Eyes: Negative for blurred vision and pain.  Respiratory: Negative for cough, hemoptysis, shortness of breath and wheezing.   Cardiovascular: Negative for chest pain, palpitations, orthopnea and leg swelling.  Gastrointestinal: Negative for abdominal pain, blood in stool, constipation, diarrhea, heartburn, nausea and vomiting.  Genitourinary: Negative for dysuria, frequency and urgency.  Musculoskeletal: Positive for falls. Negative for back pain and myalgias.  Skin: Negative for itching and rash.  Neurological: Negative for dizziness, tingling, tremors, focal weakness, seizures, weakness and headaches.  Psychiatric/Behavioral: Negative for depression. The patient is not nervous/anxious.     DRUG ALLERGIES:   Allergies  Allergen Reactions  . Eggs Or Egg-Derived Products Nausea And Vomiting    Only has reaction to flu vaccine due to eggs, no reaction to egg food products  . Shellfish Allergy Nausea And Vomiting, Diarrhea and Nausea Only    Non stop sickness once ingests shellfish. Betadine is okay  . Amlodipine Cough  . Codeine Nausea And Vomiting and Nausea Only    states she can take codeine cough syrup without problems states she can take codeine cough syrup without problems  . Imdur [Isosorbide Dinitrate] Other (See Comments)    Headache  . Lyrica [Pregabalin] Swelling and Other (See Comments)    "FLU-LIKE" SYMPTOMS  . Monosodium Glutamate Other (See Comments)    Headache  . Tape Other (See  Comments) and Nausea And Vomiting    can use paper tape Redness   VITALS:  Blood pressure 109/84, pulse 73, temperature 99.9 F (37.7 C), temperature source Oral, resp. rate 16, height 5\' 6"  (1.676 m), weight (!) 141.1 kg, SpO2 99 %. PHYSICAL EXAMINATION:  Physical Exam HENT:     Head: Normocephalic and atraumatic.  Eyes:     Conjunctiva/sclera: Conjunctivae normal.     Pupils: Pupils are equal, round, and reactive to light.  Neck:     Musculoskeletal: Normal range of motion and neck supple.     Thyroid: No thyromegaly.     Trachea: No tracheal deviation.  Cardiovascular:     Rate and Rhythm: Normal rate and regular rhythm.     Heart sounds: Normal heart sounds.  Pulmonary:     Effort: Pulmonary effort is normal. No respiratory distress.     Breath sounds: Normal breath sounds. No wheezing.  Chest:     Chest wall: No tenderness.  Abdominal:     General: Bowel sounds are normal. There is no distension.     Palpations: Abdomen is soft.     Tenderness: There is no abdominal tenderness.  Musculoskeletal: Normal range of motion.     Left lower leg: Edema present.  Skin:    General: Skin is warm and dry.     Findings: No rash.  Neurological:     Mental Status: She is alert and oriented to person, place, and time.     Cranial Nerves: No cranial nerve deficit.    LABORATORY PANEL:  Female CBC Recent Labs  Lab 07/30/19 [phone]  WBC 8.5  HGB 11.6*  HCT 37.0  PLT 172   ------------------------------------------------------------------------------------------------------------------ Chemistries  Recent Labs  Lab 07/30/19 1634 07/31/19 0513  NA 134*  --   K 4.3  --   CL 95*  --   CO2 31  --   GLUCOSE 257*  --   BUN 10  --   CREATININE 0.76 0.68  CALCIUM 9.8  --   AST 39  --   ALT 34  --   ALKPHOS 125  --   BILITOT 0.5  --    RADIOLOGY:  Ct Head Wo Contrast  Result Date: 07/30/2019 CLINICAL DATA:  Fall EXAM: CT HEAD WITHOUT CONTRAST TECHNIQUE: Contiguous axial  images were obtained from the base of the skull through the vertex without intravenous contrast. COMPARISON:  10/22/2014 FINDINGS: Brain: There is no mass, hemorrhage or extra-axial collection. The size and configuration of the ventricles and extra-axial CSF spaces are normal. There is hypoattenuation of the white matter, most commonly indicating chronic small vessel disease. Vascular: No abnormal hyperdensity of the major intracranial arteries or dural venous sinuses. No intracranial atherosclerosis. Skull: The visualized skull base, calvarium and extracranial soft tissues are normal. Sinuses/Orbits: No fluid levels or advanced mucosal thickening of the visualized paranasal sinuses. No mastoid or middle ear effusion. The orbits are normal. IMPRESSION: Chronic small vessel ischemia without acute intracranial abnormality. Electronically Signed   By: Ulyses Jarred M.D.   On: 07/30/2019 19:57   US Venous Img Lower Unilateral Right  Result Date: 07/31/2019 CLINICAL DATA:  Diffuse right leg pain for several months EXAM: RIGHT LOWER EXTREMITY VENOUS DOPPLER ULTRASOUND TECHNIQUE: Gray-scale sonography with graded compression, as well as color Doppler and duplex ultrasound were performed to evaluate the lower extremity deep venous systems from the level of the common femoral vein and including the common femoral, femoral, profunda femoral, popliteal and calf veins including the posterior tibial, peroneal and gastrocnemius veins when visible. The superficial great saphenous vein was also interrogated. Spectral Doppler was utilized to evaluate flow at rest and with distal augmentation maneuvers in the common femoral, femoral and popliteal veins. COMPARISON:  None. FINDINGS: Contralateral Common Femoral Vein: Respiratory phasicity is normal and symmetric with the symptomatic side. No evidence of thrombus. Normal compressibility. Common Femoral Vein: No evidence of thrombus. Saphenofemoral Junction: No evidence of thrombus.  Profunda Femoral Vein: No evidence of thrombus. Femoral Vein: No evidence of thrombus. Popliteal Vein: No evidence of thrombus. Calf Veins: No evidence of thrombus. IMPRESSION: No evidence of right lower extremity deep venous thrombosis. Electronically Signed   By: Monte Fantasia M.D.   On: 07/31/2019 04:19   Dg Chest Port 1 View  Result Date: 07/30/2019 CLINICAL DATA:  Patient fell last night.  Instability. EXAM: PORTABLE CHEST 1 VIEW COMPARISON:  Radiographs 10/22/2014.  CT 05/25/2017. FINDINGS: 1544 hours. Suboptimal inspiration. The heart size and mediastinal contours are normal. The lungs are clear. There is no pleural effusion or pneumothorax. No acute osseous findings are identified. Previous lower cervical fusion noted. IMPRESSION: No evidence of active cardiopulmonary process. Electronically Signed   By: Richardean Sale M.D.   On: 07/30/2019 16:09   Dg Knee Complete 4 Views Right  Result Date: 07/30/2019 CLINICAL DATA:  Right knee pain.  Status post fall. EXAM: RIGHT KNEE - COMPLETE 4+ VIEW COMPARISON:  X-ray of the right tibia and fibula October 22, 2014 FINDINGS: There is no acute fracture or dislocation. Degenerative joint changes with narrow femoral tibial joint space and osteophyte formation are noted. The  soft tissues are normal. IMPRESSION: No acute fracture or dislocation noted. Degenerative joint changes of right knee. Electronically Signed   By: Abelardo Diesel M.D.   On: 07/30/2019 16:11   ASSESSMENT AND PLAN:  68 y.o. female  with pertinent past medical history of type 2 diabetes mellitus, hypothyroidism, hypertension, status post left total knee replacement, bilateral lymphedema, COPD, OSA on CPAP and 2 L O2 at bedtime, CKD stage II CAD, MI, and chronic lower back pain presenting to the ED with chief complaint of fall.  1. Fall -secondary to bilateral lower extremity weakness.  Patient with hx of bilateral lymphedema of lower legs, bilateral knee pain status post left knee  replacement pending replacement of right knee - CT head negative for acute intracranial abnormality - X-ray of right knee with no acute fractures - [place] of right Lower extremity - no DVT - PT/OT consult  2.  Fever of unknown etiology -Lactic acid elevated -Normal WBC and pro calcitonin -UA negative for UTI -X-ray shows no active cardiopulmonary process -Blood and urine cultures pending -Continue empiric antibiotics with cefepime vancomycin may need to de-escalate abx if no convincing source of infection or no more fevers  3. Coronary Artery Disease  - ASA 81mg  PO daily - HTN, HLD, DM control as below  4. HLD  + Goal LDL<100 -Rosuvastatin 10mg  PO qhs  5.HTN + Goal BP <130/80 -Continue clonidine Coreg Avapro and hydralazine  6. DM: sugars have been well-controlled on current regimen  - Low intensity sliding scale insulin coverage -Continue on Levemir that she will consider outpatient visit with Dr. Gabriel Carina to decide on insulin pump  7. Diabetic gastroparesis -Continue Linzess, Reglan Amitiza  8. Hypothyroidism-continue Synthroid  9. Chronic low back pain -Patient transdermal patch, Flexeril and oxycodone for pain  10. OSA - On CPAP and oxygen at home  11.  Lower extremity edema: -Seems more of chronic lymphedema although could have some more acute worsening -We will hold off Bumex and start on IV Lasix 40 twice daily and monitor strict I's and O's and daily weights    All the records are reviewed and case discussed with Care Management/Social Worker. Management plans discussed with the patient, nursing and they are in agreement.  CODE STATUS: Full Code  TOTAL TIME TAKING CARE OF THIS PATIENT: 35 minutes.   More than 50% of the time was spent in counseling/coordination of care: YES  POSSIBLE D/C IN 1-2 DAYS, DEPENDING ON CLINICAL CONDITION.   Max Sane M.D on 07/31/2019 at 2:16 PM  Between 7am to 6pm - Pager - [phone]  After 6pm go to [email or web address] -  Proofreader  [facility]  Office  [phone]  CC: Primary care physician; Rusty Aus, MD  Note: This dictation was prepared with Dragon dictation along with smaller phrase technology. Any transcriptional errors that result from this process are unintentional.

## 2019-07-31 NOTE — ED Notes (Signed)
Patient is resting comfortably. 

## 2019-07-31 NOTE — TOC Initial Note (Signed)
Transition of Care [facility]) - Initial/Assessment Note    Patient Details  Name: Brittney [name] MRN: [id number] Date of Birth: October 13, 1951  Transition of [facility]) CM/SW Contact:    Shelbie Hutching, RN Phone Number: 07/31/2019, 4:06 PM  Clinical Narrative:                 Patient admitted with sepsis from UTI.  Patient fell at home and laid on the floor for 12 hours before a meals on wheels driver found her.  Patient reports that she lives alone, her daughter lives about 4 miles away.  Patient has a caregiver that comes in every day for 3 hours and 45 min except for one day a week she is only there 2 hours and 45 min.  Caregiver is from [place] paid for by Medicaid.  Patient also gets meals on wheels.  Patient can get around in the home with a walker, she reports she has a regular rolling walker and a rollator, pt also has a shower chair.  Information given to patient for Med Alert, a personal emergency response system provided for residents of [facility] for a small monthly fee.  Patient uses CPAP at night.  RNCM will cont to follow patient and assess for discharge needs.  Patient would benefit from PT consult.   Expected Discharge Plan: [place] Barriers to Discharge: Continued Medical Work up   Patient Goals and CMS Choice Patient states their goals for this hospitalization and ongoing recovery are:: does not want to take anymore antibiotics and wants to go home with a good attitude and stronger legs.      Expected Discharge Plan and Services Expected Discharge Plan: [place]   Discharge Planning Services: CM Consult   Living arrangements for the past 2 months: Single Family Home                                      Prior Living Arrangements/Services Living arrangements for the past 2 months: Single Family Home Lives with:: Self Patient language and need for interpreter reviewed:: No Do you feel safe going back to  the place where you live?: Yes      Need for Family Participation in Patient Care: Yes (Comment)(impaired mobility) Care giver support system in place?: Yes (comment)(daughter) Current home services: DME, Homehealth aide(3 walkers and shower chair) Criminal Activity/Legal Involvement Pertinent to Current Situation/Hospitalization: No - Comment as needed  Activities of Daily Living Home Assistive Devices/Equipment: None ADL Screening (condition at time of admission) Patient's cognitive ability adequate to safely complete daily activities?: Yes Is the patient deaf or have difficulty hearing?: No Does the patient have difficulty seeing, even when wearing glasses/contacts?: No Does the patient have difficulty concentrating, remembering, or making decisions?: No Patient able to express need for assistance with ADLs?: Yes Does the patient have difficulty dressing or bathing?: Yes Independently performs ADLs?: No Communication: Independent Dressing (OT): Needs assistance Is this a change from baseline?: Change from baseline, expected to last >3 days Grooming: Needs assistance Is this a change from baseline?: Change from baseline, expected to last >3 days Feeding: Independent Bathing: Needs assistance Is this a change from baseline?: Change from baseline, expected to last >3 days Toileting: Needs assistance Is this a change from baseline?: Change from baseline, expected to last >3days In/Out Bed: Needs assistance Is this a change from  baseline?: Change from baseline, expected to last >3 days Walks in Home: Needs assistance Is this a change from baseline?: Change from baseline, expected to last >3 days Does the patient have difficulty walking or climbing stairs?: Yes Weakness of Legs: Both Weakness of Arms/Hands: None  Permission Sought/Granted Permission sought to share information with : Case Manager Permission granted to share information with : Yes, Verbal Permission Granted               Emotional Assessment Appearance:: Appears stated age Attitude/Demeanor/Rapport: Engaged Affect (typically observed): Accepting Orientation: : Oriented to Self, Oriented to Place, Oriented to  Time, Oriented to Situation Alcohol / Substance Use: Not Applicable Psych Involvement: No (comment)  Admission diagnosis:  Weakness [R53.1] Fall, initial encounter [W19.XXXA] Lower extremity pain, diffuse, right [M79.604] Patient Active Problem List   Diagnosis Date Noted  . Sepsis ([place]) 07/30/2019  . Lymphedema 09/03/2018  . Chronic venous insufficiency 09/03/2018  . Status post lumbar spine surgery for decompression of spinal cord 03/30/2018  . Myofascial pain 03/30/2018  . Spinal stenosis of thoracic region 03/30/2018  . Back pain 11/02/2017  . Iron deficiency anemia 05/10/2017  . Long term prescription benzodiazepine use 01/18/2017  . Vitamin D insufficiency 01/18/2017  . Neurogenic pain 01/17/2017  . L3-4 severe lumbar facet hypertrophy and spinal stenosis 01/10/2017  . Diabetes with retinopathy ([place]) 12/07/2016  . Diabetic nephropathy ([place]) 12/07/2016  . Gastroparesis due to DM ([place]) 12/07/2016  . Long term current use of opiate analgesic 12/07/2016  . Long term prescription opiate use 12/07/2016  . Opiate use 12/07/2016  . Chronic pain syndrome 12/07/2016  . Chronic low back pain (Location of Primary Source of Pain) (Bilateral) (R>L) 12/07/2016  . 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 Chronic lower extremity pain (Location of Secondary source of pain) (Bilateral) (R>L) 12/07/2016  . Chronic knee pain (Location of Tertiary source of pain) (Right) 12/07/2016  . Osteoarthritis of knee (Right) 12/07/2016  . Grade 1 Anterolisthesis of L3 over L4 and L4 over L5 12/07/2016  . Osteoarthritis of sacroiliac joint (Right) 12/07/2016  . Generalized osteoarthritis of multiple sites 10/22/2016  . Degenerative spondylolisthesis 09/08/2016  . L3-4 severe lumbar spinal  stenosis (12/31/2016 MRI) 09/08/2016  . Lung nodule, multiple 08/31/2016  . Hepatic cirrhosis ([place]) 06/20/2016  . Fibromyalgia 04/15/2016  . Seronegative arthritis 04/15/2016  . Diabetes mellitus with peripheral vascular disease ([place]) 03/16/2016  . Type 2 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, with long-term current use of insulin ([place]) 03/16/2016  . Morbid obesity with BMI of 40.0-44.9, adult ([place]) 03/16/2016  . Type 2 diabetes mellitus with diabetic nephropathy, with long-term current use of insulin ([place]) 03/16/2016  . Microalbuminuria 02/20/2016  . Essential hypertension 01/27/2016  . DM (diabetes mellitus) type II uncontrolled, periph vascular disorder ([place]) 04/26/2014  . Hyperlipemia 04/26/2014  . OSA (obstructive sleep apnea) 04/26/2014  . Abnormal tumor markers 12/11/2012  . Chronic kidney disease, stage II (mild) 12/11/2012  . Unilateral small kidney 12/11/2012   PCP:  Rusty Aus, MD Pharmacy:   [id number] [place], [address] [place] [address] Phone: [phone] Fax: [phone]     Social Determinants of Health (SDOH) Interventions    Readmission Risk Interventions No flowsheet data found.

## 2019-07-31 NOTE — Progress Notes (Signed)
Inpatient Diabetes Program Recommendations  AACE/ADA: New Consensus Statement on Inpatient Glycemic Control   Target Ranges:  Prepandial:   less than 140 mg/dL      Peak postprandial:   less than 180 mg/dL (1-2 hours)      Critically ill patients:  140 - 180 mg/dL   Results for Brittney [name], Brittney "PAM" (MRN [id number]) as of 07/31/2019 10:40  Ref. Range 07/30/2019 22:05 07/31/2019 00:39 07/31/2019 09:31  Glucose-Capillary Latest Ref Range: 70 - 99 mg/dL 253 (H) 263 (H) 239 (H)   Review of Glycemic Control  Diabetes history: DM2 Outpatient Diabetes medications: Levemir 140-170 units 1 to 2 times a day, Humulin R U500 40-80 units 2 to 3 times per day Current orders for Inpatient glycemic control: Levemir 70 units BID, Novolog 0-9 units TID with meals, Novolog 0-5 units QHS  NOTE: Spoke with patient about diabetes and home regimen for diabetes control. Patient is followed by Dr. Gabriel Carina (last seen 07/03/19) for diabetes management and currently taking Levemir 140-170 units 1 to 2 times a day, Humulin R U500 40-80 units 2 to 3 times per day as an outpatient for diabetes control. Patient reports that she takes Levemir 140-170 units QAM consistently but if glucose is less than 150 mg/dl and she "did not eat any illegal that I shouldn't be eating" then I don't take the bedtime dose of Levemir.  Patient states that she eats 2-3 times per day and she takes Humulin R U500 with meals and dose depends on glucose reading. Patient notes that if glucose is less than 150 mg/dl she does not take Humulin R U500 insulin.  Patient reports checking glucose 4-5 times per day and notes that she rarely has any hypoglycemia. Patient states that she is not currently using any glucose monitoring sensors (such as FreeStyle) because her insurance will not cover it.  Discussed current insulin orders for Levemir and Novolog correction. Patient states that she most likely will be fine with insulin as ordered especially since her diet is more  controlled in the hospital.  Patient stated that she has been talking with Dr. Gabriel Carina about going on an insulin pump and she inquired about Medtronic insulin pump. Discussed insulin pumps in general as to how they work, when insulin reservoir and tubing are generally changed, and how continuous glucose monitoring (CGM) sensors function with insulin pumps. Discussed Medtronic insulin pump and Guardian CGM sensor.  Patient appreciative of information discussed and she is looking forward to working with her Endocrinologist to get on an insulin pump soon.  Patient verbalized understanding of information discussed and reports no further questions at this time related to diabetes. Will continue to follow along and make recommendations if needed.  Thanks, Barnie Alderman, RN, MSN, CDE Diabetes Coordinator Inpatient Diabetes Program [phone] (Team Pager)

## 2019-07-31 NOTE — ED Notes (Signed)
AAOx3.  Skin warm and dry.  NAD 

## 2019-07-31 NOTE — ED Notes (Signed)
Pt asked that synthroid not be given at 0600 and pt to be allowed to sleep.

## 2019-07-31 NOTE — ED Notes (Signed)
ED TO INPATIENT HANDOFF REPORT  ED Nurse Name and Phone #: Opal Sidles [phone]  S Name/Age/Gender [name] 68 y.o. female Room/Bed: ED15A/ED15A  Code Status   Code Status: Full Code  Home/SNF/Other Home Patient oriented to: self, place, time and situation Is this baseline? Yes   Triage Complete: Triage complete  Chief Complaint fall  Triage Note Pt comes via [facility] from home with c/o of mechanical fall. Pt states that she was getting ready for bed last night around 11:30pm and that her legs gave out and she fell to the floor. Pt states no LOC, dizziness and she didn't hit her head. Pt states she thinks she may have a UTI. Pt also c/o fever, and chest congestion.  Pt laid on floor during night and was unable to get to phone. Pt found by [employer] and 911 called. Pt states she wears 3L of O2 at night.  Pt O2 at 84% RA, pt placed on 3L and O2 100%.   Allergies Allergies  Allergen Reactions  . Eggs Or Egg-Derived Products Nausea And Vomiting    Only has reaction to flu vaccine due to eggs, no reaction to egg food products  . Shellfish Allergy Nausea And Vomiting, Diarrhea and Nausea Only    Non stop sickness once ingests shellfish. Betadine is okay  . Amlodipine Cough  . Codeine Nausea And Vomiting and Nausea Only    states she can take codeine cough syrup without problems states she can take codeine cough syrup without problems  . Imdur [Isosorbide Dinitrate] Other (See Comments)    Headache  . Lyrica [Pregabalin] Swelling and Other (See Comments)    "FLU-LIKE" SYMPTOMS  . Monosodium Glutamate Other (See Comments)    Headache  . Tape Other (See Comments) and Nausea And Vomiting    can use paper tape Redness    Level of Care/Admitting Diagnosis ED Disposition    ED Disposition Condition [facility]: Long [100120]  Level of Care: Med-Surg [16]  Covid Evaluation: Person Under Investigation (PUI)  Diagnosis:  Sepsis [facility]) [[id number]]  Admitting Physician: Eula Flax  Attending Physician: Rufina Falco ACHIENG [[id number]]  Estimated length of stay: past midnight tomorrow  Certification:: I certify this patient will need inpatient services for at least 2 midnights  PT Class (Do Not Modify): Inpatient [101]  PT Acc Code (Do Not Modify): Private [1]       B Medical/Surgery History Past Medical History:  Diagnosis Date  . Abdominal pain, left upper quadrant 12/11/2012  . Arthritis   . Asthma   . Broken leg 2014   fell twice and broke same leg. had a bone stimulator for first break.  . Cancer ([place]) 2007   melanoma, left ear and back of left leg, removed from back  . Chronic kidney disease, stage 2 (mild)   . Chronic lower back pain   . Collagen vascular disease ([place])   . Coronary artery disease   . Diabetes mellitus without complication ([place])   . Diabetic nephropathy associated with secondary diabetes mellitus ([place])   . [place] 12/11/2012  . Gastroparesis   . Gross hematuria 12/11/2012  . Hepatic cirrhosis ([place])   . Hyperlipidemia   . Hypertension   . Hypothyroidism   . Hypothyroidism   . IBS (irritable bowel syndrome)   . Kidney stone 12/11/2012  . Lower extremity edema   . Morbid obesity with BMI of 40.0-44.9, adult ([place])   . Myocardial  infarction ([place]) 09/2013  . Nausea without vomiting 12/11/2012  . Nephrolithiasis   . Nephrolithiasis 04/26/2014  . Nonproliferative retinopathy due to secondary diabetes ([place])   . Numbness and tingling of right leg   . Peripheral vascular disease ([place])   . Renal colic 56/43/3295  . Renal insufficiency   . Sciatica   . Sciatica 12/11/2012  . Skin cancer 08/2018  . Sleep apnea    sleep study coming in november  . Unilateral small kidney without contralateral hypertrophy    Past Surgical History:  Procedure Laterality Date  . ABDOMINAL HYSTERECTOMY    . BACK SURGERY  04/1999   L-4-5 LAMINECTOMY. metal in lower back  and neck  . CESAREAN SECTION  1980  . CHOLECYSTECTOMY    . COLONOSCOPY  05/04/2001  . COLONOSCOPY WITH PROPOFOL N/A 09/20/2016   Procedure: COLONOSCOPY WITH PROPOFOL;  Surgeon: Lollie Sails, MD;  Location: [facility] ENDOSCOPY;  Service: Endoscopy;  Laterality: N/A;  . DILATION AND CURETTAGE OF UTERUS    . ESOPHAGOGASTRODUODENOSCOPY  02/08/2014  . ESOPHAGOGASTRODUODENOSCOPY (EGD) WITH PROPOFOL N/A 09/20/2016   Procedure: ESOPHAGOGASTRODUODENOSCOPY (EGD) WITH PROPOFOL;  Surgeon: Lollie Sails, MD;  Location: [facility] ENDOSCOPY;  Service: Endoscopy;  Laterality: N/A;  . ESOPHAGOGASTRODUODENOSCOPY (EGD) WITH PROPOFOL N/A 05/23/2018   Procedure: ESOPHAGOGASTRODUODENOSCOPY (EGD) WITH PROPOFOL;  Surgeon: Lollie Sails, MD;  Location: [facility] ENDOSCOPY;  Service: Endoscopy;  Laterality: N/A;  . EYE SURGERY  1986   FOR MELANOMA  . HERNIA REPAIR    . JOINT REPLACEMENT Left 09/24/2013   TOTAL KNEE  . MOHS SURGERY  11/2016   left side of nose  . PULSE GENERATOR IMPLANT N/A 11/02/2017   Procedure: UNILATERAL PULSE GENERATOR IMPLANT;  Surgeon: Meade Maw, MD;  Location: [facility] ORS;  Service: Neurosurgery;  Laterality: N/A;  . TONSILLECTOMY     WITH ADENOIDECTOMY     A IV Location/Drains/Wounds Patient Lines/Drains/Airways Status   Active Line/Drains/Airways    Name:   Placement date:   Placement time:   Site:   Days:   Peripheral IV 07/30/19 Right Arm   07/30/19    1643    Arm   1   External Urinary Catheter   07/30/19    1638    -   1   Incision (Closed) 11/02/17 Back   11/02/17    0938     636   Incision (Closed) 11/02/17 Buttocks Left;Posterior   11/02/17    -     636          Intake/Output Last 24 hours  Intake/Output Summary (Last 24 hours) at 07/31/2019 1884 Last data filed at 07/31/2019 0137 Gross per 24 hour  Intake 1600 ml  Output 1600 ml  Net 0 ml    Labs/Imaging Results for orders placed or performed during the hospital encounter of 07/30/19 (from the past 48 hour(s))   Lactic acid, plasma     Status: Abnormal   Collection Time: 07/30/19  4:34 PM  Result Value Ref Range   Lactic Acid, Venous 2.2 (HH) 0.5 - 1.9 mmol/L    Comment: CRITICAL RESULT CALLED TO, READ BACK BY AND VERIFIED WITH RAQUEL DAVID RN AT 2119 07/30/2019 [facility] Performed at [facility], [address] [place], [place] [address]   Lactic acid, plasma     Status: Abnormal   Collection Time: 07/30/19  4:34 PM  Result Value Ref Range   Lactic Acid, Venous 2.2 (HH) 0.5 - 1.9 mmol/L    Comment: CRITICAL RESULT CALLED  TO, READ BACK BY AND VERIFIED WITH SAM HAMILTON RN AT 0539 ON 07/30/2019 [facility] Performed at [facility], [address] [place], [place] [address]   Comprehensive metabolic panel     Status: Abnormal   Collection Time: 07/30/19  4:34 PM  Result Value Ref Range   Sodium 134 (L) 135 - 145 mmol/L   Potassium 4.3 3.5 - 5.1 mmol/L   Chloride 95 (L) 98 - 111 mmol/L   CO2 31 22 - 32 mmol/L   Glucose, Bld 257 (H) 70 - 99 mg/dL   BUN 10 8 - 23 mg/dL   Creatinine, Ser 0.76 0.44 - 1.00 mg/dL   Calcium 9.8 8.9 - 10.3 mg/dL   Total Protein 7.2 6.5 - 8.1 g/dL   Albumin 3.3 (L) 3.5 - 5.0 g/dL   AST 39 15 - 41 U/L   ALT 34 0 - 44 U/L   Alkaline Phosphatase 125 38 - 126 U/L   Total Bilirubin 0.5 0.3 - 1.2 mg/dL   GFR calc non Af Amer >60 >60 mL/min   GFR calc Af Amer >60 >60 mL/min   Anion gap 8 5 - 15    Comment: Performed at [facility], [address] [place], [place] [address]  CBC WITH DIFFERENTIAL     Status: Abnormal   Collection Time: 07/30/19  4:34 PM  Result Value Ref Range   WBC 8.5 4.0 - 10.5 K/uL   RBC 4.37 3.87 - 5.11 MIL/uL   Hemoglobin 11.6 (L) 12.0 - 15.0 g/dL   HCT 37.0 36.0 - 46.0 %   MCV 84.7 80.0 - 100.0 fL   MCH 26.5 26.0 - 34.0 pg   MCHC 31.4 30.0 - 36.0 g/dL   RDW 13.9 11.5 - 15.5 %   Platelets 172 150 - 400 K/uL   nRBC 0.0 0.0 - 0.2 %   Neutrophils Relative % 79 %   Neutro Abs 6.7 1.7 - 7.7 K/uL   Lymphocytes Relative 14  %   Lymphs Abs 1.2 0.7 - 4.0 K/uL   Monocytes Relative 5 %   Monocytes Absolute 0.5 0.1 - 1.0 K/uL   Eosinophils Relative 1 %   Eosinophils Absolute 0.1 0.0 - 0.5 K/uL   Basophils Relative 1 %   Basophils Absolute 0.1 0.0 - 0.1 K/uL   Immature Granulocytes 0 %   Abs Immature Granulocytes 0.03 0.00 - 0.07 K/uL    Comment: Performed at [facility], [address] [place], [place] [address]  APTT     Status: None   Collection Time: 07/30/19  4:34 PM  Result Value Ref Range   aPTT 33 24 - 36 seconds    Comment: Performed at [facility], [address] [place], [place] [address]  Protime-INR     Status: None   Collection Time: 07/30/19  4:34 PM  Result Value Ref Range   Prothrombin Time 13.2 11.4 - 15.2 seconds   INR 1.0 0.8 - 1.2    Comment: (NOTE) INR goal varies based on device and disease states. Performed at [facility], [address] [place], [place] [address]   CK     Status: None   Collection Time: 07/30/19  4:34 PM  Result Value Ref Range   Total CK 91 38 - 234 U/L    Comment: Performed at [facility], [address] [place], [place] [address]  Urinalysis, Routine w reflex microscopic     Status: Abnormal   Collection Time: 07/30/19  4:40 PM  Result Value Ref Range   Color, Urine YELLOW (A) YELLOW   APPearance CLEAR (A) CLEAR   Specific Gravity, Urine 1.011 1.005 - 1.030   pH 8.0 5.0 - 8.0   Glucose, UA >=500 (A) NEGATIVE mg/dL   Hgb urine dipstick NEGATIVE NEGATIVE   Bilirubin Urine NEGATIVE NEGATIVE   Ketones, ur NEGATIVE NEGATIVE mg/dL   Protein, ur 30 (A) NEGATIVE mg/dL   Nitrite NEGATIVE NEGATIVE   Leukocytes,Ua NEGATIVE NEGATIVE   WBC, UA 0-5 0 - 5 WBC/hpf   Bacteria, UA NONE SEEN NONE SEEN   Squamous Epithelial / LPF 0-5 0 - 5    Comment: Performed at [facility], [address], [place], [place] [address]  SARS Coronavirus 2 [facility] order, Performed in [facility] hospital lab) Nasopharyngeal  Nasopharyngeal Swab     Status: None   Collection Time: 07/30/19  4:53 PM   Specimen: Nasopharyngeal Swab  Result Value Ref Range   SARS Coronavirus 2 NEGATIVE NEGATIVE    Comment: (NOTE) If result is NEGATIVE SARS-CoV-2 target nucleic acids are NOT DETECTED. The SARS-CoV-2 RNA is generally detectable in upper and lower  respiratory specimens during the acute phase of infection. The lowest  concentration of SARS-CoV-2 viral copies this assay can detect is 250  copies / mL. A negative result does not preclude SARS-CoV-2 infection  and should not be used as the sole basis for treatment or other  patient management decisions.  A negative result may occur with  improper specimen collection / handling, submission of specimen other  than nasopharyngeal swab, presence of viral mutation(s) within the  areas targeted by this assay, and inadequate number of viral copies  (<250 copies / mL). A negative result must be combined with clinical  observations, patient history, and epidemiological information. If result is POSITIVE SARS-CoV-2 target nucleic acids are DETECTED. The SARS-CoV-2 RNA is generally detectable in upper and lower  respiratory specimens dur ing the acute phase of infection.  Positive  results are indicative of active infection with SARS-CoV-2.  Clinical  correlation with patient history and other diagnostic information is  necessary to determine patient infection status.  Positive results do  not rule out bacterial infection or co-infection with other viruses. If result is PRESUMPTIVE POSTIVE SARS-CoV-2 nucleic acids MAY BE PRESENT.   A presumptive positive result was obtained on the submitted specimen  and confirmed on repeat testing.  While 2019 novel coronavirus  (SARS-CoV-2) nucleic acids may be present in the submitted sample  additional confirmatory testing may be necessary for epidemiological  and / or clinical management purposes  to differentiate between  SARS-CoV-2  and other Sarbecovirus currently known to infect humans.  If clinically indicated additional testing with an alternate test  methodology [phone]) is advised. The SARS-CoV-2 RNA is generally  detectable in upper and lower respiratory sp ecimens during the acute  phase of infection. The expected result is Negative. Fact Sheet for Patients:  StrictlyIdeas.no Fact Sheet for Healthcare Providers: BankingDealers.co.za This test is not yet approved or cleared by the [place] FDA and has been authorized for detection and/or diagnosis of SARS-CoV-2 by FDA under an Emergency Use Authorization (EUA).  This EUA will remain in effect (meaning this test can be used) for the duration of the COVID-19 declaration under Section 564(b)(1) of the Act, 21 U.S.C. section 360bbb-3(b)(1), unless the authorization is terminated or revoked sooner. Performed at [facility], [address] [place], [place] [address]   Procalcitonin  Status: None   Collection Time: 07/30/19  8:42 PM  Result Value Ref Range   Procalcitonin <0.10 ng/mL    Comment:        Interpretation: PCT (Procalcitonin) <= 0.5 ng/mL: Systemic infection (sepsis) is not likely. Local bacterial infection is possible. (NOTE)       Sepsis PCT Algorithm           Lower Respiratory Tract                                      Infection PCT Algorithm    ----------------------------     ----------------------------         PCT < 0.25 ng/mL                PCT < 0.10 ng/mL         Strongly encourage             Strongly discourage   discontinuation of antibiotics    initiation of antibiotics    ----------------------------     -----------------------------       PCT 0.25 - 0.50 ng/mL            PCT 0.10 - 0.25 ng/mL               OR       >80% decrease in PCT            Discourage initiation of                                            antibiotics      Encourage discontinuation            of antibiotics    ----------------------------     -----------------------------         PCT >= 0.50 ng/mL              PCT 0.26 - 0.50 ng/mL               AND        <80% decrease in PCT             Encourage initiation of                                             antibiotics       Encourage continuation           of antibiotics    ----------------------------     -----------------------------        PCT >= 0.50 ng/mL                  PCT > 0.50 ng/mL               AND         increase in PCT                  Strongly encourage                                      initiation of antibiotics    Strongly  encourage escalation           of antibiotics                                     -----------------------------                                           PCT <= 0.25 ng/mL                                                 OR                                        > 80% decrease in PCT                                     Discontinue / Do not initiate                                             antibiotics Performed at [facility], [address] [place], [place] [address]   Glucose, capillary     Status: Abnormal   Collection Time: 07/30/19 10:05 PM  Result Value Ref Range   Glucose-Capillary 253 (H) 70 - 99 mg/dL  Glucose, capillary     Status: Abnormal   Collection Time: 07/31/19 12:39 AM  Result Value Ref Range   Glucose-Capillary 263 (H) 70 - 99 mg/dL   Comment 1 Document in Chart   Creatinine, serum     Status: None   Collection Time: 07/31/19  5:13 AM  Result Value Ref Range   Creatinine, Ser 0.68 0.44 - 1.00 mg/dL   GFR calc non Af Amer >60 >60 mL/min   GFR calc Af Amer >60 >60 mL/min    Comment: Performed at [facility], [address] [place], [place] [address]   Ct Head Wo Contrast  Result Date: 07/30/2019 CLINICAL DATA:  Fall EXAM: CT HEAD WITHOUT CONTRAST TECHNIQUE: Contiguous axial images were obtained from the base of the skull  through the vertex without intravenous contrast. COMPARISON:  10/22/2014 FINDINGS: Brain: There is no mass, hemorrhage or extra-axial collection. The size and configuration of the ventricles and extra-axial CSF spaces are normal. There is hypoattenuation of the white matter, most commonly indicating chronic small vessel disease. Vascular: No abnormal hyperdensity of the major intracranial arteries or dural venous sinuses. No intracranial atherosclerosis. Skull: The visualized skull base, calvarium and extracranial soft tissues are normal. Sinuses/Orbits: No fluid levels or advanced mucosal thickening of the visualized paranasal sinuses. No mastoid or middle ear effusion. The orbits are normal. IMPRESSION: Chronic small vessel ischemia without acute intracranial abnormality. Electronically Signed   By: Ulyses Jarred M.D.   On: 07/30/2019 19:57   US Venous Img Lower Unilateral Right  Result Date: 07/31/2019 CLINICAL DATA:  Diffuse right leg pain for several months EXAM: RIGHT LOWER  EXTREMITY VENOUS DOPPLER ULTRASOUND TECHNIQUE: Gray-scale sonography with graded compression, as well as color Doppler and duplex ultrasound were performed to evaluate the lower extremity deep venous systems from the level of the common femoral vein and including the common femoral, femoral, profunda femoral, popliteal and calf veins including the posterior tibial, peroneal and gastrocnemius veins when visible. The superficial great saphenous vein was also interrogated. Spectral Doppler was utilized to evaluate flow at rest and with distal augmentation maneuvers in the common femoral, femoral and popliteal veins. COMPARISON:  None. FINDINGS: Contralateral Common Femoral Vein: Respiratory phasicity is normal and symmetric with the symptomatic side. No evidence of thrombus. Normal compressibility. Common Femoral Vein: No evidence of thrombus. Saphenofemoral Junction: No evidence of thrombus. Profunda Femoral Vein: No evidence of thrombus.  Femoral Vein: No evidence of thrombus. Popliteal Vein: No evidence of thrombus. Calf Veins: No evidence of thrombus. IMPRESSION: No evidence of right lower extremity deep venous thrombosis. Electronically Signed   By: Monte Fantasia M.D.   On: 07/31/2019 04:19   Dg Chest Port 1 View  Result Date: 07/30/2019 CLINICAL DATA:  Patient fell last night.  Instability. EXAM: PORTABLE CHEST 1 VIEW COMPARISON:  Radiographs 10/22/2014.  CT 05/25/2017. FINDINGS: 1544 hours. Suboptimal inspiration. The heart size and mediastinal contours are normal. The lungs are clear. There is no pleural effusion or pneumothorax. No acute osseous findings are identified. Previous lower cervical fusion noted. IMPRESSION: No evidence of active cardiopulmonary process. Electronically Signed   By: Richardean Sale M.D.   On: 07/30/2019 16:09   Dg Knee Complete 4 Views Right  Result Date: 07/30/2019 CLINICAL DATA:  Right knee pain.  Status post fall. EXAM: RIGHT KNEE - COMPLETE 4+ VIEW COMPARISON:  X-ray of the right tibia and fibula October 22, 2014 FINDINGS: There is no acute fracture or dislocation. Degenerative joint changes with narrow femoral tibial joint space and osteophyte formation are noted. The soft tissues are normal. IMPRESSION: No acute fracture or dislocation noted. Degenerative joint changes of right knee. Electronically Signed   By: Abelardo Diesel M.D.   On: 07/30/2019 16:11    Pending Labs Unresulted Labs (From admission, onward)    Start     Ordered   07/30/19 1937  HIV antibody (Routine Testing)  Once,   STAT     07/30/19 1939   07/30/19 1520  Blood Culture (routine x 2)  BLOOD CULTURE X 2,   STAT     07/30/19 1520   07/30/19 1520  Urine culture  ONCE - STAT,   STAT     07/30/19 1520          Vitals/Pain Today's Vitals   07/31/19 0500 07/31/19 0530 07/31/19 0600 07/31/19 0630  BP: (!) 146/59 136/61 (!) 144/63 (!) 157/69  Pulse: 72 74 74 77  Resp: 16 15 18 14   Temp:      TempSrc:      SpO2: 96% 99%  99% 98%  Weight:      Height:      PainSc:        Isolation Precautions No active isolations  Medications Medications  butalbital-acetaminophen-caffeine (FIORICET) 50-325-40 MG per tablet 1 tablet (has no administration in time range)  ibuprofen (ADVIL) tablet 800 mg (has no administration in time range)  traMADol (ULTRAM) tablet 50 mg (50 mg Oral Given 07/30/19 2243)  bumetanide (BUMEX) tablet 1 mg (has no administration in time range)  irbesartan (AVAPRO) tablet 300 mg (has no administration in time range)  carvedilol (COREG) tablet 37.5-50  mg (has no administration in time range)  cloNIDine (CATAPRES - Dosed in mg/24 hr) patch 0.3 mg (0.3 mg Transdermal Patch Applied 07/31/19 0032)  doxazosin (CARDURA) tablet 4 mg (4 mg Oral Given 07/31/19 0017)  hydrALAZINE (APRESOLINE) tablet 25 mg (25 mg Oral Given 07/30/19 2244)  rosuvastatin (CRESTOR) tablet 10 mg (has no administration in time range)  ALPRAZolam (XANAX) tablet 0.5 mg (0.5 mg Oral Given 07/30/19 2224)  escitalopram (LEXAPRO) tablet 10 mg (has no administration in time range)  traZODone (DESYREL) tablet 150 mg (150 mg Oral Given 07/31/19 0031)  insulin detemir (LEVEMIR) injection 70 Units (70 Units Subcutaneous Given 07/31/19 0018)  levothyroxine (SYNTHROID) tablet 150 mcg (has no administration in time range)  lactulose (CHRONULAC) 10 GM/15ML solution 10 g (has no administration in time range)  linaclotide (LINZESS) capsule 290 mcg (has no administration in time range)  lubiprostone (AMITIZA) capsule 24 mcg (has no administration in time range)  metoCLOPramide (REGLAN) tablet 10 mg (10 mg Oral Given 07/31/19 0017)  ondansetron (ZOFRAN) tablet 4 mg (has no administration in time range)  pantoprazole (PROTONIX) EC tablet 40 mg (has no administration in time range)  sucralfate (CARAFATE) tablet 1 g (has no administration in time range)  gabapentin (NEURONTIN) tablet 600 mg (600 mg Oral Given 07/31/19 0017)  cyclobenzaprine (FLEXERIL) tablet 20  mg (20 mg Oral Given 07/30/19 2243)  pramipexole (MIRAPEX) tablet 1 mg (1 mg Oral Given 07/31/19 0031)  potassium chloride SA (K-DUR) CR tablet 10 mEq (has no administration in time range)  diclofenac sodium (VOLTAREN) 1 % transdermal gel 4 g (has no administration in time range)  enoxaparin (LOVENOX) injection 40 mg (40 mg Subcutaneous Given 07/31/19 0021)  insulin aspart (novoLOG) injection 0-9 Units (has no administration in time range)  insulin aspart (novoLOG) injection 0-5 Units (3 Units Subcutaneous Given 07/31/19 0044)  ceFEPIme (MAXIPIME) 2 g in sodium chloride 0.9 % 100 mL IVPB (0 g Intravenous Stopped 07/31/19 0420)  vancomycin (VANCOCIN) IVPB 1000 mg/200 mL premix (has no administration in time range)  budesonide (PULMICORT) nebulizer solution 0.5 mg (0.5 mg Nebulization Not Given 07/31/19 0023)  oxyCODONE-acetaminophen (PERCOCET/ROXICET) 5-325 MG per tablet 1 tablet (has no administration in time range)    And  oxyCODONE (Oxy IR/ROXICODONE) immediate release tablet 5 mg (5 mg Oral Given 07/31/19 0228)  sodium chloride 0.9 % bolus 1,000 mL (0 mLs Intravenous Stopped 07/30/19 1837)  ceFEPIme (MAXIPIME) 2 g in sodium chloride 0.9 % 100 mL IVPB (0 g Intravenous Stopped 07/30/19 2001)  metroNIDAZOLE (FLAGYL) IVPB 500 mg (0 mg Intravenous Stopped 07/31/19 0137)  vancomycin (VANCOCIN) IVPB 1000 mg/200 mL premix (0 mg Intravenous Stopped 07/30/19 2151)  vancomycin (VANCOCIN) 1,500 mg in sodium chloride 0.9 % 500 mL IVPB (0 mg Intravenous Stopped 07/31/19 0022)    Mobility walks Low fall risk   Focused Assessments .   R Recommendations: See Admitting Provider Note  Report given to:   Additional Notes:

## 2019-08-01 LAB — CBC
HCT: 34 % — ABNORMAL LOW (ref 36.0–46.0)
Hemoglobin: 10.5 g/dL — ABNORMAL LOW (ref 12.0–15.0)
MCH: 26.9 pg (ref 26.0–34.0)
MCHC: 30.9 g/dL (ref 30.0–36.0)
MCV: 87.2 fL (ref 80.0–100.0)
Platelets: 144 10*3/uL — ABNORMAL LOW (ref 150–400)
RBC: 3.9 MIL/uL (ref 3.87–5.11)
RDW: 13.9 % (ref 11.5–15.5)
WBC: 5.9 10*3/uL (ref 4.0–10.5)
nRBC: 0 % (ref 0.0–0.2)

## 2019-08-01 LAB — URINE CULTURE: Culture: <10000 — AB

## 2019-08-01 LAB — BASIC METABOLIC PANEL
Anion gap: 8 (ref 5–15)
BUN: 10 mg/dL (ref 8–23)
CO2: 29 mmol/L (ref 22–32)
Calcium: 9.6 mg/dL (ref 8.9–10.3)
Chloride: 101 mmol/L (ref 98–111)
Creatinine, Ser: 0.64 mg/dL (ref 0.44–1.00)
GFR calc Af Amer: >60 mL/min (ref >60)
GFR calc non Af Amer: >60 mL/min (ref >60)
Glucose, Bld: 198 mg/dL — ABNORMAL HIGH (ref 70–99)
Potassium: 3.9 mmol/L (ref 3.5–5.1)
Sodium: 138 mmol/L (ref 135–145)

## 2019-08-01 LAB — GLUCOSE, CAPILLARY
Glucose-Capillary: 169 mg/dL — ABNORMAL HIGH (ref 70–99)
Glucose-Capillary: 267 mg/dL — ABNORMAL HIGH (ref 70–99)

## 2019-08-01 LAB — HIV ANTIBODY (ROUTINE TESTING W REFLEX): HIV Screen 4th Generation wRfx: NONREACTIVE

## 2019-08-01 NOTE — Evaluation (Signed)
Occupational Therapy Evaluation Patient Details Name: Brittney [name] MRN: [id number] DOB: September 13, 1951 Today's Date: 08/01/2019    History of Present Illness Pt admitted for sepsis secondary to UTI. Pt reports she had a fall where her legs gave out and she was unable to get up, lying in the floor x 12 hours. Imaging negative for DVT or fx. History includes DM, HTN, L TKR, lymphedema, COPD, CKD, MI, and back stimulator. Uses 3L of O2 on BiPap nightly.   Clinical Impression   Pt seen for OT evaluation this date. Prior to hospital admission, pt was I with 4WW for fxl mobility in the home and required MIN A for bathing from PCA as well as with some IADLs such as laundry and cleaning.  Pt lives in [facility] with ramped entrance.  Currently pt demonstrates some decreased standing tolerance requiring CGA-SBA for ADL mobility and MIN A for LB dressing, but this appears to be close to pt baseline.  No acute OT needs detected at this time and pt does not appear to require f/u during this hospitalization as she is aware of modified techniques and adaptations and receives aid help at home.     Follow Up Recommendations  No OT follow up    Equipment Recommendations  (LH sponge for bathing)    Recommendations for Other Services       Precautions / Restrictions Precautions Precautions: Fall Restrictions Weight Bearing Restrictions: No      Mobility Bed Mobility Overal bed mobility: Needs Assistance Bed Mobility: Supine to Sit     Supine to sit: Min guard     General bed mobility comments: Pt seated in armchair when OT presents for evaluation.  Transfers Overall transfer level: Needs assistance Equipment used: Rolling walker (2 wheeled) Transfers: Sit to/from Stand Sit to Stand: Min assist         General transfer comment: cues for hand placement. Bed elevated for ease. Once standing, wide BOS with R LE in ER position. Heavy reliance of B UE on RW    Balance Overall balance assessment:  Needs assistance;History of Falls Sitting-balance support: No upper extremity supported;Feet supported Sitting balance-Leahy Scale: Good     Standing balance support: Bilateral upper extremity supported Standing balance-Leahy Scale: Fair Standing balance comment: SBA for static stand with FWW, CGA for more dynamic movement.                           ADL either performed or assessed with clinical judgement   ADL Overall ADL's : Needs assistance/impaired Eating/Feeding: Independent   Grooming: Wash/dry hands;Wash/dry face;Set up;Sitting   Upper Body Bathing: Set up;Sitting   Lower Body Bathing: Minimal assistance;With adaptive equipment;Sit to/from stand   Upper Body Dressing : Set up;Sitting   Lower Body Dressing: Min guard;With adaptive equipment;Sit to/from stand   Toilet Transfer: Supervision/safety;RW   Toileting- Water quality scientist and Hygiene: Min guard;Sit to/from stand   Tub/ Shower Transfer: Supervision/safety;Shower seat;Rolling walker   Functional mobility during ADLs: Supervision/safety;Rolling walker General ADL Comments: Pt demos some difficulty with weight shifting with fxl mobility with use of [place]ari FWW d/t b/l knee pain (states she needs R TKR and that L knee is sore from fall). Pt compensates bearing more weight through UEs on FWW as evidenced by some shaking. Requires MIN verbal cues to assume more erect posture in standing.     Vision Baseline Vision/History: Wears glasses Wears Glasses: Reading only Patient Visual Report: No change from baseline  Perception     Praxis      Pertinent Vitals/Pain Pain Assessment: Faces Faces Pain Scale: Hurts little more Pain Location: R knee with mobility Pain Descriptors / Indicators: Aching;Grimacing Pain Intervention(s): Limited activity within patient's tolerance;Monitored during session     Hand Dominance     Extremity/Trunk Assessment Upper Extremity Assessment Upper Extremity  Assessment: RUE deficits/detail;LUE deficits/detail RUE Deficits / Details: 3+/5 shoulder and elbow flex/ext, 4/5 grip-c/o some UE soreness d/t fall LUE Deficits / Details: 3+/5 shoulder and elbow flex/ext, 4/5 grip   Lower Extremity Assessment Lower Extremity Assessment: Defer to PT evaluation       Communication Communication Communication: No difficulties   Cognition Arousal/Alertness: Awake/alert Behavior During Therapy: WFL for tasks assessed/performed Overall Cognitive Status: Within Functional Limits for tasks assessed                                     General Comments       Exercises Exercises: Other exercises Other Exercises Other Exercises: OT facilitates education re: fall prevention strategies including plans for adjusting home layout to improve open pathways and reduce hazards. Pt verbalized understanding. Other Exercises: OT facilitates education re: use of AE for LB ADLs, but pt familiar with most items already d/t past sx history and is able to provide 100% accurate teachback with all items.   Shoulder Instructions      Home Living Family/patient expects to be discharged to:: Private residence Living Arrangements: Alone Available Help at Discharge: Family;Personal care attendant Type of Home: House Home Access: [place]: [place]: Environmental consultant - 2 wheels;Walker - 4 wheels;Bedside commode;Shower seat;Shower seat - built in;Wheelchair - manual;Grab bars - toilet;Other (comment)(Pt had reacher, sock aid, leg lifter, and LH shoe horn)          Prior Functioning/Environment Level of Independence: Independent with assistive device(s)        Comments: able to ambulate household distances using rollator. Reports two recent falls, once in shower in May and once recently when she had to crawl to bedroom and was discovered by meals on wheels. Pt PCA helps with bathing, cleaning and laundry  although pt can do light loads. Pt also with two dogs in the home. Can perform light meal prep, meals on wheels assists.        OT Problem List: Decreased strength;Decreased range of motion;Decreased activity tolerance;Impaired balance (sitting and/or standing);Pain      OT Treatment/Interventions:      OT Goals(Current goals can be found in the care plan section) Acute Rehab OT Goals Patient Stated Goal: to get her legs stronger OT Goal Formulation: All assessment and education complete, DC therapy  OT Frequency:     Barriers to D/C:            Co-evaluation              AM-PAC OT "6 Clicks" Daily Activity     Outcome Measure Help from another person eating meals?: None Help from another person taking care of personal grooming?: None Help from another person toileting, which includes using toliet, bedpan, or urinal?: A Little Help from another person bathing (including washing, rinsing, drying)?: A Little Help from another person to put on and taking off regular upper body clothing?: None Help from another person to  put on and taking off regular lower body clothing?: A Little 6 Click Score: 21   End of Session Equipment Utilized During Treatment: Gait belt;Rolling walker  Activity Tolerance: Patient tolerated treatment well;Patient limited by pain Patient left: in chair;with chair alarm set;with nursing/sitter in room(CNA presented to take pt VS)  OT Visit Diagnosis: Unsteadiness on feet (R26.81);Muscle weakness (generalized) (M62.81)                Time: 5369-2230 OT Time Calculation (min): 49 min Charges:  OT Evaluation $OT Eval Moderate Complexity: 1 Mod OT Treatments $Self Care/Home Management : 23-37 mins $Therapeutic Activity: 8-22 mins  Gerrianne Scale, MS, OTR/L ascom [phone] or [phone] 08/01/19, 3:01 PM

## 2019-08-01 NOTE — Evaluation (Signed)
Physical Therapy Evaluation Patient Details Name: Brittney [name] MRN: [id number] DOB: [birth year] Today's Date: 08/01/2019   History of Present Illness  Pt admitted for sepsis secondary to UTI. Pt reports she had a fall where her legs gave out and she was unable to get up, lying in the floor x 12 hours. Imaging negative for DVT or fx. History includes DM, HTN, L TKR, lymphedema, COPD, CKD, MI, and back stimulator. Uses 3L of O2 nightly.  Clinical Impression  Pt is a pleasant 68 year old female who was admitted for sepsis secondary to UTI. Pt performs bed mobility with cga, transfers with min assist, and ambulation with cga and RW. Limited ambulation secondary to pain in R LE from fall. Pt demonstrates deficits with strength/mobility/endurance/pain. Pt is close to baseline and feels she can manage at home with limited ambulation to [facility] and increased family support. Educated on falls precautions and safety. She reports she is getting a life alert. Would benefit from skilled PT to address above deficits and promote optimal return to PLOF. Recommend transition to [place] upon discharge from acute hospitalization.     Follow Up Recommendations Home health PT;Supervision/Assistance - 24 hour    Equipment Recommendations  None recommended by PT    Recommendations for Other Services       Precautions / Restrictions Precautions Precautions: Fall Restrictions Weight Bearing Restrictions: No      Mobility  Bed Mobility Overal bed mobility: Needs Assistance Bed Mobility: Supine to Sit     Supine to sit: Min guard     General bed mobility comments: needs rails for assistance and cues for technique. Guided R LE off bed. Once seated, slightly dizzy, however improved quickly  Transfers Overall transfer level: Needs assistance Equipment used: Rolling walker (2 wheeled) Transfers: Sit to/from Stand Sit to Stand: Min assist         General transfer comment: cues for hand placement. Bed  elevated for ease. Once standing, wide BOS with R LE in ER position. Heavy reliance of B UE on RW  Ambulation/Gait Ambulation/Gait assistance: Min guard Gait Distance (Feet): 10 Feet Assistive device: Rolling walker (2 wheeled) Gait Pattern/deviations: Step-to pattern     General Gait Details: cautious ambulation including side stepping up/down bed and then ambulation to recliner. Pt fatigues quickly and request to sit. Step to gait pattern  Stairs            Wheelchair Mobility    Modified Rankin (Stroke Patients Only)       Balance Overall balance assessment: Needs assistance;History of Falls Sitting-balance support: No upper extremity supported;Feet supported Sitting balance-Leahy Scale: Good     Standing balance support: Bilateral upper extremity supported Standing balance-Leahy Scale: Fair                               Pertinent Vitals/Pain Pain Assessment: Faces Faces Pain Scale: Hurts a little bit Pain Location: "all over" Pain Descriptors / Indicators: Sore Pain Intervention(s): Limited activity within patient's tolerance;Repositioned    Home Living Family/patient expects to be discharged to:: Private residence Living Arrangements: Alone Available Help at Discharge: Family;Personal care attendant(caregivers come every day for a few hours) Type of Home: House Home Access: Ramped entrance     Home Layout: One level Home Equipment: Walker - 2 wheels;Walker - 4 wheels;Bedside commode;Shower seat;Shower seat - built in;Wheelchair - manual      Prior Function Level of Independence: Independent with assistive device(s)  Comments: able to ambulate household distances using rollator. Reports previous falls.     Hand Dominance        Extremity/Trunk Assessment   Upper Extremity Assessment Upper Extremity Assessment: Overall WFL for tasks assessed    Lower Extremity Assessment Lower Extremity Assessment: Generalized weakness(R  LE grossly 3/5; L LE grossly 4/5)       Communication   Communication: No difficulties  Cognition Arousal/Alertness: Awake/alert Behavior During Therapy: WFL for tasks assessed/performed Overall Cognitive Status: Within Functional Limits for tasks assessed                                        General Comments      Exercises Other Exercises Other Exercises: Seated ther-ex performed including B AP, LAQ, alt marching and standing marching x 10 reps on B LE Other Exercises: multiple standing attempt from various surfaces and heights x 3 reps using RW   Assessment/Plan    PT Assessment Patient needs continued PT services  PT Problem List Decreased strength;Decreased activity tolerance;Decreased balance;Decreased mobility;Obesity;Pain       PT Treatment Interventions Gait training;Therapeutic exercise;Balance training    PT Goals (Current goals can be found in the Care Plan section)  Acute Rehab PT Goals Patient Stated Goal: to get her legs stronger PT Goal Formulation: With patient Time For Goal Achievement: 08/15/19 Potential to Achieve Goals: Good    Frequency Min 2X/week   Barriers to discharge Decreased caregiver support      Co-evaluation               AM-PAC PT "6 Clicks" Mobility  Outcome Measure Help needed turning from your back to your side while in a flat bed without using bedrails?: A Little Help needed moving from lying on your back to sitting on the side of a flat bed without using bedrails?: A Little Help needed moving to and from a bed to a chair (including a wheelchair)?: A Little Help needed standing up from a chair using your arms (e.g., wheelchair or bedside chair)?: A Little Help needed to walk in hospital room?: A Lot Help needed climbing 3-5 steps with a railing? : Total 6 Click Score: 15    End of Session Equipment Utilized During Treatment: Gait belt Activity Tolerance: Patient tolerated treatment well Patient left:  in chair;with chair alarm set Nurse Communication: Mobility status PT Visit Diagnosis: Unsteadiness on feet (R26.81);Muscle weakness (generalized) (M62.81);History of falling (Z91.81);Difficulty in walking, not elsewhere classified (R26.2);Pain Pain - Right/Left: Right Pain - part of body: Leg    Time: 5465-0354 PT Time Calculation (min) (ACUTE ONLY): 41 min   Charges:   PT Evaluation $PT Eval Moderate Complexity: 1 Mod PT Treatments $Therapeutic Exercise: 23-37 mins        Greggory Stallion, PT, DPT [phone]   Brittney [name] 08/01/2019, 11:44 AM

## 2019-08-01 NOTE — TOC Transition Note (Signed)
Transition of Care [facility]) - CM/SW Discharge Note   Patient Details  Name: Brittney [name] MRN: [id number] Date of Birth: [birth year]  Transition of [facility]) CM/SW Contact:  Shelbie Hutching, RN Phone Number: 08/01/2019, 2:16 PM   Clinical Narrative:     Patient to be discharged this afternoon with home health services, PT and OT.  Medicare approved [facility] agency list given to patient for choice.  Patient chooses [facility].  Daughter will provide transportation at discharge.    Final next level of care: [place] Barriers to Discharge: Barriers Resolved   Patient Goals and CMS Choice Patient states their goals for this hospitalization and ongoing recovery are:: does not want to take anymore antibiotics and wants to go home with a good attitude and stronger legs. CMS Medicare.gov Compare Post Acute Care list provided to:: Patient Choice offered to / list presented to : Patient  Discharge Placement                       Discharge Plan and Services   Discharge Planning Services: CM Consult                      HH Arranged: PT, OT [facility] Agency: [place] ([place]) Date [place]: 08/01/19 Time [place]: [place] Representative spoke with at [place]: [place] (SDOH) Interventions     Readmission Risk Interventions No flowsheet data found.

## 2019-08-01 NOTE — Progress Notes (Signed)
Inpatient Diabetes Program Recommendations  AACE/ADA: New Consensus Statement on Inpatient Glycemic Control   Target Ranges:  Prepandial:   less than 140 mg/dL      Peak postprandial:   less than 180 mg/dL (1-2 hours)      Critically ill patients:  140 - 180 mg/dL   Results for Brittney [name], Brittney "PAM" (MRN [id number]) as of 08/01/2019 10:19  Ref. Range 07/31/2019 00:39 07/31/2019 09:31 07/31/2019 11:58 07/31/2019 16:51 07/31/2019 21:01 08/01/2019 07:28  Glucose-Capillary Latest Ref Range: 70 - 99 mg/dL 263 (H) 239 (H) 300 (H) 227 (H) 210 (H) 169 (H)   Review of Glycemic Control  Diabetes history: DM2 Outpatient Diabetes medications: Levemir 140-170 units 1 to 2 times a day, Humulin R U500 40-80 units 2 to 3 times per day Current orders for Inpatient glycemic control: Levemir 70 units BID, Novolog 0-9 units TID with meals, Novolog 0-5 units QHS  Inpatient Diabetes Program Recommendations:   Insulin-Meal Coverage: Please consider ordering Novolog 5 units TID with meals for meal coverage if patient eats at least 50% of meals.  Thanks, Barnie Alderman, RN, MSN, CDE Diabetes Coordinator Inpatient Diabetes Program [phone] (Team Pager from 8am to 5pm)

## 2019-08-01 NOTE — Discharge Instructions (Signed)

## 2019-08-02 NOTE — Discharge Summary (Signed)
[facility] [place] NAME: Brittney [name]    MR#:  [id number]  DATE OF BIRTH:  Nov 10, 1951  DATE OF ADMISSION:  07/30/2019   ADMITTING PHYSICIAN: Lang Snow, NP  DATE OF DISCHARGE: 08/01/2019  5:40 PM  PRIMARY CARE PHYSICIAN: Rusty Aus, MD   ADMISSION DIAGNOSIS:  Weakness [R53.1] Fall, initial encounter [W19.XXXA] Lower extremity pain, diffuse, right [M79.604] DISCHARGE DIAGNOSIS:  Active Problems:   Sepsis ([place])  SECONDARY DIAGNOSIS:   Past Medical History:  Diagnosis Date  . Abdominal pain, left upper quadrant 12/11/2012  . Arthritis   . Asthma   . Broken leg 2014   fell twice and broke same leg. had a bone stimulator for first break.  . Cancer ([place]) 2007   melanoma, left ear and back of left leg, removed from back  . Chronic kidney disease, stage 2 (mild)   . Chronic lower back pain   . Collagen vascular disease ([place])   . Coronary artery disease   . Diabetes mellitus without complication ([place])   . Diabetic nephropathy associated with secondary diabetes mellitus ([place])   . [place] 12/11/2012  . Gastroparesis   . Gross hematuria 12/11/2012  . Hepatic cirrhosis ([place])   . Hyperlipidemia   . Hypertension   . Hypothyroidism   . Hypothyroidism   . IBS (irritable bowel syndrome)   . Kidney stone 12/11/2012  . Lower extremity edema   . Morbid obesity with BMI of 40.0-44.9, adult ([place])   . Myocardial infarction ([place]) 09/2013  . Nausea without vomiting 12/11/2012  . Nephrolithiasis   . Nephrolithiasis 04/26/2014  . Nonproliferative retinopathy due to secondary diabetes ([place])   . Numbness and tingling of right leg   . Peripheral vascular disease ([place])   . Renal colic 53/66/4403  . Renal insufficiency   . Sciatica   . Sciatica 12/11/2012  . Skin cancer 08/2018  . Sleep apnea    sleep study coming in november  . Unilateral small kidney without contralateral hypertrophy    HOSPITAL COURSE:  68  y.o.femalewith pertinent past medical history oftype 2 diabetes mellitus, hypothyroidism, hypertension, status post left total knee replacement, bilateral lymphedema, COPD, OSA on CPAP and 2 L O2 at bedtime, CKD stage II CAD, MI, and chronic lower back pain presenting to the ED with chief complaint of fall.  1. Fall -multifactorial, patient does have chronic lower back issue along with joint problems and chronic arthritis, also secondary to bilateral lower extremity weakness. Patient with hx of bilaterallymphedema of lower legs, bilateral knee pain status post left knee replacement pending replacement of right knee -CT head negative for acute intracranial abnormality -X-ray of right knee with no acute fractures - [place] of right Lower extremity - no DVT  2.Fever of unknown etiology -Transient, could be viral in nature  3.Coronary Artery Disease  - ASA 81mg  PO daily  4.HLD  + Goal LDL<100 -Rosuvastatin10mg  PO qhs  5.HTN + Goal BP <130/80 -Continue clonidine Coreg Avapro and hydralazine  6.DM: sugars have been well-controlled on current regimen -consider outpatient visit with Dr. Gabriel Carina to decide on insulin pump  7.Diabetic gastroparesis -Continue Linzess, Reglan Amitiza  8.Hypothyroidism-continue Synthroid  9.Chronic low back pain -Patient transdermal patch, Flexeril and oxycodone for pain  10. OSA - On CPAP and oxygen at home  11.  Lower extremity edema: -Seems more of chronic lymphedema -SCDs and teds might help at home. could also follow-up with lymphedema  clinic at [facility]:  Stable CONSULTS OBTAINED:   DRUG ALLERGIES:   Allergies  Allergen Reactions  . Eggs Or Egg-Derived Products Nausea And Vomiting    Only has reaction to flu vaccine due to eggs, no reaction to egg food products  . Shellfish Allergy Nausea And Vomiting, Diarrhea and Nausea Only    Non stop sickness once ingests shellfish. Betadine is okay  .  Amlodipine Cough  . Codeine Nausea And Vomiting and Nausea Only    states she can take codeine cough syrup without problems states she can take codeine cough syrup without problems  . Imdur [Isosorbide Dinitrate] Other (See Comments)    Headache  . Lyrica [Pregabalin] Swelling and Other (See Comments)    "FLU-LIKE" SYMPTOMS  . Monosodium Glutamate Other (See Comments)    Headache  . Tape Other (See Comments) and Nausea And Vomiting    can use paper tape Redness   DISCHARGE MEDICATIONS:   Allergies as of 08/01/2019      Reactions   Eggs Or Egg-derived Products Nausea And Vomiting   Only has reaction to flu vaccine due to eggs, no reaction to egg food products   Shellfish Allergy Nausea And Vomiting, Diarrhea, Nausea Only   Non stop sickness once ingests shellfish. Betadine is okay   Amlodipine Cough   Codeine Nausea And Vomiting, Nausea Only   states she can take codeine cough syrup without problems states she can take codeine cough syrup without problems   Imdur [isosorbide Dinitrate] Other (See Comments)   Headache   Lyrica [pregabalin] Swelling, Other (See Comments)   "FLU-LIKE" SYMPTOMS   Monosodium Glutamate Other (See Comments)   Headache   Tape Other (See Comments), Nausea And Vomiting   can use paper tape Redness      Medication List    STOP taking these medications   fluticasone 220 MCG/ACT inhaler Commonly known as: Flovent HFA     TAKE these medications   bumetanide 1 MG tablet Commonly known as: BUMEX Take 1.5 mg by mouth daily.   butalbital-acetaminophen-caffeine 50-325-40 MG tablet Commonly known as: FIORICET Take 1 tablet by mouth 2 (two) times daily as needed for migraine.   candesartan 32 MG tablet Commonly known as: ATACAND Take 1 tablet (32 mg total) by mouth daily.   carvedilol 25 MG tablet Commonly known as: COREG Take 50 mg by mouth 2 (two) times daily with a meal.   cloNIDine 0.3 mg/24hr patch Commonly known as: CATAPRES - Dosed in  mg/24 hr Place 0.3 mg onto the skin once a week.   cyclobenzaprine 10 MG tablet Commonly known as: FLEXERIL Take 20 mg by mouth at bedtime.   diclofenac sodium 1 % Gel Commonly known as: VOLTAREN Apply 2 g topically daily as needed (pain).   doxazosin 4 MG tablet Commonly known as: CARDURA Take 4 mg by mouth every evening.   escitalopram 10 MG tablet Commonly known as: LEXAPRO Take 10 mg by mouth daily.   fluticasone 50 MCG/ACT nasal spray Commonly known as: FLONASE Place 2 sprays into both nostrils 2 (two) times daily.   gabapentin 600 MG tablet Commonly known as: NEURONTIN Take 600-1,800 mg by mouth See admin instructions. Take 1 tablet (600mg ) by mouth twice daily and take 3 tablets (1800mg ) by mouth at bedtime   hydrALAZINE 50 MG tablet Commonly known as: APRESOLINE Take 50 mg by mouth 3 (three) times daily.   ibuprofen 800 MG tablet Commonly known as: ADVIL Take 800 mg by mouth  3 (three) times daily as needed for mild pain or moderate pain.   insulin regular human CONCENTRATED 500 UNIT/ML kwikpen Commonly known as: HUMULIN R Inject 40-80 Units into the skin See admin instructions. Inject 80u under the skin at breakfast, inject 65-70u under the skin at lunch and inject 40-80u under the skin at dinner time based on blood glucose readings   Levemir FlexTouch 100 UNIT/ML Pen Generic drug: Insulin Detemir Inject 140 Units into the skin 2 (two) times daily.   levothyroxine 150 MCG tablet Commonly known as: SYNTHROID Take 150 mcg by mouth daily before breakfast.   Linzess 290 MCG Caps capsule Generic drug: linaclotide Take 290 mcg by mouth daily.   metoCLOPramide 10 MG tablet Commonly known as: REGLAN Take 10 mg by mouth 3 (three) times daily.   ondansetron 4 MG tablet Commonly known as: ZOFRAN Take 4 mg by mouth every 12 (twelve) hours as needed for nausea or vomiting.   oxyCODONE-acetaminophen 10-325 MG tablet Commonly known as: Percocet Take 1 tablet by  mouth 2 (two) times daily as needed for pain. Must last 30 days.   oxyCODONE-acetaminophen 10-325 MG tablet Commonly known as: Percocet Take 1 tablet by mouth 2 (two) times daily as needed for pain. Must last 30 days. Start taking on: August 09, 2019   pantoprazole 40 MG tablet Commonly known as: PROTONIX Take 40 mg by mouth 2 (two) times daily.   potassium chloride 10 MEQ tablet Commonly known as: K-DUR Take 10 mEq by mouth daily.   pramipexole 0.5 MG tablet Commonly known as: MIRAPEX Take 0.5 mg by mouth 2 (two) times daily.   traMADol 50 MG tablet Commonly known as: ULTRAM Take 100 mg by mouth 3 (three) times daily.   traZODone 150 MG tablet Commonly known as: DESYREL Take 150 mg by mouth at bedtime.   Ventolin HFA 108 (90 Base) MCG/ACT inhaler Generic drug: albuterol Inhale 2 puffs into the lungs 3 (three) times daily as needed for wheezing or shortness of breath.   Xanax 0.5 MG tablet Generic drug: ALPRAZolam Take 0.5 mg by mouth 4 (four) times daily.   zolpidem 10 MG tablet Commonly known as: AMBIEN Take 10 mg by mouth at bedtime.        DISCHARGE INSTRUCTIONS:   DIET:  Cardiac diet DISCHARGE CONDITION:  Stable ACTIVITY:  Activity as tolerated OXYGEN:  Home Oxygen: No.  Oxygen Delivery: room air DISCHARGE LOCATION:  home   If you experience worsening of your admission symptoms, develop shortness of breath, life threatening emergency, suicidal or homicidal thoughts you must seek medical attention immediately by calling 911 or calling your MD immediately  if symptoms less severe.  You Must read complete instructions/literature along with all the possible adverse reactions/side effects for all the Medicines you take and that have been prescribed to you. Take any new Medicines after you have completely understood and accpet all the possible adverse reactions/side effects.   Please note  You were cared for by a hospitalist during your hospital stay. If  you have any questions about your discharge medications or the care you received while you were in the hospital after you are discharged, you can call the unit and asked to speak with the hospitalist on call if the hospitalist that took care of you is not available. Once you are discharged, your primary care physician will handle any further medical issues. Please note that NO REFILLS for any discharge medications will be authorized once you are discharged, as it is  imperative that you return to your primary care physician (or establish a relationship with a primary care physician if you do not have one) for your aftercare needs so that they can reassess your need for medications and monitor your lab values.    On the day of Discharge:  VITAL SIGNS:  Blood pressure (!) 196/86, pulse 66, temperature 99.8 F (37.7 C), resp. rate 18, height 5\' 6"  (1.676 m), weight (!) 159.7 kg, SpO2 94 %. PHYSICAL EXAMINATION:  GENERAL:  68 y.o.-year-old patient lying in the bed with no acute distress.  EYES: Pupils equal, round, reactive to light and accommodation. No scleral icterus. Extraocular muscles intact.  HEENT: Head atraumatic, normocephalic. Oropharynx and nasopharynx clear.  NECK:  Supple, no jugular venous distention. No thyroid enlargement, no tenderness.  LUNGS: Normal breath sounds bilaterally, no wheezing, rales,rhonchi or crepitation. No use of accessory muscles of respiration.  CARDIOVASCULAR: S1, S2 normal. No murmurs, rubs, or gallops.  ABDOMEN: Soft, non-tender, non-distended. Bowel sounds present. No organomegaly or mass.  EXTREMITIES: No pedal edema, cyanosis, or clubbing.  NEUROLOGIC: Cranial nerves II through XII are intact. Muscle strength 5/5 in all extremities. Sensation intact. Gait not checked.  PSYCHIATRIC: The patient is alert and oriented x 3.  SKIN: No obvious rash, lesion, or ulcer.  DATA REVIEW:   CBC Recent Labs  Lab 08/01/19 0558  WBC 5.9  HGB 10.5*  HCT 34.0*  PLT  144*    Chemistries  Recent Labs  Lab 07/30/19 1634  08/01/19 0558  NA 134*  --  138  K 4.3  --  3.9  CL 95*  --  101  CO2 31  --  29  GLUCOSE 257*  --  198*  BUN 10  --  10  CREATININE 0.76   < > 0.64  CALCIUM 9.8  --  9.6  AST 39  --   --   ALT 34  --   --   ALKPHOS 125  --   --   BILITOT 0.5  --   --    < > = values in this interval not displayed.     Follow-up Information    Rusty Aus, MD. Go on 08/10/2019.   Specialty: Internal Medicine Why: @ 3:30pm Contact information: [address] [place] [address] [phone]            Management plans discussed with the patient, nursing and they are in agreement.  CODE STATUS: Prior   TOTAL TIME TAKING CARE OF THIS PATIENT: 45 minutes.    Max Sane M.D on 08/02/2019 at 3:25 PM  Between 7am to 6pm - Pager - [phone]  After 6pm go to [email or web address] - Proofreader  [facility]  Office  [phone]  CC: Primary care physician; Rusty Aus, MD   Note: This dictation was prepared with Dragon dictation along with smaller phrase technology. Any transcriptional errors that result from this process are unintentional.

## 2019-08-04 LAB — CULTURE, BLOOD (ROUTINE X 2)
Culture: NO GROWTH
Culture: NO GROWTH
Special Requests: ADEQUATE

## 2019-08-14 ENCOUNTER — Encounter: Payer: Self-pay | Admitting: Student in an Organized Health Care Education/Training Program

## 2019-08-15 ENCOUNTER — Ambulatory Visit
Payer: Medicare Other | Attending: Student in an Organized Health Care Education/Training Program | Admitting: Student in an Organized Health Care Education/Training Program

## 2019-08-15 ENCOUNTER — Encounter: Payer: Self-pay | Admitting: Student in an Organized Health Care Education/Training Program

## 2019-08-15 ENCOUNTER — Other Ambulatory Visit: Payer: Self-pay

## 2019-08-15 DIAGNOSIS — M1711 Unilateral primary osteoarthritis, right knee: Secondary | ICD-10-CM

## 2019-08-15 NOTE — Progress Notes (Signed)
Pain Management Virtual Encounter Note - Virtual Visit via [place] (real-time audio visits between healthcare provider and patient).   Patient's Phone No. & Preferred Pharmacy:  [phone] (home); [phone] (mobile); (Preferred) [phone] [email or web address]  [id number] - [place], [place] - [address] [place] [address] Phone: [phone] Fax: [phone]    Pre-screening note:  Our staff contacted Brittney [name] and offered her an "in person", "face-to-face" appointment versus a telephone encounter. She indicated preferring the telephone encounter, at this time.   Reason for Virtual Visit: COVID-19*  Social distancing based on CDC and AMA recommendations.   I contacted Brittney [name] on 08/15/2019 via video conference.      I clearly identified myself as [name], MD. I verified that I was speaking with the correct person using two identifiers (Name: Brittney [name], and date of birth: 1951/05/11).  Advanced Informed Consent I sought verbal advanced consent from Brittney [name] for virtual visit interactions. I informed Brittney [name] of possible security and privacy concerns, risks, and limitations associated with providing "not-in-person" medical evaluation and management services. I also informed Brittney [name] of the availability of "in-person" appointments. Finally, I informed her that there would be a charge for the virtual visit and that she could be  personally, fully or partially, financially responsible for it. Brittney [name] expressed understanding and agreed to proceed.   Historic Elements   Brittney [name] is a 68 y.o. year old, female patient evaluated today after her last encounter by our practice on 07/19/2019. Brittney [name]  has a past medical history of Abdominal pain, left upper quadrant (12/11/2012), Arthritis, Asthma, Broken leg (2014), Cancer ([place]) (2007), Chronic kidney disease, stage 2 (mild), Chronic lower back pain,  Collagen vascular disease ([place]), Coronary artery disease, Diabetes mellitus without complication ([place]), Diabetic nephropathy associated with secondary diabetes mellitus ([place]), Flushing (12/11/2012), Gastroparesis, Gross hematuria (12/11/2012), Hepatic cirrhosis ([place]), Hyperlipidemia, Hypertension, Hypothyroidism, Hypothyroidism, IBS (irritable bowel syndrome), Kidney stone (12/11/2012), Lower extremity edema, Morbid obesity with BMI of 40.0-44.9, adult ([place]), Myocardial infarction ([place]) (09/2013), Nausea without vomiting (12/11/2012), Nephrolithiasis, Nephrolithiasis (04/26/2014), Nonproliferative retinopathy due to secondary diabetes ([place]), Numbness and tingling of right leg, Peripheral vascular disease ([place]), Renal colic (16/06/3709), Renal insufficiency, Sciatica, Sciatica (12/11/2012), Skin cancer (08/2018), Sleep apnea, and Unilateral small kidney without contralateral hypertrophy. She also  has a past surgical history that includes Cesarean section (1980); Dilation and curettage of uterus; Eye surgery (1986); Abdominal hysterectomy; Tonsillectomy; Esophagogastroduodenoscopy (02/08/2014); Colonoscopy (05/04/2001); Esophagogastroduodenoscopy (egd) with propofol (N/A, 09/20/2016); Colonoscopy with propofol (N/A, 09/20/2016); Mohs surgery (11/2016); Cholecystectomy; Hernia repair; Joint replacement (Left, 09/24/2013); Back surgery (04/1999); Pulse generator implant (N/A, 11/02/2017); and Esophagogastroduodenoscopy (egd) with propofol (N/A, 05/23/2018). Brittney [name] has a current medication list which includes the following prescription(s): albuterol, alprazolam, butalbital-acetaminophen-caffeine, candesartan, carvedilol, clonidine, cyclobenzaprine, diclofenac sodium, doxazosin, escitalopram, fluticasone, fluticasone, gabapentin, hydralazine, ibuprofen, insulin detemir, insulin regular human concentrated, levothyroxine, linzess, metoclopramide, ondansetron, oxycodone-acetaminophen, pantoprazole, potassium chloride,  pramipexole, tramadol, trazodone, zolpidem, and bumetanide. She  reports that she has never smoked. She has never used smokeless tobacco. She reports that she does not drink alcohol or use drugs. Brittney [name] is allergic to eggs or egg-derived products; shellfish allergy; amlodipine; codeine; imdur [isosorbide dinitrate]; lyrica [pregabalin]; monosodium glutamate; and tape.   HPI  Today, she is being contacted for a post-procedure assessment.  Evaluation of last interventional procedure  07/18/2019 Procedure: RIGHT knee GNB Pre-procedure pain score:  7/10 Post-procedure pain score: 3/10  Influential Factors: Intra-procedural challenges: None observed.         Reported side-effects: None.        Post-procedural adverse reactions or complications: None reported         Sedation: Please see nurses note for DOS. When no sedatives are used, the analgesic levels obtained are directly associated to the effectiveness of the local anesthetics. However, when sedation is provided, the level of analgesia obtained during the initial 1 hour following the intervention, is believed to be the result of a combination of factors. These factors may include, but are not limited to: 1. The effectiveness of the local anesthetics used. 2. The effects of the analgesic(s) and/or anxiolytic(s) used. 3. The degree of discomfort experienced by the patient at the time of the procedure. 4. The patients ability and reliability in recalling and recording the events. 5. The presence and influence of possible secondary gains and/or psychosocial factors. Reported result: Relief experienced during the 1st hour after the procedure: 100 % (Ultra-Short Term Relief)            Interpretative annotation: Clinically appropriate result. Analgesia during this period is likely to be Local Anesthetic and/or IV Sedative (Analgesic/Anxiolytic) related.          Effects of local anesthetic: The analgesic effects attained during this  period are directly associated to the localized infiltration of local anesthetics and therefore cary significant diagnostic value as to the etiological location, or anatomical origin, of the pain. Expected duration of relief is directly dependent on the pharmacodynamics of the local anesthetic used. Long-acting (4-6 hours) anesthetics used.  Reported result: Relief during the next 4 to 6 hour after the procedure: 90 % (Short-Term Relief)            Interpretative annotation: Clinically appropriate result. Analgesia during this period is likely to be Local Anesthetic-related.          Long-term benefit: Defined as the period of time past the expected duration of local anesthetics (1 hour for short-acting and 4-6 hours for long-acting). With the possible exception of prolonged sympathetic blockade from the local anesthetics, benefits during this period are typically attributed to, or associated with, other factors such as analgesic sensory neuropraxia, antiinflammatory effects, or beneficial biochemical changes provided by agents other than the local anesthetics.  Reported result: Extended relief following procedure: 90 %(lasted 3 to 4 days, pt fell  07/29/19, injuried both knee) (Long-Term Relief)            Interpretative annotation: Clinically appropriate result. Good relief. No permanent benefit expected. Inflammation plays a part in the etiology to the pain.           UDS:  Summary  Date Value Ref Range Status  01/03/2019 FINAL  Final    Comment:    ==================================================================== TOXASSURE SELECT 13 (MW) ==================================================================== Test                             Result       Flag       Units Drug Present and Declared for Prescription Verification   Alprazolam                     165          EXPECTED   ng/mg creat   Alpha-hydroxyalprazolam        412          EXPECTED   ng/mg creat  Source of alprazolam is a  scheduled prescription medication.    Alpha-hydroxyalprazolam is an expected metabolite of alprazolam.   Oxycodone                      812          EXPECTED   ng/mg creat   Oxymorphone                    932          EXPECTED   ng/mg creat   Noroxycodone                   1294         EXPECTED   ng/mg creat   Noroxymorphone                 332          EXPECTED   ng/mg creat    Sources of oxycodone are scheduled prescription medications.    Oxymorphone, noroxycodone, and noroxymorphone are expected    metabolites of oxycodone. Oxymorphone is also available as a    scheduled prescription medication.   Tramadol                       2565         EXPECTED   ng/mg creat   O-Desmethyltramadol            7935         EXPECTED   ng/mg creat   N-Desmethyltramadol            2141         EXPECTED   ng/mg creat    Source of tramadol is a prescription medication.    O-desmethyltramadol and N-desmethyltramadol are expected    metabolites of tramadol.   Butalbital                     PRESENT      EXPECTED ==================================================================== Test                      Result    Flag   Units      Ref Range   Creatinine              34               mg/dL      >=20 ==================================================================== Declared Medications:  The flagging and interpretation on this report are based on the  following declared medications.  Unexpected results may arise from  inaccuracies in the declared medications.  **Note: The testing scope of this panel includes these medications:  Alprazolam  Butalbital (Butalbital/APAP/Caffeine)  Oxycodone (Percocet)  Tramadol (Ultram)  **Note: The testing scope of this panel does not include following  reported medications:  Acetaminophen (Butalbital/APAP/Caffeine)  Acetaminophen (Percocet)  Albuterol  Bumetanide  Caffeine (Butalbital/APAP/Caffeine)  Candesartan  Carvedilol  Clonidine  Cyclobenzaprine  (Flexeril)  Doxazosin (Cardura)  Escitalopram (Lexapro)  Fluticasone (Flonase)  Fluticasone (Flovent)  Gabapentin (Neurontin)  Hydralazine (Apresoline)  Ibuprofen (Advil)  Insulin (Humulin)  Insulin (Levemir)  Lactulose  Levothyroxine  Lubiprostone (Amitiza)  Metoclopramide (Reglan)  Pantoprazole (Protonix)  Potassium  Pramipexole (Mirapex)  Sucralfate (Carafate)  Topical Diclofenac (Voltaren)  Trazodone (Desyrel)  Zolpidem (Ambien) ==================================================================== For clinical consultation, please call [phone]. ====================================================================    Laboratory Chemistry Profile (12 mo)  Renal: 08/01/2019: BUN 10; Creatinine, Ser 0.64  Lab Results  Component Value Date   GFRAA >60 08/01/2019   GFRNONAA >60 08/01/2019   Hepatic: 07/30/2019: Albumin 3.3 Lab Results  Component Value Date   AST 39 07/30/2019   ALT 34 07/30/2019   Other: No results found for requested labs within last 8760 hours. Note: Above Lab results reviewed.  Imaging  Last 90 days:  Ct Head Wo Contrast  Result Date: 07/30/2019 CLINICAL DATA:  Fall EXAM: CT HEAD WITHOUT CONTRAST TECHNIQUE: Contiguous axial images were obtained from the base of the skull through the vertex without intravenous contrast. COMPARISON:  10/22/2014 FINDINGS: Brain: There is no mass, hemorrhage or extra-axial collection. The size and configuration of the ventricles and extra-axial CSF spaces are normal. There is hypoattenuation of the white matter, most commonly indicating chronic small vessel disease. Vascular: No abnormal hyperdensity of the major intracranial arteries or dural venous sinuses. No intracranial atherosclerosis. Skull: The visualized skull base, calvarium and extracranial soft tissues are normal. Sinuses/Orbits: No fluid levels or advanced mucosal thickening of the visualized paranasal sinuses. No mastoid or middle ear effusion. The orbits are  normal. IMPRESSION: Chronic small vessel ischemia without acute intracranial abnormality. Electronically Signed   By: Ulyses Jarred M.D.   On: 07/30/2019 19:57   US Abdomen Limited  Result Date: 07/06/2019 CLINICAL DATA:  Abdominal distension. EXAM: ULTRASOUND ABDOMEN LIMITED COMPARISON:  None. FINDINGS: No ascites. IMPRESSION: No ascites. Electronically Signed   By: Dorise Bullion III M.D   On: 07/06/2019 17:29   US Venous Img Lower Unilateral Right  Result Date: 07/31/2019 CLINICAL DATA:  Diffuse right leg pain for several months EXAM: RIGHT LOWER EXTREMITY VENOUS DOPPLER ULTRASOUND TECHNIQUE: Gray-scale sonography with graded compression, as well as color Doppler and duplex ultrasound were performed to evaluate the lower extremity deep venous systems from the level of the common femoral vein and including the common femoral, femoral, profunda femoral, popliteal and calf veins including the posterior tibial, peroneal and gastrocnemius veins when visible. The superficial great saphenous vein was also interrogated. Spectral Doppler was utilized to evaluate flow at rest and with distal augmentation maneuvers in the common femoral, femoral and popliteal veins. COMPARISON:  None. FINDINGS: Contralateral Common Femoral Vein: Respiratory phasicity is normal and symmetric with the symptomatic side. No evidence of thrombus. Normal compressibility. Common Femoral Vein: No evidence of thrombus. Saphenofemoral Junction: No evidence of thrombus. Profunda Femoral Vein: No evidence of thrombus. Femoral Vein: No evidence of thrombus. Popliteal Vein: No evidence of thrombus. Calf Veins: No evidence of thrombus. IMPRESSION: No evidence of right lower extremity deep venous thrombosis. Electronically Signed   By: Monte Fantasia M.D.   On: 07/31/2019 04:19   Dg Chest Port 1 View  Result Date: 07/30/2019 CLINICAL DATA:  Patient fell last night.  Instability. EXAM: PORTABLE CHEST 1 VIEW COMPARISON:  Radiographs 10/22/2014.   CT 05/25/2017. FINDINGS: 1544 hours. Suboptimal inspiration. The heart size and mediastinal contours are normal. The lungs are clear. There is no pleural effusion or pneumothorax. No acute osseous findings are identified. Previous lower cervical fusion noted. IMPRESSION: No evidence of active cardiopulmonary process. Electronically Signed   By: Richardean Sale M.D.   On: 07/30/2019 16:09   Dg Knee Complete 4 Views Right  Result Date: 07/30/2019 CLINICAL DATA:  Right knee pain.  Status post fall. EXAM: RIGHT KNEE - COMPLETE 4+ VIEW COMPARISON:  X-ray of the right tibia and fibula October 22, 2014 FINDINGS: There is no acute fracture or dislocation. Degenerative joint changes with narrow femoral tibial joint  space and osteophyte formation are noted. The soft tissues are normal. IMPRESSION: No acute fracture or dislocation noted. Degenerative joint changes of right knee. Electronically Signed   By: Abelardo Diesel M.D.   On: 07/30/2019 16:11   Dg Pain Clinic C-arm 1-60 Min No Report  Result Date: 07/18/2019 Fluoro was used, but no Radiologist interpretation will be provided. Please refer to "NOTES" tab for provider progress note.   Assessment  The encounter diagnosis was Primary osteoarthritis of right knee.   1. Primary osteoarthritis of right knee -Status post diagnostic right knee genicular nerve block on 07/18/2019.  Patient states that this block provided greater than 50% pain relief for 5 days although postprocedure assessment was complicated by fall that led to hospital admission for UTI associated sepsis.  We discussed risks and benefits of right knee genicular nerve block for her right knee osteoarthritis and associated pain.  Risks and benefits reviewed and patient like to proceed. - Radiofrequency,Genicular; Future  Plan of Care  I am having Brittney [name] "Pam" maintain her ALPRAZolam, diclofenac sodium, doxazosin, escitalopram, hydrALAZINE, metoCLOPramide, pantoprazole, traZODone,  zolpidem, insulin regular human CONCENTRATED, albuterol, butalbital-acetaminophen-caffeine, fluticasone, Insulin Detemir, pramipexole, bumetanide, carvedilol, cloNIDine, levothyroxine, cyclobenzaprine, traMADol, ibuprofen, candesartan, oxyCODONE-acetaminophen, Linzess, ondansetron, gabapentin, potassium chloride, and fluticasone.  Orders:  Orders Placed This Encounter  Procedures  . Radiofrequency,Genicular    Standing Status:   Future    Standing Expiration Date:   02/14/2021    Scheduling Instructions:     Side(s): RIGHT KNEE      Level(s): Superior-Lateral, Superior-Medial, and Inferior-Medial Genicular Nerve(s)     Sedation: With Sedation     Scheduling Timeframe: As soon as pre-approved    Order Specific Question:   Where will this procedure be performed?    Answer:   [facility]   Follow-up plan:   Return in about 2 weeks (around 08/29/2019) for Procedure right knee genicular RFA with sedation ASAP block 30 minutes.     s/p GNB (R) 07/18/19-helpful, provided greater than 50% pain relief for 5 days however patient did sustain a fall due to UTI associated sepsis did spend 3 days in the hospital.  Prior to her fall she was obtaining significant pain relief with genicular nerve block.  Plan for genicular nerve RFA.    Recent Visits Date Type Provider Dept  07/18/19 Procedure visit [name], MD [facility]  07/10/19 Office Visit [name], MD [facility]  05/22/19 Office Visit [name], MD [facility]  Showing recent visits within past 90 days and meeting all other requirements   Today's Visits Date Type Provider Dept  08/15/19 Office Visit [name], MD [facility]  Showing today's visits and meeting all other requirements   Future Appointments Date Type Provider Dept  09/06/19 Appointment [name], MD [facility]  Showing future appointments within next 90 days and meeting all other requirements   I  discussed the assessment and treatment plan with the patient. The patient was provided an opportunity to ask questions and all were answered. The patient agreed with the plan and demonstrated an understanding of the instructions.  Patient advised to call back or seek an in-person evaluation if the symptoms or condition worsens.  Total duration of non-face-to-face encounter: 25 minutes.  Note by: [name], MD Date: 08/15/2019; Time: 3:17 PM  Note: This dictation was prepared with Dragon dictation. Any transcriptional errors that may result from this process are unintentional.  Disclaimer:  * Given  the special circumstances of the COVID-19 pandemic, the federal government has announced that the Office for Civil Rights (OCR) will exercise its enforcement discretion and will not impose penalties on physicians using telehealth in the event of noncompliance with regulatory requirements under the [place] (HIPAA) in connection with the good faith provision of telehealth during the HTXHF-41 national public health emergency. ([place])

## 2019-08-29 ENCOUNTER — Ambulatory Visit (HOSPITAL_BASED_OUTPATIENT_CLINIC_OR_DEPARTMENT_OTHER): Payer: Medicare Other | Admitting: Student in an Organized Health Care Education/Training Program

## 2019-08-29 ENCOUNTER — Ambulatory Visit
Admission: RE | Admit: 2019-08-29 | Discharge: 2019-08-29 | Disposition: A | Discharge status: Home / Self Care | Payer: Medicare Other | Source: Ambulatory Visit | Attending: Student in an Organized Health Care Education/Training Program | Admitting: Student in an Organized Health Care Education/Training Program

## 2019-08-29 ENCOUNTER — Encounter: Payer: Self-pay | Admitting: Student in an Organized Health Care Education/Training Program

## 2019-08-29 ENCOUNTER — Other Ambulatory Visit: Payer: Self-pay

## 2019-08-29 VITALS — BP 144/56 | HR 62 | Temp 97.3°F | Resp 16 | Ht 66.0 in | Wt 314.0 lb

## 2019-08-29 DIAGNOSIS — M1711 Unilateral primary osteoarthritis, right knee: Secondary | ICD-10-CM

## 2019-08-29 MED ORDER — ROPIVACAINE HCL 2 MG/ML IJ SOLN
INTRAMUSCULAR | Status: AC
  Filled 2019-08-29: qty 10

## 2019-08-29 MED ORDER — ROPIVACAINE HCL 2 MG/ML IJ SOLN
1.0000 mL | Freq: Once | INTRAMUSCULAR | Status: AC
  Administered 2019-08-29: 1 mL via EPIDURAL

## 2019-08-29 MED ORDER — FENTANYL CITRATE (PF) 100 MCG/2ML IJ SOLN
25.0000 ug | INTRAMUSCULAR | Status: DC | PRN
  Administered 2019-08-29: 50 ug via INTRAVENOUS

## 2019-08-29 MED ORDER — LIDOCAINE HCL 2 % IJ SOLN
INTRAMUSCULAR | Status: AC
  Filled 2019-08-29: qty 20

## 2019-08-29 MED ORDER — LIDOCAINE HCL 2 % IJ SOLN
20.0000 mL | Freq: Once | INTRAMUSCULAR | Status: AC
  Administered 2019-08-29: 400 mg

## 2019-08-29 MED ORDER — FENTANYL CITRATE (PF) 100 MCG/2ML IJ SOLN
INTRAMUSCULAR | Status: AC
  Filled 2019-08-29: qty 2

## 2019-08-29 NOTE — Progress Notes (Signed)
Safety precautions to be maintained throughout the outpatient stay will include: orient to surroundings, keep bed in low position, maintain call bell within reach at all times, provide assistance with transfer out of bed and ambulation.  

## 2019-08-29 NOTE — Progress Notes (Signed)
Patient's Name: Brittney [name]  MRN: [id number]  Referring Provider: Rusty Aus, MD  DOB: July 18, 1951  PCP: Rusty Aus, MD  DOS: 08/29/2019  Note by: [name], MD  Service setting: Ambulatory outpatient  Specialty: Interventional Pain Management  Patient type: Established  Location: [facility] (AMB) Pain Management Facility  Visit type: Interventional Procedure   Primary Reason for Visit: Interventional Pain Management Treatment. CC: Knee Pain (right)  Procedure:          Anesthesia, Analgesia, Anxiolysis:  Type: Therapeutic Superior-lateral, Superior-medial, and Inferior-medial, Genicular Nerve Radiofrequency Ablation.  #1  Region: Lateral, Anterior, and Medial aspects of the knee joint, above and below the knee joint proper. Level: Superior and inferior to the knee joint. Laterality: Right  Type: Moderate (Conscious) Sedation combined with Local Anesthesia Indication(s): Analgesia and Anxiety Route: Intravenous (IV) IV Access: Secured Sedation: Meaningful verbal contact was maintained at all times during the procedure  Local Anesthetic: Lidocaine 1-2%  Position: Supine   Indications: 1. Primary osteoarthritis of right knee    Ms. Kottler has been dealing with the above chronic pain for longer than three months and has either failed to respond, was unable to tolerate, or simply did not get enough benefit from other more conservative therapies including, but not limited to: 1. Over-the-counter medications 2. Anti-inflammatory medications 3. Muscle relaxants 4. Membrane stabilizers 5. Opioids 6. Physical therapy and/or chiropractic manipulation 7. Modalities (Heat, ice, etc.) 8. Invasive techniques such as nerve blocks. Ms. Dahlen has attained more than 50% relief of the pain from a series of diagnostic injections conducted in separate occasions.  Pain Score: Pre-procedure: 7 /10 Post-procedure: 0-No pain/10  Pre-op Assessment:  Ms. Manbeck is a 68 y.o. (year old), female  patient, seen today for interventional treatment. She  has a past surgical history that includes Cesarean section (1980); Dilation and curettage of uterus; Eye surgery (1986); Abdominal hysterectomy; Tonsillectomy; Esophagogastroduodenoscopy (02/08/2014); Colonoscopy (05/04/2001); Esophagogastroduodenoscopy (egd) with propofol (N/A, 09/20/2016); Colonoscopy with propofol (N/A, 09/20/2016); Mohs surgery (11/2016); Cholecystectomy; Hernia repair; Joint replacement (Left, 09/24/2013); Back surgery (04/1999); Pulse generator implant (N/A, 11/02/2017); and Esophagogastroduodenoscopy (egd) with propofol (N/A, 05/23/2018). Ms. Tijerino has a current medication list which includes the following prescription(s): albuterol, alprazolam, bumetanide, butalbital-acetaminophen-caffeine, candesartan, carvedilol, clonidine, cyclobenzaprine, diclofenac sodium, doxazosin, escitalopram, fluticasone, fluticasone, furosemide, gabapentin, hydralazine, ibuprofen, insulin detemir, insulin regular human concentrated, levothyroxine, linzess, metoclopramide, nitroglycerin, ondansetron, oxycodone-acetaminophen, pantoprazole, potassium chloride, potassium chloride, pramipexole, tramadol, trazodone, zolpidem, and ciprofloxacin, and the following Facility-Administered Medications: fentanyl. Her primarily concern today is the Knee Pain (right)  Initial Vital Signs:  Pulse/HCG Rate: 62ECG Heart Rate: 63 Temp: 98.6 F (37 C) Resp: 18 BP: (!) 113/52 SpO2: 95 %  BMI: Estimated body mass index is 50.68 kg/m as calculated from the following:   Height as of this encounter: 5\' 6"  (1.676 m).   Weight as of this encounter: 314 lb (142.4 kg).  Risk Assessment: Allergies: Reviewed. She is allergic to eggs or egg-derived products; shellfish allergy; amlodipine; codeine; imdur [isosorbide dinitrate]; lyrica [pregabalin]; monosodium glutamate; and tape.  Allergy Precautions: None required Coagulopathies: Reviewed. None identified.  Blood-thinner  therapy: None at this time Active Infection(s): Reviewed. None identified. Ms. Kasler is afebrile  Site Confirmation: Ms. Tio was asked to confirm the procedure and laterality before marking the site Procedure checklist: Completed Consent: Before the procedure and under the influence of no sedative(s), amnesic(s), or anxiolytics, the patient was informed of the treatment options, risks and possible complications. To fulfill our ethical and legal obligations, as  recommended by the American Medical Association's Code of Ethics, I have informed the patient of my clinical impression; the nature and purpose of the treatment or procedure; the risks, benefits, and possible complications of the intervention; the alternatives, including doing nothing; the risk(s) and benefit(s) of the alternative treatment(s) or procedure(s); and the risk(s) and benefit(s) of doing nothing. The patient was provided information about the general risks and possible complications associated with the procedure. These may include, but are not limited to: failure to achieve desired goals, infection, bleeding, organ or nerve damage, allergic reactions, paralysis, and death. In addition, the patient was informed of those risks and complications associated to the procedure, such as failure to decrease pain; infection; bleeding; organ or nerve damage with subsequent damage to sensory, motor, and/or autonomic systems, resulting in permanent pain, numbness, and/or weakness of one or several areas of the body; allergic reactions; (i.e.: anaphylactic reaction); and/or death. Furthermore, the patient was informed of those risks and complications associated with the medications. These include, but are not limited to: allergic reactions (i.e.: anaphylactic or anaphylactoid reaction(s)); adrenal axis suppression; blood sugar elevation that in diabetics may result in ketoacidosis or comma; water retention that in patients with history of  congestive heart failure may result in shortness of breath, pulmonary edema, and decompensation with resultant heart failure; weight gain; swelling or edema; medication-induced neural toxicity; particulate matter embolism and blood vessel occlusion with resultant organ, and/or nervous system infarction; and/or aseptic necrosis of one or more joints. Finally, the patient was informed that Medicine is not an exact science; therefore, there is also the possibility of unforeseen or unpredictable risks and/or possible complications that may result in a catastrophic outcome. The patient indicated having understood very clearly. We have given the patient no guarantees and we have made no promises. Enough time was given to the patient to ask questions, all of which were answered to the patient's satisfaction. Ms. Ratledge has indicated that she wanted to continue with the procedure. Attestation: I, the ordering provider, attest that I have discussed with the patient the benefits, risks, side-effects, alternatives, likelihood of achieving goals, and potential problems during recovery for the procedure that I have provided informed consent. Date  Time: 08/29/2019  9:34 AM  Pre-Procedure Preparation:  Monitoring: As per clinic protocol. Respiration, ETCO2, SpO2, BP, heart rate and rhythm monitor placed and checked for adequate function Safety Precautions: Patient was assessed for positional comfort and pressure points before starting the procedure. Time-out: I initiated and conducted the "Time-out" before starting the procedure, as per protocol. The patient was asked to participate by confirming the accuracy of the "Time Out" information. Verification of the correct person, site, and procedure were performed and confirmed by me, the nursing staff, and the patient. "Time-out" conducted as per Joint Commission's Universal Protocol (UP.01.01.01). Time: 1315  Description of Procedure:          Target Area: For Genicular  Nerve block(s), the targets are: the superior-lateral genicular nerve, located in the lateral distal portion of the femoral shaft as it curves to form the lateral epicondyle, in the region of the distal femoral metaphysis; the superior-medial genicular nerve, located in the medial distal portion of the femoral shaft as it curves to form the medial epicondyle; and the inferior-medial genicular nerve, located in the medial, proximal portion of the tibial shaft, as it curves to form the medial epicondyle, in the region of the proximal tibial metaphysis. Approach: Anterior, ipsilateral approach. Area Prepped: Entire knee area, from mid-thigh to  mid-shin, lateral, anterior, and medial aspects. Prepping solution: DuraPrep (Iodine Povacrylex [0.7% available iodine] and Isopropyl Alcohol, 74% w/w) Safety Precautions: Aspiration looking for blood return was conducted prior to all injections. At no point did we inject any substances, as a needle was being advanced. No attempts were made at seeking any paresthesias. Safe injection practices and needle disposal techniques used. Medications properly checked for expiration dates. SDV (single dose vial) medications used. Description of the Procedure: Protocol guidelines were followed. The patient was placed in position over the procedure table. The target area was identified and the area prepped in the usual manner. The skin and muscle were infiltrated with local anesthetic. Appropriate amount of time allowed to pass for local anesthetics to take effect. Radiofrequency needles were introduced to the target area using fluoroscopic guidance. Using the NeuroTherm NT1100 Radiofrequency Generator, sensory stimulation using 50 Hz was used to locate & identify the nerve, making sure that the needle was positioned such that there was no sensory stimulation below 0.3 V or above 0.7 V. Stimulation using 2 Hz was used to evaluate the motor component. Care was taken not to lesion any  nerves that demonstrated motor stimulation of the lower extremities at an output of less than 2.5 times that of the sensory threshold, or a maximum of 2.0 V. Once satisfactory placement of the needles was achieved, the numbing solution was slowly injected after negative aspiration. After waiting for at least 2 minutes, the ablation was performed at 80 degrees C for 60 seconds, using regular Radiofrequency settings. Once the procedure was completed, the needles were then removed and the area cleansed, making sure to leave some of the prepping solution back to take advantage of its long term bactericidal properties. Intra-operative Compliance: Compliant Vitals:   08/29/19 1340 08/29/19 1350 08/29/19 1401 08/29/19 1411  BP: (!) 165/85 (!) 140/58 (!) 160/62 (!) 144/56  Pulse:      Resp: 11 12 14 16   Temp:  (!) 97.3 F (36.3 C)    SpO2: 96% 94% 95% 96%  Weight:      Height:        Start Time: 1315 hrs. End Time: 1340 hrs. Materials & Medications:  Needle(s) Type: Teflon-coated, curved tip, Radiofrequency needle(s) Gauge: 22G Length: 10cm Medication(s): Please see orders for medications and dosing details. 1 cc of 0.2% ropivacaine injected at each level above after sensorimotor testing prior to lesioning.  No steroids used. Imaging Guidance (Non-Spinal):          Type of Imaging Technique: Fluoroscopy Guidance (Non-Spinal) Indication(s): Assistance in needle guidance and placement for procedures requiring needle placement in or near specific anatomical locations not easily accessible without such assistance. Exposure Time: Please see nurses notes. Contrast: Before injecting any contrast, we confirmed that the patient did not have an allergy to iodine, shellfish, or radiological contrast. Once satisfactory needle placement was completed at the desired level, radiological contrast was injected. Contrast injected under live fluoroscopy. No contrast complications. See chart for type and volume of  contrast used. Fluoroscopic Guidance: I was personally present during the use of fluoroscopy. "Tunnel Vision Technique" used to obtain the best possible view of the target area. Parallax error corrected before commencing the procedure. "Direction-depth-direction" technique used to introduce the needle under continuous pulsed fluoroscopy. Once target was reached, antero-posterior, oblique, and lateral fluoroscopic projection used confirm needle placement in all planes. Images permanently stored in EMR. Interpretation: I personally interpreted the imaging intraoperatively. Adequate needle placement confirmed in multiple planes. Appropriate spread of  contrast into desired area was observed. No evidence of afferent or efferent intravascular uptake. Permanent images saved into the patient's record.  Antibiotic Prophylaxis:   Anti-infectives (From admission, onward)   None     Indication(s): None identified  Post-operative Assessment:  Post-procedure Vital Signs:  Pulse/HCG Rate: 6263 Temp: (!) 97.3 F (36.3 C) Resp: 16 BP: (!) 144/56 SpO2: 96 %  EBL: None  Complications: No immediate post-treatment complications observed by team, or reported by patient.  Note: The patient tolerated the entire procedure well. A repeat set of vitals were taken after the procedure and the patient was kept under observation following institutional policy, for this type of procedure. Post-procedural neurological assessment was performed, showing return to baseline, prior to discharge. The patient was provided with post-procedure discharge instructions, including a section on how to identify potential problems. Should any problems arise concerning this procedure, the patient was given instructions to immediately contact us, at any time, without hesitation. In any case, we plan to contact the patient by telephone for a follow-up status report regarding this interventional procedure.  Comments:  No additional relevant  information.  Plan of Care  Orders:  Orders Placed This Encounter  Procedures  . DG PAIN [facility] C-ARM 1-60 MIN NO REPORT    Intraoperative interpretation by procedural physician at [place].    Standing Status:   Standing    Number of Occurrences:   1    Order Specific Question:   Reason for exam:    Answer:   Assistance in needle guidance and placement for procedures requiring needle placement in or near specific anatomical locations not easily accessible without such assistance.    Medications ordered for procedure: Meds ordered this encounter  Medications  . lidocaine (XYLOCAINE) 2 % (with pres) injection 400 mg  . fentaNYL (SUBLIMAZE) injection 25-50 mcg    Make sure Narcan is available in the pyxis when using this medication. In the event of respiratory depression (RR< 8/min): Titrate NARCAN (naloxone) in increments of 0.1 to 0.2 mg IV at 2-3 minute intervals, until desired degree of reversal.  . ropivacaine (PF) 2 mg/mL (0.2%) (NAROPIN) injection 1 mL   Medications administered: We administered lidocaine, fentaNYL, and ropivacaine (PF) 2 mg/mL (0.2%).  See the medical record for exact dosing, route, and time of administration.  Follow-up plan:   Return in about 6 weeks (around 10/10/2019) for Post Procedure Evaluation, virtual.      s/p GNB (R) 07/18/19-helpful, provided greater than 50% pain relief for 5 days however patient did sustain a fall due to UTI associated sepsis did spend 3 days in the hospital.  Prior to her fall she was obtaining significant pain relief with genicular nerve block.  Status post right genicular RFA on 08/29/2019.  No steroid used.     Recent Visits Date Type Provider Dept  08/15/19 Office Visit [name], MD [facility]  07/18/19 Procedure visit [name], MD [facility]  07/10/19 Office Visit [name], MD [facility]  Showing recent visits within past 90 days and meeting all other requirements    Today's Visits Date Type Provider Dept  08/29/19 Procedure visit [name], MD [facility]  Showing today's visits and meeting all other requirements   Future Appointments Date Type Provider Dept  09/06/19 Appointment [name], MD [facility]  10/10/19 Appointment [name], MD [facility]  Showing future appointments within next 90 days and meeting all other requirements   Disposition:  Discharge home  Discharge Date & Time: 08/29/2019; 1415 hrs.   Primary Care Physician: Rusty Aus, MD Location: [facility] Note by: [name], MD Date: 08/29/2019; Time: 4:13 PM  Disclaimer:  Medicine is not an exact science. The only guarantee in medicine is that nothing is guaranteed. It is important to note that the decision to proceed with this intervention was based on the information collected from the patient. The Data and conclusions were drawn from the patient's questionnaire, the interview, and the physical examination. Because the information was provided in large part by the patient, it cannot be guaranteed that it has not been purposely or unconsciously manipulated. Every effort has been made to obtain as much relevant data as possible for this evaluation. It is important to note that the conclusions that lead to this procedure are derived in large part from the available data. Always take into account that the treatment will also be dependent on availability of resources and existing treatment guidelines, considered by other Pain Management Practitioners as being common knowledge and practice, at the time of the intervention. For Medico-Legal purposes, it is also important to point out that variation in procedural techniques and pharmacological choices are the acceptable norm. The indications, contraindications, technique, and results of the above procedure should only be interpreted and judged by a Board-Certified  Interventional Pain Specialist with extensive familiarity and expertise in the same exact procedure and technique.

## 2019-08-29 NOTE — Patient Instructions (Signed)

## 2019-08-30 ENCOUNTER — Telehealth: Payer: Self-pay | Admitting: *Deleted

## 2019-08-30 NOTE — Telephone Encounter (Signed)
Spoke with patient, states her knee is quite sore, which is to be expected.  Asked if she had used her ice, she states she actually forgot all about it.  Encouraged patient to try and use the ice today to keep inflammation down which will help the level of pain.  Patient to call if she needs anything.

## 2019-09-05 ENCOUNTER — Encounter: Payer: Self-pay | Admitting: Student in an Organized Health Care Education/Training Program

## 2019-09-06 ENCOUNTER — Other Ambulatory Visit: Payer: Self-pay

## 2019-09-06 ENCOUNTER — Encounter: Payer: Self-pay | Admitting: Student in an Organized Health Care Education/Training Program

## 2019-09-06 ENCOUNTER — Ambulatory Visit (INDEPENDENT_AMBULATORY_CARE_PROVIDER_SITE_OTHER): Payer: Medicare Other | Admitting: Vascular Surgery

## 2019-09-06 ENCOUNTER — Ambulatory Visit
Payer: Medicare Other | Attending: Student in an Organized Health Care Education/Training Program | Admitting: Student in an Organized Health Care Education/Training Program

## 2019-09-06 DIAGNOSIS — M797 Fibromyalgia: Secondary | ICD-10-CM

## 2019-09-06 DIAGNOSIS — G894 Chronic pain syndrome: Secondary | ICD-10-CM

## 2019-09-06 DIAGNOSIS — Z9689 Presence of other specified functional implants: Secondary | ICD-10-CM

## 2019-09-06 DIAGNOSIS — M961 Postlaminectomy syndrome, not elsewhere classified: Secondary | ICD-10-CM

## 2019-09-06 DIAGNOSIS — M47816 Spondylosis without myelopathy or radiculopathy, lumbar region: Secondary | ICD-10-CM

## 2019-09-06 DIAGNOSIS — M4804 Spinal stenosis, thoracic region: Secondary | ICD-10-CM

## 2019-09-06 DIAGNOSIS — M48061 Spinal stenosis, lumbar region without neurogenic claudication: Secondary | ICD-10-CM

## 2019-09-06 DIAGNOSIS — M1711 Unilateral primary osteoarthritis, right knee: Secondary | ICD-10-CM

## 2019-09-06 DIAGNOSIS — M5416 Radiculopathy, lumbar region: Secondary | ICD-10-CM

## 2019-09-06 DIAGNOSIS — Z9889 Other specified postprocedural states: Secondary | ICD-10-CM

## 2019-09-06 MED ORDER — OXYCODONE-ACETAMINOPHEN 10-325 MG PO TABS: 1.0000 | ORAL_TABLET | Freq: Two times a day (BID) | ORAL | 0 refills | Status: AC | PRN

## 2019-09-06 NOTE — Progress Notes (Signed)
Pain Management Virtual Encounter Note - Virtual Visit via [place] (real-time audio visits between healthcare provider and patient).   Patient's Phone No. & Preferred Pharmacy:  [phone] (home); [phone] (mobile); (Preferred) [phone] [email or web address]  [id number] - [place], [place] - [address] [place] [address] Phone: [phone] Fax: [phone]    Pre-screening note:  Our staff contacted Brittney [name] and offered her an "in person", "face-to-face" appointment versus a telephone encounter. She indicated preferring the telephone encounter, at this time.   Reason for Virtual Visit: COVID-19*  Social distancing based on CDC and AMA recommendations.   I contacted Brittney [name] on 09/06/2019 via video conference.      I clearly identified myself as [name], MD. I verified that I was speaking with the correct person using two identifiers (Name: Brittney [name], and date of birth: 1951/05/20).  Advanced Informed Consent I sought verbal advanced consent from Brittney [name] for virtual visit interactions. I informed Brittney [name] of possible security and privacy concerns, risks, and limitations associated with providing "not-in-person" medical evaluation and management services. I also informed Brittney [name] of the availability of "in-person" appointments. Finally, I informed her that there would be a charge for the virtual visit and that she could be  personally, fully or partially, financially responsible for it. Brittney [name] expressed understanding and agreed to proceed.   Historic Elements   Brittney [name] is a 68 y.o. year old, female patient evaluated today after her last encounter by our practice on 08/30/2019. Brittney [name]  has a past medical history of Abdominal pain, left upper quadrant (12/11/2012), Arthritis, Asthma, Broken leg (2014), Cancer ([place]) (2007), Chronic kidney disease, stage 2 (mild), Chronic lower back pain,  Collagen vascular disease ([place]), Coronary artery disease, Diabetes mellitus without complication ([place]), Diabetic nephropathy associated with secondary diabetes mellitus ([place]), Flushing (12/11/2012), Gastroparesis, Gross hematuria (12/11/2012), Hepatic cirrhosis ([place]), Hyperlipidemia, Hypertension, Hypothyroidism, Hypothyroidism, IBS (irritable bowel syndrome), Kidney stone (12/11/2012), Lower extremity edema, Morbid obesity with BMI of 40.0-44.9, adult ([place]), Myocardial infarction ([place]) (09/2013), Nausea without vomiting (12/11/2012), Nephrolithiasis, Nephrolithiasis (04/26/2014), Nonproliferative retinopathy due to secondary diabetes ([place]), Numbness and tingling of right leg, Peripheral vascular disease ([place]), Renal colic (123456), Renal insufficiency, Sciatica, Sciatica (12/11/2012), Skin cancer (08/2018), Sleep apnea, and Unilateral small kidney without contralateral hypertrophy. She also  has a past surgical history that includes Cesarean section (1980); Dilation and curettage of uterus; Eye surgery (1986); Abdominal hysterectomy; Tonsillectomy; Esophagogastroduodenoscopy (02/08/2014); Colonoscopy (05/04/2001); Esophagogastroduodenoscopy (egd) with propofol (N/A, 09/20/2016); Colonoscopy with propofol (N/A, 09/20/2016); Mohs surgery (11/2016); Cholecystectomy; Hernia repair; Joint replacement (Left, 09/24/2013); Back surgery (04/1999); Pulse generator implant (N/A, 11/02/2017); and Esophagogastroduodenoscopy (egd) with propofol (N/A, 05/23/2018). Brittney [name] has a current medication list which includes the following prescription(s): albuterol, alprazolam, bumetanide, butalbital-acetaminophen-caffeine, candesartan, carvedilol, ciprofloxacin, clonidine, cyclobenzaprine, diclofenac sodium, doxazosin, escitalopram, fluticasone, fluticasone, furosemide, gabapentin, hydralazine, ibuprofen, insulin detemir, insulin regular human concentrated, levothyroxine, linzess, metoclopramide, nitroglycerin, ondansetron,  oxycodone-acetaminophen, oxycodone-acetaminophen, oxycodone-acetaminophen, pantoprazole, potassium chloride, potassium chloride, pramipexole, tramadol, trazodone, and zolpidem. She  reports that she has never smoked. She has never used smokeless tobacco. She reports that she does not drink alcohol or use drugs. Brittney [name] is allergic to eggs or egg-derived products; shellfish allergy; amlodipine; codeine; imdur [isosorbide dinitrate]; lyrica [pregabalin]; monosodium glutamate; and tape.   HPI  Today, she is being contacted for medication management.   No change in medical history since last visit.  Patient's pain is at baseline.  Patient continues multimodal  pain regimen as prescribed.  States that it provides pain relief and improvement in functional status.  Patient states that she is noticing benefit in regards to her right knee pain after her right knee genicular nerve radiofrequency ablation performed last week.  Patient will hopefully continue to obtain benefit in the upcoming weeks.  We have a postprocedural follow-up appointment next month.  In regards to medication management, she states that medications help her function.  We had repeated discussions about her being on Xanax and Ambien.  Patient takes 10 mg of oxycodone in the morning and then in the afternoon.  She takes Xanax in the evening with Ambien.  I have repeatedly counseled her on the risk of respiratory depression and cardiovascular risk especially the context of her morbid obesity and OSA.  Patient does have a complex medical history and her chronic pain from her significant lumbar spine pathology is a significant contributor to her  morbidity, suffering and restrictions and ADLs.  Patient has been compliant with therapy so far and has a difficult social situation as well.  Pharmacotherapy Assessment  Analgesic:  08/23/2019  1   07/10/2019  Oxycodone-Acetaminophen 10-325  60.00  30 Bi Lat   [id number]   Nor (1409)   0  30.00 MME   Medicare   [place]    Monitoring: Pharmacotherapy: No side-effects or adverse reactions reported. [place] PMP: PDMP reviewed during this encounter.       Compliance: No problems identified. Effectiveness: Clinically acceptable. Plan: Refer to "POC".  UDS:  Summary  Date Value Ref Range Status  01/03/2019 FINAL  Final    Comment:    ==================================================================== TOXASSURE SELECT 13 (MW) ==================================================================== Test                             Result       Flag       Units Drug Present and Declared for Prescription Verification   Alprazolam                     165          EXPECTED   ng/mg creat   Alpha-hydroxyalprazolam        412          EXPECTED   ng/mg creat    Source of alprazolam is a scheduled prescription medication.    Alpha-hydroxyalprazolam is an expected metabolite of alprazolam.   Oxycodone                      812          EXPECTED   ng/mg creat   Oxymorphone                    932          EXPECTED   ng/mg creat   Noroxycodone                   1294         EXPECTED   ng/mg creat   Noroxymorphone                 332          EXPECTED   ng/mg creat    Sources of oxycodone are scheduled prescription medications.    Oxymorphone, noroxycodone, and noroxymorphone are expected    metabolites of oxycodone. Oxymorphone is also available as a  scheduled prescription medication.   Tramadol                       2565         EXPECTED   ng/mg creat   O-Desmethyltramadol            7935         EXPECTED   ng/mg creat   N-Desmethyltramadol            2141         EXPECTED   ng/mg creat    Source of tramadol is a prescription medication.    O-desmethyltramadol and N-desmethyltramadol are expected    metabolites of tramadol.   Butalbital                     PRESENT      EXPECTED ==================================================================== Test                      Result    Flag   Units      Ref  Range   Creatinine              34               mg/dL      >=20 ==================================================================== Declared Medications:  The flagging and interpretation on this report are based on the  following declared medications.  Unexpected results may arise from  inaccuracies in the declared medications.  **Note: The testing scope of this panel includes these medications:  Alprazolam  Butalbital (Butalbital/APAP/Caffeine)  Oxycodone (Percocet)  Tramadol (Ul[place])  **Note: The testing scope of this panel does not include following  reported medications:  Acetaminophen (Butalbital/APAP/Caffeine)  Acetaminophen (Percocet)  Albuterol  Bumetanide  Caffeine (Butalbital/APAP/Caffeine)  Candesartan  Carvedilol  Clonidine  Cyclobenzaprine (Flexeril)  Doxazosin (Cardura)  Escitalopram (Lexapro)  Fluticasone (Flonase)  Fluticasone (Flovent)  Gabapentin (Neurontin)  Hydralazine (Apresoline)  Ibuprofen (Advil)  Insulin (Humulin)  Insulin (Levemir)  Lactulose  Levothyroxine  Lubiprostone (Amitiza)  Metoclopramide (Reglan)  Pantoprazole (Protonix)  Potassium  Pramipexole (Mirapex)  Sucralfate (Carafate)  Topical Diclofenac (Voltaren)  Trazodone (Desyrel)  Zolpidem (Ambien) ==================================================================== For clinical consultation, please call [phone]. ====================================================================    Laboratory Chemistry Profile (12 mo)  Renal: 08/01/2019: BUN 10; Creatinine, Ser 0.64  Lab Results  Component Value Date   GFRAA >60 08/01/2019   GFRNONAA >60 08/01/2019   Hepatic: 07/30/2019: Albumin 3.3 Lab Results  Component Value Date   AST 39 07/30/2019   ALT 34 07/30/2019   Other: No results found for requested labs within last 8760 hours. Note: Above Lab results reviewed.  Imaging  Last 90 days:  Ct Head Wo Contrast  Result Date: 07/30/2019 CLINICAL DATA:  Fall EXAM: CT HEAD  WITHOUT CONTRAST TECHNIQUE: Contiguous axial images were obtained from the base of the skull through the vertex without intravenous contrast. COMPARISON:  10/22/2014 FINDINGS: Brain: There is no mass, hemorrhage or extra-axial collection. The size and configuration of the ventricles and extra-axial CSF spaces are normal. There is hypoattenuation of the white matter, most commonly indicating chronic small vessel disease. Vascular: No abnormal hyperdensity of the major intracranial arteries or dural venous sinuses. No intracranial atherosclerosis. Skull: The visualized skull base, calvarium and extracranial soft tissues are normal. Sinuses/Orbits: No fluid levels or advanced mucosal thickening of the visualized paranasal sinuses. No mastoid or middle ear effusion. The orbits are normal. IMPRESSION: Chronic small vessel ischemia without acute  intracranial abnormality. Electronically Signed   By: Ulyses Jarred M.D.   On: 07/30/2019 19:57   US Abdomen Limited  Result Date: 07/06/2019 CLINICAL DATA:  Abdominal distension. EXAM: ULTRASOUND ABDOMEN LIMITED COMPARISON:  None. FINDINGS: No ascites. IMPRESSION: No ascites. Electronically Signed   By: Dorise Bullion III M.D   On: 07/06/2019 17:29   US Venous Img Lower Unilateral Right  Result Date: 07/31/2019 CLINICAL DATA:  Diffuse right leg pain for several months EXAM: RIGHT LOWER EXTREMITY VENOUS DOPPLER ULTRASOUND TECHNIQUE: Gray-scale sonography with graded compression, as well as color Doppler and duplex ultrasound were performed to evaluate the lower extremity deep venous systems from the level of the common femoral vein and including the common femoral, femoral, profunda femoral, popliteal and calf veins including the posterior tibial, peroneal and gastrocnemius veins when visible. The superficial great saphenous vein was also interrogated. Spectral Doppler was utilized to evaluate flow at rest and with distal augmentation maneuvers in the common femoral,  femoral and popliteal veins. COMPARISON:  None. FINDINGS: Contralateral Common Femoral Vein: Respiratory phasicity is normal and symmetric with the symptomatic side. No evidence of thrombus. Normal compressibility. Common Femoral Vein: No evidence of thrombus. Saphenofemoral Junction: No evidence of thrombus. Profunda Femoral Vein: No evidence of thrombus. Femoral Vein: No evidence of thrombus. Popliteal Vein: No evidence of thrombus. Calf Veins: No evidence of thrombus. IMPRESSION: No evidence of right lower extremity deep venous thrombosis. Electronically Signed   By: Monte Fantasia M.D.   On: 07/31/2019 04:19   Dg Chest Port 1 View  Result Date: 07/30/2019 CLINICAL DATA:  Patient fell last night.  Instability. EXAM: PORTABLE CHEST 1 VIEW COMPARISON:  Radiographs 10/22/2014.  CT 05/25/2017. FINDINGS: 1544 hours. Suboptimal inspiration. The heart size and mediastinal contours are normal. The lungs are clear. There is no pleural effusion or pneumothorax. No acute osseous findings are identified. Previous lower cervical fusion noted. IMPRESSION: No evidence of active cardiopulmonary process. Electronically Signed   By: Richardean Sale M.D.   On: 07/30/2019 16:09   Dg Knee Complete 4 Views Right  Result Date: 07/30/2019 CLINICAL DATA:  Right knee pain.  Status post fall. EXAM: RIGHT KNEE - COMPLETE 4+ VIEW COMPARISON:  X-ray of the right tibia and fibula October 22, 2014 FINDINGS: There is no acute fracture or dislocation. Degenerative joint changes with narrow femoral tibial joint space and osteophyte formation are noted. The soft tissues are normal. IMPRESSION: No acute fracture or dislocation noted. Degenerative joint changes of right knee. Electronically Signed   By: Abelardo Diesel M.D.   On: 07/30/2019 16:11   Dg Pain Clinic C-arm 1-60 Min No Report  Result Date: 08/29/2019 Fluoro was used, but no Radiologist interpretation will be provided. Please refer to "NOTES" tab for provider progress note.  Dg  Pain Clinic C-arm 1-60 Min No Report  Result Date: 07/18/2019 Fluoro was used, but no Radiologist interpretation will be provided. Please refer to "NOTES" tab for provider progress note.   Assessment  The primary encounter diagnosis was Primary osteoarthritis of right knee. Diagnoses of L3-4 severe lumbar facet hypertrophy and spinal stenosis, L3-4 severe lumbar spinal stenosis (12/31/2016 MRI), Failed back surgical syndrome, Spinal stenosis of thoracic region, Fibromyalgia, Spinal cord stimulator status ([place] Paddle), Status post lumbar spine surgery for decompression of spinal cord, and Chronic pain syndrome were also pertinent to this visit.  Plan of Care  I am having Brittney [name] "Pam" start on oxyCODONE-acetaminophen and oxyCODONE-acetaminophen. I am also having her maintain her ALPRAZolam, diclofenac  sodium, doxazosin, escitalopram, hydrALAZINE, metoCLOPramide, pantoprazole, traZODone, zolpidem, insulin regular human CONCENTRATED, albuterol, butalbital-acetaminophen-caffeine, fluticasone, Insulin Detemir, pramipexole, bumetanide, carvedilol, cloNIDine, levothyroxine, cyclobenzaprine, traMADol, ibuprofen, candesartan, Linzess, ondansetron, gabapentin, potassium chloride, fluticasone, ciprofloxacin, furosemide, nitroGLYCERIN, potassium chloride, and oxyCODONE-acetaminophen.  I hadanextensive conversation about her current medications which include Xanax as well as Ambien. Patient also has a history of morbid obesity as well as obstructive sleep apnea, compliant on CPAP therapy. 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 I explained the risks of concomitant benzodiazepine and opioid  therapy when itcomes to cardiovascular and respiratory risk associated with concomitant therapy. I recommended the patient try and discontinue her Xanax and if she must take it to not take it within 3-4 hours of her Percocet. I also recommended the patient discontinue her Ambien as it is limiting her ability inreaching stage IV and REM sleep which could be contributing to not feeling well rested when she wakes up. Patient is a previous pharmacy tech and endorsed understanding about timing of medications. She states that she will try to take as little Xanax as possible for anxiety. I discussed opioid tolerance as well as the risks of chronic opioid therapy with this patient.  Patient is currently on gabapentin, lidocaine patches along with Voltaren gel.  Spinal cord stimulator in place as well. Patient endorsed understanding. All questions and concerns were answered. Prescription provided for 3 months below.  Repeat UDS at next visit  Pharmacotherapy (Medications Ordered): Meds ordered this encounter  Medications  . oxyCODONE-acetaminophen (PERCOCET) 10-325 MG tablet    Sig: Take 1 tablet by mouth 2 (two) times daily as needed for pain. Must last 30 days.    Dispense:  60 tablet    Refill:  0    Chronic Pain. (STOP Act - Not applicable). Fill one day early if closed on scheduled refill date.  Marland Kitchen oxyCODONE-acetaminophen (PERCOCET) 10-325 MG tablet    Sig: Take 1 tablet by mouth 2 (two) times daily as needed for pain. Must last 30 days.    Dispense:  60 tablet    Refill:  0    Chronic Pain. (STOP Act - Not applicable). Fill one day early if closed on scheduled refill date.  Marland Kitchen oxyCODONE-acetaminophen (PERCOCET) 10-325 MG tablet    Sig: Take 1 tablet by mouth 2 (two) times daily as needed for pain. Must last 30 days.    Dispense:  60 tablet    Refill:  0    Chronic Pain. (STOP Act - Not applicable). Fill one day early if closed on scheduled refill date.   Orders:  No orders of the defined  types were placed in this encounter.  Follow-up plan:   Return in about 3 months (around 12/06/2019) for Medication Management, in person (get UDS).     s/p GNB (R) 07/18/19-helpful, provided greater than 50% pain relief for 5 days however patient did sustain a fall due to UTI associated sepsis did spend 3 days in the hospital.  Prior to her fall she was obtaining significant pain relief with genicular nerve block.  Status post right genicular RFA on 08/29/2019- helpful.  No steroid used.      Recent Visits Date Type Provider Dept  08/29/19 Procedure visit [name], MD [facility]  08/15/19 Office Visit [name], MD [facility]  07/18/19 Procedure visit [name], MD [facility]  07/10/19 Office Visit [name], MD [facility]  Showing recent visits within past 90 days and meeting all other requirements   Today's Visits Date Type Provider Dept  09/06/19 Office Visit [name], MD [facility]  Showing today's visits and meeting all other requirements   Future Appointments Date Type Provider Dept  10/10/19 Appointment [name], MD [facility]  Showing future appointments within next 90 days and meeting all other requirements   I discussed the assessment and treatment plan with the patient. The patient was provided an opportunity to ask questions and all were answered. The patient agreed with the plan and demonstrated an understanding of the instructions.  Patient advised to call back or seek an in-person evaluation if the symptoms or condition worsens.  Total duration of non-face-to-face encounter: 25 minutes.  Note by: [name], MD Date: 09/06/2019; Time: 11:08 AM  Note: This dictation was prepared with Dragon dictation. Any transcriptional errors that may result from this process are unintentional.  Disclaimer:  * Given the special circumstances of the COVID-19 pandemic, the federal government  has announced that the Office for Civil Rights (OCR) will exercise its enforcement discretion and will not impose penalties on physicians using telehealth in the event of noncompliance with regulatory requirements under the [place] and [place] (HIPAA) in connection with the good faith provision of telehealth during the XX123456 national public health emergency. ([place])

## 2019-10-02 ENCOUNTER — Other Ambulatory Visit: Payer: Self-pay | Admitting: Internal Medicine

## 2019-10-02 ENCOUNTER — Other Ambulatory Visit: Payer: Self-pay | Admitting: Psychiatry

## 2019-10-02 DIAGNOSIS — Z1231 Encounter for screening mammogram for malignant neoplasm of breast: Secondary | ICD-10-CM

## 2019-10-10 ENCOUNTER — Ambulatory Visit: Payer: Medicare Other | Admitting: Student in an Organized Health Care Education/Training Program

## 2019-10-18 ENCOUNTER — Telehealth: Payer: Self-pay

## 2019-10-18 ENCOUNTER — Encounter: Payer: Self-pay | Admitting: Student in an Organized Health Care Education/Training Program

## 2019-10-18 NOTE — Telephone Encounter (Signed)
Attempted to call patient about Mondays Visit, No answer. Left message for patient to return call.

## 2019-10-22 ENCOUNTER — Other Ambulatory Visit: Payer: Self-pay

## 2019-10-22 ENCOUNTER — Encounter: Payer: Self-pay | Admitting: Student in an Organized Health Care Education/Training Program

## 2019-10-22 ENCOUNTER — Ambulatory Visit
Payer: Medicare Other | Attending: Student in an Organized Health Care Education/Training Program | Admitting: Student in an Organized Health Care Education/Training Program

## 2019-10-22 DIAGNOSIS — G894 Chronic pain syndrome: Secondary | ICD-10-CM

## 2019-10-22 DIAGNOSIS — M1712 Unilateral primary osteoarthritis, left knee: Secondary | ICD-10-CM

## 2019-10-22 DIAGNOSIS — M1711 Unilateral primary osteoarthritis, right knee: Secondary | ICD-10-CM | POA: Diagnosis not present

## 2019-10-22 NOTE — Progress Notes (Signed)
Pain Management Virtual Encounter Note - Virtual Visit via [place] (real-time audio visits between healthcare provider and patient).   Patient's Phone No. & Preferred Pharmacy:  [phone] (home); [phone] (mobile); (Preferred) [phone] [email or web address]  [id number] - [place], [place] - [address] [address] Phone: [phone] Fax: [phone]    Pre-screening note:  Our staff contacted Brittney [name] and offered her an "in person", "face-to-face" appointment versus a telephone encounter. She indicated preferring the telephone encounter, at this time.   Reason for Virtual Visit: COVID-19*  Social distancing based on CDC and AMA recommendations.   I contacted Brittney [name] on 10/22/2019 via video conference.      I clearly identified myself as [name], MD. I verified that I was speaking with the correct person using two identifiers (Name: Brittney [name], and date of birth: 14-Feb-1951).  Advanced Informed Consent I sought verbal advanced consent from Brittney [name] for virtual visit interactions. I informed Brittney [name] of possible security and privacy concerns, risks, and limitations associated with providing "not-in-person" medical evaluation and management services. I also informed Brittney [name] of the availability of "in-person" appointments. Finally, I informed her that there would be a charge for the virtual visit and that she could be  personally, fully or partially, financially responsible for it. Brittney [name] expressed understanding and agreed to proceed.   Historic Elements   Brittney [name] is a 68 y.o. year old, female patient evaluated today after her last encounter by our practice on 10/18/2019. Brittney [name]  has a past medical history of Abdominal pain, left upper quadrant (12/11/2012), Arthritis, Asthma, Broken leg (2014), Cancer ([place]) (2007), Chronic kidney disease, stage 2 (mild), Chronic lower back pain,  Collagen vascular disease ([place]), Coronary artery disease, Diabetes mellitus without complication ([place]), Diabetic nephropathy associated with secondary diabetes mellitus ([place]), Flushing (12/11/2012), Gastroparesis, Gross hematuria (12/11/2012), Hepatic cirrhosis ([place]), Hyperlipidemia, Hypertension, Hypothyroidism, Hypothyroidism, IBS (irritable bowel syndrome), Kidney stone (12/11/2012), Lower extremity edema, Morbid obesity with BMI of 40.0-44.9, adult ([place]), Myocardial infarction ([place]) (09/2013), Nausea without vomiting (12/11/2012), Nephrolithiasis, Nephrolithiasis (04/26/2014), Nonproliferative retinopathy due to secondary diabetes ([place]), Numbness and tingling of right leg, Peripheral vascular disease ([place]), Renal colic (123456), Renal insufficiency, Sciatica, Sciatica (12/11/2012), Skin cancer (08/2018), Sleep apnea, and Unilateral small kidney without contralateral hypertrophy. She also  has a past surgical history that includes Cesarean section (1980); Dilation and curettage of uterus; Eye surgery (1986); Abdominal hysterectomy; Tonsillectomy; Esophagogastroduodenoscopy (02/08/2014); Colonoscopy (05/04/2001); Esophagogastroduodenoscopy (egd) with propofol (N/A, 09/20/2016); Colonoscopy with propofol (N/A, 09/20/2016); Mohs surgery (11/2016); Cholecystectomy; Hernia repair; Joint replacement (Left, 09/24/2013); Back surgery (04/1999); Pulse generator implant (N/A, 11/02/2017); and Esophagogastroduodenoscopy (egd) with propofol (N/A, 05/23/2018). Brittney [name] has a current medication list which includes the following prescription(s): albuterol, alprazolam, butalbital-acetaminophen-caffeine, candesartan, carvedilol, clonidine, cyclobenzaprine, diclofenac sodium, doxazosin, escitalopram, fluticasone, fluticasone, furosemide, gabapentin, hydralazine, ibuprofen, insulin detemir, insulin regular human concentrated, levothyroxine, linzess, metoclopramide, nitroglycerin, ondansetron, oxycodone-acetaminophen,  oxycodone-acetaminophen, oxycodone-acetaminophen, pantoprazole, potassium chloride, potassium chloride, pramipexole, tramadol, trazodone, zolpidem, bumetanide, and ciprofloxacin. She  reports that she has never smoked. She has never used smokeless tobacco. She reports that she does not drink alcohol or use drugs. Brittney [name] is allergic to eggs or egg-derived products; shellfish allergy; amlodipine; codeine; imdur [isosorbide dinitrate]; lyrica [pregabalin]; monosodium glutamate; and tape.   HPI  Today, she is being contacted for a post-procedure assessment.   Evaluation of last interventional procedure  09/06/2019 Procedure:   Type: Therapeutic Superior-lateral, Superior-medial, and Inferior-medial, Genicular  Nerve Radiofrequency Ablation.  #1  Region: Lateral, Anterior, and Medial aspects of the knee joint, above and below the knee joint proper. Level: Superior and inferior to the knee joint. Laterality: Right   Sedation: Please see nurses note for DOS. When no sedatives are used, the analgesic levels obtained are directly associated to the effectiveness of the local anesthetics. However, when sedation is provided, the level of analgesia obtained during the initial 1 hour following the intervention, is believed to be the result of a combination of factors. These factors may include, but are not limited to: 1. The effectiveness of the local anesthetics used. 2. The effects of the analgesic(s) and/or anxiolytic(s) used. 3. The degree of discomfort experienced by the patient at the time of the procedure. 4. The patients ability and reliability in recalling and recording the events. 5. The presence and influence of possible secondary gains and/or psychosocial factors. Reported result: Relief experienced during the 1st hour after the procedure: 100%   (Ultra-Short Term Relief)            Interpretative annotation: Clinically appropriate result. Analgesia during this period is likely to be Local  Anesthetic and/or IV Sedative (Analgesic/Anxiolytic) related.          Effects of local anesthetic: The analgesic effects attained during this period are directly associated to the localized infiltration of local anesthetics and therefore cary significant diagnostic value as to the etiological location, or anatomical origin, of the pain. Expected duration of relief is directly dependent on the pharmacodynamics of the local anesthetic used. Long-acting (4-6 hours) anesthetics used.  Reported result: Relief during the next 4 to 6 hour after the procedure: 100%   (Short-Term Relief)            Interpretative annotation: Clinically appropriate result. Analgesia during this period is likely to be Local Anesthetic-related.          Long-term benefit: Defined as the period of time past the expected duration of local anesthetics (1 hour for short-acting and 4-6 hours for long-acting). With the possible exception of prolonged sympathetic blockade from the local anesthetics, benefits during this period are typically attributed to, or associated with, other factors such as analgesic sensory neuropraxia, antiinflammatory effects, or beneficial biochemical changes provided by agents other than the local anesthetics.  Reported result: Extended relief following procedure:50%   (Long-Term Relief)            Interpretative annotation: Clinically appropriate result. Good relief. Therapeutic success.    Benefit could signal adequate RF ablation.  UDS:  Summary  Date Value Ref Range Status  01/03/2019 FINAL  Final    Comment:    ==================================================================== TOXASSURE SELECT 13 (MW) ==================================================================== Test                             Result       Flag       Units Drug Present and Declared for Prescription Verification   Alprazolam                     165          EXPECTED   ng/mg creat   Alpha-hydroxyalprazolam        412           EXPECTED   ng/mg creat    Source of alprazolam is a scheduled prescription medication.    Alpha-hydroxyalprazolam is an expected metabolite of alprazolam.   Oxycodone  812          EXPECTED   ng/mg creat   Oxymorphone                    932          EXPECTED   ng/mg creat   Noroxycodone                   1294         EXPECTED   ng/mg creat   Noroxymorphone                 332          EXPECTED   ng/mg creat    Sources of oxycodone are scheduled prescription medications.    Oxymorphone, noroxycodone, and noroxymorphone are expected    metabolites of oxycodone. Oxymorphone is also available as a    scheduled prescription medication.   Tramadol                       2565         EXPECTED   ng/mg creat   O-Desmethyltramadol            7935         EXPECTED   ng/mg creat   N-Desmethyltramadol            2141         EXPECTED   ng/mg creat    Source of tramadol is a prescription medication.    O-desmethyltramadol and N-desmethyltramadol are expected    metabolites of tramadol.   Butalbital                     PRESENT      EXPECTED ==================================================================== Test                      Result    Flag   Units      Ref Range   Creatinine              34               mg/dL      >=20 ==================================================================== Declared Medications:  The flagging and interpretation on this report are based on the  following declared medications.  Unexpected results may arise from  inaccuracies in the declared medications.  **Note: The testing scope of this panel includes these medications:  Alprazolam  Butalbital (Butalbital/APAP/Caffeine)  Oxycodone (Percocet)  Tramadol (Ultram)  **Note: The testing scope of this panel does not include following  reported medications:  Acetaminophen (Butalbital/APAP/Caffeine)  Acetaminophen (Percocet)  Albuterol  Bumetanide  Caffeine (Butalbital/APAP/Caffeine)   Candesartan  Carvedilol  Clonidine  Cyclobenzaprine (Flexeril)  Doxazosin (Cardura)  Escitalopram (Lexapro)  Fluticasone (Flonase)  Fluticasone (Flovent)  Gabapentin (Neurontin)  Hydralazine (Apresoline)  Ibuprofen (Advil)  Insulin (Humulin)  Insulin (Levemir)  Lactulose  Levothyroxine  Lubiprostone (Amitiza)  Metoclopramide (Reglan)  Pantoprazole (Protonix)  Potassium  Pramipexole (Mirapex)  Sucralfate (Carafate)  Topical Diclofenac (Voltaren)  Trazodone (Desyrel)  Zolpidem (Ambien) ==================================================================== For clinical consultation, please call [phone]. ====================================================================    Laboratory Chemistry Profile (12 mo)  Renal: 08/01/2019: BUN 10; Creatinine, Ser 0.64  Lab Results  Component Value Date   GFRAA >60 08/01/2019   GFRNONAA >60 08/01/2019   Hepatic: 07/30/2019: Albumin 3.3 Lab Results  Component Value Date   AST 39 07/30/2019   ALT 34 07/30/2019   Other: No  results found for requested labs within last 8760 hours. Note: Above Lab results reviewed.  Imaging  Last 90 days:  Ct Head Wo Contrast  Result Date: 07/30/2019 CLINICAL DATA:  Fall EXAM: CT HEAD WITHOUT CONTRAST TECHNIQUE: Contiguous axial images were obtained from the base of the skull through the vertex without intravenous contrast. COMPARISON:  10/22/2014 FINDINGS: Brain: There is no mass, hemorrhage or extra-axial collection. The size and configuration of the ventricles and extra-axial CSF spaces are normal. There is hypoattenuation of the white matter, most commonly indicating chronic small vessel disease. Vascular: No abnormal hyperdensity of the major intracranial arteries or dural venous sinuses. No intracranial atherosclerosis. Skull: The visualized skull base, calvarium and extracranial soft tissues are normal. Sinuses/Orbits: No fluid levels or advanced mucosal thickening of the visualized paranasal  sinuses. No mastoid or middle ear effusion. The orbits are normal. IMPRESSION: Chronic small vessel ischemia without acute intracranial abnormality. Electronically Signed   By: Ulyses Jarred M.D.   On: 07/30/2019 19:57   US Venous Img Lower Unilateral Right  Result Date: 07/31/2019 CLINICAL DATA:  Diffuse right leg pain for several months EXAM: RIGHT LOWER EXTREMITY VENOUS DOPPLER ULTRASOUND TECHNIQUE: Gray-scale sonography with graded compression, as well as color Doppler and duplex ultrasound were performed to evaluate the lower extremity deep venous systems from the level of the common femoral vein and including the common femoral, femoral, profunda femoral, popliteal and calf veins including the posterior tibial, peroneal and gastrocnemius veins when visible. The superficial great saphenous vein was also interrogated. Spectral Doppler was utilized to evaluate flow at rest and with distal augmentation maneuvers in the common femoral, femoral and popliteal veins. COMPARISON:  None. FINDINGS: Contralateral Common Femoral Vein: Respiratory phasicity is normal and symmetric with the symptomatic side. No evidence of thrombus. Normal compressibility. Common Femoral Vein: No evidence of thrombus. Saphenofemoral Junction: No evidence of thrombus. Profunda Femoral Vein: No evidence of thrombus. Femoral Vein: No evidence of thrombus. Popliteal Vein: No evidence of thrombus. Calf Veins: No evidence of thrombus. IMPRESSION: No evidence of right lower extremity deep venous thrombosis. Electronically Signed   By: Monte Fantasia M.D.   On: 07/31/2019 04:19   Dg Chest Port 1 View  Result Date: 07/30/2019 CLINICAL DATA:  Patient fell last night.  Instability. EXAM: PORTABLE CHEST 1 VIEW COMPARISON:  Radiographs 10/22/2014.  CT 05/25/2017. FINDINGS: 1544 hours. Suboptimal inspiration. The heart size and mediastinal contours are normal. The lungs are clear. There is no pleural effusion or pneumothorax. No acute osseous  findings are identified. Previous lower cervical fusion noted. IMPRESSION: No evidence of active cardiopulmonary process. Electronically Signed   By: Richardean Sale M.D.   On: 07/30/2019 16:09   Dg Knee Complete 4 Views Right  Result Date: 07/30/2019 CLINICAL DATA:  Right knee pain.  Status post fall. EXAM: RIGHT KNEE - COMPLETE 4+ VIEW COMPARISON:  X-ray of the right tibia and fibula October 22, 2014 FINDINGS: There is no acute fracture or dislocation. Degenerative joint changes with narrow femoral tibial joint space and osteophyte formation are noted. The soft tissues are normal. IMPRESSION: No acute fracture or dislocation noted. Degenerative joint changes of right knee. Electronically Signed   By: Abelardo Diesel M.D.   On: 07/30/2019 16:11   Dg Pain Clinic C-arm 1-60 Min No Report  Result Date: 08/29/2019 Fluoro was used, but no Radiologist interpretation will be provided. Please refer to "NOTES" tab for provider progress note.   Assessment  The primary encounter diagnosis was Primary osteoarthritis of left  knee. Diagnoses of Primary osteoarthritis of right knee and Chronic pain syndrome were also pertinent to this visit.  Plan of Care  I am having Brittney [name] "Pam" maintain her ALPRAZolam, diclofenac sodium, doxazosin, escitalopram, hydrALAZINE, metoCLOPramide, pantoprazole, traZODone, zolpidem, insulin regular human CONCENTRATED, albuterol, butalbital-acetaminophen-caffeine, fluticasone, Insulin Detemir, pramipexole, bumetanide, carvedilol, cloNIDine, levothyroxine, cyclobenzaprine, traMADol, ibuprofen, candesartan, Linzess, ondansetron, gabapentin, potassium chloride, fluticasone, ciprofloxacin, furosemide, nitroGLYCERIN, potassium chloride, oxyCODONE-acetaminophen, oxyCODONE-acetaminophen, and oxyCODONE-acetaminophen.  1. Primary osteoarthritis of left knee -History of left knee replacement in 2014.  Is endorsing severe knee pain with weightbearing.  Discussed diagnostic genicular nerve  block and possible RFA.  Risks and benefits reviewed and patient like to proceed. - GENICULAR NERVE BLOCK; Future  2. Primary osteoarthritis of right knee -Status post right knee genicular RFA on 09/06/2019.  This procedure was helpful for her right knee pain.  Unfortunately the patient did sustain a fall which resulted in her being admitted to the hospital for 3 days.  This set her back in regards to her knee pain.  Today she is endorsing approximately 50% pain relief in regards to her right knee pain after her radiofrequency ablation.  Can repeat in 6 months if need be.  3. Chronic pain syndrome -Status post spinal cord stimulator implant: Nevro for failed back surgical syndrome, postlaminectomy pain syndrome, persistent radicular pain.  Continue with neuromodulation. -Continue medication management.  Patient has an appointment with me end of November for med management.   Orders:  Orders Placed This Encounter  Procedures  . GENICULAR NERVE BLOCK    Indication(s):  Sub-acute knee pain    Standing Status:   Future    Standing Expiration Date:   11/21/2019    Scheduling Instructions:     Side: LEFT     Sedation: With Sedation.     Timeframe: As soon as schedule allows    Order Specific Question:   Where will this procedure be performed?    Answer:   [facility]   Follow-up plan:   Return for Procedure LEFT knee genicular NB , without sedation.     s/p GNB (R) 07/18/19-helpful, provided greater than 50% pain relief for 5 days however patient did sustain a fall due to UTI associated sepsis did spend 3 days in the hospital.  Prior to her fall she was obtaining significant pain relief with genicular nerve block.  Status post right genicular RFA on 08/29/2019- helpful.  No steroid used. Endorsing 50% pain relief for right knee after RFA, plan for LEFT GNB +/- RFA (hx of Left knee replacement)       Recent Visits Date Type Provider Dept  09/06/19 Office Visit [name], MD [facility]  08/29/19 Procedure visit [name], MD [facility]  08/15/19 Office Visit [name], MD [facility]  Showing recent visits within past 90 days and meeting all other requirements   Today's Visits Date Type Provider Dept  10/22/19 Office Visit [name], MD [facility]  Showing today's visits and meeting all other requirements   Future Appointments Date Type Provider Dept  11/26/19 Appointment [name], MD [facility]  Showing future appointments within next 90 days and meeting all other requirements   I discussed the assessment and treatment plan with the patient. The patient was provided an opportunity to ask questions and all were answered. The patient agreed with the plan and demonstrated an understanding of the instructions.  Patient advised to call back or seek an  in-person evaluation if the symptoms or condition worsens.  Total duration of non-face-to-face encounter:25 minutes.  Note by: [name], MD Date: 10/22/2019; Time: 2:21 PM  Note: This dictation was prepared with Dragon dictation. Any transcriptional errors that may result from this process are unintentional.  Disclaimer:  * Given the special circumstances of the COVID-19 pandemic, the federal government has announced that the Office for Civil Rights (OCR) will exercise its enforcement discretion and will not impose penalties on physicians using telehealth in the event of noncompliance with regulatory requirements under the [place] (HIPAA) in connection with the good faith provision of telehealth during the XX123456 national public health emergency. ([place])

## 2019-10-31 ENCOUNTER — Ambulatory Visit
Admission: RE | Admit: 2019-10-31 | Discharge: 2019-10-31 | Disposition: A | Discharge status: Home / Self Care | Payer: Medicare Other | Source: Ambulatory Visit | Attending: Student in an Organized Health Care Education/Training Program | Admitting: Student in an Organized Health Care Education/Training Program

## 2019-10-31 ENCOUNTER — Ambulatory Visit (HOSPITAL_BASED_OUTPATIENT_CLINIC_OR_DEPARTMENT_OTHER): Payer: Medicare Other | Admitting: Student in an Organized Health Care Education/Training Program

## 2019-10-31 ENCOUNTER — Other Ambulatory Visit: Payer: Self-pay

## 2019-10-31 ENCOUNTER — Encounter: Payer: Self-pay | Admitting: Student in an Organized Health Care Education/Training Program

## 2019-10-31 VITALS — BP 174/91 | HR 64 | Temp 98.3°F | Resp 16

## 2019-10-31 DIAGNOSIS — M1712 Unilateral primary osteoarthritis, left knee: Secondary | ICD-10-CM | POA: Diagnosis not present

## 2019-10-31 MED ORDER — ROPIVACAINE HCL 2 MG/ML IJ SOLN
INTRAMUSCULAR | Status: AC
  Filled 2019-10-31: qty 10

## 2019-10-31 MED ORDER — LIDOCAINE HCL 2 % IJ SOLN
INTRAMUSCULAR | Status: AC
  Filled 2019-10-31: qty 20

## 2019-10-31 MED ORDER — DEXAMETHASONE SODIUM PHOSPHATE 10 MG/ML IJ SOLN
INTRAMUSCULAR | Status: AC
  Filled 2019-10-31: qty 1

## 2019-10-31 MED ORDER — FENTANYL CITRATE (PF) 100 MCG/2ML IJ SOLN
INTRAMUSCULAR | Status: AC
  Filled 2019-10-31: qty 2

## 2019-10-31 MED ORDER — LIDOCAINE HCL 2 % IJ SOLN: 20.0000 mL | Freq: Once | INTRAMUSCULAR | Status: DC

## 2019-10-31 MED ORDER — DEXAMETHASONE SODIUM PHOSPHATE 10 MG/ML IJ SOLN
10.0000 mg | Freq: Once | INTRAMUSCULAR | Status: AC
  Administered 2019-10-31: 10 mg
  Filled 2019-10-31: qty 1

## 2019-10-31 MED ORDER — ROPIVACAINE HCL 2 MG/ML IJ SOLN
9.0000 mL | Freq: Once | INTRAMUSCULAR | Status: AC
  Administered 2019-10-31: 11:00:00 9 mL

## 2019-10-31 MED ORDER — LIDOCAINE HCL 2 % IJ SOLN
20.0000 mL | Freq: Once | INTRAMUSCULAR | Status: AC
  Administered 2019-10-31: 400 mg
  Filled 2019-10-31: qty 20

## 2019-10-31 MED ORDER — ROPIVACAINE HCL 2 MG/ML IJ SOLN
2.0000 mL | Freq: Once | INTRAMUSCULAR | Status: AC
  Administered 2019-10-31: 2 mL via EPIDURAL
  Filled 2019-10-31: qty 10

## 2019-10-31 NOTE — Progress Notes (Signed)
Safety precautions to be maintained throughout the outpatient stay will include: orient to surroundings, keep bed in low position, maintain call bell within reach at all times, provide assistance with transfer out of bed and ambulation.  

## 2019-10-31 NOTE — Progress Notes (Signed)
Patient's Name: Brittney [name]  MRN: [id number]  Referring Provider: Rusty Aus, MD  DOB: 1951-11-20  PCP: Rusty Aus, MD  DOS: 10/31/2019  Note by: [name], MD  Service setting: Ambulatory outpatient  Specialty: Interventional Pain Management  Patient type: Established  Location: [facility] (AMB) Pain Management Facility  Visit type: Interventional Procedure   Primary Reason for Visit: Interventional Pain Management Treatment. CC: Knee Pain (left)  Procedure:          Anesthesia, Analgesia, Anxiolysis:  Type: Genicular Nerves Block (Superior-lateral, Superior-medial, and Inferior-medial Genicular Nerves) #1  (LOCAL ONLY) CPT: [id number]      Primary Purpose: Diagnostic Region: Lateral, Anterior, and Medial aspects of the knee joint, above and below the knee joint proper. Level: Superior and inferior to the knee joint. Target Area: For Genicular Nerve block(s), the targets are: the superior-lateral genicular nerve, located in the lateral distal portion of the femoral shaft as it curves to form the lateral epicondyle, in the region of the distal femoral metaphysis; the superior-medial genicular nerve, located in the medial distal portion of the femoral shaft as it curves to form the medial epicondyle; and the inferior-medial genicular nerve, located in the medial, proximal portion of the tibial shaft, as it curves to form the medial epicondyle, in the region of the proximal tibial metaphysis. Approach: Anterior, percutaneous, ipsilateral approach. Laterality: Left knee  Type: Local Anesthesia  Local Anesthetic: Lidocaine 1-2%  Position: Modified Fowler's position with pillows under the targeted knee(s).   Indications: 1. Primary osteoarthritis of left knee    Pain Score: Pre-procedure: 8 /10 Post-procedure: 6 /10   Pre-op Assessment:  Brittney [name] is a 68 y.o. (year old), female patient, seen today for interventional treatment. She  has a past surgical history that includes Cesarean  section (1980); Dilation and curettage of uterus; Eye surgery (1986); Abdominal hysterectomy; Tonsillectomy; Esophagogastroduodenoscopy (02/08/2014); Colonoscopy (05/04/2001); Esophagogastroduodenoscopy (egd) with propofol (N/A, 09/20/2016); Colonoscopy with propofol (N/A, 09/20/2016); Mohs surgery (11/2016); Cholecystectomy; Hernia repair; Joint replacement (Left, 09/24/2013); Back surgery (04/1999); Pulse generator implant (N/A, 11/02/2017); and Esophagogastroduodenoscopy (egd) with propofol (N/A, 05/23/2018). [name] has a current medication list which includes the following prescription(s): albuterol, alprazolam, bumetanide, butalbital-acetaminophen-caffeine, candesartan, carvedilol, ciprofloxacin, clonidine, cyclobenzaprine, diclofenac sodium, doxazosin, escitalopram, fluticasone, fluticasone, furosemide, gabapentin, hydralazine, ibuprofen, insulin detemir, insulin regular human concentrated, levothyroxine, linzess, metoclopramide, nitroglycerin, ondansetron, oxycodone-acetaminophen, oxycodone-acetaminophen, pantoprazole, potassium chloride, potassium chloride, pramipexole, tramadol, trazodone, and zolpidem. Her primarily concern today is the Knee Pain (left)  Initial Vital Signs:  Pulse/HCG Rate: 62  Temp: 98.3 F (36.8 C) Resp: 18 BP: (!) 151/53 SpO2: 95 %  BMI: Estimated body mass index is 50.68 kg/m as calculated from the following:   Height as of 08/29/19: 5\' 6"  (1.676 m).   Weight as of 08/29/19: 314 lb (142.4 kg).  Risk Assessment: Allergies: Reviewed. She is allergic to eggs or egg-derived products; shellfish allergy; amlodipine; codeine; imdur [isosorbide dinitrate]; lyrica [pregabalin]; monosodium glutamate; and tape.  Allergy Precautions: None required Coagulopathies: Reviewed. None identified.  Blood-thinner therapy: None at this time Active Infection(s): Reviewed. None identified. Ms. Morici is afebrile  Site Confirmation: Ms. Leiman was asked to confirm the procedure and  laterality before marking the site Procedure checklist: Completed Consent: Before the procedure and under the influence of no sedative(s), amnesic(s), or anxiolytics, the patient was informed of the treatment options, risks and possible complications. To fulfill our ethical and legal obligations, as recommended by the American Medical Association's Code of Ethics, I have informed the patient  of my clinical impression; the nature and purpose of the treatment or procedure; the risks, benefits, and possible complications of the intervention; the alternatives, including doing nothing; the risk(s) and benefit(s) of the alternative treatment(s) or procedure(s); and the risk(s) and benefit(s) of doing nothing. The patient was provided information about the general risks and possible complications associated with the procedure. These may include, but are not limited to: failure to achieve desired goals, infection, bleeding, organ or nerve damage, allergic reactions, paralysis, and death. In addition, the patient was informed of those risks and complications associated to the procedure, such as failure to decrease pain; infection; bleeding; organ or nerve damage with subsequent damage to sensory, motor, and/or autonomic systems, resulting in permanent pain, numbness, and/or weakness of one or several areas of the body; allergic reactions; (i.e.: anaphylactic reaction); and/or death. Furthermore, the patient was informed of those risks and complications associated with the medications. These include, but are not limited to: allergic reactions (i.e.: anaphylactic or anaphylactoid reaction(s)); adrenal axis suppression; blood sugar elevation that in diabetics may result in ketoacidosis or comma; water retention that in patients with history of congestive heart failure may result in shortness of breath, pulmonary edema, and decompensation with resultant heart failure; weight gain; swelling or edema; medication-induced  neural toxicity; particulate matter embolism and blood vessel occlusion with resultant organ, and/or nervous system infarction; and/or aseptic necrosis of one or more joints. Finally, the patient was informed that Medicine is not an exact science; therefore, there is also the possibility of unforeseen or unpredictable risks and/or possible complications that may result in a catastrophic outcome. The patient indicated having understood very clearly. We have given the patient no guarantees and we have made no promises. Enough time was given to the patient to ask questions, all of which were answered to the patient's satisfaction. Ms. Foo has indicated that she wanted to continue with the procedure. Attestation: I, the ordering provider, attest that I have discussed with the patient the benefits, risks, side-effects, alternatives, likelihood of achieving goals, and potential problems during recovery for the procedure that I have provided informed consent. Date   Time: 10/31/2019 11:03 AM  Pre-Procedure Preparation:  Monitoring: As per clinic protocol. Respiration, ETCO2, SpO2, BP, heart rate and rhythm monitor placed and checked for adequate function Safety Precautions: Patient was assessed for positional comfort and pressure points before starting the procedure. Time-out: I initiated and conducted the "Time-out" before starting the procedure, as per protocol. The patient was asked to participate by confirming the accuracy of the "Time Out" information. Verification of the correct person, site, and procedure were performed and confirmed by me, the nursing staff, and the patient. "Time-out" conducted as per Joint Commission's Universal Protocol (UP.01.01.01). Time: 1125  Description of Procedure:          Area Prepped: Entire knee area, from mid-thigh to mid-shin, lateral, anterior, and medial aspects. Prepping solution: DuraPrep (Iodine Povacrylex [0.7% available iodine] and Isopropyl Alcohol, 74%  w/w) Safety Precautions: Aspiration looking for blood return was conducted prior to all injections. At no point did we inject any substances, as a needle was being advanced. No attempts were made at seeking any paresthesias. Safe injection practices and needle disposal techniques used. Medications properly checked for expiration dates. SDV (single dose vial) medications used. Description of the Procedure: Protocol guidelines were followed. The patient was placed in position over the procedure table. The target area was identified and the area prepped in the usual manner. Skin & deeper tissues infiltrated  with local anesthetic. Appropriate amount of time allowed to pass for local anesthetics to take effect. The procedure needles were then advanced to the target area. Proper needle placement secured. Negative aspiration confirmed. Solution injected in intermittent fashion, asking for systemic symptoms every 0.5cc of injectate. The needles were then removed and the area cleansed, making sure to leave some of the prepping solution back to take advantage of its long term bactericidal properties.  Vitals:   10/31/19 1105 10/31/19 1125 10/31/19 1135  BP: (!) 151/53 (!) 172/89 (!) 174/91  Pulse: 62 66 64  Resp: 18 15 16   Temp: 98.3 F (36.8 C)    TempSrc: Oral    SpO2: 95% 95% 96%    Start Time: 1125 hrs. End Time: 1131 hrs. Materials:  Needle(s) Type: Spinal Needle Gauge: 25G Length: 3.5-in Medication(s): Please see orders for medications and dosing details. 2 cc of 0.2% Ropivacaine  injected at each level above for the left knee  Imaging Guidance (Non-Spinal):          Type of Imaging Technique: Fluoroscopy Guidance (Non-Spinal) Indication(s): Assistance in needle guidance and placement for procedures requiring needle placement in or near specific anatomical locations not easily accessible without such assistance. Exposure Time: Please see nurses notes. Contrast: None used. Fluoroscopic  Guidance: I was personally present during the use of fluoroscopy. "Tunnel Vision Technique" used to obtain the best possible view of the target area. Parallax error corrected before commencing the procedure. "Direction-depth-direction" technique used to introduce the needle under continuous pulsed fluoroscopy. Once target was reached, antero-posterior, oblique, and lateral fluoroscopic projection used confirm needle placement in all planes. Images permanently stored in EMR. Interpretation: No contrast injected. I personally interpreted the imaging intraoperatively. Adequate needle placement confirmed in multiple planes. Permanent images saved into the patient's record.  Antibiotic Prophylaxis:   Anti-infectives (From admission, onward)   None     Indication(s): None identified  Post-operative Assessment:  Post-procedure Vital Signs:  Pulse/HCG Rate: 64  Temp: 98.3 F (36.8 C) Resp: 16 BP: (!) 174/91 SpO2: 96 %  EBL: None  Complications: No immediate post-treatment complications observed by team, or reported by patient.  Note: The patient tolerated the entire procedure well. A repeat set of vitals were taken after the procedure and the patient was kept under observation following institutional policy, for this type of procedure. Post-procedural neurological assessment was performed, showing return to baseline, prior to discharge. The patient was provided with post-procedure discharge instructions, including a section on how to identify potential problems. Should any problems arise concerning this procedure, the patient was given instructions to immediately contact us, at any time, without hesitation. In any case, we plan to contact the patient by telephone for a follow-up status report regarding this interventional procedure.  Comments:  No additional relevant information.  Plan of Care  Orders:  Orders Placed This Encounter  Procedures   DG PAIN CLINIC C-ARM 1-60 MIN NO REPORT     Intraoperative interpretation by procedural physician at [place].    Standing Status:   Standing    Number of Occurrences:   1    Order Specific Question:   Reason for exam:    Answer:   Assistance in needle guidance and placement for procedures requiring needle placement in or near specific anatomical locations not easily accessible without such assistance.   Medications ordered for procedure: Meds ordered this encounter  Medications   lidocaine (XYLOCAINE) 2 % (with pres) injection 400 mg   ropivacaine (PF) 2  mg/mL (0.2%) (NAROPIN) injection 2 mL   dexamethasone (DECADRON) injection 10 mg   DISCONTD: lidocaine (XYLOCAINE) 2 % (with pres) injection 400 mg   ropivacaine (PF) 2 mg/mL (0.2%) (NAROPIN) injection 9 mL   Medications administered: We administered lidocaine, ropivacaine (PF) 2 mg/mL (0.2%), dexamethasone, and ropivacaine (PF) 2 mg/mL (0.2%).  See the medical record for exact dosing, route, and time of administration.  Follow-up plan:   Return for Keep sch. appt.      s/p GNB (R) 07/18/19, RFA on 08/29/2019; L GNB on 11/4/020 (LOCAL ONLY per pt request)    Recent Visits Date Type Provider Dept  10/22/19 Office Visit [name], MD [facility]  09/06/19 Office Visit [name], MD [facility]  08/29/19 Procedure visit [name], MD [facility]  08/15/19 Office Visit [name], MD [facility]  Showing recent visits within past 90 days and meeting all other requirements   Today's Visits Date Type Provider Dept  10/31/19 Procedure visit [name], MD [facility]  Showing today's visits and meeting all other requirements   Future Appointments Date Type Provider Dept  11/26/19 Appointment [name], MD [facility]  Showing future appointments within next 90 days and meeting all other requirements   Disposition: Discharge home  Discharge Date & Time: 10/31/2019; 1137 hrs.    Primary Care Physician: Rusty Aus, MD Location: [facility] Note by: [name], MD Date: 10/31/2019; Time: 11:39 AM  Disclaimer:  Medicine is not an exact science. The only guarantee in medicine is that nothing is guaranteed. It is important to note that the decision to proceed with this intervention was based on the information collected from the patient. The Data and conclusions were drawn from the patient's questionnaire, the interview, and the physical examination. Because the information was provided in large part by the patient, it cannot be guaranteed that it has not been purposely or unconsciously manipulated. Every effort has been made to obtain as much relevant data as possible for this evaluation. It is important to note that the conclusions that lead to this procedure are derived in large part from the available data. Always take into account that the treatment will also be dependent on availability of resources and existing treatment guidelines, considered by other Pain Management Practitioners as being common knowledge and practice, at the time of the intervention. For Medico-Legal purposes, it is also important to point out that variation in procedural techniques and pharmacological choices are the acceptable norm. The indications, contraindications, technique, and results of the above procedure should only be interpreted and judged by a Board-Certified Interventional Pain Specialist with extensive familiarity and expertise in the same exact procedure and technique.

## 2019-10-31 NOTE — Patient Instructions (Signed)

## 2019-11-01 ENCOUNTER — Telehealth: Payer: Self-pay | Admitting: *Deleted

## 2019-11-01 NOTE — Telephone Encounter (Signed)
No problems post procedure. 

## 2019-11-01 NOTE — Telephone Encounter (Signed)
Spoke with patient re; procedure on yesterday, states the knee still hurts.  Encouraged ice or heat today and hopefully with the steroids it will improve over the next couple of days.

## 2019-11-21 ENCOUNTER — Encounter: Payer: Self-pay | Admitting: Student in an Organized Health Care Education/Training Program

## 2019-11-26 ENCOUNTER — Telehealth: Payer: Self-pay | Admitting: *Deleted

## 2019-11-26 ENCOUNTER — Other Ambulatory Visit: Payer: Self-pay

## 2019-11-26 ENCOUNTER — Encounter: Payer: Self-pay | Admitting: Student in an Organized Health Care Education/Training Program

## 2019-11-26 ENCOUNTER — Ambulatory Visit
Payer: Medicare Other | Attending: Student in an Organized Health Care Education/Training Program | Admitting: Student in an Organized Health Care Education/Training Program

## 2019-11-26 DIAGNOSIS — M48061 Spinal stenosis, lumbar region without neurogenic claudication: Secondary | ICD-10-CM | POA: Diagnosis not present

## 2019-11-26 DIAGNOSIS — M1712 Unilateral primary osteoarthritis, left knee: Secondary | ICD-10-CM | POA: Diagnosis not present

## 2019-11-26 DIAGNOSIS — M47816 Spondylosis without myelopathy or radiculopathy, lumbar region: Secondary | ICD-10-CM

## 2019-11-26 DIAGNOSIS — M961 Postlaminectomy syndrome, not elsewhere classified: Secondary | ICD-10-CM | POA: Diagnosis not present

## 2019-11-26 DIAGNOSIS — Z9889 Other specified postprocedural states: Secondary | ICD-10-CM

## 2019-11-26 DIAGNOSIS — M797 Fibromyalgia: Secondary | ICD-10-CM

## 2019-11-26 DIAGNOSIS — M4804 Spinal stenosis, thoracic region: Secondary | ICD-10-CM

## 2019-11-26 DIAGNOSIS — M5416 Radiculopathy, lumbar region: Secondary | ICD-10-CM

## 2019-11-26 DIAGNOSIS — G894 Chronic pain syndrome: Secondary | ICD-10-CM

## 2019-11-26 DIAGNOSIS — M1711 Unilateral primary osteoarthritis, right knee: Secondary | ICD-10-CM

## 2019-11-26 MED ORDER — TIZANIDINE HCL 4 MG PO TABS: 4.0000 mg | ORAL_TABLET | Freq: Every day | ORAL | 2 refills | Status: DC | PRN

## 2019-11-26 NOTE — Progress Notes (Signed)
Pain Management Virtual Encounter Note - Virtual Visit via Peter (real-time audio visits between healthcare provider and patient).   Patient's Phone No. & Preferred Pharmacy:  [phone] (home); [phone] (mobile); (Preferred) [phone] [email or web address]  [id number] - [place], [place] - [address] [address] Phone: [phone] Fax: [phone]    Pre-screening note:  Our staff contacted Brittney [name] and offered her an "in person", "face-to-face" appointment versus a telephone encounter. She indicated preferring the telephone encounter, at this time.   Reason for Virtual Visit: COVID-19*  Social distancing based on CDC and AMA recommendations.   I contacted Brittney [name] on 11/26/2019 via video conference.      I clearly identified myself as [name], MD. I verified that I was speaking with the correct person using two identifiers (Name: Brittney [name], and date of birth: 1951/03/26).  Advanced Informed Consent I sought verbal advanced consent from Brittney [name] for virtual visit interactions. I informed Brittney [name] of possible security and privacy concerns, risks, and limitations associated with providing "not-in-person" medical evaluation and management services. I also informed Brittney [name] of the availability of "in-person" appointments. Finally, I informed her that there would be a charge for the virtual visit and that she could be  personally, fully or partially, financially responsible for it. Brittney [name] expressed understanding and agreed to proceed.   Historic Elements   Brittney [name] is a 68 y.o. year old, female patient evaluated today after her last encounter by our practice on 11/01/2019. Brittney [name]  has a past medical history of Abdominal pain, left upper quadrant (12/11/2012), Arthritis, Asthma, Broken leg (2014), Cancer ([place]) (2007), Chronic kidney disease, stage 2 (mild), Chronic lower back pain,  Collagen vascular disease ([place]), Coronary artery disease, Diabetes mellitus without complication ([place]), Diabetic nephropathy associated with secondary diabetes mellitus ([place]), Flushing (12/11/2012), Gastroparesis, Gross hematuria (12/11/2012), Hepatic cirrhosis ([place]), Hyperlipidemia, Hypertension, Hypothyroidism, Hypothyroidism, IBS (irritable bowel syndrome), Kidney stone (12/11/2012), Lower extremity edema, Morbid obesity with BMI of 40.0-44.9, adult ([place]), Myocardial infarction ([place]) (09/2013), Nausea without vomiting (12/11/2012), Nephrolithiasis, Nephrolithiasis (04/26/2014), Nonproliferative retinopathy due to secondary diabetes ([place]), Numbness and tingling of right leg, Peripheral vascular disease ([place]), Renal colic (123456), Renal insufficiency, Sciatica, Sciatica (12/11/2012), Skin cancer (08/2018), Sleep apnea, and Unilateral small kidney without contralateral hypertrophy. She also  has a past surgical history that includes Cesarean section (1980); Dilation and curettage of uterus; Eye surgery (1986); Abdominal hysterectomy; Tonsillectomy; Esophagogastroduodenoscopy (02/08/2014); Colonoscopy (05/04/2001); Esophagogastroduodenoscopy (egd) with propofol (N/A, 09/20/2016); Colonoscopy with propofol (N/A, 09/20/2016); Mohs surgery (11/2016); Cholecystectomy; Hernia repair; Joint replacement (Left, 09/24/2013); Back surgery (04/1999); Pulse generator implant (N/A, 11/02/2017); and Esophagogastroduodenoscopy (egd) with propofol (N/A, 05/23/2018). Brittney [name] has a current medication list which includes the following prescription(s): albuterol, alprazolam, bumetanide, butalbital-acetaminophen-caffeine, candesartan, carvedilol, ciprofloxacin, clonidine, cyclobenzaprine, diclofenac sodium, doxazosin, escitalopram, fluticasone, fluticasone, furosemide, gabapentin, hydralazine, ibuprofen, insulin detemir, insulin regular human concentrated, levothyroxine, linzess, metoclopramide, nitroglycerin, ondansetron,  oxycodone-acetaminophen, pantoprazole, potassium chloride, potassium chloride, pramipexole, tramadol, trazodone, zolpidem, and tizanidine. She  reports that she has never smoked. She has never used smokeless tobacco. She reports that she does not drink alcohol or use drugs. Brittney [name] is allergic to eggs or egg-derived products; shellfish allergy; amlodipine; codeine; imdur [isosorbide dinitrate]; lyrica [pregabalin]; monosodium glutamate; and tape.   HPI  Today, she is being contacted for a post-procedure assessment.  Evaluation of last interventional procedure  10/31/2019 Procedure: LEFT KNEE GNB w local only  Genicular Nerves Block (Superior-lateral,  Superior-medial, and Inferior-medial Genicular Nerves) #1  (LOCAL ONLY as patient is a diabetic)   Sedation: Please see nurses note for DOS. When no sedatives are used, the analgesic levels obtained are directly associated to the effectiveness of the local anesthetics. However, when sedation is provided, the level of analgesia obtained during the initial 1 hour following the intervention, is believed to be the result of a combination of factors. These factors may include, but are not limited to: 1. The effectiveness of the local anesthetics used. 2. The effects of the analgesic(s) and/or anxiolytic(s) used. 3. The degree of discomfort experienced by the patient at the time of the procedure. 4. The patients ability and reliability in recalling and recording the events. 5. The presence and influence of possible secondary gains and/or psychosocial factors. Reported result: Relief experienced during the 1st hour after the procedure: 100 % (Ultra-Short Term Relief)            Interpretative annotation: Clinically appropriate result. Analgesia during this period is likely to be Local Anesthetic and/or IV Sedative (Analgesic/Anxiolytic) related.          Effects of local anesthetic: The analgesic effects attained during this period are directly  associated to the localized infiltration of local anesthetics and therefore cary significant diagnostic value as to the etiological location, or anatomical origin, of the pain. Expected duration of relief is directly dependent on the pharmacodynamics of the local anesthetic used. Long-acting (4-6 hours) anesthetics used.  Reported result: Relief during the next 4 to 6 hour after the procedure: 100 % (Short-Term Relief)            Interpretative annotation: Clinically appropriate result. Analgesia during this period is likely to be Local Anesthetic-related.          Long-term benefit: Defined as the period of time past the expected duration of local anesthetics (1 hour for short-acting and 4-6 hours for long-acting). With the possible exception of prolonged sympathetic blockade from the local anesthetics, benefits during this period are typically attributed to, or associated with, other factors such as analgesic sensory neuropraxia, antiinflammatory effects, or beneficial biochemical changes provided by agents other than the local anesthetics.  Reported result: Extended relief following procedure: 50 %-60% relief for 3 days (Long-Term Relief)            Interpretative annotation: Clinically appropriate result. Partial relief. No permanent benefit expected. Inflammation plays a part in the etiology to the pain.          PLAN for LEFT genicular nerve RFA   UDS:  Summary  Date Value Ref Range Status  01/03/2019 FINAL  Final    Comment:    ==================================================================== TOXASSURE SELECT 13 (MW) ==================================================================== Test                             Result       Flag       Units Drug Present and Declared for Prescription Verification   Alprazolam                     165          EXPECTED   ng/mg creat   Alpha-hydroxyalprazolam        412          EXPECTED   ng/mg creat    Source of alprazolam is a scheduled  prescription medication.    Alpha-hydroxyalprazolam is an expected metabolite of alprazolam.  Oxycodone                      812          EXPECTED   ng/mg creat   Oxymorphone                    932          EXPECTED   ng/mg creat   Noroxycodone                   1294         EXPECTED   ng/mg creat   Noroxymorphone                 332          EXPECTED   ng/mg creat    Sources of oxycodone are scheduled prescription medications.    Oxymorphone, noroxycodone, and noroxymorphone are expected    metabolites of oxycodone. Oxymorphone is also available as a    scheduled prescription medication.   Tramadol                       2565         EXPECTED   ng/mg creat   O-Desmethyltramadol            7935         EXPECTED   ng/mg creat   N-Desmethyltramadol            2141         EXPECTED   ng/mg creat    Source of tramadol is a prescription medication.    O-desmethyltramadol and N-desmethyltramadol are expected    metabolites of tramadol.   Butalbital                     PRESENT      EXPECTED ==================================================================== Test                      Result    Flag   Units      Ref Range   Creatinine              34               mg/dL      >=20 ==================================================================== Declared Medications:  The flagging and interpretation on this report are based on the  following declared medications.  Unexpected results may arise from  inaccuracies in the declared medications.  **Note: The testing scope of this panel includes these medications:  Alprazolam  Butalbital (Butalbital/APAP/Caffeine)  Oxycodone (Percocet)  Tramadol (Ultram)  **Note: The testing scope of this panel does not include following  reported medications:  Acetaminophen (Butalbital/APAP/Caffeine)  Acetaminophen (Percocet)  Albuterol  Bumetanide  Caffeine (Butalbital/APAP/Caffeine)  Candesartan  Carvedilol  Clonidine  Cyclobenzaprine (Flexeril)   Doxazosin (Cardura)  Escitalopram (Lexapro)  Fluticasone (Flonase)  Fluticasone (Flovent)  Gabapentin (Neurontin)  Hydralazine (Apresoline)  Ibuprofen (Advil)  Insulin (Humulin)  Insulin (Levemir)  Lactulose  Levothyroxine  Lubiprostone (Amitiza)  Metoclopramide (Reglan)  Pantoprazole (Protonix)  Potassium  Pramipexole (Mirapex)  Sucralfate (Carafate)  Topical Diclofenac (Voltaren)  Trazodone (Desyrel)  Zolpidem (Ambien) ==================================================================== For clinical consultation, please call [phone]. ====================================================================    Laboratory Chemistry Profile (12 mo)  Renal: 08/01/2019: BUN 10; Creatinine, Ser 0.64  Lab Results  Component Value Date   GFRAA >60 08/01/2019   GFRNONAA >60 08/01/2019   Hepatic: 07/30/2019:  Albumin 3.3 Lab Results  Component Value Date   AST 39 07/30/2019   ALT 34 07/30/2019   Other: No results found for requested labs within last 8760 hours. Note: Above Lab results reviewed.  Imaging  DG PAIN CLINIC C-ARM 1-60 MIN NO REPORT Fluoro was used, but no Radiologist interpretation will be provided.  Please refer to "NOTES" tab for provider progress note.   Assessment  The primary encounter diagnosis was Primary osteoarthritis of left knee. Diagnoses of L3-4 severe lumbar facet hypertrophy and spinal stenosis, L3-4 severe lumbar spinal stenosis (12/31/2016 MRI), Failed back surgical syndrome, Spinal stenosis of thoracic region, Fibromyalgia, Primary osteoarthritis of right knee, Status post lumbar spine surgery for decompression of spinal cord, and Chronic pain syndrome were also pertinent to this visit.  Plan of Care  Problem-specific:  Primary osteoarthritis of left knee  Orders Placed This Encounter  Procedures  . RFA - Genicular (Schedule)    Standing Status:   Future    Standing Expiration Date:   05/25/2021    Scheduling Instructions:     Side(s):  LEFT     Level(s): Superior-Lateral, Superior-Medial, and Inferior-Medial Genicular Nerve(s)     Sedation: With Sedation     Scheduling Timeframe: As soon as pre-approved    Order Specific Question:   Where will this procedure be performed?    Answer:   [facility]     I am having Brittney [name] "Pam" start on tiZANidine. I am also having her maintain her ALPRAZolam, diclofenac sodium, doxazosin, escitalopram, hydrALAZINE, metoCLOPramide, pantoprazole, traZODone, zolpidem, insulin regular human CONCENTRATED, albuterol, butalbital-acetaminophen-caffeine, fluticasone, Insulin Detemir, pramipexole, bumetanide, carvedilol, cloNIDine, levothyroxine, cyclobenzaprine, traMADol, ibuprofen, candesartan, Linzess, ondansetron, gabapentin, potassium chloride, fluticasone, ciprofloxacin, furosemide, nitroGLYCERIN, potassium chloride, and oxyCODONE-acetaminophen.  Pharmacotherapy (Medications Ordered): Meds ordered this encounter  Medications  . tiZANidine (ZANAFLEX) 4 MG tablet    Sig: Take 1 tablet (4 mg total) by mouth daily as needed for muscle spasms.    Dispense:  30 tablet    Refill:  2    Do not place this medication, or any other prescription from our practice, on "Automatic Refill". Patient may have prescription filled one day early if pharmacy is closed on scheduled refill date.   Orders:  Orders Placed This Encounter  Procedures  . RFA - Genicular (Schedule)    Standing Status:   Future    Standing Expiration Date:   05/25/2021    Scheduling Instructions:     Side(s): LEFT     Level(s): Superior-Lateral, Superior-Medial, and Inferior-Medial Genicular Nerve(s)     Sedation: With Sedation     Scheduling Timeframe: As soon as pre-approved    Order Specific Question:   Where will this procedure be performed?    Answer:   [facility]   Follow-up plan:   Return in about 2 weeks (around 12/10/2019) for Procedure: Left GN RFA w/ sedation.     s/p GNB (R) 07/18/19, RFA on  08/29/2019; L GNB on 11/4/020 (LOCAL ONLY per pt request)     Recent Visits Date Type Provider Dept  10/31/19 Procedure visit [name], MD [facility]  10/22/19 Office Visit [name], MD [facility]  09/06/19 Office Visit [name], MD [facility]  08/29/19 Procedure visit [name], MD [facility]  Showing recent visits within past 90 days and meeting all other requirements   Today's Visits Date Type Provider Dept  11/26/19 Office Visit [name], MD [facility]  Showing today's visits and meeting all other requirements   Future Appointments No visits were found meeting these conditions.  Showing future appointments within next 90 days and meeting all other requirements   I discussed the assessment and treatment plan with the patient. The patient was provided an opportunity to ask questions and all were answered. The patient agreed with the plan and demonstrated an understanding of the instructions.  Patient advised to call back or seek an in-person evaluation if the symptoms or condition worsens.  Total duration of non-face-to-face encounter: 25 minutes.  Note by: [name], MD Date: 11/26/2019; Time: 12:29 PM  Note: This dictation was prepared with Dragon dictation. Any transcriptional errors that may result from this process are unintentional.  Disclaimer:  * Given the special circumstances of the COVID-19 pandemic, the federal government has announced that the Office for Civil Rights (OCR) will exercise its enforcement discretion and will not impose penalties on physicians using telehealth in the event of noncompliance with regulatory requirements under the [place] (HIPAA) in connection with the good faith provision of telehealth during the XX123456 national public health emergency. ([place])

## 2019-11-26 NOTE — Telephone Encounter (Signed)
Talked with patient to let her kinow that pharmacy had called and was concerned about drug combination.  Patient is very aware of her medication regime and feels that she takes her medications appropriately and has for years.  I explained that he was just concerned about the combination.

## 2019-11-26 NOTE — Assessment & Plan Note (Signed)
  Orders Placed This Encounter  Procedures  . RFA - Genicular (Schedule)    Standing Status:   Future    Standing Expiration Date:   05/25/2021    Scheduling Instructions:     Side(s): LEFT     Level(s): Superior-Lateral, Superior-Medial, and Inferior-Medial Genicular Nerve(s)     Sedation: With Sedation     Scheduling Timeframe: As soon as pre-approved    Order Specific Question:   Where will this procedure be performed?    Answer:   [facility]

## 2019-12-06 ENCOUNTER — Encounter: Payer: Medicare Other | Admitting: Student in an Organized Health Care Education/Training Program

## 2019-12-17 ENCOUNTER — Ambulatory Visit: Payer: Medicare Other | Admitting: Student in an Organized Health Care Education/Training Program

## 2019-12-18 ENCOUNTER — Other Ambulatory Visit: Payer: Self-pay | Admitting: Pulmonary Disease

## 2019-12-31 ENCOUNTER — Telehealth: Payer: Self-pay | Admitting: Student in an Organized Health Care Education/Training Program

## 2019-12-31 NOTE — Telephone Encounter (Signed)
Pharmacy lvmail stating they need permission to fill Tizanidine, due to all the meds she is taking, they will not fill until they receive a call from [place].

## 2020-01-02 ENCOUNTER — Ambulatory Visit
Admission: RE | Admit: 2020-01-02 | Discharge: 2020-01-02 | Disposition: A | Discharge status: Home / Self Care | Payer: Medicare Other | Source: Ambulatory Visit | Attending: Internal Medicine | Admitting: Internal Medicine

## 2020-01-02 DIAGNOSIS — Z1231 Encounter for screening mammogram for malignant neoplasm of breast: Secondary | ICD-10-CM

## 2020-01-29 ENCOUNTER — Telehealth: Payer: Self-pay

## 2020-01-29 NOTE — Telephone Encounter (Signed)
The patient called to schedule a med refill and procedure, she also wanted to tell Dr. Holley Raring that her spinal cord stimulator is not working and she cant get the remote to work either.

## 2020-01-29 NOTE — Telephone Encounter (Signed)
Patient does not have ant contact information for [facility]. I called Annye English [phone], message left.

## 2020-01-30 ENCOUNTER — Encounter: Payer: Self-pay | Admitting: Student in an Organized Health Care Education/Training Program

## 2020-01-30 ENCOUNTER — Ambulatory Visit
Payer: Medicare Other | Attending: Student in an Organized Health Care Education/Training Program | Admitting: Student in an Organized Health Care Education/Training Program

## 2020-01-30 ENCOUNTER — Other Ambulatory Visit: Payer: Self-pay

## 2020-01-30 DIAGNOSIS — G894 Chronic pain syndrome: Secondary | ICD-10-CM

## 2020-01-30 DIAGNOSIS — M4726 Other spondylosis with radiculopathy, lumbar region: Secondary | ICD-10-CM | POA: Diagnosis not present

## 2020-01-30 DIAGNOSIS — M48061 Spinal stenosis, lumbar region without neurogenic claudication: Secondary | ICD-10-CM | POA: Diagnosis not present

## 2020-01-30 DIAGNOSIS — M1712 Unilateral primary osteoarthritis, left knee: Secondary | ICD-10-CM

## 2020-01-30 DIAGNOSIS — M961 Postlaminectomy syndrome, not elsewhere classified: Secondary | ICD-10-CM | POA: Diagnosis not present

## 2020-01-30 DIAGNOSIS — M4804 Spinal stenosis, thoracic region: Secondary | ICD-10-CM

## 2020-01-30 DIAGNOSIS — M797 Fibromyalgia: Secondary | ICD-10-CM

## 2020-01-30 DIAGNOSIS — Z9689 Presence of other specified functional implants: Secondary | ICD-10-CM

## 2020-01-30 DIAGNOSIS — M47816 Spondylosis without myelopathy or radiculopathy, lumbar region: Secondary | ICD-10-CM

## 2020-01-30 MED ORDER — OXYCODONE-ACETAMINOPHEN 10-325 MG PO TABS: 1.0000 | ORAL_TABLET | Freq: Two times a day (BID) | ORAL | 0 refills | Status: DC | PRN

## 2020-01-30 MED ORDER — OXYCODONE-ACETAMINOPHEN 10-325 MG PO TABS: 1.0000 | ORAL_TABLET | Freq: Two times a day (BID) | ORAL | 0 refills | Status: AC | PRN

## 2020-01-30 NOTE — Progress Notes (Signed)
Patient: Brittney [name]  Service Category: E/M  Provider: [name], MD  DOB: 1951-10-16  DOS: 01/30/2020  Location: Office  MRN: [id number]  Setting: Ambulatory outpatient  Referring Provider: Rusty Aus, MD  Type: Established Patient  Specialty: Interventional Pain Management  PCP: Rusty Aus, MD  Location: Home  Delivery: TeleHealth     Virtual Encounter - Pain Management PROVIDER NOTE: Information contained herein reflects review and annotations entered in association with encounter. Interpretation of such information and data should be left to medically-trained personnel. Information provided to patient can be located elsewhere in the medical record under "Patient Instructions". Document created using STT-dictation technology, any transcriptional errors that may result from process are unintentional.    Contact & Pharmacy Preferred: [phone] Home: [phone] (home) Mobile: [phone] (mobile) E-mail: [email or web address]  [id number] - [place], [place] - [address] [address] Phone: [phone] Fax: [phone]   Pre-screening  Brittney [name] offered "in-person" vs "virtual" encounter. She indicated preferring virtual for this encounter.   Reason COVID-19*  Social distancing based on CDC and AMA recommendations.   I contacted Brittney [name] on 01/30/2020 via telephone.      I clearly identified myself as [name], MD. I verified that I was speaking with the correct person using two identifiers (Name: Brittney [name], and date of birth: April 10, 1951).  This visit was completed via telephone due to the restrictions of the COVID-19 pandemic. All issues as above were discussed and addressed but no physical exam was performed. If it was felt that the patient should be evaluated in the office, they were directed there. The patient verbally consented to this visit. Patient was unable to complete an audio/visual visit due to Technical difficulties  and/or Lack of internet. Due to the catastrophic nature of the COVID-19 pandemic, this visit was done through audio contact only.  Location of the patient: home address (see Epic for details)  Location of the provider: office Consent I sought verbal advanced consent from Brittney [name] for virtual visit interactions. I informed Ms. Schricker of possible security and privacy concerns, risks, and limitations associated with providing "not-in-person" medical evaluation and management services. I also informed Ms. Beier of the availability of "in-person" appointments. Finally, I informed her that there would be a charge for the virtual visit and that she could be  personally, fully or partially, financially responsible for it. Ms. Vermeersch expressed understanding and agreed to proceed.   Historic Elements   Brittney [name] is a 69 y.o. year old, female patient evaluated today after her last encounter by our practice on 01/29/2020. Brittney [name]  has a past medical history of Abdominal pain, left upper quadrant (12/11/2012), Arthritis, Asthma, Broken leg (2014), Cancer ([place]) (2007), Chronic kidney disease, stage 2 (mild), Chronic lower back pain, Collagen vascular disease ([place]), Coronary artery disease, Diabetes mellitus without complication ([place]), Diabetic nephropathy associated with secondary diabetes mellitus ([place]), Flushing (12/11/2012), Gastroparesis, Gross hematuria (12/11/2012), Hepatic cirrhosis ([place]), Hyperlipidemia, Hypertension, Hypothyroidism, Hypothyroidism, IBS (irritable bowel syndrome), Kidney stone (12/11/2012), Lower extremity edema, Morbid obesity with BMI of 40.0-44.9, adult ([place]), Myocardial infarction ([place]) (09/2013), Nausea without vomiting (12/11/2012), Nephrolithiasis, Nephrolithiasis (04/26/2014), Nonproliferative retinopathy due to secondary diabetes ([place]), Numbness and tingling of right leg, Peripheral vascular disease ([place]), Renal colic (123456), Renal insufficiency, Sciatica,  Sciatica (12/11/2012), Skin cancer (08/2018), Sleep apnea, and Unilateral small kidney without contralateral hypertrophy. She also  has a past surgical history that includes Cesarean section (1980); Dilation  and curettage of uterus; Eye surgery (1986); Abdominal hysterectomy; Tonsillectomy; Esophagogastroduodenoscopy (02/08/2014); Colonoscopy (05/04/2001); Esophagogastroduodenoscopy (egd) with propofol (N/A, 09/20/2016); Colonoscopy with propofol (N/A, 09/20/2016); Mohs surgery (11/2016); Cholecystectomy; Hernia repair; Joint replacement (Left, 09/24/2013); Back surgery (04/1999); Pulse generator implant (N/A, 11/02/2017); and Esophagogastroduodenoscopy (egd) with propofol (N/A, 05/23/2018). Brittney [name], alprazolam, bumetanide, butalbital-acetaminophen-caffeine, candesartan, carvedilol, ciprofloxacin, clonidine, diclofenac sodium, doxazosin, escitalopram, flovent hfa, fluticasone, furosemide, gabapentin, hydralazine, ibuprofen, insulin detemir, insulin regular human concentrated, levothyroxine, linzess, metoclopramide, nitroglycerin, ondansetron, oxycodone-acetaminophen, [START ON 02/29/2020] oxycodone-acetaminophen, [START ON 03/30/2020] oxycodone-acetaminophen, pantoprazole, potassium chloride, potassium chloride, pramipexole, tramadol, trazodone, and zolpidem. She  reports that she has never smoked. She has never used smokeless tobacco. She reports that she does not drink alcohol or use drugs. Ms. Chari is allergic to eggs or egg-derived products; shellfish allergy; amlodipine; codeine; imdur [isosorbide dinitrate]; lyrica [pregabalin]; monosodium glutamate; and tape.   HPI  Today, she is being contacted for medication management.   Having increased pain in her left hip without any radiation. No inciting or traumatic event. Received Cortisone injection Monday with Dr Sabra Heck, is supposed to to have x-rays done today at primary  care provider's office, Dr. Sabra Heck.  She is due for refill of her oxycodone as below.  Informed patient that we need to complete annual urine toxicology screen for medication compliance and monitoring.  I also had a discussion with the patient regarding her sedative medications.  I recommend that she discontinue tizanidine.  She states that she rarely takes this medication be given its central acting effects, think it is best if she avoids this medication if possible.  Pharmacotherapy Assessment  Analgesic:  12/17/2019  1   09/06/2019  Oxycodone-Acetaminophen 10-325  60.00  30 Bi Lat   [id number]   Nor (1409)   0  30.00 MME  Medicare   [place]   Monitoring: [place] PMP: PDMP reviewed during this encounter.       Pharmacotherapy: No side-effects or adverse reactions reported. Compliance: No problems identified. Effectiveness: Clinically acceptable. Plan: Refer to "POC".  UDS:  Summary  Date Value Ref Range Status  01/03/2019 FINAL  Final    Comment:    ==================================================================== TOXASSURE SELECT 13 (MW) ==================================================================== Test                             Result       Flag       Units Drug Present and Declared for Prescription Verification   Alprazolam                     165          EXPECTED   ng/mg creat   Alpha-hydroxyalprazolam        412          EXPECTED   ng/mg creat    Source of alprazolam is a scheduled prescription medication.    Alpha-hydroxyalprazolam is an expected metabolite of alprazolam.   Oxycodone                      812          EXPECTED   ng/mg creat   Oxymorphone                    932          EXPECTED   ng/mg creat   Noroxycodone  1294         EXPECTED   ng/mg creat   Noroxymorphone                 332          EXPECTED   ng/mg creat    Sources of oxycodone are scheduled prescription medications.    Oxymorphone, noroxycodone, and noroxymorphone are expected     metabolites of oxycodone. Oxymorphone is also available as a    scheduled prescription medication.   Tramadol                       2565         EXPECTED   ng/mg creat   O-Desmethyltramadol            7935         EXPECTED   ng/mg creat   N-Desmethyltramadol            2141         EXPECTED   ng/mg creat    Source of tramadol is a prescription medication.    O-desmethyltramadol and N-desmethyltramadol are expected    metabolites of tramadol.   Butalbital                     PRESENT      EXPECTED ==================================================================== Test                      Result    Flag   Units      Ref Range   Creatinine              34               mg/dL      >=20 ==================================================================== Declared Medications:  The flagging and interpretation on this report are based on the  following declared medications.  Unexpected results may arise from  inaccuracies in the declared medications.  **Note: The testing scope of this panel includes these medications:  Alprazolam  Butalbital (Butalbital/APAP/Caffeine)  Oxycodone (Percocet)  Tramadol (Ultram)  **Note: The testing scope of this panel does not include following  reported medications:  Acetaminophen (Butalbital/APAP/Caffeine)  Acetaminophen (Percocet)  [name]  Bumetanide  Caffeine (Butalbital/APAP/Caffeine)  Candesartan  Carvedilol  Clonidine  Cyclobenzaprine (Flexeril)  Doxazosin (Cardura)  Escitalopram (Lexapro)  Fluticasone (Flonase)  Fluticasone (Flovent)  Gabapentin (Neurontin)  Hydralazine (Apresoline)  Ibuprofen (Advil)  Insulin (Humulin)  Insulin (Levemir)  Lactulose  Levothyroxine  Lubiprostone (Amitiza)  Metoclopramide (Reglan)  Pantoprazole (Protonix)  Potassium  Pramipexole (Mirapex)  Sucralfate (Carafate)  Topical Diclofenac (Voltaren)  Trazodone (Desyrel)  Zolpidem  (Ambien) ==================================================================== For clinical consultation, please call [phone]. ====================================================================    Laboratory Chemistry Profile (12 mo)  Renal: 08/01/2019: BUN 10; Creatinine, Ser 0.64  Lab Results  Component Value Date   GFRAA >60 08/01/2019   GFRNONAA >60 08/01/2019   Hepatic: 07/30/2019: Albumin 3.3 Lab Results  Component Value Date   AST 39 07/30/2019   ALT 34 07/30/2019   Other: No results found for requested labs within last 8760 hours.  Note: Above Lab results reviewed.  Imaging  MM 3D SCREEN BREAST BILATERAL CLINICAL DATA:  Screening.  EXAM: DIGITAL SCREENING BILATERAL MAMMOGRAM WITH TOMO AND CAD  COMPARISON:  Previous exam(s).  ACR Breast Density Category b: There are scattered areas of fibroglandular density.  FINDINGS: There are no findings suspicious for malignancy. Images were processed with CAD.  IMPRESSION: No  mammographic evidence of malignancy. A result letter of this screening mammogram will be mailed directly to the patient.  RECOMMENDATION: Screening mammogram in one year. (Code:SM-B-01Y)  BI-RADS CATEGORY  1: Negative.  Electronically Signed   By: Ammie Ferrier M.D.   On: 01/02/2020 13:25   Assessment  The primary encounter diagnosis was Primary osteoarthritis of left knee. Diagnoses of L3-4 severe lumbar facet hypertrophy and spinal stenosis, L3-4 severe lumbar spinal stenosis (12/31/2016 MRI), Failed back surgical syndrome, Spinal stenosis of thoracic region, Fibromyalgia, Spinal cord stimulator status ([place]), and Chronic pain syndrome were also pertinent to this visit.  Plan of Care   I have discontinued Brittney [name] "Pam"'s cyclobenzaprine and tiZANidine. I am also having her start on oxyCODONE-acetaminophen, oxyCODONE-acetaminophen, and oxyCODONE-acetaminophen. Additionally, I am having her maintain her ALPRAZolam,  diclofenac sodium, doxazosin, escitalopram, hydrALAZINE, metoCLOPramide, pantoprazole, traZODone, zolpidem, insulin regular human CONCENTRATED, [name], butalbital-acetaminophen-caffeine, fluticasone, Insulin Detemir, pramipexole, bumetanide, carvedilol, cloNIDine, levothyroxine, traMADol, ibuprofen, candesartan, Linzess, ondansetron, gabapentin, potassium chloride, ciprofloxacin, furosemide, nitroGLYCERIN, potassium chloride, and Flovent HFA.  Keep appointment for February 17 for left genicular nerve radiofrequency ablation. Refill oxycodone as below. Continue with spinal cord stimulator Minimize/avoid tizanidine if at all possible. Repeat UDS  Pharmacotherapy (Medications Ordered): Meds ordered this encounter  Medications  . oxyCODONE-acetaminophen (PERCOCET) 10-325 MG tablet    Sig: Take 1 tablet by mouth every 12 (twelve) hours as needed for pain. Must last 30 days.    Dispense:  60 tablet    Refill:  0    Chronic Pain. (STOP Act - Not applicable). Fill one day early if closed on scheduled refill date.  Marland Kitchen oxyCODONE-acetaminophen (PERCOCET) 10-325 MG tablet    Sig: Take 1 tablet by mouth every 12 (twelve) hours as needed for pain. Must last 30 days.    Dispense:  60 tablet    Refill:  0    Chronic Pain. (STOP Act - Not applicable). Fill one day early if closed on scheduled refill date.  Marland Kitchen oxyCODONE-acetaminophen (PERCOCET) 10-325 MG tablet    Sig: Take 1 tablet by mouth every 12 (twelve) hours as needed for pain. Must last 30 days.    Dispense:  60 tablet    Refill:  0    Chronic Pain. (STOP Act - Not applicable). Fill one day early if closed on scheduled refill date.   Orders:  Orders Placed This Encounter  Procedures  . ToxASSURE Select 13 (MW), Urine    Volume: 30 ml(s). Minimum 3 ml of urine is needed. Document temperature of fresh sample. Indications: Long term (current) use of opiate analgesic [id number])   Follow-up plan:   Return in about 3 months (around 04/28/2020) for  Medication Management.     s/p GNB (R) 07/18/19, RFA on 08/29/2019; L GNB on 11/4/020 (LOCAL ONLY per pt request)      Recent Visits Date Type Provider Dept  11/26/19 Office Visit [name], MD [facility]  Showing recent visits within past 90 days and meeting all other requirements   Today's Visits Date Type Provider Dept  01/30/20 Office Visit [name], MD [facility]  Showing today's visits and meeting all other requirements   Future Appointments Date Type Provider Dept  02/13/20 Appointment [name], MD [facility]  Showing future appointments within next 90 days and meeting all other requirements   I discussed the assessment and treatment plan with the patient. The patient was provided an opportunity to ask questions and all were answered. The patient  agreed with the plan and demonstrated an understanding of the instructions.  Patient advised to call back or seek an in-person evaluation if the symptoms or condition worsens.  Duration of encounter: 30 minutes.  Note by: [name], MD Date: 01/30/2020; Time: 9:28 AM

## 2020-01-30 NOTE — Telephone Encounter (Signed)
I contacted Truman Hayward with [facility], he will contact patient.

## 2020-02-09 NOTE — Progress Notes (Deleted)
Brittney [name]  Telephone:([phone]) [phone] Fax:(336) [phone]  ID: Brittney [name] OB: Mar 14, 1951  MR#: [id number]  [id number]  Patient Care Team: Rusty Aus, MD as PCP - General (Internal Medicine)  CHIEF COMPLAINT: Iron deficiency anemia.  INTERVAL HISTORY: Patient returns to clinic today for repeat laboratory for further evaluation.  She currently feels well and is asymptomatic.  She does not complain of weakness or fatigue today.  She has no neurologic complaints. She denies any recent fevers or illnesses. She has a good appetite and denies weight loss.  She denies any chest pain, shortness of breath, cough, or hemoptysis.  She has no nausea, vomiting, constipation, or diarrhea. She denies any melena or hematochezia. She has no urinary complaints.  Patient feels at her baseline offers no specific complaints today.  REVIEW OF SYSTEMS:   Review of Systems  Constitutional: Negative for fever, malaise/fatigue and weight loss.  Respiratory: Negative.  Negative for cough, hemoptysis and shortness of breath.   Cardiovascular: Negative.  Negative for chest pain and leg swelling.  Gastrointestinal: Negative.  Negative for abdominal pain, blood in stool and melena.  Genitourinary: Negative.  Negative for dysuria.  Musculoskeletal: Negative.  Negative for back pain.  Skin: Negative.  Negative for rash.  Neurological: Negative.  Negative for sensory change, focal weakness, weakness and headaches.  Psychiatric/Behavioral: Negative.  The patient is not nervous/anxious.     As per HPI. Otherwise, a complete review of systems is negative.  PAST MEDICAL HISTORY: Past Medical History:  Diagnosis Date  . Abdominal pain, left upper quadrant 12/11/2012  . Arthritis   . Asthma   . Broken leg 2014   fell twice and broke same leg. had a bone stimulator for first break.  . Cancer ([place]) 2007   melanoma, left ear and back of left leg, removed from back  . Chronic kidney disease,  stage 2 (mild)   . Chronic lower back pain   . Collagen vascular disease ([place])   . Coronary artery disease   . Diabetes mellitus without complication ([place])   . Diabetic nephropathy associated with secondary diabetes mellitus ([place])   . [place] 12/11/2012  . Gastroparesis   . Gross hematuria 12/11/2012  . Hepatic cirrhosis ([place])   . Hyperlipidemia   . Hypertension   . Hypothyroidism   . Hypothyroidism   . IBS (irritable bowel syndrome)   . Kidney stone 12/11/2012  . Lower extremity edema   . Morbid obesity with BMI of 40.0-44.9, adult ([place])   . Myocardial infarction ([place]) 09/2013  . Nausea without vomiting 12/11/2012  . Nephrolithiasis   . Nephrolithiasis 04/26/2014  . Nonproliferative retinopathy due to secondary diabetes ([place])   . Numbness and tingling of right leg   . Peripheral vascular disease ([place])   . Renal colic 123456  . Renal insufficiency   . Sciatica   . Sciatica 12/11/2012  . Skin cancer 08/2018  . Sleep apnea    sleep study coming in november  . Unilateral small kidney without contralateral hypertrophy     PAST SURGICAL HISTORY: Past Surgical History:  Procedure Laterality Date  . ABDOMINAL HYSTERECTOMY    . BACK SURGERY  04/1999   L-4-5 LAMINECTOMY. metal in lower back and neck  . CESAREAN SECTION  1980  . CHOLECYSTECTOMY    . COLONOSCOPY  05/04/2001  . COLONOSCOPY WITH PROPOFOL N/A 09/20/2016   Procedure: COLONOSCOPY WITH PROPOFOL;  Surgeon: Lollie Sails, MD;  Location: [facility] ENDOSCOPY;  Service: Endoscopy;  Laterality: N/A;  .  DILATION AND CURETTAGE OF UTERUS    . ESOPHAGOGASTRODUODENOSCOPY  02/08/2014  . ESOPHAGOGASTRODUODENOSCOPY (EGD) WITH PROPOFOL N/A 09/20/2016   Procedure: ESOPHAGOGASTRODUODENOSCOPY (EGD) WITH PROPOFOL;  Surgeon: Lollie Sails, MD;  Location: [facility] ENDOSCOPY;  Service: Endoscopy;  Laterality: N/A;  . ESOPHAGOGASTRODUODENOSCOPY (EGD) WITH PROPOFOL N/A 05/23/2018   Procedure: ESOPHAGOGASTRODUODENOSCOPY (EGD) WITH PROPOFOL;   Surgeon: Lollie Sails, MD;  Location: [facility] ENDOSCOPY;  Service: Endoscopy;  Laterality: N/A;  . EYE SURGERY  1986   FOR MELANOMA  . HERNIA REPAIR    . JOINT REPLACEMENT Left 09/24/2013   TOTAL KNEE  . MOHS SURGERY  11/2016   left side of nose  . PULSE GENERATOR IMPLANT N/A 11/02/2017   Procedure: UNILATERAL PULSE GENERATOR IMPLANT;  Surgeon: Meade Maw, MD;  Location: [facility] ORS;  Service: Neurosurgery;  Laterality: N/A;  . TONSILLECTOMY     WITH ADENOIDECTOMY    FAMILY HISTORY: Family History  Problem Relation Age of Onset  . Heart disease Mother   . Cancer Mother   . Asthma Mother   . Diabetes Father   . Kidney disease Father   . Hypertension Father   . Breast cancer Neg Hx     ADVANCED DIRECTIVES (Y/N):  N  HEALTH MAINTENANCE: Social History   Tobacco Use  . Smoking status: Never Smoker  . Smokeless tobacco: Never Used  Substance Use Topics  . Alcohol use: No  . Drug use: No     Colonoscopy:  PAP:  Bone density:  Lipid panel:  Allergies  Allergen Reactions  . Eggs Or Egg-Derived Products Nausea And Vomiting    Only has reaction to flu vaccine due to eggs, no reaction to egg food products  . Shellfish Allergy Nausea And Vomiting, Diarrhea and Nausea Only    Non stop sickness once ingests shellfish. Betadine is okay  . Amlodipine Cough  . Codeine Nausea And Vomiting and Nausea Only    states she can take codeine cough syrup without problems states she can take codeine cough syrup without problems  . Imdur [Isosorbide Dinitrate] Other (See Comments)    Headache  . Lyrica [Pregabalin] Swelling and Other (See Comments)    "FLU-LIKE" SYMPTOMS  . Monosodium Glutamate Other (See Comments)    Headache  . Tape Other (See Comments) and Nausea And Vomiting    can use paper tape Redness    Current Outpatient Medications  Medication Sig Dispense Refill  . albuterol (VENTOLIN HFA) 108 (90 Base) MCG/ACT inhaler Inhale 2 puffs into the lungs 3 (three)  times daily as needed for wheezing or shortness of breath.     . ALPRAZolam (XANAX) 0.5 MG tablet Take 0.5 mg by mouth 4 (four) times daily.     . bumetanide (BUMEX) 1 MG tablet Take 1.5 mg by mouth daily.     . butalbital-acetaminophen-caffeine (FIORICET, ESGIC) 50-325-40 MG tablet Take 1 tablet by mouth 2 (two) times daily as needed for migraine.     . candesartan (ATACAND) 32 MG tablet Take 1 tablet (32 mg total) by mouth daily. 30 tablet 0  . carvedilol (COREG) 25 MG tablet Take 50 mg by mouth 2 (two) times daily with a meal.     . ciprofloxacin (CIPRO) 500 MG tablet     . cloNIDine (CATAPRES - DOSED IN MG/24 HR) 0.3 mg/24hr patch Place 0.3 mg onto the skin once a week.     . diclofenac sodium (VOLTAREN) 1 % GEL Apply 2 g topically daily as needed (pain).     Marland Kitchen  doxazosin (CARDURA) 4 MG tablet Take 4 mg by mouth every evening.     . escitalopram (LEXAPRO) 10 MG tablet Take 10 mg by mouth daily.     Marland Kitchen FLOVENT HFA 220 MCG/ACT inhaler TAKE 2 PUFFS BY MOUTH TWICE A DAY 36 Inhaler 0  . fluticasone (FLONASE) 50 MCG/ACT nasal spray Place 2 sprays into both nostrils 2 (two) times daily.     . furosemide (LASIX) 40 MG tablet Take 40 mg by mouth daily.     Marland Kitchen gabapentin (NEURONTIN) 600 MG tablet Take 600-1,800 mg by mouth See admin instructions. Take 1 tablet (600mg ) by mouth twice daily and take 3 tablets (1800mg ) by mouth at bedtime    . hydrALAZINE (APRESOLINE) 50 MG tablet Take 50 mg by mouth 3 (three) times daily.     Marland Kitchen ibuprofen (ADVIL,MOTRIN) 800 MG tablet Take 800 mg by mouth 3 (three) times daily as needed for mild pain or moderate pain.     . Insulin Detemir (LEVEMIR FLEXTOUCH) 100 UNIT/ML Pen Inject 140 Units into the skin 2 (two) times daily.     . insulin regular human CONCENTRATED (HUMULIN R) 500 UNIT/ML kwikpen Inject 40-80 Units into the skin See admin instructions. Inject 80u under the skin at breakfast, inject 65-70u under the skin at lunch and inject 40-80u under the skin at dinner time  based on blood glucose readings    . levothyroxine (SYNTHROID, LEVOTHROID) 150 MCG tablet Take 150 mcg by mouth daily before breakfast.   11  . LINZESS 290 MCG CAPS capsule Take 290 mcg by mouth daily.    . metoCLOPramide (REGLAN) 10 MG tablet Take 10 mg by mouth 3 (three) times daily.    . nitroGLYCERIN (NITRODUR - DOSED IN MG/24 HR) 0.4 mg/hr patch Place onto the skin.    Marland Kitchen ondansetron (ZOFRAN) 4 MG tablet Take 4 mg by mouth every 12 (twelve) hours as needed for nausea or vomiting.     Marland Kitchen oxyCODONE-acetaminophen (PERCOCET) 10-325 MG tablet Take 1 tablet by mouth every 12 (twelve) hours as needed for pain. Must last 30 days. 60 tablet 0  . [START ON 02/29/2020] oxyCODONE-acetaminophen (PERCOCET) 10-325 MG tablet Take 1 tablet by mouth every 12 (twelve) hours as needed for pain. Must last 30 days. 60 tablet 0  . [START ON 03/30/2020] oxyCODONE-acetaminophen (PERCOCET) 10-325 MG tablet Take 1 tablet by mouth every 12 (twelve) hours as needed for pain. Must last 30 days. 60 tablet 0  . pantoprazole (PROTONIX) 40 MG tablet Take 40 mg by mouth 2 (two) times daily.     . potassium chloride (K-DUR) 10 MEQ tablet Take 10 mEq by mouth daily.    . potassium chloride (KLOR-CON M10) 10 MEQ tablet Take by mouth.    . pramipexole (MIRAPEX) 0.5 MG tablet Take 0.5 mg by mouth 2 (two) times daily.     . traMADol (ULTRAM) 50 MG tablet Take 100 mg by mouth at bedtime as needed.   3  . traZODone (DESYREL) 150 MG tablet Take 150 mg by mouth at bedtime.     Marland Kitchen zolpidem (AMBIEN) 10 MG tablet Take 10 mg by mouth at bedtime.      No current facility-administered medications for this visit.    OBJECTIVE: There were no vitals filed for this visit.   There is no height or weight on file to calculate BMI.    ECOG FS:0 - Asymptomatic  General: Well-developed, well-nourished, no acute distress. Eyes: Pink conjunctiva, anicteric sclera. HEENT: Normocephalic, moist mucous membranes.  Lungs: Clear to auscultation  bilaterally. Heart: Regular rate and rhythm. No rubs, murmurs, or gallops. Abdomen: Soft, nontender, nondistended. No organomegaly noted, normoactive bowel sounds. Musculoskeletal: No edema, cyanosis, or clubbing. Neuro: Alert, answering all questions appropriately. Cranial nerves grossly intact. Skin: No rashes or petechiae noted. Psych: Normal affect.  LAB RESULTS:  Lab Results  Component Value Date   NA 138 08/01/2019   K 3.9 08/01/2019   CL 101 08/01/2019   CO2 29 08/01/2019   GLUCOSE 198 (H) 08/01/2019   BUN 10 08/01/2019   CREATININE 0.64 08/01/2019   CALCIUM 9.6 08/01/2019   PROT 7.2 07/30/2019   ALBUMIN 3.3 (L) 07/30/2019   AST 39 07/30/2019   ALT 34 07/30/2019   ALKPHOS 125 07/30/2019   BILITOT 0.5 07/30/2019   GFRNONAA >60 08/01/2019   GFRAA >60 08/01/2019    Lab Results  Component Value Date   WBC 5.9 08/01/2019   NEUTROABS 6.7 07/30/2019   HGB 10.5 (L) 08/01/2019   HCT 34.0 (L) 08/01/2019   MCV 87.2 08/01/2019   PLT 144 (L) 08/01/2019   Lab Results  Component Value Date   IRON 91 07/24/2019   TIBC 292 07/24/2019   IRONPCTSAT 31 07/24/2019   Lab Results  Component Value Date   FERRITIN 27 07/24/2019     STUDIES: No results found.  ASSESSMENT: Iron deficiency anemia.  PLAN:    1. Iron deficiency anemia: Patient's hemoglobin remains mildly decreased, but essentially stable at 10.8.  Her iron stores continue to be within normal limits.  Previously, the remainder of her laboratory work was also either negative or within normal limits.  Colonoscopy and EGD completed on September 20, 2016 were essentially normal other than some mild gastritis. She does not require additional Feraheme today. Patient last received treatment on July 13, 2017.  After discussion with the patient, it was decided that no further follow-up is necessary.  Please refer patient back if there are any questions or concerns. 2. Hypertension: Patient's blood pressure is moderately  elevated today. Continue evaluation and treatment per primary care. 3. Back pain: Patient does not complain of this today.  Patient has a permanent spinal stimulator in place.  Patient expressed understanding and was in agreement with this plan. She also understands that She can call clinic at any time with any questions, concerns, or complaints.    Lloyd Huger, MD   02/09/2020 10:54 AM

## 2020-02-13 ENCOUNTER — Ambulatory Visit (HOSPITAL_BASED_OUTPATIENT_CLINIC_OR_DEPARTMENT_OTHER): Payer: Medicare Other | Admitting: Student in an Organized Health Care Education/Training Program

## 2020-02-13 ENCOUNTER — Other Ambulatory Visit: Payer: Self-pay

## 2020-02-13 ENCOUNTER — Ambulatory Visit
Admission: RE | Admit: 2020-02-13 | Discharge: 2020-02-13 | Disposition: A | Discharge status: Home / Self Care | Payer: Medicare Other | Source: Ambulatory Visit | Attending: Student in an Organized Health Care Education/Training Program | Admitting: Student in an Organized Health Care Education/Training Program

## 2020-02-13 ENCOUNTER — Encounter: Payer: Self-pay | Admitting: Student in an Organized Health Care Education/Training Program

## 2020-02-13 DIAGNOSIS — G8929 Other chronic pain: Secondary | ICD-10-CM | POA: Insufficient documentation

## 2020-02-13 DIAGNOSIS — M1712 Unilateral primary osteoarthritis, left knee: Secondary | ICD-10-CM | POA: Diagnosis not present

## 2020-02-13 MED ORDER — FENTANYL CITRATE (PF) 100 MCG/2ML IJ SOLN
INTRAMUSCULAR | Status: AC
  Filled 2020-02-13: qty 2

## 2020-02-13 MED ORDER — DEXAMETHASONE SODIUM PHOSPHATE 10 MG/ML IJ SOLN
INTRAMUSCULAR | Status: AC
  Filled 2020-02-13: qty 1

## 2020-02-13 MED ORDER — ROPIVACAINE HCL 2 MG/ML IJ SOLN
9.0000 mL | Freq: Once | INTRAMUSCULAR | Status: AC
  Administered 2020-02-13: 10 mL via PERINEURAL

## 2020-02-13 MED ORDER — ROPIVACAINE HCL 2 MG/ML IJ SOLN
INTRAMUSCULAR | Status: AC
  Filled 2020-02-13: qty 10

## 2020-02-13 MED ORDER — LIDOCAINE HCL 2 % IJ SOLN
INTRAMUSCULAR | Status: AC
  Filled 2020-02-13: qty 20

## 2020-02-13 MED ORDER — LIDOCAINE HCL 2 % IJ SOLN
20.0000 mL | Freq: Once | INTRAMUSCULAR | Status: AC
  Administered 2020-02-13: 400 mg

## 2020-02-13 MED ORDER — DEXAMETHASONE SODIUM PHOSPHATE 10 MG/ML IJ SOLN: 10.0000 mg | Freq: Once | INTRAMUSCULAR | Status: DC

## 2020-02-13 MED ORDER — FENTANYL CITRATE (PF) 100 MCG/2ML IJ SOLN
25.0000 ug | INTRAMUSCULAR | Status: DC | PRN
  Administered 2020-02-13: 09:00:00 75 ug via INTRAVENOUS

## 2020-02-13 NOTE — Patient Instructions (Signed)

## 2020-02-13 NOTE — Progress Notes (Signed)
Patient's Name: Brittney [name]  MRN: [id number]  Referring Provider: [name], MD  DOB: 06-15-1951  PCP: Rusty Aus, MD  DOS: 02/13/2020  Note by: [name], MD  Service setting: Ambulatory outpatient  Specialty: Interventional Pain Management  Patient type: Established  Location: [facility] (AMB) Pain Management Facility  Visit type: Interventional Procedure   Primary Reason for Visit: Interventional Pain Management Treatment. CC: Knee Pain (left )  Procedure:          Anesthesia, Analgesia, Anxiolysis:  Type: Therapeutic Superior-lateral, Superior-medial, and Inferior-medial, Genicular Nerve Radiofrequency Ablation.  #1  Region: Lateral, Anterior, and Medial aspects of the knee joint, above and below the knee joint proper. Level: Superior and inferior to the knee joint. Laterality: Left  Type: Moderate (Conscious) Sedation combined with Local Anesthesia Indication(s): Analgesia and Anxiety Route: Intravenous (IV) IV Access: Secured Sedation: Meaningful verbal contact was maintained at all times during the procedure  Local Anesthetic: Lidocaine 1-2%  Position: Supine   Indications: 1. Primary osteoarthritis of left knee    S/p Right knee RFA on (08/29/2019)- providing relief [employer] spinal cord stimulator representative was also present today to instruct patient how to charge her battery  Brittney [name] has been dealing with the above chronic pain for longer than three months and has either failed to respond, was unable to tolerate, or simply did not get enough benefit from other more conservative therapies including, but not limited to: 1. Over-the-counter medications 2. Anti-inflammatory medications 3. Muscle relaxants 4. Membrane stabilizers 5. Opioids 6. Physical therapy and/or chiropractic manipulation 7. Modalities (Heat, ice, etc.) 8. Invasive techniques such as nerve blocks. [name] has attained more than 50% relief of the pain from a series of diagnostic injections  conducted in separate occasions.  Pain Score: Pre-procedure: 5 /10 Post-procedure: 0-No pain/10  Pre-op Assessment:  [name] is a 69 y.o. (year old), female patient, seen today for interventional treatment. She  has a past surgical history that includes Cesarean section (1980); Dilation and curettage of uterus; Eye surgery (1986); Abdominal hysterectomy; Tonsillectomy; Esophagogastroduodenoscopy (02/08/2014); Colonoscopy (05/04/2001); Esophagogastroduodenoscopy (egd) with propofol (N/A, 09/20/2016); Colonoscopy with propofol (N/A, 09/20/2016); Mohs surgery (11/2016); Cholecystectomy; Hernia repair; Joint replacement (Left, 09/24/2013); Back surgery (04/1999); Pulse generator implant (N/A, 11/02/2017); and Esophagogastroduodenoscopy (egd) with propofol (N/A, 05/23/2018). Brittney [name] has a current medication list which includes the following prescription(s): albuterol, alprazolam, butalbital-acetaminophen-caffeine, candesartan, carvedilol, clonidine, cyclobenzaprine, diclofenac sodium, doxazosin, escitalopram, flovent hfa, fluticasone, furosemide, gabapentin, hydralazine, ibuprofen, insulin detemir, levothyroxine, linzess, metoclopramide, metolazone, nitroglycerin, ondansetron, oxycodone-acetaminophen, [START ON 02/29/2020] oxycodone-acetaminophen, [START ON 03/30/2020] oxycodone-acetaminophen, pantoprazole, potassium chloride, pramipexole, rosuvastatin, tramadol, trazodone, zolpidem, bumetanide, ciprofloxacin, insulin regular human concentrated, and potassium chloride, and the following Facility-Administered Medications: fentanyl. Her primarily concern today is the Knee Pain (left )  Initial Vital Signs:  Pulse/HCG Rate: 68ECG Heart Rate: 63 Temp: 98.3 F (36.8 C) Resp: 16 BP: 132/60 SpO2: 93 %  BMI: Estimated body mass index is 50.36 kg/m as calculated from the following:   Height as of this encounter: 5\' 6"  (1.676 m).   Weight as of this encounter: 312 lb (141.5 kg).  Risk Assessment: Allergies:  Reviewed. She is allergic to eggs or egg-derived products; shellfish allergy; amlodipine; codeine; imdur [isosorbide dinitrate]; lyrica [pregabalin]; monosodium glutamate; and tape.  Allergy Precautions: None required Coagulopathies: Reviewed. None identified.  Blood-thinner therapy: None at this time Active Infection(s): Reviewed. None identified. [name] is afebrile  Site Confirmation: [name] was asked to confirm the procedure and laterality before marking the site  Procedure checklist: Completed Consent: Before the procedure and under the influence of no sedative(s), amnesic(s), or anxiolytics, the patient was informed of the treatment options, risks and possible complications. To fulfill our ethical and legal obligations, as recommended by the American Medical Association's Code of Ethics, I have informed the patient of my clinical impression; the nature and purpose of the treatment or procedure; the risks, benefits, and possible complications of the intervention; the alternatives, including doing nothing; the risk(s) and benefit(s) of the alternative treatment(s) or procedure(s); and the risk(s) and benefit(s) of doing nothing. The patient was provided information about the general risks and possible complications associated with the procedure. These may include, but are not limited to: failure to achieve desired goals, infection, bleeding, organ or nerve damage, allergic reactions, paralysis, and death. In addition, the patient was informed of those risks and complications associated to the procedure, such as failure to decrease pain; infection; bleeding; organ or nerve damage with subsequent damage to sensory, motor, and/or autonomic systems, resulting in permanent pain, numbness, and/or weakness of one or several areas of the body; allergic reactions; (i.e.: anaphylactic reaction); and/or death. Furthermore, the patient was informed of those risks and complications associated with the  medications. These include, but are not limited to: allergic reactions (i.e.: anaphylactic or anaphylactoid reaction(s)); adrenal axis suppression; blood sugar elevation that in diabetics may result in ketoacidosis or comma; water retention that in patients with history of congestive heart failure may result in shortness of breath, pulmonary edema, and decompensation with resultant heart failure; weight gain; swelling or edema; medication-induced neural toxicity; particulate matter embolism and blood vessel occlusion with resultant organ, and/or nervous system infarction; and/or aseptic necrosis of one or more joints. Finally, the patient was informed that Medicine is not an exact science; therefore, there is also the possibility of unforeseen or unpredictable risks and/or possible complications that may result in a catastrophic outcome. The patient indicated having understood very clearly. We have given the patient no guarantees and we have made no promises. Enough time was given to the patient to ask questions, all of which were answered to the patient's satisfaction. Ms. Byrley has indicated that she wanted to continue with the procedure. Attestation: I, the ordering provider, attest that I have discussed with the patient the benefits, risks, side-effects, alternatives, likelihood of achieving goals, and potential problems during recovery for the procedure that I have provided informed consent. Date  Time: 02/13/2020  8:05 AM  Pre-Procedure Preparation:  Monitoring: As per clinic protocol. Respiration, ETCO2, SpO2, BP, heart rate and rhythm monitor placed and checked for adequate function Safety Precautions: Patient was assessed for positional comfort and pressure points before starting the procedure. Time-out: I initiated and conducted the "Time-out" before starting the procedure, as per protocol. The patient was asked to participate by confirming the accuracy of the "Time Out" information. Verification  of the correct person, site, and procedure were performed and confirmed by me, the nursing staff, and the patient. "Time-out" conducted as per Joint Commission's Universal Protocol (UP.01.01.01). Time: [id number]  Description of Procedure:          Target Area: For Genicular Nerve block(s), the targets are: the superior-lateral genicular nerve, located in the lateral distal portion of the femoral shaft as it curves to form the lateral epicondyle, in the region of the distal femoral metaphysis; the superior-medial genicular nerve, located in the medial distal portion of the femoral shaft as it curves to form the medial epicondyle; and the inferior-medial genicular nerve, located  in the medial, proximal portion of the tibial shaft, as it curves to form the medial epicondyle, in the region of the proximal tibial metaphysis. Approach: Anterior, ipsilateral approach. Area Prepped: Entire knee area, from mid-thigh to mid-shin, lateral, anterior, and medial aspects. Prepping solution: DuraPrep (Iodine Povacrylex [0.7% available iodine] and Isopropyl Alcohol, 74% w/w) Safety Precautions: Aspiration looking for blood return was conducted prior to all injections. At no point did we inject any substances, as a needle was being advanced. No attempts were made at seeking any paresthesias. Safe injection practices and needle disposal techniques used. Medications properly checked for expiration dates. SDV (single dose vial) medications used. Description of the Procedure: Protocol guidelines were followed. The patient was placed in position over the procedure table. The target area was identified and the area prepped in the usual manner. The skin and muscle were infiltrated with local anesthetic. Appropriate amount of time allowed to pass for local anesthetics to take effect. Radiofrequency needles were introduced to the target area using fluoroscopic guidance. Using the NeuroTherm NT1100 Radiofrequency Generator, sensory  stimulation using 50 Hz was used to locate & identify the nerve, making sure that the needle was positioned such that there was no sensory stimulation below 0.3 V or above 0.7 V. Stimulation using 2 Hz was used to evaluate the motor component. Care was taken not to lesion any nerves that demonstrated motor stimulation of the lower extremities at an output of less than 2.5 times that of the sensory threshold, or a maximum of 2.0 V. Once satisfactory placement of the needles was achieved, the numbing solution was slowly injected after negative aspiration. After waiting for at least 2 minutes, the ablation was performed at 80 degrees C for 60 seconds, using regular Radiofrequency settings. Once the procedure was completed, the needles were then removed and the area cleansed, making sure to leave some of the prepping solution back to take advantage of its long term bactericidal properties. Intra-operative Compliance: Compliant Vitals:   02/13/20 0913 02/13/20 0923 02/13/20 0934 02/13/20 0944  BP: (!) 176/82 (!) 144/65 (!) 154/77 (!) 151/73  Pulse:      Resp: 13 14 15 16   Temp:  (!) 97.2 F (36.2 C)  (!) 97.1 F (36.2 C)  TempSrc:      SpO2: 94% 98% 98% 98%  Weight:      Height:        Start Time: 0853 hrs. End Time:   hrs. Materials & Medications:  Needle(s) Type: Teflon-coated, curved tip, Radiofrequency needle(s) Gauge: 22G Length: 10cm Medication(s): Please see orders for medications and dosing details. 1 cc of 0.2% ropivacaine injected at each level above after sensorimotor testing prior to lesioning.   Imaging Guidance (Non-Spinal):          Type of Imaging Technique: Fluoroscopy Guidance (Non-Spinal) Indication(s): Assistance in needle guidance and placement for procedures requiring needle placement in or near specific anatomical locations not easily accessible without such assistance. Exposure Time: Please see nurses notes. Contrast: Before injecting any contrast, we confirmed that the  patient did not have an allergy to iodine, shellfish, or radiological contrast. Once satisfactory needle placement was completed at the desired level, radiological contrast was injected. Contrast injected under live fluoroscopy. No contrast complications. See chart for type and volume of contrast used. Fluoroscopic Guidance: I was personally present during the use of fluoroscopy. "Tunnel Vision Technique" used to obtain the best possible view of the target area. Parallax error corrected before commencing the procedure. "Direction-depth-direction" technique used to  introduce the needle under continuous pulsed fluoroscopy. Once target was reached, antero-posterior, oblique, and lateral fluoroscopic projection used confirm needle placement in all planes. Images permanently stored in EMR. Interpretation: I personally interpreted the imaging intraoperatively. Adequate needle placement confirmed in multiple planes. Appropriate spread of contrast into desired area was observed. No evidence of afferent or efferent intravascular uptake. Permanent images saved into the patient's record.  Antibiotic Prophylaxis:   Anti-infectives (From admission, onward)   None     Indication(s): None identified  Post-operative Assessment:  Post-procedure Vital Signs:  Pulse/HCG Rate: 6863 Temp: (!) 97.1 F (36.2 C) Resp: 16 BP: (!) 151/73 SpO2: 98 %  EBL: None  Complications: No immediate post-treatment complications observed by team, or reported by patient.  Note: The patient tolerated the entire procedure well. A repeat set of vitals were taken after the procedure and the patient was kept under observation following institutional policy, for this type of procedure. Post-procedural neurological assessment was performed, showing return to baseline, prior to discharge. The patient was provided with post-procedure discharge instructions, including a section on how to identify potential problems. Should any problems  arise concerning this procedure, the patient was given instructions to immediately contact us, at any time, without hesitation. In any case, we plan to contact the patient by telephone for a follow-up status report regarding this interventional procedure.  Comments:  No additional relevant information.  Plan of Care  Orders:  Orders Placed This Encounter  Procedures  . DG PAIN [facility] C-ARM 1-60 MIN NO REPORT    Intraoperative interpretation by procedural physician at [place].    Standing Status:   Standing    Number of Occurrences:   1    Order Specific Question:   Reason for exam:    Answer:   Assistance in needle guidance and placement for procedures requiring needle placement in or near specific anatomical locations not easily accessible without such assistance.    Medications ordered for procedure: Meds ordered this encounter  Medications  . lidocaine (XYLOCAINE) 2 % (with pres) injection 400 mg  . fentaNYL (SUBLIMAZE) injection 25-50 mcg    Make sure Narcan is available in the pyxis when using this medication. In the event of respiratory depression (RR< 8/min): Titrate NARCAN (naloxone) in increments of 0.1 to 0.2 mg IV at 2-3 minute intervals, until desired degree of reversal.  . ropivacaine (PF) 2 mg/mL (0.2%) (NAROPIN) injection 9 mL  . DISCONTD: dexamethasone (DECADRON) injection 10 mg   Medications administered: We administered lidocaine, fentaNYL, and ropivacaine (PF) 2 mg/mL (0.2%).  See the medical record for exact dosing, route, and time of administration.  Follow-up plan:   Return for Keep sch. appt.      s/p GNB (R) 07/18/19-helpful, provided greater than 50% pain relief for 5 days however patient did sustain a fall due to UTI associated sepsis did spend 3 days in the hospital.  Prior to her fall she was obtaining significant pain relief with genicular nerve block.  Status post right genicular RFA on 08/29/2019.  Left GN RFA on 02/12/19.     Recent  Visits Date Type Provider Dept  01/30/20 Office Visit [name], MD [facility]  11/26/19 Office Visit [name], MD [facility]  Showing recent visits within past 90 days and meeting all other requirements   Today's Visits Date Type Provider Dept  02/13/20 Procedure visit [name], MD [facility]  Showing today's visits and meeting all other requirements   Future  Appointments Date Type Provider Dept  04/29/20 Appointment [name], MD [facility]  Showing future appointments within next 90 days and meeting all other requirements   Disposition: Discharge home  Discharge Date & Time: 02/13/2020; 0945 hrs.   Primary Care Physician: Rusty Aus, MD Location: [facility] Note by: [name], MD Date: 02/13/2020; Time: 10:39 AM  Disclaimer:  Medicine is not an exact science. The only guarantee in medicine is that nothing is guaranteed. It is important to note that the decision to proceed with this intervention was based on the information collected from the patient. The Data and conclusions were drawn from the patient's questionnaire, the interview, and the physical examination. Because the information was provided in large part by the patient, it cannot be guaranteed that it has not been purposely or unconsciously manipulated. Every effort has been made to obtain as much relevant data as possible for this evaluation. It is important to note that the conclusions that lead to this procedure are derived in large part from the available data. Always take into account that the treatment will also be dependent on availability of resources and existing treatment guidelines, considered by other Pain Management Practitioners as being common knowledge and practice, at the time of the intervention. For Medico-Legal purposes, it is also important to point out that variation in procedural techniques and pharmacological  choices are the acceptable norm. The indications, contraindications, technique, and results of the above procedure should only be interpreted and judged by a Board-Certified Interventional Pain Specialist with extensive familiarity and expertise in the same exact procedure and technique.

## 2020-02-13 NOTE — Progress Notes (Signed)
Safety precautions to be maintained throughout the outpatient stay will include: orient to surroundings, keep bed in low position, maintain call bell within reach at all times, provide assistance with transfer out of bed and ambulation.  

## 2020-02-14 ENCOUNTER — Telehealth: Payer: Self-pay

## 2020-02-14 NOTE — Progress Notes (Deleted)
Brittney [name]  [phone] [phone]) [phone]  ID: WITTEN REGISTER OB: 1951-10-01  MR#: [id number]  [id number]  Patient Care Team: Rusty Aus, MD as PCP - General (Internal Medicine)  CHIEF COMPLAINT: Iron deficiency anemia.  INTERVAL HISTORY: Patient returns to clinic today for repeat laboratory for further evaluation.  She currently feels well and is asymptomatic.  She does not complain of weakness or fatigue today.  She has no neurologic complaints. She denies any recent fevers or illnesses. She has a good appetite and denies weight loss.  She denies any chest pain, shortness of breath, cough, or hemoptysis.  She has no nausea, vomiting, constipation, or diarrhea. She denies any melena or hematochezia. She has no urinary complaints.  Patient feels at her baseline offers no specific complaints today.  REVIEW OF SYSTEMS:   Review of Systems  Constitutional: Negative for fever, malaise/fatigue and weight loss.  Respiratory: Negative.  Negative for cough, hemoptysis and shortness of breath.   Cardiovascular: Negative.  Negative for chest pain and leg swelling.  Gastrointestinal: Negative.  Negative for abdominal pain, blood in stool and melena.  Genitourinary: Negative.  Negative for dysuria.  Musculoskeletal: Negative.  Negative for back pain.  Skin: Negative.  Negative for rash.  Neurological: Negative.  Negative for sensory change, focal weakness, weakness and headaches.  Psychiatric/Behavioral: Negative.  The patient is not nervous/anxious.     As per HPI. Otherwise, a complete review of systems is negative.  PAST MEDICAL HISTORY: Past Medical History:  Diagnosis Date  . Abdominal pain, left upper quadrant 12/11/2012  . Arthritis   . Asthma   . Broken leg 2014   fell twice and broke same leg. had a bone stimulator for first break.  . Cancer ([place]) 2007   melanoma, left ear and back of left leg, removed from back  . Chronic kidney disease,  stage 2 (mild)   . Chronic lower back pain   . Collagen vascular disease ([place])   . Coronary artery disease   . Diabetes mellitus without complication ([place])   . Diabetic nephropathy associated with secondary diabetes mellitus ([place])   . Flushing 12/11/2012  . Gastroparesis   . Gross hematuria 12/11/2012  . Hepatic cirrhosis ([place])   . Hyperlipidemia   . Hypertension   . Hypothyroidism   . Hypothyroidism   . IBS (irritable bowel syndrome)   . Kidney stone 12/11/2012  . Lower extremity edema   . Morbid obesity with BMI of 40.0-44.9, adult ([place])   . Myocardial infarction ([place]) 09/2013  . Nausea without vomiting 12/11/2012  . Nephrolithiasis   . Nephrolithiasis 04/26/2014  . Nonproliferative retinopathy due to secondary diabetes ([place])   . Numbness and tingling of right leg   . Peripheral vascular disease ([place])   . Renal colic 123456  . Renal insufficiency   . Sciatica   . Sciatica 12/11/2012  . Skin cancer 08/2018  . Sleep apnea    sleep study coming in november  . Unilateral small kidney without contralateral hypertrophy     PAST SURGICAL HISTORY: Past Surgical History:  Procedure Laterality Date  . ABDOMINAL HYSTERECTOMY    . BACK SURGERY  04/1999   L-4-5 LAMINECTOMY. metal in lower back and neck  . CESAREAN SECTION  1980  . CHOLECYSTECTOMY    . COLONOSCOPY  05/04/2001  . COLONOSCOPY WITH PROPOFOL N/A 09/20/2016   Procedure: COLONOSCOPY WITH PROPOFOL;  Surgeon: Lollie Sails, MD;  Location: [facility] ENDOSCOPY;  Service: Endoscopy;  Laterality: N/A;  .  DILATION AND CURETTAGE OF UTERUS    . ESOPHAGOGASTRODUODENOSCOPY  02/08/2014  . ESOPHAGOGASTRODUODENOSCOPY (EGD) WITH PROPOFOL N/A 09/20/2016   Procedure: ESOPHAGOGASTRODUODENOSCOPY (EGD) WITH PROPOFOL;  Surgeon: Lollie Sails, MD;  Location: [facility] ENDOSCOPY;  Service: Endoscopy;  Laterality: N/A;  . ESOPHAGOGASTRODUODENOSCOPY (EGD) WITH PROPOFOL N/A 05/23/2018   Procedure: ESOPHAGOGASTRODUODENOSCOPY (EGD) WITH PROPOFOL;   Surgeon: Lollie Sails, MD;  Location: [facility] ENDOSCOPY;  Service: Endoscopy;  Laterality: N/A;  . EYE SURGERY  1986   FOR MELANOMA  . HERNIA REPAIR    . JOINT REPLACEMENT Left 09/24/2013   TOTAL KNEE  . MOHS SURGERY  11/2016   left side of nose  . PULSE GENERATOR IMPLANT N/A 11/02/2017   Procedure: UNILATERAL PULSE GENERATOR IMPLANT;  Surgeon: Meade Maw, MD;  Location: [facility] ORS;  Service: Neurosurgery;  Laterality: N/A;  . TONSILLECTOMY     WITH ADENOIDECTOMY    FAMILY HISTORY: Family History  Problem Relation Age of Onset  . Heart disease Mother   . Cancer Mother   . Asthma Mother   . Diabetes Father   . Kidney disease Father   . Hypertension Father   . Breast cancer Neg Hx     ADVANCED DIRECTIVES (Y/N):  N  HEALTH MAINTENANCE: Social History   Tobacco Use  . Smoking status: Never Smoker  . Smokeless tobacco: Never Used  Substance Use Topics  . Alcohol use: No  . Drug use: No     Colonoscopy:  PAP:  Bone density:  Lipid panel:  Allergies  Allergen Reactions  . Eggs Or Egg-Derived Products Nausea And Vomiting    Only has reaction to flu vaccine due to eggs, no reaction to egg food products  . Shellfish Allergy Nausea And Vomiting, Diarrhea and Nausea Only    Non stop sickness once ingests shellfish. Betadine is okay  . Amlodipine Cough  . Codeine Nausea And Vomiting and Nausea Only    states she can take codeine cough syrup without problems states she can take codeine cough syrup without problems  . Imdur [Isosorbide Dinitrate] Other (See Comments)    Headache  . Lyrica [Pregabalin] Swelling and Other (See Comments)    "FLU-LIKE" SYMPTOMS  . Monosodium Glutamate Other (See Comments)    Headache  . Tape Other (See Comments) and Nausea And Vomiting    can use paper tape Redness    Current Outpatient Medications  Medication Sig Dispense Refill  . albuterol (VENTOLIN HFA) 108 (90 Base) MCG/ACT inhaler Inhale 2 puffs into the lungs 3 (three)  times daily as needed for wheezing or shortness of breath.     . ALPRAZolam (XANAX) 0.5 MG tablet Take 0.5 mg by mouth 4 (four) times daily.     . bumetanide (BUMEX) 1 MG tablet Take 1.5 mg by mouth daily.     . butalbital-acetaminophen-caffeine (FIORICET, ESGIC) 50-325-40 MG tablet Take 1 tablet by mouth 2 (two) times daily as needed for migraine.     . candesartan (ATACAND) 32 MG tablet Take 1 tablet (32 mg total) by mouth daily. 30 tablet 0  . carvedilol (COREG) 25 MG tablet Take 50 mg by mouth 2 (two) times daily with a meal.     . ciprofloxacin (CIPRO) 500 MG tablet     . cloNIDine (CATAPRES - DOSED IN MG/24 HR) 0.3 mg/24hr patch Place 0.3 mg onto the skin once a week.     . cyclobenzaprine (FLEXERIL) 10 MG tablet Take 20 mg by mouth at bedtime.    . diclofenac sodium (  VOLTAREN) 1 % GEL Apply 2 g topically daily as needed (pain).     Marland Kitchen doxazosin (CARDURA) 4 MG tablet Take 4 mg by mouth every evening.     . escitalopram (LEXAPRO) 10 MG tablet Take 10 mg by mouth daily.     Marland Kitchen FLOVENT HFA 220 MCG/ACT inhaler TAKE 2 PUFFS BY MOUTH TWICE A DAY 36 Inhaler 0  . fluticasone (FLONASE) 50 MCG/ACT nasal spray Place 2 sprays into both nostrils 2 (two) times daily.     . furosemide (LASIX) 40 MG tablet Take 40 mg by mouth daily.     Marland Kitchen gabapentin (NEURONTIN) 600 MG tablet Take 600-1,800 mg by mouth See admin instructions. Take 1 tablet (600mg ) by mouth twice daily and take 3 tablets (1800mg ) by mouth at bedtime    . hydrALAZINE (APRESOLINE) 50 MG tablet Take 50 mg by mouth 3 (three) times daily.     Marland Kitchen ibuprofen (ADVIL,MOTRIN) 800 MG tablet Take 800 mg by mouth 3 (three) times daily as needed for mild pain or moderate pain.     . Insulin Detemir (LEVEMIR FLEXTOUCH) 100 UNIT/ML Pen Inject 140 Units into the skin 2 (two) times daily.     . insulin regular human CONCENTRATED (HUMULIN R) 500 UNIT/ML kwikpen Inject 40-80 Units into the skin See admin instructions. Inject 80u under the skin at breakfast, inject  65-70u under the skin at lunch and inject 40-80u under the skin at dinner time based on blood glucose readings    . levothyroxine (SYNTHROID, LEVOTHROID) 150 MCG tablet Take 150 mcg by mouth daily before breakfast.   11  . LINZESS 290 MCG CAPS capsule Take 290 mcg by mouth daily.    . metoCLOPramide (REGLAN) 10 MG tablet Take 10 mg by mouth 3 (three) times daily.    . metolazone (ZAROXOLYN) 5 MG tablet Take 5 mg by mouth 2 (two) times a week.    . nitroGLYCERIN (NITRODUR - DOSED IN MG/24 HR) 0.4 mg/hr patch Place onto the skin.    Marland Kitchen ondansetron (ZOFRAN) 4 MG tablet Take 4 mg by mouth every 12 (twelve) hours as needed for nausea or vomiting.     Marland Kitchen oxyCODONE-acetaminophen (PERCOCET) 10-325 MG tablet Take 1 tablet by mouth every 12 (twelve) hours as needed for pain. Must last 30 days. 60 tablet 0  . [START ON 02/29/2020] oxyCODONE-acetaminophen (PERCOCET) 10-325 MG tablet Take 1 tablet by mouth every 12 (twelve) hours as needed for pain. Must last 30 days. 60 tablet 0  . [START ON 03/30/2020] oxyCODONE-acetaminophen (PERCOCET) 10-325 MG tablet Take 1 tablet by mouth every 12 (twelve) hours as needed for pain. Must last 30 days. 60 tablet 0  . pantoprazole (PROTONIX) 40 MG tablet Take 40 mg by mouth 2 (two) times daily.     . potassium chloride (K-DUR) 10 MEQ tablet Take 10 mEq by mouth daily.    . potassium chloride (KLOR-CON M10) 10 MEQ tablet Take by mouth.    . pramipexole (MIRAPEX) 0.5 MG tablet Take 0.5 mg by mouth 2 (two) times daily.     . rosuvastatin (CRESTOR) 20 MG tablet Take 20 mg by mouth daily.    . traMADol (ULTRAM) 50 MG tablet Take 100 mg by mouth at bedtime as needed.   3  . traZODone (DESYREL) 150 MG tablet Take 150 mg by mouth at bedtime.     Marland Kitchen zolpidem (AMBIEN) 10 MG tablet Take 10 mg by mouth at bedtime.      No current facility-administered medications for  this visit.    OBJECTIVE: There were no vitals filed for this visit.   There is no height or weight on file to calculate  BMI.    ECOG FS:0 - Asymptomatic  General: Well-developed, well-nourished, no acute distress. Eyes: Pink conjunctiva, anicteric sclera. HEENT: Normocephalic, moist mucous membranes. Lungs: Clear to auscultation bilaterally. Heart: Regular rate and rhythm. No rubs, murmurs, or gallops. Abdomen: Soft, nontender, nondistended. No organomegaly noted, normoactive bowel sounds. Musculoskeletal: No edema, cyanosis, or clubbing. Neuro: Alert, answering all questions appropriately. Cranial nerves grossly intact. Skin: No rashes or petechiae noted. Psych: Normal affect.  LAB RESULTS:  Lab Results  Component Value Date   NA 138 08/01/2019   K 3.9 08/01/2019   CL 101 08/01/2019   CO2 29 08/01/2019   GLUCOSE 198 (H) 08/01/2019   BUN 10 08/01/2019   CREATININE 0.64 08/01/2019   CALCIUM 9.6 08/01/2019   PROT 7.2 07/30/2019   ALBUMIN 3.3 (L) 07/30/2019   AST 39 07/30/2019   ALT 34 07/30/2019   ALKPHOS 125 07/30/2019   BILITOT 0.5 07/30/2019   GFRNONAA >60 08/01/2019   GFRAA >60 08/01/2019    Lab Results  Component Value Date   WBC 5.9 08/01/2019   NEUTROABS 6.7 07/30/2019   HGB 10.5 (L) 08/01/2019   HCT 34.0 (L) 08/01/2019   MCV 87.2 08/01/2019   PLT 144 (L) 08/01/2019   Lab Results  Component Value Date   IRON 91 07/24/2019   TIBC 292 07/24/2019   IRONPCTSAT 31 07/24/2019   Lab Results  Component Value Date   FERRITIN 27 07/24/2019     STUDIES: DG PAIN CLINIC C-ARM 1-60 MIN NO REPORT  Result Date: 02/13/2020 Fluoro was used, but no Radiologist interpretation will be provided. Please refer to "NOTES" tab for provider progress note.   ASSESSMENT: Iron deficiency anemia.  PLAN:    1. Iron deficiency anemia: Patient's hemoglobin remains mildly decreased, but essentially stable at 10.8.  Her iron stores continue to be within normal limits.  Previously, the remainder of her laboratory work was also either negative or within normal limits.  Colonoscopy and EGD completed on  September 20, 2016 were essentially normal other than some mild gastritis. She does not require additional Feraheme today. Patient last received treatment on July 13, 2017.  After discussion with the patient, it was decided that no further follow-up is necessary.  Please refer patient back if there are any questions or concerns. 2. Hypertension: Patient's blood pressure is moderately elevated today. Continue evaluation and treatment per primary care. 3. Back pain: Patient does not complain of this today.  Patient has a permanent spinal stimulator in place.  Patient expressed understanding and was in agreement with this plan. She also understands that She can call clinic at any time with any questions, concerns, or complaints.    Lloyd Huger, MD   02/14/2020 2:53 PM

## 2020-02-15 ENCOUNTER — Inpatient Hospital Stay: Payer: Medicare Other | Admitting: Oncology

## 2020-02-21 ENCOUNTER — Inpatient Hospital Stay: Payer: Medicare Other | Admitting: Oncology

## 2020-02-22 ENCOUNTER — Other Ambulatory Visit: Payer: Self-pay

## 2020-02-22 ENCOUNTER — Inpatient Hospital Stay: Payer: Medicare Other

## 2020-02-22 ENCOUNTER — Encounter: Payer: Self-pay | Admitting: Oncology

## 2020-02-22 ENCOUNTER — Inpatient Hospital Stay: Payer: Medicare Other | Attending: Oncology | Admitting: Oncology

## 2020-02-22 VITALS — BP 131/50 | HR 75 | Temp 97.5°F | Resp 20 | Wt 330.0 lb

## 2020-02-22 DIAGNOSIS — D649 Anemia, unspecified: Secondary | ICD-10-CM

## 2020-02-22 DIAGNOSIS — I129 Hypertensive chronic kidney disease with stage 1 through stage 4 chronic kidney disease, or unspecified chronic kidney disease: Secondary | ICD-10-CM | POA: Diagnosis not present

## 2020-02-22 DIAGNOSIS — D509 Iron deficiency anemia, unspecified: Secondary | ICD-10-CM | POA: Diagnosis not present

## 2020-02-22 DIAGNOSIS — E1122 Type 2 diabetes mellitus with diabetic chronic kidney disease: Secondary | ICD-10-CM | POA: Diagnosis not present

## 2020-02-22 DIAGNOSIS — Z79899 Other long term (current) drug therapy: Secondary | ICD-10-CM | POA: Diagnosis not present

## 2020-02-22 DIAGNOSIS — E039 Hypothyroidism, unspecified: Secondary | ICD-10-CM | POA: Insufficient documentation

## 2020-02-22 DIAGNOSIS — Z794 Long term (current) use of insulin: Secondary | ICD-10-CM | POA: Insufficient documentation

## 2020-02-22 LAB — CBC
HCT: 35.6 % — ABNORMAL LOW (ref 36.0–46.0)
Hemoglobin: 10.6 g/dL — ABNORMAL LOW (ref 12.0–15.0)
MCH: 24.5 pg — ABNORMAL LOW (ref 26.0–34.0)
MCHC: 29.8 g/dL — ABNORMAL LOW (ref 30.0–36.0)
MCV: 82.4 fL (ref 80.0–100.0)
Platelets: 192 10*3/uL (ref 150–400)
RBC: 4.32 MIL/uL (ref 3.87–5.11)
RDW: 15.8 % — ABNORMAL HIGH (ref 11.5–15.5)
WBC: 8.5 10*3/uL (ref 4.0–10.5)
nRBC: 0 % (ref 0.0–0.2)

## 2020-02-22 LAB — RETICULOCYTES
Immature Retic Fract: 15 % (ref 2.3–15.9)
RBC.: 4.24 MIL/uL (ref 3.87–5.11)
Retic Count, Absolute: 50.9 10*3/uL (ref 19.0–186.0)
Retic Ct Pct: 1.2 % (ref 0.4–3.1)

## 2020-02-22 LAB — IRON AND TIBC
Iron: 32 ug/dL (ref 28–170)
Saturation Ratios: 9 % — ABNORMAL LOW (ref 10.4–31.8)
TIBC: 374 ug/dL (ref 250–450)
UIBC: 342 ug/dL

## 2020-02-22 LAB — VITAMIN B12: Vitamin B-12: 470 pg/mL (ref 180–914)

## 2020-02-22 LAB — DAT, POLYSPECIFIC AHG (ARMC ONLY): Polyspecific AHG test: NEGATIVE

## 2020-02-22 LAB — FOLATE: Folate: 11.9 ng/mL (ref >5.9)

## 2020-02-22 LAB — FERRITIN: Ferritin: 18 ng/mL (ref 11–307)

## 2020-02-22 LAB — LACTATE DEHYDROGENASE: LDH: 136 U/L (ref 98–192)

## 2020-02-22 NOTE — Progress Notes (Signed)
[place]  [phone]) [phone] [phone]  ID: Brittney [name] OB: [birth year]  MR#: [id number]  [id number]  Patient Care Team: Rusty Aus, MD as PCP - General (Internal Medicine)  CHIEF COMPLAINT: Iron deficiency anemia.  INTERVAL HISTORY: Patient last evaluated in July 2020.  She is referred back for declining hemoglobin.  She continues to have significant and chronic pain.  She has worsening weakness and fatigue.  She also reports that she is persistently cold and has noticed her hair is thinning. She has no neurologic complaints. She denies any recent fevers or illnesses. She has a good appetite and denies weight loss.  She denies any chest pain, shortness of breath, cough, or hemoptysis.  She has no nausea, vomiting, constipation, or diarrhea. She denies any melena or hematochezia. She has no urinary complaints.  Patient offers no further specific complaints today.  REVIEW OF SYSTEMS:   Review of Systems  Constitutional: Positive for malaise/fatigue. Negative for fever and weight loss.  Respiratory: Negative.  Negative for cough, hemoptysis and shortness of breath.   Cardiovascular: Negative.  Negative for chest pain and leg swelling.  Gastrointestinal: Negative.  Negative for abdominal pain, blood in stool and melena.  Genitourinary: Negative.  Negative for dysuria.  Musculoskeletal: Negative.  Negative for back pain.  Skin: Negative.  Negative for rash.  Neurological: Positive for sensory change and weakness. Negative for focal weakness and headaches.  Psychiatric/Behavioral: Negative.  The patient is not nervous/anxious.     As per HPI. Otherwise, a complete review of systems is negative.  PAST MEDICAL HISTORY: Past Medical History:  Diagnosis Date  . Abdominal pain, left upper quadrant 12/11/2012  . Arthritis   . Asthma   . Broken leg 2014   fell twice and broke same leg. had a bone stimulator for first break.  . Cancer ([place]) 2007    melanoma, left ear and back of left leg, removed from back  . Chronic kidney disease, stage 2 (mild)   . Chronic lower back pain   . Collagen vascular disease ([place])   . Coronary artery disease   . Diabetes mellitus without complication ([place])   . Diabetic nephropathy associated with secondary diabetes mellitus ([place])   . [place] 12/11/2012  . Gastroparesis   . Gross hematuria 12/11/2012  . Hepatic cirrhosis ([place])   . Hyperlipidemia   . Hypertension   . Hypothyroidism   . Hypothyroidism   . IBS (irritable bowel syndrome)   . Kidney stone 12/11/2012  . Lower extremity edema   . Morbid obesity with BMI of 40.0-44.9, adult ([place])   . Myocardial infarction ([place]) 09/2013  . Nausea without vomiting 12/11/2012  . Nephrolithiasis   . Nephrolithiasis 04/26/2014  . Nonproliferative retinopathy due to secondary diabetes ([place])   . Numbness and tingling of right leg   . Peripheral vascular disease ([place])   . Renal colic 123456  . Renal insufficiency   . Sciatica   . Sciatica 12/11/2012  . Skin cancer 08/2018  . Sleep apnea    sleep study coming in november  . Unilateral small kidney without contralateral hypertrophy     PAST SURGICAL HISTORY: Past Surgical History:  Procedure Laterality Date  . ABDOMINAL HYSTERECTOMY    . BACK SURGERY  04/1999   L-4-5 LAMINECTOMY. metal in lower back and neck  . CESAREAN SECTION  1980  . CHOLECYSTECTOMY    . COLONOSCOPY  05/04/2001  . COLONOSCOPY WITH PROPOFOL N/A 09/20/2016   Procedure: COLONOSCOPY WITH PROPOFOL;  Surgeon: Lollie Sails, MD;  Location: [facility] ENDOSCOPY;  Service: Endoscopy;  Laterality: N/A;  . DILATION AND CURETTAGE OF UTERUS    . ESOPHAGOGASTRODUODENOSCOPY  02/08/2014  . ESOPHAGOGASTRODUODENOSCOPY (EGD) WITH PROPOFOL N/A 09/20/2016   Procedure: ESOPHAGOGASTRODUODENOSCOPY (EGD) WITH PROPOFOL;  Surgeon: Lollie Sails, MD;  Location[facility]COPY;  Service: Endoscopy;  Laterality: N/A;  . ESOPHAGOGASTRODUODENOSCOPY (EGD)  WITH PROPOFOL N/A 05/23/2018   Procedure: ESOPHAGOGASTRODUODENOSCOPY (EGD) WITH PROPOFOL;  Surgeon: Lollie Sails, MD;  Location[facility] ENDOSCOPY;  Service: Endoscopy;  Laterality: N/A;  . EYE SURGERY  1986   FOR MELANOMA  . HERNIA REPAIR    . JOINT REPLACEMENT Left 09/24/2013   TOTAL KNEE  . MOHS SURGERY  11/2016   left side of nose  . PULSE GENERATOR IMPLANT N/A 11/02/2017   Procedure: UNILATERAL PULSE GENERATOR IMPLANT;  Surgeon: Meade Maw, MD;  Location: [facility] ORS;  Service: Neurosurgery;  Laterality: N/A;  . TONSILLECTOMY     WITH ADENOIDECTOMY    FAMILY HISTORY: Family History  Problem Relation Age of Onset  . Heart disease Mother   . Cancer Mother   . Asthma Mother   . Diabetes Father   . Kidney disease Father   . Hypertension Father   . Breast cancer Neg Hx     ADVANCED DIRECTIVES (Y/N):  N  HEALTH MAINTENANCE: Social History   Tobacco Use  . Smoking status: Never Smoker  . Smokeless tobacco: Never Used  Substance Use Topics  . Alcohol use: No  . Drug use: No     Colonoscopy:  PAP:  Bone density:  Lipid panel:  Allergies  Allergen Reactions  . Eggs Or Egg-Derived Products Nausea And Vomiting    Only has reaction to flu vaccine due to eggs, no reaction to egg food products  . Shellfish Allergy Nausea And Vomiting, Diarrhea and Nausea Only    Non stop sickness once ingests shellfish. Betadine is okay  . Amlodipine Cough  . Codeine Nausea And Vomiting and Nausea Only    states she can take codeine cough syrup without problems states she can take codeine cough syrup without problems  . Imdur [Isosorbide Dinitrate] Other (See Comments)    Headache  . Lyrica [Pregabalin] Swelling and Other (See Comments)    "FLU-LIKE" SYMPTOMS  . Monosodium Glutamate Other (See Comments)    Headache  . Tape Other (See Comments) and Nausea And Vomiting    can use paper tape Redness    Current Outpatient Medications  Medication Sig Dispense Refill  .  albuterol (VENTOLIN HFA) 108 (90 Base) MCG/ACT inhaler Inhale 2 puffs into the lungs 3 (three) times daily as needed for wheezing or shortness of breath.     . ALPRAZolam (XANAX) 0.5 MG tablet Take 0.5 mg by mouth 4 (four) times daily.     . butalbital-acetaminophen-caffeine (FIORICET, ESGIC) 50-325-40 MG tablet Take 1 tablet by mouth 2 (two) times daily as needed for migraine.     . candesartan (ATACAND) 32 MG tablet Take 1 tablet (32 mg total) by mouth daily. 30 tablet 0  . carvedilol (COREG) 25 MG tablet Take 50 mg by mouth 2 (two) times daily with a meal.     . cloNIDine (CATAPRES - DOSED IN MG/24 HR) 0.3 mg/24hr patch Place 0.3 mg onto the skin once a week.     . cyclobenzaprine (FLEXERIL) 10 MG tablet Take 20 mg by mouth at bedtime.    . diclofenac sodium (VOLTAREN) 1 % GEL Apply 2 g topically daily  as needed (pain).     Marland Kitchen doxazosin (CARDURA) 4 MG tablet Take 4 mg by mouth every evening.     . escitalopram (LEXAPRO) 10 MG tablet Take 10 mg by mouth daily.     Marland Kitchen FLOVENT HFA 220 MCG/ACT inhaler TAKE 2 PUFFS BY MOUTH TWICE A DAY 36 Inhaler 0  . fluticasone (FLONASE) 50 MCG/ACT nasal spray Place 2 sprays into both nostrils 2 (two) times daily.     . furosemide (LASIX) 40 MG tablet Take 40 mg by mouth daily.     Marland Kitchen gabapentin (NEURONTIN) 600 MG tablet Take 600-1,800 mg by mouth See admin instructions. Take 1 tablet (600mg ) by mouth twice daily and take 3 tablets (1800mg ) by mouth at bedtime    . hydrALAZINE (APRESOLINE) 50 MG tablet Take 50 mg by mouth 3 (three) times daily.     Marland Kitchen ibuprofen (ADVIL,MOTRIN) 800 MG tablet Take 800 mg by mouth 3 (three) times daily as needed for mild pain or moderate pain.     . Insulin Human (INSULIN PUMP) SOLN Inject into the skin.    Marland Kitchen insulin regular human CONCENTRATED (HUMULIN R) 500 UNIT/ML kwikpen Inject 40-80 Units into the skin See admin instructions. Inject 80u under the skin at breakfast, inject 65-70u under the skin at lunch and inject 40-80u under the skin  at dinner time based on blood glucose readings    . levothyroxine (SYNTHROID, LEVOTHROID) 150 MCG tablet Take 150 mcg by mouth daily before breakfast.   11  . LINZESS 290 MCG CAPS capsule Take 290 mcg by mouth daily.    . metoCLOPramide (REGLAN) 10 MG tablet Take 10 mg by mouth 3 (three) times daily.    . nitroGLYCERIN (NITRODUR - DOSED IN MG/24 HR) 0.4 mg/hr patch Place onto the skin.    Marland Kitchen ondansetron (ZOFRAN) 4 MG tablet Take 4 mg by mouth every 12 (twelve) hours as needed for nausea or vomiting.     Marland Kitchen oxyCODONE-acetaminophen (PERCOCET) 10-325 MG tablet Take 1 tablet by mouth every 12 (twelve) hours as needed for pain. Must last 30 days. 60 tablet 0  . [START ON 02/29/2020] oxyCODONE-acetaminophen (PERCOCET) 10-325 MG tablet Take 1 tablet by mouth every 12 (twelve) hours as needed for pain. Must last 30 days. 60 tablet 0  . [START ON 03/30/2020] oxyCODONE-acetaminophen (PERCOCET) 10-325 MG tablet Take 1 tablet by mouth every 12 (twelve) hours as needed for pain. Must last 30 days. 60 tablet 0  . pantoprazole (PROTONIX) 40 MG tablet Take 40 mg by mouth 2 (two) times daily.     . potassium chloride (K-DUR) 10 MEQ tablet Take 10 mEq by mouth daily.    . potassium chloride (KLOR-CON M10) 10 MEQ tablet Take by mouth.    . pramipexole (MIRAPEX) 0.5 MG tablet Take 0.5 mg by mouth 2 (two) times daily.     . rosuvastatin (CRESTOR) 20 MG tablet Take 20 mg by mouth daily.    . traMADol (ULTRAM) 50 MG tablet Take 100 mg by mouth at bedtime as needed.   3  . traZODone (DESYREL) 150 MG tablet Take 150 mg by mouth at bedtime.     Marland Kitchen zolpidem (AMBIEN) 10 MG tablet Take 10 mg by mouth at bedtime.     . bumetanide (BUMEX) 1 MG tablet Take 1.5 mg by mouth daily.     . ciprofloxacin (CIPRO) 500 MG tablet     . Insulin Detemir (LEVEMIR FLEXTOUCH) 100 UNIT/ML Pen Inject 140 Units into the skin 2 (two) times  daily.     . metolazone (ZAROXOLYN) 5 MG tablet Take 5 mg by mouth 2 (two) times a week.     No current  facility-administered medications for this visit.    OBJECTIVE: Vitals:   02/22/20 1055  BP: (!) 131/50  Pulse: 75  Resp: 20  Temp: (!) 97.5 F (36.4 C)  SpO2: 100%     Body mass index is 53.26 kg/m.    ECOG FS:2 - Symptomatic, <50% confined to bed  General: Well-developed, well-nourished, no acute distress.  Sitting in a wheelchair. Eyes: Pink conjunctiva, anicteric sclera. HEENT: Normocephalic, moist mucous membranes. Lungs: No audible wheezing or coughing. Heart: Regular rate and rhythm. Abdomen: Soft, nontender, no obvious distention. Musculoskeletal: No edema, cyanosis, or clubbing. Neuro: Alert, answering all questions appropriately. Cranial nerves grossly intact. Skin: No rashes or petechiae noted. Psych: Normal affect.  LAB RESULTS:  Lab Results  Component Value Date   NA 138 08/01/2019   K 3.9 08/01/2019   CL 101 08/01/2019   CO2 29 08/01/2019   GLUCOSE 198 (H) 08/01/2019   BUN 10 08/01/2019   CREATININE 0.64 08/01/2019   CALCIUM 9.6 08/01/2019   PROT 7.2 07/30/2019   ALBUMIN 3.3 (L) 07/30/2019   AST 39 07/30/2019   ALT 34 07/30/2019   ALKPHOS 125 07/30/2019   BILITOT 0.5 07/30/2019   GFRNONAA >60 08/01/2019   GFRAA >60 08/01/2019    Lab Results  Component Value Date   WBC 8.5 02/22/2020   NEUTROABS 6.7 07/30/2019   HGB 10.6 (L) 02/22/2020   HCT 35.6 (L) 02/22/2020   MCV 82.4 02/22/2020   PLT 192 02/22/2020   Lab Results  Component Value Date   IRON 32 02/22/2020   TIBC 374 02/22/2020   IRONPCTSAT 9 (L) 02/22/2020   Lab Results  Component Value Date   FERRITIN 18 02/22/2020     STUDIES: DG PAIN CLINIC C-ARM 1-60 MIN NO REPORT  Result Date: 02/13/2020 Fluoro was used, but no Radiologist interpretation will be provided. Please refer to "NOTES" tab for provider progress note.   ASSESSMENT: Iron deficiency anemia.  PLAN:    1. Iron deficiency anemia:  Colonoscopy and EGD completed on September 20, 2016 were essentially normal other  than some mild gastritis. Although patient's hemoglobin is essentially stable at 10.6, she has a decreased iron saturation ratio and borderline low total iron and ferritin.  She has no evidence of hemolysis and her folate levels are within normal limits.  The remainder of her blood work is pending at time of dictation.  Return to clinic in 1 and 2 weeks for IV Feraheme.  Patient will then return to clinic in 2 months for repeat laboratory work, further evaluation, and consideration of additional treatment.   2.  Back pain: Patient has permanent spinal stimulator in place. 3.  Diabetes: Patient now has insulin pump. 4.  Persistently cold/hair thinning: Thyroid panel with TSH is pending at time of dictation.  Continue current dose of Synthroid.  I spent a total of 30 minutes reviewing chart data, face-to-face evaluation with the patient, counseling and coordination of care as detailed above.   Patient expressed understanding and was in agreement with this plan. She also understands that She can call clinic at any time with any questions, concerns, or complaints.    Lloyd Huger, MD   02/22/2020 5:18 PM

## 2020-02-22 NOTE — Progress Notes (Signed)
Dr. Sabra Heck would like for patient to f/u with [facility] due to hemoglobin trending down.

## 2020-02-23 LAB — THYROID PANEL WITH TSH
Free Thyroxine Index: 1.8 (ref 1.2–4.9)
T3 Uptake Ratio: 22 % — ABNORMAL LOW (ref 24–39)
T4, Total: 8.4 ug/dL (ref 4.5–12.0)
TSH: 8.14 u[IU]/mL — ABNORMAL HIGH (ref 0.450–4.500)

## 2020-02-23 LAB — ERYTHROPOIETIN: Erythropoietin: 17.8 m[IU]/mL (ref 2.6–18.5)

## 2020-02-23 LAB — HAPTOGLOBIN: Haptoglobin: 174 mg/dL (ref 37–355)

## 2020-02-25 LAB — PROTEIN ELECTROPHORESIS, SERUM
A/G Ratio: 0.9 (ref 0.7–1.7)
Albumin ELP: 3.2 g/dL (ref 2.9–4.4)
Alpha-1-Globulin: 0.2 g/dL (ref 0.0–0.4)
Alpha-2-Globulin: 0.7 g/dL (ref 0.4–1.0)
Beta Globulin: 1.5 g/dL — ABNORMAL HIGH (ref 0.7–1.3)
Gamma Globulin: 0.9 g/dL (ref 0.4–1.8)
Globulin, Total: 3.4 g/dL (ref 2.2–3.9)
Total Protein ELP: 6.6 g/dL (ref 6.0–8.5)

## 2020-03-03 ENCOUNTER — Other Ambulatory Visit: Payer: Self-pay

## 2020-03-03 ENCOUNTER — Inpatient Hospital Stay: Payer: Medicare Other | Attending: Oncology

## 2020-03-03 VITALS — BP 134/72 | HR 61 | Temp 98.6°F | Resp 16

## 2020-03-03 DIAGNOSIS — D509 Iron deficiency anemia, unspecified: Secondary | ICD-10-CM | POA: Insufficient documentation

## 2020-03-03 DIAGNOSIS — N189 Chronic kidney disease, unspecified: Secondary | ICD-10-CM | POA: Insufficient documentation

## 2020-03-03 MED ORDER — SODIUM CHLORIDE 0.9 % IV SOLN
Freq: Once | INTRAVENOUS | Status: AC
  Filled 2020-03-03: qty 250

## 2020-03-03 MED ORDER — SODIUM CHLORIDE 0.9 % IV SOLN
510.0000 mg | Freq: Once | INTRAVENOUS | Status: AC
  Administered 2020-03-03: 14:00:00 510 mg via INTRAVENOUS
  Filled 2020-03-03: qty 510

## 2020-03-09 ENCOUNTER — Other Ambulatory Visit: Payer: Self-pay | Admitting: Internal Medicine

## 2020-03-10 ENCOUNTER — Other Ambulatory Visit: Payer: Self-pay

## 2020-03-10 ENCOUNTER — Inpatient Hospital Stay: Payer: Medicare Other

## 2020-03-10 VITALS — BP 152/75 | HR 67 | Resp 20

## 2020-03-10 DIAGNOSIS — D509 Iron deficiency anemia, unspecified: Secondary | ICD-10-CM | POA: Diagnosis not present

## 2020-03-10 MED ORDER — SODIUM CHLORIDE 0.9 % IV SOLN
INTRAVENOUS | Status: DC
  Filled 2020-03-10: qty 250

## 2020-03-10 MED ORDER — SODIUM CHLORIDE 0.9 % IV SOLN
510.0000 mg | Freq: Once | INTRAVENOUS | Status: AC
  Administered 2020-03-10: 510 mg via INTRAVENOUS
  Filled 2020-03-10: qty 510

## 2020-03-30 ENCOUNTER — Other Ambulatory Visit: Payer: Self-pay | Admitting: Student in an Organized Health Care Education/Training Program

## 2020-03-30 DIAGNOSIS — G894 Chronic pain syndrome: Secondary | ICD-10-CM

## 2020-04-15 ENCOUNTER — Other Ambulatory Visit: Payer: Self-pay | Admitting: Gastroenterology

## 2020-04-15 ENCOUNTER — Other Ambulatory Visit (HOSPITAL_COMMUNITY): Payer: Self-pay | Admitting: Gastroenterology

## 2020-04-15 DIAGNOSIS — K7469 Other cirrhosis of liver: Secondary | ICD-10-CM

## 2020-04-21 ENCOUNTER — Ambulatory Visit: Admission: RE | Admit: 2020-04-21 | Payer: Medicare Other | Source: Ambulatory Visit

## 2020-04-25 ENCOUNTER — Other Ambulatory Visit: Payer: Self-pay

## 2020-04-25 ENCOUNTER — Encounter: Payer: Self-pay | Admitting: Oncology

## 2020-04-25 ENCOUNTER — Ambulatory Visit
Admission: RE | Admit: 2020-04-25 | Discharge: 2020-04-25 | Disposition: A | Discharge status: Home / Self Care | Payer: Medicare Other | Source: Ambulatory Visit | Attending: Gastroenterology | Admitting: Gastroenterology

## 2020-04-25 ENCOUNTER — Other Ambulatory Visit: Payer: Medicare Other

## 2020-04-25 DIAGNOSIS — K7469 Other cirrhosis of liver: Secondary | ICD-10-CM | POA: Diagnosis not present

## 2020-04-26 NOTE — Progress Notes (Signed)
[place]  [phone] [phone]  ID: Brittney [name] OB: 28-Sep-1951  MR#: [id number]  [id number]  Patient Care Team: Rusty Aus, MD as PCP - General (Internal Medicine) Lloyd Huger, MD as Consulting Physician (Hematology and Oncology)  CHIEF COMPLAINT: Iron deficiency anemia.  INTERVAL HISTORY: Patient returns to clinic today for repeat laboratory work and further evaluation.  She continues to have weakness and fatigue, but admits this has improved since receiving IV iron several months ago.  She has no neurologic complaints. She denies any recent fevers or illnesses. She has a good appetite and denies weight loss.  She denies any chest pain, shortness of breath, cough, or hemoptysis.  She has no nausea, vomiting, constipation, or diarrhea. She denies any melena or hematochezia. She has no urinary complaints.  Patient offers no further specific complaints today.  REVIEW OF SYSTEMS:   Review of Systems  Constitutional: Positive for malaise/fatigue. Negative for fever and weight loss.  Respiratory: Negative.  Negative for cough, hemoptysis and shortness of breath.   Cardiovascular: Negative.  Negative for chest pain and leg swelling.  Gastrointestinal: Negative.  Negative for abdominal pain, blood in stool and melena.  Genitourinary: Negative.  Negative for dysuria.  Musculoskeletal: Negative.  Negative for back pain.  Skin: Negative.  Negative for rash.  Neurological: Positive for weakness. Negative for sensory change, focal weakness and headaches.  Psychiatric/Behavioral: Negative.  The patient is not nervous/anxious.     As per HPI. Otherwise, a complete review of systems is negative.  PAST MEDICAL HISTORY: Past Medical History:  Diagnosis Date  . Abdominal pain, left upper quadrant 12/11/2012  . Arthritis   . Asthma   . Broken leg 2014   fell twice and broke same leg. had a bone stimulator for first break.  . Cancer  ([place]) 2007   melanoma, left ear and back of left leg, removed from back  . Chronic kidney disease, stage 2 (mild)   . Chronic lower back pain   . Collagen vascular disease ([place])   . Coronary artery disease   . Diabetes mellitus without complication ([place])   . Diabetic nephropathy associated with secondary diabetes mellitus ([place])   . [place] 12/11/2012  . Gastroparesis   . Gross hematuria 12/11/2012  . Hepatic cirrhosis ([place])   . Hyperlipidemia   . Hypertension   . Hypothyroidism   . Hypothyroidism   . IBS (irritable bowel syndrome)   . Kidney stone 12/11/2012  . Lower extremity edema   . Morbid obesity with BMI of 40.0-44.9, adult ([place])   . Myocardial infarction ([place]) 09/2013  . Nausea without vomiting 12/11/2012  . Nephrolithiasis   . Nephrolithiasis 04/26/2014  . Nonproliferative retinopathy due to secondary diabetes ([place])   . Numbness and tingling of right leg   . Peripheral vascular disease ([place])   . Renal colic 123456  . Renal insufficiency   . Sciatica   . Sciatica 12/11/2012  . Skin cancer 08/2018  . Sleep apnea    sleep study coming in november  . Unilateral small kidney without contralateral hypertrophy     PAST SURGICAL HISTORY: Past Surgical History:  Procedure Laterality Date  . ABDOMINAL HYSTERECTOMY    . BACK SURGERY  04/1999   L-4-5 LAMINECTOMY. metal in lower back and neck  . CESAREAN SECTION  1980  . CHOLECYSTECTOMY    . COLONOSCOPY  05/04/2001  . COLONOSCOPY WITH PROPOFOL N/A 09/20/2016   Procedure: COLONOSCOPY WITH PROPOFOL;  Surgeon: Lollie Sails,  MD;  Location: [facility] ENDOSCOPY;  Service: Endoscopy;  Laterality: N/A;  . DILATION AND CURETTAGE OF UTERUS    . ESOPHAGOGASTRODUODENOSCOPY  02/08/2014  . ESOPHAGOGASTRODUODENOSCOPY (EGD) WITH PROPOFOL N/A 09/20/2016   Procedure: ESOPHAGOGASTRODUODENOSCOPY (EGD) WITH PROPOFOL;  Surgeon: Lollie Sails, MD;  Location: [facility] ENDOSCOPY;  Service: Endoscopy;  Laterality: N/A;  .  ESOPHAGOGASTRODUODENOSCOPY (EGD) WITH PROPOFOL N/A 05/23/2018   Procedure: ESOPHAGOGASTRODUODENOSCOPY (EGD) WITH PROPOFOL;  Surgeon: Lollie Sails, MD;  Location: [facility] ENDOSCOPY;  Service: Endoscopy;  Laterality: N/A;  . EYE SURGERY  1986   FOR MELANOMA  . HERNIA REPAIR    . JOINT REPLACEMENT Left 09/24/2013   TOTAL KNEE  . MOHS SURGERY  11/2016   left side of nose  . PULSE GENERATOR IMPLANT N/A 11/02/2017   Procedure: UNILATERAL PULSE GENERATOR IMPLANT;  Surgeon: Meade Maw, MD;  Location: [facility] ORS;  Service: Neurosurgery;  Laterality: N/A;  . TONSILLECTOMY     WITH ADENOIDECTOMY    FAMILY HISTORY: Family History  Problem Relation Age of Onset  . Heart disease Mother   . Cancer Mother   . Asthma Mother   . Diabetes Father   . Kidney disease Father   . Hypertension Father   . Breast cancer Neg Hx     ADVANCED DIRECTIVES (Y/N):  N  HEALTH MAINTENANCE: Social History   Tobacco Use  . Smoking status: Never Smoker  . Smokeless tobacco: Never Used  Substance Use Topics  . Alcohol use: No  . Drug use: No     Colonoscopy:  PAP:  Bone density:  Lipid panel:  Allergies  Allergen Reactions  . Eggs Or Egg-Derived Products Nausea And Vomiting    Only has reaction to flu vaccine due to eggs, no reaction to egg food products  . Shellfish Allergy Nausea And Vomiting, Diarrhea and Nausea Only    Non stop sickness once ingests shellfish. Betadine is okay  . Amlodipine Cough  . Codeine Nausea And Vomiting and Nausea Only    states she can take codeine cough syrup without problems states she can take codeine cough syrup without problems  . Imdur [Isosorbide Dinitrate] Other (See Comments)    Headache  . Lyrica [Pregabalin] Swelling and Other (See Comments)    "FLU-LIKE" SYMPTOMS  . Monosodium Glutamate Other (See Comments)    Headache  . Tape Other (See Comments) and Nausea And Vomiting    can use paper tape Redness    Current Outpatient Medications   Medication Sig Dispense Refill  . albuterol (VENTOLIN HFA) 108 (90 Base) MCG/ACT inhaler Inhale 2 puffs into the lungs 3 (three) times daily as needed for wheezing or shortness of breath.     . ALPRAZolam (XANAX) 0.5 MG tablet Take 0.5 mg by mouth 4 (four) times daily.     . butalbital-acetaminophen-caffeine (FIORICET, ESGIC) 50-325-40 MG tablet Take 1 tablet by mouth 2 (two) times daily as needed for migraine.     . carvedilol (COREG) 25 MG tablet Take 50 mg by mouth 2 (two) times daily with a meal.     . cloNIDine (CATAPRES - DOSED IN MG/24 HR) 0.3 mg/24hr patch Place 0.3 mg onto the skin once a week.     . cyclobenzaprine (FLEXERIL) 10 MG tablet Take 20 mg by mouth at bedtime.    . diclofenac sodium (VOLTAREN) 1 % GEL Apply 2 g topically daily as needed (pain).     Marland Kitchen doxazosin (CARDURA) 4 MG tablet Take 4 mg by mouth every evening.     Marland Kitchen  escitalopram (LEXAPRO) 10 MG tablet Take 10 mg by mouth daily.     Marland Kitchen FLOVENT HFA 220 MCG/ACT inhaler TAKE 2 PUFFS BY MOUTH TWICE A DAY (Patient taking differently: as needed. ) 36 Inhaler 0  . fluticasone (FLONASE) 50 MCG/ACT nasal spray Place 2 sprays into both nostrils as needed.     . furosemide (LASIX) 40 MG tablet Take 40 mg by mouth daily.     Marland Kitchen gabapentin (NEURONTIN) 600 MG tablet Take 600-1,800 mg by mouth See admin instructions. Take 1 tablet (600mg ) by mouth twice daily and take 3 tablets (1800mg ) by mouth at bedtime    . hydrALAZINE (APRESOLINE) 50 MG tablet Take 50 mg by mouth 3 (three) times daily.     Marland Kitchen ibuprofen (ADVIL,MOTRIN) 800 MG tablet Take 800 mg by mouth 3 (three) times daily as needed for mild pain or moderate pain.     . Insulin Detemir (LEVEMIR FLEXTOUCH) 100 UNIT/ML Pen Inject 140 Units into the skin 2 (two) times daily.     . Insulin Human (INSULIN PUMP) SOLN Inject into the skin.    Marland Kitchen insulin regular human CONCENTRATED (HUMULIN R) 500 UNIT/ML kwikpen Inject 40-80 Units into the skin See admin instructions. Inject 80u under the skin  at breakfast, inject 65-70u under the skin at lunch and inject 40-80u under the skin at dinner time based on blood glucose readings    . levothyroxine (SYNTHROID) 200 MCG tablet Take 200 mcg by mouth daily before breakfast.    . LINZESS 290 MCG CAPS capsule Take 290 mcg by mouth daily.    . metoCLOPramide (REGLAN) 10 MG tablet Take 10 mg by mouth 3 (three) times daily.    . metolazone (ZAROXOLYN) 5 MG tablet Take 5 mg by mouth 2 (two) times a week.    . nitroGLYCERIN (NITRODUR - DOSED IN MG/24 HR) 0.4 mg/hr patch Place onto the skin.    Marland Kitchen ondansetron (ZOFRAN) 4 MG tablet Take 4 mg by mouth every 12 (twelve) hours as needed for nausea or vomiting.     Marland Kitchen oxyCODONE-acetaminophen (PERCOCET) 10-325 MG tablet Take 1 tablet by mouth every 12 (twelve) hours as needed for pain. Must last 30 days. 60 tablet 0  . pantoprazole (PROTONIX) 40 MG tablet Take 40 mg by mouth 2 (two) times daily.     . potassium chloride (K-DUR) 10 MEQ tablet Take 10 mEq by mouth daily.    . pramipexole (MIRAPEX) 0.5 MG tablet Take 0.5 mg by mouth 2 (two) times daily.     . traMADol (ULTRAM) 50 MG tablet Take 100 mg by mouth at bedtime as needed.   3  . traZODone (DESYREL) 150 MG tablet Take 150 mg by mouth at bedtime.     Marland Kitchen zolpidem (AMBIEN) 10 MG tablet Take 10 mg by mouth at bedtime.     . candesartan (ATACAND) 32 MG tablet Take 1 tablet (32 mg total) by mouth daily. (Patient not taking: Reported on 04/25/2020) 30 tablet 0  . rosuvastatin (CRESTOR) 20 MG tablet Take 20 mg by mouth daily.     No current facility-administered medications for this visit.    OBJECTIVE: Vitals:   04/28/20 1355  BP: (!) 157/54  Pulse: 63  Resp: 20  Temp: 98 F (36.7 C)  SpO2: 91%     Body mass index is 52.99 kg/m.    ECOG FS:2 - Symptomatic, <50% confined to bed  General: Well-developed, well-nourished, no acute distress.  Sitting in a wheelchair. Eyes: Pink conjunctiva, anicteric sclera.  HEENT: Normocephalic, moist mucous  membranes. Lungs: No audible wheezing or coughing. Heart: Regular rate and rhythm. Abdomen: Soft, nontender, no obvious distention. Musculoskeletal: No edema, cyanosis, or clubbing. Neuro: Alert, answering all questions appropriately. Cranial nerves grossly intact. Skin: No rashes or petechiae noted. Psych: Normal affect.   LAB RESULTS:  Lab Results  Component Value Date   NA 138 08/01/2019   K 3.9 08/01/2019   CL 101 08/01/2019   CO2 29 08/01/2019   GLUCOSE 198 (H) 08/01/2019   BUN 10 08/01/2019   CREATININE 0.64 08/01/2019   CALCIUM 9.6 08/01/2019   PROT 7.2 07/30/2019   ALBUMIN 3.3 (L) 07/30/2019   AST 39 07/30/2019   ALT 34 07/30/2019   ALKPHOS 125 07/30/2019   BILITOT 0.5 07/30/2019   GFRNONAA >60 08/01/2019   GFRAA >60 08/01/2019    Lab Results  Component Value Date   WBC 7.8 04/28/2020   NEUTROABS 4.9 04/28/2020   HGB 12.5 04/28/2020   HCT 39.1 04/28/2020   MCV 87.9 04/28/2020   PLT 183 04/28/2020   Lab Results  Component Value Date   IRON 32 02/22/2020   TIBC 374 02/22/2020   IRONPCTSAT 9 (L) 02/22/2020   Lab Results  Component Value Date   FERRITIN 18 02/22/2020     STUDIES: US Abdomen Complete  Result Date: 04/25/2020 CLINICAL DATA:  Cirrhosis of liver. EXAM: ABDOMEN ULTRASOUND COMPLETE COMPARISON:  CT 04/13/2019.  Ultrasound 11/07/2018. FINDINGS: Gallbladder: Cholecystectomy. Common bile duct: Diameter: 11.5 cm Liver: Increased echogenicity consistent fatty infiltration or hepatocellular disease. No focal hepatic abnormality identified. Portal vein is patent on color Doppler imaging with normal direction of blood flow towards the liver. IVC: No abnormality visualized. Pancreas: Visualized portion unremarkable. Spleen: Size and appearance within normal limits. Right Kidney: Length: 10.7 cm. Increased echogenicity. No mass or hydronephrosis visualized. Left Kidney: Length: 11.9 cm. Increased echogenicity. No mass or hydronephrosis visualized. Abdominal  aorta: No aneurysm visualized. Other findings: None. IMPRESSION: 1. Cholecystectomy. Common bile duct measures 11.5 mm. Common bile duct prominence may be secondary to cholecystectomy. Correlate with liver function tests. 2. Increased hepatic echogenicity consistent with fatty infiltration. 3.  Echogenic kidneys consistent chronic medical renal disease. Electronically Signed   By: Marcello Moores  Register   On: 04/25/2020 11:40    ASSESSMENT: Iron deficiency anemia.  PLAN:    1. Iron deficiency anemia:  Colonoscopy and EGD completed on September 20, 2016 were essentially normal other than some mild gastritis.  Patient's hemoglobin has significantly improved and is now within normal limits at 12.5.  Iron stores are pending at time of dictation.  Previously, the remainder of her laboratory work was either negative or within normal limits.  She does not require additional IV Feraheme today.  Patient last received treatment on March 10, 2020.  Return to clinic in 3 months with repeat laboratory work, further evaluation, and consideration of additional treatment if needed.   2.  Back pain: Chronic and unchanged.  Patient has permanent spinal stimulator in place. 3.  Diabetes: Patient now has insulin pump. 4.  Persistently cold/hair thinning: Patient's Synthroid is being adjusted by primary care.    Patient expressed understanding and was in agreement with this plan. She also understands that She can call clinic at any time with any questions, concerns, or complaints.    Lloyd Huger, MD   04/28/2020 2:44 PM

## 2020-04-28 ENCOUNTER — Other Ambulatory Visit: Payer: Self-pay

## 2020-04-28 ENCOUNTER — Encounter: Payer: Self-pay | Admitting: Oncology

## 2020-04-28 ENCOUNTER — Inpatient Hospital Stay: Payer: Medicare Other | Attending: Oncology

## 2020-04-28 ENCOUNTER — Inpatient Hospital Stay: Payer: Medicare Other

## 2020-04-28 ENCOUNTER — Inpatient Hospital Stay (HOSPITAL_BASED_OUTPATIENT_CLINIC_OR_DEPARTMENT_OTHER): Payer: Medicare Other | Admitting: Oncology

## 2020-04-28 ENCOUNTER — Telehealth: Payer: Self-pay | Admitting: *Deleted

## 2020-04-28 VITALS — BP 157/54 | HR 63 | Temp 98.0°F | Resp 20 | Wt 328.3 lb

## 2020-04-28 DIAGNOSIS — D509 Iron deficiency anemia, unspecified: Secondary | ICD-10-CM | POA: Insufficient documentation

## 2020-04-28 DIAGNOSIS — Z794 Long term (current) use of insulin: Secondary | ICD-10-CM | POA: Insufficient documentation

## 2020-04-28 DIAGNOSIS — M549 Dorsalgia, unspecified: Secondary | ICD-10-CM | POA: Insufficient documentation

## 2020-04-28 DIAGNOSIS — D649 Anemia, unspecified: Secondary | ICD-10-CM

## 2020-04-28 DIAGNOSIS — G8929 Other chronic pain: Secondary | ICD-10-CM | POA: Insufficient documentation

## 2020-04-28 DIAGNOSIS — E1122 Type 2 diabetes mellitus with diabetic chronic kidney disease: Secondary | ICD-10-CM | POA: Insufficient documentation

## 2020-04-28 DIAGNOSIS — N182 Chronic kidney disease, stage 2 (mild): Secondary | ICD-10-CM | POA: Insufficient documentation

## 2020-04-28 DIAGNOSIS — I129 Hypertensive chronic kidney disease with stage 1 through stage 4 chronic kidney disease, or unspecified chronic kidney disease: Secondary | ICD-10-CM | POA: Diagnosis not present

## 2020-04-28 LAB — CBC WITH DIFFERENTIAL/PLATELET
Abs Immature Granulocytes: 0.04 10*3/uL (ref 0.00–0.07)
Basophils Absolute: 0.1 10*3/uL (ref 0.0–0.1)
Basophils Relative: 1 %
Eosinophils Absolute: 0.2 10*3/uL (ref 0.0–0.5)
Eosinophils Relative: 3 %
HCT: 39.1 % (ref 36.0–46.0)
Hemoglobin: 12.5 g/dL (ref 12.0–15.0)
Immature Granulocytes: 1 %
Lymphocytes Relative: 26 %
Lymphs Abs: 2 10*3/uL (ref 0.7–4.0)
MCH: 28.1 pg (ref 26.0–34.0)
MCHC: 32 g/dL (ref 30.0–36.0)
MCV: 87.9 fL (ref 80.0–100.0)
Monocytes Absolute: 0.5 10*3/uL (ref 0.1–1.0)
Monocytes Relative: 7 %
Neutro Abs: 4.9 10*3/uL (ref 1.7–7.7)
Neutrophils Relative %: 62 %
Platelets: 183 10*3/uL (ref 150–400)
RBC: 4.45 MIL/uL (ref 3.87–5.11)
RDW: 16.8 % — ABNORMAL HIGH (ref 11.5–15.5)
WBC: 7.8 10*3/uL (ref 4.0–10.5)
nRBC: 0 % (ref 0.0–0.2)

## 2020-04-28 LAB — IRON AND TIBC
Iron: 52 ug/dL (ref 28–170)
Saturation Ratios: 19 % (ref 10.4–31.8)
TIBC: 274 ug/dL (ref 250–450)
UIBC: 222 ug/dL

## 2020-04-28 LAB — FERRITIN: Ferritin: 85 ng/mL (ref 11–307)

## 2020-04-28 NOTE — Progress Notes (Signed)
Patient here today for follow up. Would like provider to discuss lab results with her. Denies other concerns at this time.

## 2020-04-28 NOTE — Telephone Encounter (Signed)
Left voicemail with patient to please call to go over information prior to VV.

## 2020-04-29 ENCOUNTER — Ambulatory Visit
Payer: Medicare Other | Attending: Student in an Organized Health Care Education/Training Program | Admitting: Student in an Organized Health Care Education/Training Program

## 2020-04-29 ENCOUNTER — Encounter: Payer: Self-pay | Admitting: Student in an Organized Health Care Education/Training Program

## 2020-04-29 DIAGNOSIS — M4804 Spinal stenosis, thoracic region: Secondary | ICD-10-CM

## 2020-04-29 DIAGNOSIS — G894 Chronic pain syndrome: Secondary | ICD-10-CM

## 2020-04-29 DIAGNOSIS — M961 Postlaminectomy syndrome, not elsewhere classified: Secondary | ICD-10-CM

## 2020-04-29 DIAGNOSIS — M1712 Unilateral primary osteoarthritis, left knee: Secondary | ICD-10-CM | POA: Diagnosis not present

## 2020-04-29 DIAGNOSIS — M47816 Spondylosis without myelopathy or radiculopathy, lumbar region: Secondary | ICD-10-CM | POA: Diagnosis not present

## 2020-04-29 DIAGNOSIS — Z9889 Other specified postprocedural states: Secondary | ICD-10-CM

## 2020-04-29 DIAGNOSIS — M5416 Radiculopathy, lumbar region: Secondary | ICD-10-CM

## 2020-04-29 DIAGNOSIS — M48061 Spinal stenosis, lumbar region without neurogenic claudication: Secondary | ICD-10-CM | POA: Diagnosis not present

## 2020-04-29 DIAGNOSIS — Z9689 Presence of other specified functional implants: Secondary | ICD-10-CM

## 2020-04-29 MED ORDER — OXYCODONE-ACETAMINOPHEN 10-325 MG PO TABS: 1.0000 | ORAL_TABLET | Freq: Two times a day (BID) | ORAL | 0 refills | Status: DC | PRN

## 2020-04-29 MED ORDER — OXYCODONE-ACETAMINOPHEN 10-325 MG PO TABS: 1.0000 | ORAL_TABLET | Freq: Two times a day (BID) | ORAL | 0 refills | Status: AC | PRN

## 2020-04-29 NOTE — Progress Notes (Signed)
Patient: Brittney [name]  Service Category: E/M  Provider: [name], MD  DOB: 1951-07-13  DOS: 04/29/2020  Location: Office  MRN: [id number]  Setting: Ambulatory outpatient  Referring Provider: Rusty Aus, MD  Type: Established Patient  Specialty: Interventional Pain Management  PCP: Rusty Aus, MD  Location: Home  Delivery: TeleHealth     Virtual Encounter - Pain Management PROVIDER NOTE: Information contained herein reflects review and annotations entered in association with encounter. Interpretation of such information and data should be left to medically-trained personnel. Information provided to patient can be located elsewhere in the medical record under "Patient Instructions". Document created using STT-dictation technology, any transcriptional errors that may result from process are unintentional.    Contact & Pharmacy Preferred: [phone] Home: [phone] (home) Mobile: [phone] (mobile) E-mail: [email or web address]  [id number] [place], [place] - [id number]: [phone]: [phone]  Pre-screening  Ms. Brittney [name] offered "in-person" vs "virtual" encounter. She indicated preferring virtual for this encounter.   Reason COVID-19*  Social distancing based on CDC and AMA recommendations.   I contacted Brittney Rotaon 04/29/2020 via video conference.      I clearly identified myself as BGillis Santa MD. I verified that I was speaking with the correct person using two identifiers (Name: Brittney [name] and date of birth: [birth year].  Consent I sought verbal advanced consent from Brittney Rotafor virtual visit interactions. I informed Ms. JAccardiof possible security and privacy concerns, risks, and limitations associated with providing "not-in-person" medical evaluation and management services. I also informed Ms. Stickley of the availability of "in-person" appointments. Finally, I informed her that there would  be a charge for the virtual visit and that she could be  personally, fully or partially, financially responsible for it. Ms. JSchoenfelderexpressed understanding and agreed to proceed.   Historic Elements   Ms. PTACI STERLINGis a 69y.o. year old, female patient evaluated today after her last contact with our practice on 04/28/2020. Ms. JAsher has a past medical history of Abdominal pain, left upper quadrant (12/11/2012), Arthritis, Asthma, Broken leg (2014), Cancer ([place] (2007), Chronic kidney disease, stage 2 (mild), Chronic lower back pain, Collagen vascular disease ([place], Coronary artery disease, Diabetes mellitus without complication ([place], Diabetic nephropathy associated with secondary diabetes mellitus ([place], [place] (12/11/2012), Gastroparesis, Gross hematuria (12/11/2012), Hepatic cirrhosis ([place], Hyperlipidemia, Hypertension, Hypothyroidism, Hypothyroidism, IBS (irritable bowel syndrome), Kidney stone (12/11/2012), Lower extremity edema, Morbid obesity with BMI of 40.0-44.9, adult ([place], Myocardial infarction ([place] (09/2013), Nausea without vomiting (12/11/2012), Nephrolithiasis, Nephrolithiasis (04/26/2014), Nonproliferative retinopathy due to secondary diabetes ([place], Numbness and tingling of right leg, Peripheral vascular disease ([place], Renal colic (145/80/9983, Renal insufficiency, Sciatica, Sciatica (12/11/2012), Skin cancer (08/2018), Sleep apnea, and Unilateral small kidney without contralateral hypertrophy. She also  has a past surgical history that includes Cesarean section (1980); Dilation and curettage of uterus; Eye surgery (1986); Abdominal hysterectomy; Tonsillectomy; Esophagogastroduodenoscopy (02/08/2014); Colonoscopy (05/04/2001); Esophagogastroduodenoscopy (egd) with propofol (N/A, 09/20/2016); Colonoscopy with propofol (N/A, 09/20/2016); Mohs surgery (11/2016); Cholecystectomy; Hernia repair; Joint replacement (Left, 09/24/2013); Back surgery (04/1999); Pulse generator implant (N/A,  11/02/2017); and Esophagogastroduodenoscopy (egd) with propofol (N/A, 05/23/2018). Ms. JAcocellahas a current medication list which includes the following prescription(s): albuterol, alprazolam, butalbital-acetaminophen-caffeine, carvedilol, clonidine, cyclobenzaprine, diclofenac sodium, doxazosin, escitalopram, flovent hfa, fluticasone, furosemide, gabapentin, hydralazine, ibuprofen, insulin detemir, insulin pump, insulin regular human concentrated, levothyroxine, linzess, metoclopramide, metolazone, nitroglycerin, ondansetron, [START ON 05/27/2020] oxycodone-acetaminophen, [START ON 06/26/2020] oxycodone-acetaminophen,  pantoprazole, potassium chloride, pramipexole, rosuvastatin, tramadol, trazodone, and zolpidem. She  reports that she has never smoked. She has never used smokeless tobacco. She reports that she does not drink alcohol or use drugs. Ms. Milles is allergic to eggs or egg-derived products; shellfish allergy; amlodipine; codeine; imdur [isosorbide dinitrate]; lyrica [pregabalin]; monosodium glutamate; and tape.   HPI  Today, she is being contacted for medication management.  States that she is having difficulty walking due to right knee and leg pain. Can only take 5-6 steps with walker before she has to sit down secondary to weakness in right leg. States that her daughter is having to take her everywhere due to pain and weakness Is driving much less now Patient continues her Percocet as prescribed, 10 mg twice daily as needed.  She also continues her gabapentin, Flexeril, Xanax, Ambien that is managed by Dr. Sabra Heck.  I counseled the patient on concomitant opioid and benzodiazepine therapy especially in the context of her morbid obesity and OSA.  Patient states that she does not take these medications together and she has done fairly well at managing them.  She does have a very complex situation and has significant disability from her lumbar spine pathology and chronic radicular  pain.  Pharmacotherapy Assessment  Analgesic: 04/21/2020  1   01/30/2020  Oxycodone-Acetaminophen 10-325  60.00  30 Bi Lat   [id number]   Nor (1409)   0  30.00 MME  Medicare   [place]    Monitoring:  PMP: PDMP reviewed during this encounter.       Pharmacotherapy: No side-effects or adverse reactions reported. Compliance: No problems identified. Effectiveness: Clinically acceptable. Plan: Refer to "POC".  UDS:  Summary  Date Value Ref Range Status  01/03/2019 FINAL  Final    Comment:    ==================================================================== TOXASSURE SELECT 13 (MW) ==================================================================== Test                             Result       Flag       Units Drug Present and Declared for Prescription Verification   Alprazolam                     165          EXPECTED   ng/mg creat   Alpha-hydroxyalprazolam        412          EXPECTED   ng/mg creat    Source of alprazolam is a scheduled prescription medication.    Alpha-hydroxyalprazolam is an expected metabolite of alprazolam.   Oxycodone                      812          EXPECTED   ng/mg creat   Oxymorphone                    932          EXPECTED   ng/mg creat   Noroxycodone                   1294         EXPECTED   ng/mg creat   Noroxymorphone                 332          EXPECTED   ng/mg creat    Sources of oxycodone are  scheduled prescription medications.    Oxymorphone, noroxycodone, and noroxymorphone are expected    metabolites of oxycodone. Oxymorphone is also available as a    scheduled prescription medication.   Tramadol                       2565         EXPECTED   ng/mg creat   O-Desmethyltramadol            7935         EXPECTED   ng/mg creat   N-Desmethyltramadol            2141         EXPECTED   ng/mg creat    Source of tramadol is a prescription medication.    O-desmethyltramadol and N-desmethyltramadol are expected    metabolites of tramadol.   Butalbital                      PRESENT      EXPECTED ==================================================================== Test                      Result    Flag   Units      Ref Range   Creatinine              34               mg/dL      >=20 ==================================================================== Declared Medications:  The flagging and interpretation on this report are based on the  following declared medications.  Unexpected results may arise from  inaccuracies in the declared medications.  **Note: The testing scope of this panel includes these medications:  Alprazolam  Butalbital (Butalbital/APAP/Caffeine)  Oxycodone (Percocet)  Tramadol (Ultram)  **Note: The testing scope of this panel does not include following  reported medications:  Acetaminophen (Butalbital/APAP/Caffeine)  Acetaminophen (Percocet)  Albuterol  Bumetanide  Caffeine (Butalbital/APAP/Caffeine)  Candesartan  Carvedilol  Clonidine  Cyclobenzaprine (Flexeril)  Doxazosin (Cardura)  Escitalopram (Lexapro)  Fluticasone (Flonase)  Fluticasone (Flovent)  Gabapentin (Neurontin)  Hydralazine (Apresoline)  Ibuprofen (Advil)  Insulin (Humulin)  Insulin (Levemir)  Lactulose  Levothyroxine  Lubiprostone (Amitiza)  Metoclopramide (Reglan)  Pantoprazole (Protonix)  Potassium  Pramipexole (Mirapex)  Sucralfate (Carafate)  Topical Diclofenac (Voltaren)  Trazodone (Desyrel)  Zolpidem (Ambien) ==================================================================== For clinical consultation, please call [phone]. ====================================================================    Laboratory Chemistry Profile   Renal Lab Results  Component Value Date   BUN 10 08/01/2019   CREATININE 0.64 08/01/2019   GFRAA >60 08/01/2019   GFRNONAA >60 08/01/2019     Hepatic Lab Results  Component Value Date   AST 39 07/30/2019   ALT 34 07/30/2019   ALBUMIN 3.3 (L) 07/30/2019   ALKPHOS 125 07/30/2019   LIPASE 114  10/22/2014     Electrolytes Lab Results  Component Value Date   NA 138 08/01/2019   K 3.9 08/01/2019   CL 101 08/01/2019   CALCIUM 9.6 08/01/2019   MG 1.8 12/07/2016     Bone Lab Results  Component Value Date   25OHVITD1 26 (L) 12/07/2016   25OHVITD2 1.2 12/07/2016   25OHVITD3 25 12/07/2016     Inflammation (CRP: Acute Phase) (ESR: Chronic Phase) Lab Results  Component Value Date   CRP 1.2 (H) 12/07/2016   ESRSEDRATE 56 (H) 12/07/2016   LATICACIDVEN 2.2 (HH) 07/30/2019   LATICACIDVEN 2.2 ([place]) 07/30/2019       Note: Above Lab results  reviewed.  Imaging  US Abdomen Complete CLINICAL DATA:  Cirrhosis of liver.  EXAM: ABDOMEN ULTRASOUND COMPLETE  COMPARISON:  CT 04/13/2019.  Ultrasound 11/07/2018.  FINDINGS: Gallbladder: Cholecystectomy.  Common bile duct: Diameter: 11.5 cm  Liver: Increased echogenicity consistent fatty infiltration or hepatocellular disease. No focal hepatic abnormality identified. Portal vein is patent on color Doppler imaging with normal direction of blood flow towards the liver.  IVC: No abnormality visualized.  Pancreas: Visualized portion unremarkable.  Spleen: Size and appearance within normal limits.  Right Kidney: Length: 10.7 cm. Increased echogenicity. No mass or hydronephrosis visualized.  Left Kidney: Length: 11.9 cm. Increased echogenicity. No mass or hydronephrosis visualized.  Abdominal aorta: No aneurysm visualized.  Other findings: None.  IMPRESSION: 1. Cholecystectomy. Common bile duct measures 11.5 mm. Common bile duct prominence may be secondary to cholecystectomy. Correlate with liver function tests.  2. Increased hepatic echogenicity consistent with fatty infiltration.  3.  Echogenic kidneys consistent chronic medical renal disease.  Electronically Signed   By: Marcello Moores  Register   On: 04/25/2020 11:40  Assessment  The primary encounter diagnosis was L3-4 severe lumbar facet hypertrophy and spinal  stenosis. Diagnoses of Chronic pain syndrome, L3-4 severe lumbar spinal stenosis (12/31/2016 MRI), Primary osteoarthritis of left knee, Failed back surgical syndrome, Spinal stenosis of thoracic region, Spinal cord stimulator status ([place]), and Status post lumbar spine surgery for decompression of spinal cord were also pertinent to this visit.  Plan of Care   Ms. CHINENYE KATZENBERGER has a current medication list which includes the following long-term medication(s): albuterol, carvedilol, doxazosin, escitalopram, flovent hfa, fluticasone, gabapentin, hydralazine, insulin detemir, insulin pump, insulin regular human concentrated, metoclopramide, metolazone, pantoprazole, potassium chloride, pramipexole, tramadol, trazodone, and zolpidem.  Pharmacotherapy (Medications Ordered): Meds ordered this encounter  Medications  . oxyCODONE-acetaminophen (PERCOCET) 10-325 MG tablet    Sig: Take 1 tablet by mouth every 12 (twelve) hours as needed for pain. Must last 30 days.    Dispense:  60 tablet    Refill:  0    Chronic Pain. (STOP Act - Not applicable). Fill one day early if closed on scheduled refill date.  Marland Kitchen oxyCODONE-acetaminophen (PERCOCET) 10-325 MG tablet    Sig: Take 1 tablet by mouth every 12 (twelve) hours as needed for pain. Must last 30 days.    Dispense:  60 tablet    Refill:  0    Chronic Pain. (STOP Act - Not applicable). Fill one day early if closed on scheduled refill date.   Follow-up plan:   Return in about 8 weeks (around 06/24/2020) for Medication Management, in person.     s/p GNB (R) 07/18/19-helpful, provided greater than 50% pain relief for 5 days however patient did sustain a fall due to UTI associated sepsis did spend 3 days in the hospital.  Prior to her fall she was obtaining significant pain relief with genicular nerve block.  Status post right genicular RFA on 08/29/2019.  Left GN RFA on 02/12/19.      Recent Visits Date Type Provider Dept  02/13/20 Procedure visit  [name], MD [facility]  01/30/20 Office Visit [name], MD [facility]  Showing recent visits within past 90 days and meeting all other requirements   Future Appointments No visits were found meeting these conditions.  Showing future appointments within next 90 days and meeting all other requirements   I discussed the assessment and treatment plan with the patient. The patient was provided an opportunity to ask questions and all  were answered. The patient agreed with the plan and demonstrated an understanding of the instructions.  Patient advised to call back or seek an in-person evaluation if the symptoms or condition worsens.  Duration of encounter: 25 minutes.  Note by: [name], MD Date: 04/29/2020; Time: 3:00 PM

## 2020-04-30 ENCOUNTER — Other Ambulatory Visit: Payer: Self-pay | Admitting: Gastroenterology

## 2020-04-30 DIAGNOSIS — K7469 Other cirrhosis of liver: Secondary | ICD-10-CM

## 2020-04-30 DIAGNOSIS — R935 Abnormal findings on diagnostic imaging of other abdominal regions, including retroperitoneum: Secondary | ICD-10-CM

## 2020-04-30 DIAGNOSIS — K838 Other specified diseases of biliary tract: Secondary | ICD-10-CM

## 2020-05-14 ENCOUNTER — Ambulatory Visit
Admission: RE | Admit: 2020-05-14 | Discharge: 2020-05-14 | Disposition: A | Discharge status: Home / Self Care | Payer: Medicare Other | Source: Ambulatory Visit | Attending: Gastroenterology | Admitting: Gastroenterology

## 2020-05-14 ENCOUNTER — Other Ambulatory Visit: Payer: Self-pay

## 2020-05-14 DIAGNOSIS — R935 Abnormal findings on diagnostic imaging of other abdominal regions, including retroperitoneum: Secondary | ICD-10-CM

## 2020-05-14 DIAGNOSIS — K7469 Other cirrhosis of liver: Secondary | ICD-10-CM

## 2020-05-14 DIAGNOSIS — K838 Other specified diseases of biliary tract: Secondary | ICD-10-CM

## 2020-06-24 ENCOUNTER — Ambulatory Visit
Payer: Medicare Other | Attending: Student in an Organized Health Care Education/Training Program | Admitting: Student in an Organized Health Care Education/Training Program

## 2020-06-24 ENCOUNTER — Other Ambulatory Visit: Payer: Self-pay

## 2020-06-24 ENCOUNTER — Encounter: Payer: Self-pay | Admitting: Student in an Organized Health Care Education/Training Program

## 2020-06-24 VITALS — BP 168/65 | HR 66 | Temp 98.2°F | Resp 18 | Ht 66.0 in | Wt 323.0 lb

## 2020-06-24 DIAGNOSIS — Z9689 Presence of other specified functional implants: Secondary | ICD-10-CM

## 2020-06-24 DIAGNOSIS — M48061 Spinal stenosis, lumbar region without neurogenic claudication: Secondary | ICD-10-CM | POA: Diagnosis not present

## 2020-06-24 DIAGNOSIS — M1711 Unilateral primary osteoarthritis, right knee: Secondary | ICD-10-CM

## 2020-06-24 DIAGNOSIS — M47816 Spondylosis without myelopathy or radiculopathy, lumbar region: Secondary | ICD-10-CM | POA: Diagnosis not present

## 2020-06-24 DIAGNOSIS — M5416 Radiculopathy, lumbar region: Secondary | ICD-10-CM

## 2020-06-24 DIAGNOSIS — Z9889 Other specified postprocedural states: Secondary | ICD-10-CM

## 2020-06-24 DIAGNOSIS — G894 Chronic pain syndrome: Secondary | ICD-10-CM | POA: Diagnosis not present

## 2020-06-24 DIAGNOSIS — M7918 Myalgia, other site: Secondary | ICD-10-CM

## 2020-06-24 DIAGNOSIS — M961 Postlaminectomy syndrome, not elsewhere classified: Secondary | ICD-10-CM

## 2020-06-24 DIAGNOSIS — M1712 Unilateral primary osteoarthritis, left knee: Secondary | ICD-10-CM

## 2020-06-24 MED ORDER — OXYCODONE-ACETAMINOPHEN 10-325 MG PO TABS: 1.0000 | ORAL_TABLET | Freq: Two times a day (BID) | ORAL | 0 refills | Status: AC | PRN

## 2020-06-24 MED ORDER — OXYCODONE-ACETAMINOPHEN 10-325 MG PO TABS: 1.0000 | ORAL_TABLET | Freq: Two times a day (BID) | ORAL | 0 refills | Status: DC | PRN

## 2020-06-24 NOTE — Progress Notes (Signed)
Nursing Pain Medication Assessment:  Safety precautions to be maintained throughout the outpatient stay will include: orient to surroundings, keep bed in low position, maintain call bell within reach at all times, provide assistance with transfer out of bed and ambulation.  Medication Inspection Compliance: Pill count conducted under aseptic conditions, in front of the patient. Neither the pills nor the bottle was removed from the patient's sight at any time. Once count was completed pills were immediately returned to the patient in their original bottle.  Medication: Oxycodone IR Pill/Patch Count: No pills available to be counted. Pill/Patch Appearance: none brought in Bottle Appearance: No container available. Did not bring bottle(s) to appointment. Filled Date: 00 / 00 / 2021 Last Medication intake:  Today

## 2020-06-24 NOTE — Patient Instructions (Signed)
Brittney [name] is a patient of mine at the [place] [facility].  She has a complex medical history with significant pain generators.  Of note, she has significant lumbar spine pathology with a history of lumbar spine surgery which was not very helpful.  She currently has a spinal cord stimulator in place.  Patient also has significant knee arthritis and ankle arthropathy that significantly limits her from ambulating and performing activities of daily living.  It is imperative that the patient continue to have a home health aide to help her with her daily chores and ADLs.  If you require any additional supportive information, please do not hesitate to reach out.  Dr [name]

## 2020-06-24 NOTE — Progress Notes (Signed)
PROVIDER NOTE: Information contained herein reflects review and annotations entered in association with encounter. Interpretation of such information and data should be left to medically-trained personnel. Information provided to patient can be located elsewhere in the medical record under "Patient Instructions". Document created using STT-dictation technology, any transcriptional errors that may result from process are unintentional.    Patient: Brittney [name]  Service Category: E/M  Provider: [name], MD  DOB: 07-Mar-1951  DOS: 06/24/2020  Specialty: Interventional Pain Management  MRN: [id number]  Setting: Ambulatory outpatient  PCP: Rusty Aus, MD  Type: Established Patient    Referring Provider: Rusty Aus, MD  Location: Office  Delivery: Face-to-face     HPI  Reason for encounter: Brittney [name], a 69 y.o. year old female, is here today for evaluation and management of her Facet hypertrophy of lumbar region [M47.816]. Brittney [name] primary complain today is Back Pain (low), Knee Pain (right), and Ankle Pain (right) Last encounter: Practice (04/28/2020). My last encounter with her was on 03/30/2020. 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 Pain Assessment: Severity of Chronic pain is reported as a 7 /10. Location: Back Lower/right knee and ankle. Onset: More than a month ago. Quality: Constant, Aching. Timing: Constant. Modifying factor(s): medications, rest, feet up. Vitals:  height is _0  (1.676 m) and weight is 323 lb (146.5 kg) (abnormal). Her temporal temperature is 98.2 F (36.8 C). Her blood pressure is 168/65 (abnormal) and her pulse is 66. Her respiration is 18 and oxygen saturation is 95%.   Continues to endorse significant low back pain that radiates into her right hip and right leg.  She is also having severe right knee pain.  She has been referred to [facility] for possible surgical intervention.  Patient is also seeing podiatry for her right ankle pain and has been told that she needs right ankle surgery.  The surgery would require her to not walk for 6 months.  Otherwise she presents today for refill of her oxycodone.  She continues her chronic pain medications as prescribed.  She is accompanied today by her daughter.  Patient states that she is having issues with urinary incontinence.  I do not believe that this is coming from an acute neurological source (I.e. cauda equina syndrome).  Recommend that she reach out to Dr. Sabra Heck to discuss and consider referral to urology.  We also discussed weight loss strategies and minimizing as needed sedative medications.  Patient endorsed understanding.  ROS  Constitutional: Denies any fever or chills Gastrointestinal: No reported hemesis, hematochezia, vomiting, or acute GI distress Musculoskeletal: Denies any acute onset joint swelling, redness, loss of ROM, or weakness Neurological: No reported episodes of  acute onset apraxia,  aphasia, dysarthria, agnosia, amnesia, paralysis, loss of coordination, or loss of consciousness  Medication Review  ALPRAZolam, albuterol, butalbital-acetaminophen-caffeine, carvedilol, cloNIDine, cyclobenzaprine, doxazosin, escitalopram, fluticasone, furosemide, gabapentin, hydrALAZINE, ibuprofen, insulin detemir, insulin pump, insulin regular human CONCENTRATED, levothyroxine, linaclotide, metoCLOPramide, metolazone, nitroGLYCERIN, ondansetron, oxyCODONE-acetaminophen, pantoprazole, potassium chloride, pramipexole, rosuvastatin, traMADol, traZODone, and zolpidem  History Review  Allergy: Brittney [name] is allergic to eggs or egg-derived products, shellfish allergy, amlodipine, codeine, imdur [isosorbide dinitrate], lyrica [pregabalin], monosodium glutamate, and tape. Drug: Brittney [name]  reports no history of drug use. Alcohol:  reports no history of alcohol use. Tobacco:  reports that she has never smoked. She has never used smokeless tobacco. Social: Brittney [name]  reports that she has never smoked. She has never used smokeless tobacco. She reports that she does not drink alcohol and does not use drugs. Medical:  has a past medical history of Abdominal pain, left upper quadrant (12/11/2012), Arthritis, Asthma, Broken leg (2014), Cancer ([place]) (2007), Chronic kidney disease, stage 2 (mild), Chronic lower back pain, Collagen vascular disease ([place]), Coronary artery disease, Diabetes mellitus without complication ([place]), Diabetic nephropathy associated with secondary diabetes mellitus ([place]), Flushing (12/11/2012), Gastroparesis, Gross hematuria (12/11/2012), Hepatic cirrhosis ([place]), Hyperlipidemia, Hypertension, Hypothyroidism, Hypothyroidism, IBS (irritable bowel syndrome), Kidney stone (12/11/2012), Lower extremity edema, Morbid obesity with BMI of 40.0-44.9, adult ([place]), Myocardial infarction ([place]) (09/2013), Nausea without vomiting (12/11/2012), Nephrolithiasis, Nephrolithiasis (04/26/2014), 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 Surgical: Brittney [name]  has a past surgical history that includes Cesarean section (1980); Dilation and curettage of uterus; Eye surgery (1986); Abdominal hysterectomy; Tonsillectomy; Esophagogastroduodenoscopy (02/08/2014); Colonoscopy (05/04/2001); Esophagogastroduodenoscopy (egd) with propofol (N/A, 09/20/2016); Colonoscopy with propofol (N/A, 09/20/2016); Mohs surgery (11/2016); Cholecystectomy; Hernia repair; Joint replacement (Left, 09/24/2013); Back surgery (04/1999); Pulse generator implant (N/A, 11/02/2017); and Esophagogastroduodenoscopy (egd) with propofol (N/A, 05/23/2018). Family: family history includes Asthma in her mother; Cancer in her mother; Diabetes in her father; Heart disease in her mother; Hypertension in her father; Kidney disease in her father.  Laboratory Chemistry Profile   Renal Lab Results  Component Value Date   BUN 10 08/01/2019   CREATININE 0.64 08/01/2019   GFRAA >60 08/01/2019   GFRNONAA >60 08/01/2019     Hepatic Lab Results  Component Value Date   AST 39 07/30/2019   ALT 34 07/30/2019   ALBUMIN 3.3 (L) 07/30/2019   ALKPHOS 125 07/30/2019   LIPASE 114 10/22/2014     Electrolytes Lab Results  Component Value Date   NA 138 08/01/2019   K 3.9 08/01/2019   CL 101 08/01/2019   CALCIUM 9.6 08/01/2019   MG 1.8 12/07/2016     Bone Lab Results  Component Value Date   25OHVITD1 26 (L) 12/07/2016   25OHVITD2 1.2 12/07/2016   25OHVITD3 25 12/07/2016     Inflammation (CRP: Acute Phase) (ESR: Chronic Phase) Lab Results  Component Value Date   CRP 1.2 (H) 12/07/2016   ESRSEDRATE 56 (H) 12/07/2016   LATICACIDVEN 2.2 (HH) 07/30/2019   LATICACIDVEN 2.2 ([place]) 07/30/2019        Note: Above Lab results reviewed.  Recent Imaging Review  US Abdomen Complete CLINICAL DATA:  Cirrhosis of liver.  EXAM: ABDOMEN ULTRASOUND COMPLETE  COMPARISON:  CT 04/13/2019.  Ultrasound 11/07/2018.  FINDINGS: Gallbladder: Cholecystectomy.  Common bile duct: Diameter: 11.5 cm  Liver: Increased echogenicity  consistent fatty infiltration or hepatocellular disease. No focal hepatic abnormality identified. Portal vein is patent on color Doppler imaging with normal direction of blood flow towards the liver.  IVC: No abnormality visualized.  Pancreas: Visualized portion unremarkable.  Spleen: Size and appearance within normal limits.  Right Kidney: Length: 10.7 cm. Increased echogenicity. No mass or hydronephrosis visualized.  Left Kidney: Length: 11.9 cm. Increased echogenicity. No mass or hydronephrosis visualized.  Abdominal aorta: No aneurysm visualized.  Other findings: None.  IMPRESSION: 1. Cholecystectomy. Common bile duct measures 11.5 mm. Common bile duct prominence may be secondary to cholecystectomy. Correlate with liver function tests.  2. Increased hepatic echogenicity consistent with fatty infiltration.  3.  Echogenic kidneys consistent chronic medical renal disease.  Electronically Signed   By: Marcello Moores  Register   On: 04/25/2020 11:40 Note: Reviewed        Physical Exam  General appearance: Well nourished, well developed, and well hydrated. In no apparent acute distress Mental status: Alert, oriented x 3 (person, place, & time)       Respiratory: No evidence of acute respiratory distress Eyes: PERLA Vitals: BP (!) 168/65   Pulse 66   Temp 98.2 F (36.8 C) (Temporal)   Resp 18   Ht _0  (1.676 m)   Wt (!) 323 lb (146.5 kg)   SpO2 95%   BMI 52.13 kg/m  BMI: Estimated body mass index is 52.13 kg/m as calculated from the following:   Height as of this encounter: _1  (1.676 m).   Weight as of this encounter: 323 lb (146.5  kg). Ideal: Ideal body weight: 59.3 kg (130 lb 11.7 oz) Adjusted ideal body weight: 94.2 kg (207 lb 10.2 oz)  Lumbar Spine Area Exam  Skin & Axial Inspection: Well healed scar from previous spine surgery detected spinal cord stimulator in place with IPG present Alignment: Symmetrical Functional ROM: Pain restricted ROM affecting both sides Stability: No instability detected Muscle Tone/Strength: Functionally intact. No obvious neuro-muscular anomalies detected. Sensory (Neurological): Musculoskeletal pain pattern and dermatomal  Gait & Posture Assessment  Ambulation: Limited Gait: Significantly limited. Dependent on assistive device to ambulate Posture: Difficulty standing up straight, due to pain  Lower Extremity Exam    Side: Right lower extremity  Side: Left lower extremity  Stability: No instability observed          Stability: No instability observed          Skin & Extremity Inspection: Skin color, temperature, and hair growth are WNL. No peripheral edema or cyanosis. No masses, redness, swelling, asymmetry, or associated skin lesions. No contractures.  Skin & Extremity Inspection: Skin color, temperature, and hair growth are WNL. No peripheral edema or cyanosis. No masses, redness, swelling, asymmetry, or associated skin lesions. No contractures.  Functional ROM: Pain restricted ROM for all joints of the lower extremity          Functional ROM: Pain restricted ROM for all joints of the lower extremity          Muscle Tone/Strength: Mild-to-moderate deconditioning  Muscle Tone/Strength: Functionally intact. No obvious neuro-muscular anomalies detected.  Sensory (Neurological): Dermatomal pain pattern        Sensory (Neurological): Dermatomal pain pattern        DTR: Patellar: deferred today Achilles: deferred today Plantar: deferred today  DTR: Patellar: deferred today Achilles: deferred today Plantar: deferred today  Palpation: No palpable anomalies  Palpation: No palpable  anomalies    Assessment   Status Diagnosis  Persistent Persistent Persistent 1. L3-4  severe lumbar facet hypertrophy and spinal stenosis   2. Chronic pain syndrome   3. L3-4 severe lumbar spinal stenosis (12/31/2016 MRI)   4. Primary osteoarthritis of left knee   5. Failed back surgical syndrome   6. Spinal cord stimulator status ([place])   7. Status post lumbar spine surgery for decompression of spinal cord   8. Primary osteoarthritis of right knee   9. 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 Bilateral sacroiliitis, asymmetric.      b. Peripheral arthropathy.      c. Plantar fasciitis.      d. Increased low back pain 2003-4; degenerative changes seen on imaging.       e. Psoriasis with flare 2005.     Morbid Obesity With Bmi of 40.0-44.9, Adult ([facility])  DM (Diabetes Mellitus) Type II Uncontrolled, Periph Vascular Disorder ([facility])  Osa (Obstructive Sleep Apnea)    Plan of Care   Brittney [name] has a current medication list which includes the following long-term medication(s): albuterol, carvedilol, doxazosin,  escitalopram, fluticasone, gabapentin, hydralazine, insulin detemir, insulin pump, insulin regular human concentrated, metoclopramide, metolazone, pantoprazole, potassium chloride, pramipexole, tramadol, trazodone, and zolpidem.  Pharmacotherapy (Medications Ordered): Meds ordered this encounter  Medications  . oxyCODONE-acetaminophen (PERCOCET) 10-325 MG tablet    Sig: Take 1 tablet by mouth every 12 (twelve) hours as needed for pain. Must last 30 days.    Dispense:  60 tablet    Refill:  0    Chronic Pain. (STOP Act - Not applicable). Fill one day early if closed on scheduled refill date.  Marland Kitchen oxyCODONE-acetaminophen (PERCOCET) 10-325 MG tablet    Sig: Take 1 tablet by mouth every 12 (twelve) hours as needed for pain. Must last 30 days.    Dispense:  60 tablet    Refill:  0    Chronic Pain. (STOP Act - Not applicable). Fill one day early if closed on scheduled refill date.   Follow-up plan:   Return in about 3 months (around 09/24/2020) for Medication Management, in person.     s/p GNB (R) 07/18/19-helpful, provided greater than 50% pain relief for 5 days however patient did sustain a fall due to UTI associated sepsis did spend 3 days in the hospital.  Prior to her fall she was obtaining significant pain relief with genicular nerve block.  Status post right genicular RFA on 08/29/2019.  Left GN RFA on 02/12/19.       Recent Visits No visits were found meeting these conditions. Showing recent visits within past 90 days and meeting all other requirements Today's Visits Date Type Provider Dept  06/24/20 Office Visit [name], MD [facility]  Showing today's visits and meeting all other requirements Future Appointments No visits were found meeting these conditions. Showing future appointments within next 90 days and meeting all other requirements  I discussed the assessment and treatment plan with the patient. The patient was provided an opportunity to ask questions and all were  answered. The patient agreed with the plan and demonstrated an understanding of the instructions.  Patient advised to call back or seek an in-person evaluation if the symptoms or condition worsens.  Duration of encounter: 30 minutes.  Note by: [name], MD Date: 06/24/2020; Time: 3:44 PM

## 2020-07-07 ENCOUNTER — Other Ambulatory Visit: Payer: Self-pay | Admitting: Student

## 2020-07-07 DIAGNOSIS — R0602 Shortness of breath: Secondary | ICD-10-CM

## 2020-07-07 DIAGNOSIS — R079 Chest pain, unspecified: Secondary | ICD-10-CM

## 2020-07-16 ENCOUNTER — Other Ambulatory Visit
Admission: RE | Admit: 2020-07-16 | Discharge: 2020-07-16 | Disposition: A | Discharge status: Home / Self Care | Payer: Medicare Other | Source: Ambulatory Visit | Attending: Gastroenterology | Admitting: Gastroenterology

## 2020-07-16 ENCOUNTER — Other Ambulatory Visit: Payer: Self-pay

## 2020-07-16 DIAGNOSIS — Z20822 Contact with and (suspected) exposure to covid-19: Secondary | ICD-10-CM | POA: Insufficient documentation

## 2020-07-16 DIAGNOSIS — Z01812 Encounter for preprocedural laboratory examination: Secondary | ICD-10-CM | POA: Diagnosis present

## 2020-07-17 ENCOUNTER — Encounter: Payer: Self-pay | Admitting: Internal Medicine

## 2020-07-17 LAB — SARS CORONAVIRUS 2 (TAT 6-24 HRS): SARS Coronavirus 2: NEGATIVE

## 2020-07-18 ENCOUNTER — Encounter: Payer: Self-pay | Admitting: Internal Medicine

## 2020-07-18 ENCOUNTER — Other Ambulatory Visit: Payer: Self-pay

## 2020-07-18 ENCOUNTER — Ambulatory Visit: Payer: Medicare Other | Admitting: Certified Registered"

## 2020-07-18 ENCOUNTER — Encounter
Admission: RE | Disposition: A | Discharge status: Home / Self Care | Payer: Self-pay | Source: Ambulatory Visit | Attending: Gastroenterology

## 2020-07-18 ENCOUNTER — Ambulatory Visit
Admission: RE | Admit: 2020-07-18 | Discharge: 2020-07-18 | Disposition: A | Discharge status: Home / Self Care | Payer: Medicare Other | Source: Ambulatory Visit | Attending: Gastroenterology | Admitting: Gastroenterology

## 2020-07-18 DIAGNOSIS — I2510 Atherosclerotic heart disease of native coronary artery without angina pectoris: Secondary | ICD-10-CM | POA: Diagnosis not present

## 2020-07-18 DIAGNOSIS — I129 Hypertensive chronic kidney disease with stage 1 through stage 4 chronic kidney disease, or unspecified chronic kidney disease: Secondary | ICD-10-CM | POA: Diagnosis not present

## 2020-07-18 DIAGNOSIS — K2970 Gastritis, unspecified, without bleeding: Secondary | ICD-10-CM | POA: Diagnosis not present

## 2020-07-18 DIAGNOSIS — E039 Hypothyroidism, unspecified: Secondary | ICD-10-CM | POA: Insufficient documentation

## 2020-07-18 DIAGNOSIS — J45909 Unspecified asthma, uncomplicated: Secondary | ICD-10-CM | POA: Diagnosis not present

## 2020-07-18 DIAGNOSIS — Z79899 Other long term (current) drug therapy: Secondary | ICD-10-CM | POA: Diagnosis not present

## 2020-07-18 DIAGNOSIS — K7460 Unspecified cirrhosis of liver: Secondary | ICD-10-CM | POA: Insufficient documentation

## 2020-07-18 DIAGNOSIS — E1143 Type 2 diabetes mellitus with diabetic autonomic (poly)neuropathy: Secondary | ICD-10-CM | POA: Diagnosis not present

## 2020-07-18 DIAGNOSIS — Z79890 Hormone replacement therapy (postmenopausal): Secondary | ICD-10-CM | POA: Diagnosis not present

## 2020-07-18 DIAGNOSIS — G4730 Sleep apnea, unspecified: Secondary | ICD-10-CM | POA: Insufficient documentation

## 2020-07-18 DIAGNOSIS — Z794 Long term (current) use of insulin: Secondary | ICD-10-CM | POA: Diagnosis not present

## 2020-07-18 DIAGNOSIS — E785 Hyperlipidemia, unspecified: Secondary | ICD-10-CM | POA: Insufficient documentation

## 2020-07-18 DIAGNOSIS — Z6841 Body Mass Index (BMI) 40.0 and over, adult: Secondary | ICD-10-CM | POA: Diagnosis not present

## 2020-07-18 DIAGNOSIS — K317 Polyp of stomach and duodenum: Secondary | ICD-10-CM | POA: Diagnosis not present

## 2020-07-18 DIAGNOSIS — J449 Chronic obstructive pulmonary disease, unspecified: Secondary | ICD-10-CM | POA: Diagnosis not present

## 2020-07-18 DIAGNOSIS — Z9981 Dependence on supplemental oxygen: Secondary | ICD-10-CM | POA: Insufficient documentation

## 2020-07-18 DIAGNOSIS — M1990 Unspecified osteoarthritis, unspecified site: Secondary | ICD-10-CM | POA: Insufficient documentation

## 2020-07-18 DIAGNOSIS — N182 Chronic kidney disease, stage 2 (mild): Secondary | ICD-10-CM | POA: Diagnosis not present

## 2020-07-18 DIAGNOSIS — Z85820 Personal history of malignant melanoma of skin: Secondary | ICD-10-CM | POA: Diagnosis not present

## 2020-07-18 DIAGNOSIS — E1122 Type 2 diabetes mellitus with diabetic chronic kidney disease: Secondary | ICD-10-CM | POA: Insufficient documentation

## 2020-07-18 HISTORY — DX: Personal history of urinary calculi: Z87.442

## 2020-07-18 HISTORY — PX: ESOPHAGOGASTRODUODENOSCOPY (EGD) WITH PROPOFOL: SHX5813

## 2020-07-18 SURGERY — ESOPHAGOGASTRODUODENOSCOPY (EGD) WITH PROPOFOL
Anesthesia: General

## 2020-07-18 MED ORDER — LIDOCAINE HCL (PF) 2 % IJ SOLN
INTRAMUSCULAR | Status: AC
  Filled 2020-07-18: qty 5

## 2020-07-18 MED ORDER — PROPOFOL 10 MG/ML IV BOLUS
INTRAVENOUS | Status: AC
  Filled 2020-07-18: qty 40

## 2020-07-18 MED ORDER — PROPOFOL 10 MG/ML IV BOLUS
INTRAVENOUS | Status: DC | PRN
  Administered 2020-07-18: 50 mg via INTRAVENOUS
  Administered 2020-07-18: 100 mg via INTRAVENOUS

## 2020-07-18 MED ORDER — LIDOCAINE HCL (CARDIAC) PF 100 MG/5ML IV SOSY
PREFILLED_SYRINGE | INTRAVENOUS | Status: DC | PRN
  Administered 2020-07-18: 70 mg via INTRAVENOUS

## 2020-07-18 MED ORDER — SODIUM CHLORIDE 0.9 % IV SOLN
INTRAVENOUS | Status: DC
  Administered 2020-07-18: 1000 mL via INTRAVENOUS

## 2020-07-18 NOTE — Interval H&P Note (Signed)
History and Physical Interval Note:  07/18/2020 11:03 AM  Brittney [name]  has presented today for surgery, with the diagnosis of CIRRHOSIS,IDA.  The various methods of treatment have been discussed with the patient and family. After consideration of risks, benefits and other options for treatment, the patient has consented to  Procedure(s): ESOPHAGOGASTRODUODENOSCOPY (EGD) WITH PROPOFOL (N/A) as a surgical intervention.  The patient's history has been reviewed, patient examined, no change in status, stable for surgery.  I have reviewed the patient's chart and labs.  Questions were answered to the patient's satisfaction.     Lesly Rubenstein  Ok to proceed with EGD.

## 2020-07-18 NOTE — Anesthesia Postprocedure Evaluation (Signed)
Anesthesia Post Note  Patient: Brittney [name]  Procedure(s) Performed: ESOPHAGOGASTRODUODENOSCOPY (EGD) WITH PROPOFOL (N/A )  Patient location during evaluation: Endoscopy Anesthesia Type: General Level of consciousness: awake and alert Pain management: pain level controlled Vital Signs Assessment: post-procedure vital signs reviewed and stable Respiratory status: spontaneous breathing, nonlabored ventilation, respiratory function stable and patient connected to nasal cannula oxygen Cardiovascular status: blood pressure returned to baseline and stable Postop Assessment: no apparent nausea or vomiting Anesthetic complications: no   No complications documented.   Last Vitals:  Vitals:   07/18/20 1148 07/18/20 1158  BP: (!) 185/76 (!) 163/73  Pulse: 73 73  Resp: 17 17  Temp:    SpO2: 98% 98%    Last Pain:  Vitals:   07/18/20 1158  TempSrc:   PainSc: 0-No pain                 Martha Clan

## 2020-07-18 NOTE — Op Note (Addendum)
[facility] Patient Name: Brittney [name] Procedure Date: 07/18/2020 10:49 AM MRN: [id number] Account #: [id number] Date of Birth: Jun 15, 1951 Admit Type: Outpatient Age: 69 Room: [facility] ENDO ROOM 3 Gender: Female Note Status: Supervisor Override Procedure:             Upper GI endoscopy Indications:           Iron deficiency anemia Providers:             Andrey Farmer MD, MD Medicines:             Monitored Anesthesia Care Complications:         No immediate complications. Estimated blood loss:                         Minimal. Procedure:             Pre-Anesthesia Assessment:                        - Prior to the procedure, a History and Physical was                         performed, and patient medications and allergies were                         reviewed. The patient is competent. The risks and                         benefits of the procedure and the sedation options and                         risks were discussed with the patient. All questions                         were answered and informed consent was obtained.                         Patient identification and proposed procedure were                         verified by the physician, the nurse, the anesthetist                         and the technician in the endoscopy suite. Mental                         Status Examination: alert and oriented. Airway                         Examination: normal oropharyngeal airway and neck                         mobility. Respiratory Examination: clear to                         auscultation. CV Examination: normal. Prophylactic                         Antibiotics: The patient does not require prophylactic  antibiotics. Prior Anticoagulants: The patient has                         taken no previous anticoagulant or antiplatelet                         agents. ASA Grade Assessment: III - A patient with                          severe systemic disease. After reviewing the risks and                         benefits, the patient was deemed in satisfactory                         condition to undergo the procedure. The anesthesia                         plan was to use monitored anesthesia care (MAC).                         Immediately prior to administration of medications,                         the patient was re-assessed for adequacy to receive                         sedatives. The heart rate, respiratory rate, oxygen                         saturations, blood pressure, adequacy of pulmonary                         ventilation, and response to care were monitored                         throughout the procedure. The physical status of the                         patient was re-assessed after the procedure.                        After obtaining informed consent, the endoscope was                         passed under direct vision. Throughout the procedure,                         the patient's blood pressure, pulse, and oxygen                         saturations were monitored continuously. The Endoscope                         was introduced through the mouth, and advanced to the                         second part of duodenum. The upper GI endoscopy was  technically difficult and complex due to the patient's                         body habitus. The patient tolerated the procedure well. Findings:      The examined esophagus was normal.      There is no endoscopic evidence of varices in the entire esophagus.      Multiple 2 to 8 mm sessile polyps with no stigmata of recent bleeding       were found in the gastric fundus and in the gastric body. These       resembled fundic gland polyps. Two poylps were sampled with a jumbo cold       forceps. Resection and retrieval were complete. Estimated blood loss was       minimal.      Localized mild inflammation was found in the gastric antrum.  Biopsies       were taken with a cold forceps for Helicobacter pylori testing.       Estimated blood loss was minimal.      The examined duodenum was normal. Impression:            - Normal esophagus.                        - Multiple gastric polyps. Resected and retrieved.                        - Gastritis. Biopsied.                        - Normal examined duodenum. Recommendation:        - Discharge patient to home.                        - Resume previous diet.                        - Continue present medications.                        - Await pathology results.                        - Return to referring physician as previously                         scheduled. Procedure Code(s):     --- Professional ---                        [phone], Esophagogastroduodenoscopy, flexible,                         transoral; with biopsy, single or multiple Diagnosis Code(s):     --- Professional ---                        K31.7, Polyp of stomach and duodenum                        K29.70, Gastritis, unspecified, without bleeding                        Z13.810, Encounter  for screening for upper                         gastrointestinal disorder                        D50.9, Iron deficiency anemia, unspecified CPT copyright 2019 [employer]. All rights reserved. The codes documented in this report are preliminary and upon coder review may  be revised to meet current compliance requirements. Andrey Farmer, MD Andrey Farmer MD, MD 07/18/2020 11:29:43 AM Number of Addenda: 0 Note Initiated On: 07/18/2020 10:49 AM Estimated Blood Loss:  Estimated blood loss was minimal.      [facility]

## 2020-07-18 NOTE — Anesthesia Preprocedure Evaluation (Signed)
Anesthesia Evaluation  Patient identified by MRN, date of birth, ID band Patient awake    Reviewed: Allergy & Precautions, NPO status , Patient's Chart, lab work & pertinent test results, reviewed documented beta blocker date and time   History of Anesthesia Complications Negative for: history of anesthetic complications  Airway Mallampati: II  TM Distance: >3 FB Neck ROM: Full    Dental  (+) Partial Lower, Poor Dentition, Dental Advidsory Given   Pulmonary neg shortness of breath, asthma (mild intermittent, uses albuterol ~2x per week) , sleep apnea (not using CPAP) , COPD,  COPD inhaler and oxygen dependent, neg recent URI,    breath sounds clear to auscultation- rhonchi (-) wheezing      Cardiovascular hypertension, Pt. on medications and Pt. on home beta blockers (-) angina+ CAD, + Past MI and + Peripheral Vascular Disease  (-) Cardiac Stents + dysrhythmias (-) Valvular Problems/Murmurs Rhythm:Regular Rate:Normal - Systolic murmurs and - Diastolic murmurs    Neuro/Psych neg Seizures  Neuromuscular disease negative psych ROS   GI/Hepatic GERD  ,(+) Cirrhosis  (NAFLD)      , NAFLD   Endo/Other  diabetes, Insulin DependentHypothyroidism Morbid obesity  Renal/GU CRFRenal disease     Musculoskeletal  (+) Arthritis ,   Abdominal (+) + obese,   Peds  Hematology negative hematology ROS (+)   Anesthesia Other Findings Past Medical History: No date: Arthritis No date: Asthma No date: Cancer ([place])     Comment: melanoma No date: Collagen vascular disease ([place]) No date: Diabetes mellitus without complication ([place]) No date: Diabetic nephropathy associated with secondary* No date: Gastroparesis No date: Hepatic cirrhosis (H[facility]) No date: Hyperlipidemia No date: Hypertension No date: Morbid obesity with BMI of 40.0-44.9, adult ([facility]* No date: Nephrolithiasis No date: Nonproliferative retinopathy due to secondary * No  date: Peripheral vascular disease (HCC) No date: Sciatica No date: Sleep apnea No date: Unilateral small kidney without contralateral *   Reproductive/Obstetrics                             Anesthesia Physical  Anesthesia Plan  ASA: IV  Anesthesia Plan: General   Post-op Pain Management:    Induction: Intravenous  PONV Risk Score and Plan: 3 and Propofol infusion and TIVA  Airway Management Planned: Natural Airway and Nasal Cannula  Additional Equipment:   Intra-op Plan:   Post-operative Plan:   Informed Consent: I have reviewed the patients History and Physical, chart, labs and discussed the procedure including the risks, benefits and alternatives for the proposed anesthesia with the patient or authorized representative who has indicated his/her understanding and acceptance.     Dental advisory given  Plan Discussed with: CRNA and Anesthesiologist  Anesthesia Plan Comments:         Anesthesia Quick Evaluation

## 2020-07-18 NOTE — Anesthesia Procedure Notes (Signed)
Anesthesia Procedure Note 4%  Lido topicalized via atomizer to oropharynx pre-procedure

## 2020-07-18 NOTE — H&P (Signed)
Outpatient short stay form Pre-procedure 07/18/2020 11:01 AM Brittney Miyamoto MD, MPH  Primary Physician: Dr. Sabra Heck  Reason for visit:  Variceal Screening  History of present illness:  69 y/o lady with likely NASH cirrhosis here for variceal screening, well compensated. No blood thinners.    Current Facility-Administered Medications:  .  0.9 %  sodium chloride infusion, , Intravenous, Continuous, Brittney [name], Brittney Cork, MD, Last Rate: 20 mL/hr at 07/18/20 1100, 1,000 mL at 07/18/20 1100  Medications Prior to Admission  Medication Sig Dispense Refill Last Dose  . ALPRAZolam (XANAX) 0.5 MG tablet Take 0.5 mg by mouth 4 (four) times daily.    Past Week at Unknown time  . butalbital-acetaminophen-caffeine (FIORICET, ESGIC) 50-325-40 MG tablet Take 1 tablet by mouth 2 (two) times daily as needed for migraine.    Past Week at Unknown time  . carvedilol (COREG) 25 MG tablet Take 50 mg by mouth 2 (two) times daily with a meal.    07/17/2020 at Unknown time  . cloNIDine (CATAPRES - DOSED IN MG/24 HR) 0.3 mg/24hr patch Place 0.3 mg onto the skin once a week.    07/17/2020 at Unknown time  . cyclobenzaprine (FLEXERIL) 10 MG tablet Take 20 mg by mouth at bedtime.   Past Week at Unknown time  . doxazosin (CARDURA) 4 MG tablet Take 4 mg by mouth every evening.    07/17/2020 at Unknown time  . escitalopram (LEXAPRO) 10 MG tablet Take 10 mg by mouth daily.    07/17/2020 at Unknown time  . fluticasone (FLONASE) 50 MCG/ACT nasal spray Place 2 sprays into both nostrils as needed.    Past Week at Unknown time  . furosemide (LASIX) 40 MG tablet Take 40 mg by mouth daily.    07/17/2020 at Unknown time  . gabapentin (NEURONTIN) 600 MG tablet Take 3,000 mg by mouth at bedtime. Take 1 tablet (600mg ) by mouth twice daily and take 3 tablets (1800mg ) by mouth at bedtime   07/17/2020 at Unknown time  . hydrALAZINE (APRESOLINE) 50 MG tablet Take 50 mg by mouth 3 (three) times daily.    07/17/2020 at Unknown time  . ibuprofen  (ADVIL,MOTRIN) 800 MG tablet Take 800 mg by mouth 3 (three) times daily as needed for mild pain or moderate pain.    Past Week at Unknown time  . Insulin Detemir (LEVEMIR FLEXTOUCH) 100 UNIT/ML Pen Inject 140 Units into the skin 2 (two) times daily.    07/17/2020 at Unknown time  . Insulin Human (INSULIN PUMP) SOLN Inject into the skin.   07/18/2020 at Unknown time  . levothyroxine (SYNTHROID) 200 MCG tablet Take 200 mcg by mouth daily before breakfast.   07/17/2020 at Unknown time  . LINZESS 290 MCG CAPS capsule Take 290 mcg by mouth daily.   Past Week at Unknown time  . metoCLOPramide (REGLAN) 10 MG tablet Take 10 mg by mouth 3 (three) times daily.   07/17/2020 at Unknown time  . metolazone (ZAROXOLYN) 5 MG tablet Take 5 mg by mouth 2 (two) times a week.   Past Month at Unknown time  . ondansetron (ZOFRAN) 4 MG tablet Take 4 mg by mouth every 12 (twelve) hours as needed for nausea or vomiting.    07/17/2020 at Unknown time  . oxyCODONE-acetaminophen (PERCOCET) 10-325 MG tablet Take 1 tablet by mouth every 12 (twelve) hours as needed for pain. Must last 30 days. 60 tablet 0 07/17/2020 at Unknown time  . pantoprazole (PROTONIX) 40 MG tablet Take 40 mg by mouth  2 (two) times daily.    07/17/2020 at Unknown time  . potassium chloride (K-DUR) 10 MEQ tablet Take 10 mEq by mouth daily.   07/17/2020 at Unknown time  . pramipexole (MIRAPEX) 0.5 MG tablet Take 0.5 mg by mouth 2 (two) times daily.    07/17/2020 at Unknown time  . traMADol (ULTRAM) 50 MG tablet Take 100 mg by mouth at bedtime as needed.   3 07/17/2020 at Unknown time  . traZODone (DESYREL) 150 MG tablet Take 150 mg by mouth at bedtime.    07/17/2020 at Unknown time  . zolpidem (AMBIEN) 10 MG tablet Take 10 mg by mouth at bedtime.    Past Week at Unknown time  . albuterol (VENTOLIN HFA) 108 (90 Base) MCG/ACT inhaler Inhale 2 puffs into the lungs 3 (three) times daily as needed for wheezing or shortness of breath.     at prn  . insulin regular human  CONCENTRATED (HUMULIN R) 500 UNIT/ML kwikpen Inject 40-80 Units into the skin See admin instructions. Inject 80u under the skin at breakfast, inject 65-70u under the skin at lunch and inject 40-80u under the skin at dinner time based on blood glucose readings (Patient not taking: Reported on 07/18/2020)   Not Taking at Unknown time  . nitroGLYCERIN (NITRODUR - DOSED IN MG/24 HR) 0.4 mg/hr patch Place onto the skin. (Patient not taking: Reported on 06/24/2020)     . [START ON 07/28/2020] oxyCODONE-acetaminophen (PERCOCET) 10-325 MG tablet Take 1 tablet by mouth every 12 (twelve) hours as needed for pain. Must last 30 days. 60 tablet 0   . [START ON 08/27/2020] oxyCODONE-acetaminophen (PERCOCET) 10-325 MG tablet Take 1 tablet by mouth every 12 (twelve) hours as needed for pain. Must last 30 days. 60 tablet 0   . rosuvastatin (CRESTOR) 20 MG tablet Take 20 mg by mouth daily. (Patient not taking: Reported on 06/24/2020)        Allergies  Allergen Reactions  . Eggs Or Egg-Derived Products Nausea And Vomiting    Only has reaction to flu vaccine due to eggs, no reaction to egg food products  . Shellfish Allergy Nausea And Vomiting, Diarrhea and Nausea Only    Non stop sickness once ingests shellfish. Betadine is okay  . Amlodipine Cough  . Codeine Nausea And Vomiting and Nausea Only    states she can take codeine cough syrup without problems states she can take codeine cough syrup without problems  . Imdur [Isosorbide Dinitrate] Other (See Comments)    Headache  . Lyrica [Pregabalin] Swelling and Other (See Comments)    "FLU-LIKE" SYMPTOMS  . Monosodium Glutamate Other (See Comments)    Headache  . Tape Other (See Comments) and Nausea And Vomiting    can use paper tape Redness     Past Medical History:  Diagnosis Date  . Abdominal pain, left upper quadrant 12/11/2012  . Arthritis   . Asthma   . Broken leg 2014   fell twice and broke same leg. had a bone stimulator for first break.  . Cancer  ([place]) 2007   melanoma, left ear and back of left leg, removed from back  . Chronic kidney disease, stage 2 (mild)   . Chronic lower back pain   . Collagen vascular disease ([place])   . Coronary artery disease   . Diabetes mellitus without complication ([place])   . Diabetic nephropathy associated with secondary diabetes mellitus ([place])   . [place] 12/11/2012  . Gastroparesis   . Gross hematuria 12/11/2012  .  Hepatic cirrhosis ([place])   . History of kidney stones   . Hyperlipidemia   . Hypertension   . Hypothyroidism   . Hypothyroidism   . IBS (irritable bowel syndrome)   . Kidney stone 12/11/2012  . Lower extremity edema   . Morbid obesity with BMI of 40.0-44.9, adult ([place])   . Myocardial infarction ([place]) 09/2013  . Nausea without vomiting 12/11/2012  . Nephrolithiasis   . Nephrolithiasis 04/26/2014  . Nonproliferative retinopathy due to secondary diabetes ([place])   . Numbness and tingling of right leg   . Peripheral vascular disease ([place])   . Renal colic 33/43/5686  . Renal insufficiency   . Sciatica   . Sciatica 12/11/2012  . Skin cancer 08/2018  . Sleep apnea    sleep study coming in november  . Unilateral small kidney without contralateral hypertrophy     Review of systems:  Otherwise negative.    Physical Exam  Gen: Alert, oriented. Appears stated age.  HEENT: PERRLA. Lungs: no respiratory distress CV: RR nl S1, S2. Abd: soft, benign, no masses Ext: No edema. Pulses 2+    Planned procedures: Proceed with EGD. The patient understands the nature of the planned procedure, indications, risks, alternatives and potential complications including but not limited to bleeding, infection, perforation, damage to internal organs and possible oversedation/side effects from anesthesia. The patient agrees and gives consent to proceed.  Please refer to procedure notes for findings, recommendations and patient disposition/instructions.     Brittney Miyamoto MD,  [facility] 07/18/2020  11:01 AM

## 2020-07-18 NOTE — Transfer of Care (Signed)
Immediate Anesthesia Transfer of Care Note  Patient: Brittney [name]  Procedure(s) Performed: ESOPHAGOGASTRODUODENOSCOPY (EGD) WITH PROPOFOL (N/A )  Patient Location: PACU and Endoscopy Unit  Anesthesia Type:General  Level of Consciousness: awake and patient cooperative  Airway & Oxygen Therapy: Patient Spontanous Breathing and Patient connected to face mask oxygen  Post-op Assessment: Report given to RN and Post -op Vital signs reviewed and stable  Post vital signs: Reviewed and stable  Last Vitals:  Vitals Value Taken Time  BP    Temp    Pulse    Resp    SpO2      Last Pain:  Vitals:   07/18/20 1024  TempSrc: Temporal  PainSc: 6          Complications: No complications documented.

## 2020-07-21 ENCOUNTER — Encounter: Payer: Self-pay | Admitting: Gastroenterology

## 2020-07-21 LAB — SURGICAL PATHOLOGY

## 2020-07-22 ENCOUNTER — Other Ambulatory Visit: Payer: Self-pay | Admitting: Family Medicine

## 2020-07-22 ENCOUNTER — Other Ambulatory Visit: Payer: Self-pay

## 2020-07-22 ENCOUNTER — Ambulatory Visit
Admission: RE | Admit: 2020-07-22 | Discharge: 2020-07-22 | Disposition: A | Discharge status: Home / Self Care | Payer: Medicare Other | Source: Ambulatory Visit | Attending: Family Medicine | Admitting: Family Medicine

## 2020-07-22 DIAGNOSIS — H532 Diplopia: Secondary | ICD-10-CM | POA: Diagnosis not present

## 2020-07-22 LAB — POCT I-STAT CREATININE: Creatinine, Ser: 0.9 mg/dL (ref 0.44–1.00)

## 2020-07-22 MED ORDER — IOHEXOL 350 MG/ML SOLN
75.0000 mL | Freq: Once | INTRAVENOUS | Status: AC | PRN
  Administered 2020-07-22: 75 mL via INTRAVENOUS

## 2020-07-23 ENCOUNTER — Ambulatory Visit: Admission: RE | Admit: 2020-07-23 | Payer: Medicare Other | Source: Ambulatory Visit

## 2020-07-23 ENCOUNTER — Ambulatory Visit: Payer: Medicare Other

## 2020-07-27 DIAGNOSIS — I639 Cerebral infarction, unspecified: Secondary | ICD-10-CM

## 2020-07-27 HISTORY — DX: Cerebral infarction, unspecified: I63.9

## 2020-07-27 NOTE — Progress Notes (Deleted)
[place]  [phone] [phone]  ID: Brittney [name] OB: 05-04-1951  MR#: [id number]  BOF#:[id number]  Patient Care Team: Rusty Aus, MD as PCP - General (Internal Medicine) Lloyd Huger, MD as Consulting Physician (Hematology and Oncology)  CHIEF COMPLAINT: Iron deficiency anemia.  INTERVAL HISTORY: Patient returns to clinic today for repeat laboratory work and further evaluation.  She continues to have weakness and fatigue, but admits this has improved since receiving IV iron several months ago.  She has no neurologic complaints. She denies any recent fevers or illnesses. She has a good appetite and denies weight loss.  She denies any chest pain, shortness of breath, cough, or hemoptysis.  She has no nausea, vomiting, constipation, or diarrhea. She denies any melena or hematochezia. She has no urinary complaints.  Patient offers no further specific complaints today.  REVIEW OF SYSTEMS:   Review of Systems  Constitutional: Positive for malaise/fatigue. Negative for fever and weight loss.  Respiratory: Negative.  Negative for cough, hemoptysis and shortness of breath.   Cardiovascular: Negative.  Negative for chest pain and leg swelling.  Gastrointestinal: Negative.  Negative for abdominal pain, blood in stool and melena.  Genitourinary: Negative.  Negative for dysuria.  Musculoskeletal: Negative.  Negative for back pain.  Skin: Negative.  Negative for rash.  Neurological: Positive for weakness. Negative for sensory change, focal weakness and headaches.  Psychiatric/Behavioral: Negative.  The patient is not nervous/anxious.     As per HPI. Otherwise, a complete review of systems is negative.  PAST MEDICAL HISTORY: Past Medical History:  Diagnosis Date  . Abdominal pain, left upper quadrant 12/11/2012  . Arthritis   . Asthma   . Broken leg 2014   fell twice and broke same leg. had a bone stimulator for first break.  . Cancer  ([place]) 2007   melanoma, left ear and back of left leg, removed from back  . Chronic kidney disease, stage 2 (mild)   . Chronic lower back pain   . Collagen vascular disease ([place])   . Coronary artery disease   . Diabetes mellitus without complication ([place])   . Diabetic nephropathy associated with secondary diabetes mellitus ([place])   . Flushing 12/11/2012  . Gastroparesis   . Gross hematuria 12/11/2012  . Hepatic cirrhosis ([place])   . History of kidney stones   . Hyperlipidemia   . Hypertension   . Hypothyroidism   . Hypothyroidism   . IBS (irritable bowel syndrome)   . Kidney stone 12/11/2012  . Lower extremity edema   . Morbid obesity with BMI of 40.0-44.9, adult ([place])   . Myocardial infarction ([place]) 09/2013  . Nausea without vomiting 12/11/2012  . Nephrolithiasis   . Nephrolithiasis 04/26/2014  . Nonproliferative retinopathy due to secondary diabetes ([place])   . Numbness and tingling of right leg   . Peripheral vascular disease ([place])   . Renal colic 77/82/4235  . Renal insufficiency   . Sciatica   . Sciatica 12/11/2012  . Skin cancer 08/2018  . Sleep apnea    sleep study coming in november  . Unilateral small kidney without contralateral hypertrophy     PAST SURGICAL HISTORY: Past Surgical History:  Procedure Laterality Date  . ABDOMINAL HYSTERECTOMY    . BACK SURGERY  04/1999   L-4-5 LAMINECTOMY. metal in lower back and neck  . CESAREAN SECTION  1980  . CHOLECYSTECTOMY    . COLONOSCOPY  05/04/2001  . COLONOSCOPY WITH PROPOFOL N/A 09/20/2016   Procedure: COLONOSCOPY  WITH PROPOFOL;  Surgeon: Lollie Sails, MD;  Location: [facility] ENDOSCOPY;  Service: Endoscopy;  Laterality: N/A;  . DILATION AND CURETTAGE OF UTERUS    . ESOPHAGOGASTRODUODENOSCOPY  02/08/2014  . ESOPHAGOGASTRODUODENOSCOPY (EGD) WITH PROPOFOL N/A 09/20/2016   Procedure: ESOPHAGOGASTRODUODENOSCOPY (EGD) WITH PROPOFOL;  Surgeon: Lollie Sails, MD;  Location[facility] ENDOSCOPY;  Service: Endoscopy;  Laterality:  N/A;  . ESOPHAGOGASTRODUODENOSCOPY (EGD) WITH PROPOFOL N/A 05/23/2018   Procedure: ESOPHAGOGASTRODUODENOSCOPY (EGD) WITH PROPOFOL;  Surgeon: Lollie Sails, MD;  Location: [facility] ENDOSCOPY;  Service: Endoscopy;  Laterality: N/A;  . ESOPHAGOGASTRODUODENOSCOPY (EGD) WITH PROPOFOL N/A 07/18/2020   Procedure: ESOPHAGOGASTRODUODENOSCOPY (EGD) WITH PROPOFOL;  Surgeon: Lesly Rubenstein, MD;  Location: [facility] ENDOSCOPY;  Service: Gastroenterology;  Laterality: N/A;  . EYE SURGERY  1986   FOR MELANOMA  . HERNIA REPAIR    . JOINT REPLACEMENT Left 09/24/2013   TOTAL KNEE  . MOHS SURGERY  11/2016   left side of nose  . PULSE GENERATOR IMPLANT N/A 11/02/2017   Procedure: UNILATERAL PULSE GENERATOR IMPLANT;  Surgeon: Meade Maw, MD;  Location: [facility] ORS;  Service: Neurosurgery;  Laterality: N/A;  . TONSILLECTOMY     WITH ADENOIDECTOMY    FAMILY HISTORY: Family History  Problem Relation Age of Onset  . Heart disease Mother   . Cancer Mother   . Asthma Mother   . Diabetes Father   . Kidney disease Father   . Hypertension Father   . Breast cancer Neg Hx     ADVANCED DIRECTIVES (Y/N):  N  HEALTH MAINTENANCE: Social History   Tobacco Use  . Smoking status: Never Smoker  . Smokeless tobacco: Never Used  Vaping Use  . Vaping Use: Never used  Substance Use Topics  . Alcohol use: No  . Drug use: Never     Colonoscopy:  PAP:  Bone density:  Lipid panel:  Allergies  Allergen Reactions  . Eggs Or Egg-Derived Products Nausea And Vomiting    Only has reaction to flu vaccine due to eggs, no reaction to egg food products  . Shellfish Allergy Nausea And Vomiting, Diarrhea and Nausea Only    Non stop sickness once ingests shellfish. Betadine is okay  . Amlodipine Cough  . Codeine Nausea And Vomiting and Nausea Only    states she can take codeine cough syrup without problems states she can take codeine cough syrup without problems  . Imdur [Isosorbide Dinitrate] Other (See Comments)     Headache  . Lyrica [Pregabalin] Swelling and Other (See Comments)    "FLU-LIKE" SYMPTOMS  . Monosodium Glutamate Other (See Comments)    Headache  . Tape Other (See Comments) and Nausea And Vomiting    can use paper tape Redness    Current Outpatient Medications  Medication Sig Dispense Refill  . albuterol (VENTOLIN HFA) 108 (90 Base) MCG/ACT inhaler Inhale 2 puffs into the lungs 3 (three) times daily as needed for wheezing or shortness of breath.     . ALPRAZolam (XANAX) 0.5 MG tablet Take 0.5 mg by mouth 4 (four) times daily.     . butalbital-acetaminophen-caffeine (FIORICET, ESGIC) 50-325-40 MG tablet Take 1 tablet by mouth 2 (two) times daily as needed for migraine.     . carvedilol (COREG) 25 MG tablet Take 50 mg by mouth 2 (two) times daily with a meal.     . cloNIDine (CATAPRES - DOSED IN MG/24 HR) 0.3 mg/24hr patch Place 0.3 mg onto the skin once a week.     . cyclobenzaprine (FLEXERIL) 10  MG tablet Take 20 mg by mouth at bedtime.    Marland Kitchen doxazosin (CARDURA) 4 MG tablet Take 4 mg by mouth every evening.     . escitalopram (LEXAPRO) 10 MG tablet Take 10 mg by mouth daily.     . fluticasone (FLONASE) 50 MCG/ACT nasal spray Place 2 sprays into both nostrils as needed.     . furosemide (LASIX) 40 MG tablet Take 40 mg by mouth daily.     Marland Kitchen gabapentin (NEURONTIN) 600 MG tablet Take 3,000 mg by mouth at bedtime. Take 1 tablet (600mg ) by mouth twice daily and take 3 tablets (1800mg ) by mouth at bedtime    . hydrALAZINE (APRESOLINE) 50 MG tablet Take 50 mg by mouth 3 (three) times daily.     Marland Kitchen ibuprofen (ADVIL,MOTRIN) 800 MG tablet Take 800 mg by mouth 3 (three) times daily as needed for mild pain or moderate pain.     . Insulin Detemir (LEVEMIR FLEXTOUCH) 100 UNIT/ML Pen Inject 140 Units into the skin 2 (two) times daily.     . Insulin Human (INSULIN PUMP) SOLN Inject into the skin.    Marland Kitchen insulin regular human CONCENTRATED (HUMULIN R) 500 UNIT/ML kwikpen Inject 40-80 Units into the skin  See admin instructions. Inject 80u under the skin at breakfast, inject 65-70u under the skin at lunch and inject 40-80u under the skin at dinner time based on blood glucose readings (Patient not taking: Reported on 07/18/2020)    . levothyroxine (SYNTHROID) 200 MCG tablet Take 200 mcg by mouth daily before breakfast.    . LINZESS 290 MCG CAPS capsule Take 290 mcg by mouth daily.    . metoCLOPramide (REGLAN) 10 MG tablet Take 10 mg by mouth 3 (three) times daily.    . metolazone (ZAROXOLYN) 5 MG tablet Take 5 mg by mouth 2 (two) times a week.    . nitroGLYCERIN (NITRODUR - DOSED IN MG/24 HR) 0.4 mg/hr patch Place onto the skin. (Patient not taking: Reported on 06/24/2020)    . ondansetron (ZOFRAN) 4 MG tablet Take 4 mg by mouth every 12 (twelve) hours as needed for nausea or vomiting.     Derrill Memo ON 07/28/2020] oxyCODONE-acetaminophen (PERCOCET) 10-325 MG tablet Take 1 tablet by mouth every 12 (twelve) hours as needed for pain. Must last 30 days. 60 tablet 0  . [START ON 08/27/2020] oxyCODONE-acetaminophen (PERCOCET) 10-325 MG tablet Take 1 tablet by mouth every 12 (twelve) hours as needed for pain. Must last 30 days. 60 tablet 0  . pantoprazole (PROTONIX) 40 MG tablet Take 40 mg by mouth 2 (two) times daily.     . potassium chloride (K-DUR) 10 MEQ tablet Take 10 mEq by mouth daily.    . pramipexole (MIRAPEX) 0.5 MG tablet Take 0.5 mg by mouth 2 (two) times daily.     . rosuvastatin (CRESTOR) 20 MG tablet Take 20 mg by mouth daily. (Patient not taking: Reported on 06/24/2020)    . traMADol (ULTRAM) 50 MG tablet Take 100 mg by mouth at bedtime as needed.   3  . traZODone (DESYREL) 150 MG tablet Take 150 mg by mouth at bedtime.     Marland Kitchen zolpidem (AMBIEN) 10 MG tablet Take 10 mg by mouth at bedtime.      No current facility-administered medications for this visit.    OBJECTIVE: There were no vitals filed for this visit.   There is no height or weight on file to calculate BMI.    ECOG FS:2 - Symptomatic, <50%  confined  to bed  General: Well-developed, well-nourished, no acute distress.  Sitting in a wheelchair. Eyes: Pink conjunctiva, anicteric sclera. HEENT: Normocephalic, moist mucous membranes. Lungs: No audible wheezing or coughing. Heart: Regular rate and rhythm. Abdomen: Soft, nontender, no obvious distention. Musculoskeletal: No edema, cyanosis, or clubbing. Neuro: Alert, answering all questions appropriately. Cranial nerves grossly intact. Skin: No rashes or petechiae noted. Psych: Normal affect.   LAB RESULTS:  Lab Results  Component Value Date   NA 138 08/01/2019   K 3.9 08/01/2019   CL 101 08/01/2019   CO2 29 08/01/2019   GLUCOSE 198 (H) 08/01/2019   BUN 10 08/01/2019   CREATININE 0.90 07/22/2020   CALCIUM 9.6 08/01/2019   PROT 7.2 07/30/2019   ALBUMIN 3.3 (L) 07/30/2019   AST 39 07/30/2019   ALT 34 07/30/2019   ALKPHOS 125 07/30/2019   BILITOT 0.5 07/30/2019   GFRNONAA >60 08/01/2019   GFRAA >60 08/01/2019    Lab Results  Component Value Date   WBC 7.8 04/28/2020   NEUTROABS 4.9 04/28/2020   HGB 12.5 04/28/2020   HCT 39.1 04/28/2020   MCV 87.9 04/28/2020   PLT 183 04/28/2020   Lab Results  Component Value Date   IRON 52 04/28/2020   TIBC 274 04/28/2020   IRONPCTSAT 19 04/28/2020   Lab Results  Component Value Date   FERRITIN 85 04/28/2020     STUDIES: CT ANGIO HEAD W OR WO CONTRAST  Result Date: 07/22/2020 CLINICAL DATA:  Diplopia since a recent endoscopy. EXAM: CT ANGIOGRAPHY HEAD TECHNIQUE: Multidetector CT imaging of the head was performed using the standard protocol during bolus administration of intravenous contrast. Multiplanar CT image reconstructions and MIPs were obtained to evaluate the vascular anatomy. CONTRAST:  41mL OMNIPAQUE IOHEXOL 350 MG/ML SOLN COMPARISON:  Head CT 07/30/2019 and MRA 07/11/2012 FINDINGS: CT HEAD Brain: There is no evidence of an acute infarct, intracranial hemorrhage, mass, midline shift, or extra-axial fluid  collection. A small chronic infarct superiorly in the left cerebellar hemisphere is unchanged. There is also an unchanged subcentimeter calcification anteriorly in the right temporal lobe. The ventricles are normal in size. Mild asymmetric enlargement of the left sylvian fissure is unchanged. Vascular: Calcified atherosclerosis at the skull base. Skull: No fracture or suspicious osseous lesion. Sinuses: Paranasal sinuses and mastoid air cells are clear. Orbits: Left cataract extraction. CTA HEAD Anterior circulation: The internal carotid arteries are patent from skull base to carotid termini with mild atherosclerotic plaque being greater on the right where there is a mild stenosis at the level of the anterior genu. ACAs and MCAs are patent without evidence of a proximal branch occlusion. There is an early bifurcation of the left MCA with a moderate to severe stenosis at the bifurcation involving the superior greater than inferior M2 division origins. There is a mild-to-moderate stenosis of the distal right M1 segment. No aneurysm is identified. Posterior circulation: The visualized distal vertebral arteries are patent to the basilar and codominant. There is mild atherosclerotic irregularity of the right V4 segment without significant stenosis. Patent PICA, AICA, and SCA origins are identified bilaterally. There are large right and small left posterior communicating arteries. The right P1 segment is hypoplastic, and there may be a superimposed severe proximal P1 stenosis. On the left, there are moderate distal P1 and severe mid P2 stenoses. No aneurysm is identified. Venous sinuses: Patent. Anatomic variants: Predominantly fetal origin of the right PCA. IMPRESSION: 1. No evidence of acute intracranial abnormality. Small chronic cerebellar infarct. 2. Intracranial atherosclerosis including a  moderate to severe stenosis at the left MCA bifurcation and moderate left P1 and severe left P2 stenoses. Electronically Signed    By: Logan Bores M.D.   On: 07/22/2020 16:11    ASSESSMENT: Iron deficiency anemia.  PLAN:    1. Iron deficiency anemia:  Colonoscopy and EGD completed on September 20, 2016 were essentially normal other than some mild gastritis.  Patient's hemoglobin has significantly improved and is now within normal limits at 12.5.  Iron stores are pending at time of dictation.  Previously, the remainder of her laboratory work was either negative or within normal limits.  She does not require additional IV Feraheme today.  Patient last received treatment on March 10, 2020.  Return to clinic in 3 months with repeat laboratory work, further evaluation, and consideration of additional treatment if needed.   2.  Back pain: Chronic and unchanged.  Patient has permanent spinal stimulator in place. 3.  Diabetes: Patient now has insulin pump. 4.  Persistently cold/hair thinning: Patient's Synthroid is being adjusted by primary care.    Patient expressed understanding and was in agreement with this plan. She also understands that She can call clinic at any time with any questions, concerns, or complaints.    Lloyd Huger, MD   07/27/2020 10:10 AM

## 2020-08-01 ENCOUNTER — Inpatient Hospital Stay: Payer: Medicare Other

## 2020-08-01 ENCOUNTER — Inpatient Hospital Stay: Payer: Medicare Other | Admitting: Oncology

## 2020-08-17 ENCOUNTER — Other Ambulatory Visit: Payer: Self-pay

## 2020-08-17 ENCOUNTER — Inpatient Hospital Stay
Admission: EM | Admit: 2020-08-17 | Discharge: 2020-08-21 | DRG: 602 | Disposition: A | Discharge status: Home Health | Payer: Medicare Other | Attending: Student | Admitting: Student

## 2020-08-17 DIAGNOSIS — N182 Chronic kidney disease, stage 2 (mild): Secondary | ICD-10-CM | POA: Diagnosis present

## 2020-08-17 DIAGNOSIS — Z8249 Family history of ischemic heart disease and other diseases of the circulatory system: Secondary | ICD-10-CM

## 2020-08-17 DIAGNOSIS — I872 Venous insufficiency (chronic) (peripheral): Secondary | ICD-10-CM | POA: Diagnosis present

## 2020-08-17 DIAGNOSIS — K7460 Unspecified cirrhosis of liver: Secondary | ICD-10-CM | POA: Diagnosis present

## 2020-08-17 DIAGNOSIS — R2689 Other abnormalities of gait and mobility: Secondary | ICD-10-CM | POA: Diagnosis present

## 2020-08-17 DIAGNOSIS — Z993 Dependence on wheelchair: Secondary | ICD-10-CM

## 2020-08-17 DIAGNOSIS — Z87442 Personal history of urinary calculi: Secondary | ICD-10-CM

## 2020-08-17 DIAGNOSIS — E1122 Type 2 diabetes mellitus with diabetic chronic kidney disease: Secondary | ICD-10-CM | POA: Diagnosis present

## 2020-08-17 DIAGNOSIS — Z794 Long term (current) use of insulin: Secondary | ICD-10-CM

## 2020-08-17 DIAGNOSIS — E662 Morbid (severe) obesity with alveolar hypoventilation: Secondary | ICD-10-CM | POA: Diagnosis present

## 2020-08-17 DIAGNOSIS — T40605A Adverse effect of unspecified narcotics, initial encounter: Secondary | ICD-10-CM | POA: Diagnosis present

## 2020-08-17 DIAGNOSIS — M6282 Rhabdomyolysis: Secondary | ICD-10-CM

## 2020-08-17 DIAGNOSIS — E785 Hyperlipidemia, unspecified: Secondary | ICD-10-CM | POA: Diagnosis present

## 2020-08-17 DIAGNOSIS — Z90710 Acquired absence of both cervix and uterus: Secondary | ICD-10-CM

## 2020-08-17 DIAGNOSIS — Z9181 History of falling: Secondary | ICD-10-CM

## 2020-08-17 DIAGNOSIS — K5900 Constipation, unspecified: Secondary | ICD-10-CM | POA: Diagnosis present

## 2020-08-17 DIAGNOSIS — E039 Hypothyroidism, unspecified: Secondary | ICD-10-CM | POA: Diagnosis present

## 2020-08-17 DIAGNOSIS — F410 Panic disorder [episodic paroxysmal anxiety] without agoraphobia: Secondary | ICD-10-CM | POA: Diagnosis present

## 2020-08-17 DIAGNOSIS — R4182 Altered mental status, unspecified: Secondary | ICD-10-CM | POA: Diagnosis present

## 2020-08-17 DIAGNOSIS — L03115 Cellulitis of right lower limb: Secondary | ICD-10-CM | POA: Diagnosis not present

## 2020-08-17 DIAGNOSIS — Z9641 Presence of insulin pump (external) (internal): Secondary | ICD-10-CM | POA: Diagnosis present

## 2020-08-17 DIAGNOSIS — J449 Chronic obstructive pulmonary disease, unspecified: Secondary | ICD-10-CM | POA: Diagnosis present

## 2020-08-17 DIAGNOSIS — Y92009 Unspecified place in unspecified non-institutional (private) residence as the place of occurrence of the external cause: Secondary | ICD-10-CM

## 2020-08-17 DIAGNOSIS — I878 Other specified disorders of veins: Secondary | ICD-10-CM

## 2020-08-17 DIAGNOSIS — G8324 Monoplegia of upper limb affecting left nondominant side: Secondary | ICD-10-CM | POA: Diagnosis present

## 2020-08-17 DIAGNOSIS — Z20822 Contact with and (suspected) exposure to covid-19: Secondary | ICD-10-CM | POA: Diagnosis present

## 2020-08-17 DIAGNOSIS — L03116 Cellulitis of left lower limb: Principal | ICD-10-CM

## 2020-08-17 DIAGNOSIS — Z833 Family history of diabetes mellitus: Secondary | ICD-10-CM

## 2020-08-17 DIAGNOSIS — Z85820 Personal history of malignant melanoma of skin: Secondary | ICD-10-CM

## 2020-08-17 DIAGNOSIS — Z6841 Body Mass Index (BMI) 40.0 and over, adult: Secondary | ICD-10-CM

## 2020-08-17 DIAGNOSIS — W1830XA Fall on same level, unspecified, initial encounter: Secondary | ICD-10-CM | POA: Diagnosis present

## 2020-08-17 DIAGNOSIS — Z9682 Presence of neurostimulator: Secondary | ICD-10-CM

## 2020-08-17 DIAGNOSIS — M549 Dorsalgia, unspecified: Secondary | ICD-10-CM | POA: Diagnosis present

## 2020-08-17 DIAGNOSIS — Z888 Allergy status to other drugs, medicaments and biological substances status: Secondary | ICD-10-CM

## 2020-08-17 DIAGNOSIS — E113299 Type 2 diabetes mellitus with mild nonproliferative diabetic retinopathy without macular edema, unspecified eye: Secondary | ICD-10-CM | POA: Diagnosis present

## 2020-08-17 DIAGNOSIS — B309 Viral conjunctivitis, unspecified: Secondary | ICD-10-CM | POA: Diagnosis present

## 2020-08-17 DIAGNOSIS — Z885 Allergy status to narcotic agent status: Secondary | ICD-10-CM

## 2020-08-17 DIAGNOSIS — Z79899 Other long term (current) drug therapy: Secondary | ICD-10-CM

## 2020-08-17 DIAGNOSIS — E876 Hypokalemia: Secondary | ICD-10-CM | POA: Diagnosis not present

## 2020-08-17 DIAGNOSIS — W19XXXA Unspecified fall, initial encounter: Secondary | ICD-10-CM | POA: Diagnosis present

## 2020-08-17 DIAGNOSIS — I639 Cerebral infarction, unspecified: Secondary | ICD-10-CM | POA: Diagnosis present

## 2020-08-17 DIAGNOSIS — Z79890 Hormone replacement therapy (postmenopausal): Secondary | ICD-10-CM

## 2020-08-17 DIAGNOSIS — F329 Major depressive disorder, single episode, unspecified: Secondary | ICD-10-CM | POA: Diagnosis present

## 2020-08-17 DIAGNOSIS — Z96652 Presence of left artificial knee joint: Secondary | ICD-10-CM | POA: Diagnosis present

## 2020-08-17 DIAGNOSIS — E11649 Type 2 diabetes mellitus with hypoglycemia without coma: Secondary | ICD-10-CM

## 2020-08-17 DIAGNOSIS — E1165 Type 2 diabetes mellitus with hyperglycemia: Secondary | ICD-10-CM | POA: Diagnosis present

## 2020-08-17 DIAGNOSIS — Z9049 Acquired absence of other specified parts of digestive tract: Secondary | ICD-10-CM

## 2020-08-17 DIAGNOSIS — T796XXA Traumatic ischemia of muscle, initial encounter: Secondary | ICD-10-CM | POA: Diagnosis present

## 2020-08-17 DIAGNOSIS — Z91013 Allergy to seafood: Secondary | ICD-10-CM

## 2020-08-17 DIAGNOSIS — I2510 Atherosclerotic heart disease of native coronary artery without angina pectoris: Secondary | ICD-10-CM | POA: Diagnosis present

## 2020-08-17 DIAGNOSIS — I10 Essential (primary) hypertension: Secondary | ICD-10-CM | POA: Diagnosis present

## 2020-08-17 DIAGNOSIS — I890 Lymphedema, not elsewhere classified: Secondary | ICD-10-CM | POA: Diagnosis present

## 2020-08-17 DIAGNOSIS — J9611 Chronic respiratory failure with hypoxia: Secondary | ICD-10-CM | POA: Diagnosis present

## 2020-08-17 DIAGNOSIS — R55 Syncope and collapse: Secondary | ICD-10-CM | POA: Diagnosis present

## 2020-08-17 DIAGNOSIS — I6602 Occlusion and stenosis of left middle cerebral artery: Secondary | ICD-10-CM | POA: Diagnosis present

## 2020-08-17 DIAGNOSIS — H9192 Unspecified hearing loss, left ear: Secondary | ICD-10-CM | POA: Diagnosis present

## 2020-08-17 DIAGNOSIS — Z825 Family history of asthma and other chronic lower respiratory diseases: Secondary | ICD-10-CM

## 2020-08-17 DIAGNOSIS — N270 Small kidney, unilateral: Secondary | ICD-10-CM | POA: Diagnosis present

## 2020-08-17 DIAGNOSIS — H109 Unspecified conjunctivitis: Secondary | ICD-10-CM | POA: Diagnosis present

## 2020-08-17 DIAGNOSIS — Z841 Family history of disorders of kidney and ureter: Secondary | ICD-10-CM

## 2020-08-17 DIAGNOSIS — I672 Cerebral atherosclerosis: Secondary | ICD-10-CM | POA: Diagnosis present

## 2020-08-17 DIAGNOSIS — H4921 Sixth [abducent] nerve palsy, right eye: Secondary | ICD-10-CM | POA: Diagnosis present

## 2020-08-17 DIAGNOSIS — Z809 Family history of malignant neoplasm, unspecified: Secondary | ICD-10-CM

## 2020-08-17 DIAGNOSIS — F40240 Claustrophobia: Secondary | ICD-10-CM | POA: Diagnosis present

## 2020-08-17 DIAGNOSIS — Z91048 Other nonmedicinal substance allergy status: Secondary | ICD-10-CM

## 2020-08-17 DIAGNOSIS — G4733 Obstructive sleep apnea (adult) (pediatric): Secondary | ICD-10-CM | POA: Diagnosis present

## 2020-08-17 DIAGNOSIS — I160 Hypertensive urgency: Secondary | ICD-10-CM | POA: Diagnosis present

## 2020-08-17 DIAGNOSIS — Z8673 Personal history of transient ischemic attack (TIA), and cerebral infarction without residual deficits: Secondary | ICD-10-CM

## 2020-08-17 DIAGNOSIS — E1142 Type 2 diabetes mellitus with diabetic polyneuropathy: Secondary | ICD-10-CM | POA: Diagnosis present

## 2020-08-17 DIAGNOSIS — Z79891 Long term (current) use of opiate analgesic: Secondary | ICD-10-CM

## 2020-08-17 DIAGNOSIS — M1990 Unspecified osteoarthritis, unspecified site: Secondary | ICD-10-CM | POA: Diagnosis present

## 2020-08-17 DIAGNOSIS — I252 Old myocardial infarction: Secondary | ICD-10-CM

## 2020-08-17 DIAGNOSIS — I129 Hypertensive chronic kidney disease with stage 1 through stage 4 chronic kidney disease, or unspecified chronic kidney disease: Secondary | ICD-10-CM | POA: Diagnosis present

## 2020-08-17 DIAGNOSIS — E1151 Type 2 diabetes mellitus with diabetic peripheral angiopathy without gangrene: Secondary | ICD-10-CM | POA: Diagnosis present

## 2020-08-17 DIAGNOSIS — Z91012 Allergy to eggs: Secondary | ICD-10-CM

## 2020-08-17 DIAGNOSIS — G894 Chronic pain syndrome: Secondary | ICD-10-CM | POA: Diagnosis present

## 2020-08-17 MED ORDER — SODIUM CHLORIDE 0.9 % IV BOLUS
1000.0000 mL | Freq: Once | INTRAVENOUS | Status: AC
  Administered 2020-08-18: 1000 mL via INTRAVENOUS

## 2020-08-17 MED ORDER — SODIUM CHLORIDE 0.9 % IV SOLN
1.0000 g | Freq: Once | INTRAVENOUS | Status: AC
  Administered 2020-08-18: 1 g via INTRAVENOUS
  Filled 2020-08-17 (×2): qty 10

## 2020-08-17 NOTE — ED Provider Notes (Signed)
[facility] Emergency Department Provider Note  ____________________________________________   First MD Initiated Contact with Patient 08/17/20 2340     (approximate)  I have reviewed the triage vital signs and the nursing notes.   HISTORY  Chief Complaint Weakness    HPI Brittney [name] is a 69 y.o. female with below list of previous medical conditions presents to the emergency department via EMS presents to the emergency department after laying on the floor for the past 2 days".  Patient states that she did not fall but rather lay down on the floor and was unable to get up x2 days.  Patient does admit to generalized weakness generalized body aches chills.  Patient does admit to subjective fevers at home as well.  Patient does admit to lower extremity discomfort with weeping from the legs as well.  Denies any urinary symptoms.  No nausea or vomiting or diarrhea.  Patient denies any abdominal discomfort.        Past Medical History:  Diagnosis Date  . Abdominal pain, left upper quadrant 12/11/2012  . Arthritis   . Asthma   . Broken leg 2014   fell twice and broke same leg. had a bone stimulator for first break.  . Cancer ([place]) 2007   melanoma, left ear and back of left leg, removed from back  . Chronic kidney disease, stage 2 (mild)   . Chronic lower back pain   . Collagen vascular disease ([place])   . Coronary artery disease   . Diabetes mellitus without complication ([place])   . Diabetic nephropathy associated with secondary diabetes mellitus ([place])   . [place] 12/11/2012  . Gastroparesis   . Gross hematuria 12/11/2012  . Hepatic cirrhosis ([place])   . History of kidney stones   . Hyperlipidemia   . Hypertension   . Hypothyroidism   . Hypothyroidism   . IBS (irritable bowel syndrome)   . Kidney stone 12/11/2012  . Lower extremity edema   . Morbid obesity with BMI of 40.0-44.9, adult ([place])   . Myocardial infarction ([place]) 09/2013  . Nausea  without vomiting 12/11/2012  . Nephrolithiasis   . Nephrolithiasis 04/26/2014  . Nonproliferative retinopathy due to secondary diabetes ([place])   . Numbness and tingling of right leg   . Peripheral vascular disease ([place])   . Renal colic 64/40/3474  . Renal insufficiency   . Sciatica   . Sciatica 12/11/2012  . Skin cancer 08/2018  . Sleep apnea    sleep study coming in november  . Unilateral small kidney without contralateral hypertrophy     Patient Active Problem List   Diagnosis Date Noted  . Bilateral lower leg cellulitis 08/18/2020  . Rhabdomyolysis 08/18/2020  . Fall at home, initial encounter 08/18/2020  . Primary osteoarthritis of left knee 11/26/2019  . Sepsis ([place]) 07/30/2019  . Type 2 diabetes mellitus with diabetic polyneuropathy, with long-term current use of insulin ([place]) 07/03/2019  . OSA on CPAP 01/24/2019  . Lymphedema 09/03/2018  . Venous stasis of both lower extremities 09/03/2018  . Status post lumbar spine surgery for decompression of spinal cord 03/30/2018  . Myofascial pain 03/30/2018  . Spinal stenosis of thoracic region 03/30/2018  . Back pain 11/02/2017  . Iron deficiency anemia 05/10/2017  . Benign essential hypertension 03/10/2017  . Long term prescription benzodiazepine use 01/18/2017  . Vitamin D insufficiency 01/18/2017  . Neurogenic pain 01/17/2017  . L3-4 severe lumbar facet hypertrophy and spinal stenosis 01/10/2017  . Diabetes with retinopathy ([place])  12/07/2016  . Diabetic nephropathy ([place]) 12/07/2016  . Gastroparesis due to DM ([place]) 12/07/2016  . Long term current use of opiate analgesic 12/07/2016  . Long term prescription opiate use 12/07/2016  . Opiate use 12/07/2016  . Chronic pain syndrome 12/07/2016  . Chronic low back pain (Location of Primary Source of Pain) (Bilateral) (R>L) 12/07/2016  . 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 Chronic lower extremity pain (Location of Secondary source of pain) (Bilateral) (R>L)  12/07/2016  . Chronic knee pain (Location of Tertiary source of pain) (Right) 12/07/2016  . Osteoarthritis of knee (Right) 12/07/2016  . Grade 1 Anterolisthesis of L3 over L4 and L4 over L5 12/07/2016  . Osteoarthritis of sacroiliac joint (Right) 12/07/2016  . Generalized osteoarthritis of multiple sites 10/22/2016  . Degenerative spondylolisthesis 09/08/2016  . L3-4 severe lumbar spinal stenosis (12/31/2016 MRI) 09/08/2016  . Lung nodule, multiple 08/31/2016  . Hepatic cirrhosis ([place]) 06/20/2016  . Fibromyalgia 04/15/2016  . Seronegative arthritis 04/15/2016  . Diabetes mellitus with peripheral vascular disease ([place]) 03/16/2016  . Type 2 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, with long-term current use of insulin ([place]) 03/16/2016  . Morbid obesity with BMI of 50.0-59.9, adult ([place]) 03/16/2016  . Type 2 diabetes mellitus with diabetic nephropathy, with long-term current use of insulin ([place]) 03/16/2016  . Microalbuminuria 02/20/2016  . Essential hypertension 01/27/2016  . DM (diabetes mellitus) type II uncontrolled, periph vascular disorder ([place]) 04/26/2014  . Hyperlipemia 04/26/2014  . OSA (obstructive sleep apnea) 04/26/2014  . Abnormal tumor markers 12/11/2012  . Chronic kidney disease, stage II (mild) 12/11/2012  . Unilateral small kidney 12/11/2012    Past Surgical History:  Procedure Laterality Date  . ABDOMINAL HYSTERECTOMY    . BACK SURGERY  04/1999   L-4-5 LAMINECTOMY. metal in lower back and neck  . CESAREAN SECTION  1980  . CHOLECYSTECTOMY    . COLONOSCOPY  05/04/2001  . COLONOSCOPY WITH PROPOFOL N/A 09/20/2016   Procedure: COLONOSCOPY WITH PROPOFOL;  Surgeon: Lollie Sails, MD;  Location: [facility] ENDOSCOPY;  Service: Endoscopy;  Laterality: N/A;  . DILATION AND CURETTAGE OF UTERUS    . ESOPHAGOGASTRODUODENOSCOPY  02/08/2014  . ESOPHAGOGASTRODUODENOSCOPY (EGD) WITH PROPOFOL N/A 09/20/2016   Procedure:  ESOPHAGOGASTRODUODENOSCOPY (EGD) WITH PROPOFOL;  Surgeon: Lollie Sails, MD;  Location: [facility] ENDOSCOPY;  Service: Endoscopy;  Laterality: N/A;  . ESOPHAGOGASTRODUODENOSCOPY (EGD) WITH PROPOFOL N/A 05/23/2018   Procedure: ESOPHAGOGASTRODUODENOSCOPY (EGD) WITH PROPOFOL;  Surgeon: Lollie Sails, MD;  Location: [facility] ENDOSCOPY;  Service: Endoscopy;  Laterality: N/A;  . ESOPHAGOGASTRODUODENOSCOPY (EGD) WITH PROPOFOL N/A 07/18/2020   Procedure: ESOPHAGOGASTRODUODENOSCOPY (EGD) WITH PROPOFOL;  Surgeon: Lesly Rubenstein, MD;  Location: [facility] ENDOSCOPY;  Service: Gastroenterology;  Laterality: N/A;  . EYE SURGERY  1986   FOR MELANOMA  . HERNIA REPAIR    . JOINT REPLACEMENT Left 09/24/2013   TOTAL KNEE  . MOHS SURGERY  11/2016   left side of nose  . PULSE GENERATOR IMPLANT N/A 11/02/2017   Procedure: UNILATERAL PULSE GENERATOR IMPLANT;  Surgeon: Meade Maw, MD;  Location: [facility] ORS;  Service: Neurosurgery;  Laterality: N/A;  . TONSILLECTOMY     WITH ADENOIDECTOMY    Prior to Admission medications   Medication Sig Start Date End Date Taking? Authorizing Provider  albuterol (VENTOLIN HFA) 108 (90 Base) MCG/ACT inhaler Inhale 2 puffs into the lungs 3 (three) times daily as needed for wheezing or shortness of breath.     [provider]  ALPRAZolam (XANAX) 0.5 MG tablet Take 0.5 mg by mouth 4 (four) times daily.     [provider]  butalbital-acetaminophen-caffeine (FIORICET, ESGIC) 50-325-40 MG tablet Take 1 tablet by mouth 2 (two) times daily as needed for migraine.     [provider]  carvedilol (COREG) 25 MG tablet Take 50 mg by mouth 2 (two) times daily with a meal.     [provider]  cloNIDine (CATAPRES - DOSED IN MG/24 HR) 0.3 mg/24hr patch Place 0.3 mg onto the skin once a week.     [provider]  cyclobenzaprine (FLEXERIL) 10 MG tablet Take 20 mg by mouth at bedtime. 12/19/19   [provider]  doxazosin (CARDURA) 4 MG  tablet Take 4 mg by mouth every evening.     [provider]  escitalopram (LEXAPRO) 10 MG tablet Take 10 mg by mouth daily.  12/13/12   [provider]  fluticasone (FLONASE) 50 MCG/ACT nasal spray Place 2 sprays into both nostrils as needed.     [provider]  gabapentin (NEURONTIN) 600 MG tablet Take 3,000 mg by mouth at bedtime. Take 1 tablet (600mg ) by mouth twice daily and take 3 tablets (1800mg ) by mouth at bedtime 06/06/19   [provider]  hydrALAZINE (APRESOLINE) 50 MG tablet Take 50 mg by mouth 3 (three) times daily.     [provider]  ibuprofen (ADVIL,MOTRIN) 800 MG tablet Take 800 mg by mouth 3 (three) times daily as needed for mild pain or moderate pain.     [provider]  Insulin Detemir (LEVEMIR FLEXTOUCH) 100 UNIT/ML Pen Inject 140 Units into the skin 2 (two) times daily.     [provider]  Insulin Human (INSULIN PUMP) SOLN Inject into the skin.    [provider]  insulin regular human CONCENTRATED (HUMULIN R) 500 UNIT/ML kwikpen Inject 40-80 Units into the skin See admin instructions. Inject 80u under the skin at breakfast, inject 65-70u under the skin at lunch and inject 40-80u under the skin at dinner time based on blood glucose readings Patient not taking: Reported on 07/18/2020    [provider]  levothyroxine (SYNTHROID) 200 MCG tablet Take 200 mcg by mouth daily before breakfast.    [provider]  LINZESS 290 MCG CAPS capsule Take 290 mcg by mouth daily. 07/17/19   [provider]  metoCLOPramide (REGLAN) 10 MG tablet Take 10 mg by mouth 3 (three) times daily. 12/11/12   [provider]  metolazone (ZAROXOLYN) 5 MG tablet Take 5 mg by mouth 2 (two) times a week. 01/24/20 01/23/21  [provider]  ondansetron (ZOFRAN) 4 MG tablet Take 4 mg by mouth every 12 (twelve) hours as needed for nausea or vomiting.  05/14/19   [provider]   oxyCODONE-acetaminophen (PERCOCET) 10-325 MG tablet Take 1 tablet by mouth every 12 (twelve) hours as needed for pain. Must last 30 days. 07/28/20 08/27/20  [name], MD  oxyCODONE-acetaminophen (PERCOCET) 10-325 MG tablet Take 1 tablet by mouth every 12 (twelve) hours as needed for pain. Must last 30 days. 08/27/20 09/26/20  [name], MD  pantoprazole (PROTONIX) 40 MG tablet Take 40 mg by mouth 2 (two) times daily.     [provider]  potassium chloride (K-DUR) 10 MEQ tablet Take 10 mEq by mouth daily.    [provider]  pramipexole (MIRAPEX) 0.5 MG tablet Take 0.5 mg by mouth 2 (two) times daily.     [provider]  rosuvastatin (CRESTOR) 20 MG tablet Take 20 mg by mouth daily. Patient not taking: Reported on 06/24/2020 12/06/19 12/05/20  [provider]  traMADol (ULTRAM) 50 MG tablet Take 100 mg by mouth at bedtime as needed.     [provider]  traZODone (DESYREL) 150 MG tablet Take 150 mg by mouth at bedtime.  12/11/12   [provider]  zolpidem (AMBIEN) 10 MG tablet Take 10 mg by mouth at bedtime.  12/11/12   [provider]    Allergies Eggs or egg-derived products, Shellfish allergy, Amlodipine, Codeine, Imdur [isosorbide dinitrate], Lyrica [pregabalin], Monosodium glutamate, and Tape  Family History  Problem Relation Age of Onset  . Heart disease Mother   . Cancer Mother   . Asthma Mother   . Diabetes Father   . Kidney disease Father   . Hypertension Father   . Breast cancer Neg Hx     Social History Social History   Tobacco Use  . Smoking status: Never Smoker  . Smokeless tobacco: Never Used  Vaping Use  . Vaping Use: Never used  Substance Use Topics  . Alcohol use: No  . Drug use: Never    Review of Systems Constitutional: Positive for subjective fever/chills Eyes: No visual changes. ENT: No sore throat. Cardiovascular: Denies chest pain. Respiratory: Denies shortness of  breath. Gastrointestinal: No abdominal pain.  No nausea, no vomiting.  No diarrhea.  No constipation. Genitourinary: Negative for dysuria. Musculoskeletal: Negative for neck pain.  Negative for back pain. Integumentary: Positive for lower extremity redness and weeping Neurological: Negative for headaches, focal weakness or numbness.  ____________________________________________   PHYSICAL EXAM:  VITAL SIGNS: ED Triage Vitals [08/17/20 2344]  Enc Vitals Group     BP (!) 167/81     Pulse Rate 86     Resp (!) 22     Temp 99.1 F (37.3 C)     Temp Source Oral     SpO2 98 %     Weight      Height      Head Circumference      Peak Flow      Pain Score      Pain Loc      Pain Edu?      Excl. in [place]?     Constitutional: Alert and oriented.  Eyes: Conjunctivae are normal.  Head: Atraumatic. Mouth/Throat: Dry oral mucosa Neck: No stridor.  No meningeal signs.   Cardiovascular: Normal rate, regular rhythm. Good peripheral circulation. Grossly normal heart sounds. Respiratory: Normal respiratory effort.  No retractions. Gastrointestinal: Soft and nontender. No distention.  Musculoskeletal: No lower extremity tenderness nor edema. No gross deformities of extremities. Neurologic:  Normal speech and language. No gross focal neurologic deficits are appreciated.  Skin: Blanching erythema bilateral lower extremity right worse than left.  Both hot to touch. Psychiatric: Mood and affect are normal. Speech and behavior are normal.  ____________________________________________   LABS (all labs ordered are listed, but only abnormal results are displayed)  Labs Reviewed  COMPREHENSIVE METABOLIC PANEL - Abnormal; Notable for the following components:      Result Value   Chloride 96 (*)    CO2 33 (*)    Glucose, Bld 103 (*)    AST 55 (*)    All other components within normal limits  CBC WITH DIFFERENTIAL/PLATELET - Abnormal; Notable for the following components:   WBC 10.7 (*)     Neutro Abs 8.1 (*)    All other components within normal limits  URINALYSIS, COMPLETE (UACMP) WITH MICROSCOPIC - Abnormal; Notable for the following components:   Color, Urine YELLOW (*)    APPearance HAZY (*)    Hgb urine dipstick SMALL (*)    Protein, ur 100 (*)    Leukocytes,Ua TRACE (*)    All other components within normal limits  CK - Abnormal; Notable for the following components:   Total CK 597 (*)    All other components within normal limits  SARS CORONAVIRUS 2 BY RT PCR (HOSPITAL ORDER, [place] [facility])  CULTURE, BLOOD (SINGLE)  URINE CULTURE  LACTIC ACID, PLASMA  PROTIME-INR  APTT  HEMOGLOBIN A1C  HIV ANTIBODY (ROUTINE TESTING W REFLEX)  TROPONIN I (HIGH SENSITIVITY)   ____________________________________________  EKG   ED ECG REPORT I, Noble N Pina Sirianni, the attending physician, personally viewed and interpreted this ECG.   Date: 08/18/2020  EKG Time: 5:03 AM  Rate: 85  Rhythm: Normal sinus rhythm  Axis: Normal  Intervals: Normal  ST&T Change: None  ____________________________________________  RADIOLOGY I,  N Tyjay Galindo, personally viewed and evaluated these images (plain radiographs) as part of my medical decision making, as well as reviewing the written report by the radiologist.  ED MD interpretation: No acute intracranial abnormality noted on head CT.  Official radiology report(s): CT Head Wo Contrast  Result Date: 08/18/2020 CLINICAL DATA:  Syncope EXAM: CT HEAD WITHOUT CONTRAST TECHNIQUE: Contiguous axial images were obtained from the base of the skull through the vertex without intravenous contrast. COMPARISON:  None. FINDINGS: Brain: There is no mass, hemorrhage or extra-axial collection. The size and configuration of the ventricles and extra-axial CSF spaces are normal. The brain parenchyma is normal, without acute infarction. 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 Vascular: No abnormal hyperdensity of the major intracranial arteries or dural venous sinuses. No intracranial atherosclerosis. Skull: The visualized skull base, calvarium and extracranial soft tissues are normal. Sinuses/Orbits: No fluid levels or advanced mucosal thickening of the visualized paranasal sinuses. No mastoid or middle ear effusion. The orbits are normal. IMPRESSION: 1. No acute intracranial abnormality. 2. 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 3. Unchanged left cerebellar infarct. Electronically Signed   By: Ulyses Jarred M.D.   On: 08/18/2020 01:18   DG Chest Port 1 View  Result Date: 08/18/2020 CLINICAL DATA:  Sepsis EXAM: PORTABLE CHEST 1 VIEW COMPARISON:  07/30/2019 FINDINGS: The heart size and mediastinal contours are within normal limits. Both lungs are clear. The visualized skeletal structures are unremarkable. IMPRESSION: No active disease. Electronically Signed   By: Ulyses Jarred M.D.   On: 08/18/2020 00:44    ____________________________________________   PROCEDURES   Procedure(s) performed (including Critical Care):  Procedures   ____________________________________________   INITIAL IMPRESSION / MDM / [place] / ED COURSE  As part of my medical decision making, I reviewed the following data within the electronic MEDICAL RECORD [id number] year old female presented with above-stated history and physical exam a differential diagnosis including but not limited bilateral lower extremity cellulitis, dehydration, rhabdomyolysis, intracranial pathology.  Laboratory data notable for an elevated CK of 597.  Patient given 1 L IV normal saline.  CT head revealed no acute intracranial abnormality.  Patient given IV ceftriaxone secondary to bilateral lower extremity cellulitis which is worse on the right leg.  Patient discussed with Dr. Damita Dunnings for hospital admission further evaluation and  management.  ____________________________________________  FINAL CLINICAL IMPRESSION(S) / ED DIAGNOSES  Final diagnoses:  Bilateral cellulitis of lower leg     MEDICATIONS GIVEN DURING THIS VISIT:  Medications  cefTRIAXone (ROCEPHIN) 1 g in sodium chloride 0.9 % 100 mL IVPB (has no administration in time range)  insulin aspart (novoLOG) injection 0-20 Units (has no administration in time range)  insulin aspart (novoLOG) injection 0-5 Units (has no administration in time range)  enoxaparin (LOVENOX) injection 40 mg (has no administration in time range)  0.9 %  sodium chloride infusion ( Intravenous New Bag/Given 08/18/20 0326)  acetaminophen (TYLENOL) tablet 650 mg (has no administration in time range)    Or  acetaminophen (TYLENOL) suppository 650 mg (has no administration in time range)  ondansetron (ZOFRAN) tablet 4 mg (has no administration in time range)    Or  ondansetron (ZOFRAN) injection 4 mg (has no administration in time range)  morphine 2 MG/ML injection 2 mg (has no administration in time range)  bisacodyl (DULCOLAX) EC tablet 5 mg (has no administration in time range)  oxyCODONE-acetaminophen (PERCOCET/ROXICET) 5-325 MG per tablet 1 tablet (has no administration in time range)    And  oxyCODONE (Oxy IR/ROXICODONE) immediate release tablet 5 mg (has no administration in time range)  sodium chloride 0.9 % bolus 1,000 mL (0 mLs Intravenous Stopped 08/18/20 0204)  cefTRIAXone (ROCEPHIN) 1 g in sodium chloride 0.9 % 100 mL IVPB (0 g Intravenous Stopped 08/18/20 0202)  sodium chloride 0.9 % bolus 1,000 mL (0 mLs Intravenous Stopped 08/18/20 0326)     ED Discharge Orders    None      *Please note:  Brittney [name] was evaluated in Emergency Department on 08/18/2020 for the symptoms described in the history of present illness. She was evaluated in the context of the global COVID-19 pandemic, which necessitated consideration that the patient might be at risk for infection  with the SARS-CoV-2 virus that causes COVID-19. Institutional protocols and algorithms that pertain to the evaluation of patients at risk for COVID-19 are in a state of rapid change based on information released by regulatory bodies including the CDC and federal and state organizations. These policies and algorithms were followed during the patient's care in the ED.  Some ED evaluations and interventions may be delayed as a result of limited staffing during and after the pandemic.*  Note:  This document was prepared using Dragon voice recognition software and may include unintentional dictation errors.   Gregor Hams, MD 08/18/20 [phone]

## 2020-08-17 NOTE — ED Triage Notes (Signed)
Pt states was on floor for two days. Pt denies falling, states she does not know how she ended up on floor. Pt states she has not had an po intake in two days. Pt complains of weakness, generalized body aches, chills. Pt with wound noted to right foot.

## 2020-08-18 ENCOUNTER — Emergency Department: Payer: Medicare Other

## 2020-08-18 ENCOUNTER — Other Ambulatory Visit: Payer: Self-pay

## 2020-08-18 ENCOUNTER — Inpatient Hospital Stay: Payer: Medicare Other

## 2020-08-18 ENCOUNTER — Inpatient Hospital Stay (HOSPITAL_COMMUNITY)
Admit: 2020-08-18 | Discharge: 2020-08-18 | Disposition: A | Discharge status: Home / Self Care | Payer: Medicare Other | Attending: Internal Medicine | Admitting: Internal Medicine

## 2020-08-18 ENCOUNTER — Encounter: Payer: Self-pay | Admitting: Internal Medicine

## 2020-08-18 ENCOUNTER — Inpatient Hospital Stay: Admit: 2020-08-18 | Payer: Medicare Other

## 2020-08-18 DIAGNOSIS — E1122 Type 2 diabetes mellitus with diabetic chronic kidney disease: Secondary | ICD-10-CM | POA: Diagnosis present

## 2020-08-18 DIAGNOSIS — Z9682 Presence of neurostimulator: Secondary | ICD-10-CM | POA: Diagnosis not present

## 2020-08-18 DIAGNOSIS — W19XXXA Unspecified fall, initial encounter: Secondary | ICD-10-CM

## 2020-08-18 DIAGNOSIS — R55 Syncope and collapse: Secondary | ICD-10-CM | POA: Diagnosis not present

## 2020-08-18 DIAGNOSIS — Y92009 Unspecified place in unspecified non-institutional (private) residence as the place of occurrence of the external cause: Secondary | ICD-10-CM | POA: Diagnosis not present

## 2020-08-18 DIAGNOSIS — T796XXA Traumatic ischemia of muscle, initial encounter: Secondary | ICD-10-CM | POA: Diagnosis present

## 2020-08-18 DIAGNOSIS — I6602 Occlusion and stenosis of left middle cerebral artery: Secondary | ICD-10-CM | POA: Diagnosis present

## 2020-08-18 DIAGNOSIS — F40240 Claustrophobia: Secondary | ICD-10-CM | POA: Diagnosis present

## 2020-08-18 DIAGNOSIS — L03115 Cellulitis of right lower limb: Secondary | ICD-10-CM | POA: Diagnosis present

## 2020-08-18 DIAGNOSIS — Z20822 Contact with and (suspected) exposure to covid-19: Secondary | ICD-10-CM | POA: Diagnosis present

## 2020-08-18 DIAGNOSIS — I10 Essential (primary) hypertension: Secondary | ICD-10-CM | POA: Diagnosis not present

## 2020-08-18 DIAGNOSIS — Z6841 Body Mass Index (BMI) 40.0 and over, adult: Secondary | ICD-10-CM | POA: Diagnosis not present

## 2020-08-18 DIAGNOSIS — R2689 Other abnormalities of gait and mobility: Secondary | ICD-10-CM | POA: Diagnosis present

## 2020-08-18 DIAGNOSIS — E662 Morbid (severe) obesity with alveolar hypoventilation: Secondary | ICD-10-CM | POA: Diagnosis present

## 2020-08-18 DIAGNOSIS — E1165 Type 2 diabetes mellitus with hyperglycemia: Secondary | ICD-10-CM | POA: Diagnosis present

## 2020-08-18 DIAGNOSIS — N182 Chronic kidney disease, stage 2 (mild): Secondary | ICD-10-CM | POA: Diagnosis present

## 2020-08-18 DIAGNOSIS — E1142 Type 2 diabetes mellitus with diabetic polyneuropathy: Secondary | ICD-10-CM | POA: Diagnosis not present

## 2020-08-18 DIAGNOSIS — M6282 Rhabdomyolysis: Secondary | ICD-10-CM

## 2020-08-18 DIAGNOSIS — I872 Venous insufficiency (chronic) (peripheral): Secondary | ICD-10-CM | POA: Diagnosis present

## 2020-08-18 DIAGNOSIS — I890 Lymphedema, not elsewhere classified: Secondary | ICD-10-CM | POA: Diagnosis present

## 2020-08-18 DIAGNOSIS — L03116 Cellulitis of left lower limb: Secondary | ICD-10-CM | POA: Diagnosis present

## 2020-08-18 DIAGNOSIS — G894 Chronic pain syndrome: Secondary | ICD-10-CM | POA: Diagnosis present

## 2020-08-18 DIAGNOSIS — F410 Panic disorder [episodic paroxysmal anxiety] without agoraphobia: Secondary | ICD-10-CM | POA: Diagnosis present

## 2020-08-18 DIAGNOSIS — W1830XA Fall on same level, unspecified, initial encounter: Secondary | ICD-10-CM | POA: Diagnosis present

## 2020-08-18 DIAGNOSIS — H109 Unspecified conjunctivitis: Secondary | ICD-10-CM | POA: Diagnosis present

## 2020-08-18 DIAGNOSIS — F329 Major depressive disorder, single episode, unspecified: Secondary | ICD-10-CM | POA: Diagnosis present

## 2020-08-18 DIAGNOSIS — I639 Cerebral infarction, unspecified: Secondary | ICD-10-CM | POA: Diagnosis present

## 2020-08-18 DIAGNOSIS — Z79891 Long term (current) use of opiate analgesic: Secondary | ICD-10-CM | POA: Diagnosis not present

## 2020-08-18 DIAGNOSIS — I129 Hypertensive chronic kidney disease with stage 1 through stage 4 chronic kidney disease, or unspecified chronic kidney disease: Secondary | ICD-10-CM | POA: Diagnosis present

## 2020-08-18 DIAGNOSIS — J9611 Chronic respiratory failure with hypoxia: Secondary | ICD-10-CM | POA: Diagnosis present

## 2020-08-18 DIAGNOSIS — R296 Repeated falls: Secondary | ICD-10-CM | POA: Diagnosis not present

## 2020-08-18 DIAGNOSIS — Z993 Dependence on wheelchair: Secondary | ICD-10-CM | POA: Diagnosis not present

## 2020-08-18 LAB — COMPREHENSIVE METABOLIC PANEL
ALT: 23 U/L (ref 0–44)
AST: 55 U/L — ABNORMAL HIGH (ref 15–41)
Albumin: 3.5 g/dL (ref 3.5–5.0)
Alkaline Phosphatase: 96 U/L (ref 38–126)
Anion gap: 10 (ref 5–15)
BUN: 16 mg/dL (ref 8–23)
CO2: 33 mmol/L — ABNORMAL HIGH (ref 22–32)
Calcium: 10.1 mg/dL (ref 8.9–10.3)
Chloride: 96 mmol/L — ABNORMAL LOW (ref 98–111)
Creatinine, Ser: 0.84 mg/dL (ref 0.44–1.00)
GFR calc Af Amer: >60 mL/min (ref >60)
GFR calc non Af Amer: >60 mL/min (ref >60)
Glucose, Bld: 103 mg/dL — ABNORMAL HIGH (ref 70–99)
Potassium: 3.6 mmol/L (ref 3.5–5.1)
Sodium: 139 mmol/L (ref 135–145)
Total Bilirubin: 0.7 mg/dL (ref 0.3–1.2)
Total Protein: 7.4 g/dL (ref 6.5–8.1)

## 2020-08-18 LAB — URINE CULTURE

## 2020-08-18 LAB — BASIC METABOLIC PANEL
Anion gap: 11 (ref 5–15)
BUN: 14 mg/dL (ref 8–23)
CO2: 31 mmol/L (ref 22–32)
Calcium: 9.1 mg/dL (ref 8.9–10.3)
Chloride: 100 mmol/L (ref 98–111)
Creatinine, Ser: 0.66 mg/dL (ref 0.44–1.00)
GFR calc Af Amer: >60 mL/min (ref >60)
GFR calc non Af Amer: >60 mL/min (ref >60)
Glucose, Bld: 160 mg/dL — ABNORMAL HIGH (ref 70–99)
Potassium: 3.5 mmol/L (ref 3.5–5.1)
Sodium: 142 mmol/L (ref 135–145)

## 2020-08-18 LAB — CBC
HCT: 35.7 % — ABNORMAL LOW (ref 36.0–46.0)
Hemoglobin: 11.3 g/dL — ABNORMAL LOW (ref 12.0–15.0)
MCH: 28.5 pg (ref 26.0–34.0)
MCHC: 31.7 g/dL (ref 30.0–36.0)
MCV: 89.9 fL (ref 80.0–100.0)
Platelets: 177 10*3/uL (ref 150–400)
RBC: 3.97 MIL/uL (ref 3.87–5.11)
RDW: 13.9 % (ref 11.5–15.5)
WBC: 8.2 10*3/uL (ref 4.0–10.5)
nRBC: 0 % (ref 0.0–0.2)

## 2020-08-18 LAB — CBC WITH DIFFERENTIAL/PLATELET
Abs Immature Granulocytes: 0.03 10*3/uL (ref 0.00–0.07)
Basophils Absolute: 0.1 10*3/uL (ref 0.0–0.1)
Basophils Relative: 1 %
Eosinophils Absolute: 0.1 10*3/uL (ref 0.0–0.5)
Eosinophils Relative: 1 %
HCT: 40.8 % (ref 36.0–46.0)
Hemoglobin: 13.2 g/dL (ref 12.0–15.0)
Immature Granulocytes: 0 %
Lymphocytes Relative: 16 %
Lymphs Abs: 1.7 10*3/uL (ref 0.7–4.0)
MCH: 28.5 pg (ref 26.0–34.0)
MCHC: 32.4 g/dL (ref 30.0–36.0)
MCV: 88.1 fL (ref 80.0–100.0)
Monocytes Absolute: 0.6 10*3/uL (ref 0.1–1.0)
Monocytes Relative: 5 %
Neutro Abs: 8.1 10*3/uL — ABNORMAL HIGH (ref 1.7–7.7)
Neutrophils Relative %: 77 %
Platelets: 192 10*3/uL (ref 150–400)
RBC: 4.63 MIL/uL (ref 3.87–5.11)
RDW: 13.9 % (ref 11.5–15.5)
WBC: 10.7 10*3/uL — ABNORMAL HIGH (ref 4.0–10.5)
nRBC: 0 % (ref 0.0–0.2)

## 2020-08-18 LAB — URINALYSIS, COMPLETE (UACMP) WITH MICROSCOPIC
Bacteria, UA: NONE SEEN
Bilirubin Urine: NEGATIVE
Glucose, UA: NEGATIVE mg/dL
Ketones, ur: NEGATIVE mg/dL
Nitrite: NEGATIVE
Protein, ur: 100 mg/dL — AB
Specific Gravity, Urine: 1.015 (ref 1.005–1.030)
pH: 6 (ref 5.0–8.0)

## 2020-08-18 LAB — SEDIMENTATION RATE: Sed Rate: 45 mm/hr — ABNORMAL HIGH (ref 0–30)

## 2020-08-18 LAB — GLUCOSE, CAPILLARY
Glucose-Capillary: 93 mg/dL (ref 70–99)
Glucose-Capillary: 158 mg/dL — ABNORMAL HIGH (ref 70–99)
Glucose-Capillary: 175 mg/dL — ABNORMAL HIGH (ref 70–99)
Glucose-Capillary: 210 mg/dL — ABNORMAL HIGH (ref 70–99)
Glucose-Capillary: 191 mg/dL — ABNORMAL HIGH (ref 70–99)

## 2020-08-18 LAB — LIPID PANEL
Cholesterol: 196 mg/dL (ref 0–200)
HDL: 59 mg/dL (ref >40)
LDL Cholesterol: 114 mg/dL — ABNORMAL HIGH (ref 0–99)
Total CHOL/HDL Ratio: 3.3 RATIO
Triglycerides: 113 mg/dL (ref <150)
VLDL: 23 mg/dL (ref 0–40)

## 2020-08-18 LAB — HIV ANTIBODY (ROUTINE TESTING W REFLEX): HIV Screen 4th Generation wRfx: NONREACTIVE

## 2020-08-18 LAB — CK: Total CK: 597 U/L — ABNORMAL HIGH (ref 38–234)

## 2020-08-18 LAB — TSH: TSH: 0.752 u[IU]/mL (ref 0.350–4.500)

## 2020-08-18 LAB — PROTIME-INR
INR: 0.9 (ref 0.8–1.2)
Prothrombin Time: 12.2 seconds (ref 11.4–15.2)

## 2020-08-18 LAB — TROPONIN I (HIGH SENSITIVITY): Troponin I (High Sensitivity): 8 ng/L (ref <18)

## 2020-08-18 LAB — SARS CORONAVIRUS 2 BY RT PCR (HOSPITAL ORDER, PERFORMED IN ~~LOC~~ HOSPITAL LAB): SARS Coronavirus 2: NEGATIVE

## 2020-08-18 LAB — APTT: aPTT: 33 seconds (ref 24–36)

## 2020-08-18 LAB — LACTIC ACID, PLASMA: Lactic Acid, Venous: 1.5 mmol/L (ref 0.5–1.9)

## 2020-08-18 MED ORDER — IRBESARTAN 150 MG PO TABS
150.0000 mg | ORAL_TABLET | Freq: Every day | ORAL | Status: DC
  Administered 2020-08-18 – 2020-08-21 (×4): 150 mg via ORAL
  Filled 2020-08-18 (×5): qty 1

## 2020-08-18 MED ORDER — SODIUM CHLORIDE 0.9 % IV SOLN: INTRAVENOUS | Status: DC

## 2020-08-18 MED ORDER — IOHEXOL 350 MG/ML SOLN
100.0000 mL | Freq: Once | INTRAVENOUS | Status: AC | PRN
  Administered 2020-08-18: 100 mL via INTRAVENOUS
  Filled 2020-08-18: qty 100

## 2020-08-18 MED ORDER — METOLAZONE 5 MG PO TABS
5.0000 mg | ORAL_TABLET | ORAL | Status: DC
  Administered 2020-08-18 – 2020-08-21 (×2): 5 mg via ORAL
  Filled 2020-08-18 (×2): qty 1

## 2020-08-18 MED ORDER — ALPRAZOLAM 0.25 MG PO TABS: 0.5000 mg | ORAL_TABLET | Freq: Four times a day (QID) | ORAL | Status: DC

## 2020-08-18 MED ORDER — POTASSIUM CHLORIDE CRYS ER 20 MEQ PO TBCR
10.0000 meq | EXTENDED_RELEASE_TABLET | Freq: Every day | ORAL | Status: DC
  Administered 2020-08-18 – 2020-08-20 (×3): 10 meq via ORAL
  Filled 2020-08-18 (×5): qty 1

## 2020-08-18 MED ORDER — OXYCODONE-ACETAMINOPHEN 5-325 MG PO TABS
1.0000 | ORAL_TABLET | Freq: Four times a day (QID) | ORAL | Status: DC | PRN
  Administered 2020-08-18: 1 via ORAL
  Filled 2020-08-18: qty 1

## 2020-08-18 MED ORDER — LEVOTHYROXINE SODIUM 100 MCG PO TABS
200.0000 ug | ORAL_TABLET | Freq: Every day | ORAL | Status: DC
  Administered 2020-08-19 – 2020-08-21 (×3): 200 ug via ORAL
  Filled 2020-08-18 (×4): qty 2

## 2020-08-18 MED ORDER — INSULIN ASPART 100 UNIT/ML ~~LOC~~ SOLN: 0.0000 [IU] | Freq: Three times a day (TID) | SUBCUTANEOUS | Status: DC

## 2020-08-18 MED ORDER — BUDESONIDE 0.5 MG/2ML IN SUSP
2.0000 mL | Freq: Every day | RESPIRATORY_TRACT | Status: DC
  Administered 2020-08-18 – 2020-08-19 (×2): 0.5 mg via RESPIRATORY_TRACT
  Filled 2020-08-18 (×5): qty 2

## 2020-08-18 MED ORDER — LINACLOTIDE 290 MCG PO CAPS
290.0000 ug | ORAL_CAPSULE | Freq: Every day | ORAL | Status: DC
  Filled 2020-08-18 (×3): qty 1

## 2020-08-18 MED ORDER — ALPRAZOLAM 0.5 MG PO TABS
1.0000 mg | ORAL_TABLET | Freq: Every evening | ORAL | Status: DC | PRN
  Administered 2020-08-18 – 2020-08-20 (×3): 1 mg via ORAL
  Filled 2020-08-18: qty 2
  Filled 2020-08-18: qty 4
  Filled 2020-08-18: qty 2

## 2020-08-18 MED ORDER — ACETAMINOPHEN 650 MG RE SUPP: 650.0000 mg | Freq: Four times a day (QID) | RECTAL | Status: DC | PRN

## 2020-08-18 MED ORDER — CARVEDILOL 25 MG PO TABS
50.0000 mg | ORAL_TABLET | Freq: Two times a day (BID) | ORAL | Status: DC
  Administered 2020-08-18 – 2020-08-21 (×6): 50 mg via ORAL
  Filled 2020-08-18 (×6): qty 2

## 2020-08-18 MED ORDER — ONDANSETRON HCL 4 MG/2ML IJ SOLN
4.0000 mg | Freq: Four times a day (QID) | INTRAMUSCULAR | Status: DC | PRN
  Administered 2020-08-19 – 2020-08-21 (×6): 4 mg via INTRAVENOUS
  Filled 2020-08-18 (×6): qty 2

## 2020-08-18 MED ORDER — NAPHAZOLINE-GLYCERIN 0.012-0.2 % OP SOLN
1.0000 [drp] | Freq: Four times a day (QID) | OPHTHALMIC | Status: DC | PRN
  Filled 2020-08-18: qty 15

## 2020-08-18 MED ORDER — BISACODYL 5 MG PO TBEC: 5.0000 mg | DELAYED_RELEASE_TABLET | Freq: Every day | ORAL | Status: DC | PRN

## 2020-08-18 MED ORDER — CEFAZOLIN SODIUM-DEXTROSE 2-4 GM/100ML-% IV SOLN
2.0000 g | Freq: Three times a day (TID) | INTRAVENOUS | Status: DC
  Administered 2020-08-18 – 2020-08-20 (×5): 2 g via INTRAVENOUS
  Filled 2020-08-18 (×12): qty 100

## 2020-08-18 MED ORDER — ACETAMINOPHEN 325 MG PO TABS: 650.0000 mg | ORAL_TABLET | Freq: Four times a day (QID) | ORAL | Status: DC | PRN

## 2020-08-18 MED ORDER — MORPHINE SULFATE (PF) 2 MG/ML IV SOLN: 2.0000 mg | INTRAVENOUS | Status: DC | PRN

## 2020-08-18 MED ORDER — FUROSEMIDE 40 MG PO TABS
40.0000 mg | ORAL_TABLET | Freq: Every day | ORAL | Status: DC
  Administered 2020-08-18 – 2020-08-20 (×3): 40 mg via ORAL
  Filled 2020-08-18 (×3): qty 1

## 2020-08-18 MED ORDER — ENOXAPARIN SODIUM 40 MG/0.4ML ~~LOC~~ SOLN
40.0000 mg | Freq: Two times a day (BID) | SUBCUTANEOUS | Status: DC
  Administered 2020-08-18 – 2020-08-21 (×6): 40 mg via SUBCUTANEOUS
  Filled 2020-08-18 (×6): qty 0.4

## 2020-08-18 MED ORDER — ALPRAZOLAM 0.5 MG PO TABS
0.5000 mg | ORAL_TABLET | Freq: Every day | ORAL | Status: DC | PRN
  Administered 2020-08-19 – 2020-08-21 (×3): 0.5 mg via ORAL
  Filled 2020-08-18 (×4): qty 1

## 2020-08-18 MED ORDER — OXYCODONE HCL 5 MG PO TABS
5.0000 mg | ORAL_TABLET | Freq: Four times a day (QID) | ORAL | Status: DC | PRN
  Administered 2020-08-18 – 2020-08-19 (×2): 5 mg via ORAL
  Filled 2020-08-18 (×2): qty 1

## 2020-08-18 MED ORDER — OXYCODONE-ACETAMINOPHEN 10-325 MG PO TABS: 1.0000 | ORAL_TABLET | Freq: Four times a day (QID) | ORAL | Status: DC | PRN

## 2020-08-18 MED ORDER — INSULIN ASPART 100 UNIT/ML ~~LOC~~ SOLN: 0.0000 [IU] | Freq: Every day | SUBCUTANEOUS | Status: DC

## 2020-08-18 MED ORDER — IPRATROPIUM-ALBUTEROL 0.5-2.5 (3) MG/3ML IN SOLN
3.0000 mL | Freq: Four times a day (QID) | RESPIRATORY_TRACT | Status: DC
  Administered 2020-08-18: 3 mL via RESPIRATORY_TRACT
  Filled 2020-08-18 (×4): qty 3

## 2020-08-18 MED ORDER — SODIUM CHLORIDE 0.9 % IV SOLN: 1.0000 g | INTRAVENOUS | Status: DC

## 2020-08-18 MED ORDER — ROSUVASTATIN CALCIUM 20 MG PO TABS: 20.0000 mg | ORAL_TABLET | Freq: Every day | ORAL | Status: DC

## 2020-08-18 MED ORDER — ESCITALOPRAM OXALATE 10 MG PO TABS
10.0000 mg | ORAL_TABLET | Freq: Every day | ORAL | Status: DC
  Administered 2020-08-19 – 2020-08-21 (×3): 10 mg via ORAL
  Filled 2020-08-18 (×3): qty 1

## 2020-08-18 MED ORDER — LORAZEPAM 2 MG/ML IJ SOLN
1.0000 mg | Freq: Once | INTRAMUSCULAR | Status: DC | PRN
  Filled 2020-08-18: qty 1

## 2020-08-18 MED ORDER — HYDRALAZINE HCL 50 MG PO TABS
50.0000 mg | ORAL_TABLET | Freq: Three times a day (TID) | ORAL | Status: DC
  Administered 2020-08-18 – 2020-08-20 (×6): 50 mg via ORAL
  Filled 2020-08-18 (×6): qty 1

## 2020-08-18 MED ORDER — ONDANSETRON HCL 4 MG PO TABS: 4.0000 mg | ORAL_TABLET | Freq: Four times a day (QID) | ORAL | Status: DC | PRN

## 2020-08-18 MED ORDER — SODIUM CHLORIDE 0.9 % IV BOLUS
1000.0000 mL | Freq: Once | INTRAVENOUS | Status: AC
  Administered 2020-08-18: 1000 mL via INTRAVENOUS

## 2020-08-18 NOTE — ED Notes (Signed)
Pt very anxious at this time, will admin xanax

## 2020-08-18 NOTE — ED Notes (Signed)
Pt self dosing with insulin pump at this time, BG 191, pt dosing 2.1U at this time as would at home

## 2020-08-18 NOTE — ED Notes (Signed)
Dr. Neysa Bonito notified via text that pt is using her own insulin pump.

## 2020-08-18 NOTE — Evaluation (Addendum)
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 Has a spinal cord stimulator for chronic pain. Patient presenting following a fall without injury in which she stayed on the floor for 2 days. Mechanism for fall unknown but workup ongoing. Patient with BLE cellulitis, rhabdomyolysis, physical deconditioning. CT of head with no acute intracranial abnormality.     Clinical Impression  PT evaluation completed. Patient alert and able to follow single step commands consistently. Patient states she lives at home alone and has a Marine scientist aide 3 days per week to assist as needed. Patient ambulates very limited distance using rolling walker at baseline and uses a rolling office chair around her home for mobility. Limited participation with functional mobility efforts. Patient currently reports 8/10 right leg pain that worsens with any active movement. Active movement noted in RLE more than LLE, but self limits due to pain. Patient declined sitting up on edge of bed. Min A required for rolling to left side using bed rails. Patient appears fatigued after minimal activity. Patient also reports double vision during exam. Patient adamant at this time about going home at discharge and wants to increase home health services. SNF is recommended for short term rehab prior to discharging home alone. Recommend PT to address functional mobility limitations listed below to maximize independence and return to PLOF.     Follow Up Recommendations SNF    Equipment  Recommendations   (ongoing assessment )    Recommendations for Other Services       Precautions / Restrictions Precautions Precautions: Fall Restrictions Weight Bearing Restrictions: No      Mobility  Bed Mobility Overal bed mobility: Needs Assistance Bed Mobility: Rolling Rolling: Min assist         General bed mobility comments: Min A for rolling to left side in bed. Patient using bed rails for support. Patient declined sitting up on edge of bed, stating she wants to rest. Encouragement provided for participation   Transfers                 General transfer comment: not assessed as patient unwilling to sit up on edge of bed at this time   Ambulation/Gait                Stairs            Wheelchair Mobility    Modified Rankin (Stroke Patients Only)       Balance Overall balance assessment: History of Falls (unable to formally test sitting or standing balance )                                           Pertinent Vitals/Pain Pain Assessment: 0-10 Pain Score: 8  Pain Location: right leg  Pain Descriptors / Indicators: Constant Pain Intervention(s): Limited activity within patient's tolerance    Home Living Family/patient expects to be discharged to:: Private residence Living Arrangements: Alone Available Help at Discharge: Home health (patient has aide 3 days per week ) Type of Home: Mobile home  Home Access: Stairs to enter Entrance Stairs-Rails: Right;Left Entrance Stairs-Number of Steps: one step  Home Layout: One level Home Equipment: Walker - 2 wheels;Shower seat      Prior Function Level of Independence: Independent with assistive device(s)         Comments: Patient uses a rolling office chair for mobility around her home. When she has to ambulate, patient uses a rolling walker for ambulation, short distance only. Patient needs physical assistance to get up the step at her home (aide physically lifts RLE on  to step per patient report). Patient needs at least set-up assistance for ADLs at home and aide assist. Patient gets meals on wheels also.      Hand Dominance   Dominant Hand: Left    Extremity/Trunk Assessment   Upper Extremity Assessment Upper Extremity Assessment: Generalized weakness    Lower Extremity Assessment Lower Extremity Assessment: RLE deficits/detail;LLE deficits/detail RLE Deficits / Details: difficult to assess given patient's reported pain. patient is able to activate hip/knee/ankle movement. less active movement noted in RLE vs LLE, however question if this is due to guarding/pain  RLE Sensation: decreased light touch LLE Deficits / Details: patient is able to activate hip/knee/ankle movement in bed. difficut to assess given patient refusing to sit up on edge of bed and given body habitus  LLE Sensation: decreased light touch       Communication   Communication: No difficulties  Cognition Arousal/Alertness: Awake/alert Behavior During Therapy: WFL for tasks assessed/performed Overall Cognitive Status: Within Functional Limits for tasks assessed                                        General Comments      Exercises     Assessment/Plan    PT Assessment Patient needs continued PT services  PT Problem List Decreased strength;Decreased range of motion;Decreased activity tolerance;Decreased balance;Decreased mobility;Decreased knowledge of use of DME;Decreased safety awareness;Pain;Impaired sensation;Obesity       PT Treatment Interventions DME instruction;Gait training;Stair training;Functional mobility training;Therapeutic activities;Therapeutic exercise;Balance training;Neuromuscular re-education;Wheelchair mobility training;Patient/family education    PT Goals (Current goals can be found in the Care Plan section)  Acute Rehab PT Goals Patient Stated Goal: to go home  PT Goal Formulation: With patient Time For Goal Achievement:  09/01/20 Potential to Achieve Goals: Fair    Frequency Min 2X/week   Barriers to discharge Decreased caregiver support Patient adamant at this time about being discharged to home despite having aide only 3 days per week and living home alone.     Co-evaluation               AM-PAC PT "6 Clicks" Mobility  Outcome Measure Help needed turning from your back to your side while in a flat bed without using bedrails?: A Little Help needed moving from lying on your back to sitting on the side of a flat bed without using bedrails?: A Lot Help needed moving to and from a bed to a chair (including a wheelchair)?: A Lot Help needed standing up from a chair using your arms (e.g., wheelchair or bedside chair)?: A Lot Help needed to walk in hospital room?: A Lot Help needed climbing 3-5 steps with a railing? : Total 6 Click Score: 12    End of Session Equipment Utilized During Treatment: Oxygen Activity Tolerance: Patient limited by fatigue;Patient limited by lethargy (limited by willingess to progress mobility further )  Patient left: in bed;with call bell/phone within reach;with bed alarm set Nurse Communication: Mobility status PT Visit Diagnosis: Other abnormalities of gait and mobility (R26.89);Unsteadiness on feet (R26.81);Muscle weakness (generalized) (M62.81)    Time: 1327-1400 PT Time Calculation (min) (ACUTE ONLY): 33 min   Charges:   PT Evaluation $PT Eval Low Complexity: 1 Low PT Treatments $Therapeutic Activity: 8-22 mins       Minna Merritts, PT, MPT  Percell Locus 08/18/2020, 3:17 PM

## 2020-08-18 NOTE — Consult Note (Addendum)
Neurology Consultation Reason for Consult: 6th nerve palsy Referring Physician: Dr. Marva Panda  CC: "Everything's gone haywire"  History is obtained from: Patient and chart review  HPI: Brittney [name] is a left-handed (forced to use right hand as a child) 69 y.o. 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 Perhaps a day or two afterwards (she is not clear on the timeline), she noticed she had double vision that it was worse on looking to the right and better on looking to the left.  She does feel that the double vision has been worsening progressively, but is somewhat inconsistent in her reports as she does state that she has double vision even looking straight ahead but this was denied it during my examination.  She has been getting outpatient evaluation for this, was referred to ophthalmology after a CT head which was negative for acute intracranial processes.  She thinks that somebody told her it might have been related to her procedure.  Additionally her review of systems is fairly pan positive, for example she reports headache (takes Fioricet about once a month and last took it on Monday, August 16), photophobia, phonophobia, nausea, decreased hearing in her left ear (again not substantiated on examination later), difficulty swallowing, difficulty with word  finding for about 2 weeks (but then later she states this has been going on since the double vision), left hand weakness, bilateral lower extremity weakness, worsening shortness of breath, chest pain, worsening constipation (denies diarrhea), occasional burning when she pees, maybe blood in her urine though she is not sure, no blood in her stool, fatigue, temperature intolerance.  She denies a facial droop but reports "I do not want to look in the mirror because I do not want to see what I see" as well stating "It's hard to eat, I don't want to eat."  She reports she has been choking on food for a month or more, and has worsening of her chronic diabetic neuropathy in her feet especially the left foot. She reports her insulin pump is "going haywire too;" reading still seems high when she hasn't had anything to eat.  She is unable to remember her exact recent A1c but reports it has been improving and has now jumped up again.  She also reports worsening lower extremity edema.  She reports she has chronic right lower extremity weakness after her spinal cord stimulator placement.  She reports the stimulator helped initially but has since stopped working and she typically does not turn it on at all.  I personally confirmed with her that she is on multiple sedating medications at home including Xanax 0.5 mg up to 4 times a day (last taken on Friday, typically takes 1 tablet in the evening and 2 at bedtime, tries to separate her doses from her oxycodone doses), Fioricet as needed for migraines (1x/mo; last took Monday one week ago), cyclobenzaprine (takes daily), gabapentin (600 mg in the morning and afternoon, 1800 mg at bedtime),  tramadol (2 nightly), trazodone (at bedtime), Ambien (at bedtime).   LKW: Late July 2021 tPA given?: No, due to out of the window   Past Medical History:  Diagnosis Date  . Abdominal pain, left upper quadrant 12/11/2012  . Arthritis   . Asthma   . Broken leg 2014   fell twice and  broke same leg. had a bone stimulator for first break.  . Cancer ([place]) 2007   melanoma, left ear and back of left leg, removed from back  . Chronic kidney disease, stage 2 (mild)   . Chronic lower back pain   . Collagen vascular disease ([place])   . Coronary artery disease   . Diabetes mellitus without complication ([place])   . Diabetic nephropathy associated with secondary diabetes mellitus ([place])   . [place] 12/11/2012  . Gastroparesis   . Gross hematuria 12/11/2012  . Hepatic cirrhosis ([place])   . History of kidney stones   . Hyperlipidemia   . Hypertension   . Hypothyroidism   . Hypothyroidism   . IBS (irritable bowel syndrome)   . Kidney stone 12/11/2012  . Lower extremity edema   . Morbid obesity with BMI of 40.0-44.9, adult ([place])   . Myocardial infarction ([place]) 09/2013  . Nausea without vomiting 12/11/2012  . Nephrolithiasis   . Nephrolithiasis 04/26/2014  . Nonproliferative retinopathy due to secondary diabetes ([place])   . Numbness and tingling of right leg   . Peripheral vascular disease ([place])   . Renal colic 16/12/930  . Renal insufficiency   . Sciatica   . Sciatica 12/11/2012  . Skin cancer 08/2018  . Sleep apnea    sleep study coming in november  . Unilateral small kidney without contralateral hypertrophy     Family History  Problem Relation Age of Onset  . Heart disease Mother   . Cancer Mother   . Asthma Mother   . Diabetes Father   . Kidney disease Father   . Hypertension Father   . Breast cancer Neg Hx     Social History:  reports that she has never smoked. She has never used smokeless tobacco. She reports that she does not drink alcohol and does not use drugs.   Exam: Current vital signs: BP 135/61   Pulse 72   Temp 99.1 F (37.3 C) (Oral)   Resp 15   Ht '5\' 6"'  (1.676 m)   Wt (!) 145 kg   SpO2 96%   BMI 51.60 kg/m  Vital signs in last 24 hours: Temp:  [99.1 F (37.3 C)] 99.1 F (37.3 C) (08/22 2344) Pulse Rate:  [72-87] 72 (08/23 0730) Resp:   [13-25] 15 (08/23 0730) BP: (109-168)/(56-98) 135/61 (08/23 0700) SpO2:  [96 %-100 %] 96 % (08/23 0730) Weight:  [145 kg] 145 kg (08/23 0110)   Physical Exam  Constitutional: Obese and chronically ill-appearing Psych: Flat affect, appears almost tearful at times Eyes: No scleral injection HENT: No OP obstruction MSK: no joint deformities.  Cardiovascular: Normal rate and regular rhythm, perfusing all extremities well.  Respiratory: Effort normal, non-labored breathing GI: Soft.  No distension. There is no tenderness.  Skin: Bilateral erythema of the lower extremities, warm to touch, with some weeping wounds and significant pitting edema.  Neuro: Mental Status: Patient is awake, alert, oriented to person, place, month, year, date and situation. Patient is able to give a clear and coherent history though somewhat dysfluent speech, with some word finding difficulty and word substitutions (teeth instead of tines of fork, pediatrics instead  of orthopedics).  She is able to follow complex commands, has intact attention (days of the week backwards), and is able to repeat complex sentences accurately though with some intermittent pausing/hesitancy.  Cranial Nerves: II: Visual Fields are full. Pupils are equal, round, and reactive to light, 4-2 mm   III,IV, VI: She has limited abduction of the right eye, without concurrent nystagmus of the left eye, with double vision and distraction of gaze only.  At times she will not look up, but this is intermittent. V: Facial sensation is symmetric to light touch VII: Facial movement is symmetric with activation, with a mild left nasolabial fold flattening during casual conversation VIII: hearing is intact to voice and symmetric to finger rub bilaterally (she reports it is "more crackly" on the left) X: Uvula elevates symmetrically XI: Shoulder shrug is symmetric, as is head turn, though she reports perhaps some left-sided neck pain. XII: tongue is midline  without atrophy or fasciculations.  Motor: Tone is normal. Bulk is difficult to assess given obesity.  There was no pronator drift of either upper extremity even with prolonged distraction.  5/5 strength was present throughout the bilateral upper extremities though she did require significant coaching.  She was very pain limited in the bilateral lower extremities, yelling out with passive leg raise such that hip flexion could not be tested. On the left knee flexion, ankle dorsiflexion and plantar flexion was 5/5; on the right, she did only ankle dorsiflexion (ROM limited but at least 4+/5) and plantar flexion (5/5)  Sensory: Sensory exam is inconsistently reported on the face, but ultimately she reported symmetric.  She reports a longstanding chronic numbness of the right lower extremity since her spinal cord surgery.  She does have some diminished sensation in the lower extremities bilaterally, consistent with her known neuropathy. Deep Tendon Reflexes: Pain limited evaluation of the biceps and brachioradialis as patient would violently flinch. 2+ ankle jerks bilaterally.   Plantars: Toes are mute bilaterally. Cerebellar: Finger to nose is very slow bilaterally at end range but appears intact  I have reviewed labs in epic and the results pertinent to this consultation are: CK 597 Cr 0.84 No recent A1c (last 9.3 on 09/2014) TSH 8.14 (high) with normal T4 (8.4 in Feb 2021); repeat here is normal Lactate normal UA with hyaline casts and mild LE, negative ketones    No recent lipid panel, 2015 uncontrolled HLD:  Lab Results  Component Value Date   CHOL 280 (H) 10/23/2014   HDL 44 10/23/2014   LDLCALC 185 (H) 10/23/2014   TRIG 257 (H) 10/23/2014     I have reviewed the images obtained: HCT August 23rd 2021 without acute intracranial process, chronic microvascular changes CTA head July 22, 2020 (no neck vessel imaging obtained), scattered atherosclerotic disease worse in the left MCA than  right MCA  Impression: Ms. Brittney [name] is a medically complex woman with multiple stroke risk factors as detailed above.  Her exam is challenging due to pain/effort.  It is difficult to be sure whether her language issues are due to a CNS lesion versus her general medical condition and anxiety/depression.  Aside from this and the isolated cranial nerve VI palsy on the right, there is no other clear evidence of neurologic dysfunction, specifically, no clear weakness of the upper extremities.  The most common cause of an isolated cranial nerve VI palsy in this demographic with these stroke risk factors is microvascular disease secondary to small vessel risk factors.  However, given her history  of melanoma and the report of this double vision possibly worsening over time, I will obtain a MRI brain with and without contrast, and have communicated to the technicians to obtain thin cuts through the brainstem.  Will also obtain stroke labs as detailed below.  Given her claustrophobia, will also obtain CTA head and neck to rule out dissection and obtain good vessel imaging as she also has left-sided neck pain.  Given her left-handedness, her language may be localized to either MCA territory.  It is possible that her intermittent hypotension/hypoxemia is contributing to her word finding difficulties (especially given her chronic baseline hypoxia).  Recommendations: -Follow-up hemoglobin A1c, lipid panel, TSH, ESR, RPR -MRI brain with and without contrast -CTA head and neck -TTE -Neurology will follow-up these results  [place] [phone]

## 2020-08-18 NOTE — ED Notes (Signed)
Pt placed on bed pan and experienced a successful BM at this time, returned to position of comfort and lights turned off in room. Call bell remains in reach for pt

## 2020-08-18 NOTE — Progress Notes (Signed)
Brittney [name] is a 69 y.o. female with a history of morbid obesity, diabetes, hypothyroidism, hypertension, venous stasis, COPD, OSA on CPAP, CAD, chronic back pain with spinal cord stimulator, chronic hypoxia on O2 at rest, below s/p left TKA who presented to the ED from home after being on her floor next to her bed for 2 days and does not recall a fall or syncopal event anything leading up to this event who was admitted early this morning by Dr. Damita Dunnings for bilateral lower extremity cellulitis.  Significant recent history leading up to hospitalization: Patient reports that she was in her usual state of health until she underwent an endoscopy on 7/23 for variceal screening at which point recalls having double vision ever since she woke up from anesthesia.  She was seen by her physician who reported a 6th nerve palsy on the right.  She had a CTA head on 7/27 which showed a small chronic cerebellar infarct as well as intracranial atherosclerosis including a moderate to severe stenosis at the left MCA bifurcation to moderate left P1 and Severe left P2 stenosis.  Since that time the patient has had worsening fatigue and at times has noted speech changes and word finding difficulty.  Even noted that while she was on the phone last week the phone kept slipping from her hand.  PE: General: Awake and alert, no acute distress  HEENT: Right eye erythematous Heart: S1 and S2 auscultated, no murmurs  Lungs: Clear to auscultation bilaterally, no wheeze  Neuro: Right 6th nerve palsy with diplopia with vision to right.  MSK: RUE 4/5 muscle strength, LUE 5/5 muscle strength, bilateral lower extremity 3/5 muscle strength Skin: Bilateral lower extremity rash as seen in the picture      A/P  1. S/P fall known etiology concerning for syncopal episode +/- stroke vs. Polypharmacy a. Echo b. Tele c. Neurology consulted for neurologic work up d. PT  2. Mild rhabdomyolysis secondary to fall and laying on the  floor x 2 days a. IV fluids  3. 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 Occurred post recent endoscopic procedure b. Neurology consulted  4. Cerebrovascular disease a. Lipid panel b. HA1c  5. Bilateral lower extremity nonpurulent cellulitis a. Afebrile, leukocytosis on presentation now improved b. Rocephin to Ancef  6. Fatigue, likely multifactorial: polypharmacy, debility, concern for underlying neurologic issues a. TSH unremarkable b. Hold sedating medications   7. Chronic pain a. Has a spinal cord stimulator b. Continue home percocet c. Continue gabapentin  8. Diabetes a. Patient can use her insulin pump   9. Constipation a. Continue linzess  10. Hypertension a. Continue home coreg and hydralazine, holding home ARB  11. Chronic hypoxemic respiratory failure with moderate persistent asthma a. Follows with pulm b. Continue O2  12. OSA a. cpap  13. Chronic lymphedema a. Continue home lasix and metolazone  14. Depression a. Continue home med  15. Debility a. [place], DO [facility] Pager [phone]

## 2020-08-18 NOTE — H&P (Addendum)
History and Physical    Brittney [name]:[id number] DOB: 1951/05/19 DOA: 08/17/2020  PCP: Rusty Aus, MD   Patient coming from: Home  I have personally briefly reviewed patient's old medical records in [place]  Chief Complaint: Fall on floor, unable to get up  HPI: Brittney [name] is a 69 y.o. female with medical history significant for obesity, diabetes with multiple complications including retinopathy, PVD, gastroparesis, surgical hypothyroidism, HTN, venous stasis, COPD, OSA on CPAP, CAD, chronic back pain, OA status post left TKA on chronic narcotics, history of falls who presents to the emergency room after laying on the floor for 2 days following an episode where she did not quite fall but had to ease herself onto the floor to prevent herself from falling.  Patient denies preceding dizziness, lightheadedness, chest pains or shortness of breath or palpitations.  Denied recent illness.  Denies nausea vomiting abdominal pain or change in bowel habits and has had no fever or chills.  Patient reports limited mobility over the past year to the point where she is unable to transfer easily even with her walker, unable to give herself a bath and feels that she has declined significantly in her ability to care for herself.  She has an aide 3 days a week. ED Course: On arrival she had a low-grade temperature of 99.1, mild tachypnea of 22 with otherwise unremarkable vitals.  Blood work significant for CK of 597 but for the most part mostly unremarkable with mildly elevated WBC of 94709.  Covid negative CT head with no acute intracranial findings.  Chest x-ray no acute disease. EKG with sinus rhythm and no acute ST-T wave changes.  Patient was found to have redness bilateral lower extremities and started on Rocephin for possible cellulitis.  Hospitalist consulted for admission.  Review of Systems: As per HPI otherwise all other systems on review of systems negative.    Past Medical  History:  Diagnosis Date  . Abdominal pain, left upper quadrant 12/11/2012  . Arthritis   . Asthma   . Broken leg 2014   fell twice and broke same leg. had a bone stimulator for first break.  . Cancer ([place]) 2007   melanoma, left ear and back of left leg, removed from back  . Chronic kidney disease, stage 2 (mild)   . Chronic lower back pain   . Collagen vascular disease ([place])   . Coronary artery disease   . Diabetes mellitus without complication ([place])   . Diabetic nephropathy associated with secondary diabetes mellitus ([place])   . [place] 12/11/2012  . Gastroparesis   . Gross hematuria 12/11/2012  . Hepatic cirrhosis ([place])   . History of kidney stones   . Hyperlipidemia   . Hypertension   . Hypothyroidism   . Hypothyroidism   . IBS (irritable bowel syndrome)   . Kidney stone 12/11/2012  . Lower extremity edema   . Morbid obesity with BMI of 40.0-44.9, adult ([place])   . Myocardial infarction ([place]) 09/2013  . Nausea without vomiting 12/11/2012  . Nephrolithiasis   . Nephrolithiasis 04/26/2014  . Nonproliferative retinopathy due to secondary diabetes ([place])   . Numbness and tingling of right leg   . Peripheral vascular disease ([place])   . Renal colic 62/83/6629  . Renal insufficiency   . Sciatica   . Sciatica 12/11/2012  . Skin cancer 08/2018  . Sleep apnea    sleep study coming in november  . Unilateral small kidney without contralateral hypertrophy  Past Surgical History:  Procedure Laterality Date  . ABDOMINAL HYSTERECTOMY    . BACK SURGERY  04/1999   L-4-5 LAMINECTOMY. metal in lower back and neck  . CESAREAN SECTION  1980  . CHOLECYSTECTOMY    . COLONOSCOPY  05/04/2001  . COLONOSCOPY WITH PROPOFOL N/A 09/20/2016   Procedure: COLONOSCOPY WITH PROPOFOL;  Surgeon: Lollie Sails, MD;  Location: [facility] ENDOSCOPY;  Service: Endoscopy;  Laterality: N/A;  . DILATION AND CURETTAGE OF UTERUS    . ESOPHAGOGASTRODUODENOSCOPY  02/08/2014  . ESOPHAGOGASTRODUODENOSCOPY (EGD)  WITH PROPOFOL N/A 09/20/2016   Procedure: ESOPHAGOGASTRODUODENOSCOPY (EGD) WITH PROPOFOL;  Surgeon: Lollie Sails, MD;  Location: [facility] ENDOSCOPY;  Service: Endoscopy;  Laterality: N/A;  . ESOPHAGOGASTRODUODENOSCOPY (EGD) WITH PROPOFOL N/A 05/23/2018   Procedure: ESOPHAGOGASTRODUODENOSCOPY (EGD) WITH PROPOFOL;  Surgeon: Lollie Sails, MD;  Location: [facility] ENDOSCOPY;  Service: Endoscopy;  Laterality: N/A;  . ESOPHAGOGASTRODUODENOSCOPY (EGD) WITH PROPOFOL N/A 07/18/2020   Procedure: ESOPHAGOGASTRODUODENOSCOPY (EGD) WITH PROPOFOL;  Surgeon: Lesly Rubenstein, MD;  Location: [facility] ENDOSCOPY;  Service: Gastroenterology;  Laterality: N/A;  . EYE SURGERY  1986   FOR MELANOMA  . HERNIA REPAIR    . JOINT REPLACEMENT Left 09/24/2013   TOTAL KNEE  . MOHS SURGERY  11/2016   left side of nose  . PULSE GENERATOR IMPLANT N/A 11/02/2017   Procedure: UNILATERAL PULSE GENERATOR IMPLANT;  Surgeon: Meade Maw, MD;  Location: [facility] ORS;  Service: Neurosurgery;  Laterality: N/A;  . TONSILLECTOMY     WITH ADENOIDECTOMY     reports that she has never smoked. She has never used smokeless tobacco. She reports that she does not drink alcohol and does not use drugs.  Allergies  Allergen Reactions  . Eggs Or Egg-Derived Products Nausea And Vomiting    Only has reaction to flu vaccine due to eggs, no reaction to egg food products  . Shellfish Allergy Nausea And Vomiting, Diarrhea and Nausea Only    Non stop sickness once ingests shellfish. Betadine is okay  . Amlodipine Cough  . Codeine Nausea And Vomiting and Nausea Only    states she can take codeine cough syrup without problems states she can take codeine cough syrup without problems  . Imdur [Isosorbide Dinitrate] Other (See Comments)    Headache  . Lyrica [Pregabalin] Swelling and Other (See Comments)    "FLU-LIKE" SYMPTOMS  . Monosodium Glutamate Other (See Comments)    Headache  . Tape Other (See Comments) and Nausea And Vomiting    can use  paper tape Redness    Family History  Problem Relation Age of Onset  . Heart disease Mother   . Cancer Mother   . Asthma Mother   . Diabetes Father   . Kidney disease Father   . Hypertension Father   . Breast cancer Neg Hx       Prior to Admission medications   Medication Sig Start Date End Date Taking? Authorizing Provider  albuterol (VENTOLIN HFA) 108 (90 Base) MCG/ACT inhaler Inhale 2 puffs into the lungs 3 (three) times daily as needed for wheezing or shortness of breath.     [provider]  ALPRAZolam Duanne Moron) 0.5 MG tablet Take 0.5 mg by mouth 4 (four) times daily.     [provider]  butalbital-acetaminophen-caffeine (FIORICET, ESGIC) 50-325-40 MG tablet Take 1 tablet by mouth 2 (two) times daily as needed for migraine.     [provider]  carvedilol (COREG) 25 MG tablet Take 50 mg by mouth 2 (two) times daily  with a meal.     [provider]  cloNIDine (CATAPRES - DOSED IN MG/24 HR) 0.3 mg/24hr patch Place 0.3 mg onto the skin once a week.     [provider]  cyclobenzaprine (FLEXERIL) 10 MG tablet Take 20 mg by mouth at bedtime. 12/19/19   [provider]  doxazosin (CARDURA) 4 MG tablet Take 4 mg by mouth every evening.     [provider]  escitalopram (LEXAPRO) 10 MG tablet Take 10 mg by mouth daily.  12/13/12   [provider]  fluticasone (FLONASE) 50 MCG/ACT nasal spray Place 2 sprays into both nostrils as needed.     [provider]  gabapentin (NEURONTIN) 600 MG tablet Take 3,000 mg by mouth at bedtime. Take 1 tablet (600mg ) by mouth twice daily and take 3 tablets (1800mg ) by mouth at bedtime 06/06/19   [provider]  hydrALAZINE (APRESOLINE) 50 MG tablet Take 50 mg by mouth 3 (three) times daily.     [provider]  ibuprofen (ADVIL,MOTRIN) 800 MG tablet Take 800 mg by mouth 3 (three) times daily as needed for mild pain or moderate pain.     [provider]   Insulin Detemir (LEVEMIR FLEXTOUCH) 100 UNIT/ML Pen Inject 140 Units into the skin 2 (two) times daily.     [provider]  Insulin Human (INSULIN PUMP) SOLN Inject into the skin.    [provider]  insulin regular human CONCENTRATED (HUMULIN R) 500 UNIT/ML kwikpen Inject 40-80 Units into the skin See admin instructions. Inject 80u under the skin at breakfast, inject 65-70u under the skin at lunch and inject 40-80u under the skin at dinner time based on blood glucose readings Patient not taking: Reported on 07/18/2020    [provider]  levothyroxine (SYNTHROID) 200 MCG tablet Take 200 mcg by mouth daily before breakfast.    [provider]  LINZESS 290 MCG CAPS capsule Take 290 mcg by mouth daily. 07/17/19   [provider]  metoCLOPramide (REGLAN) 10 MG tablet Take 10 mg by mouth 3 (three) times daily. 12/11/12   [provider]  metolazone (ZAROXOLYN) 5 MG tablet Take 5 mg by mouth 2 (two) times a week. 01/24/20 01/23/21  [provider]  ondansetron (ZOFRAN) 4 MG tablet Take 4 mg by mouth every 12 (twelve) hours as needed for nausea or vomiting.  05/14/19   [provider]  oxyCODONE-acetaminophen (PERCOCET) 10-325 MG tablet Take 1 tablet by mouth every 12 (twelve) hours as needed for pain. Must last 30 days. 07/28/20 08/27/20  [name], MD  oxyCODONE-acetaminophen (PERCOCET) 10-325 MG tablet Take 1 tablet by mouth every 12 (twelve) hours as needed for pain. Must last 30 days. 08/27/20 09/26/20  [name], MD  pantoprazole (PROTONIX) 40 MG tablet Take 40 mg by mouth 2 (two) times daily.     [provider]  potassium chloride (K-DUR) 10 MEQ tablet Take 10 mEq by mouth daily.    [provider]  pramipexole (MIRAPEX) 0.5 MG tablet Take 0.5 mg by mouth 2 (two) times daily.     [provider]  rosuvastatin (CRESTOR) 20 MG tablet Take 20 mg by mouth daily. Patient not taking: Reported on 06/24/2020  12/06/19 12/05/20  [provider]  traMADol (ULTRAM) 50 MG tablet Take 100 mg by mouth at bedtime as needed.     [provider]  traZODone (DESYREL) 150 MG tablet Take 150 mg by mouth at bedtime.  12/11/12  [provider]  zolpidem (AMBIEN) 10 MG tablet Take 10 mg by mouth at bedtime.  12/11/12   [provider]    Physical Exam: Vitals:   08/17/20 2344 08/18/20 0000 08/18/20 0030 08/18/20 0110  BP: (!) 167/81 (!) 153/74 (!) 168/98   Pulse: 86 87 84   Resp: (!) 22 19 16    Temp: 99.1 F (37.3 C)     TempSrc: Oral     SpO2: 98% 100% 100%   Weight:    (!) 145 kg  Height:    5\' 6"  (1.676 m)     Vitals:   08/17/20 2344 08/18/20 0000 08/18/20 0030 08/18/20 0110  BP: (!) 167/81 (!) 153/74 (!) 168/98   Pulse: 86 87 84   Resp: (!) 22 19 16    Temp: 99.1 F (37.3 C)     TempSrc: Oral     SpO2: 98% 100% 100%   Weight:    (!) 145 kg  Height:    5\' 6"  (1.676 m)      Constitutional: Alert and oriented x 3.  Appears very depressed and tearful.  HEENT:      Head: Normocephalic and atraumatic.         Eyes: PERLA, EOMI, Conjunctivae are normal. Sclera is non-icteric.       Mouth/Throat: Mucous membranes are moist.       Neck: Supple with no signs of meningismus. Cardiovascular: Regular rate and rhythm. No murmurs, gallops, or rubs. 2+ symmetrical distal pulses are present . No JVD. No 2-3+LE edema Respiratory: Respiratory effort normal .Lungs sounds clear bilaterally. No wheezes, crackles, or rhonchi.  Gastrointestinal: Soft, non tender, and non distended with positive bowel sounds. No rebound or guarding. Genitourinary: No CVA tenderness. Musculoskeletal: Nontender with normal range of motion in all extremities.  Redness swelling and warmth bilateral lower extremities Neurologic: Normal speech and language. Face is symmetric. Moving all extremities. No gross focal neurologic deficits . Skin:  Redness swelling and warmth bilateral lower  extremities Psychiatric: Depressed mood and affect otherwise behavior normal  Labs on Admission: I have personally reviewed following labs and imaging studies  CBC: Recent Labs  Lab 08/17/20 2351  WBC 10.7*  NEUTROABS 8.1*  HGB 13.2  HCT 40.8  MCV 88.1  PLT 294   Basic Metabolic Panel: Recent Labs  Lab 08/17/20 2351  NA 139  K 3.6  CL 96*  CO2 33*  GLUCOSE 103*  BUN 16  CREATININE 0.84  CALCIUM 10.1   GFR: Estimated Creatinine Clearance: 96 mL/min (by C-G formula based on SCr of 0.84 mg/dL). Liver Function Tests: Recent Labs  Lab 08/17/20 2351  AST 55*  ALT 23  ALKPHOS 96  BILITOT 0.7  PROT 7.4  ALBUMIN 3.5   No results for input(s): LIPASE, AMYLASE in the last 168 hours. No results for input(s): AMMONIA in the last 168 hours. Coagulation Profile: Recent Labs  Lab 08/18/20 0000  INR 0.9   Cardiac Enzymes: Recent Labs  Lab 08/17/20 2351  CKTOTAL 597*   BNP (last 3 results) No results for input(s): PROBNP in the last 8760 hours. HbA1C: No results for input(s): HGBA1C in the last 72 hours. CBG: No results for input(s): GLUCAP in the last 168 hours. Lipid Profile: No results for input(s): CHOL, HDL, LDLCALC, TRIG, CHOLHDL, LDLDIRECT in the last 72 hours. Thyroid Function Tests: No results for input(s): TSH, T4TOTAL, FREET4, T3FREE, THYROIDAB in the last 72 hours. Anemia Panel: No results for input(s): VITAMINB12, FOLATE, FERRITIN, TIBC, IRON, RETICCTPCT  in the last 72 hours. Urine analysis:    Component Value Date/Time   COLORURINE YELLOW (A) 08/18/2020 0011   APPEARANCEUR HAZY (A) 08/18/2020 0011   APPEARANCEUR Clear 10/22/2014 1539   LABSPEC 1.015 08/18/2020 0011   LABSPEC 1.010 10/22/2014 1539   PHURINE 6.0 08/18/2020 0011   GLUCOSEU NEGATIVE 08/18/2020 0011   GLUCOSEU 150 mg/dL 10/22/2014 1539   HGBUR SMALL (A) 08/18/2020 0011   BILIRUBINUR NEGATIVE 08/18/2020 0011   BILIRUBINUR Negative 10/22/2014 1539   KETONESUR NEGATIVE 08/18/2020  0011   PROTEINUR 100 (A) 08/18/2020 0011   UROBILINOGEN 0.2 07/03/2009 1105   NITRITE NEGATIVE 08/18/2020 0011   LEUKOCYTESUR TRACE (A) 08/18/2020 0011   LEUKOCYTESUR Negative 10/22/2014 1539    Radiological Exams on Admission: CT Head Wo Contrast  Result Date: 08/18/2020 CLINICAL DATA:  Syncope EXAM: CT HEAD WITHOUT CONTRAST TECHNIQUE: Contiguous axial images were obtained from the base of the skull through the vertex without intravenous contrast. COMPARISON:  None. FINDINGS: Brain: There is no mass, hemorrhage or extra-axial collection. The size and configuration of the ventricles and extra-axial CSF spaces are normal. The brain parenchyma is normal, without acute infarction. 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 Vascular: No abnormal hyperdensity of the major intracranial arteries or dural venous sinuses. No intracranial atherosclerosis. Skull: The visualized skull base, calvarium and extracranial soft tissues are normal. Sinuses/Orbits: No fluid levels or advanced mucosal thickening of the visualized paranasal sinuses. No mastoid or middle ear effusion. The orbits are normal. IMPRESSION: 1. No acute intracranial abnormality. 2. 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 3. Unchanged left cerebellar infarct. Electronically Signed   By: Ulyses Jarred M.D.   On: 08/18/2020 01:18   DG Chest Port 1 View  Result Date: 08/18/2020 CLINICAL DATA:  Sepsis EXAM: PORTABLE CHEST 1 VIEW COMPARISON:  07/30/2019 FINDINGS: The heart size and mediastinal contours are within normal limits. Both lungs are clear. The visualized skeletal structures are unremarkable. IMPRESSION: No active disease. Electronically Signed   By: Ulyses Jarred M.D.   On: 08/18/2020 00:44    EKG: Independently reviewed. Interpretation : Normal sinus rhythm no acute ST-T wave changes Assessment/Plan 69 year old female  with history of obesity, diabetes with multiple complications including retinopathy, PVD, gastroparesis, surgical hypothyroidism, HTN, venous stasis, COPD, OSA on CPAP, CAD, chronic back pain, OA status post left TKA on chronic narcotics, history of falls presenting following a fall without injury in which she stayed on the floor for 2 days.     Bilateral lower leg cellulitis   Venous stasis of both lower extremities -IV Rocephin and keep legs elevated    Rhabdomyolysis -Secondary to fall -CK over 500 -IV hydration and monitor CK    Fall at home, initial encounter -Fall appears to be accidental but could also be related to multiple sedating prescription psychotropic and narcotic medication -Medication review -Cardiac monitoring.  Can consider additional syncopal work-up if deemed necessary -Patient does follow with cardiology  Coronary artery disease -No complaints of chest pain.  EKG nonacute.  Troponin not done from the ER but low suspicion for CAD  Physical deconditioning Chronic back pain, chronic osteoarthritic pain -Physical therapy assessment    OSA (obstructive sleep apnea) -CPAP    Essential hypertension -Continue home meds pending med rec    Long term current use of opiate analgesic -Judicious opiate use in view of suspicion for contribution to fall    Morbid obesity with BMI  of 50.0-59.9, adult ([place]) -This complicates overall prognosis and care    Type 2 diabetes mellitus with diabetic polyneuropathy, with long-term current use of insulin ([place]) -Sliding scale insulin coverage with addition of home meds pending med rec      DVT prophylaxis: Lovenox  Code Status: full code  Family Communication:  none  Disposition Plan: Back to previous home environment Consults called: none  Status: Observation    Athena Masse MD [facility]     08/18/2020, 2:53 AM

## 2020-08-18 NOTE — Progress Notes (Signed)
Pt will need to run a test to see if Stimulator is conditional for MRI. (I called the manufacture and as we were trying to run the test we found the remote wasn't charged properly. It is suposed to be charged daily. The daughter did not bring the charger so I have called the local represntative to see if they can just come tomorrow and see if they can check and verify that this sitmulator will not pass or if by a small chance it will.Also the local reps number is [phone]. They will page someone. I have already called and left a message.) Pt and daughter were informed.  Last MRI in 04/2020 pt was unable to have MR due to test failing per representative.

## 2020-08-18 NOTE — ED Notes (Signed)
Pt has insulin pump on at this time.

## 2020-08-18 NOTE — ED Notes (Signed)
Pt had removed call bell from railing and was attempting to find button and unable to, pt called daughter who called ED, when this nurse was in room the ED secretary called this nurse to inform of situation. Pt is very anxious, PRN meds were administered. Pt returned to position of comfort

## 2020-08-18 NOTE — ED Notes (Signed)
Pt given graham crackers and diet ginger ale ?

## 2020-08-18 NOTE — Progress Notes (Signed)
*  PRELIMINARY RESULTS* Echocardiogram 2D Echocardiogram has been performed.  Brittney [name] 08/18/2020, 7:59 PM

## 2020-08-19 ENCOUNTER — Encounter: Payer: Self-pay | Admitting: Internal Medicine

## 2020-08-19 DIAGNOSIS — E1142 Type 2 diabetes mellitus with diabetic polyneuropathy: Secondary | ICD-10-CM

## 2020-08-19 DIAGNOSIS — R410 Disorientation, unspecified: Secondary | ICD-10-CM

## 2020-08-19 DIAGNOSIS — E876 Hypokalemia: Secondary | ICD-10-CM

## 2020-08-19 DIAGNOSIS — Z6841 Body Mass Index (BMI) 40.0 and over, adult: Secondary | ICD-10-CM

## 2020-08-19 DIAGNOSIS — Z794 Long term (current) use of insulin: Secondary | ICD-10-CM

## 2020-08-19 DIAGNOSIS — T796XXA Traumatic ischemia of muscle, initial encounter: Secondary | ICD-10-CM

## 2020-08-19 DIAGNOSIS — W19XXXA Unspecified fall, initial encounter: Secondary | ICD-10-CM

## 2020-08-19 DIAGNOSIS — Y92009 Unspecified place in unspecified non-institutional (private) residence as the place of occurrence of the external cause: Secondary | ICD-10-CM

## 2020-08-19 DIAGNOSIS — H4921 Sixth [abducent] nerve palsy, right eye: Secondary | ICD-10-CM

## 2020-08-19 LAB — MAGNESIUM: Magnesium: 1.7 mg/dL (ref 1.7–2.4)

## 2020-08-19 LAB — HEMOGLOBIN A1C
Hgb A1c MFr Bld: 7.3 % — ABNORMAL HIGH (ref 4.8–5.6)
Mean Plasma Glucose: 163 mg/dL

## 2020-08-19 LAB — GLUCOSE, CAPILLARY
Glucose-Capillary: 184 mg/dL — ABNORMAL HIGH (ref 70–99)
Glucose-Capillary: 214 mg/dL — ABNORMAL HIGH (ref 70–99)
Glucose-Capillary: 263 mg/dL — ABNORMAL HIGH (ref 70–99)
Glucose-Capillary: 261 mg/dL — ABNORMAL HIGH (ref 70–99)

## 2020-08-19 LAB — CBC
HCT: 37 % (ref 36.0–46.0)
Hemoglobin: 12.5 g/dL (ref 12.0–15.0)
MCH: 29.1 pg (ref 26.0–34.0)
MCHC: 33.8 g/dL (ref 30.0–36.0)
MCV: 86 fL (ref 80.0–100.0)
Platelets: 194 10*3/uL (ref 150–400)
RBC: 4.3 MIL/uL (ref 3.87–5.11)
RDW: 13.8 % (ref 11.5–15.5)
WBC: 7.6 10*3/uL (ref 4.0–10.5)
nRBC: 0 % (ref 0.0–0.2)

## 2020-08-19 LAB — BASIC METABOLIC PANEL
Anion gap: 13 (ref 5–15)
BUN: 10 mg/dL (ref 8–23)
CO2: 33 mmol/L — ABNORMAL HIGH (ref 22–32)
Calcium: 10.2 mg/dL (ref 8.9–10.3)
Chloride: 94 mmol/L — ABNORMAL LOW (ref 98–111)
Creatinine, Ser: 0.73 mg/dL (ref 0.44–1.00)
GFR calc Af Amer: >60 mL/min (ref >60)
GFR calc non Af Amer: >60 mL/min (ref >60)
Glucose, Bld: 163 mg/dL — ABNORMAL HIGH (ref 70–99)
Potassium: 3 mmol/L — ABNORMAL LOW (ref 3.5–5.1)
Sodium: 140 mmol/L (ref 135–145)

## 2020-08-19 LAB — ECHOCARDIOGRAM COMPLETE
AR max vel: 1.83 cm2
AV Peak grad: 12.7 mmHg
Ao pk vel: 1.78 m/s
Area-P 1/2: 6.12 cm2
Height: 66 in
S' Lateral: 3.5 cm
Weight: 5114.67 oz

## 2020-08-19 LAB — CK: Total CK: 195 U/L (ref 38–234)

## 2020-08-19 MED ORDER — INSULIN GLARGINE 100 UNIT/ML ~~LOC~~ SOLN
35.0000 [IU] | Freq: Every day | SUBCUTANEOUS | Status: DC
  Administered 2020-08-19: 35 [IU] via SUBCUTANEOUS
  Filled 2020-08-19 (×2): qty 0.35

## 2020-08-19 MED ORDER — PROMETHAZINE HCL 25 MG/ML IJ SOLN
INTRAMUSCULAR | Status: AC
  Filled 2020-08-19: qty 1

## 2020-08-19 MED ORDER — LORAZEPAM 2 MG/ML IJ SOLN
1.0000 mg | Freq: Once | INTRAMUSCULAR | Status: AC
  Administered 2020-08-19: 1 mg via INTRAVENOUS
  Filled 2020-08-19: qty 1

## 2020-08-19 MED ORDER — MELATONIN 5 MG PO TABS
2.5000 mg | ORAL_TABLET | Freq: Every day | ORAL | Status: DC
  Administered 2020-08-19 – 2020-08-20 (×2): 2.5 mg via ORAL
  Filled 2020-08-19 (×2): qty 1

## 2020-08-19 MED ORDER — INSULIN ASPART 100 UNIT/ML ~~LOC~~ SOLN
0.0000 [IU] | Freq: Three times a day (TID) | SUBCUTANEOUS | Status: DC
  Administered 2020-08-19: 11 [IU] via SUBCUTANEOUS
  Filled 2020-08-19: qty 1

## 2020-08-19 MED ORDER — POTASSIUM CHLORIDE CRYS ER 20 MEQ PO TBCR
40.0000 meq | EXTENDED_RELEASE_TABLET | Freq: Once | ORAL | Status: AC
  Administered 2020-08-19: 40 meq via ORAL
  Filled 2020-08-19: qty 2

## 2020-08-19 MED ORDER — PROMETHAZINE HCL 25 MG/ML IJ SOLN
12.5000 mg | Freq: Once | INTRAMUSCULAR | Status: AC
  Administered 2020-08-19: 12.5 mg via INTRAVENOUS

## 2020-08-19 MED ORDER — IPRATROPIUM-ALBUTEROL 0.5-2.5 (3) MG/3ML IN SOLN: 3.0000 mL | Freq: Four times a day (QID) | RESPIRATORY_TRACT | Status: DC | PRN

## 2020-08-19 MED ORDER — NAPHAZOLINE-GLYCERIN 0.012-0.2 % OP SOLN
1.0000 [drp] | Freq: Four times a day (QID) | OPHTHALMIC | Status: AC
  Administered 2020-08-19 (×2): 2 [drp] via OPHTHALMIC
  Administered 2020-08-19 – 2020-08-20 (×5): 1 [drp] via OPHTHALMIC
  Administered 2020-08-21: 2 [drp] via OPHTHALMIC
  Filled 2020-08-19: qty 15

## 2020-08-19 MED ORDER — ROSUVASTATIN CALCIUM 10 MG PO TABS
40.0000 mg | ORAL_TABLET | Freq: Every day | ORAL | Status: DC
  Administered 2020-08-19 – 2020-08-20 (×2): 40 mg via ORAL
  Filled 2020-08-19 (×2): qty 4

## 2020-08-19 MED ORDER — CLONIDINE HCL 0.3 MG/24HR TD PTWK
0.3000 mg | MEDICATED_PATCH | TRANSDERMAL | Status: DC
  Administered 2020-08-19: 0.3 mg via TRANSDERMAL
  Filled 2020-08-19: qty 1

## 2020-08-19 MED ORDER — ALPRAZOLAM 0.5 MG PO TABS
0.5000 mg | ORAL_TABLET | Freq: Once | ORAL | Status: AC
  Administered 2020-08-19: 0.5 mg via ORAL

## 2020-08-19 MED ORDER — PANTOPRAZOLE SODIUM 40 MG PO TBEC
40.0000 mg | DELAYED_RELEASE_TABLET | Freq: Two times a day (BID) | ORAL | Status: DC
  Administered 2020-08-19 – 2020-08-21 (×5): 40 mg via ORAL
  Filled 2020-08-19 (×5): qty 1

## 2020-08-19 MED ORDER — MAGNESIUM SULFATE 2 GM/50ML IV SOLN
2.0000 g | Freq: Once | INTRAVENOUS | Status: AC
  Administered 2020-08-19: 2 g via INTRAVENOUS
  Filled 2020-08-19: qty 50

## 2020-08-19 MED ORDER — INSULIN ASPART 100 UNIT/ML ~~LOC~~ SOLN: 0.0000 [IU] | Freq: Three times a day (TID) | SUBCUTANEOUS | Status: DC

## 2020-08-19 MED ORDER — HYDRALAZINE HCL 20 MG/ML IJ SOLN
10.0000 mg | Freq: Once | INTRAMUSCULAR | Status: AC
  Administered 2020-08-19: 10 mg via INTRAVENOUS
  Filled 2020-08-19: qty 1

## 2020-08-19 NOTE — TOC Initial Note (Signed)
Transition of Care [facility]) - Initial/Assessment Note    Patient Details  Name: Brittney [name] MRN: [id number] Date of Birth: 03-28-1951  Transition of Care [facility]) CM/SW Contact:    Shelbie Ammons, RN Phone Number: 08/19/2020, 10:21 AM  Clinical Narrative:   RNCM met with patient at bedside, daughter Brittney [name] is present as well. Patient lying in bed with no acute distress noted waiting to have bath per CNA. Daughter Brittney [name] doing most of talking and requesting assistance with patient getting extra hours alloted to her for her personal care services. RNCM relayed that this is not something that is arranged through the hospital but that contact could be made with managing organization. She gets her services throug th [facility] and this is managed by [facility]. Pam is the contact with [facility] her number is [phone]. Patient is agreeable to having home health services initiated again and requests they be through [facility] that she has had in the past.  RNCM reached out to [facility] with [facility] and left VM for return call RNCM reached out to [place] with [facility] and he will accept referral for home health.            Expected Discharge Plan: [place] Barriers to Discharge: No Barriers Identified   Patient Goals and CMS Choice     Choice offered to / list presented to : Patient  Expected Discharge Plan and Services Expected Discharge Plan: [place]   Discharge Planning Services: CM Consult Post Acute Care Choice: [place] arrangements for the past 2 months: Single Family Home                           HH Arranged: RN, PT, OT [facility] Agency: [place] ([place]) Date HH Agency Contacted: 08/19/20 Time HH Agency Contacted: 23 Representative spoke with at [place]: [place] Arrangements/Services Living arrangements for the past 2 months: [place] with:: Self Patient language  and need for interpreter reviewed:: Yes Do you feel safe going back to the place where you live?: Yes      Need for Family Participation in Patient Care: Yes (Comment) Care giver support system in place?: Yes (comment)   Criminal Activity/Legal Involvement Pertinent to Current Situation/Hospitalization: No - Comment as needed  Activities of Daily Living Home Assistive Devices/Equipment: Walker (specify type) ADL Screening (condition at time of admission) Patient's cognitive ability adequate to safely complete daily activities?: Yes Is the patient deaf or have difficulty hearing?: Yes Does the patient have difficulty seeing, even when wearing glasses/contacts?: Yes Does the patient have difficulty concentrating, remembering, or making decisions?: Yes Patient able to express need for assistance with ADLs?: Yes Does the patient have difficulty dressing or bathing?: Yes Independently performs ADLs?: No Communication: Independent Dressing (OT): Needs assistance Is this a change from baseline?: Pre-admission baseline Grooming: Needs assistance Is this a change from baseline?: Pre-admission baseline Feeding: Needs assistance Is this a change from baseline?: Pre-admission baseline Bathing: Needs assistance Is this a change from baseline?: Pre-admission baseline Toileting: Needs assistance In/Out Bed: Needs assistance Is this a change from baseline?: Pre-admission baseline Walks in Home: Needs assistance Is this a change from baseline?: Pre-admission baseline Does the patient have difficulty walking or climbing stairs?: Yes Weakness of Legs: Both Weakness of Arms/Hands: Both  Permission Sought/Granted  Emotional Assessment Appearance:: Appears stated age   Affect (typically observed): Blunt, Calm Orientation: : Oriented to Self, Oriented to Place, Oriented to  Time, Oriented to Situation Alcohol / Substance Use: Not Applicable Psych Involvement: No  (comment)  Admission diagnosis:  Fall [W19.XXXA] Bilateral cellulitis of lower leg [L03.116, L03.115] Bilateral lower leg cellulitis [L03.116, Y86.578] Patient Active Problem List   Diagnosis Date Noted  . Bilateral lower leg cellulitis 08/18/2020  . Rhabdomyolysis 08/18/2020  . Fall at home, initial encounter 08/18/2020  . Fall 08/18/2020  . Primary osteoarthritis of left knee 11/26/2019  . Sepsis ([place]) 07/30/2019  . Type 2 diabetes mellitus with diabetic polyneuropathy, with long-term current use of insulin ([place]) 07/03/2019  . OSA on CPAP 01/24/2019  . Lymphedema 09/03/2018  . Venous stasis of both lower extremities 09/03/2018  . Status post lumbar spine surgery for decompression of spinal cord 03/30/2018  . Myofascial pain 03/30/2018  . Spinal stenosis of thoracic region 03/30/2018  . Back pain 11/02/2017  . Iron deficiency anemia 05/10/2017  . Benign essential hypertension 03/10/2017  . Long term prescription benzodiazepine use 01/18/2017  . Vitamin D insufficiency 01/18/2017  . Neurogenic pain 01/17/2017  . L3-4 severe lumbar facet hypertrophy and spinal stenosis 01/10/2017  . Diabetes with retinopathy ([place]) 12/07/2016  . Diabetic nephropathy ([place]) 12/07/2016  . Gastroparesis due to DM ([place]) 12/07/2016  . Long term current use of opiate analgesic 12/07/2016  . Long term prescription opiate use 12/07/2016  . Opiate use 12/07/2016  . Chronic pain syndrome 12/07/2016  . Chronic low back pain (Location of Primary Source of Pain) (Bilateral) (R>L) 12/07/2016  . 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 Chronic lower extremity pain (Location of Secondary source of pain) (Bilateral) (R>L) 12/07/2016  . Chronic knee pain (Location of Tertiary source of pain) (Right) 12/07/2016  . Osteoarthritis of knee (Right) 12/07/2016  . Grade 1 Anterolisthesis of L3 over L4 and L4 over L5 12/07/2016  . Osteoarthritis of sacroiliac joint (Right) 12/07/2016  . Generalized  osteoarthritis of multiple sites 10/22/2016  . Degenerative spondylolisthesis 09/08/2016  . L3-4 severe lumbar spinal stenosis (12/31/2016 MRI) 09/08/2016  . Lung nodule, multiple 08/31/2016  . Hepatic cirrhosis ([place]) 06/20/2016  . Fibromyalgia 04/15/2016  . Seronegative arthritis 04/15/2016  . Diabetes mellitus with peripheral vascular disease ([place]) 03/16/2016  . Type 2 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, with long-term current use of insulin ([place]) 03/16/2016  . Morbid obesity with BMI of 50.0-59.9, adult ([place]) 03/16/2016  . Type 2 diabetes mellitus with diabetic nephropathy, with long-term current use of insulin ([place]) 03/16/2016  . Microalbuminuria 02/20/2016  . Essential hypertension 01/27/2016  . DM (diabetes mellitus) type II uncontrolled, periph vascular disorder ([place]) 04/26/2014  . Hyperlipemia 04/26/2014  . OSA (obstructive sleep apnea) 04/26/2014  . Abnormal tumor markers 12/11/2012  . Chronic kidney disease, stage II (mild) 12/11/2012  . Unilateral small kidney 12/11/2012   PCP:  Rusty Aus, MD Pharmacy:   [id number] [place] [id number]: [phone]: [phone]    Social Determinants of Health (SDOH) Interventions    Readmission Risk Interventions No flowsheet data found.

## 2020-08-19 NOTE — Progress Notes (Signed)
Writer called to patient's room. Patient had vomiting x1 Zofran given. Patient's daughter at bedside.

## 2020-08-19 NOTE — Progress Notes (Signed)
Patient's daughter laura at bedside and is taking patient's insulin pump home d/t its empty

## 2020-08-19 NOTE — Plan of Care (Signed)
  Problem: Coping: Goal: Level of anxiety will decrease Outcome: Not Progressing   

## 2020-08-19 NOTE — Progress Notes (Signed)
Writer received in report this morning patient is refusing to have tele placed because patient stated "I will not wear this because the police are tracking me." Writer has notified MD and spoke with patient's daughter about mother's concerns and stated she will be here this morning.

## 2020-08-19 NOTE — Progress Notes (Addendum)
Neurology Progress Note  Patient ID: TIFFNEY HAUGHTON is a 69 y.o. left-handed (forced to use right hand as a child) 69 y.o. 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  She is not interested in an MRI  Exam: Vitals:   08/19/20 0628 08/19/20 0726  BP: (!) 162/74 (!) 194/81  Pulse: 78 80  Resp: 18 19  Temp: 98.7 F (37.1 C) 99.1 F (37.3 C)  SpO2: 95% 92%   Gen: In bed, NAD Psychiatric: Remains somewhat anxious, able to clearly articulate values and thoughts without flight of ideas or pressured speech Resp: non-labored breathing, no acute distress Abd: soft, nt  Neuro: MS: Alert, awake, oriented to person place situation and time.  Able to follow simple commands.  Converses appropriately, with marked improvement in speech compared to yesterday CN: Notable for stable limited abduction of the right eye.  Upgaze is more consistently intact today, and the remainder of her EOM are intact.  Today she easily states that sensation is symmetric on both sides of  her face, her face is symmetric, her uvula remains difficult to fully visualize but appears grossly symmetric, and her tongue is midline.  She has equal strength of sternocleidomastoid 5 out of 5 left and right Motor: She has mild symmetric pronation of both her upper extremities without drift.  Tone is normal.  She more briskly moves her bilateral lower extremities today, at least 4+ out of 5 in hip flexion bilaterally that she continues to complain of pain Sensory: Intact to light touch in all 4 extremities   Pertinent Labs: LDL is elevated at 114, ESR is elevated at 45, hemoglobin A1c is 7.3, HIV is nonreactive, TSH is low normal at 0.752 RPR remains in process Echo has been completed but read is not yet back   Impression: Ms. Cookie Pore is a 69 year old woman for whom we are consulted about an isolated cranial nerve VI palsy (right).  This is most likely due to her chronic microvascular disease from chronic hypoxia, hypertension, hyperlipidemia, diabetes.  There is room for improvement of her A1c as well as her cholesterol.  Given her history of melanoma, would prefer MRI brain to exclude metastatic lesions especially given her inconsistent history of possible word finding difficulties and left hand weakness as well.  However the patient is able to express her understanding of the differential diagnosis and is elected to defer an MRI at this time.  Recommendations: -Should TTE demonstrate a left ventricular thrombus, patient should be started on anticoagulation -30-day event monitor at discharge to assess for occult atrial fibrillation -No further inpatient work-up recommendations -Please do not hesitate to  reach out to neurology if there are any new neurological complaints, I will sign off at this time  [place] [phone]

## 2020-08-19 NOTE — Progress Notes (Signed)
PROGRESS NOTE    Brittney [name]    Code Status: Full Code  BDZ:[id number] DOB: 1951/02/18 DOA: 08/17/2020 LOS: 1 days  PCP: Rusty Aus, MD CC:  Chief Complaint  Patient presents with   Weakness       Hospital Summary   Brittney [name] is a 69 y.o. female with a history of morbid obesity, diabetes, hypothyroidism, hypertension, venous stasis, COPD, OSA on CPAP, CAD, chronic back pain with spinal cord stimulator, chronic hypoxia on O2 at rest, below s/p left TKA who presented to the ED from home after being on her floor next to her bed for 2 days and does not recall a fall or syncopal event anything leading up to this event who was admitted for bilateral lower extremity cellulitis and initially started on ceftriaxone.   Significant recent history leading up to hospitalization: Patient reports that she was in her usual state of health until she underwent an endoscopy on 7/23 for variceal screening at which point recalls having double vision ever since she woke up from anesthesia.  She was seen by her physician who reported a 6th nerve palsy on the right.  She had a CTA head on 7/27 which showed a small chronic cerebellar infarct as well as intracranial atherosclerosis including a moderate to severe stenosis at the left MCA bifurcation to moderate left P1 and Severe left P2 stenosis.  Since that time the patient has had worsening fatigue and at times has noted speech changes and word finding difficulty.  Even noted thatthe phone kept slipping from her hand while on the phone last week.  Neurology consulted to assist in stroke/syncope workup. Ceftriaxone switched to Ancef   A & P   Principal Problem:   Bilateral lower leg cellulitis Active Problems:   Hepatic cirrhosis (HCC)   OSA (obstructive sleep apnea)   Essential hypertension   Long term current use of opiate analgesic   Morbid obesity with BMI of 50.0-59.9, adult (HCC)   Venous stasis of both lower extremities    Rhabdomyolysis   Type 2 diabetes mellitus with diabetic polyneuropathy, with long-term current use of insulin (HCC)   OSA on CPAP   Fall at home, initial encounter   Fall   1. S/P fall known etiology concerning for syncopal episode +/- stroke vs. Polypharmacy  a. Echo still pending b. CTA head/neck: Moderate to marked stenosis at an early left MCA bifurcation. Up to marked stenosis of the left P2 PCA. c. Patient refusing MRI brain due to history of spinal cord stimulator which may be contraindicated d. Tele e. Neurology consulted for neurologic work up f. PT eval  2. Mild rhabdomyolysis secondary to fall and laying on the floor x 2 days a. CK 597 on admission, follow up CK after IV fluids  3.  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 Occurred post recent endoscopic procedure concerning for stroke during anesthesia? b. Neurology consulted c. Patient refusing to go for MRI brain  d. Appreciate neuro recommendations  4. Cerebrovascular disease   Remote Left Cerebellar Infarct a. Lipid panel with LDL 113 b. HA1c: 7.3 c. Patient states she tolerates crestor and is on 20 mg at home. Will increase to 40 mg d. Neuro on board  5. Bilateral lower extremity nonpurulent cellulitis improving a. Afebrile, leukocytosis on presentation now resolved b. Continue Ancef  6. Fatigue, likely multifactorial: polypharmacy, debility, concern for underlying neurologic issues a. TSH unremarkable b. Hold sedating medications as able  7. Chronic pain  a. Has a spinal cord stimulator b. Continue home percocet c. Continue gabapentin  8. Diabetes a. Has an insulin pump but apparently it is empty which she used initially. She also had some confusion this AM. b. Hold on insulin pump use c. Diabetic Coordinator: Lantus 35 units Q12H, CBGs Q4H, Novolog 0-20 units Q4H.  9. Constipation a. Continue linzess  10. Hypertensive Urgency resolved a. Continue home coreg and hydralazine, holding  home ARB b. Added back home Clonidine and given a dose of hydralazine today  11. Chronic hypoxemic respiratory failure with moderate persistent asthma a. Follows with pulm b. Continue O2  12. OSA a. cpap  13. Chronic lymphedema a. Continue home lasix and metolazone  14. Depression a. Continue home med  15. Debility a. PT  16. Right Eye viral conjunctivitis a. Eye drops  17. Anxiety a. Continue home xanax  18. Hypokalemia   Hypomagnesemia a. Replete  19. Confusion resolved a. Brief episode of confusion stating "I will not wear this because the police are tracking me." in terms of tele b. Likely from waking up in a new place with a brief episode of confusion/paranoia     DVT prophylaxis: enoxaparin (LOVENOX) injection 40 mg Start: 08/18/20 1000   Family Communication: Patient's daughter at bedside has been updated   Disposition Plan:  Status is: Inpatient  Remains inpatient appropriate because:IV treatments appropriate due to intensity of illness or inability to take PO and Inpatient level of care appropriate due to severity of illness   Dispo: The patient is from: Home              Anticipated d/c is to: SNF              Anticipated d/c date is: 2 days              Patient currently is not medically stable to d/c.          Pressure injury documentation    None  Consultants  Neuro  Procedures  None  Antibiotics   Anti-infectives (From admission, onward)   Start     Dose/Rate Route Frequency Ordered Stop   08/18/20 2100  cefTRIAXone (ROCEPHIN) 1 g in sodium chloride 0.9 % 100 mL IVPB  Status:  Discontinued        1 g 200 mL/hr over 30 Minutes Intravenous Every 24 hours 08/18/20 0237 08/18/20 1344   08/18/20 1500  ceFAZolin (ANCEF) IVPB 2g/100 mL premix        2 g 200 mL/hr over 30 Minutes Intravenous Every 8 hours 08/18/20 1344     08/18/20 0000  cefTRIAXone (ROCEPHIN) 1 g in sodium chloride 0.9 % 100 mL IVPB        1 g 200 mL/hr over  30 Minutes Intravenous  Once 08/17/20 2356 08/18/20 0202        Subjective   This am the patient the patient was refusing telemetry and said "I will not wear this because the police are tracking me." Once I presented to the bedside, this confusion seemed to have resolved. Daughter at bedside  Patient states improved lower extremity discomfort. She continues to have diplopia. No chest pain or shortness of breath. No other issues or complaints or overnight events.   Objective   Vitals:   08/19/20 0215 08/19/20 0628 08/19/20 0726 08/19/20 1221  BP: (!) 170/71 (!) 162/74 (!) 194/81 (!) 147/58  Pulse: 71 78 80 79  Resp: 16 18 19 17   Temp: 98.7 F (37.1  C) 98.7 F (37.1 C) 99.1 F (37.3 C)   TempSrc: Oral Oral Oral   SpO2: 97% 95% 92% 92%  Weight:      Height:        Intake/Output Summary (Last 24 hours) at 08/19/2020 1437 Last data filed at 08/19/2020 6144 Gross per 24 hour  Intake 120 ml  Output 4100 ml  Net -3980 ml   Filed Weights   08/18/20 0110  Weight: (!) 145 kg    Examination:  Physical Exam Vitals and nursing note reviewed.  Constitutional:      Appearance: She is obese.  HENT:     Head: Normocephalic and atraumatic.  Eyes:     Conjunctiva/sclera: Conjunctivae normal.  Cardiovascular:     Rate and Rhythm: Normal rate and regular rhythm.  Pulmonary:     Effort: Pulmonary effort is normal.     Breath sounds: Normal breath sounds.  Abdominal:     General: Abdomen is flat.     Palpations: Abdomen is soft.  Musculoskeletal:        General: No swelling or tenderness.  Skin:    Findings: Rash present.     Comments: Rash improved  Neurological:     Mental Status: She is alert.     Motor: No weakness.     Comments: Right eye with inability to look outward  Psychiatric:        Mood and Affect: Mood normal.        Behavior: Behavior normal.     Data Reviewed: I have personally reviewed following labs and imaging studies  CBC: Recent Labs  Lab  08/17/20 2351 08/18/20 1055 08/19/20 0621  WBC 10.7* 8.2 7.6  NEUTROABS 8.1*  --   --   HGB 13.2 11.3* 12.5  HCT 40.8 35.7* 37.0  MCV 88.1 89.9 86.0  PLT 192 177 315   Basic Metabolic Panel: Recent Labs  Lab 08/17/20 2351 08/18/20 1055 08/19/20 0621  NA 139 142 140  K 3.6 3.5 3.0*  CL 96* 100 94*  CO2 33* 31 33*  GLUCOSE 103* 160* 163*  BUN 16 14 10   CREATININE 0.84 0.66 0.73  CALCIUM 10.1 9.1 10.2  MG  --   --  1.7   GFR: Estimated Creatinine Clearance: 100.8 mL/min (by C-G formula based on SCr of 0.73 mg/dL). Liver Function Tests: Recent Labs  Lab 08/17/20 2351  AST 55*  ALT 23  ALKPHOS 96  BILITOT 0.7  PROT 7.4  ALBUMIN 3.5   No results for input(s): LIPASE, AMYLASE in the last 168 hours. No results for input(s): AMMONIA in the last 168 hours. Coagulation Profile: Recent Labs  Lab 08/18/20 0000  INR 0.9   Cardiac Enzymes: Recent Labs  Lab 08/17/20 2351  CKTOTAL 597*   BNP (last 3 results) No results for input(s): PROBNP in the last 8760 hours. HbA1C: Recent Labs    08/18/20 0620  HGBA1C 7.3*   CBG: Recent Labs  Lab 08/18/20 1242 08/18/20 1653 08/18/20 2231 08/19/20 0725 08/19/20 1145  GLUCAP 175* 210* 191* 184* 214*   Lipid Profile: Recent Labs    08/18/20 1059  CHOL 196  HDL 59  LDLCALC 114*  TRIG 113  CHOLHDL 3.3   Thyroid Function Tests: Recent Labs    08/17/20 2251  TSH 0.752   Anemia Panel: No results for input(s): VITAMINB12, FOLATE, FERRITIN, TIBC, IRON, RETICCTPCT in the last 72 hours. Sepsis Labs: Recent Labs  Lab 08/17/20 2351  LATICACIDVEN 1.5    Recent  Results (from the past 240 hour(s))  SARS Coronavirus 2 by RT PCR (hospital order, performed in [facility] hospital lab) Nasopharyngeal Nasopharyngeal Swab     Status: None   Collection Time: 08/18/20 12:00 AM   Specimen: Nasopharyngeal Swab  Result Value Ref Range Status   SARS Coronavirus 2 NEGATIVE NEGATIVE Final    Comment: (NOTE) SARS-CoV-2  target nucleic acids are NOT DETECTED.  The SARS-CoV-2 RNA is generally detectable in upper and lower respiratory specimens during the acute phase of infection. The lowest concentration of SARS-CoV-2 viral copies this assay can detect is 250 copies / mL. A negative result does not preclude SARS-CoV-2 infection and should not be used as the sole basis for treatment or other patient management decisions.  A negative result may occur with improper specimen collection / handling, submission of specimen other than nasopharyngeal swab, presence of viral mutation(s) within the areas targeted by this assay, and inadequate number of viral copies (<250 copies / mL). A negative result must be combined with clinical observations, patient history, and epidemiological information.  Fact Sheet for Patients:   StrictlyIdeas.no  Fact Sheet for Healthcare Providers: BankingDealers.co.za  This test is not yet approved or  cleared by the [place] FDA and has been authorized for detection and/or diagnosis of SARS-CoV-2 by FDA under an Emergency Use Authorization (EUA).  This EUA will remain in effect (meaning this test can be used) for the duration of the COVID-19 declaration under Section 564(b)(1) of the Act, 21 U.S.C. section 360bbb-3(b)(1), unless the authorization is terminated or revoked sooner.  Performed at [facility], [address] [place], [place] [address]   Urine culture     Status: Abnormal   Collection Time: 08/18/20 12:11 AM   Specimen: In/Out Cath Urine  Result Value Ref Range Status   Specimen Description   Final    IN/OUT CATH URINE Performed at [facility], [address], [place], [place] [address]    Special Requests   Final    NONE Performed at [facility], [address] [place], [place] [address]    Culture MULTIPLE SPECIES PRESENT, SUGGEST RECOLLECTION (A)  Final   Report Status  08/18/2020 FINAL  Final  Blood culture (routine single)     Status: None (Preliminary result)   Collection Time: 08/18/20 12:35 AM   Specimen: BLOOD  Result Value Ref Range Status   Specimen Description BLOOD LEFT ANTECUBITAL  Final   Special Requests   Final    BOTTLES DRAWN AEROBIC ONLY Blood Culture adequate volume   Culture   Final    NO GROWTH 1 DAY Performed at [facility], [address], [place], [place] [address]    Report Status PENDING  Incomplete         Radiology Studies: CT Head Wo Contrast  Result Date: 08/18/2020 CLINICAL DATA:  Syncope EXAM: CT HEAD WITHOUT CONTRAST TECHNIQUE: Contiguous axial images were obtained from the base of the skull through the vertex without intravenous contrast. COMPARISON:  None. FINDINGS: Brain: There is no mass, hemorrhage or extra-axial collection. The size and configuration of the ventricles and extra-axial CSF spaces are normal. The brain parenchyma is normal, without acute infarction. 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 Vascular: No abnormal hyperdensity of the major intracranial arteries or dural venous sinuses. No intracranial atherosclerosis. Skull: The visualized skull base, calvarium and extracranial soft tissues are normal. Sinuses/Orbits: No fluid levels or advanced mucosal  thickening of the visualized paranasal sinuses. No mastoid or middle ear effusion. The orbits are normal. IMPRESSION: 1. No acute intracranial abnormality. 2. 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 3. Unchanged left cerebellar infarct. Electronically Signed   By: Ulyses Jarred M.D.   On: 08/18/2020 01:18   DG Chest Port 1 View  Result Date: 08/18/2020 CLINICAL DATA:  Sepsis EXAM: PORTABLE CHEST 1 VIEW COMPARISON:  07/30/2019 FINDINGS: The heart size and mediastinal contours are within normal limits. Both lungs are clear. The  visualized skeletal structures are unremarkable. IMPRESSION: No active disease. Electronically Signed   By: Ulyses Jarred M.D.   On: 08/18/2020 00:44   CT ANGIO HEAD CODE STROKE  Result Date: 08/18/2020 CLINICAL DATA:  Syncope, left neck pain EXAM: CT ANGIOGRAPHY HEAD AND NECK TECHNIQUE: Multidetector CT imaging of the head and neck was performed using the standard protocol during bolus administration of intravenous contrast. Multiplanar CT image reconstructions and MIPs were obtained to evaluate the vascular anatomy. Carotid stenosis measurements (when applicable) are obtained utilizing NASCET criteria, using the distal internal carotid diameter as the denominator. CONTRAST:  179mL OMNIPAQUE IOHEXOL 350 MG/ML SOLN COMPARISON:  CTA head 07/22/2020, CT head earlier same day FINDINGS: CT HEAD Brain: There is no acute intracranial hemorrhage, mass effect, or edema. Gray-white differentiation is preserved. There is no extra-axial fluid collection. Chronic left cerebellar infarct. Ventricles and sulci are stable in size and configuration. Vascular: There is atherosclerotic calcification at the skull base. Skull: Calvarium is unremarkable. Sinuses/Orbits: No acute finding. Other: None. Review of the MIP images confirms the above findings CTA NECK Aortic arch: Great vessel origins are patent. Right carotid system: Patent. Plaque at the ICA origin causes minimal stenosis. Partial retropharyngeal course Left carotid system: Patent. No measurable stenosis at the ICA origin. Partial retropharyngeal course. Vertebral arteries: Patent. Origins are partially obscured by artifact. No measurable stenosis or evidence of dissection. Skeleton: Prior anterior fusion at C5-C6. Degenerative changes of the cervical spine. Other neck: No mass or adenopathy. Upper chest: No apical lung mass. Review of the MIP images confirms the above findings CTA HEAD Anterior circulation: Intracranial internal carotid arteries are patent with mild  calcified plaque. Anterior and middle cerebral arteries are patent. 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 Posterior circulation: Intracranial vertebral arteries, basilar artery, and posterior cerebral arteries are patent. There is fetal or near fetal origin of the right posterior cerebral artery. Left P2 PCA atherosclerotic irregularity and up to marked stenosis. Venous sinuses: Patent as allowed by contrast bolus timing. Review of the MIP images confirms the above findings IMPRESSION: No acute intracranial abnormality. No large vessel occlusion, hemodynamically significant stenosis, or evidence of dissection in the neck. No proximal intracranial vessel occlusion. 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 Up to marked stenosis of the left P2 PCA. Electronically Signed   By: Macy Mis M.D.   On: 08/18/2020 16:02   CT ANGIO NECK CODE STROKE  Result Date: 08/18/2020 CLINICAL DATA:  Syncope, left neck pain EXAM: CT ANGIOGRAPHY HEAD AND NECK TECHNIQUE: Multidetector CT imaging of the head and neck was performed using the standard protocol during bolus administration of intravenous contrast. Multiplanar CT image reconstructions and MIPs were obtained to evaluate the vascular anatomy. Carotid stenosis measurements (when applicable) are obtained utilizing NASCET criteria, using the distal internal carotid diameter as the denominator. CONTRAST:  110mL OMNIPAQUE IOHEXOL 350 MG/ML SOLN COMPARISON:  CTA head 07/22/2020, CT head earlier same day  FINDINGS: CT HEAD Brain: There is no acute intracranial hemorrhage, mass effect, or edema. Gray-white differentiation is preserved. There is no extra-axial fluid collection. Chronic left cerebellar infarct. Ventricles and sulci are stable in size and configuration. Vascular: There is atherosclerotic calcification at the skull base. Skull: Calvarium is unremarkable. Sinuses/Orbits: No acute finding. Other: None. Review of the MIP images  confirms the above findings CTA NECK Aortic arch: Great vessel origins are patent. Right carotid system: Patent. Plaque at the ICA origin causes minimal stenosis. Partial retropharyngeal course Left carotid system: Patent. No measurable stenosis at the ICA origin. Partial retropharyngeal course. Vertebral arteries: Patent. Origins are partially obscured by artifact. No measurable stenosis or evidence of dissection. Skeleton: Prior anterior fusion at C5-C6. Degenerative changes of the cervical spine. Other neck: No mass or adenopathy. Upper chest: No apical lung mass. Review of the MIP images confirms the above findings CTA HEAD Anterior circulation: Intracranial internal carotid arteries are patent with mild calcified plaque. Anterior and middle cerebral arteries are patent. 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 Posterior circulation: Intracranial vertebral arteries, basilar artery, and posterior cerebral arteries are patent. There is fetal or near fetal origin of the right posterior cerebral artery. Left P2 PCA atherosclerotic irregularity and up to marked stenosis. Venous sinuses: Patent as allowed by contrast bolus timing. Review of the MIP images confirms the above findings IMPRESSION: No acute intracranial abnormality. No large vessel occlusion, hemodynamically significant stenosis, or evidence of dissection in the neck. No proximal intracranial vessel occlusion. 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 Up to marked stenosis of the left P2 PCA. Electronically Signed   By: Macy Mis M.D.   On: 08/18/2020 16:02        Scheduled Meds:  budesonide  2 mL Nebulization Daily   carvedilol  50 mg Oral BID WC   cloNIDine  0.3 mg Transdermal Weekly   enoxaparin (LOVENOX) injection  40 mg Subcutaneous Q12H   escitalopram  10 mg Oral Daily   furosemide  40 mg Oral Daily   hydrALAZINE  50 mg Oral TID   insulin aspart  0-20 Units Subcutaneous TID WC    insulin glargine  35 Units Subcutaneous QHS   irbesartan  150 mg Oral Daily   levothyroxine  200 mcg Oral QAC breakfast   linaclotide  290 mcg Oral QAC breakfast   metolazone  5 mg Oral Once per day on Mon Thu   pantoprazole  40 mg Oral BID   potassium chloride SA  10 mEq Oral Daily   Continuous Infusions:  sodium chloride 125 mL/hr at 08/19/20 1411    ceFAZolin (ANCEF) IV 2 g (08/19/20 1412)     Time spent: 45 minutes with over 50% of the time coordinating the patient's care    Harold Hedge, DO [facility] Pager [phone]  Call night coverage person covering after 7pm

## 2020-08-19 NOTE — Progress Notes (Addendum)
Inpatient Diabetes Program Recommendations  AACE/ADA: New Consensus Statement on Inpatient Glycemic Control   Target Ranges:  Prepandial:   less than 140 mg/dL      Peak postprandial:   less than 180 mg/dL (1-2 hours)      Critically ill patients:  140 - 180 mg/dL   Results for ISADORE, BOKHARI "PAM" (MRN [id number]) as of 08/19/2020 12:21  Ref. Range 08/18/2020 12:42 08/18/2020 16:53 08/18/2020 22:31 08/19/2020 07:25 08/19/2020 11:45  Glucose-Capillary Latest Ref Range: 70 - 99 mg/dL 175 (H) 210 (H) 191 (H) 184 (H) 214 (H)   Review of Glycemic Control  Diabetes history: DM2 Outpatient Diabetes medications: Medtronic insulin pump with Humulin R U500  Current orders for Inpatient glycemic control: Novolog 0-15 units TID with meals  Inpatient Diabetes Program Recommendations:    Insulin: Please consider ordering Lantus 35 units Q12H, CBGs Q4H, Novolog 0-20 units Q4H.  Spoke with patient regarding diabetes and home regimen for diabetes management. Patient able to answer questions appropriately but noted to have a short delay from time question asked to when she responded (as if she had to process the question and her response).  Patient states that she is followed by Dr. Gabriel Carina for diabetes management. Per chart, last office visit with Dr. Gabriel Carina was on 05/14/2020.  Patient uses a Medtronic insulin pump with Humulin R U500 insulin as an outpatient. Per chart, patient's insulin pump was removed this morning due to it being empty (running out of insulin).  Per chart, patient's daughter was to take insulin pump home. However, patient states that her daughter forgot to take the insulin pump and it was on the counter along with patient's personal belongings. Asked patient if I could review her pump settings and given permission to do so.   Current insulin pump settings are as follows:  Basal insulin  12A  1.35 units/hour 6:30A  1.45 units/hour 11A  1.75 units/hour 10:30P  1.35 units/hour Total daily  basal insulin: 37.45 units/24 hours of Humulin R U500  Carb Coverage 1:5 1 unit for every 5 grams of carbohydrates  Insulin Sensitivity 1:19 1 unit drops blood glucose 19 mg/dl  Target Glucose Goals 12A 101-150 mg/dl  Active insulin time: 5 hours  In talking with the patient she states that her blood glucose normally runs low 100's-mid 200's mg/dl.  Last A1C in Care Everywhere was 6.8% indicating an average glucose of 148 mg/dl. Informed patient that since insulin pump has been removed due to running out of insulin, will ask provider to order Lantus and Novolog insulin at this time. Patient agreeable to using SQ insulin and verbalized understanding of information discussed and states that she does not have any further questions related to diabetes at this time.  Thanks, Barnie Alderman, RN, MSN, CDE Diabetes Coordinator Inpatient Diabetes Program [phone] (Team Pager from 8am to 5pm)

## 2020-08-19 NOTE — Progress Notes (Signed)
This nurse called to [facility] room. Patient vomiting and complaining of nausea. NP notified. Awaiting response.

## 2020-08-20 DIAGNOSIS — I878 Other specified disorders of veins: Secondary | ICD-10-CM

## 2020-08-20 DIAGNOSIS — R296 Repeated falls: Secondary | ICD-10-CM

## 2020-08-20 DIAGNOSIS — I10 Essential (primary) hypertension: Secondary | ICD-10-CM

## 2020-08-20 DIAGNOSIS — F329 Major depressive disorder, single episode, unspecified: Secondary | ICD-10-CM

## 2020-08-20 DIAGNOSIS — Z79891 Long term (current) use of opiate analgesic: Secondary | ICD-10-CM

## 2020-08-20 DIAGNOSIS — F419 Anxiety disorder, unspecified: Secondary | ICD-10-CM

## 2020-08-20 DIAGNOSIS — G4733 Obstructive sleep apnea (adult) (pediatric): Secondary | ICD-10-CM

## 2020-08-20 DIAGNOSIS — R5381 Other malaise: Secondary | ICD-10-CM

## 2020-08-20 LAB — CBC
HCT: 40.1 % (ref 36.0–46.0)
Hemoglobin: 13 g/dL (ref 12.0–15.0)
MCH: 28.3 pg (ref 26.0–34.0)
MCHC: 32.4 g/dL (ref 30.0–36.0)
MCV: 87.4 fL (ref 80.0–100.0)
Platelets: 228 10*3/uL (ref 150–400)
RBC: 4.59 MIL/uL (ref 3.87–5.11)
RDW: 13.8 % (ref 11.5–15.5)
WBC: 10.5 10*3/uL (ref 4.0–10.5)
nRBC: 0 % (ref 0.0–0.2)

## 2020-08-20 LAB — GLUCOSE, CAPILLARY
Glucose-Capillary: 243 mg/dL — ABNORMAL HIGH (ref 70–99)
Glucose-Capillary: 254 mg/dL — ABNORMAL HIGH (ref 70–99)
Glucose-Capillary: 255 mg/dL — ABNORMAL HIGH (ref 70–99)
Glucose-Capillary: 256 mg/dL — ABNORMAL HIGH (ref 70–99)
Glucose-Capillary: 258 mg/dL — ABNORMAL HIGH (ref 70–99)
Glucose-Capillary: 269 mg/dL — ABNORMAL HIGH (ref 70–99)

## 2020-08-20 LAB — BASIC METABOLIC PANEL
Anion gap: 12 (ref 5–15)
BUN: 11 mg/dL (ref 8–23)
CO2: 34 mmol/L — ABNORMAL HIGH (ref 22–32)
Calcium: 10.5 mg/dL — ABNORMAL HIGH (ref 8.9–10.3)
Chloride: 91 mmol/L — ABNORMAL LOW (ref 98–111)
Creatinine, Ser: 0.75 mg/dL (ref 0.44–1.00)
GFR calc Af Amer: >60 mL/min (ref >60)
GFR calc non Af Amer: >60 mL/min (ref >60)
Glucose, Bld: 249 mg/dL — ABNORMAL HIGH (ref 70–99)
Potassium: 3.4 mmol/L — ABNORMAL LOW (ref 3.5–5.1)
Sodium: 137 mmol/L (ref 135–145)

## 2020-08-20 LAB — MAGNESIUM: Magnesium: 1.8 mg/dL (ref 1.7–2.4)

## 2020-08-20 LAB — RPR: RPR Ser Ql: NONREACTIVE

## 2020-08-20 MED ORDER — FUROSEMIDE 40 MG PO TABS: 40.0000 mg | ORAL_TABLET | Freq: Two times a day (BID) | ORAL | Status: DC

## 2020-08-20 MED ORDER — INSULIN GLARGINE 100 UNIT/ML ~~LOC~~ SOLN
30.0000 [IU] | Freq: Two times a day (BID) | SUBCUTANEOUS | Status: DC
  Administered 2020-08-20: 30 [IU] via SUBCUTANEOUS
  Filled 2020-08-20 (×3): qty 0.3

## 2020-08-20 MED ORDER — BUTALBITAL-APAP-CAFFEINE 50-325-40 MG PO TABS
1.0000 | ORAL_TABLET | Freq: Two times a day (BID) | ORAL | Status: DC | PRN
  Filled 2020-08-20: qty 1

## 2020-08-20 MED ORDER — INSULIN ASPART 100 UNIT/ML ~~LOC~~ SOLN
0.0000 [IU] | Freq: Three times a day (TID) | SUBCUTANEOUS | Status: DC
  Administered 2020-08-20: 11 [IU] via SUBCUTANEOUS
  Administered 2020-08-20: 5 [IU] via SUBCUTANEOUS
  Administered 2020-08-21: 7 [IU] via SUBCUTANEOUS
  Filled 2020-08-20 (×4): qty 1

## 2020-08-20 MED ORDER — ALUM & MAG HYDROXIDE-SIMETH 200-200-20 MG/5ML PO SUSP
30.0000 mL | Freq: Four times a day (QID) | ORAL | Status: DC | PRN
  Administered 2020-08-20 – 2020-08-21 (×2): 30 mL via ORAL
  Filled 2020-08-20 (×2): qty 30

## 2020-08-20 MED ORDER — FUROSEMIDE 40 MG PO TABS
40.0000 mg | ORAL_TABLET | Freq: Every day | ORAL | Status: DC
  Filled 2020-08-20: qty 1

## 2020-08-20 MED ORDER — METOCLOPRAMIDE HCL 10 MG PO TABS
5.0000 mg | ORAL_TABLET | Freq: Three times a day (TID) | ORAL | Status: DC | PRN
  Administered 2020-08-20: 5 mg via ORAL
  Filled 2020-08-20: qty 1

## 2020-08-20 MED ORDER — TRAZODONE HCL 100 MG PO TABS
100.0000 mg | ORAL_TABLET | Freq: Every day | ORAL | Status: DC
  Administered 2020-08-20: 100 mg via ORAL
  Filled 2020-08-20: qty 1

## 2020-08-20 MED ORDER — POTASSIUM CHLORIDE CRYS ER 20 MEQ PO TBCR
40.0000 meq | EXTENDED_RELEASE_TABLET | Freq: Once | ORAL | Status: AC
  Administered 2020-08-20: 40 meq via ORAL
  Filled 2020-08-20: qty 2

## 2020-08-20 MED ORDER — INSULIN GLARGINE 100 UNIT/ML ~~LOC~~ SOLN
25.0000 [IU] | Freq: Two times a day (BID) | SUBCUTANEOUS | Status: DC
  Administered 2020-08-20: 25 [IU] via SUBCUTANEOUS
  Filled 2020-08-20 (×3): qty 0.25

## 2020-08-20 MED ORDER — DOXAZOSIN MESYLATE 4 MG PO TABS
4.0000 mg | ORAL_TABLET | Freq: Every evening | ORAL | Status: DC
  Administered 2020-08-20: 4 mg via ORAL
  Filled 2020-08-20 (×2): qty 1

## 2020-08-20 MED ORDER — INSULIN ASPART 100 UNIT/ML ~~LOC~~ SOLN
0.0000 [IU] | Freq: Every day | SUBCUTANEOUS | Status: DC
  Administered 2020-08-20: 3 [IU] via SUBCUTANEOUS
  Filled 2020-08-20: qty 1

## 2020-08-20 MED ORDER — PRAMIPEXOLE DIHYDROCHLORIDE 0.25 MG PO TABS
0.5000 mg | ORAL_TABLET | Freq: Two times a day (BID) | ORAL | Status: DC
  Administered 2020-08-20 – 2020-08-21 (×2): 0.5 mg via ORAL
  Filled 2020-08-20 (×3): qty 2

## 2020-08-20 MED ORDER — HYDRALAZINE HCL 50 MG PO TABS
75.0000 mg | ORAL_TABLET | Freq: Three times a day (TID) | ORAL | Status: DC
  Administered 2020-08-20 – 2020-08-21 (×3): 75 mg via ORAL
  Filled 2020-08-20 (×3): qty 1

## 2020-08-20 MED ORDER — INSULIN ASPART 100 UNIT/ML ~~LOC~~ SOLN
6.0000 [IU] | Freq: Three times a day (TID) | SUBCUTANEOUS | Status: DC
  Administered 2020-08-21: 6 [IU] via SUBCUTANEOUS
  Filled 2020-08-20: qty 1

## 2020-08-20 MED ORDER — GABAPENTIN 600 MG PO TABS
600.0000 mg | ORAL_TABLET | Freq: Every day | ORAL | Status: DC
  Administered 2020-08-20: 600 mg via ORAL
  Filled 2020-08-20 (×2): qty 1

## 2020-08-20 MED ORDER — ZOLPIDEM TARTRATE 5 MG PO TABS
5.0000 mg | ORAL_TABLET | Freq: Every day | ORAL | Status: DC
  Administered 2020-08-20: 5 mg via ORAL
  Filled 2020-08-20: qty 1

## 2020-08-20 MED ORDER — INSULIN ASPART 100 UNIT/ML ~~LOC~~ SOLN
4.0000 [IU] | Freq: Three times a day (TID) | SUBCUTANEOUS | Status: DC
  Administered 2020-08-20 (×2): 4 [IU] via SUBCUTANEOUS
  Filled 2020-08-20 (×2): qty 1

## 2020-08-20 NOTE — Progress Notes (Signed)
Inpatient Diabetes Program Recommendations  AACE/ADA: New Consensus Statement on Inpatient Glycemic Control   Target Ranges:  Prepandial:   less than 140 mg/dL      Peak postprandial:   less than 180 mg/dL (1-2 hours)      Critically ill patients:  140 - 180 mg/dL   Results for LARIAH, FLEER "PAM" (MRN [id number]) as of 08/20/2020 08:18  Ref. Range 08/19/2020 07:25 08/19/2020 11:45 08/19/2020 17:03 08/19/2020 20:26 08/20/2020 00:36 08/20/2020 04:14 08/20/2020 07:47  Glucose-Capillary Latest Ref Range: 70 - 99 mg/dL 184 (H) 214 (H) 263 (H) 261 (H) 258 (H) 255 (H) 243 (H)   Review of Glycemic Control  Diabetes history: DM2 Outpatient Diabetes medications: Medtronic insulin pump with Humulin R U500  Current orders for Inpatient glycemic control: Lantus 35 units QHS, Novolog 0-20 units TID with meals  Inpatient Diabetes Program Recommendations:    Insulin: Please consider adding Lantus 20 units QAM and adding Novolog 0-5 units QHS.  Thanks, Barnie Alderman, RN, MSN, CDE Diabetes Coordinator Inpatient Diabetes Program [phone] (Team Pager from 8am to 5pm)

## 2020-08-20 NOTE — Consult Note (Signed)
CARDIOLOGY CONSULT NOTE               Patient ID: Brittney [name] MRN: [id number] DOB/AGE: Jun 24, 1951 69 y.o.  Admit date: 08/17/2020 Referring Physician Cyndia Skeeters Primary Physician Emily Filbert, MD Primary Cardiologist [facility] Reason for Consultation syncope, cardiac monitoring at discharge  HPI: 69 year old female referred for cardiac monitoring to be placed at discharge in the setting of recent questionable syncope. The patient has a history of type II diabetes, severe obesity, PVD, hypertension, chronic lymphedema, COPD, OSA on CPAP, coronary artery disease, NSTEMI in 2015 without history of cardiac catheterization or coronary stents, chronic hypoxemic respiratory failure on 3L supplemental oxygen, and chronic pain on chronic opiates. The patient underwent an endoscopy on 07/18/2020 for variceal screening at which time the patient reported experiencing double vision after waking up from anesthesia. 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 The patient reports experiencing speech changes and fatigue since that time. The patient presented to [facility] ER via EMS for evaluation after being found on the floor for 2 days, unable to get up. The patient states she was in her usual state of health, was walking into her bedroom, when she suddenly found herself on the ground, unable to get up. She cannot recall if she had preceding lightheadedness, dizziness, or vision changes. She is unable to provide much history of the event. She thinks she may have lost consciousness, but can remember hitting the ground and the associated pain. Her daughter found her 2 days later and called EMS. The patient was noted to have bilateral lower extremity cellulitis and started on IV antibiotics, and treated for rhabdomyolysis with IV fluids. Admission labs notable for CK 597. The patient is on multiple sedating  medications, which could have contributed to her weakness/fall. Chest xray and head CT negative for acute abnormality. ECG revealed sinus rhythm without acute ST-T wave abnormalities with QTc 490 ms with significant artifact. Neurology was consulted due to an isolated cranial nerve VI palsy, which is felt to possibly to be due to microvascular disease with multiple comorbidities. 2D echocardiogram revealed normal left ventricular function with LVEF greater than 55% with moderate LVH, with no evidence of thrombus or significant valvular abnormalities. A request for 30-day event monitor was msde to assess for subclinical atrial fibrillation.  Currently, the patient denies chest pain. Her breathing is at baseline. She has chronic lower extremity edema with history of lymphedema for which she uses a lymph pump at home. She denies palpitations at this time. She states that around the age of 86, she began treatment for tachycardia (unspecified arrhythmia) with beta blockers, but denies a known history of atrial fibrillation.   Lexiscan Myoview 03/02/2017 revealed LVEF 62%, with mild anterior defect without significant ischemia, representing scar versus artifact.   Review of systems complete and found to be negative unless listed above     Past Medical History:  Diagnosis Date  . Abdominal pain, left upper quadrant 12/11/2012  . Arthritis   . Asthma   . Broken leg 2014   fell twice and broke same leg. had a bone stimulator for first break.  . Cancer ([place]) 2007   melanoma, left ear and back of left leg, removed from back  . Chronic kidney disease, stage 2 (mild)   . Chronic lower back pain   . Collagen vascular disease ([place])   . Coronary artery disease   . Diabetes mellitus  without complication ([place])   . Diabetic nephropathy associated with secondary diabetes mellitus ([place])   . [place] 12/11/2012  . Gastroparesis   . Gross hematuria 12/11/2012  . Hepatic cirrhosis ([place])   . History of kidney  stones   . Hyperlipidemia   . Hypertension   . Hypothyroidism   . Hypothyroidism   . IBS (irritable bowel syndrome)   . Kidney stone 12/11/2012  . Lower extremity edema   . Morbid obesity with BMI of 40.0-44.9, adult ([place])   . Myocardial infarction ([place]) 09/2013  . Nausea without vomiting 12/11/2012  . Nephrolithiasis   . Nephrolithiasis 04/26/2014  . Nonproliferative retinopathy due to secondary diabetes ([place])   . Numbness and tingling of right leg   . Peripheral vascular disease ([place])   . Renal colic 95/62/1308  . Renal insufficiency   . Sciatica   . Sciatica 12/11/2012  . Skin cancer 08/2018  . Sleep apnea    sleep study coming in november  . Unilateral small kidney without contralateral hypertrophy     Past Surgical History:  Procedure Laterality Date  . ABDOMINAL HYSTERECTOMY    . BACK SURGERY  04/1999   L-4-5 LAMINECTOMY. metal in lower back and neck  . CESAREAN SECTION  1980  . CHOLECYSTECTOMY    . COLONOSCOPY  05/04/2001  . COLONOSCOPY WITH PROPOFOL N/A 09/20/2016   Procedure: COLONOSCOPY WITH PROPOFOL;  Surgeon: Lollie Sails, MD;  Location: [facility] ENDOSCOPY;  Service: Endoscopy;  Laterality: N/A;  . DILATION AND CURETTAGE OF UTERUS    . ESOPHAGOGASTRODUODENOSCOPY  02/08/2014  . ESOPHAGOGASTRODUODENOSCOPY (EGD) WITH PROPOFOL N/A 09/20/2016   Procedure: ESOPHAGOGASTRODUODENOSCOPY (EGD) WITH PROPOFOL;  Surgeon: Lollie Sails, MD;  Location: [facility] ENDOSCOPY;  Service: Endoscopy;  Laterality: N/A;  . ESOPHAGOGASTRODUODENOSCOPY (EGD) WITH PROPOFOL N/A 05/23/2018   Procedure: ESOPHAGOGASTRODUODENOSCOPY (EGD) WITH PROPOFOL;  Surgeon: Lollie Sails, MD;  Location: [facility] ENDOSCOPY;  Service: Endoscopy;  Laterality: N/A;  . ESOPHAGOGASTRODUODENOSCOPY (EGD) WITH PROPOFOL N/A 07/18/2020   Procedure: ESOPHAGOGASTRODUODENOSCOPY (EGD) WITH PROPOFOL;  Surgeon: Lesly Rubenstein, MD;  Location: [facility] ENDOSCOPY;  Service: Gastroenterology;  Laterality: N/A;  . EYE SURGERY  1986     FOR MELANOMA  . HERNIA REPAIR    . JOINT REPLACEMENT Left 09/24/2013   TOTAL KNEE  . MOHS SURGERY  11/2016   left side of nose  . PULSE GENERATOR IMPLANT N/A 11/02/2017   Procedure: UNILATERAL PULSE GENERATOR IMPLANT;  Surgeon: Meade Maw, MD;  Location: [facility] ORS;  Service: Neurosurgery;  Laterality: N/A;  . TONSILLECTOMY     WITH ADENOIDECTOMY    Medications Prior to Admission  Medication Sig Dispense Refill Last Dose  . ALPRAZolam (XANAX) 0.5 MG tablet Take 0.5 mg by mouth 4 (four) times daily. Take   Past Week at Unknown time  . budesonide (PULMICORT) 0.5 MG/2ML nebulizer solution Take 2 mLs by nebulization daily.   Past Week at Unknown time  . butalbital-acetaminophen-caffeine (FIORICET, ESGIC) 50-325-40 MG tablet Take 1 tablet by mouth 2 (two) times daily as needed for migraine.    prn at prn  . carvedilol (COREG) 25 MG tablet Take 50 mg by mouth 2 (two) times daily with a meal.    Past Week at Unknown time  . cloNIDine (CATAPRES - DOSED IN MG/24 HR) 0.3 mg/24hr patch Place 0.3 mg onto the skin once a week.    Past Week at Unknown time  . cyclobenzaprine (FLEXERIL) 10 MG tablet Take 20 mg by mouth at bedtime.   Past Week at  Unknown time  . doxazosin (CARDURA) 4 MG tablet Take 4 mg by mouth every evening.    Past Week at Unknown time  . escitalopram (LEXAPRO) 10 MG tablet Take 10 mg by mouth daily.    Past Week at Unknown time  . fluticasone (FLONASE) 50 MCG/ACT nasal spray Place 2 sprays into both nostrils as needed.    Past Week at Unknown time  . furosemide (LASIX) 40 MG tablet Take 40 mg by mouth daily.   Past Week at Unknown time  . gabapentin (NEURONTIN) 600 MG tablet Take 3,000 mg by mouth at bedtime. Take 1 tablet (600mg ) by mouth twice daily and take 3 tablets (1800mg ) by mouth at bedtime   Past Week at Unknown time  . hydrALAZINE (APRESOLINE) 50 MG tablet Take 50 mg by mouth 3 (three) times daily.    Past Week at Unknown time  . ibuprofen (ADVIL,MOTRIN) 800 MG tablet  Take 800 mg by mouth 3 (three) times daily as needed for mild pain or moderate pain.    prn at prn  . insulin regular human CONCENTRATED (HUMULIN R) 500 UNIT/ML kwikpen Inject 40-80 Units into the skin See admin instructions. Uses insulin pump   Past Week at Unknown time  . ipratropium-albuterol (DUONEB) 0.5-2.5 (3) MG/3ML SOLN Take 3 mLs by nebulization every 6 (six) hours.   Past Week at Unknown time  . levothyroxine (SYNTHROID) 200 MCG tablet Take 200 mcg by mouth daily before breakfast.   Past Week at Unknown time  . LINZESS 290 MCG CAPS capsule Take 290 mcg by mouth daily.   Past Week at Unknown time  . metoCLOPramide (REGLAN) 10 MG tablet Take 10 mg by mouth 3 (three) times daily.   Past Week at Unknown time  . ondansetron (ZOFRAN) 4 MG tablet Take 4 mg by mouth every 12 (twelve) hours as needed for nausea or vomiting.    Past Week at Unknown time  . oxyCODONE-acetaminophen (PERCOCET) 10-325 MG tablet Take 1 tablet by mouth every 12 (twelve) hours as needed for pain. Must last 30 days. 60 tablet 0 Past Week at Unknown time  . pantoprazole (PROTONIX) 40 MG tablet Take 40 mg by mouth 2 (two) times daily.    Past Week at Unknown time  . potassium chloride (K-DUR) 10 MEQ tablet Take 10 mEq by mouth daily.   Past Week at Unknown time  . pramipexole (MIRAPEX) 0.5 MG tablet Take 0.5 mg by mouth 2 (two) times daily.    Past Week at Unknown time  . telmisartan (MICARDIS) 40 MG tablet Take 40 mg by mouth daily.   Past Week at Unknown time  . traMADol (ULTRAM) 50 MG tablet Take 100 mg by mouth at bedtime as needed.   3 Past Week at Unknown time  . traZODone (DESYREL) 150 MG tablet Take 150 mg by mouth at bedtime.    Past Week at Unknown time  . zolpidem (AMBIEN) 10 MG tablet Take 10 mg by mouth at bedtime.    Past Week at Unknown time  . albuterol (VENTOLIN HFA) 108 (90 Base) MCG/ACT inhaler Inhale 2 puffs into the lungs 3 (three) times daily as needed for wheezing or shortness of breath.    prn at prn  .  Insulin Detemir (LEVEMIR FLEXTOUCH) 100 UNIT/ML Pen Inject 140 Units into the skin 2 (two) times daily.  (Patient not taking: Reported on 08/18/2020)   Not Taking at Unknown time  . Insulin Human (INSULIN PUMP) SOLN Inject into the skin.     Marland Kitchen  metolazone (ZAROXOLYN) 5 MG tablet Take 5 mg by mouth 2 (two) times a week.   prn at prn  . [START ON 08/27/2020] oxyCODONE-acetaminophen (PERCOCET) 10-325 MG tablet Take 1 tablet by mouth every 12 (twelve) hours as needed for pain. Must last 30 days. 60 tablet 0   . rosuvastatin (CRESTOR) 20 MG tablet Take 20 mg by mouth daily. (Patient not taking: Reported on 06/24/2020)      Social History   Socioeconomic History  . Marital status: Single    Spouse name: Not on file  . Number of children: Not on file  . Years of education: Not on file  . Highest education level: Not on file  Occupational History  . Not on file  Tobacco Use  . Smoking status: Never Smoker  . Smokeless tobacco: Never Used  Vaping Use  . Vaping Use: Never used  Substance and Sexual Activity  . Alcohol use: No  . Drug use: Never  . Sexual activity: Not Currently  Other Topics Concern  . Not on file  Social History Narrative  . Not on file   Social Determinants of Health   Financial Resource Strain:   . Difficulty of Paying Living Expenses: Not on file  Food Insecurity:   . Worried About Charity fundraiser in the Last Year: Not on file  . Ran Out of Food in the Last Year: Not on file  Transportation Needs:   . Lack of Transportation (Medical): Not on file  . Lack of Transportation (Non-Medical): Not on file  Physical Activity:   . Days of Exercise per Week: Not on file  . Minutes of Exercise per Session: Not on file  Stress:   . Feeling of Stress : Not on file  Social Connections:   . Frequency of Communication with Friends and Family: Not on file  . Frequency of Social Gatherings with Friends and Family: Not on file  . Attends Religious Services: Not on file  .  Active Member of Clubs or Organizations: Not on file  . Attends Archivist Meetings: Not on file  . Marital Status: Not on file  Intimate Partner Violence:   . Fear of Current or Ex-Partner: Not on file  . Emotionally Abused: Not on file  . Physically Abused: Not on file  . Sexually Abused: Not on file    Family History  Problem Relation Age of Onset  . Heart disease Mother   . Cancer Mother   . Asthma Mother   . Diabetes Father   . Kidney disease Father   . Hypertension Father   . Breast cancer Neg Hx       Review of systems complete and found to be negative unless listed above      PHYSICAL EXAM  General: morbidly obese female lying in bed, in no acute distress HEENT:  Normocephalic and atramatic Neck:  Thick neck, no obvious masses Lungs: Clear bilaterally, normal effort of breathing on supplemental oxygen Heart: HRRR . Normal S1 and S2 without gallops or murmurs.  Abdomen: nondistended Msk:  Gait not assessed. No obvious deformities Extremities: 2-3+ bilateral lower extremity pitting edema with erythema without weeping Neuro: Alert and oriented X 3. Psych:  Good affect, responds appropriately  Labs:   Lab Results  Component Value Date   WBC 10.5 08/20/2020   HGB 13.0 08/20/2020   HCT 40.1 08/20/2020   MCV 87.4 08/20/2020   PLT 228 08/20/2020    Recent Labs  Lab 08/17/20 2351  08/18/20 1055 08/20/20 0448  NA 139   < > 137  K 3.6   < > 3.4*  CL 96*   < > 91*  CO2 33*   < > 34*  BUN 16   < > 11  CREATININE 0.84   < > 0.75  CALCIUM 10.1   < > 10.5*  PROT 7.4  --   --   BILITOT 0.7  --   --   ALKPHOS 96  --   --   ALT 23  --   --   AST 55*  --   --   GLUCOSE 103*   < > 249*   < > = values in this interval not displayed.   Lab Results  Component Value Date   CKTOTAL 195 08/19/2020   CKMB 1.0 10/23/2014   TROPONINI 0.06 (H) 10/23/2014    Lab Results  Component Value Date   CHOL 196 08/18/2020   CHOL 280 (H) 10/23/2014   Lab Results   Component Value Date   HDL 59 08/18/2020   HDL 44 10/23/2014   Lab Results  Component Value Date   LDLCALC 114 (H) 08/18/2020   LDLCALC 185 (H) 10/23/2014   Lab Results  Component Value Date   TRIG 113 08/18/2020   TRIG 257 (H) 10/23/2014   Lab Results  Component Value Date   CHOLHDL 3.3 08/18/2020   No results found for: LDLDIRECT    Radiology: CT ANGIO HEAD W OR WO CONTRAST  Result Date: 07/22/2020 CLINICAL DATA:  Diplopia since a recent endoscopy. EXAM: CT ANGIOGRAPHY HEAD TECHNIQUE: Multidetector CT imaging of the head was performed using the standard protocol during bolus administration of intravenous contrast. Multiplanar CT image reconstructions and MIPs were obtained to evaluate the vascular anatomy. CONTRAST:  29mL OMNIPAQUE IOHEXOL 350 MG/ML SOLN COMPARISON:  Head CT 07/30/2019 and MRA 07/11/2012 FINDINGS: CT HEAD Brain: There is no evidence of an acute infarct, intracranial hemorrhage, mass, midline shift, or extra-axial fluid collection. A small chronic infarct superiorly in the left cerebellar hemisphere is unchanged. There is also an unchanged subcentimeter calcification anteriorly in the right temporal lobe. The ventricles are normal in size. Mild asymmetric enlargement of the left sylvian fissure is unchanged. Vascular: Calcified atherosclerosis at the skull base. Skull: No fracture or suspicious osseous lesion. Sinuses: Paranasal sinuses and mastoid air cells are clear. Orbits: Left cataract extraction. CTA HEAD Anterior circulation: The internal carotid arteries are patent from skull base to carotid termini with mild atherosclerotic plaque being greater on the right where there is a mild stenosis at the level of the anterior genu. ACAs and MCAs are patent without evidence of a proximal branch occlusion. There is an early bifurcation of the left MCA with a moderate to severe stenosis at the bifurcation involving the superior greater than inferior M2 division origins. There is  a mild-to-moderate stenosis of the distal right M1 segment. No aneurysm is identified. Posterior circulation: The visualized distal vertebral arteries are patent to the basilar and codominant. There is mild atherosclerotic irregularity of the right V4 segment without significant stenosis. Patent PICA, AICA, and SCA origins are identified bilaterally. There are large right and small left posterior communicating arteries. The right P1 segment is hypoplastic, and there may be a superimposed severe proximal P1 stenosis. On the left, there are moderate distal P1 and severe mid P2 stenoses. No aneurysm is identified. Venous sinuses: Patent. Anatomic variants: Predominantly fetal origin of the right PCA. IMPRESSION: 1. No evidence of acute  intracranial abnormality. Small chronic cerebellar infarct. 2. Intracranial atherosclerosis including a moderate to severe stenosis at the left MCA bifurcation and moderate left P1 and severe left P2 stenoses. Electronically Signed   By: Logan Bores M.D.   On: 07/22/2020 16:11   CT Head Wo Contrast  Result Date: 08/18/2020 CLINICAL DATA:  Syncope EXAM: CT HEAD WITHOUT CONTRAST TECHNIQUE: Contiguous axial images were obtained from the base of the skull through the vertex without intravenous contrast. COMPARISON:  None. FINDINGS: Brain: There is no mass, hemorrhage or extra-axial collection. The size and configuration of the ventricles and extra-axial CSF spaces are normal. The brain parenchyma is normal, without acute infarction. 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 Vascular: No abnormal hyperdensity of the major intracranial arteries or dural venous sinuses. No intracranial atherosclerosis. Skull: The visualized skull base, calvarium and extracranial soft tissues are normal. Sinuses/Orbits: No fluid levels or advanced mucosal thickening of the visualized paranasal sinuses. No mastoid or middle ear  effusion. The orbits are normal. IMPRESSION: 1. No acute intracranial abnormality. 2. 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 3. Unchanged left cerebellar infarct. Electronically Signed   By: Ulyses Jarred M.D.   On: 08/18/2020 01:18   DG Chest Port 1 View  Result Date: 08/18/2020 CLINICAL DATA:  Sepsis EXAM: PORTABLE CHEST 1 VIEW COMPARISON:  07/30/2019 FINDINGS: The heart size and mediastinal contours are within normal limits. Both lungs are clear. The visualized skeletal structures are unremarkable. IMPRESSION: No active disease. Electronically Signed   By: Ulyses Jarred M.D.   On: 08/18/2020 00:44   ECHOCARDIOGRAM COMPLETE  Result Date: 08/19/2020    ECHOCARDIOGRAM REPORT   Patient Name:   BENNIE SCAFF Date of Exam: 08/18/2020 Medical Rec #:  [id number]        Height:       66.0 in Accession #:    [id number]       Weight:       319.7 lb Date of Birth:  1951-05-29         BSA:          2.441 m Patient Age:    7 years         BP:           135/61 mmHg Patient Gender: F                HR:           72 bpm. Exam Location:  [facility] Procedure: 2D Echo, Color Doppler and Cardiac Doppler Indications:     Syncope 780.2 / R55  History:         Patient has no prior history of Echocardiogram examinations.                  CAD; Risk Factors:Hypertension. MI.  Sonographer:     Alyse Low Roar Referring Phys:  [id number] [name] Hedge Diagnosing Phys: Nelva Bush MD  Sonographer Comments: Technically difficult study due to poor echo windows. IMPRESSIONS  1. Left ventricular ejection fraction, by estimation, is >55%. The left ventricle has normal function. Left ventricular endocardial border not optimally defined to evaluate regional wall motion. There is moderate left ventricular hypertrophy. Left ventricular diastolic parameters are consistent with Grade I diastolic dysfunction (impaired relaxation).  2. Right ventricular systolic function is normal. The right ventricular size is  not well visualized. Tricuspid regurgitation signal is inadequate for assessing PA pressure.  3.  The mitral valve is grossly normal. No evidence of mitral valve regurgitation. No evidence of mitral stenosis.  4. The aortic valve was not well visualized. Aortic valve regurgitation is not visualized. No aortic stenosis is present. FINDINGS  Left Ventricle: Left ventricular ejection fraction, by estimation, is >55%. The left ventricle has normal function. Left ventricular endocardial border not optimally defined to evaluate regional wall motion. The left ventricular internal cavity size was  normal in size. There is moderate left ventricular hypertrophy. Left ventricular diastolic parameters are consistent with Grade I diastolic dysfunction (impaired relaxation). Right Ventricle: The right ventricular size is not well visualized. No increase in right ventricular wall thickness. Right ventricular systolic function is normal. Tricuspid regurgitation signal is inadequate for assessing PA pressure. Left Atrium: Left atrial size was normal in size. Right Atrium: Right atrial size was normal in size. Pericardium: The pericardium was not well visualized. Presence of pericardial fat pad. Mitral Valve: The mitral valve is grossly normal. No evidence of mitral valve regurgitation. No evidence of mitral valve stenosis. Tricuspid Valve: The tricuspid valve is not well visualized. Tricuspid valve regurgitation is not demonstrated. Aortic Valve: The aortic valve was not well visualized. Aortic valve regurgitation is not visualized. No aortic stenosis is present. Aortic valve peak gradient measures 12.7 mmHg. Pulmonic Valve: The pulmonic valve was not well visualized. Pulmonic valve regurgitation is not visualized. No evidence of pulmonic stenosis. Aorta: The aortic root was not well visualized. Pulmonary Artery: The pulmonary artery is not well seen. Venous: The inferior vena cava was not well visualized. IAS/Shunts: The interatrial  septum was not well visualized.  LEFT VENTRICLE PLAX 2D LVIDd:         5.05 cm  Diastology LVIDs:         3.50 cm  LV e' lateral:   8.92 cm/s LV PW:         1.22 cm  LV E/e' lateral: 9.1 LV IVS:        1.34 cm  LV e' medial:    7.62 cm/s LVOT diam:     1.90 cm  LV E/e' medial:  10.7 LVOT Area:     2.84 cm  RIGHT VENTRICLE RV Mid diam:    3.31 cm RV S prime:     14.70 cm/s LEFT ATRIUM             Index       RIGHT ATRIUM           Index LA diam:        4.00 cm 1.64 cm/m  RA Area:     17.30 cm LA Vol (A2C):   57.1 ml 23.39 ml/m RA Volume:   45.50 ml  18.64 ml/m LA Vol (A4C):   43.2 ml 17.70 ml/m LA Biplane Vol: 49.8 ml 20.40 ml/m  AORTIC VALVE                PULMONIC VALVE AV Area (Vmax): 1.83 cm    PV Vmax:        1.28 m/s AV Vmax:        178.00 cm/s PV Peak grad:   6.6 mmHg AV Peak Grad:   12.7 mmHg   RVOT Peak grad: 4 mmHg LVOT Vmax:      115.00 cm/s  MITRAL VALVE MV Area (PHT): 6.12 cm    SHUNTS MV Decel Time: 124 msec    Systemic Diam: 1.90 cm MV E velocity: 81.40 cm/s MV A velocity: 97.30 cm/s MV E/A ratio:  0.84 MV A Prime:    9.7 cm/s Nelva Bush MD Electronically signed by Nelva Bush MD Signature Date/Time: 08/19/2020/3:27:56 PM    Final    CT ANGIO HEAD CODE STROKE  Result Date: 08/18/2020 CLINICAL DATA:  Syncope, left neck pain EXAM: CT ANGIOGRAPHY HEAD AND NECK TECHNIQUE: Multidetector CT imaging of the head and neck was performed using the standard protocol during bolus administration of intravenous contrast. Multiplanar CT image reconstructions and MIPs were obtained to evaluate the vascular anatomy. Carotid stenosis measurements (when applicable) are obtained utilizing NASCET criteria, using the distal internal carotid diameter as the denominator. CONTRAST:  122mL OMNIPAQUE IOHEXOL 350 MG/ML SOLN COMPARISON:  CTA head 07/22/2020, CT head earlier same day FINDINGS: CT HEAD Brain: There is no acute intracranial hemorrhage, mass effect, or edema. Gray-white differentiation is  preserved. There is no extra-axial fluid collection. Chronic left cerebellar infarct. Ventricles and sulci are stable in size and configuration. Vascular: There is atherosclerotic calcification at the skull base. Skull: Calvarium is unremarkable. Sinuses/Orbits: No acute finding. Other: None. Review of the MIP images confirms the above findings CTA NECK Aortic arch: Great vessel origins are patent. Right carotid system: Patent. Plaque at the ICA origin causes minimal stenosis. Partial retropharyngeal course Left carotid system: Patent. No measurable stenosis at the ICA origin. Partial retropharyngeal course. Vertebral arteries: Patent. Origins are partially obscured by artifact. No measurable stenosis or evidence of dissection. Skeleton: Prior anterior fusion at C5-C6. Degenerative changes of the cervical spine. Other neck: No mass or adenopathy. Upper chest: No apical lung mass. Review of the MIP images confirms the above findings CTA HEAD Anterior circulation: Intracranial internal carotid arteries are patent with mild calcified plaque. Anterior and middle cerebral arteries are patent. 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 Posterior circulation: Intracranial vertebral arteries, basilar artery, and posterior cerebral arteries are patent. There is fetal or near fetal origin of the right posterior cerebral artery. Left P2 PCA atherosclerotic irregularity and up to marked stenosis. Venous sinuses: Patent as allowed by contrast bolus timing. Review of the MIP images confirms the above findings IMPRESSION: No acute intracranial abnormality. No large vessel occlusion, hemodynamically significant stenosis, or evidence of dissection in the neck. No proximal intracranial vessel occlusion. 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 Up to marked stenosis of the left P2 PCA. Electronically Signed   By: Macy Mis M.D.   On: 08/18/2020 16:02   CT ANGIO NECK CODE STROKE  Result  Date: 08/18/2020 CLINICAL DATA:  Syncope, left neck pain EXAM: CT ANGIOGRAPHY HEAD AND NECK TECHNIQUE: Multidetector CT imaging of the head and neck was performed using the standard protocol during bolus administration of intravenous contrast. Multiplanar CT image reconstructions and MIPs were obtained to evaluate the vascular anatomy. Carotid stenosis measurements (when applicable) are obtained utilizing NASCET criteria, using the distal internal carotid diameter as the denominator. CONTRAST:  136mL OMNIPAQUE IOHEXOL 350 MG/ML SOLN COMPARISON:  CTA head 07/22/2020, CT head earlier same day FINDINGS: CT HEAD Brain: There is no acute intracranial hemorrhage, mass effect, or edema. Gray-white differentiation is preserved. There is no extra-axial fluid collection. Chronic left cerebellar infarct. Ventricles and sulci are stable in size and configuration. Vascular: There is atherosclerotic calcification at the skull base. Skull: Calvarium is unremarkable. Sinuses/Orbits: No acute finding. Other: None. Review of the MIP images confirms the above findings CTA NECK Aortic arch: Great vessel origins are patent. Right carotid system: Patent. Plaque at the ICA origin causes minimal stenosis. Partial retropharyngeal  course Left carotid system: Patent. No measurable stenosis at the ICA origin. Partial retropharyngeal course. Vertebral arteries: Patent. Origins are partially obscured by artifact. No measurable stenosis or evidence of dissection. Skeleton: Prior anterior fusion at C5-C6. Degenerative changes of the cervical spine. Other neck: No mass or adenopathy. Upper chest: No apical lung mass. Review of the MIP images confirms the above findings CTA HEAD Anterior circulation: Intracranial internal carotid arteries are patent with mild calcified plaque. Anterior and middle cerebral arteries are patent. 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 Posterior circulation: Intracranial vertebral arteries, basilar  artery, and posterior cerebral arteries are patent. There is fetal or near fetal origin of the right posterior cerebral artery. Left P2 PCA atherosclerotic irregularity and up to marked stenosis. Venous sinuses: Patent as allowed by contrast bolus timing. Review of the MIP images confirms the above findings IMPRESSION: No acute intracranial abnormality. No large vessel occlusion, hemodynamically significant stenosis, or evidence of dissection in the neck. No proximal intracranial vessel occlusion. 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 Up to marked stenosis of the left P2 PCA. Electronically Signed   By: Macy Mis M.D.   On: 08/18/2020 16:02    EKG: sinus rhythm  ASSESSMENT AND PLAN:  1. Possible syncope, unknown if prodromal symptoms occurred, vs. generalized weakness causing fall while on multiple sedating, psychotropic and narcotic medications. Telemetry has been unremarkable. 2D echocardiogram revealed normal LV function without significant valvular abnormalities or thrombus. ECG unremarkable.  2. Coronary artery disease, no recent chest pain, history of NSTEMI in 2015, with no history of cardiac catheterization or stents. Lexiscan Myoview 03/02/2017 revealed LVEF 62%, with mild anterior defect without significant ischemia, representing scar versus artifact.  3. Cerebrovascular disease, remote left cerebellar infarct 4. Type II diabetes on insulin pump 5. Hypertension 6. Obesity, BMI 51 7. Bilateral lower extremity cellulitis, on IV antibiotics 8. Mild rhabdomyolysis, IV fluids  Recommendations: 1. Agree with current therapy 2. 30-day event monitor will be placed tomorrow prior to discharge for evaluation of arrhythmia and pauses 3. Follow-up with Dr. Clayborn Bigness in approximately 6 weeks. Appointment has been requested.  Signed: Clabe Seal PA-C 08/20/2020, 2:44 PM

## 2020-08-20 NOTE — Progress Notes (Signed)
PT Cancellation Note  Patient Details Name: Brittney [name] MRN: [id number] DOB: 15-Mar-1951   Cancelled Treatment:    Reason Eval/Treat Not Completed: Medical issues which prohibited therapy (Chart reviewed; most recent available BP 184/30mmHg outsid eof safe range for exercise. WIll hold PT session at this time, resume services at later date and/or time.)  11:07 AM, 08/20/20 Etta Grandchild, PT, DPT Physical Therapist - [facility]  [phone] ([place])    [place] C 08/20/2020, 11:07 AM

## 2020-08-20 NOTE — Progress Notes (Addendum)
PROGRESS NOTE  Brittney [name] AYT:[id number] DOB: Dec 29, 1950   PCP: Rusty Aus, MD  Patient is from: Home.  DOA: 08/17/2020 LOS: 2  Brief Narrative / Interim history: 69 y.o. female with a history of morbid obesity, diabetes, hypothyroidism, hypertension, venous stasis, COPD, OSA/possible OHS on CPAP, CAD, chronic back pain with spinal cord stimulator, chronic hypoxia on O2 at rest, below s/p left TKA who presented to the ED from home after being on her floor next to her bed for 2 days and does not recall a fall or syncopal event anything leading up to this event who was admitted for bilateral lower extremity cellulitis and initially started on ceftriaxone.    Significant recent history leading up to hospitalization: Patient reports that she was in her usual state of health until she underwent an endoscopy on 7/23 for variceal screening at which point recalls having double vision ever since she woke up from anesthesia.  She was seen by her physician who reported a 6th nerve palsy on the right.  She had a CTA head on 7/27 which showed a small chronic cerebellar infarct as well as intracranial atherosclerosis including a moderate to severe stenosis at the left MCA bifurcation to moderate left P1 and Severe left P2 stenosis.  Since that time the patient has had worsening fatigue and at times has noted speech changes and word finding difficulty.  Even noted thatthe phone kept slipping from her hand while on the phone last week.   Neurology consulted to assist in stroke/syncope workup. Ceftriaxone switched to Ancef.  Therapy recommended SNF but patient is adamant about going home with home health.  Subjective: No major events overnight or this morning.  She reports panic attack yesterday after talking to neurology.  Could not be specific why.  Still with diplopia.  Also reports dry heaving and epigastric burning sensation.  Denies chest pain or shortness of breath.  Denies new focal neuro symptoms.   She refuses SNF.  Objective: Vitals:   08/19/20 2211 08/20/20 0038 08/20/20 0407 08/20/20 0746  BP: (!) 180/87 (!) 183/74 (!) 163/75 (!) 184/80  Pulse: 76 73 72 74  Resp:   14 18  Temp:   99.3 F (37.4 C) 98.9 F (37.2 C)  TempSrc:   Oral Oral  SpO2: 91% (!) 82% 94% 95%  Weight:      Height:        Intake/Output Summary (Last 24 hours) at 08/20/2020 1142 Last data filed at 08/20/2020 0554 Gross per 24 hour  Intake 2200 ml  Output 1800 ml  Net 400 ml   Filed Weights   08/18/20 0110  Weight: (!) 145 kg    Examination:  GENERAL: No apparent distress.  Nontoxic. HEENT: MMM.  Diplopia. EOMI seems intact during exam but strabismus at rest NECK: Supple.  No apparent JVD but difficult due to body habitus.Marland Kitchen  RESP: 94% on RA.  No IWOB.  Fair aeration but limited exam due to body habitus. CVS:  RRR. Heart sounds normal.  ABD/GI/GU: BS+. Abd soft, NTND.  MSK/EXT:  Moves extremities.  Chronic venous insufficiency.  No significant erythema or increased warmth to touch. SKIN: no apparent skin lesion or wound NEURO: Awake, alert and oriented appropriately.  PERRL.  EOMI but noted strabismus at rest.  No facial asymmetry.  Motor 3/5 in RLE.  4/5 elsewhere.  Light sensation intact. PSYCH: Somewhat anxious.  No distress or agitation.  Procedures:  None  Microbiology summarized: COVID-19 PCR negative. Cultures NGTD. Urine cultures  with multiple species.  Assessment & Plan: Unwitnessed fall at home-unclear etiology but could be syncopal, CVA or polypharmacy. Ambulatory dysfunction-walker and wheelchair bound at baseline. 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 Cerebrovascular disease  Remote Left Cerebellar Infarct -CTH without contrast without acute finding.   -CTA head and neck with moderate to marked stenosis at an early left MCA bifurcation and up to marked stenosis and left P2 PCA -MRI was not done due to spinal cord stimulator.  Could not be tested due to remote and  not being charged. -TTE without significant finding.  A1c 7.3%.  LDL 113. -Neurology recommended 30-day event monitor, and signed off.  Will consult cardiology. -Continue Crestor -Therapy recommended SNF but patient refusing.   -Optimize blood pressure and blood glucose control  Mild rhabdomyolysis: Likely traumatic.  CK 597>> 195.  Resolved.   Bilateral lower extremity nonpurulent cellulitis in a patient with chronic venous insufficiency-difficult diagnosis.  Seems to have improved based on my exam. -Continue Ancef while inpatient   Debility/physical deconditioning: Walker and wheelchair dependent at baseline.   -Minimize sedating medications. -Continue therapy  Chronic pain syndrome status post spinal cord stimulator -Currently on Percocet and gabapentin -Bowel regimen  Uncontrolled DM-2 with hyperglycemia: A1c 7.3%. Recent Labs  Lab 08/19/20 2026 08/20/20 0036 08/20/20 0414 08/20/20 0747 08/20/20 1132  GLUCAP 261* 258* 255* 243* 256*  -Change Lantus from 35 units daily to 25 units twice daily -Added NovoLog 4 units AC -Continue SSI-high.  Added nightly coverage -Continue statin  Uncontrolled hypertension/resistant hypertension-maxed on multiple antihypertensive regimens including diuretics. -Increase hydralazine from 50 to 75 mg 3 times daily -Continue Coreg, clonidine, diuretics  Chronic respiratory failure with hypoxia/OSA/possible OHS: Stable. -Continue CPAP and supplemental oxygen as needed -Minimum oxygen to keep saturation above 88%.  Chronic lymphedema/venous insufficiency -On metolazone and Lasix -Continue leg elevation and TED hose  Anxiety and depression-reports panic attack yesterday.  Looks somewhat anxious. -Continue Celexa -Add low-dose BuSpar -Would avoid benzos given risk for respiratory distress  Right eye conjunctivitis?  -Continue eyedrops  Hypokalemia/hypomagnesemia -Replenish and recheck  Altered mental status/confusion-fairly oriented  today.  Multiple risk factors including polypharmacy -Minimize sedating medications -Reorientation and delirium precautions  Constipation- -continue Linzess and other bowel regimens  Orbit obesity Body mass index is 51.6 kg/m. -Could benefit from GLP-1 inhibitors given history of diabetes    Body mass index is 51.6 kg/m.         DVT prophylaxis:  enoxaparin (LOVENOX) injection 40 mg Start: 08/18/20 1000   Code Status: Full code Family Communication: Patient and/or RN. Available if any question.  Status is: Inpatient  Remains inpatient appropriate because:IV treatments appropriate due to intensity of illness or inability to take PO and Inpatient level of care appropriate due to severity of illness   Dispo: The patient is from: Home              Anticipated d/c is to: Home              Anticipated d/c date is: 1 day              Patient currently is not medically stable to d/c.       Consultants:  Neurology-signed off. Cardiology for event monitor   Sch Meds:  Scheduled Meds:  budesonide  2 mL Nebulization Daily   carvedilol  50 mg Oral BID WC   cloNIDine  0.3 mg Transdermal Weekly   enoxaparin (LOVENOX) injection  40 mg Subcutaneous Q12H   escitalopram  10 mg  Oral Daily   furosemide  40 mg Oral Daily   hydrALAZINE  50 mg Oral TID   insulin aspart  0-20 Units Subcutaneous TID WC   insulin aspart  0-5 Units Subcutaneous QHS   insulin aspart  4 Units Subcutaneous TID WC   insulin glargine  25 Units Subcutaneous BID   irbesartan  150 mg Oral Daily   levothyroxine  200 mcg Oral QAC breakfast   linaclotide  290 mcg Oral QAC breakfast   melatonin  2.5 mg Oral QHS   metolazone  5 mg Oral Once per day on Mon Thu   naphazoline-glycerin  1-2 drop Right Eye QID   pantoprazole  40 mg Oral BID   potassium chloride SA  10 mEq Oral Daily   rosuvastatin  40 mg Oral Daily   Continuous Infusions:   ceFAZolin (ANCEF) IV 2 g (08/19/20 1412)   PRN Meds:.acetaminophen  **OR** acetaminophen, ALPRAZolam **AND** ALPRAZolam, bisacodyl, ipratropium-albuterol, morphine injection, ondansetron **OR** ondansetron (ZOFRAN) IV, oxyCODONE-acetaminophen **AND** oxyCODONE  Antimicrobials: Anti-infectives (From admission, onward)    Start     Dose/Rate Route Frequency Ordered Stop   08/18/20 2100  cefTRIAXone (ROCEPHIN) 1 g in sodium chloride 0.9 % 100 mL IVPB  Status:  Discontinued        1 g 200 mL/hr over 30 Minutes Intravenous Every 24 hours 08/18/20 0237 08/18/20 1344   08/18/20 1500  ceFAZolin (ANCEF) IVPB 2g/100 mL premix        2 g 200 mL/hr over 30 Minutes Intravenous Every 8 hours 08/18/20 1344     08/18/20 0000  cefTRIAXone (ROCEPHIN) 1 g in sodium chloride 0.9 % 100 mL IVPB        1 g 200 mL/hr over 30 Minutes Intravenous  Once 08/17/20 2356 08/18/20 0202        I have personally reviewed the following labs and images: CBC: Recent Labs  Lab 08/17/20 2351 08/18/20 1055 08/19/20 0621 08/20/20 0448  WBC 10.7* 8.2 7.6 10.5  NEUTROABS 8.1*  --   --   --   HGB 13.2 11.3* 12.5 13.0  HCT 40.8 35.7* 37.0 40.1  MCV 88.1 89.9 86.0 87.4  PLT 192 177 194 228   BMP &GFR Recent Labs  Lab 08/17/20 2351 08/18/20 1055 08/19/20 0621 08/20/20 0448  NA 139 142 140 137  K 3.6 3.5 3.0* 3.4*  CL 96* 100 94* 91*  CO2 33* 31 33* 34*  GLUCOSE 103* 160* 163* 249*  BUN 16 14 10 11   CREATININE 0.84 0.66 0.73 0.75  CALCIUM 10.1 9.1 10.2 10.5*  MG  --   --  1.7 1.8   Estimated Creatinine Clearance: 100.8 mL/min (by C-G formula based on SCr of 0.75 mg/dL). Liver & Pancreas: Recent Labs  Lab 08/17/20 2351  AST 55*  ALT 23  ALKPHOS 96  BILITOT 0.7  PROT 7.4  ALBUMIN 3.5   No results for input(s): LIPASE, AMYLASE in the last 168 hours. No results for input(s): AMMONIA in the last 168 hours. Diabetic: Recent Labs    08/18/20 0620  HGBA1C 7.3*   Recent Labs  Lab 08/19/20 2026 08/20/20 0036 08/20/20 0414 08/20/20 0747 08/20/20 1132  GLUCAP 261*  258* 255* 243* 256*   Cardiac Enzymes: Recent Labs  Lab 08/17/20 2351 08/19/20 1450  CKTOTAL 597* 195   No results for input(s): PROBNP in the last 8760 hours. Coagulation Profile: Recent Labs  Lab 08/18/20 0000  INR 0.9   Thyroid Function Tests: Recent Labs  08/17/20 2251  TSH 0.752   Lipid Profile: Recent Labs    08/18/20 1059  CHOL 196  HDL 59  LDLCALC 114*  TRIG 113  CHOLHDL 3.3   Anemia Panel: No results for input(s): VITAMINB12, FOLATE, FERRITIN, TIBC, IRON, RETICCTPCT in the last 72 hours. Urine analysis:    Component Value Date/Time   COLORURINE YELLOW (A) 08/18/2020 0011   APPEARANCEUR HAZY (A) 08/18/2020 0011   APPEARANCEUR Clear 10/22/2014 1539   LABSPEC 1.015 08/18/2020 0011   LABSPEC 1.010 10/22/2014 1539   PHURINE 6.0 08/18/2020 0011   GLUCOSEU NEGATIVE 08/18/2020 0011   GLUCOSEU 150 mg/dL 10/22/2014 1539   HGBUR SMALL (A) 08/18/2020 0011   BILIRUBINUR NEGATIVE 08/18/2020 0011   BILIRUBINUR Negative 10/22/2014 1539   KETONESUR NEGATIVE 08/18/2020 0011   PROTEINUR 100 (A) 08/18/2020 0011   UROBILINOGEN 0.2 07/03/2009 1105   NITRITE NEGATIVE 08/18/2020 0011   LEUKOCYTESUR TRACE (A) 08/18/2020 0011   LEUKOCYTESUR Negative 10/22/2014 1539   Sepsis Labs: Invalid input(s): PROCALCITONIN, [place]  Microbiology: Recent Results (from the past 240 hour(s))  SARS Coronavirus 2 by RT PCR (hospital order, performed in [facility] hospital lab) Nasopharyngeal Nasopharyngeal Swab     Status: None   Collection Time: 08/18/20 12:00 AM   Specimen: Nasopharyngeal Swab  Result Value Ref Range Status   SARS Coronavirus 2 NEGATIVE NEGATIVE Final    Comment: (NOTE) SARS-CoV-2 target nucleic acids are NOT DETECTED.  The SARS-CoV-2 RNA is generally detectable in upper and lower respiratory specimens during the acute phase of infection. The lowest concentration of SARS-CoV-2 viral copies this assay can detect is 250 copies / mL. A negative result does  not preclude SARS-CoV-2 infection and should not be used as the sole basis for treatment or other patient management decisions.  A negative result may occur with improper specimen collection / handling, submission of specimen other than nasopharyngeal swab, presence of viral mutation(s) within the areas targeted by this assay, and inadequate number of viral copies (<250 copies / mL). A negative result must be combined with clinical observations, patient history, and epidemiological information.  Fact Sheet for Patients:   StrictlyIdeas.no  Fact Sheet for Healthcare Providers: BankingDealers.co.za  This test is not yet approved or  cleared by the [place] FDA and has been authorized for detection and/or diagnosis of SARS-CoV-2 by FDA under an Emergency Use Authorization (EUA).  This EUA will remain in effect (meaning this test can be used) for the duration of the COVID-19 declaration under Section 564(b)(1) of the Act, 21 U.S.C. section 360bbb-3(b)(1), unless the authorization is terminated or revoked sooner.  Performed at [facility], [address] [place], [place] [address]   Urine culture     Status: Abnormal   Collection Time: 08/18/20 12:11 AM   Specimen: In/Out Cath Urine  Result Value Ref Range Status   Specimen Description   Final    IN/OUT CATH URINE Performed at [facility], [address], [place], [place] [address]    Special Requests   Final    NONE Performed at [facility], [address] [place],  [address]    Culture MULTIPLE SPECIES PRESENT, SUGGEST RECOLLECTION (A)  Final   Report Status 08/18/2020 FINAL  Final  Blood culture (routine single)     Status: None (Preliminary result)   Collection Time: 08/18/20 12:35 AM   Specimen: BLOOD  Result Value Ref Range Status   Specimen Description BLOOD LEFT ANTECUBITAL  Final   Special Requests  Final    BOTTLES DRAWN  AEROBIC ONLY Blood Culture adequate volume   Culture   Final    NO GROWTH 2 DAYS Performed at [facility], [address], [place], [place] [address]    Report Status PENDING  Incomplete    Radiology Studies: No results found.    Matthew Pais T. [place]  If 7PM-7AM, please contact night-coverage [email or web address] 08/20/2020, 11:42 AM

## 2020-08-21 DIAGNOSIS — E1165 Type 2 diabetes mellitus with hyperglycemia: Secondary | ICD-10-CM

## 2020-08-21 DIAGNOSIS — Z9989 Dependence on other enabling machines and devices: Secondary | ICD-10-CM

## 2020-08-21 DIAGNOSIS — Z9189 Other specified personal risk factors, not elsewhere classified: Secondary | ICD-10-CM

## 2020-08-21 LAB — RENAL FUNCTION PANEL
Albumin: 3.3 g/dL — ABNORMAL LOW (ref 3.5–5.0)
Anion gap: 10 (ref 5–15)
BUN: 14 mg/dL (ref 8–23)
CO2: 35 mmol/L — ABNORMAL HIGH (ref 22–32)
Calcium: 10 mg/dL (ref 8.9–10.3)
Chloride: 91 mmol/L — ABNORMAL LOW (ref 98–111)
Creatinine, Ser: 0.72 mg/dL (ref 0.44–1.00)
GFR calc Af Amer: >60 mL/min (ref >60)
GFR calc non Af Amer: >60 mL/min (ref >60)
Glucose, Bld: 261 mg/dL — ABNORMAL HIGH (ref 70–99)
Phosphorus: 3 mg/dL (ref 2.5–4.6)
Potassium: 3.3 mmol/L — ABNORMAL LOW (ref 3.5–5.1)
Sodium: 136 mmol/L (ref 135–145)

## 2020-08-21 LAB — GLUCOSE, CAPILLARY
Glucose-Capillary: 226 mg/dL — ABNORMAL HIGH (ref 70–99)
Glucose-Capillary: 242 mg/dL — ABNORMAL HIGH (ref 70–99)

## 2020-08-21 LAB — MAGNESIUM: Magnesium: 1.8 mg/dL (ref 1.7–2.4)

## 2020-08-21 MED ORDER — INSULIN ASPART 100 UNIT/ML ~~LOC~~ SOLN
8.0000 [IU] | Freq: Three times a day (TID) | SUBCUTANEOUS | Status: DC
  Filled 2020-08-21: qty 1

## 2020-08-21 MED ORDER — ZOLPIDEM TARTRATE 10 MG PO TABS: 5.0000 mg | ORAL_TABLET | Freq: Every day | ORAL | 0 refills | Status: DC

## 2020-08-21 MED ORDER — POTASSIUM CHLORIDE CRYS ER 20 MEQ PO TBCR
40.0000 meq | EXTENDED_RELEASE_TABLET | ORAL | Status: DC
  Filled 2020-08-21 (×2): qty 2

## 2020-08-21 MED ORDER — CEPHALEXIN 500 MG PO CAPS: 500.0000 mg | ORAL_CAPSULE | Freq: Three times a day (TID) | ORAL | 0 refills | Status: AC

## 2020-08-21 MED ORDER — GABAPENTIN 600 MG PO TABS
ORAL_TABLET | ORAL | Status: DC
Start: 2020-08-21 — End: 2021-11-28

## 2020-08-21 MED ORDER — INSULIN GLARGINE 100 UNIT/ML ~~LOC~~ SOLN
35.0000 [IU] | Freq: Two times a day (BID) | SUBCUTANEOUS | Status: DC
  Administered 2020-08-21: 35 [IU] via SUBCUTANEOUS
  Filled 2020-08-21 (×2): qty 0.35

## 2020-08-21 NOTE — Progress Notes (Signed)
Patient is stable and ready for discharge home. Patient's IV removed. Patient had her heart monitor placed by staff at [facility] clinic and she went over the monitor with patient. Patient's belongings packed by her daughter. Writer went over discharge paperwork with patient and daughter and on how the heart monitor works when to charge and when to discard and how to return monitoring device once removed September 21, 2020. Daughter verbalized understanding for all information provided and had no questions. Patient transported to daughter's car via [place] by NT.

## 2020-08-21 NOTE — Plan of Care (Signed)
  Problem: Education: Goal: Knowledge of General Education information will improve Description Including pain rating scale, medication(s)/side effects and non-pharmacologic comfort measures Outcome: Progressing   

## 2020-08-21 NOTE — Discharge Summary (Signed)
Physician Discharge Summary  MELVINIA ASHBY ZOX:[id number] DOB: [birth year] DOA: 08/17/2020  PCP: Rusty Aus, MD  Admit date: 08/17/2020 Discharge date: 08/21/2020  Admitted From: Home Disposition: Home  Recommendations for Outpatient Follow-up:  1. Follow ups as below. 2. Please obtain CBC/BMP/Mag at follow up 3. Please review medications at follow-up.  Patient is at risk for polypharmacy 4. Please follow up on the following pending results: None  Home Health: PT/OT/RN/aide Equipment/Devices: Patient has appropriate DME  Discharge Condition: Stable CODE STATUS: Full code   Follow-up Information    Yolonda Kida, MD. Daphane Shepherd on 10/09/2020.   Specialties: Cardiology, Internal Medicine Why: after 30-day Loop monitor Contact information: [place] [address] [phone]        Rusty Aus, MD. Schedule an appointment as soon as possible for a visit on 08/26/2020.   Specialty: Internal Medicine Why: (Appt w/ Grayland Ormond, PA-C)  @ 11:00 am Contact information: [place] [address] [phone]                Hospital Course: 69 y.o.femalewith a history of morbid obesity, diabetes, hypothyroidism, hypertension, venous stasis, COPD, OSA/possible OHS on CPAP, CAD, chronic back pain with spinal cord stimulator, chronic hypoxia on O2 at rest,below s/p left TKA who presented to the ED from home after being on her floor next to her bed for 2 days and does not recall a fall or syncopal event anything leading up to this eventwho was admittedfor bilateral lower extremity cellulitis and initially started on ceftriaxone.  Patient reports that she was in her usual state of health until she underwent an endoscopy on 7/23 for variceal screening at which point recalls having double vision ever since she woke up from anesthesia. She was seen by her physician who reported a 6th nerve palsy  on the right. She had a CTA head on 7/27 which showed a small chronic cerebellar infarct as well as intracranial atherosclerosis including a moderate to severe stenosis at the left MCA bifurcation to moderate left P1 and Severe left P2 stenosis.Since that time the patient has had worsening fatigue and at times has noted speech changes and word finding difficulty. Even noted that the phone kept slipping from her handwhile on the phone last week.  Neurology consulted.  CT head without acute finding but unchanged left cerebellar infarct and unchanged large left sylvian fissure. CTA head and neck with moderate to marked stenosis at an early left MCA bifurcation and up to marked stenosis and left P2 PCA. Unfortunately, not able to get MRI brain due to remote spinal cord stimulator that could not be tested. TTE without significant finding.  Neurology recommended 30-day event monitor and signed off.  Cardiology consulted and placed a 30-day event monitor on the day of discharge.   In regards to possible bilateral cellulitis, patient received ceftriaxone  And switched to Ancef.    She was discharged on p.o. Keflex for 5 more days.  Therapy recommended SNF but patient refused SNF and discharged home with home health and DME.  Patient is at risk of polypharmacy.  Extensively discussed the risk.  She is adamant about changing her medications.  She is a former Education administrator.  I strongly encouraged her to discuss this with her prescriber/primary care doctor.   Discharge Diagnoses:  Unwitnessed fall at home-unclear etiology but could be syncopal, CVA or polypharmacy. Ambulatory dysfunction-walker and wheelchair bound at baseline. 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 Cerebrovascular disease Remote Left Cerebellar Infarct -CTH without contrast without acute finding.   -CTA head and neck with moderate to marked stenosis at an early left MCA bifurcation and up to marked stenosis and left P2  PCA -MRI was not done due to spinal cord stimulator.  Could not be tested due to remote and not being charged. -TTE without significant finding.  A1c 7.3%.  LDL 113. -Neurology recommended 30-day event monitor.  Placed by cardiology on the day of discharge. -Continue Crestor -Therapy recommended SNF but patient refused.  Home health PT/OT/RN/aide ordered.  Mild rhabdomyolysis: Likely traumatic.  CK 597>> 195.  Resolved.  Bilateral lower extremity nonpurulent cellulitis in a patient with chronic venous insufficiency-difficult diagnosis.  Seems to have improved based on my exam. -Discharged on Keflex 500 mg 3 times daily to complete 10 days total course  Debility/physical deconditioning: Walker and wheelchair dependent at baseline.   -Home health PT/OT/RN/aide -Patient has appropriate DME's.  Chronic pain syndrome status post spinal cord stimulator -Discharged on home Percocet, gabapentin, tramadol and Flexeril.  -Extensive discussion about risk of taking multiple sedating medications but she is very resistant.  I encouraged her to discuss this with her PCP.  Uncontrolled DM-2 with hyperglycemia: A1c 7.3%. -Discharged on home regimen and a statin. -She could benefit from GLP-1 inhibitors given morbid obesity.  Uncontrolled hypertension/resistant hypertension-maxed on multiple antihypertensive regimens including diuretics. -Discharged on home medications.  Chronic respiratory failure with hypoxia/OSA/possible OHS: On 3 L and CPAP at baseline. -Continue CPAP and supplemental oxygen as needed -Encourage her to use minimum oxygen to keep saturation above 88%. -Encourage lifestyle change to lose weight.  Chronic lymphedema/venous insufficiency -On metolazone and Lasix  Anxiety and depression-stable. -Continue home medications.  Right eye conjunctivitis: Resolved.  Hypokalemia/hypomagnesemia: Resolved. -Replenish and recheck  Altered mental status/confusion-: Resolved.   Suspect polypharmacy.  Constipation-resolved. -continue Linzess and other bowel regimens  At risk for polypharmacy: Very concerning. -Extensive discussion as above  Morbid obesity Body mass index is 51.6 kg/m. -Could benefit from GLP-1 inhibitors given history of diabetes    Body mass index is 51.6 kg/m.            Discharge Exam: Vitals:   08/21/20 0044 08/21/20 0726  BP: (!) 158/64 (!) 164/75  Pulse: 69 69  Resp: 17 17  Temp: 99 F (37.2 C) 97.8 F (36.6 C)  SpO2: 96% 93%    GENERAL: No apparent distress.  Nontoxic. HEENT: MMM.  Diplopia that improves with covering 1 eye. NECK: Supple.  No apparent JVD but limited exam due to body habitus.Marland Kitchen  RESP: On 3 L.  No IWOB.  Fair aeration bilaterally but limited exam due to body habitus. CVS:  RRR. Heart sounds normal.  ABD/GI/GU: Bowel sounds present. Soft. Non tender.  MSK/EXT:  Moves extremities.  Chronic venous insufficiency.  No erythema or increased warmth to touch. SKIN: Chronic venous insufficiency in both legs NEURO: Awake, alert and oriented appropriately.  Diplopia that improves with covering 1 eye.  Otherwise, no apparent focal neuro deficit. PSYCH: Calm. Normal affect.  Discharge Instructions  Discharge Instructions    Call MD for:  difficulty breathing, headache or visual disturbances   Complete by: As directed    Call MD for:  extreme fatigue   Complete by: As directed    Call MD for:  persistant dizziness or light-headedness   Complete by: As directed    Call MD for:  persistant nausea and vomiting   Complete by: As directed  Call MD for:  severe uncontrolled pain   Complete by: As directed    Call MD for:  temperature >100.4   Complete by: As directed    Diet - low sodium heart healthy   Complete by: As directed    Diet Carb Modified   Complete by: As directed    Discharge instructions   Complete by: As directed    It has been a pleasure taking care of you!  You were hospitalized  after fall at home.  It is unclear why you fell.  It is possible that you might have passed out.  There was also concern about stroke.  Obviously, you pain medications puts you at very high risk for fall. You were treated for possible cellulitis of your legs.  You are discharged on oral antibiotic to complete treatment course for possible infection.   You are on multiple sedating medications which would increase your risk of fall and confusion.  We strongly recommend you discuss your medications with your primary care doctor or your prescriber.  Cardiologist will be monitoring your heart activity to make sure your fall is not related to abnormal heart rhythm.  We may have started you on other new medications or made some changes to your home medications during this hospitalization. Please review your new medication list and the directions carefully before you take them.    Please go to your hospital follow-up appointments or call to reschedule as recommended.   Take care,   Increase activity slowly   Complete by: As directed      Allergies as of 08/21/2020      Reactions   Eggs Or Egg-derived Products Nausea And Vomiting   Only has reaction to flu vaccine due to eggs, no reaction to egg food products   Shellfish Allergy Nausea And Vomiting, Diarrhea, Nausea Only   Non stop sickness once ingests shellfish. Betadine is okay   Amlodipine Cough   Codeine Nausea And Vomiting, Nausea Only   states she can take codeine cough syrup without problems states she can take codeine cough syrup without problems   Imdur [isosorbide Dinitrate] Other (See Comments), Cough   Headache   Lyrica [pregabalin] Swelling, Other (See Comments)   "FLU-LIKE" SYMPTOMS   Monosodium Glutamate Other (See Comments), Cough   Headache   Tape Other (See Comments), Nausea And Vomiting   can use paper tape Redness      Medication List    STOP taking these medications   Levemir FlexTouch 100 UNIT/ML FlexPen Generic  drug: insulin detemir     TAKE these medications   budesonide 0.5 MG/2ML nebulizer solution Commonly known as: PULMICORT Take 2 mLs by nebulization daily.   butalbital-acetaminophen-caffeine 50-325-40 MG tablet Commonly known as: FIORICET Take 1 tablet by mouth 2 (two) times daily as needed for migraine.   carvedilol 25 MG tablet Commonly known as: COREG Take 50 mg by mouth 2 (two) times daily with a meal.   cephALEXin 500 MG capsule Commonly known as: KEFLEX Take 1 capsule (500 mg total) by mouth 3 (three) times daily for 5 days.   cloNIDine 0.3 mg/24hr patch Commonly known as: CATAPRES - Dosed in mg/24 hr Place 0.3 mg onto the skin once a week.   cyclobenzaprine 10 MG tablet Commonly known as: FLEXERIL Take 20 mg by mouth at bedtime.   doxazosin 4 MG tablet Commonly known as: CARDURA Take 4 mg by mouth every evening.   escitalopram 10 MG tablet Commonly known as: LEXAPRO Take  10 mg by mouth daily.   fluticasone 50 MCG/ACT nasal spray Commonly known as: FLONASE Place 2 sprays into both nostrils as needed.   furosemide 40 MG tablet Commonly known as: LASIX Take 40 mg by mouth daily.   gabapentin 600 MG tablet Commonly known as: NEURONTIN Take 1 tablet (600mg ) by mouth twice daily and take 3 tablets (1800mg ) by mouth at bedtime What changed:   how much to take  how to take this  when to take this   hydrALAZINE 50 MG tablet Commonly known as: APRESOLINE Take 50 mg by mouth 3 (three) times daily.   ibuprofen 800 MG tablet Commonly known as: ADVIL Take 800 mg by mouth 3 (three) times daily as needed for mild pain or moderate pain.   insulin pump Soln Inject into the skin.   insulin regular human CONCENTRATED 500 UNIT/ML kwikpen Commonly known as: HUMULIN R Inject 40-80 Units into the skin See admin instructions. Uses insulin pump   ipratropium-albuterol 0.5-2.5 (3) MG/3ML Soln Commonly known as: DUONEB Take 3 mLs by nebulization every 6 (six)  hours.   levothyroxine 200 MCG tablet Commonly known as: SYNTHROID Take 200 mcg by mouth daily before breakfast.   Linzess 290 MCG Caps capsule Generic drug: linaclotide Take 290 mcg by mouth daily.   metoCLOPramide 10 MG tablet Commonly known as: REGLAN Take 10 mg by mouth 3 (three) times daily.   metolazone 5 MG tablet Commonly known as: ZAROXOLYN Take 5 mg by mouth 2 (two) times a week.   ondansetron 4 MG tablet Commonly known as: ZOFRAN Take 4 mg by mouth every 12 (twelve) hours as needed for nausea or vomiting.   oxyCODONE-acetaminophen 10-325 MG tablet Commonly known as: Percocet Take 1 tablet by mouth every 12 (twelve) hours as needed for pain. Must last 30 days. What changed: Another medication with the same name was removed. Continue taking this medication, and follow the directions you see here.   pantoprazole 40 MG tablet Commonly known as: PROTONIX Take 40 mg by mouth 2 (two) times daily.   potassium chloride 10 MEQ tablet Commonly known as: KLOR-CON Take 10 mEq by mouth daily.   pramipexole 0.5 MG tablet Commonly known as: MIRAPEX Take 0.5 mg by mouth 2 (two) times daily.   rosuvastatin 20 MG tablet Commonly known as: CRESTOR Take 20 mg by mouth daily.   telmisartan 40 MG tablet Commonly known as: MICARDIS Take 40 mg by mouth daily. Notes to patient: Not given in hospital   traMADol 50 MG tablet Commonly known as: ULTRAM Take 100 mg by mouth at bedtime as needed.   traZODone 150 MG tablet Commonly known as: DESYREL Take 150 mg by mouth at bedtime.   Ventolin HFA 108 (90 Base) MCG/ACT inhaler Generic drug: albuterol Inhale 2 puffs into the lungs 3 (three) times daily as needed for wheezing or shortness of breath. Notes to patient: Not given in hsopital   Xanax 0.5 MG tablet Generic drug: ALPRAZolam Take 0.5 mg by mouth 4 (four) times daily. Take   zolpidem 10 MG tablet Commonly known as: AMBIEN Take 0.5 tablets (5 mg total) by mouth at  bedtime. What changed: how much to take       Consultations:  Neurology  Cardiology  Procedures/Studies:  2D Echo on 08/18/2020 1. Left ventricular ejection fraction, by estimation, is >55%. The left  ventricle has normal function. Left ventricular endocardial border not  optimally defined to evaluate regional wall motion. There is moderate left  ventricular hypertrophy.  Left  ventricular diastolic parameters are consistent with Grade I diastolic  dysfunction (impaired relaxation).  2. Right ventricular systolic function is normal. The right ventricular  size is not well visualized. Tricuspid regurgitation signal is inadequate  for assessing PA pressure.  3. The mitral valve is grossly normal. No evidence of mitral valve  regurgitation. No evidence of mitral stenosis.  4. The aortic valve was not well visualized. Aortic valve regurgitation  is not visualized. No aortic stenosis is present.    CT ANGIO HEAD W OR WO CONTRAST  Result Date: 07/22/2020 CLINICAL DATA:  Diplopia since a recent endoscopy. EXAM: CT ANGIOGRAPHY HEAD TECHNIQUE: Multidetector CT imaging of the head was performed using the standard protocol during bolus administration of intravenous contrast. Multiplanar CT image reconstructions and MIPs were obtained to evaluate the vascular anatomy. CONTRAST:  49mL OMNIPAQUE IOHEXOL 350 MG/ML SOLN COMPARISON:  Head CT 07/30/2019 and MRA 07/11/2012 FINDINGS: CT HEAD Brain: There is no evidence of an acute infarct, intracranial hemorrhage, mass, midline shift, or extra-axial fluid collection. A small chronic infarct superiorly in the left cerebellar hemisphere is unchanged. There is also an unchanged subcentimeter calcification anteriorly in the right temporal lobe. The ventricles are normal in size. Mild asymmetric enlargement of the left sylvian fissure is unchanged. Vascular: Calcified atherosclerosis at the skull base. Skull: No fracture or suspicious osseous lesion.  Sinuses: Paranasal sinuses and mastoid air cells are clear. Orbits: Left cataract extraction. CTA HEAD Anterior circulation: The internal carotid arteries are patent from skull base to carotid termini with mild atherosclerotic plaque being greater on the right where there is a mild stenosis at the level of the anterior genu. ACAs and MCAs are patent without evidence of a proximal branch occlusion. There is an early bifurcation of the left MCA with a moderate to severe stenosis at the bifurcation involving the superior greater than inferior M2 division origins. There is a mild-to-moderate stenosis of the distal right M1 segment. No aneurysm is identified. Posterior circulation: The visualized distal vertebral arteries are patent to the basilar and codominant. There is mild atherosclerotic irregularity of the right V4 segment without significant stenosis. Patent PICA, AICA, and SCA origins are identified bilaterally. There are large right and small left posterior communicating arteries. The right P1 segment is hypoplastic, and there may be a superimposed severe proximal P1 stenosis. On the left, there are moderate distal P1 and severe mid P2 stenoses. No aneurysm is identified. Venous sinuses: Patent. Anatomic variants: Predominantly fetal origin of the right PCA. IMPRESSION: 1. No evidence of acute intracranial abnormality. Small chronic cerebellar infarct. 2. Intracranial atherosclerosis including a moderate to severe stenosis at the left MCA bifurcation and moderate left P1 and severe left P2 stenoses. Electronically Signed   By: Logan Bores M.D.   On: 07/22/2020 16:11   CT Head Wo Contrast  Result Date: 08/18/2020 CLINICAL DATA:  Syncope EXAM: CT HEAD WITHOUT CONTRAST TECHNIQUE: Contiguous axial images were obtained from the base of the skull through the vertex without intravenous contrast. COMPARISON:  None. FINDINGS: Brain: There is no mass, hemorrhage or extra-axial collection. The size and configuration  of the ventricles and extra-axial CSF spaces are normal. The brain parenchyma is normal, without acute infarction. 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 Vascular: No abnormal hyperdensity of the major intracranial arteries or dural venous sinuses. No intracranial atherosclerosis. Skull: The visualized skull base, calvarium and extracranial soft tissues are normal. Sinuses/Orbits: No fluid levels or  advanced mucosal thickening of the visualized paranasal sinuses. No mastoid or middle ear effusion. The orbits are normal. IMPRESSION: 1. No acute intracranial abnormality. 2. 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 3. Unchanged left cerebellar infarct. Electronically Signed   By: Ulyses Jarred M.D.   On: 08/18/2020 01:18   DG Chest Port 1 View  Result Date: 08/18/2020 CLINICAL DATA:  Sepsis EXAM: PORTABLE CHEST 1 VIEW COMPARISON:  07/30/2019 FINDINGS: The heart size and mediastinal contours are within normal limits. Both lungs are clear. The visualized skeletal structures are unremarkable. IMPRESSION: No active disease. Electronically Signed   By: Ulyses Jarred M.D.   On: 08/18/2020 00:44   ECHOCARDIOGRAM COMPLETE  Result Date: 08/19/2020    ECHOCARDIOGRAM REPORT   Patient Name:   MERIEL KELLIHER Date of Exam: 08/18/2020 Medical Rec #:  [id number]        Height:       66.0 in Accession #:    [id number]       Weight:       319.7 lb Date of Birth:  [birth year]         BSA:          2.441 m Patient Age:    33 years         BP:           135/61 mmHg Patient Gender: F                HR:           72 bpm. Exam Location:  [facility] Procedure: 2D Echo, Color Doppler and Cardiac Doppler Indications:     Syncope 780.2 / R55  History:         Patient has no prior history of Echocardiogram examinations.                  CAD; Risk Factors:Hypertension. MI.  Sonographer:     Alyse Low Roar Referring Phys:  [id number]  Harold Hedge Diagnosing Phys: Nelva Bush MD  Sonographer Comments: Technically difficult study due to poor echo windows. IMPRESSIONS  1. Left ventricular ejection fraction, by estimation, is >55%. The left ventricle has normal function. Left ventricular endocardial border not optimally defined to evaluate regional wall motion. There is moderate left ventricular hypertrophy. Left ventricular diastolic parameters are consistent with Grade I diastolic dysfunction (impaired relaxation).  2. Right ventricular systolic function is normal. The right ventricular size is not well visualized. Tricuspid regurgitation signal is inadequate for assessing PA pressure.  3. The mitral valve is grossly normal. No evidence of mitral valve regurgitation. No evidence of mitral stenosis.  4. The aortic valve was not well visualized. Aortic valve regurgitation is not visualized. No aortic stenosis is present. FINDINGS  Left Ventricle: Left ventricular ejection fraction, by estimation, is >55%. The left ventricle has normal function. Left ventricular endocardial border not optimally defined to evaluate regional wall motion. The left ventricular internal cavity size was  normal in size. There is moderate left ventricular hypertrophy. Left ventricular diastolic parameters are consistent with Grade I diastolic dysfunction (impaired relaxation). Right Ventricle: The right ventricular size is not well visualized. No increase in right ventricular wall thickness. Right ventricular systolic function is normal. Tricuspid regurgitation signal is inadequate for assessing PA pressure. Left Atrium: Left atrial size was normal in size. Right Atrium: Right atrial size was normal in size. Pericardium: The pericardium was not well visualized. Presence of pericardial fat pad. Mitral Valve: The mitral valve  is grossly normal. No evidence of mitral valve regurgitation. No evidence of mitral valve stenosis. Tricuspid Valve: The tricuspid valve is not  well visualized. Tricuspid valve regurgitation is not demonstrated. Aortic Valve: The aortic valve was not well visualized. Aortic valve regurgitation is not visualized. No aortic stenosis is present. Aortic valve peak gradient measures 12.7 mmHg. Pulmonic Valve: The pulmonic valve was not well visualized. Pulmonic valve regurgitation is not visualized. No evidence of pulmonic stenosis. Aorta: The aortic root was not well visualized. Pulmonary Artery: The pulmonary artery is not well seen. Venous: The inferior vena cava was not well visualized. IAS/Shunts: The interatrial septum was not well visualized.  LEFT VENTRICLE PLAX 2D LVIDd:         5.05 cm  Diastology LVIDs:         3.50 cm  LV e' lateral:   8.92 cm/s LV PW:         1.22 cm  LV E/e' lateral: 9.1 LV IVS:        1.34 cm  LV e' medial:    7.62 cm/s LVOT diam:     1.90 cm  LV E/e' medial:  10.7 LVOT Area:     2.84 cm  RIGHT VENTRICLE RV Mid diam:    3.31 cm RV S prime:     14.70 cm/s LEFT ATRIUM             Index       RIGHT ATRIUM           Index LA diam:        4.00 cm 1.64 cm/m  RA Area:     17.30 cm LA Vol (A2C):   57.1 ml 23.39 ml/m RA Volume:   45.50 ml  18.64 ml/m LA Vol (A4C):   43.2 ml 17.70 ml/m LA Biplane Vol: 49.8 ml 20.40 ml/m  AORTIC VALVE                PULMONIC VALVE AV Area (Vmax): 1.83 cm    PV Vmax:        1.28 m/s AV Vmax:        178.00 cm/s PV Peak grad:   6.6 mmHg AV Peak Grad:   12.7 mmHg   RVOT Peak grad: 4 mmHg LVOT Vmax:      115.00 cm/s  MITRAL VALVE MV Area (PHT): 6.12 cm    SHUNTS MV Decel Time: 124 msec    Systemic Diam: 1.90 cm MV E velocity: 81.40 cm/s MV A velocity: 97.30 cm/s MV E/A ratio:  0.84 MV A Prime:    9.7 cm/s Nelva Bush MD Electronically signed by Nelva Bush MD Signature Date/Time: 08/19/2020/3:27:56 PM    Final    CT ANGIO HEAD CODE STROKE  Result Date: 08/18/2020 CLINICAL DATA:  Syncope, left neck pain EXAM: CT ANGIOGRAPHY HEAD AND NECK TECHNIQUE: Multidetector CT imaging of the head and neck  was performed using the standard protocol during bolus administration of intravenous contrast. Multiplanar CT image reconstructions and MIPs were obtained to evaluate the vascular anatomy. Carotid stenosis measurements (when applicable) are obtained utilizing NASCET criteria, using the distal internal carotid diameter as the denominator. CONTRAST:  144mL OMNIPAQUE IOHEXOL 350 MG/ML SOLN COMPARISON:  CTA head 07/22/2020, CT head earlier same day FINDINGS: CT HEAD Brain: There is no acute intracranial hemorrhage, mass effect, or edema. Gray-white differentiation is preserved. There is no extra-axial fluid collection. Chronic left cerebellar infarct. Ventricles and sulci are stable in size and configuration. Vascular: There is atherosclerotic  calcification at the skull base. Skull: Calvarium is unremarkable. Sinuses/Orbits: No acute finding. Other: None. Review of the MIP images confirms the above findings CTA NECK Aortic arch: Great vessel origins are patent. Right carotid system: Patent. Plaque at the ICA origin causes minimal stenosis. Partial retropharyngeal course Left carotid system: Patent. No measurable stenosis at the ICA origin. Partial retropharyngeal course. Vertebral arteries: Patent. Origins are partially obscured by artifact. No measurable stenosis or evidence of dissection. Skeleton: Prior anterior fusion at C5-C6. Degenerative changes of the cervical spine. Other neck: No mass or adenopathy. Upper chest: No apical lung mass. Review of the MIP images confirms the above findings CTA HEAD Anterior circulation: Intracranial internal carotid arteries are patent with mild calcified plaque. Anterior and middle cerebral arteries are patent. 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 Posterior circulation: Intracranial vertebral arteries, basilar artery, and posterior cerebral arteries are patent. There is fetal or near fetal origin of the right posterior cerebral artery. Left P2 PCA  atherosclerotic irregularity and up to marked stenosis. Venous sinuses: Patent as allowed by contrast bolus timing. Review of the MIP images confirms the above findings IMPRESSION: No acute intracranial abnormality. No large vessel occlusion, hemodynamically significant stenosis, or evidence of dissection in the neck. No proximal intracranial vessel occlusion. 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 Up to marked stenosis of the left P2 PCA. Electronically Signed   By: Macy Mis M.D.   On: 08/18/2020 16:02   CT ANGIO NECK CODE STROKE  Result Date: 08/18/2020 CLINICAL DATA:  Syncope, left neck pain EXAM: CT ANGIOGRAPHY HEAD AND NECK TECHNIQUE: Multidetector CT imaging of the head and neck was performed using the standard protocol during bolus administration of intravenous contrast. Multiplanar CT image reconstructions and MIPs were obtained to evaluate the vascular anatomy. Carotid stenosis measurements (when applicable) are obtained utilizing NASCET criteria, using the distal internal carotid diameter as the denominator. CONTRAST:  158mL OMNIPAQUE IOHEXOL 350 MG/ML SOLN COMPARISON:  CTA head 07/22/2020, CT head earlier same day FINDINGS: CT HEAD Brain: There is no acute intracranial hemorrhage, mass effect, or edema. Gray-white differentiation is preserved. There is no extra-axial fluid collection. Chronic left cerebellar infarct. Ventricles and sulci are stable in size and configuration. Vascular: There is atherosclerotic calcification at the skull base. Skull: Calvarium is unremarkable. Sinuses/Orbits: No acute finding. Other: None. Review of the MIP images confirms the above findings CTA NECK Aortic arch: Great vessel origins are patent. Right carotid system: Patent. Plaque at the ICA origin causes minimal stenosis. Partial retropharyngeal course Left carotid system: Patent. No measurable stenosis at the ICA origin. Partial retropharyngeal course. Vertebral arteries: Patent. Origins  are partially obscured by artifact. No measurable stenosis or evidence of dissection. Skeleton: Prior anterior fusion at C5-C6. Degenerative changes of the cervical spine. Other neck: No mass or adenopathy. Upper chest: No apical lung mass. Review of the MIP images confirms the above findings CTA HEAD Anterior circulation: Intracranial internal carotid arteries are patent with mild calcified plaque. Anterior and middle cerebral arteries are patent. 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 Posterior circulation: Intracranial vertebral arteries, basilar artery, and posterior cerebral arteries are patent. There is fetal or near fetal origin of the right posterior cerebral artery. Left P2 PCA atherosclerotic irregularity and up to marked stenosis. Venous sinuses: Patent as allowed by contrast bolus timing. Review of the MIP images confirms the above findings IMPRESSION: No acute intracranial abnormality. No large vessel occlusion, hemodynamically significant stenosis, or evidence of  dissection in the neck. No proximal intracranial vessel occlusion. 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 Up to marked stenosis of the left P2 PCA. Electronically Signed   By: Macy Mis M.D.   On: 08/18/2020 16:02        The results of significant diagnostics from this hospitalization (including imaging, microbiology, ancillary and laboratory) are listed below for reference.     Microbiology: Recent Results (from the past 240 hour(s))  SARS Coronavirus 2 by RT PCR (hospital order, performed in [facility] hospital lab) Nasopharyngeal Nasopharyngeal Swab     Status: None   Collection Time: 08/18/20 12:00 AM   Specimen: Nasopharyngeal Swab  Result Value Ref Range Status   SARS Coronavirus 2 NEGATIVE NEGATIVE Final    Comment: (NOTE) SARS-CoV-2 target nucleic acids are NOT DETECTED.  The SARS-CoV-2 RNA is generally detectable in upper and lower respiratory specimens during the  acute phase of infection. The lowest concentration of SARS-CoV-2 viral copies this assay can detect is 250 copies / mL. A negative result does not preclude SARS-CoV-2 infection and should not be used as the sole basis for treatment or other patient management decisions.  A negative result may occur with improper specimen collection / handling, submission of specimen other than nasopharyngeal swab, presence of viral mutation(s) within the areas targeted by this assay, and inadequate number of viral copies (<250 copies / mL). A negative result must be combined with clinical observations, patient history, and epidemiological information.  Fact Sheet for Patients:   StrictlyIdeas.no  Fact Sheet for Healthcare Providers: BankingDealers.co.za  This test is not yet approved or  cleared by the [place] FDA and has been authorized for detection and/or diagnosis of SARS-CoV-2 by FDA under an Emergency Use Authorization (EUA).  This EUA will remain in effect (meaning this test can be used) for the duration of the COVID-19 declaration under Section 564(b)(1) of the Act, 21 U.S.C. section 360bbb-3(b)(1), unless the authorization is terminated or revoked sooner.  Performed at [facility], [address] [place], [place] [address]   Urine culture     Status: Abnormal   Collection Time: 08/18/20 12:11 AM   Specimen: In/Out Cath Urine  Result Value Ref Range Status   Specimen Description   Final    IN/OUT CATH URINE Performed at [facility], [address] [place], [place] [address]    Special Requests   Final    NONE Performed at [facility], [address] [place], [place] [address]    Culture MULTIPLE SPECIES PRESENT, SUGGEST RECOLLECTION (A)  Final   Report Status 08/18/2020 FINAL  Final  Blood culture (routine single)     Status: None (Preliminary result)   Collection Time: 08/18/20 12:35 AM    Specimen: BLOOD  Result Value Ref Range Status   Specimen Description BLOOD LEFT ANTECUBITAL  Final   Special Requests   Final    BOTTLES DRAWN AEROBIC ONLY Blood Culture adequate volume   Culture   Final    NO GROWTH 3 DAYS Performed at [facility], [address], [place], [place] [address]    Report Status PENDING  Incomplete     Labs: BNP (last 3 results) No results for input(s): BNP in the last 8760 hours. Basic Metabolic Panel: Recent Labs  Lab 08/17/20 2351 08/18/20 1055 08/19/20 0621 08/20/20 0448 08/21/20 0539  NA 139 142 140 137 136  K 3.6 3.5 3.0* 3.4* 3.3*  CL 96* 100 94* 91*  91*  CO2 33* 31 33* 34* 35*  GLUCOSE 103* 160* 163* 249* 261*  BUN 16 14 10 11 14   CREATININE 0.84 0.66 0.73 0.75 0.72  CALCIUM 10.1 9.1 10.2 10.5* 10.0  MG  --   --  1.7 1.8 1.8  PHOS  --   --   --   --  3.0   Liver Function Tests: Recent Labs  Lab 08/17/20 2351 08/21/20 0539  AST 55*  --   ALT 23  --   ALKPHOS 96  --   BILITOT 0.7  --   PROT 7.4  --   ALBUMIN 3.5 3.3*   No results for input(s): LIPASE, AMYLASE in the last 168 hours. No results for input(s): AMMONIA in the last 168 hours. CBC: Recent Labs  Lab 08/17/20 2351 08/18/20 1055 08/19/20 0621 08/20/20 0448  WBC 10.7* 8.2 7.6 10.5  NEUTROABS 8.1*  --   --   --   HGB 13.2 11.3* 12.5 13.0  HCT 40.8 35.7* 37.0 40.1  MCV 88.1 89.9 86.0 87.4  PLT 192 177 194 228   Cardiac Enzymes: Recent Labs  Lab 08/17/20 2351 08/19/20 1450  CKTOTAL 597* 195   BNP: Invalid input(s): POCBNP CBG: Recent Labs  Lab 08/20/20 1132 08/20/20 1714 08/20/20 1950 08/21/20 0731 08/21/20 1152  GLUCAP 256* 269* 254* 226* 242*   D-Dimer No results for input(s): DDIMER in the last 72 hours. Hgb A1c No results for input(s): HGBA1C in the last 72 hours. Lipid Profile No results for input(s): CHOL, HDL, LDLCALC, TRIG, CHOLHDL, LDLDIRECT in the last 72 hours. Thyroid function studies No results for input(s): TSH,  T4TOTAL, T3FREE, THYROIDAB in the last 72 hours.  Invalid input(s): FREET3 Anemia work up No results for input(s): VITAMINB12, FOLATE, FERRITIN, TIBC, IRON, RETICCTPCT in the last 72 hours. Urinalysis    Component Value Date/Time   COLORURINE YELLOW (A) 08/18/2020 0011   APPEARANCEUR HAZY (A) 08/18/2020 0011   APPEARANCEUR Clear 10/22/2014 1539   LABSPEC 1.015 08/18/2020 0011   LABSPEC 1.010 10/22/2014 1539   PHURINE 6.0 08/18/2020 0011   GLUCOSEU NEGATIVE 08/18/2020 0011   GLUCOSEU 150 mg/dL 10/22/2014 1539   HGBUR SMALL (A) 08/18/2020 0011   BILIRUBINUR NEGATIVE 08/18/2020 0011   BILIRUBINUR Negative 10/22/2014 1539   KETONESUR NEGATIVE 08/18/2020 0011   PROTEINUR 100 (A) 08/18/2020 0011   UROBILINOGEN 0.2 07/03/2009 1105   NITRITE NEGATIVE 08/18/2020 0011   LEUKOCYTESUR TRACE (A) 08/18/2020 0011   LEUKOCYTESUR Negative 10/22/2014 1539   Sepsis Labs Invalid input(s): PROCALCITONIN,  WBC,  LACTICIDVEN   Time coordinating discharge: 45 minutes  SIGNED:  Mercy Riding, MD  [facility] 08/21/2020, 3:37 PM  If 7PM-7AM, please contact night-coverage [email or web address]

## 2020-08-21 NOTE — Care Management Important Message (Signed)
Important Message  Patient Details  Name: Brittney [name] MRN: [id number] Date of Birth: [birth year]   Medicare Important Message Given:  Yes     Dannette Barbara 08/21/2020, 12:38 PM

## 2020-08-23 LAB — CULTURE, BLOOD (SINGLE)
Culture: NO GROWTH
Special Requests: ADEQUATE

## 2020-09-12 NOTE — Progress Notes (Deleted)
[place]  [phone]) [phone] Fax:(336) [phone]  ID: MERRIAM BRANDNER OB: 12-Apr-1951  MR#: [id number]  DDU#:[id number]  Patient Care Team: Rusty Aus, MD as PCP - General (Internal Medicine) Lloyd Huger, MD as Consulting Physician (Hematology and Oncology)  CHIEF COMPLAINT: Iron deficiency anemia.  INTERVAL HISTORY: Patient returns to clinic today for repeat laboratory work and further evaluation.  She continues to have weakness and fatigue, but admits this has improved since receiving IV iron several months ago.  She has no neurologic complaints. She denies any recent fevers or illnesses. She has a good appetite and denies weight loss.  She denies any chest pain, shortness of breath, cough, or hemoptysis.  She has no nausea, vomiting, constipation, or diarrhea. She denies any melena or hematochezia. She has no urinary complaints.  Patient offers no further specific complaints today.  REVIEW OF SYSTEMS:   Review of Systems  Constitutional: Positive for malaise/fatigue. Negative for fever and weight loss.  Respiratory: Negative.  Negative for cough, hemoptysis and shortness of breath.   Cardiovascular: Negative.  Negative for chest pain and leg swelling.  Gastrointestinal: Negative.  Negative for abdominal pain, blood in stool and melena.  Genitourinary: Negative.  Negative for dysuria.  Musculoskeletal: Negative.  Negative for back pain.  Skin: Negative.  Negative for rash.  Neurological: Positive for weakness. Negative for sensory change, focal weakness and headaches.  Psychiatric/Behavioral: Negative.  The patient is not nervous/anxious.     As per HPI. Otherwise, a complete review of systems is negative.  PAST MEDICAL HISTORY: Past Medical History:  Diagnosis Date  . Abdominal pain, left upper quadrant 12/11/2012  . Arthritis   . Asthma   . Broken leg 2014   fell twice and broke same leg. had a bone stimulator for first break.  . Cancer  ([employer]) 2007   melanoma, left ear and back of left leg, removed from back  . Chronic kidney disease, stage 2 (mild)   . Chronic lower back pain   . Collagen vascular disease ([place])   . Coronary artery disease   . Diabetes mellitus without complication ([place])   . Diabetic nephropathy associated with secondary diabetes mellitus ([place])   . Flushing 12/11/2012  . Gastroparesis   . Gross hematuria 12/11/2012  . Hepatic cirrhosis ([place])   . History of kidney stones   . Hyperlipidemia   . Hypertension   . Hypothyroidism   . Hypothyroidism   . IBS (irritable bowel syndrome)   . Kidney stone 12/11/2012  . Lower extremity edema   . Morbid obesity with BMI of 40.0-44.9, adult ([place])   . Myocardial infarction ([place]) 09/2013  . Nausea without vomiting 12/11/2012  . Nephrolithiasis   . Nephrolithiasis 04/26/2014  . Nonproliferative retinopathy due to secondary diabetes ([place])   . Numbness and tingling of right leg   . Peripheral vascular disease ([place])   . Renal colic 23/76/2831  . Renal insufficiency   . Sciatica   . Sciatica 12/11/2012  . Skin cancer 08/2018  . Sleep apnea    sleep study coming in november  . Unilateral small kidney without contralateral hypertrophy     PAST SURGICAL HISTORY: Past Surgical History:  Procedure Laterality Date  . ABDOMINAL HYSTERECTOMY    . BACK SURGERY  04/1999   L-4-5 LAMINECTOMY. metal in lower back and neck  . CESAREAN SECTION  1980  . CHOLECYSTECTOMY    . COLONOSCOPY  05/04/2001  . COLONOSCOPY WITH PROPOFOL N/A 09/20/2016   Procedure: COLONOSCOPY  WITH PROPOFOL;  Surgeon: Lollie Sails, MD;  Location: [facility] ENDOSCOPY;  Service: Endoscopy;  Laterality: N/A;  . DILATION AND CURETTAGE OF UTERUS    . ESOPHAGOGASTRODUODENOSCOPY  02/08/2014  . ESOPHAGOGASTRODUODENOSCOPY (EGD) WITH PROPOFOL N/A 09/20/2016   Procedure: ESOPHAGOGASTRODUODENOSCOPY (EGD) WITH PROPOFOL;  Surgeon: Lollie Sails, MD;  Location: [facility] ENDOSCOPY;  Service: Endoscopy;  Laterality:  N/A;  . ESOPHAGOGASTRODUODENOSCOPY (EGD) WITH PROPOFOL N/A 05/23/2018   Procedure: ESOPHAGOGASTRODUODENOSCOPY (EGD) WITH PROPOFOL;  Surgeon: Lollie Sails, MD;  Location: [facility] ENDOSCOPY;  Service: Endoscopy;  Laterality: N/A;  . ESOPHAGOGASTRODUODENOSCOPY (EGD) WITH PROPOFOL N/A 07/18/2020   Procedure: ESOPHAGOGASTRODUODENOSCOPY (EGD) WITH PROPOFOL;  Surgeon: Lesly Rubenstein, MD;  Location: [facility] ENDOSCOPY;  Service: Gastroenterology;  Laterality: N/A;  . EYE SURGERY  1986   FOR MELANOMA  . HERNIA REPAIR    . JOINT REPLACEMENT Left 09/24/2013   TOTAL KNEE  . MOHS SURGERY  11/2016   left side of nose  . PULSE GENERATOR IMPLANT N/A 11/02/2017   Procedure: UNILATERAL PULSE GENERATOR IMPLANT;  Surgeon: Meade Maw, MD;  Location: [facility] ORS;  Service: Neurosurgery;  Laterality: N/A;  . TONSILLECTOMY     WITH ADENOIDECTOMY    FAMILY HISTORY: Family History  Problem Relation Age of Onset  . Heart disease Mother   . Cancer Mother   . Asthma Mother   . Diabetes Father   . Kidney disease Father   . Hypertension Father   . Breast cancer Neg Hx     ADVANCED DIRECTIVES (Y/N):  N  HEALTH MAINTENANCE: Social History   Tobacco Use  . Smoking status: Never Smoker  . Smokeless tobacco: Never Used  Vaping Use  . Vaping Use: Never used  Substance Use Topics  . Alcohol use: No  . Drug use: Never     Colonoscopy:  PAP:  Bone density:  Lipid panel:  Allergies  Allergen Reactions  . Eggs Or Egg-Derived Products Nausea And Vomiting    Only has reaction to flu vaccine due to eggs, no reaction to egg food products  . Shellfish Allergy Nausea And Vomiting, Diarrhea and Nausea Only    Non stop sickness once ingests shellfish. Betadine is okay  . Amlodipine Cough  . Codeine Nausea And Vomiting and Nausea Only    states she can take codeine cough syrup without problems states she can take codeine cough syrup without problems  . Imdur [Isosorbide Dinitrate] Other (See Comments)  and Cough    Headache  . Lyrica [Pregabalin] Swelling and Other (See Comments)    "FLU-LIKE" SYMPTOMS  . Monosodium Glutamate Other (See Comments) and Cough    Headache  . Tape Other (See Comments) and Nausea And Vomiting    can use paper tape Redness    Current Outpatient Medications  Medication Sig Dispense Refill  . albuterol (VENTOLIN HFA) 108 (90 Base) MCG/ACT inhaler Inhale 2 puffs into the lungs 3 (three) times daily as needed for wheezing or shortness of breath.     . ALPRAZolam (XANAX) 0.5 MG tablet Take 0.5 mg by mouth 4 (four) times daily. Take    . budesonide (PULMICORT) 0.5 MG/2ML nebulizer solution Take 2 mLs by nebulization daily.    . butalbital-acetaminophen-caffeine (FIORICET, ESGIC) 50-325-40 MG tablet Take 1 tablet by mouth 2 (two) times daily as needed for migraine.     . carvedilol (COREG) 25 MG tablet Take 50 mg by mouth 2 (two) times daily with a meal.     . cloNIDine (CATAPRES - DOSED IN MG/24 HR)  0.3 mg/24hr patch Place 0.3 mg onto the skin once a week.     . cyclobenzaprine (FLEXERIL) 10 MG tablet Take 20 mg by mouth at bedtime.    Marland Kitchen doxazosin (CARDURA) 4 MG tablet Take 4 mg by mouth every evening.     . escitalopram (LEXAPRO) 10 MG tablet Take 10 mg by mouth daily.     . fluticasone (FLONASE) 50 MCG/ACT nasal spray Place 2 sprays into both nostrils as needed.     . furosemide (LASIX) 40 MG tablet Take 40 mg by mouth daily.    Marland Kitchen gabapentin (NEURONTIN) 600 MG tablet Take 1 tablet (600mg ) by mouth twice daily and take 3 tablets (1800mg ) by mouth at bedtime    . hydrALAZINE (APRESOLINE) 50 MG tablet Take 50 mg by mouth 3 (three) times daily.     Marland Kitchen ibuprofen (ADVIL,MOTRIN) 800 MG tablet Take 800 mg by mouth 3 (three) times daily as needed for mild pain or moderate pain.     . Insulin Human (INSULIN PUMP) SOLN Inject into the skin.    Marland Kitchen insulin regular human CONCENTRATED (HUMULIN R) 500 UNIT/ML kwikpen Inject 40-80 Units into the skin See admin instructions. Uses  insulin pump    . ipratropium-albuterol (DUONEB) 0.5-2.5 (3) MG/3ML SOLN Take 3 mLs by nebulization every 6 (six) hours.    Marland Kitchen levothyroxine (SYNTHROID) 200 MCG tablet Take 200 mcg by mouth daily before breakfast.    . LINZESS 290 MCG CAPS capsule Take 290 mcg by mouth daily.    . metoCLOPramide (REGLAN) 10 MG tablet Take 10 mg by mouth 3 (three) times daily.    . metolazone (ZAROXOLYN) 5 MG tablet Take 5 mg by mouth 2 (two) times a week.    . ondansetron (ZOFRAN) 4 MG tablet Take 4 mg by mouth every 12 (twelve) hours as needed for nausea or vomiting.     . pantoprazole (PROTONIX) 40 MG tablet Take 40 mg by mouth 2 (two) times daily.     . potassium chloride (K-DUR) 10 MEQ tablet Take 10 mEq by mouth daily.    . pramipexole (MIRAPEX) 0.5 MG tablet Take 0.5 mg by mouth 2 (two) times daily.     . rosuvastatin (CRESTOR) 20 MG tablet Take 20 mg by mouth daily. (Patient not taking: Reported on 06/24/2020)    . telmisartan (MICARDIS) 40 MG tablet Take 40 mg by mouth daily.    . traMADol (ULTRAM) 50 MG tablet Take 100 mg by mouth at bedtime as needed.   3  . traZODone (DESYREL) 150 MG tablet Take 150 mg by mouth at bedtime.     Marland Kitchen zolpidem (AMBIEN) 10 MG tablet Take 0.5 tablets (5 mg total) by mouth at bedtime. 30 tablet 0   No current facility-administered medications for this visit.    OBJECTIVE: There were no vitals filed for this visit.   There is no height or weight on file to calculate BMI.    ECOG FS:2 - Symptomatic, <50% confined to bed  General: Well-developed, well-nourished, no acute distress.  Sitting in a wheelchair. Eyes: Pink conjunctiva, anicteric sclera. HEENT: Normocephalic, moist mucous membranes. Lungs: No audible wheezing or coughing. Heart: Regular rate and rhythm. Abdomen: Soft, nontender, no obvious distention. Musculoskeletal: No edema, cyanosis, or clubbing. Neuro: Alert, answering all questions appropriately. Cranial nerves grossly intact. Skin: No rashes or petechiae  noted. Psych: Normal affect.   LAB RESULTS:  Lab Results  Component Value Date   NA 136 08/21/2020   K 3.3 (L) 08/21/2020  CL 91 (L) 08/21/2020   CO2 35 (H) 08/21/2020   GLUCOSE 261 (H) 08/21/2020   BUN 14 08/21/2020   CREATININE 0.72 08/21/2020   CALCIUM 10.0 08/21/2020   PROT 7.4 08/17/2020   ALBUMIN 3.3 (L) 08/21/2020   AST 55 (H) 08/17/2020   ALT 23 08/17/2020   ALKPHOS 96 08/17/2020   BILITOT 0.7 08/17/2020   GFRNONAA >60 08/21/2020   GFRAA >60 08/21/2020    Lab Results  Component Value Date   WBC 10.5 08/20/2020   NEUTROABS 8.1 (H) 08/17/2020   HGB 13.0 08/20/2020   HCT 40.1 08/20/2020   MCV 87.4 08/20/2020   PLT 228 08/20/2020   Lab Results  Component Value Date   IRON 52 04/28/2020   TIBC 274 04/28/2020   IRONPCTSAT 19 04/28/2020   Lab Results  Component Value Date   FERRITIN 85 04/28/2020     STUDIES: CT Head Wo Contrast  Result Date: 08/18/2020 CLINICAL DATA:  Syncope EXAM: CT HEAD WITHOUT CONTRAST TECHNIQUE: Contiguous axial images were obtained from the base of the skull through the vertex without intravenous contrast. COMPARISON:  None. FINDINGS: Brain: There is no mass, hemorrhage or extra-axial collection. The size and configuration of the ventricles and extra-axial CSF spaces are normal. The brain parenchyma is normal, without acute infarction. 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 Vascular: No abnormal hyperdensity of the major intracranial arteries or dural venous sinuses. No intracranial atherosclerosis. Skull: The visualized skull base, calvarium and extracranial soft tissues are normal. Sinuses/Orbits: No fluid levels or advanced mucosal thickening of the visualized paranasal sinuses. No mastoid or middle ear effusion. The orbits are normal. IMPRESSION: 1. No acute intracranial abnormality. 2. Unchanged appearance of enlarged left Sylvian fissure, likely an  arachnoid cyst. 3. Unchanged left cerebellar infarct. Electronically Signed   By: Ulyses Jarred M.D.   On: 08/18/2020 01:18   DG Chest Port 1 View  Result Date: 08/18/2020 CLINICAL DATA:  Sepsis EXAM: PORTABLE CHEST 1 VIEW COMPARISON:  07/30/2019 FINDINGS: The heart size and mediastinal contours are within normal limits. Both lungs are clear. The visualized skeletal structures are unremarkable. IMPRESSION: No active disease. Electronically Signed   By: Ulyses Jarred M.D.   On: 08/18/2020 00:44   ECHOCARDIOGRAM COMPLETE  Result Date: 08/19/2020    ECHOCARDIOGRAM REPORT   Patient Name:   Brittney [name] Date of Exam: 08/18/2020 Medical Rec #:  [id number]        Height:       66.0 in Accession #:    [id number]       Weight:       319.7 lb Date of Birth:  1951/11/01         BSA:          2.441 m Patient Age:    7 years         BP:           135/61 mmHg Patient Gender: F                HR:           72 bpm. Exam Location:  [facility] Procedure: 2D Echo, Color Doppler and Cardiac Doppler Indications:     Syncope 780.2 / R55  History:         Patient has no prior history of Echocardiogram examinations.                  CAD; Risk  Factors:Hypertension. MI.  Sonographer:     Alyse Low Roar Referring Phys:  [id number] Harold Hedge Diagnosing Phys: Nelva Bush MD  Sonographer Comments: Technically difficult study due to poor echo windows. IMPRESSIONS  1. Left ventricular ejection fraction, by estimation, is >55%. The left ventricle has normal function. Left ventricular endocardial border not optimally defined to evaluate regional wall motion. There is moderate left ventricular hypertrophy. Left ventricular diastolic parameters are consistent with Grade I diastolic dysfunction (impaired relaxation).  2. Right ventricular systolic function is normal. The right ventricular size is not well visualized. Tricuspid regurgitation signal is inadequate for assessing PA pressure.  3. The mitral valve is grossly normal. No evidence of  mitral valve regurgitation. No evidence of mitral stenosis.  4. The aortic valve was not well visualized. Aortic valve regurgitation is not visualized. No aortic stenosis is present. FINDINGS  Left Ventricle: Left ventricular ejection fraction, by estimation, is >55%. The left ventricle has normal function. Left ventricular endocardial border not optimally defined to evaluate regional wall motion. The left ventricular internal cavity size was  normal in size. There is moderate left ventricular hypertrophy. Left ventricular diastolic parameters are consistent with Grade I diastolic dysfunction (impaired relaxation). Right Ventricle: The right ventricular size is not well visualized. No increase in right ventricular wall thickness. Right ventricular systolic function is normal. Tricuspid regurgitation signal is inadequate for assessing PA pressure. Left Atrium: Left atrial size was normal in size. Right Atrium: Right atrial size was normal in size. Pericardium: The pericardium was not well visualized. Presence of pericardial fat pad. Mitral Valve: The mitral valve is grossly normal. No evidence of mitral valve regurgitation. No evidence of mitral valve stenosis. Tricuspid Valve: The tricuspid valve is not well visualized. Tricuspid valve regurgitation is not demonstrated. Aortic Valve: The aortic valve was not well visualized. Aortic valve regurgitation is not visualized. No aortic stenosis is present. Aortic valve peak gradient measures 12.7 mmHg. Pulmonic Valve: The pulmonic valve was not well visualized. Pulmonic valve regurgitation is not visualized. No evidence of pulmonic stenosis. Aorta: The aortic root was not well visualized. Pulmonary Artery: The pulmonary artery is not well seen. Venous: The inferior vena cava was not well visualized. IAS/Shunts: The interatrial septum was not well visualized.  LEFT VENTRICLE PLAX 2D LVIDd:         5.05 cm  Diastology LVIDs:         3.50 cm  LV e' lateral:   8.92 cm/s LV  PW:         1.22 cm  LV E/e' lateral: 9.1 LV IVS:        1.34 cm  LV e' medial:    7.62 cm/s LVOT diam:     1.90 cm  LV E/e' medial:  10.7 LVOT Area:     2.84 cm  RIGHT VENTRICLE RV Mid diam:    3.31 cm RV S prime:     14.70 cm/s LEFT ATRIUM             Index       RIGHT ATRIUM           Index LA diam:        4.00 cm 1.64 cm/m  RA Area:     17.30 cm LA Vol (A2C):   57.1 ml 23.39 ml/m RA Volume:   45.50 ml  18.64 ml/m LA Vol (A4C):   43.2 ml 17.70 ml/m LA Biplane Vol: 49.8 ml 20.40 ml/m  AORTIC VALVE  PULMONIC VALVE AV Area (Vmax): 1.83 cm    PV Vmax:        1.28 m/s AV Vmax:        178.00 cm/s PV Peak grad:   6.6 mmHg AV Peak Grad:   12.7 mmHg   RVOT Peak grad: 4 mmHg LVOT Vmax:      115.00 cm/s  MITRAL VALVE MV Area (PHT): 6.12 cm    SHUNTS MV Decel Time: 124 msec    Systemic Diam: 1.90 cm MV E velocity: 81.40 cm/s MV A velocity: 97.30 cm/s MV E/A ratio:  0.84 MV A Prime:    9.7 cm/s Nelva Bush MD Electronically signed by Nelva Bush MD Signature Date/Time: 08/19/2020/3:27:56 PM    Final    CT ANGIO HEAD CODE STROKE  Result Date: 08/18/2020 CLINICAL DATA:  Syncope, left neck pain EXAM: CT ANGIOGRAPHY HEAD AND NECK TECHNIQUE: Multidetector CT imaging of the head and neck was performed using the standard protocol during bolus administration of intravenous contrast. Multiplanar CT image reconstructions and MIPs were obtained to evaluate the vascular anatomy. Carotid stenosis measurements (when applicable) are obtained utilizing NASCET criteria, using the distal internal carotid diameter as the denominator. CONTRAST:  115mL OMNIPAQUE IOHEXOL 350 MG/ML SOLN COMPARISON:  CTA head 07/22/2020, CT head earlier same day FINDINGS: CT HEAD Brain: There is no acute intracranial hemorrhage, mass effect, or edema. Gray-white differentiation is preserved. There is no extra-axial fluid collection. Chronic left cerebellar infarct. Ventricles and sulci are stable in size and configuration. Vascular:  There is atherosclerotic calcification at the skull base. Skull: Calvarium is unremarkable. Sinuses/Orbits: No acute finding. Other: None. Review of the MIP images confirms the above findings CTA NECK Aortic arch: Great vessel origins are patent. Right carotid system: Patent. Plaque at the ICA origin causes minimal stenosis. Partial retropharyngeal course Left carotid system: Patent. No measurable stenosis at the ICA origin. Partial retropharyngeal course. Vertebral arteries: Patent. Origins are partially obscured by artifact. No measurable stenosis or evidence of dissection. Skeleton: Prior anterior fusion at C5-C6. Degenerative changes of the cervical spine. Other neck: No mass or adenopathy. Upper chest: No apical lung mass. Review of the MIP images confirms the above findings CTA HEAD Anterior circulation: Intracranial internal carotid arteries are patent with mild calcified plaque. Anterior and middle cerebral arteries are patent. 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 Posterior circulation: Intracranial vertebral arteries, basilar artery, and posterior cerebral arteries are patent. There is fetal or near fetal origin of the right posterior cerebral artery. Left P2 PCA atherosclerotic irregularity and up to marked stenosis. Venous sinuses: Patent as allowed by contrast bolus timing. Review of the MIP images confirms the above findings IMPRESSION: No acute intracranial abnormality. No large vessel occlusion, hemodynamically significant stenosis, or evidence of dissection in the neck. No proximal intracranial vessel occlusion. 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 Up to marked stenosis of the left P2 PCA. Electronically Signed   By: Macy Mis M.D.   On: 08/18/2020 16:02   CT ANGIO NECK CODE STROKE  Result Date: 08/18/2020 CLINICAL DATA:  Syncope, left neck pain EXAM: CT ANGIOGRAPHY HEAD AND NECK TECHNIQUE: Multidetector CT imaging of the head and neck was  performed using the standard protocol during bolus administration of intravenous contrast. Multiplanar CT image reconstructions and MIPs were obtained to evaluate the vascular anatomy. Carotid stenosis measurements (when applicable) are obtained utilizing NASCET criteria, using the distal internal carotid diameter as the denominator. CONTRAST:  150mL OMNIPAQUE IOHEXOL 350  MG/ML SOLN COMPARISON:  CTA head 07/22/2020, CT head earlier same day FINDINGS: CT HEAD Brain: There is no acute intracranial hemorrhage, mass effect, or edema. Gray-white differentiation is preserved. There is no extra-axial fluid collection. Chronic left cerebellar infarct. Ventricles and sulci are stable in size and configuration. Vascular: There is atherosclerotic calcification at the skull base. Skull: Calvarium is unremarkable. Sinuses/Orbits: No acute finding. Other: None. Review of the MIP images confirms the above findings CTA NECK Aortic arch: Great vessel origins are patent. Right carotid system: Patent. Plaque at the ICA origin causes minimal stenosis. Partial retropharyngeal course Left carotid system: Patent. No measurable stenosis at the ICA origin. Partial retropharyngeal course. Vertebral arteries: Patent. Origins are partially obscured by artifact. No measurable stenosis or evidence of dissection. Skeleton: Prior anterior fusion at C5-C6. Degenerative changes of the cervical spine. Other neck: No mass or adenopathy. Upper chest: No apical lung mass. Review of the MIP images confirms the above findings CTA HEAD Anterior circulation: Intracranial internal carotid arteries are patent with mild calcified plaque. Anterior and middle cerebral arteries are patent. 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 Posterior circulation: Intracranial vertebral arteries, basilar artery, and posterior cerebral arteries are patent. There is fetal or near fetal origin of the right posterior cerebral artery. Left P2 PCA atherosclerotic  irregularity and up to marked stenosis. Venous sinuses: Patent as allowed by contrast bolus timing. Review of the MIP images confirms the above findings IMPRESSION: No acute intracranial abnormality. No large vessel occlusion, hemodynamically significant stenosis, or evidence of dissection in the neck. No proximal intracranial vessel occlusion. 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 Up to marked stenosis of the left P2 PCA. Electronically Signed   By: Macy Mis M.D.   On: 08/18/2020 16:02    ASSESSMENT: Iron deficiency anemia.  PLAN:    1. Iron deficiency anemia:  Colonoscopy and EGD completed on September 20, 2016 were essentially normal other than some mild gastritis.  Patient's hemoglobin has significantly improved and is now within normal limits at 12.5.  Iron stores are pending at time of dictation.  Previously, the remainder of her laboratory work was either negative or within normal limits.  She does not require additional IV Feraheme today.  Patient last received treatment on March 10, 2020.  Return to clinic in 3 months with repeat laboratory work, further evaluation, and consideration of additional treatment if needed.   2.  Back pain: Chronic and unchanged.  Patient has permanent spinal stimulator in place. 3.  Diabetes: Patient now has insulin pump. 4.  Persistently cold/hair thinning: Patient's Synthroid is being adjusted by primary care.    Patient expressed understanding and was in agreement with this plan. She also understands that She can call clinic at any time with any questions, concerns, or complaints.    Lloyd Huger, MD   09/12/2020 7:05 AM

## 2020-09-15 ENCOUNTER — Inpatient Hospital Stay: Payer: Medicare Other

## 2020-09-15 ENCOUNTER — Inpatient Hospital Stay: Payer: Medicare Other | Admitting: Oncology

## 2020-09-22 ENCOUNTER — Encounter: Payer: Medicare Other | Admitting: Student in an Organized Health Care Education/Training Program

## 2020-09-23 ENCOUNTER — Ambulatory Visit
Payer: Medicare Other | Attending: Student in an Organized Health Care Education/Training Program | Admitting: Student in an Organized Health Care Education/Training Program

## 2020-09-23 ENCOUNTER — Other Ambulatory Visit: Payer: Self-pay

## 2020-09-23 ENCOUNTER — Encounter: Payer: Self-pay | Admitting: Student in an Organized Health Care Education/Training Program

## 2020-09-23 VITALS — BP 145/48 | HR 63 | Temp 97.3°F | Resp 18 | Ht 66.0 in | Wt 316.0 lb

## 2020-09-23 DIAGNOSIS — M48061 Spinal stenosis, lumbar region without neurogenic claudication: Secondary | ICD-10-CM | POA: Insufficient documentation

## 2020-09-23 DIAGNOSIS — M1711 Unilateral primary osteoarthritis, right knee: Secondary | ICD-10-CM | POA: Diagnosis present

## 2020-09-23 DIAGNOSIS — G894 Chronic pain syndrome: Secondary | ICD-10-CM | POA: Insufficient documentation

## 2020-09-23 DIAGNOSIS — M47816 Spondylosis without myelopathy or radiculopathy, lumbar region: Secondary | ICD-10-CM | POA: Diagnosis not present

## 2020-09-23 DIAGNOSIS — M1712 Unilateral primary osteoarthritis, left knee: Secondary | ICD-10-CM | POA: Diagnosis not present

## 2020-09-23 DIAGNOSIS — M5416 Radiculopathy, lumbar region: Secondary | ICD-10-CM | POA: Diagnosis present

## 2020-09-23 DIAGNOSIS — Z9689 Presence of other specified functional implants: Secondary | ICD-10-CM | POA: Insufficient documentation

## 2020-09-23 DIAGNOSIS — M961 Postlaminectomy syndrome, not elsewhere classified: Secondary | ICD-10-CM

## 2020-09-23 DIAGNOSIS — Z9889 Other specified postprocedural states: Secondary | ICD-10-CM | POA: Insufficient documentation

## 2020-09-23 MED ORDER — OXYCODONE-ACETAMINOPHEN 10-325 MG PO TABS
1.0000 | ORAL_TABLET | Freq: Two times a day (BID) | ORAL | 0 refills | Status: AC | PRN
Start: 2020-10-10 — End: 2020-12-09

## 2020-09-23 NOTE — Progress Notes (Signed)
Nursing Pain Medication Assessment:  Safety precautions to be maintained throughout the outpatient stay will include: orient to surroundings, keep bed in low position, maintain call bell within reach at all times, provide assistance with transfer out of bed and ambulation.  Medication Inspection Compliance: Pill count conducted under aseptic conditions, in front of the patient. Neither the pills nor the bottle was removed from the patient's sight at any time. Once count was completed pills were immediately returned to the patient in their original bottle.  Medication: Oxycodone/APAP Pill/Patch Count: 34 of 60 pills remain Pill/Patch Appearance: Markings consistent with prescribed medication Bottle Appearance: Standard pharmacy container. Clearly labeled. Filled Date: 08 /27 / 2021 Last Medication intake:  Today

## 2020-09-23 NOTE — Progress Notes (Signed)
PROVIDER NOTE: Information contained herein reflects review and annotations entered in association with encounter. Interpretation of such information and data should be left to medically-trained personnel. Information provided to patient can be located elsewhere in the medical record under "Patient Instructions". Document created using STT-dictation technology, any transcriptional errors that may result from process are unintentional.    Patient: Brittney [name]  Service Category: E/M  Provider: [name], MD  DOB: Mar 07, 1951  DOS: 09/23/2020  Specialty: Interventional Pain Management  MRN: [id number]  Setting: Ambulatory outpatient  PCP: Rusty Aus, MD  Type: Established Patient    Referring Provider: Rusty Aus, MD  Location: Office  Delivery: Face-to-face     HPI  Reason for encounter: Ms. Brittney [name], a 69 y.o. year old female, is here today for evaluation and management of her Chronic pain syndrome [G89.4]. Ms. Lauricella primary complain today is Leg Pain (right), Knee Pain, and Back Pain Last encounter: Practice (06/24/2020). My last encounter with her was on 06/24/2020. Pertinent problems: Ms. 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 Pain Assessment: Severity of Chronic pain is reported as a 7 /10. Location: Knee Right/radiates from low back to leg. Onset: More than a month ago. Quality: Throbbing. Timing: Constant. Modifying factor(s): pain medication, sitting down. Vitals:  height is '5\' 6"'  (1.676 m) and weight is 316 lb (143.3 kg) (abnormal). Her temperature is 97.3 F (36.3 C) (abnormal). Her blood pressure is 145/48 (abnormal) and her pulse is 63. Her respiration is 18 and oxygen saturation is 95%.   Patient follows up today for medication management.  Since her last visit, patient was admitted to the hospital related to a fall that she had.  She was found on the ground by her daughter at her house.  She was taken by EMS to the ED.  She was outside of the 48-hour window for thrombolytic therapy.  She does not have any residual sensory or motor deficits as a result of her stroke.  She is here for a refill of her Percocet.  She continues tramadol as prescribed by Dr. Sabra Heck along with Ambien and Xanax for sleep and anxiety respectively.  Patient is accompanied today by her daughter.  I had extensive discussion with the patient and her daughter regarding polypharmacy and my concerns regarding her medication regimen.  Patient states that she is utilizing less oxycodone and is only taking 1 tablet a day as needed for breakthrough pain and very seldomly will she take 2 tablets a day.  She continues tramadol as prescribed.  I recommend reducing her Percocet intake to accurately reflect her change in frequency which is 1 tablet daily as needed for breakthrough pain, quantity 30/month.  I  will prescribe her 60 tablets as below which I am hoping will last her 2 months.  I have also encouraged the patient to talk to Dr. Sabra Heck  about reducing her Xanax quantity.  I discussed the importance of finding an alternative to Xanax if possible and the risk of respiratory issues and adverse side effects with concomitant benzodiazepine and opioid therapy.  Given that Dr. Sabra Heck is managing the patient's tramadol, alprazolam, Ambien, it may be worthwhile to see if Dr. Sabra Heck can also manage low-dose Percocet.  This is the only thing that I am managing for the patient.  She has a spinal cord stimulator that is present which is providing mild relief at best.  We have tried many times to optimize programming but have limited benefit.  Patient is fairly limited in her ability to perform ADLs and function given her weight and complex medical history as well as significant pain burden.  Pharmacotherapy Assessment   Analgesic: We will reduce Percocet from 5 mg twice daily as needed to 5 mg daily as needed.  Instructed patient to only take this if she has breakthrough pain.   Monitoring: [place] PMP: PDMP reviewed during this encounter.       Pharmacotherapy: No side-effects or adverse reactions reported. Compliance: No problems identified. Effectiveness: Clinically acceptable.  Dewayne Shorter, RN  09/23/2020  1:24 PM  Signed Nursing Pain Medication Assessment:  Safety precautions to be maintained throughout the outpatient stay will include: orient to surroundings, keep bed in low position, maintain call bell within reach at all times, provide assistance with transfer out of bed and ambulation.  Medication Inspection Compliance: Pill count conducted under aseptic conditions, in front of the patient. Neither the pills nor the bottle was removed from the patient's sight at any time. Once count was completed pills were immediately returned to the patient in their original bottle.  Medication: Oxycodone/APAP Pill/Patch Count: 34 of 60 pills remain Pill/Patch Appearance: Markings consistent with prescribed medication Bottle Appearance: Standard  pharmacy container. Clearly labeled. Filled Date: 08 /27 / 2021 Last Medication intake:  Today    UDS:  Summary  Date Value Ref Range Status  01/03/2019 FINAL  Final    Comment:    ==================================================================== TOXASSURE SELECT 13 (MW) ==================================================================== Test                             Result       Flag       Units Drug Present and Declared for Prescription Verification   Alprazolam                     165          EXPECTED   ng/mg creat   Alpha-hydroxyalprazolam        412          EXPECTED   ng/mg creat    Source of alprazolam is a scheduled prescription medication.    Alpha-hydroxyalprazolam is an expected metabolite of alprazolam.   Oxycodone                      812          EXPECTED   ng/mg creat   Oxymorphone                    932          EXPECTED   ng/mg creat  Noroxycodone                   1294         EXPECTED   ng/mg creat   Noroxymorphone                 332          EXPECTED   ng/mg creat    Sources of oxycodone are scheduled prescription medications.    Oxymorphone, noroxycodone, and noroxymorphone are expected    metabolites of oxycodone. Oxymorphone is also available as a    scheduled prescription medication.   Tramadol                       2565         EXPECTED   ng/mg creat   O-Desmethyltramadol            7935         EXPECTED   ng/mg creat   N-Desmethyltramadol            2141         EXPECTED   ng/mg creat    Source of tramadol is a prescription medication.    O-desmethyltramadol and N-desmethyltramadol are expected    metabolites of tramadol.   Butalbital                     PRESENT      EXPECTED ==================================================================== Test                      Result    Flag   Units      Ref Range   Creatinine              34               mg/dL      >=20 ==================================================================== Declared  Medications:  The flagging and interpretation on this report are based on the  following declared medications.  Unexpected results may arise from  inaccuracies in the declared medications.  **Note: The testing scope of this panel includes these medications:  Alprazolam  Butalbital (Butalbital/APAP/Caffeine)  Oxycodone (Percocet)  Tramadol (Ultram)  **Note: The testing scope of this panel does not include following  reported medications:  Acetaminophen (Butalbital/APAP/Caffeine)  Acetaminophen (Percocet)  Albuterol  Bumetanide  Caffeine (Butalbital/APAP/Caffeine)  Candesartan  Carvedilol  Clonidine  Cyclobenzaprine (Flexeril)  Doxazosin (Cardura)  Escitalopram (Lexapro)  Fluticasone (Flonase)  Fluticasone (Flovent)  Gabapentin (Neurontin)  Hydralazine (Apresoline)  Ibuprofen (Advil)  Insulin (Humulin)  Insulin (Levemir)  Lactulose  Levothyroxine  Lubiprostone (Amitiza)  Metoclopramide (Reglan)  Pantoprazole (Protonix)  Potassium  Pramipexole (Mirapex)  Sucralfate (Carafate)  Topical Diclofenac (Voltaren)  Trazodone (Desyrel)  Zolpidem (Ambien) ==================================================================== For clinical consultation, please call [phone]. ====================================================================      ROS  Constitutional: Denies any fever or chills Gastrointestinal: No reported hemesis, hematochezia, vomiting, or acute GI distress Musculoskeletal: Low back, bilateral leg pain Neurological: Lower extremity paresthesias  Medication Review  ALPRAZolam, albuterol, budesonide, butalbital-acetaminophen-caffeine, carvedilol, cloNIDine, cyclobenzaprine, doxazosin, escitalopram, fluticasone, furosemide, gabapentin, hydrALAZINE, ibuprofen, insulin pump, insulin regular human CONCENTRATED, ipratropium-albuterol, levothyroxine, linaclotide, metoCLOPramide, metolazone, ondansetron, oxyCODONE-acetaminophen, pantoprazole, potassium chloride,  pramipexole, rosuvastatin, telmisartan, traMADol, traZODone, and zolpidem  History Review  Allergy: Ms. Frohlich is allergic to eggs or egg-derived products, shellfish allergy, amlodipine, codeine, imdur [isosorbide dinitrate], lyrica [pregabalin], monosodium glutamate, and tape. Drug: Ms. Blunck  reports no history of drug use. Alcohol:  reports no history of alcohol use. Tobacco:  reports that she has never smoked. She has never used smokeless tobacco. Social: Ms. Belflower  reports that she has never smoked. She has never used smokeless tobacco. She reports that she does not drink alcohol and does not use drugs. Medical:  has a past medical history of Abdominal pain, left upper quadrant (12/11/2012), Arthritis, Asthma, Broken leg (2014), Cancer ([place]) (2007), Chronic kidney disease, stage 2 (mild), Chronic lower back pain, Collagen vascular disease ([place]), Coronary artery disease, Diabetes mellitus without complication ([place]), Diabetic nephropathy associated with secondary diabetes mellitus ([place]), [place] (12/11/2012), Gastroparesis, Gross hematuria (12/11/2012), Hepatic cirrhosis ([place]), History of kidney stones, Hyperlipidemia, Hypertension, Hypothyroidism, Hypothyroidism, IBS (irritable bowel syndrome), Kidney stone (12/11/2012), Lower extremity edema, Morbid obesity with BMI of 40.0-44.9, adult ([place]), Myocardial infarction ([place]) (09/2013), Nausea without vomiting (12/11/2012), Nephrolithiasis, Nephrolithiasis (04/26/2014), Nonproliferative retinopathy due to secondary diabetes ([place]), Numbness and tingling of right leg, Peripheral vascular disease ([place]), Renal colic (98/33/8250), Renal insufficiency, Sciatica, Sciatica (12/11/2012), Skin cancer (08/2018), Sleep apnea, Stroke ([place]), and Unilateral small kidney without contralateral hypertrophy. Surgical: Ms. Ringle  has a past surgical history that includes Cesarean section (1980); Dilation and curettage of uterus; Eye surgery (1986); Abdominal hysterectomy;  Tonsillectomy; Esophagogastroduodenoscopy (02/08/2014); Colonoscopy (05/04/2001); Esophagogastroduodenoscopy (egd) with propofol (N/A, 09/20/2016); Colonoscopy with propofol (N/A, 09/20/2016); Mohs surgery (11/2016); Cholecystectomy; Hernia repair; Joint replacement (Left, 09/24/2013); Back surgery (04/1999); Pulse generator implant (N/A, 11/02/2017); Esophagogastroduodenoscopy (egd) with propofol (N/A, 05/23/2018); and Esophagogastroduodenoscopy (egd) with propofol (N/A, 07/18/2020). Family: family history includes Asthma in her mother; Cancer in her mother; Diabetes in her father; Heart disease in her mother; Hypertension in her father; Kidney disease in her father.  Laboratory Chemistry Profile   Renal Lab Results  Component Value Date   BUN 14 08/21/2020   CREATININE 0.72 08/21/2020   GFRAA >60 08/21/2020   GFRNONAA >60 08/21/2020     Hepatic Lab Results  Component Value Date   AST 55 (H) 08/17/2020   ALT 23 08/17/2020   ALBUMIN 3.3 (L) 08/21/2020   ALKPHOS 96 08/17/2020   LIPASE 114 10/22/2014     Electrolytes Lab Results  Component Value Date   NA 136 08/21/2020   K 3.3 (L) 08/21/2020   CL 91 (L) 08/21/2020   CALCIUM 10.0 08/21/2020   MG 1.8 08/21/2020   PHOS 3.0 08/21/2020     Bone Lab Results  Component Value Date   25OHVITD1 26 (L) 12/07/2016   25OHVITD2 1.2 12/07/2016   25OHVITD3 25 12/07/2016     Inflammation (CRP: Acute Phase) (ESR: Chronic Phase) Lab Results  Component Value Date   CRP 1.2 (H) 12/07/2016   ESRSEDRATE 45 (H) 08/18/2020   LATICACIDVEN 1.5 08/17/2020       Note: Above Lab results reviewed.  Recent Imaging Review  ECHOCARDIOGRAM COMPLETE    ECHOCARDIOGRAM REPORT       Patient Name:   XANA BRADT Date of Exam: 08/18/2020 Medical Rec #:  [id number]        Height:       66.0 in Accession #:    [id number]       Weight:       319.7 lb Date of Birth:  May 05, 1951         BSA:          2.441 m Patient Age:    35 years         BP:            135/61 mmHg Patient  Gender: F                HR:           72 bpm. Exam Location:  [facility]  Procedure: 2D Echo, Color Doppler and Cardiac Doppler  Indications:     Syncope 780.2 / R55   History:         Patient has no prior history of Echocardiogram examinations.                  CAD; Risk Factors:Hypertension. MI.   Sonographer:     Alyse Low Roar Referring Phys:  [id number] Harold Hedge Diagnosing Phys: Nelva Bush MD    Sonographer Comments: Technically difficult study due to poor echo windows. IMPRESSIONS   1. Left ventricular ejection fraction, by estimation, is >55%. The left ventricle has normal function. Left ventricular endocardial border not optimally defined to evaluate regional wall motion. There is moderate left ventricular hypertrophy. Left  ventricular diastolic parameters are consistent with Grade I diastolic dysfunction (impaired relaxation).  2. Right ventricular systolic function is normal. The right ventricular size is not well visualized. Tricuspid regurgitation signal is inadequate for assessing PA pressure.  3. The mitral valve is grossly normal. No evidence of mitral valve regurgitation. No evidence of mitral stenosis.  4. The aortic valve was not well visualized. Aortic valve regurgitation is not visualized. No aortic stenosis is present.  FINDINGS  Left Ventricle: Left ventricular ejection fraction, by estimation, is >55%. The left ventricle has normal function. Left ventricular endocardial border not optimally defined to evaluate regional wall motion. The left ventricular internal cavity size was  normal in size. There is moderate left ventricular hypertrophy. Left ventricular diastolic parameters are consistent with Grade I diastolic dysfunction (impaired relaxation).  Right Ventricle: The right ventricular size is not well visualized. No increase in right ventricular wall thickness. Right ventricular systolic function is normal. Tricuspid regurgitation signal is  inadequate for assessing PA pressure.  Left Atrium: Left atrial size was normal in size.  Right Atrium: Right atrial size was normal in size.  Pericardium: The pericardium was not well visualized. Presence of pericardial fat pad.  Mitral Valve: The mitral valve is grossly normal. No evidence of mitral valve regurgitation. No evidence of mitral valve stenosis.  Tricuspid Valve: The tricuspid valve is not well visualized. Tricuspid valve regurgitation is not demonstrated.  Aortic Valve: The aortic valve was not well visualized. Aortic valve regurgitation is not visualized. No aortic stenosis is present. Aortic valve peak gradient measures 12.7 mmHg.  Pulmonic Valve: The pulmonic valve was not well visualized. Pulmonic valve regurgitation is not visualized. No evidence of pulmonic stenosis.  Aorta: The aortic root was not well visualized.  Pulmonary Artery: The pulmonary artery is not well seen.  Venous: The inferior vena cava was not well visualized.  IAS/Shunts: The interatrial septum was not well visualized.    LEFT VENTRICLE PLAX 2D LVIDd:         5.05 cm  Diastology LVIDs:         3.50 cm  LV e' lateral:   8.92 cm/s LV PW:         1.22 cm  LV E/e' lateral: 9.1 LV IVS:        1.34 cm  LV e' medial:    7.62 cm/s LVOT diam:     1.90 cm  LV E/e' medial:  10.7 LVOT Area:     2.84 cm    RIGHT VENTRICLE RV Mid diam:  3.31 cm RV S prime:     14.70 cm/s  LEFT ATRIUM             Index       RIGHT ATRIUM           Index LA diam:        4.00 cm 1.64 cm/m  RA Area:     17.30 cm LA Vol (A2C):   57.1 ml 23.39 ml/m RA Volume:   45.50 ml  18.64 ml/m LA Vol (A4C):   43.2 ml 17.70 ml/m LA Biplane Vol: 49.8 ml 20.40 ml/m  AORTIC VALVE                PULMONIC VALVE AV Area (Vmax): 1.83 cm    PV Vmax:        1.28 m/s AV Vmax:        178.00 cm/s PV Peak grad:   6.6 mmHg AV Peak Grad:   12.7 mmHg   RVOT Peak grad: 4 mmHg LVOT Vmax:      115.00 cm/s    MITRAL VALVE MV Area  (PHT): 6.12 cm    SHUNTS MV Decel Time: 124 msec    Systemic Diam: 1.90 cm MV E velocity: 81.40 cm/s MV A velocity: 97.30 cm/s MV E/A ratio:  0.84 MV A Prime:    9.7 cm/s  Harrell Gave End MD Electronically signed by Nelva Bush MD Signature Date/Time: 08/19/2020/3:27:56 PM      Final   Note: Reviewed        Physical Exam  General appearance: alert, cooperative and moderately obese Mental status: Alert, oriented x 3 (person, place, & time)       Respiratory: No evidence of acute respiratory distress Eyes: PERLA Vitals: BP (!) 145/48   Pulse 63   Temp (!) 97.3 F (36.3 C)   Resp 18   Ht '5\' 6"'  (1.676 m)   Wt (!) 316 lb (143.3 kg)   SpO2 95%   BMI 51.00 kg/m  BMI: Estimated body mass index is 51 kg/m as calculated from the following:   Height as of this encounter: '5\' 6"'  (1.676 m).   Weight as of this encounter: 316 lb (143.3 kg). Ideal: Ideal body weight: 59.3 kg (130 lb 11.7 oz) Adjusted ideal body weight: 92.9 kg (204 lb 13.4 oz)  Facial muscles intact, no asymmetry.  Lumbar Spine Area Exam  Skin & Axial Inspection: Well healed scar from previous spine surgery detected Alignment: Symmetrical Functional ROM: Pain restricted ROM       Stability: No instability detected Muscle Tone/Strength: Functionally intact. No obvious neuro-muscular anomalies detected. Sensory (Neurological): Dermatomal pain pattern Provocative Tests: Hyperextension/rotation test: deferred today       Lumbar quadrant test (Kemp's test): deferred today       Lateral bending test: deferred today       Patrick's Maneuver: deferred today                   FABER* test: deferred today                   S-I anterior distraction/compression test: deferred today         S-I lateral compression test: deferred today         S-I Thigh-thrust test: deferred today         S-I Gaenslen's test: deferred today         *(Flexion, ABduction and External Rotation) Gait & Posture Assessment  Ambulation:  Patient  came in today in a wheel chair Gait: Limited. Using assistive device to ambulate Posture: Difficulty standing up straight, due to pain  Lower Extremity Exam    Side: Right lower extremity  Side: Left lower extremity  Stability: No instability observed          Stability: No instability observed          Skin & Extremity Inspection: Pitting edema, cellulitis of right lower extremity, positive color changes, erythema noted towards the anterior shin  Skin & Extremity Inspection: Pitting edema  Functional ROM: Pain restricted ROM for all joints of the lower extremity          Functional ROM: Pain restricted ROM for all joints of the lower extremity          Muscle Tone/Strength: Moderate-to-severe deconditioning, generalized lower extremity weakness  Muscle Tone/Strength: Moderate-to-severe deconditioning generalized lower extremity weakness  Sensory (Neurological): Neurogenic pain pattern        Sensory (Neurological): Neurogenic pain pattern        DTR: Patellar: deferred today Achilles: deferred today Plantar: deferred today  DTR: Patellar: deferred today Achilles: deferred today Plantar: deferred today  Palpation: No palpable anomalies  Palpation: No palpable anomalies     Assessment   Status Diagnosis  Persistent Persistent Persistent 1. Chronic pain syndrome   2. 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 L3-4 severe lumbar spinal stenosis (12/31/2016 MRI)   4. Primary osteoarthritis of left knee   5. Failed back surgical syndrome   6. Spinal cord stimulator status ([place])   7. Status post lumbar spine surgery for decompression of spinal cord   8. Primary osteoarthritis of right knee        Plan of Care  Ms. KIRANDEEP FARISS has a current medication list which includes the following long-term medication(s): albuterol, carvedilol, doxazosin, escitalopram, fluticasone, gabapentin, hydralazine, insulin pump, insulin regular human concentrated,  ipratropium-albuterol, metoclopramide, metolazone, pantoprazole, potassium chloride, pramipexole, telmisartan, tramadol, trazodone, and zolpidem.  Pharmacotherapy (Medications Ordered): Meds ordered this encounter  Medications  . oxyCODONE-acetaminophen (PERCOCET) 10-325 MG tablet    Sig: Take 1 tablet by mouth every 12 (twelve) hours as needed for pain. For chronic pain syndrome    Dispense:  60 tablet    Refill:  0   To last 2 months.  Continue multimodal analgesics.  Discuss concern for polypharmacy with Dr. Sabra Heck and see if there is an opportunity to wean alprazolam. Continue with spinal cord stimulation Encouraged physical activity, encouraged stationary foot bike that patient can use even when she is sitting. Reinforced the risks of concomitant benzodiazepine and opioid therapy in the context of morbid obesity and comorbid psychological issues including depression, anxiety  Orders:  Orders Placed This Encounter  Procedures  . ToxASSURE Select 13 (MW), Urine    Volume: 30 ml(s). Minimum 3 ml of urine is needed. Document temperature of fresh sample. Indications: Long term (current) use of opiate analgesic [phone])    Order Specific Question:   Release to patient    Answer:   Immediate   Follow-up plan:   Return in about 2 months (around 12/08/2020) for Medication Management, in person.     s/p GNB (R) 07/18/19-helpful, provided greater than 50% pain relief for 5 days however patient did sustain a fall due to UTI associated sepsis did spend 3 days in the hospital.  Prior to her fall she was obtaining significant pain relief with genicular nerve block.  Status post right genicular RFA  on 08/29/2019.  Left GN RFA on 02/12/19.        Recent Visits No visits were found meeting these conditions. Showing recent visits within past 90 days and meeting all other requirements Today's Visits Date Type Provider Dept  09/23/20 Office Visit [name], MD [facility]  Showing  today's visits and meeting all other requirements Future Appointments Date Type Provider Dept  12/04/20 Appointment [name], MD [facility]  Showing future appointments within next 90 days and meeting all other requirements  I discussed the assessment and treatment plan with the patient. The patient was provided an opportunity to ask questions and all were answered. The patient agreed with the plan and demonstrated an understanding of the instructions.  Patient advised to call back or seek an in-person evaluation if the symptoms or condition worsens.  Duration of encounter: 29mnutes.  Note by: BGillis Santa MD Date: 09/23/2020; Time: 2:02 PM

## 2020-10-29 ENCOUNTER — Other Ambulatory Visit: Payer: Self-pay | Admitting: Gastroenterology

## 2020-10-29 ENCOUNTER — Other Ambulatory Visit (HOSPITAL_COMMUNITY): Payer: Self-pay | Admitting: Gastroenterology

## 2020-10-29 DIAGNOSIS — K7460 Unspecified cirrhosis of liver: Secondary | ICD-10-CM

## 2020-11-04 ENCOUNTER — Ambulatory Visit: Admission: RE | Admit: 2020-11-04 | Payer: Medicare Other | Source: Ambulatory Visit

## 2020-11-28 ENCOUNTER — Other Ambulatory Visit: Payer: Self-pay | Admitting: Internal Medicine

## 2020-12-04 ENCOUNTER — Encounter: Payer: Medicare Other | Admitting: Student in an Organized Health Care Education/Training Program

## 2020-12-08 ENCOUNTER — Encounter: Payer: Medicare Other | Admitting: Student in an Organized Health Care Education/Training Program

## 2020-12-17 ENCOUNTER — Other Ambulatory Visit: Payer: Self-pay

## 2020-12-17 ENCOUNTER — Encounter: Payer: Self-pay | Admitting: Student in an Organized Health Care Education/Training Program

## 2020-12-17 ENCOUNTER — Ambulatory Visit
Payer: Medicare Other | Attending: Student in an Organized Health Care Education/Training Program | Admitting: Student in an Organized Health Care Education/Training Program

## 2020-12-17 VITALS — BP 138/52 | HR 65 | Temp 98.2°F | Resp 18 | Ht 66.0 in | Wt 313.0 lb

## 2020-12-17 DIAGNOSIS — M48061 Spinal stenosis, lumbar region without neurogenic claudication: Secondary | ICD-10-CM | POA: Diagnosis present

## 2020-12-17 DIAGNOSIS — M47816 Spondylosis without myelopathy or radiculopathy, lumbar region: Secondary | ICD-10-CM | POA: Diagnosis present

## 2020-12-17 DIAGNOSIS — Z9889 Other specified postprocedural states: Secondary | ICD-10-CM | POA: Diagnosis present

## 2020-12-17 DIAGNOSIS — Z9689 Presence of other specified functional implants: Secondary | ICD-10-CM | POA: Diagnosis present

## 2020-12-17 DIAGNOSIS — G894 Chronic pain syndrome: Secondary | ICD-10-CM | POA: Diagnosis present

## 2020-12-17 DIAGNOSIS — M797 Fibromyalgia: Secondary | ICD-10-CM | POA: Diagnosis present

## 2020-12-17 DIAGNOSIS — M7918 Myalgia, other site: Secondary | ICD-10-CM | POA: Diagnosis present

## 2020-12-17 DIAGNOSIS — M1712 Unilateral primary osteoarthritis, left knee: Secondary | ICD-10-CM | POA: Diagnosis present

## 2020-12-17 DIAGNOSIS — M792 Neuralgia and neuritis, unspecified: Secondary | ICD-10-CM | POA: Diagnosis present

## 2020-12-17 DIAGNOSIS — M1711 Unilateral primary osteoarthritis, right knee: Secondary | ICD-10-CM | POA: Diagnosis present

## 2020-12-17 DIAGNOSIS — M5416 Radiculopathy, lumbar region: Secondary | ICD-10-CM | POA: Diagnosis present

## 2020-12-17 DIAGNOSIS — M961 Postlaminectomy syndrome, not elsewhere classified: Secondary | ICD-10-CM | POA: Diagnosis present

## 2020-12-17 DIAGNOSIS — M4804 Spinal stenosis, thoracic region: Secondary | ICD-10-CM | POA: Diagnosis present

## 2020-12-17 MED ORDER — OXYCODONE-ACETAMINOPHEN 10-325 MG PO TABS
1.0000 | ORAL_TABLET | Freq: Every day | ORAL | 0 refills | Status: DC | PRN
Start: 2020-12-17 — End: 2021-02-12

## 2020-12-17 NOTE — Progress Notes (Signed)
PROVIDER NOTE: Information contained herein reflects review and annotations entered in association with encounter. Interpretation of such information and data should be left to medically-trained personnel. Information provided to patient can be located elsewhere in the medical record under "Patient Instructions". Document created using STT-dictation technology, any transcriptional errors that may result from process are unintentional.    Patient: Brittney [name]  Service Category: E/M  Provider: Bilal Lateef, MD  DOB: 08/08/1951  DOS: 12/17/2020  Specialty: Interventional Pain Management  MRN: [id number]  Setting: Ambulatory outpatient  PCP: Miller, Mark F, MD  Type: Established Patient    Referring Provider: Miller, Mark F, MD  Location: Office  Delivery: Face-to-face     HPI  Brittney [name], a 69 y.o. year old female, is here today because of her Chronic pain syndrome [G89.4]. Ms. Cove's primary complain today is Back Pain (low), Leg Pain (bilateral), and Foot Pain (bilateral) Last encounter: My last encounter with her was on 12/08/2020. Pertinent problems: Ms. 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 Pain Assessment: Severity of Chronic pain is reported as a 7 /10. Location: Back Lower/legs and feet. Onset: More than a month ago. Quality: Aching,Constant,Sharp. Timing: Constant. Modifying factor(s): medication, rest, positioning, feet up. Vitals:  height is 5' 6" (1.676 m) and weight is 313 lb (142 kg) (abnormal). Her temporal temperature is 98.2 F (36.8 C). Her blood pressure is 138/52 (abnormal) and her pulse is 65. Her respiration is 18 and oxygen saturation is 92%.   Reason for encounter: medication management.    Patient follows up today for medication management.  She has been dealing with a left heel and midfoot ulcer as well as cellulitis of that area.  This is being followed by podiatry as well as her PCP, Dr. Miller.  She has a gauze bandage on that area.  An order for wound care has been placed by Dr. Miller.  She is instructed to return to podiatry clinic later this month.  Otherwise, she has not had any falls.  She is endorsing more sciatica pain down her left lateral leg.  She states that she has been trying to use her spinal cord stimulator which is providing limited pain relief.  She states that she has been using two oxycodones a day given increased pain from her left foot ulcer.    Pharmacotherapy Assessment   Analgesic: Percocet 10 mg daily as needed for breakthrough pain Monitoring: [place] PMP: PDMP reviewed during this encounter.       Pharmacotherapy: No side-effects or adverse reactions reported. Compliance: No problems identified. Effectiveness: Clinically acceptable.  Shatley, Jennifer C, RN  12/17/2020  2:42 PM  Sign when Signing Visit Nursing Pain Medication Assessment:    Safety precautions to be maintained throughout the outpatient stay will include: orient to surroundings, keep bed in low  position, maintain call bell within reach at all times, provide assistance with transfer out of bed and ambulation.  Medication Inspection Compliance: Pill count conducted under aseptic conditions, in front of the patient. Neither the pills nor the bottle was removed from the patient's sight at any time. Once count was completed pills were immediately returned to the patient in their original bottle.  Medication: Oxycodone IR Pill/Patch Count: 5 of 60 pills remain Pill/Patch Appearance: Markings consistent with prescribed medication Bottle Appearance: Standard pharmacy container. Clearly labeled. Filled Date: 10 / 28 / 2021 Last Medication intake:  Today    UDS:  Summary  Date Value Ref Range Status  01/03/2019 FINAL  Final    Comment:    ==================================================================== TOXASSURE SELECT 13 (MW) ==================================================================== Test                             Result       Flag       Units Drug Present and Declared for Prescription Verification   Alprazolam                     165          EXPECTED   ng/mg creat   Alpha-hydroxyalprazolam        412          EXPECTED   ng/mg creat    Source of alprazolam is a scheduled prescription medication.    Alpha-hydroxyalprazolam is an expected metabolite of alprazolam.   Oxycodone                      812          EXPECTED   ng/mg creat   Oxymorphone                    932          EXPECTED   ng/mg creat   Noroxycodone                   1294         EXPECTED   ng/mg creat   Noroxymorphone                 332          EXPECTED   ng/mg creat    Sources of oxycodone are scheduled prescription medications.    Oxymorphone, noroxycodone, and noroxymorphone are expected    metabolites of oxycodone. Oxymorphone is also available as a    scheduled prescription medication.   Tramadol                       2565         EXPECTED   ng/mg creat   O-Desmethyltramadol            7935          EXPECTED   ng/mg creat   N-Desmethyltramadol            2141         EXPECTED   ng/mg creat    Source of tramadol is a prescription medication.    O-desmethyltramadol and N-desmethyltramadol are expected    metabolites of tramadol.   Butalbital                       PRESENT      EXPECTED ==================================================================== Test                      Result    Flag   Units      Ref Range   Creatinine              34               mg/dL      >=20 ==================================================================== Declared Medications:  The flagging and interpretation on this report are based on the  following declared medications.  Unexpected results may arise from  inaccuracies in the declared medications.  **Note: The testing scope of this panel includes these medications:  Alprazolam  Butalbital (Butalbital/APAP/Caffeine)  Oxycodone (Percocet)  Tramadol (Ultram)  **Note: The testing scope of this panel does not include following  reported medications:  Acetaminophen (Butalbital/APAP/Caffeine)  Acetaminophen (Percocet)  Albuterol  Bumetanide  Caffeine (Butalbital/APAP/Caffeine)  Candesartan  Carvedilol  Clonidine  Cyclobenzaprine (Flexeril)  Doxazosin (Cardura)  Escitalopram (Lexapro)  Fluticasone (Flonase)  Fluticasone (Flovent)  Gabapentin (Neurontin)  Hydralazine (Apresoline)  Ibuprofen (Advil)  Insulin (Humulin)  Insulin (Levemir)  Lactulose  Levothyroxine  Lubiprostone (Amitiza)  Metoclopramide (Reglan)  Pantoprazole (Protonix)  Potassium  Pramipexole (Mirapex)  Sucralfate (Carafate)  Topical Diclofenac (Voltaren)  Trazodone (Desyrel)  Zolpidem (Ambien) ==================================================================== For clinical consultation, please call [phone]. ====================================================================      ROS  Constitutional: Denies any fever or chills Gastrointestinal: No  reported hemesis, hematochezia, vomiting, or acute GI distress Musculoskeletal: Low back, bilateral leg pain with radiation into left lower extremity in a dermatomal fashion Neurological: No reported episodes of acute onset apraxia, aphasia, dysarthria, agnosia, amnesia, paralysis, loss of coordination, or loss of consciousness  Medication Review  ALPRAZolam, albuterol, budesonide, butalbital-acetaminophen-caffeine, carvedilol, cloNIDine, cyclobenzaprine, doxazosin, escitalopram, fluticasone, furosemide, gabapentin, hydrALAZINE, ibuprofen, insulin pump, insulin regular human CONCENTRATED, ipratropium-albuterol, levothyroxine, linaclotide, metoCLOPramide, metolazone, ondansetron, oxyCODONE-acetaminophen, pantoprazole, potassium chloride, pramipexole, telmisartan, traMADol, traZODone, and zolpidem  History Review  Allergy: Ms. Croy is allergic to eggs or egg-derived products, shellfish allergy, amlodipine, codeine, imdur [isosorbide dinitrate], lyrica [pregabalin], monosodium glutamate, and tape. Drug: Ms. Mamula  reports no history of drug use. Alcohol:  reports no history of alcohol use. Tobacco:  reports that she has never smoked. She has never used smokeless tobacco. Social: Ms. Tietje  reports that she has never smoked. She has never used smokeless tobacco. She reports that she does not drink alcohol and does not use drugs. Medical:  has a past medical history of Abdominal pain, left upper quadrant (12/11/2012), Arthritis, Asthma, Broken leg (2014), Cancer ([place]) (2007), Chronic kidney disease, stage 2 (mild), Chronic lower back pain, Collagen vascular disease ([place]), Coronary artery disease, Diabetes mellitus without complication ([place]), Diabetic nephropathy associated with secondary diabetes mellitus ([place]), [place] (12/11/2012), Gastroparesis, Gross hematuria (12/11/2012), Hepatic cirrhosis ([place]), History of kidney stones, Hyperlipidemia, Hypertension, Hypothyroidism, Hypothyroidism, IBS  (irritable bowel syndrome), Kidney stone (12/11/2012), Lower extremity edema, Morbid obesity with BMI of 40.0-44.9, adult ([place]), Myocardial infarction ([place]) (09/2013), Nausea without vomiting (12/11/2012), Nephrolithiasis, Nephrolithiasis (04/26/2014), Nonproliferative retinopathy due to secondary diabetes ([place]), Numbness and tingling of right leg, Peripheral vascular disease ([place]), Renal colic (51/76/1607), Renal insufficiency, Sciatica, Sciatica (12/11/2012), Skin cancer (08/2018), Sleep apnea, Stroke ([place]), and Unilateral small kidney without contralateral hypertrophy. Surgical: Ms. Roebuck  has a past surgical history that includes Cesarean section (1980); Dilation and curettage of uterus; Eye surgery (1986); Abdominal hysterectomy; Tonsillectomy; Esophagogastroduodenoscopy (02/08/2014); Colonoscopy (05/04/2001); Esophagogastroduodenoscopy (egd) with propofol (N/A,  09/20/2016); Colonoscopy with propofol (N/A, 09/20/2016); Mohs surgery (11/2016); Cholecystectomy; Hernia repair; Joint replacement (Left, 09/24/2013); Back surgery (04/1999); Pulse generator implant (N/A, 11/02/2017); Esophagogastroduodenoscopy (egd) with propofol (N/A, 05/23/2018); and Esophagogastroduodenoscopy (egd) with propofol (N/A, 07/18/2020). Family: family history includes Asthma in her mother; Cancer in her mother; Diabetes in her father; Heart disease in her mother; Hypertension in her father; Kidney disease in her father.  Laboratory Chemistry Profile   Renal Lab Results  Component Value Date   BUN 14 08/21/2020   CREATININE 0.72 08/21/2020   GFRAA >60 08/21/2020   GFRNONAA >60 08/21/2020     Hepatic Lab Results  Component Value Date   AST 55 (H) 08/17/2020   ALT 23 08/17/2020   ALBUMIN 3.3 (L) 08/21/2020   ALKPHOS 96 08/17/2020   LIPASE 114 10/22/2014     Electrolytes Lab Results  Component Value Date   NA 136 08/21/2020   K 3.3 (L) 08/21/2020   CL 91 (L) 08/21/2020   CALCIUM 10.0 08/21/2020   MG 1.8 08/21/2020    PHOS 3.0 08/21/2020     Bone Lab Results  Component Value Date   25OHVITD1 26 (L) 12/07/2016   25OHVITD2 1.2 12/07/2016   25OHVITD3 25 12/07/2016     Inflammation (CRP: Acute Phase) (ESR: Chronic Phase) Lab Results  Component Value Date   CRP 1.2 (H) 12/07/2016   ESRSEDRATE 45 (H) 08/18/2020   LATICACIDVEN 1.5 08/17/2020       Note: Above Lab results reviewed.  Recent Imaging Review  ECHOCARDIOGRAM COMPLETE    ECHOCARDIOGRAM REPORT       Patient Name:   Megan B Nappi Date of Exam: 08/18/2020 Medical Rec #:  [id number]        Height:       66.0 in Accession #:    [id number]       Weight:       319.7 lb Date of Birth:  08/05/1951         BSA:          2.441 m Patient Age:    69 years         BP:           135/61 mmHg Patient Gender: F                HR:           72 bpm. Exam Location:  [facility]  Procedure: 2D Echo, Color Doppler and Cardiac Doppler  Indications:     Syncope 780.2 / R55   History:         Patient has no prior history of Echocardiogram examinations.                  CAD; Risk Factors:Hypertension. MI.   Sonographer:     Christy Roar Referring Phys:  [id number] JARED E SEGAL Diagnosing Phys: Christopher End MD    Sonographer Comments: Technically difficult study due to poor echo windows. IMPRESSIONS   1. Left ventricular ejection fraction, by estimation, is >55%. The left ventricle has normal function. Left ventricular endocardial border not optimally defined to evaluate regional wall motion. There is moderate left ventricular hypertrophy. Left  ventricular diastolic parameters are consistent with Grade I diastolic dysfunction (impaired relaxation).  2. Right ventricular systolic function is normal. The right ventricular size is not well visualized. Tricuspid regurgitation signal is inadequate for assessing PA pressure.  3. The mitral valve is grossly normal. No evidence of mitral valve regurgitation. No evidence of mitral stenosis.    4. The aortic valve  was not well visualized. Aortic valve regurgitation is not visualized. No aortic stenosis is present.  FINDINGS  Left Ventricle: Left ventricular ejection fraction, by estimation, is >55%. The left ventricle has normal function. Left ventricular endocardial border not optimally defined to evaluate regional wall motion. The left ventricular internal cavity size was  normal in size. There is moderate left ventricular hypertrophy. Left ventricular diastolic parameters are consistent with Grade I diastolic dysfunction (impaired relaxation).  Right Ventricle: The right ventricular size is not well visualized. No increase in right ventricular wall thickness. Right ventricular systolic function is normal. Tricuspid regurgitation signal is inadequate for assessing PA pressure.  Left Atrium: Left atrial size was normal in size.  Right Atrium: Right atrial size was normal in size.  Pericardium: The pericardium was not well visualized. Presence of pericardial fat pad.  Mitral Valve: The mitral valve is grossly normal. No evidence of mitral valve regurgitation. No evidence of mitral valve stenosis.  Tricuspid Valve: The tricuspid valve is not well visualized. Tricuspid valve regurgitation is not demonstrated.  Aortic Valve: The aortic valve was not well visualized. Aortic valve regurgitation is not visualized. No aortic stenosis is present. Aortic valve peak gradient measures 12.7 mmHg.  Pulmonic Valve: The pulmonic valve was not well visualized. Pulmonic valve regurgitation is not visualized. No evidence of pulmonic stenosis.  Aorta: The aortic root was not well visualized.  Pulmonary Artery: The pulmonary artery is not well seen.  Venous: The inferior vena cava was not well visualized.  IAS/Shunts: The interatrial septum was not well visualized.    LEFT VENTRICLE PLAX 2D LVIDd:         5.05 cm  Diastology LVIDs:         3.50 cm  LV e' lateral:   8.92 cm/s LV PW:         1.22 cm  LV E/e'  lateral: 9.1 LV IVS:        1.34 cm  LV e' medial:    7.62 cm/s LVOT diam:     1.90 cm  LV E/e' medial:  10.7 LVOT Area:     2.84 cm    RIGHT VENTRICLE RV Mid diam:    3.31 cm RV S prime:     14.70 cm/s  LEFT ATRIUM             Index       RIGHT ATRIUM           Index LA diam:        4.00 cm 1.64 cm/m  RA Area:     17.30 cm LA Vol (A2C):   57.1 ml 23.39 ml/m RA Volume:   45.50 ml  18.64 ml/m LA Vol (A4C):   43.2 ml 17.70 ml/m LA Biplane Vol: 49.8 ml 20.40 ml/m  AORTIC VALVE                PULMONIC VALVE AV Area (Vmax): 1.83 cm    PV Vmax:        1.28 m/s AV Vmax:        178.00 cm/s PV Peak grad:   6.6 mmHg AV Peak Grad:   12.7 mmHg   RVOT Peak grad: 4 mmHg LVOT Vmax:      115.00 cm/s    MITRAL VALVE MV Area (PHT): 6.12 cm    SHUNTS MV Decel Time: 124 msec    Systemic Diam: 1.90 cm MV E velocity: 81.40 cm/s MV A velocity: 97.30 cm/s MV  E/A ratio:  0.84 MV A Prime:    9.7 cm/s  Christopher End MD Electronically signed by Christopher End MD Signature Date/Time: 08/19/2020/3:27:56 PM      Final   Note: Reviewed        Physical Exam  General appearance: Well nourished, well developed, and well hydrated. In no apparent acute distress Mental status: Alert, oriented x 3 (person, place, & time)       Respiratory: No evidence of acute respiratory distress Eyes: PERLA Vitals: BP (!) 138/52   Pulse 65   Temp 98.2 F (36.8 C) (Temporal)   Resp 18   Ht 5' 6" (1.676 m)   Wt (!) 313 lb (142 kg)   SpO2 92%   BMI 50.52 kg/m  BMI: Estimated body mass index is 50.52 kg/m as calculated from the following:   Height as of this encounter: 5' 6" (1.676 m).   Weight as of this encounter: 313 lb (142 kg). Ideal: Ideal body weight: 59.3 kg (130 lb 11.7 oz) Adjusted ideal body weight: 92.4 kg (203 lb 10.2 oz)   Lumbar Spine Area Exam  Skin & Axial Inspection: Well healed scar from previous spine surgery detected Alignment: Symmetrical Functional ROM: Pain restricted ROM        Stability: No instability detected Muscle Tone/Strength: Functionally intact. No obvious neuro-muscular anomalies detected. Sensory (Neurological): Dermatomal pain pattern Provocative Tests: Hyperextension/rotation test: deferred today       Lumbar quadrant test (Kemp's test): deferred today       Lateral bending test: deferred today       Patrick's Maneuver: deferred today                   FABER* test: deferred today                   S-I anterior distraction/compression test: deferred today         S-I lateral compression test: deferred today         S-I Thigh-thrust test: deferred today         S-I Gaenslen's test: deferred today         *(Flexion, ABduction and External Rotation) Gait & Posture Assessment  Ambulation: Patient came in today in a wheel chair Gait: Limited. Using assistive device to ambulate Posture: Difficulty standing up straight, due to pain  Lower Extremity Exam    Side: Right lower extremity  Side: Left lower extremity  Stability: No instability observed          Stability: No instability observed          Skin & Extremity Inspection: Pitting edema, cellulitis of right lower extremity, positive color changes, erythema noted towards the anterior shin, gauze in place  Skin & Extremity Inspection: Pitting edema  Functional ROM: Pain restricted ROM for all joints of the lower extremity          Functional ROM: Pain restricted ROM for all joints of the lower extremity          Muscle Tone/Strength: Moderate-to-severe deconditioning, generalized lower extremity weakness  Muscle Tone/Strength: Moderate-to-severe deconditioning generalized lower extremity weakness  Sensory (Neurological): Neurogenic pain pattern        Sensory (Neurological): Neurogenic pain pattern        DTR: Patellar: deferred today Achilles: deferred today Plantar: deferred today  DTR: Patellar: deferred today Achilles: deferred today Plantar: deferred today  Palpation: No palpable  anomalies  Palpation: No palpable anomalies     Assessment     Status Diagnosis  Persistent Persistent Persistent 1. Chronic pain syndrome   2. 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 L3-4 severe lumbar spinal stenosis (12/31/2016 MRI)   4. Primary osteoarthritis of left knee   5. Failed back surgical syndrome   6. Spinal cord stimulator status ([place])   7. Status post lumbar spine surgery for decompression of spinal cord   8. Primary osteoarthritis of right knee   9. Myofascial pain   10. Spinal stenosis of thoracic region   11. Fibromyalgia   12. Neurogenic pain       Plan of Care   Ms. CAMYAH PULTZ has a current medication list which includes the following long-term medication(s): albuterol, carvedilol, doxazosin, escitalopram, fluticasone, gabapentin, hydralazine, insulin pump, insulin regular human concentrated, ipratropium-albuterol, metolazone, pantoprazole, potassium chloride, pramipexole, telmisartan, tramadol, trazodone, zolpidem, and metoclopramide.  Pharmacotherapy (Medications Ordered): Meds ordered this encounter  Medications  . oxyCODONE-acetaminophen (PERCOCET) 10-325 MG tablet    Sig: Take 1 tablet by mouth daily as needed for pain.    Dispense:  60 tablet    Refill:  0    For chronic pain syndrome   Follow-up plan:   Return in about 8 weeks (around 02/11/2021) for Medication Management, in person.     s/p GNB (R) 07/18/19-helpful, provided greater than 50% pain relief for 5 days however patient did sustain a fall due to UTI associated sepsis did spend 3 days in the hospital.  Prior to her fall she was obtaining significant pain relief with genicular nerve block.  Status post right genicular RFA on 08/29/2019.  Left GN RFA on 02/12/19.         Recent Visits Date Type Provider Dept  09/23/20 Office Visit [name], MD [facility]  Showing recent visits within past 90 days and meeting all other requirements Today's  Visits Date Type Provider Dept  12/17/20 Office Visit [name], MD [facility]  Showing today's visits and meeting all other requirements Future Appointments Date Type Provider Dept  02/12/21 Appointment [name], MD [facility]  Showing future appointments within next 90 days and meeting all other requirements  I discussed the assessment and treatment plan with the patient. The patient was provided an opportunity to ask questions and all were answered. The patient agreed with the plan and demonstrated an understanding of the instructions.  Patient advised to call back or seek an in-person evaluation if the symptoms or condition worsens.  Duration of encounter: 8mnutes.  Note by: BGillis Santa MD Date: 12/17/2020; Time: 3:14 PM

## 2020-12-17 NOTE — Progress Notes (Signed)
Nursing Pain Medication Assessment:  Safety precautions to be maintained throughout the outpatient stay will include: orient to surroundings, keep bed in low position, maintain call bell within reach at all times, provide assistance with transfer out of bed and ambulation.  Medication Inspection Compliance: Pill count conducted under aseptic conditions, in front of the patient. Neither the pills nor the bottle was removed from the patient's sight at any time. Once count was completed pills were immediately returned to the patient in their original bottle.  Medication: Oxycodone IR Pill/Patch Count: 5 of 60 pills remain Pill/Patch Appearance: Markings consistent with prescribed medication Bottle Appearance: Standard pharmacy container. Clearly labeled. Filled Date: 54 / 28 / 2021 Last Medication intake:  Today

## 2021-02-02 ENCOUNTER — Encounter: Payer: Self-pay | Admitting: Oncology

## 2021-02-02 ENCOUNTER — Inpatient Hospital Stay: Payer: Medicare Other | Attending: Oncology

## 2021-02-02 ENCOUNTER — Inpatient Hospital Stay: Payer: Medicare Other

## 2021-02-02 ENCOUNTER — Inpatient Hospital Stay (HOSPITAL_BASED_OUTPATIENT_CLINIC_OR_DEPARTMENT_OTHER): Payer: Medicare Other | Admitting: Oncology

## 2021-02-02 VITALS — BP 144/58 | HR 62 | Resp 20

## 2021-02-02 VITALS — BP 148/59 | HR 63 | Resp 18

## 2021-02-02 DIAGNOSIS — D649 Anemia, unspecified: Secondary | ICD-10-CM

## 2021-02-02 DIAGNOSIS — D509 Iron deficiency anemia, unspecified: Secondary | ICD-10-CM | POA: Insufficient documentation

## 2021-02-02 LAB — IRON AND TIBC
Iron: 32 ug/dL (ref 28–170)
Saturation Ratios: 10 % — ABNORMAL LOW (ref 10.4–31.8)
TIBC: 328 ug/dL (ref 250–450)
UIBC: 296 ug/dL

## 2021-02-02 LAB — FERRITIN: Ferritin: 34 ng/mL (ref 11–307)

## 2021-02-02 LAB — CBC WITH DIFFERENTIAL/PLATELET
Abs Immature Granulocytes: 0.06 10*3/uL (ref 0.00–0.07)
Basophils Absolute: 0.1 10*3/uL (ref 0.0–0.1)
Basophils Relative: 1 %
Eosinophils Absolute: 0.5 10*3/uL (ref 0.0–0.5)
Eosinophils Relative: 4 %
HCT: 37.3 % (ref 36.0–46.0)
Hemoglobin: 11.7 g/dL — ABNORMAL LOW (ref 12.0–15.0)
Immature Granulocytes: 1 %
Lymphocytes Relative: 20 %
Lymphs Abs: 2.1 10*3/uL (ref 0.7–4.0)
MCH: 27.5 pg (ref 26.0–34.0)
MCHC: 31.4 g/dL (ref 30.0–36.0)
MCV: 87.8 fL (ref 80.0–100.0)
Monocytes Absolute: 0.7 10*3/uL (ref 0.1–1.0)
Monocytes Relative: 6 %
Neutro Abs: 7.2 10*3/uL (ref 1.7–7.7)
Neutrophils Relative %: 68 %
Platelets: 195 10*3/uL (ref 150–400)
RBC: 4.25 MIL/uL (ref 3.87–5.11)
RDW: 13.7 % (ref 11.5–15.5)
WBC: 10.6 10*3/uL — ABNORMAL HIGH (ref 4.0–10.5)
nRBC: 0 % (ref 0.0–0.2)

## 2021-02-02 MED ORDER — SODIUM CHLORIDE 0.9 % IV SOLN
Freq: Once | INTRAVENOUS | Status: AC
  Filled 2021-02-02: qty 250

## 2021-02-02 MED ORDER — SODIUM CHLORIDE 0.9 % IV SOLN
510.0000 mg | Freq: Once | INTRAVENOUS | Status: AC
  Administered 2021-02-02: 510 mg via INTRAVENOUS
  Filled 2021-02-02: qty 510

## 2021-02-02 NOTE — Progress Notes (Signed)
Patient denies any concerns today.  

## 2021-02-02 NOTE — Progress Notes (Signed)
Patient monitored 25 minutes post Feraheme treatment. Patient and VSS. Discharged home with daughter.

## 2021-02-02 NOTE — Progress Notes (Signed)
[place]  [phone] [phone]) [phone]  ID: Brittney [name] OB: 02/05/1951  MR#: [id number]  GEX#:[id number]  Patient Care Team: Rusty Aus, MD as PCP - General (Internal Medicine) Lloyd Huger, MD as Consulting Physician (Hematology and Oncology)  CHIEF COMPLAINT: Iron deficiency anemia.  INTERVAL HISTORY: Patient returns to clinic today for repeat laboratory work and further evaluation.  She was last seen in clinic in May 2021.  She last received IV Feraheme on 03/03/2020 and 03/10/2020.  Today, she continues to have weakness and fatigue.  States she believes her iron is low.  Has shortness of breath with exertion.  Feels similar to how she did prior to iron infusion in the past.  Denies any recent fevers or illness.  Has a good appetite and denies weight loss.  Denies chest pain, cough or hemoptysis. She has no nausea, vomiting, constipation, or diarrhea. She denies any melena or hematochezia. She has no urinary complaints.  Patient offers no further specific complaints today.  REVIEW OF SYSTEMS:   Review of Systems  Constitutional: Positive for malaise/fatigue. Negative for fever and weight loss.  Respiratory: Positive for shortness of breath. Negative for cough and hemoptysis.   Cardiovascular: Negative.  Negative for chest pain and leg swelling.  Gastrointestinal: Negative.  Negative for abdominal pain, blood in stool and melena.  Genitourinary: Negative.  Negative for dysuria.  Musculoskeletal: Negative.  Negative for back pain.  Skin: Negative.  Negative for rash.  Neurological: Positive for dizziness and weakness. Negative for sensory change, focal weakness and headaches.  Psychiatric/Behavioral: Negative.  The patient is not nervous/anxious.     As per HPI. Otherwise, a complete review of systems is negative.  PAST MEDICAL HISTORY: Past Medical History:  Diagnosis Date  . Abdominal pain, left upper quadrant 12/11/2012  .  Arthritis   . Asthma   . Broken leg 2014   fell twice and broke same leg. had a bone stimulator for first break.  . Cancer ([place]) 2007   melanoma, left ear and back of left leg, removed from back  . Chronic kidney disease, stage 2 (mild)   . Chronic lower back pain   . Collagen vascular disease ([place])   . Coronary artery disease   . Diabetes mellitus without complication ([place])   . Diabetic nephropathy associated with secondary diabetes mellitus ([place])   . [place] 12/11/2012  . Gastroparesis   . Gross hematuria 12/11/2012  . Hepatic cirrhosis ([place])   . History of kidney stones   . Hyperlipidemia   . Hypertension   . Hypothyroidism   . Hypothyroidism   . IBS (irritable bowel syndrome)   . Kidney stone 12/11/2012  . Lower extremity edema   . Morbid obesity with BMI of 40.0-44.9, adult ([place])   . Myocardial infarction ([place]) 09/2013  . Nausea without vomiting 12/11/2012  . Nephrolithiasis   . Nephrolithiasis 04/26/2014  . Nonproliferative retinopathy due to secondary diabetes ([place])   . Numbness and tingling of right leg   . Peripheral vascular disease ([place])   . Renal colic 12/29/7251  . Renal insufficiency   . Sciatica   . Sciatica 12/11/2012  . Skin cancer 08/2018  . Sleep apnea    sleep study coming in november  . Stroke ([place])   . Unilateral small kidney without contralateral hypertrophy     PAST SURGICAL HISTORY: Past Surgical History:  Procedure Laterality Date  . ABDOMINAL HYSTERECTOMY    . BACK SURGERY  04/1999   L-4-5 LAMINECTOMY.  metal in lower back and neck  . CESAREAN SECTION  1980  . CHOLECYSTECTOMY    . COLONOSCOPY  05/04/2001  . COLONOSCOPY WITH PROPOFOL N/A 09/20/2016   Procedure: COLONOSCOPY WITH PROPOFOL;  Surgeon: Lollie Sails, MD;  Location: [facility] ENDOSCOPY;  Service: Endoscopy;  Laterality: N/A;  . DILATION AND CURETTAGE OF UTERUS    . ESOPHAGOGASTRODUODENOSCOPY  02/08/2014  . ESOPHAGOGASTRODUODENOSCOPY (EGD) WITH PROPOFOL N/A 09/20/2016    Procedure: ESOPHAGOGASTRODUODENOSCOPY (EGD) WITH PROPOFOL;  Surgeon: Lollie Sails, MD;  Location: [facility] ENDOSCOPY;  Service: Endoscopy;  Laterality: N/A;  . ESOPHAGOGASTRODUODENOSCOPY (EGD) WITH PROPOFOL N/A 05/23/2018   Procedure: ESOPHAGOGASTRODUODENOSCOPY (EGD) WITH PROPOFOL;  Surgeon: Lollie Sails, MD;  Location: [facility] ENDOSCOPY;  Service: Endoscopy;  Laterality: N/A;  . ESOPHAGOGASTRODUODENOSCOPY (EGD) WITH PROPOFOL N/A 07/18/2020   Procedure: ESOPHAGOGASTRODUODENOSCOPY (EGD) WITH PROPOFOL;  Surgeon: Lesly Rubenstein, MD;  Location: [facility] ENDOSCOPY;  Service: Gastroenterology;  Laterality: N/A;  . EYE SURGERY  1986   FOR MELANOMA  . HERNIA REPAIR    . JOINT REPLACEMENT Left 09/24/2013   TOTAL KNEE  . MOHS SURGERY  11/2016   left side of nose  . PULSE GENERATOR IMPLANT N/A 11/02/2017   Procedure: UNILATERAL PULSE GENERATOR IMPLANT;  Surgeon: Meade Maw, MD;  Location: [facility] ORS;  Service: Neurosurgery;  Laterality: N/A;  . TONSILLECTOMY     WITH ADENOIDECTOMY    FAMILY HISTORY: Family History  Problem Relation Age of Onset  . Heart disease Mother   . Cancer Mother   . Asthma Mother   . Diabetes Father   . Kidney disease Father   . Hypertension Father   . Breast cancer Neg Hx     ADVANCED DIRECTIVES (Y/N):  N  HEALTH MAINTENANCE: Social History   Tobacco Use  . Smoking status: Never Smoker  . Smokeless tobacco: Never Used  Vaping Use  . Vaping Use: Never used  Substance Use Topics  . Alcohol use: No  . Drug use: Never     Colonoscopy:  PAP:  Bone density:  Lipid panel:  Allergies  Allergen Reactions  . Shellfish Allergy Nausea And Vomiting, Diarrhea and Nausea Only    Non stop sickness once ingests shellfish. Betadine is okay  . Amlodipine Cough  . Codeine Nausea And Vomiting and Nausea Only    states she can take codeine cough syrup without problems states she can take codeine cough syrup without problems  . Imdur [Isosorbide Dinitrate]  Other (See Comments) and Cough    Headache  . Lyrica [Pregabalin] Swelling and Other (See Comments)    "FLU-LIKE" SYMPTOMS  . Monosodium Glutamate Other (See Comments) and Cough    Headache  . Tape Other (See Comments) and Nausea And Vomiting    can use paper tape Redness    Current Outpatient Medications  Medication Sig Dispense Refill  . albuterol (VENTOLIN HFA) 108 (90 Base) MCG/ACT inhaler Inhale 2 puffs into the lungs 3 (three) times daily as needed for wheezing or shortness of breath.     . ALPRAZolam (XANAX) 0.5 MG tablet Take 0.5 mg by mouth 4 (four) times daily. Take    . budesonide (PULMICORT) 0.5 MG/2ML nebulizer solution Take 2 mLs by nebulization daily.    . butalbital-acetaminophen-caffeine (FIORICET, ESGIC) 50-325-40 MG tablet Take 1 tablet by mouth 2 (two) times daily as needed for migraine.     . carvedilol (COREG) 25 MG tablet Take 50 mg by mouth 2 (two) times daily with a meal.     . cloNIDine (  CATAPRES - DOSED IN MG/24 HR) 0.3 mg/24hr patch Place 0.3 mg onto the skin once a week.     . cyclobenzaprine (FLEXERIL) 10 MG tablet Take 20 mg by mouth at bedtime.    Marland Kitchen doxazosin (CARDURA) 4 MG tablet Take 4 mg by mouth every evening.     . escitalopram (LEXAPRO) 10 MG tablet Take 10 mg by mouth daily.     . fluticasone (FLONASE) 50 MCG/ACT nasal spray Place 2 sprays into both nostrils as needed.     . furosemide (LASIX) 40 MG tablet Take 40 mg by mouth daily.    Marland Kitchen gabapentin (NEURONTIN) 600 MG tablet Take 1 tablet (600mg ) by mouth twice daily and take 3 tablets (1800mg ) by mouth at bedtime    . hydrALAZINE (APRESOLINE) 50 MG tablet Take 50 mg by mouth 3 (three) times daily.     Marland Kitchen ibuprofen (ADVIL,MOTRIN) 800 MG tablet Take 800 mg by mouth 3 (three) times daily as needed for mild pain or moderate pain.     . Insulin Human (INSULIN PUMP) SOLN Inject into the skin.    Marland Kitchen insulin regular human CONCENTRATED (HUMULIN R) 500 UNIT/ML kwikpen Inject 40-80 Units into the skin See admin  instructions. Uses insulin pump    . ipratropium-albuterol (DUONEB) 0.5-2.5 (3) MG/3ML SOLN Take 3 mLs by nebulization every 6 (six) hours.    Marland Kitchen levothyroxine (SYNTHROID) 200 MCG tablet Take 200 mcg by mouth daily before breakfast.    . LINZESS 290 MCG CAPS capsule Take 290 mcg by mouth daily.    . ondansetron (ZOFRAN) 4 MG tablet Take 4 mg by mouth every 12 (twelve) hours as needed for nausea or vomiting.     Marland Kitchen oxyCODONE-acetaminophen (PERCOCET) 10-325 MG tablet Take 1 tablet by mouth daily as needed for pain. 60 tablet 0  . pantoprazole (PROTONIX) 40 MG tablet Take 40 mg by mouth 2 (two) times daily.     . potassium chloride (K-DUR) 10 MEQ tablet Take 10 mEq by mouth daily.    . pramipexole (MIRAPEX) 0.5 MG tablet Take 0.5 mg by mouth 2 (two) times daily.     Marland Kitchen telmisartan (MICARDIS) 40 MG tablet Take 40 mg by mouth daily.    . traMADol (ULTRAM) 50 MG tablet Take 100 mg by mouth at bedtime as needed.   3  . traZODone (DESYREL) 150 MG tablet Take 150 mg by mouth at bedtime.     Marland Kitchen zolpidem (AMBIEN) 10 MG tablet Take 0.5 tablets (5 mg total) by mouth at bedtime. 30 tablet 0  . metoCLOPramide (REGLAN) 10 MG tablet Take 10 mg by mouth 3 (three) times daily. (Patient not taking: No sig reported)    . metolazone (ZAROXOLYN) 5 MG tablet Take 5 mg by mouth 2 (two) times a week.     No current facility-administered medications for this visit.    OBJECTIVE: Vitals:   02/02/21 1355  Resp: 20     There is no height or weight on file to calculate BMI.    ECOG FS:2 - Symptomatic, <50% confined to bed  Physical Exam Constitutional:      General: Vital signs are normal.     Appearance: Normal appearance. She is obese.  HENT:     Head: Normocephalic and atraumatic.  Eyes:     Pupils: Pupils are equal, round, and reactive to light.  Cardiovascular:     Rate and Rhythm: Normal rate and regular rhythm.     Heart sounds: Normal heart sounds. No murmur  heard.   Pulmonary:     Effort: Pulmonary  effort is normal.     Breath sounds: Normal breath sounds. No wheezing.  Abdominal:     General: Bowel sounds are normal. There is no distension.     Palpations: Abdomen is soft.     Tenderness: There is no abdominal tenderness.  Musculoskeletal:        General: No edema. Normal range of motion.     Cervical back: Normal range of motion.  Skin:    General: Skin is warm and dry.     Findings: No rash.  Neurological:     Mental Status: She is alert and oriented to person, place, and time.  Psychiatric:        Judgment: Judgment normal.       LAB RESULTS:  Lab Results  Component Value Date   NA 136 08/21/2020   K 3.3 (L) 08/21/2020   CL 91 (L) 08/21/2020   CO2 35 (H) 08/21/2020   GLUCOSE 261 (H) 08/21/2020   BUN 14 08/21/2020   CREATININE 0.72 08/21/2020   CALCIUM 10.0 08/21/2020   PROT 7.4 08/17/2020   ALBUMIN 3.3 (L) 08/21/2020   AST 55 (H) 08/17/2020   ALT 23 08/17/2020   ALKPHOS 96 08/17/2020   BILITOT 0.7 08/17/2020   GFRNONAA >60 08/21/2020   GFRAA >60 08/21/2020    Lab Results  Component Value Date   WBC 10.6 (H) 02/02/2021   NEUTROABS 7.2 02/02/2021   HGB 11.7 (L) 02/02/2021   HCT 37.3 02/02/2021   MCV 87.8 02/02/2021   PLT 195 02/02/2021   Lab Results  Component Value Date   IRON 52 04/28/2020   TIBC 274 04/28/2020   IRONPCTSAT 19 04/28/2020   Lab Results  Component Value Date   FERRITIN 85 04/28/2020     STUDIES: No results found.  ASSESSMENT: Iron deficiency anemia.  PLAN:    Iron deficiency anemia: -Secondary to malabsorption and slow GI bleed? -Unable to tolerate oral iron. -Last received IV Feraheme in March 20 21x2 doses.  -Hemoglobin improved to 13. -Colonoscopy and EGD from 09/20/2016 was normal except for some mild gastritis. -Labs from today show decline in her hemoglobin to 11.7.  Iron levels are pending. -Based on hemoglobin, would recommend 2 doses of IV Feraheme. -Admits to one episode of blood in her stool.  2.  Back  pain:  -Chronic and unchanged.  -Patient has permanent spinal stimulator in place.  3.  Diabetes:  -Patient now has insulin pump.  4.  Persistently cold/hair thinning: -Patient's Synthroid is being adjusted by primary care.  Disposition: -RTC in 1 week for second dose of IV iron. -RTC in 3 months for labs and MD assessment plus possible iron.  Greater than 50% was spent in counseling and coordination of care with this patient including but not limited to discussion of the relevant topics above (See A&P) including, but not limited to diagnosis and management of acute and chronic medical conditions.   Patient expressed understanding and was in agreement with this plan. She also understands that She can call clinic at any time with any questions, concerns, or complaints.    Jacquelin Hawking, NP   02/02/2021 1:58 PM

## 2021-02-09 ENCOUNTER — Inpatient Hospital Stay: Payer: Medicare Other

## 2021-02-09 VITALS — BP 146/72 | HR 62 | Temp 98.4°F | Resp 18

## 2021-02-09 DIAGNOSIS — D509 Iron deficiency anemia, unspecified: Secondary | ICD-10-CM | POA: Diagnosis not present

## 2021-02-09 MED ORDER — SODIUM CHLORIDE 0.9 % IV SOLN
Freq: Once | INTRAVENOUS | Status: AC
  Filled 2021-02-09: qty 250

## 2021-02-09 MED ORDER — SODIUM CHLORIDE 0.9 % IV SOLN
510.0000 mg | Freq: Once | INTRAVENOUS | Status: AC
  Administered 2021-02-09: 510 mg via INTRAVENOUS
  Filled 2021-02-09: qty 510

## 2021-02-09 NOTE — Progress Notes (Signed)
Pt tolerated feraheme infusion well today with no complaints.  Pt left the infusion suite stable in a wheelchair.

## 2021-02-12 ENCOUNTER — Ambulatory Visit
Payer: Medicare Other | Attending: Student in an Organized Health Care Education/Training Program | Admitting: Student in an Organized Health Care Education/Training Program

## 2021-02-12 ENCOUNTER — Other Ambulatory Visit: Payer: Self-pay

## 2021-02-12 ENCOUNTER — Encounter: Payer: Self-pay | Admitting: Student in an Organized Health Care Education/Training Program

## 2021-02-12 VITALS — BP 135/58 | HR 66 | Temp 97.3°F | Ht 66.0 in | Wt 295.0 lb

## 2021-02-12 DIAGNOSIS — M5416 Radiculopathy, lumbar region: Secondary | ICD-10-CM | POA: Diagnosis present

## 2021-02-12 DIAGNOSIS — M1712 Unilateral primary osteoarthritis, left knee: Secondary | ICD-10-CM | POA: Insufficient documentation

## 2021-02-12 DIAGNOSIS — M47816 Spondylosis without myelopathy or radiculopathy, lumbar region: Secondary | ICD-10-CM | POA: Insufficient documentation

## 2021-02-12 DIAGNOSIS — M1711 Unilateral primary osteoarthritis, right knee: Secondary | ICD-10-CM | POA: Insufficient documentation

## 2021-02-12 DIAGNOSIS — G894 Chronic pain syndrome: Secondary | ICD-10-CM | POA: Diagnosis present

## 2021-02-12 DIAGNOSIS — M48061 Spinal stenosis, lumbar region without neurogenic claudication: Secondary | ICD-10-CM | POA: Diagnosis not present

## 2021-02-12 DIAGNOSIS — Z9689 Presence of other specified functional implants: Secondary | ICD-10-CM | POA: Insufficient documentation

## 2021-02-12 DIAGNOSIS — M961 Postlaminectomy syndrome, not elsewhere classified: Secondary | ICD-10-CM

## 2021-02-12 DIAGNOSIS — Z9889 Other specified postprocedural states: Secondary | ICD-10-CM | POA: Insufficient documentation

## 2021-02-12 MED ORDER — OXYCODONE-ACETAMINOPHEN 10-325 MG PO TABS
1.0000 | ORAL_TABLET | Freq: Three times a day (TID) | ORAL | 0 refills | Status: AC | PRN
Start: 2021-02-12 — End: 2021-03-14

## 2021-02-12 NOTE — Progress Notes (Signed)
Nursing Pain Medication Assessment:  Safety precautions to be maintained throughout the outpatient stay will include: orient to surroundings, keep bed in low position, maintain call bell within reach at all times, provide assistance with transfer out of bed and ambulation.  Medication Inspection Compliance: Pill count conducted under aseptic conditions, in front of the patient. Neither the pills nor the bottle was removed from the patient's sight at any time. Once count was completed pills were immediately returned to the patient in their original bottle.  Medication: Oxycodone/APAP Pill/Patch Count: 6 of 60 pills remain Pill/Patch Appearance: Markings consistent with prescribed medication Bottle Appearance: Standard pharmacy container. Clearly labeled. Filled Date: 38 / 59 / 21 Last Medication intake:  TodaySafety precautions to be maintained throughout the outpatient stay will include: orient to surroundings, keep bed in low position, maintain call bell within reach at all times, provide assistance with transfer out of bed and ambulation.

## 2021-02-12 NOTE — Progress Notes (Signed)
PROVIDER NOTE: Information contained herein reflects review and annotations entered in association with encounter. Interpretation of such information and data should be left to medically-trained personnel. Information provided to Brittney can be located elsewhere in the medical record under "Brittney Instructions". Document created using STT-dictation technology, any transcriptional errors that may result from process are unintentional.    Brittney: Brittney [name]  Service Category: E/M  Provider: [name], MD  DOB: 17-Aug-1951  DOS: 02/12/2021  Specialty: Interventional Pain Management  MRN: [id number]  Setting: Ambulatory outpatient  PCP: Rusty Aus, MD  Type: Established Brittney    Referring Provider: Rusty Aus, MD  Location: Office  Delivery: Face-to-face     HPI  Ms. Brittney [name], a 70 y.o. year old female, is here today because of her Facet hypertrophy of lumbar region [M47.816]. Brittney [name] primary complain today is Leg Pain (Right leg) Last encounter: My last encounter with her was on 12/17/2020. 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 Pain Assessment: Severity of Chronic pain is reported as a 7 /10. Location: Leg (back, generalizied pain) Right/pain radiaites downher right leg. Onset: More than a month ago. Quality: Constant,Aching,Tingling. Timing: Constant. Modifying factor(s): meds, heating pad, laying down in the bed. Vitals:  height is '5\' 6"'  (1.676 m) and weight is 295 lb (133.8 kg). Her temperature is 97.3 F (36.3 C) (abnormal). Her blood pressure is 135/58 (abnormal) and her pulse is 66. Her oxygen saturation is 93%.   Reason for encounter: medication management.    No change in medical history since last visit.  Brittney's pain is at baseline.  Brittney continues multimodal pain regimen as prescribed.  States that it provides pain relief and improvement in functional status. Brittney has been able to diurese herself and get fluid off.  She is less edematous today.  Less pitting edema in bilateral lower extremity.  Refill her Percocet as below.   Brittney is not a surgical candidate for knee replacement given her weight and type 2 diabetes. Pharmacotherapy Assessment   Analgesic: Percocet 10 mg twice daily as needed for breakthrough pain. Monitoring: [place] PMP: PDMP reviewed during this encounter.       Pharmacotherapy: No side-effects or adverse reactions reported. Compliance: No problems identified. Effectiveness: Clinically acceptable.  Chauncey Fischer, RN  02/12/2021  2:38 PM  Sign when Signing Visit Nursing Pain Medication Assessment:  Safety precautions to be maintained throughout the outpatient stay will include: orient to surroundings, keep bed in low position, maintain call bell within reach at all times, provide assistance with transfer out of bed and ambulation.  Medication Inspection Compliance: Pill count conducted under aseptic conditions, in front of the  Brittney. Neither  the pills nor the bottle was removed from the Brittney's sight at any time. Once count was completed pills were immediately returned to the Brittney in their original bottle.  Medication: Oxycodone/APAP Pill/Patch Count: 6 of 60 pills remain Pill/Patch Appearance: Markings consistent with prescribed medication Bottle Appearance: Standard pharmacy container. Clearly labeled. Filled Date: 8 / 83 / 21 Last Medication intake:  TodaySafety precautions to be maintained throughout the outpatient stay will include: orient to surroundings, keep bed in low position, maintain call bell within reach at all times, provide assistance with transfer out of bed and ambulation.   Chauncey Fischer, RN  02/12/2021  2:37 PM  Sign when Signing Visit      UDS:  Summary  Date Value Ref Range Status  01/03/2019 FINAL  Final    Comment:    ==================================================================== TOXASSURE SELECT 13 (MW) ==================================================================== Test                             Result       Flag       Units Drug Present and Declared for Prescription Verification   Alprazolam                     165          EXPECTED   ng/mg creat   Alpha-hydroxyalprazolam        412          EXPECTED   ng/mg creat    Source of alprazolam is a scheduled prescription medication.    Alpha-hydroxyalprazolam is an expected metabolite of alprazolam.   Oxycodone                      812          EXPECTED   ng/mg creat   Oxymorphone                    932          EXPECTED   ng/mg creat   Noroxycodone                   1294         EXPECTED   ng/mg creat   Noroxymorphone                 332          EXPECTED   ng/mg creat    Sources of oxycodone are scheduled prescription medications.    Oxymorphone, noroxycodone, and noroxymorphone are expected    metabolites of oxycodone. Oxymorphone is also available as a    scheduled prescription medication.   Tramadol                        2565         EXPECTED   ng/mg creat   O-Desmethyltramadol            7935         EXPECTED   ng/mg creat   N-Desmethyltramadol            2141         EXPECTED   ng/mg creat    Source of tramadol is a prescription medication.    O-desmethyltramadol and N-desmethyltramadol are expected    metabolites of tramadol.   Butalbital  PRESENT      EXPECTED ==================================================================== Test                      Result    Flag   Units      Ref Range   Creatinine              34               mg/dL      >=20 ==================================================================== Declared Medications:  The flagging and interpretation on this report are based on the  following declared medications.  Unexpected results may arise from  inaccuracies in the declared medications.  **Note: The testing scope of this panel includes these medications:  Alprazolam  Butalbital (Butalbital/APAP/Caffeine)  Oxycodone (Percocet)  Tramadol (Ultram)  **Note: The testing scope of this panel does not include following  reported medications:  Acetaminophen (Butalbital/APAP/Caffeine)  Acetaminophen (Percocet)  Albuterol  Bumetanide  Caffeine (Butalbital/APAP/Caffeine)  Candesartan  Carvedilol  Clonidine  Cyclobenzaprine (Flexeril)  Doxazosin (Cardura)  Escitalopram (Lexapro)  Fluticasone (Flonase)  Fluticasone (Flovent)  Gabapentin (Neurontin)  Hydralazine (Apresoline)  Ibuprofen (Advil)  Insulin (Humulin)  Insulin (Levemir)  Lactulose  Levothyroxine  Lubiprostone (Amitiza)  Metoclopramide (Reglan)  Pantoprazole (Protonix)  Potassium  Pramipexole (Mirapex)  Sucralfate (Carafate)  Topical Diclofenac (Voltaren)  Trazodone (Desyrel)  Zolpidem (Ambien) ==================================================================== For clinical consultation, please call [phone]. ====================================================================      ROS  Constitutional: Denies any fever or chills Gastrointestinal: No reported hemesis, hematochezia, vomiting, or acute GI distress Musculoskeletal: Low back, bilateral hip, bilateral knee pain Neurological: No reported episodes of acute onset apraxia, aphasia, dysarthria, agnosia, amnesia, paralysis, loss of coordination, or loss of consciousness  Medication Review  ALPRAZolam, albuterol, budesonide, butalbital-acetaminophen-caffeine, carvedilol, cloNIDine, cyclobenzaprine, doxazosin, escitalopram, fluticasone, furosemide, gabapentin, hydrALAZINE, ibuprofen, insulin pump, insulin regular human CONCENTRATED, ipratropium-albuterol, levothyroxine, linaclotide, metoCLOPramide, metolazone, ondansetron, oxyCODONE-acetaminophen, pantoprazole, potassium chloride, pramipexole, telmisartan, traMADol, traZODone, and zolpidem  History Review  Allergy: Brittney [name] is allergic to shellfish allergy, amlodipine, codeine, imdur [isosorbide dinitrate], lyrica [pregabalin], monosodium glutamate, and tape. Drug: Brittney [name]  reports no history of drug use. Alcohol:  reports no history of alcohol use. Tobacco:  reports that she has never smoked. She has never used smokeless tobacco. Social: Brittney [name]  reports that she has never smoked. She has never used smokeless tobacco. She reports that she does not drink alcohol and does not use drugs. Medical:  has a past medical history of Abdominal pain, left upper quadrant (12/11/2012), Arthritis, Asthma, Broken leg (2014), Cancer ([place]) (2007), Chronic kidney disease, stage 2 (mild), Chronic lower back pain, Collagen vascular disease ([place]), Coronary artery disease, Diabetes mellitus without complication ([place]), Diabetic nephropathy associated with secondary diabetes mellitus ([place]), [place] (12/11/2012), Gastroparesis, Gross hematuria (12/11/2012), Hepatic cirrhosis ([place]), History of kidney  stones, Hyperlipidemia, Hypertension, Hypothyroidism, Hypothyroidism, IBS (irritable bowel syndrome), Kidney stone (12/11/2012), Lower extremity edema, Morbid obesity with BMI of 40.0-44.9, adult ([place]), Myocardial infarction ([place]) (09/2013), Nausea without vomiting (12/11/2012), Nephrolithiasis, Nephrolithiasis (04/26/2014), Nonproliferative retinopathy due to secondary diabetes ([place]), Numbness and tingling of right leg, Peripheral vascular disease ([place]), Renal colic (54/62/7035), Renal insufficiency, Sciatica, Sciatica (12/11/2012), Skin cancer (08/2018), Sleep apnea, Stroke ([place]), and Unilateral small kidney without contralateral hypertrophy. Surgical: Brittney [name]  has a past surgical history that includes Cesarean section (1980); Dilation and curettage of uterus; Eye surgery (1986); Abdominal hysterectomy; Tonsillectomy; Esophagogastroduodenoscopy (02/08/2014); Colonoscopy (05/04/2001); Esophagogastroduodenoscopy (egd) with propofol (N/A, 09/20/2016); Colonoscopy with propofol (N/A, 09/20/2016); Mohs surgery (11/2016); Cholecystectomy; Hernia repair;  Joint replacement (Left, 09/24/2013); Back surgery (04/1999); Pulse generator implant (N/A, 11/02/2017); Esophagogastroduodenoscopy (egd) with propofol (N/A, 05/23/2018); and Esophagogastroduodenoscopy (egd) with propofol (N/A, 07/18/2020). Family: family history includes Asthma in her mother; Cancer in her mother; Diabetes in her father; Heart disease in her mother; Hypertension in her father; Kidney disease in her father.  Laboratory Chemistry Profile   Renal Lab Results  Component Value Date   BUN 14 08/21/2020   CREATININE 0.72 08/21/2020   GFRAA >60 08/21/2020   GFRNONAA >60 08/21/2020     Hepatic Lab Results  Component Value Date   AST 55 (H) 08/17/2020   ALT 23 08/17/2020   ALBUMIN 3.3 (L) 08/21/2020   ALKPHOS 96 08/17/2020   LIPASE 114 10/22/2014     Electrolytes Lab Results  Component Value Date   NA 136 08/21/2020   K 3.3 (L) 08/21/2020    CL 91 (L) 08/21/2020   CALCIUM 10.0 08/21/2020   MG 1.8 08/21/2020   PHOS 3.0 08/21/2020     Bone Lab Results  Component Value Date   25OHVITD1 26 (L) 12/07/2016   25OHVITD2 1.2 12/07/2016   25OHVITD3 25 12/07/2016     Inflammation (CRP: Acute Phase) (ESR: Chronic Phase) Lab Results  Component Value Date   CRP 1.2 (H) 12/07/2016   ESRSEDRATE 45 (H) 08/18/2020   LATICACIDVEN 1.5 08/17/2020       Note: Above Lab results reviewed.  Recent Imaging Review  ECHOCARDIOGRAM COMPLETE    ECHOCARDIOGRAM REPORT       Brittney [name]:   Brittney [name] Date of Exam: 08/18/2020 Medical Rec #:  [id number]        Height:       66.0 in Accession #:    [id number]       Weight:       319.7 lb Date of Birth:  1951-03-30         BSA:          2.441 m Brittney Age:    72 years         BP:           135/61 mmHg Brittney Gender: F                HR:           72 bpm. Exam Location:  [facility]  Procedure: 2D Echo, Color Doppler and Cardiac Doppler  Indications:     Syncope 780.2 / R55   History:         Brittney has no prior history of Echocardiogram examinations.                  CAD; Risk Factors:Hypertension. MI.   Sonographer:     Alyse Low Roar Referring Phys:  [id number] Harold Hedge Diagnosing Phys: Nelva Bush MD    Sonographer Comments: Technically difficult study due to poor echo windows. IMPRESSIONS   1. Left ventricular ejection fraction, by estimation, is >55%. The left ventricle has normal function. Left ventricular endocardial border not optimally defined to evaluate regional wall motion. There is moderate left ventricular hypertrophy. Left  ventricular diastolic parameters are consistent with Grade I diastolic dysfunction (impaired relaxation).  2. Right ventricular systolic function is normal. The right ventricular size is not well visualized. Tricuspid regurgitation signal is inadequate for assessing PA pressure.  3. The mitral valve is grossly normal. No evidence of mitral  valve regurgitation. No evidence of mitral stenosis.  4. The aortic valve was not well visualized. Aortic valve  regurgitation is not visualized. No aortic stenosis is present.  FINDINGS  Left Ventricle: Left ventricular ejection fraction, by estimation, is >55%. The left ventricle has normal function. Left ventricular endocardial border not optimally defined to evaluate regional wall motion. The left ventricular internal cavity size was  normal in size. There is moderate left ventricular hypertrophy. Left ventricular diastolic parameters are consistent with Grade I diastolic dysfunction (impaired relaxation).  Right Ventricle: The right ventricular size is not well visualized. No increase in right ventricular wall thickness. Right ventricular systolic function is normal. Tricuspid regurgitation signal is inadequate for assessing PA pressure.  Left Atrium: Left atrial size was normal in size.  Right Atrium: Right atrial size was normal in size.  Pericardium: The pericardium was not well visualized. Presence of pericardial fat pad.  Mitral Valve: The mitral valve is grossly normal. No evidence of mitral valve regurgitation. No evidence of mitral valve stenosis.  Tricuspid Valve: The tricuspid valve is not well visualized. Tricuspid valve regurgitation is not demonstrated.  Aortic Valve: The aortic valve was not well visualized. Aortic valve regurgitation is not visualized. No aortic stenosis is present. Aortic valve peak gradient measures 12.7 mmHg.  Pulmonic Valve: The pulmonic valve was not well visualized. Pulmonic valve regurgitation is not visualized. No evidence of pulmonic stenosis.  Aorta: The aortic root was not well visualized.  Pulmonary Artery: The pulmonary artery is not well seen.  Venous: The inferior vena cava was not well visualized.  IAS/Shunts: The interatrial septum was not well visualized.    LEFT VENTRICLE PLAX 2D LVIDd:         5.05 cm  Diastology LVIDs:          3.50 cm  LV e' lateral:   8.92 cm/s LV PW:         1.22 cm  LV E/e' lateral: 9.1 LV IVS:        1.34 cm  LV e' medial:    7.62 cm/s LVOT diam:     1.90 cm  LV E/e' medial:  10.7 LVOT Area:     2.84 cm    RIGHT VENTRICLE RV Mid diam:    3.31 cm RV S prime:     14.70 cm/s  LEFT ATRIUM             Index       RIGHT ATRIUM           Index LA diam:        4.00 cm 1.64 cm/m  RA Area:     17.30 cm LA Vol (A2C):   57.1 ml 23.39 ml/m RA Volume:   45.50 ml  18.64 ml/m LA Vol (A4C):   43.2 ml 17.70 ml/m LA Biplane Vol: 49.8 ml 20.40 ml/m  AORTIC VALVE                PULMONIC VALVE AV Area (Vmax): 1.83 cm    PV Vmax:        1.28 m/s AV Vmax:        178.00 cm/s PV Peak grad:   6.6 mmHg AV Peak Grad:   12.7 mmHg   RVOT Peak grad: 4 mmHg LVOT Vmax:      115.00 cm/s    MITRAL VALVE MV Area (PHT): 6.12 cm    SHUNTS MV Decel Time: 124 msec    Systemic Diam: 1.90 cm MV E velocity: 81.40 cm/s MV A velocity: 97.30 cm/s MV E/A ratio:  0.84 MV A Prime:    9.7  cm/s  Nelva Bush MD Electronically signed by Nelva Bush MD Signature Date/Time: 08/19/2020/3:27:56 PM      Final   Note: Reviewed        Physical Exam  General appearance: Well nourished, well developed, and well hydrated. In no apparent acute distress Mental status: Alert, oriented x 3 (person, place, & time)       Respiratory: No evidence of acute respiratory distress Eyes: PERLA Vitals: BP (!) 135/58   Pulse 66   Temp (!) 97.3 F (36.3 C)   Ht '5\' 6"'  (1.676 m)   Wt 295 lb (133.8 kg)   SpO2 93%   BMI 47.61 kg/m  BMI: Estimated body mass index is 47.61 kg/m as calculated from the following:   Height as of this encounter: '5\' 6"'  (1.676 m).   Weight as of this encounter: 295 lb (133.8 kg). Ideal: Ideal body weight: 59.3 kg (130 lb 11.7 oz) Adjusted ideal body weight: 89.1 kg (196 lb 7 oz)   Lumbar Spine Area Exam  Skin & Axial Inspection:Well healed scar from previous spine surgery detected, IPG present  from spinal cord stimulator Alignment:Symmetrical Functional XYI:AXKP restricted ROM Stability:No instability detected Muscle Tone/Strength:Functionally intact. No obvious neuro-muscular anomalies detected. Sensory (Neurological):Dermatomal pain pattern and neurogenic  Gait & Posture Assessment  Ambulation:Brittney came in today in a wheel chair Gait:Limited. Using assistive device to ambulate Posture:Difficulty standing up straight, due to pain Lower Extremity Exam    Side:Right lower extremity  Side:Left lower extremity  Stability:No instability observed  Stability:No instability observed  Skin & Extremity Inspection:Pitting edema improved from before  Skin & Extremity Inspection:Pitting edema, improved from before  Functional VVZ:SMOL restricted ROMfor all joints of the lower extremity   Functional MBE:MLJQ restricted ROMfor all joints of the lower extremity   Muscle Tone/Strength:Moderate-to-severe deconditioning, generalized lower extremity weakness  Muscle Tone/Strength:Moderate-to-severe deconditioninggeneralized lower extremity weakness  Sensory (Neurological):Neurogenic pain pattern  Sensory (Neurological):Neurogenic pain pattern              Assessment   Status Diagnosis  Controlled Controlled Controlled 1. L3-4 severe lumbar facet hypertrophy and spinal stenosis   2. L3-4 severe lumbar spinal stenosis (12/31/2016 MRI)   3. Primary osteoarthritis of left knee   4. Failed back surgical syndrome   5. Chronic pain syndrome   6. Spinal cord stimulator status ([place])   7. Status post lumbar spine surgery for decompression of spinal cord   8. Primary osteoarthritis of right knee        Plan of Care   Brittney [name] has a current medication list which includes the following long-term medication(s): albuterol, carvedilol, doxazosin, escitalopram, fluticasone, gabapentin,  hydralazine, insulin pump, insulin regular human concentrated, ipratropium-albuterol, metoclopramide, metolazone, pantoprazole, potassium chloride, pramipexole, telmisartan, tramadol, trazodone, and zolpidem.  Pharmacotherapy (Medications Ordered): Meds ordered this encounter  Medications  . oxyCODONE-acetaminophen (PERCOCET) 10-325 MG tablet    Sig: Take 1 tablet by mouth every 8 (eight) hours as needed for pain. Must last 30 days.    Dispense:  90 tablet    Refill:  0    Chronic Pain. (STOP Act - Not applicable). Fill one day early if closed on scheduled refill date.    Follow-up plan:   Return in about 8 weeks (around 04/09/2021) for Medication Management, in person.     s/p GNB (R) 07/18/19-helpful, provided greater than 50% pain relief for 5 days however Brittney did sustain a fall due to UTI associated sepsis did spend 3 days in the  hospital.  Prior to her fall she was obtaining significant pain relief with genicular nerve block.  Status post right genicular RFA on 08/29/2019.  Left GN RFA on 02/12/19.          Recent Visits Date Type Provider Dept  12/17/20 Office Visit [name], MD [facility]  Showing recent visits within past 90 days and meeting all other requirements Today's Visits Date Type Provider Dept  02/12/21 Office Visit [name], MD [facility]  Showing today's visits and meeting all other requirements Future Appointments Date Type Provider Dept  04/09/21 Appointment [name], MD [facility]  Showing future appointments within next 90 days and meeting all other requirements  I discussed the assessment and treatment plan with the Brittney. The Brittney was provided an opportunity to ask questions and all were answered. The Brittney agreed with the plan and demonstrated an understanding of the instructions.  Brittney advised to call back or seek an in-person evaluation if the symptoms or condition worsens.  Duration of encounter: 30  minutes.  Note by: [name], MD Date: 02/12/2021; Time: 3:09 PM

## 2021-02-16 LAB — TOXASSURE SELECT 13 (MW), URINE

## 2021-04-02 ENCOUNTER — Ambulatory Visit
Admission: RE | Admit: 2021-04-02 | Discharge: 2021-04-02 | Disposition: A | Discharge status: Home / Self Care | Payer: Medicare Other | Source: Ambulatory Visit | Attending: Gastroenterology | Admitting: Gastroenterology

## 2021-04-02 ENCOUNTER — Other Ambulatory Visit: Payer: Self-pay

## 2021-04-02 DIAGNOSIS — K7460 Unspecified cirrhosis of liver: Secondary | ICD-10-CM | POA: Diagnosis not present

## 2021-04-09 ENCOUNTER — Other Ambulatory Visit: Payer: Self-pay

## 2021-04-09 ENCOUNTER — Ambulatory Visit
Payer: Medicare Other | Attending: Student in an Organized Health Care Education/Training Program | Admitting: Student in an Organized Health Care Education/Training Program

## 2021-04-09 ENCOUNTER — Encounter: Payer: Self-pay | Admitting: Student in an Organized Health Care Education/Training Program

## 2021-04-09 VITALS — BP 161/60 | HR 99 | Temp 98.8°F | Ht 66.0 in | Wt 295.0 lb

## 2021-04-09 DIAGNOSIS — Z9889 Other specified postprocedural states: Secondary | ICD-10-CM

## 2021-04-09 DIAGNOSIS — M5416 Radiculopathy, lumbar region: Secondary | ICD-10-CM

## 2021-04-09 DIAGNOSIS — M797 Fibromyalgia: Secondary | ICD-10-CM

## 2021-04-09 DIAGNOSIS — M4804 Spinal stenosis, thoracic region: Secondary | ICD-10-CM | POA: Diagnosis not present

## 2021-04-09 DIAGNOSIS — M47816 Spondylosis without myelopathy or radiculopathy, lumbar region: Secondary | ICD-10-CM

## 2021-04-09 DIAGNOSIS — M48061 Spinal stenosis, lumbar region without neurogenic claudication: Secondary | ICD-10-CM

## 2021-04-09 DIAGNOSIS — Z9689 Presence of other specified functional implants: Secondary | ICD-10-CM

## 2021-04-09 DIAGNOSIS — M961 Postlaminectomy syndrome, not elsewhere classified: Secondary | ICD-10-CM

## 2021-04-09 DIAGNOSIS — M7918 Myalgia, other site: Secondary | ICD-10-CM

## 2021-04-09 MED ORDER — OXYCODONE-ACETAMINOPHEN 10-325 MG PO TABS: 1.0000 | ORAL_TABLET | Freq: Three times a day (TID) | ORAL | 0 refills | Status: DC | PRN

## 2021-04-09 NOTE — Progress Notes (Signed)
Nursing Pain Medication Assessment:  Safety precautions to be maintained throughout the outpatient stay will include: orient to surroundings, keep bed in low position, maintain call bell within reach at all times, provide assistance with transfer out of bed and ambulation.  Medication Inspection Compliance: Pill count conducted under aseptic conditions, in front of the patient. Neither the pills nor the bottle was removed from the patient's sight at any time. Once count was completed pills were immediately returned to the patient in their original bottle.  Medication: Oxycodone/APAP Pill/Patch Count: 9 1/2 of 90 pills remain Pill/Patch Appearance: Markings consistent with prescribed medication Bottle Appearance: Standard pharmacy container. Clearly labeled. Filled Date: 2 / 39 / 22 Last Medication intake:  TodaySafety precautions to be maintained throughout the outpatient stay will include: orient to surroundings, keep bed in low position, maintain call bell within reach at all times, provide assistance with transfer out of bed and ambulation.

## 2021-04-09 NOTE — Progress Notes (Signed)
PROVIDER NOTE: Information contained herein reflects review and annotations entered in association with encounter. Interpretation of such information and data should be left to medically-trained personnel. Information provided to patient can be located elsewhere in the medical record under "Patient Instructions". Document created using STT-dictation technology, any transcriptional errors that may result from process are unintentional.    Patient: Brittney [name]  Service Category: E/M  Provider: [name], MD  DOB: 08-05-1951  DOS: 04/09/2021  Specialty: Interventional Pain Management  MRN: [id number]  Setting: Ambulatory outpatient  PCP: Rusty Aus, MD  Type: Established Patient    Referring Provider: Rusty Aus, MD  Location: Office  Delivery: Face-to-face     HPI  Brittney [name], a 70 y.o. year old female, is here today because of her Myofascial pain [M79.18]. Brittney [name] primary complain today is Back Pain Last encounter: My last encounter with her was on 02/12/2021. 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 Pain Assessment: Severity of Chronic pain is reported as a 6 /10. Location: Back Right,Left,Lower/pain radiaties down her legs to her ankle, right leg become numb. Onset: More than a month ago. Quality: Aching,Sharp,Radiating,Discomfort,Constant,Burning. Timing: Constant. Modifying factor(s): meds, laying, heat. Vitals:  height is _0  (1.676 m) and weight is 295 lb (133.8 kg). Her temperature is 98.8 F (37.1 C). Her blood pressure is 161/60 (abnormal) and her pulse is 99. Her oxygen saturation is 98%.   Reason for encounter: medication management.    Brittney [name] presents today for medication management.  Since her last clinic visit, she has lost weight by having a reduction in her lower extremity edema as well as limiting her dietary intake.  She is doing better.  She is endorsing increased myofascial pain along her trapezius and cervical paraspinal region.  Of note she had a trigger point injection done in this region approximately 2 years ago which was helpful for this myofascial exacerbation.  Patient is requesting a repeat trigger point injection in this area.  I will refill her Percocet as below.  Quantity 90 usually last 2 months, she utilizes approximately 45 tablets a month for breakthrough pain.  She has been counseled at length about the risk of respiratory depression with multiple sedative medications.  Please see previous clinic notes.  I will refill her Percocet as below, no change in dose.  She was counseled regarding safe medication intake and not taking her medications with benzodiazepines or other sedative medications if she can avoid it.  We will also get her scheduled for a trigger point injection in her cervical paraspinal and trapezius region.  Pharmacotherapy Assessment   Analgesic: Percocet 10 mg twice daily as needed for breakthrough pain.  Monitoring: [place] PMP: PDMP  reviewed during this encounter.       Pharmacotherapy: No side-effects or adverse reactions reported. Compliance: No problems identified. Effectiveness: Clinically acceptable.  Brittney Fischer, Brittney [name]  04/09/2021  3:02 PM  Sign when Signing Visit Nursing Pain Medication Assessment:  Safety precautions to be maintained throughout the outpatient stay will include: orient to surroundings, keep bed in low position, maintain call bell within reach at all times, provide assistance with transfer out of bed and ambulation.  Medication Inspection Compliance: Pill count conducted under aseptic conditions, in front of the patient. Neither the pills nor the bottle was removed from the patient's sight at any time. Once count was completed pills were immediately returned to the patient in their original bottle.  Medication: Oxycodone/APAP Pill/Patch Count: 9 1/2 of 90 pills remain Pill/Patch Appearance: Markings consistent with prescribed medication Bottle Appearance: Standard pharmacy container. Clearly labeled. Filled Date: 2 / 84 / 22 Last Medication intake:  TodaySafety precautions to be maintained throughout the outpatient stay will include: orient to surroundings, keep bed in low position, maintain call bell within reach at all times, provide assistance with transfer out of bed and ambulation.     UDS:  Summary  Date Value Ref Range Status  02/12/2021 Note  Final    Comment:    ==================================================================== [employer] [facility] (MW) ==================================================================== Test                             Result       Flag       Units  Drug Present and Declared for Prescription Verification   Alprazolam                     382          EXPECTED   ng/mg creat   Alpha-hydroxyalprazolam        1128         EXPECTED   ng/mg creat    Source of alprazolam is a scheduled prescription medication. Alpha-    hydroxyalprazolam is an expected  metabolite of alprazolam.    Oxycodone                      1151         EXPECTED   ng/mg creat   Oxymorphone                    889          EXPECTED   ng/mg creat   Noroxycodone                   1083         EXPECTED   ng/mg creat   Noroxymorphone                 323          EXPECTED   ng/mg creat    Sources of oxycodone are scheduled prescription medications.    Oxymorphone, noroxycodone, and noroxymorphone are expected    metabolites of oxycodone. Oxymorphone is also available as a    scheduled prescription medication.    Tramadol                       >3311        EXPECTED   ng/mg creat   O-Desmethyltramadol            >  3311        EXPECTED   ng/mg creat   N-Desmethyltramadol            1035         EXPECTED   ng/mg creat    Source of tramadol is a prescription medication. O-desmethyltramadol    and N-desmethyltramadol are expected metabolites of tramadol.  Drug Absent but Declared for Prescription Verification   Butalbital                     Not Detected UNEXPECTED ==================================================================== Test                      Result    Flag   Units      Ref Range   Creatinine              151              mg/dL      >=20 ==================================================================== Declared Medications:  The flagging and interpretation on this report are based on the  following declared medications.  Unexpected results may arise from  inaccuracies in the declared medications.   **Note: The testing scope of this panel includes these medications:   Alprazolam (Xanax)  Butalbital (Fioricet)  Oxycodone (Percocet)  Tramadol (Ultram)   **Note: The testing scope of this panel does not include the  following reported medications:   Acetaminophen (Fioricet)  Acetaminophen (Percocet)  Albuterol (Ventolin HFA)  Albuterol (Duoneb)  Budesonide (Pulmicort)  Caffeine (Fioricet)  Carvedilol (Coreg)  Clonidine (Catapres)  Cyclobenzaprine  (Flexeril)  Doxazosin (Cardura)  Escitalopram (Lexapro)  Fluticasone (Flonase)  Furosemide (Lasix)  Gabapentin (Neurontin)  Hydralazine (Apresoline)  Ibuprofen (Advil)  Insulin (Humulin)  Ipratropium (Duoneb)  Levothyroxine (Synthroid)  Linaclotide (Linzess)  Metoclopramide (Reglan)  Metolazone (Zaroxolyn)  Ondansetron (Zofran)  Pantoprazole (Protonix)  Potassium (Klor-Con)  Pramipexole (Mirapex)  Telmisartan (Micardis)  Trazodone (Desyrel)  Zolpidem (Ambien) ==================================================================== For clinical consultation, please call [phone]. ====================================================================      ROS  Constitutional: Denies any fever or chills Gastrointestinal: No reported hemesis, hematochezia, vomiting, or acute GI distress Musculoskeletal: Denies any acute onset joint swelling, redness, loss of ROM, or weakness Neurological: No reported episodes of acute onset apraxia, aphasia, dysarthria, agnosia, amnesia, paralysis, loss of coordination, or loss of consciousness  Medication Review  ALPRAZolam, albuterol, budesonide, butalbital-acetaminophen-caffeine, carvedilol, cloNIDine, cyclobenzaprine, doxazosin, escitalopram, fluticasone, furosemide, gabapentin, hydrALAZINE, ibuprofen, insulin pump, insulin regular human CONCENTRATED, ipratropium-albuterol, levothyroxine, linaclotide, metoCLOPramide, metolazone, ondansetron, oxyCODONE-acetaminophen, pantoprazole, potassium chloride, pramipexole, telmisartan, traMADol, traZODone, and zolpidem  History Review  Allergy: Brittney [name] is allergic to shellfish allergy, amlodipine, codeine, imdur [isosorbide dinitrate], lyrica [pregabalin], monosodium glutamate, and tape. Drug: Brittney [name]  reports no history of drug use. Alcohol:  reports no history of alcohol use. Tobacco:  reports that she has never smoked. She has never used smokeless tobacco. Social: Brittney [name]  reports that  she has never smoked. She has never used smokeless tobacco. She reports that she does not drink alcohol and does not use drugs. Medical:  has a past medical history of Abdominal pain, left upper quadrant (12/11/2012), Arthritis, Asthma, Broken leg (2014), Cancer ([place]) (2007), Chronic kidney disease, stage 2 (mild), Chronic lower back pain, Collagen vascular disease ([place]), Coronary artery disease, Diabetes mellitus without complication ([place]), Diabetic nephropathy associated with secondary diabetes mellitus ([place]), [place] (12/11/2012), Gastroparesis, Gross hematuria (12/11/2012), Hepatic cirrhosis ([place]), History of kidney stones, Hyperlipidemia, Hypertension, Hypothyroidism, Hypothyroidism, IBS (irritable bowel syndrome), Kidney stone (12/11/2012),  Lower extremity edema, Morbid obesity with BMI of 40.0-44.9, adult ([place]), Myocardial infarction ([place]) (09/2013), Nausea without vomiting (12/11/2012), Nephrolithiasis, Nephrolithiasis (04/26/2014), Nonproliferative retinopathy due to secondary diabetes ([place]), Numbness and tingling of right leg, Peripheral vascular disease ([place]), Renal colic (16/09/9603), Renal insufficiency, Sciatica, Sciatica (12/11/2012), Skin cancer (08/2018), Sleep apnea, Stroke ([place]), and Unilateral small kidney without contralateral hypertrophy. Surgical: Brittney [name]  has a past surgical history that includes Cesarean section (1980); Dilation and curettage of uterus; Eye surgery (1986); Abdominal hysterectomy; Tonsillectomy; Esophagogastroduodenoscopy (02/08/2014); Colonoscopy (05/04/2001); Esophagogastroduodenoscopy (egd) with propofol (N/A, 09/20/2016); Colonoscopy with propofol (N/A, 09/20/2016); Mohs surgery (11/2016); Cholecystectomy; Hernia repair; Joint replacement (Left, 09/24/2013); Back surgery (04/1999); Pulse generator implant (N/A, 11/02/2017); Esophagogastroduodenoscopy (egd) with propofol (N/A, 05/23/2018); and Esophagogastroduodenoscopy (egd) with propofol (N/A, 07/18/2020). Family:  family history includes Asthma in her mother; Cancer in her mother; Diabetes in her father; Heart disease in her mother; Hypertension in her father; Kidney disease in her father.  Laboratory Chemistry Profile   Renal Lab Results  Component Value Date   BUN 14 08/21/2020   CREATININE 0.72 08/21/2020   GFRAA >60 08/21/2020   GFRNONAA >60 08/21/2020     Hepatic Lab Results  Component Value Date   AST 55 (H) 08/17/2020   ALT 23 08/17/2020   ALBUMIN 3.3 (L) 08/21/2020   ALKPHOS 96 08/17/2020   LIPASE 114 10/22/2014     Electrolytes Lab Results  Component Value Date   NA 136 08/21/2020   K 3.3 (L) 08/21/2020   CL 91 (L) 08/21/2020   CALCIUM 10.0 08/21/2020   MG 1.8 08/21/2020   PHOS 3.0 08/21/2020     Bone Lab Results  Component Value Date   25OHVITD1 26 (L) 12/07/2016   25OHVITD2 1.2 12/07/2016   25OHVITD3 25 12/07/2016     Inflammation (CRP: Acute Phase) (ESR: Chronic Phase) Lab Results  Component Value Date   CRP 1.2 (H) 12/07/2016   ESRSEDRATE 45 (H) 08/18/2020   LATICACIDVEN 1.5 08/17/2020       Note: Above Lab results reviewed.  Recent Imaging Review  US Abdomen Limited RUQ (LIVER/GB) CLINICAL DATA:  Cirrhosis without ascites.  EXAM: ULTRASOUND ABDOMEN LIMITED RIGHT UPPER QUADRANT  COMPARISON:  None.  FINDINGS: Gallbladder:  Surgically absent  Common bile duct:  Diameter: 8 mm  Liver:  Coarsened echotexture with no focal mass. Portal vein is patent on color Doppler imaging with normal direction of blood flow towards the liver.  Other: None.  IMPRESSION: 1. Previous cholecystectomy. 2. The common bile duct is mildly dilated, to 8 mm, likely from previous cholecystectomy. 3. Coarsened echotexture is nonspecific but likely due to the reported cirrhosis.  Electronically Signed   By: Dorise Bullion III M.D   On: 04/04/2021 12:57 Note: Reviewed        Physical Exam  General appearance: Well nourished, well developed, and well  hydrated. In no apparent acute distress Mental status: Alert, oriented x 3 (person, place, & time)       Respiratory: No evidence of acute respiratory distress Eyes: PERLA Vitals: BP (!) 161/60   Pulse 99   Temp 98.8 F (37.1 C)   Ht _0  (1.676 m)   Wt 295 lb (133.8 kg)   SpO2 98%   BMI 47.61 kg/m  BMI: Estimated body mass index is 47.61 kg/m as calculated from the following:   Height as of this encounter: _1  (1.676 m).   Weight as of this encounter: 295 lb (133.8 kg). Ideal: Ideal body weight: 59.3 kg (  130 lb 11.7 oz) Adjusted ideal body weight: 89.1 kg (196 lb 7 oz)    Lumbar Spine Area Exam  Skin & Axial Inspection:Well healed scar from previous spine surgery detected, IPG present from spinal cord stimulator Alignment:Symmetrical Functional XMI:WOEH restricted ROM Stability:No instability detected Muscle Tone/Strength:Functionally intact. No obvious neuro-muscular anomalies detected. Sensory (Neurological):Dermatomal pain pattern and neurogenic  Gait & Posture Assessment  Ambulation:Patient came in today in a wheel chair Gait:Limited. Using assistive device to ambulate Posture:Difficulty standing up straight, due to pain Lower Extremity Exam    Side:Right lower extremity  Side:Left lower extremity  Stability:No instability observed  Stability:No instability observed  Skin & Extremity Inspection:Pitting edema improved from before  Skin & Extremity Inspection:Pitting edema, improved from before  Functional OZY:YQMG restricted ROMfor all joints of the lower extremity   Functional NOI:BBCW restricted ROMfor all joints of the lower extremity   Muscle Tone/Strength:Moderate-to-severe deconditioning, generalized lower extremity weakness  Muscle Tone/Strength:Moderate-to-severe deconditioninggeneralized lower extremity weakness  Sensory (Neurological):Neurogenic pain pattern  Sensory  (Neurological):Neurogenic pain pattern            Assessment   Status Diagnosis  Having a Flare-up Controlled Controlled 1. Myofascial pain   2. Fibromyalgia   3. Spinal stenosis of thoracic region   4. 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 Failed back surgical syndrome   6. L3-4 severe lumbar facet hypertrophy and spinal stenosis   7. L3-4 severe lumbar spinal stenosis (12/31/2016 MRI)   8. Spinal cord stimulator status ([place])      Plan of Care   Brittney [name] has a current medication list which includes the following long-term medication(s): albuterol, doxazosin, escitalopram, fluticasone, gabapentin, hydralazine, insulin pump, ipratropium-albuterol, pantoprazole, potassium chloride, pramipexole, telmisartan, tramadol, trazodone, zolpidem, carvedilol, insulin regular human concentrated, metoclopramide, and metolazone.  Pharmacotherapy (Medications Ordered): Meds ordered this encounter  Medications  . oxyCODONE-acetaminophen (PERCOCET) 10-325 MG tablet    Sig: Take 1 tablet by mouth every 8 (eight) hours as needed for pain.    Dispense:  90 tablet    Refill:  0  . oxyCODONE-acetaminophen (PERCOCET) 10-325 MG tablet    Sig: Take 1 tablet by mouth every 8 (eight) hours as needed for pain.    Dispense:  90 tablet    Refill:  0   Orders:  Orders Placed This Encounter  Procedures  . TRIGGER POINT INJECTION    Standing Status:   Future    Standing Expiration Date:   07/09/2021    Scheduling Instructions:     Trapezius, cervical paraspinals    Order Specific Question:   Where will this procedure be performed?    Answer:   [facility]   Follow-up plan:   Return in about 4 months (around 08/09/2021) for Medication Management, in person.     s/p GNB (R) 07/18/19-helpful, provided greater than 50% pain relief for 5 days however patient did sustain a fall due to UTI associated sepsis did spend 3 days in the  hospital.  Prior to her fall she was obtaining significant pain relief with genicular nerve block.  Status post right genicular RFA on 08/29/2019.  Left GN RFA on 02/12/19.           Recent Visits Date Type Provider Dept  02/12/21 Office Visit [name], MD [facility]  Showing recent visits within past 90 days and meeting all other requirements Today's Visits Date Type Provider Dept  04/09/21 Office Visit [name], MD  [facility]  Showing today's visits and meeting all other requirements Future Appointments No visits were found meeting these conditions. Showing future appointments within next 90 days and meeting all other requirements  I discussed the assessment and treatment plan with the patient. The patient was provided an opportunity to ask questions and all were answered. The patient agreed with the plan and demonstrated an understanding of the instructions.  Patient advised to call back or seek an in-person evaluation if the symptoms or condition worsens.  Duration of encounter: 72mnutes.  Note by: BGillis Santa MD Date: 04/09/2021; Time: 3:42 PM

## 2021-04-15 ENCOUNTER — Telehealth: Payer: Self-pay

## 2021-04-15 NOTE — Telephone Encounter (Signed)
Dr. Holley Raring was supposed to call in her meds and the pharmacy states they dont have any there for her.

## 2021-04-16 MED ORDER — OXYCODONE-ACETAMINOPHEN 10-325 MG PO TABS: 1.0000 | ORAL_TABLET | Freq: Three times a day (TID) | ORAL | 0 refills | Status: DC | PRN

## 2021-04-16 MED ORDER — OXYCODONE-ACETAMINOPHEN 10-325 MG PO TABS: 1.0000 | ORAL_TABLET | Freq: Three times a day (TID) | ORAL | 0 refills | Status: AC | PRN

## 2021-04-16 NOTE — Telephone Encounter (Signed)
Patient notified prescriptions have been resent to pharmacy.

## 2021-04-16 NOTE — Telephone Encounter (Signed)
Prescriptions for Oxycodone were sent to pharmacy. Attempted to call patient, mailbox full.

## 2021-04-16 NOTE — Telephone Encounter (Signed)
Requested Prescriptions   Signed Prescriptions Disp Refills  . oxyCODONE-acetaminophen (PERCOCET) 10-325 MG tablet 90 tablet 0    Sig: Take 1 tablet by mouth every 8 (eight) hours as needed for pain.    Authorizing Provider: [name]  . oxyCODONE-acetaminophen (PERCOCET) 10-325 MG tablet 90 tablet 0    Sig: Take 1 tablet by mouth every 8 (eight) hours as needed for pain.    Authorizing Provider: [name]

## 2021-04-16 NOTE — Telephone Encounter (Signed)
I called [facility], they confirmed that they do not have any current prescriptions for Oxy/Acet, even though our records indicate that it was sent. Will you please resend?

## 2021-05-01 NOTE — Progress Notes (Deleted)
[place]  [phone] [phone]  ID: Brittney [name] OB: 1951-07-02  MR#: [id number]  IDP#:[id number]  Patient Care Team: Rusty Aus, MD as PCP - General (Internal Medicine) Lloyd Huger, MD as Consulting Physician (Hematology and Oncology)  CHIEF COMPLAINT: Iron deficiency anemia.  INTERVAL HISTORY: Patient returns to clinic today for repeat laboratory work and further evaluation.  She continues to have weakness and fatigue, but admits this has improved since receiving IV iron several months ago.  She has no neurologic complaints. She denies any recent fevers or illnesses. She has a good appetite and denies weight loss.  She denies any chest pain, shortness of breath, cough, or hemoptysis.  She has no nausea, vomiting, constipation, or diarrhea. She denies any melena or hematochezia. She has no urinary complaints.  Patient offers no further specific complaints today.  REVIEW OF SYSTEMS:   Review of Systems  Constitutional: Positive for malaise/fatigue. Negative for fever and weight loss.  Respiratory: Negative.  Negative for cough, hemoptysis and shortness of breath.   Cardiovascular: Negative.  Negative for chest pain and leg swelling.  Gastrointestinal: Negative.  Negative for abdominal pain, blood in stool and melena.  Genitourinary: Negative.  Negative for dysuria.  Musculoskeletal: Negative.  Negative for back pain.  Skin: Negative.  Negative for rash.  Neurological: Positive for weakness. Negative for sensory change, focal weakness and headaches.  Psychiatric/Behavioral: Negative.  The patient is not nervous/anxious.     As per HPI. Otherwise, a complete review of systems is negative.  PAST MEDICAL HISTORY: Past Medical History:  Diagnosis Date  . Abdominal pain, left upper quadrant 12/11/2012  . Arthritis   . Asthma   . Broken leg 2014   fell twice and broke same leg. had a bone stimulator for first break.  . Cancer  ([place]) 2007   melanoma, left ear and back of left leg, removed from back  . Chronic kidney disease, stage 2 (mild)   . Chronic lower back pain   . Collagen vascular disease ([place])   . Coronary artery disease   . Diabetes mellitus without complication ([place])   . Diabetic nephropathy associated with secondary diabetes mellitus ([place])   . [place] 12/11/2012  . Gastroparesis   . Gross hematuria 12/11/2012  . Hepatic cirrhosis ([place])   . History of kidney stones   . Hyperlipidemia   . Hypertension   . Hypothyroidism   . Hypothyroidism   . IBS (irritable bowel syndrome)   . Kidney stone 12/11/2012  . Lower extremity edema   . Morbid obesity with BMI of 40.0-44.9, adult ([place])   . Myocardial infarction ([place]) 09/2013  . Nausea without vomiting 12/11/2012  . Nephrolithiasis   . Nephrolithiasis 04/26/2014  . Nonproliferative retinopathy due to secondary diabetes ([place])   . Numbness and tingling of right leg   . Peripheral vascular disease ([place])   . Renal colic 44/31/5400  . Renal insufficiency   . Sciatica   . Sciatica 12/11/2012  . Skin cancer 08/2018  . Sleep apnea    sleep study coming in november  . Stroke ([place])   . Unilateral small kidney without contralateral hypertrophy     PAST SURGICAL HISTORY: Past Surgical History:  Procedure Laterality Date  . ABDOMINAL HYSTERECTOMY    . BACK SURGERY  04/1999   L-4-5 LAMINECTOMY. metal in lower back and neck  . CESAREAN SECTION  1980  . CHOLECYSTECTOMY    . COLONOSCOPY  05/04/2001  . COLONOSCOPY WITH PROPOFOL N/A  09/20/2016   Procedure: COLONOSCOPY WITH PROPOFOL;  Surgeon: Lollie Sails, MD;  Location: [facility] ENDOSCOPY;  Service: Endoscopy;  Laterality: N/A;  . DILATION AND CURETTAGE OF UTERUS    . ESOPHAGOGASTRODUODENOSCOPY  02/08/2014  . ESOPHAGOGASTRODUODENOSCOPY (EGD) WITH PROPOFOL N/A 09/20/2016   Procedure: ESOPHAGOGASTRODUODENOSCOPY (EGD) WITH PROPOFOL;  Surgeon: Lollie Sails, MD;  Location: [facility] ENDOSCOPY;  Service:  Endoscopy;  Laterality: N/A;  . ESOPHAGOGASTRODUODENOSCOPY (EGD) WITH PROPOFOL N/A 05/23/2018   Procedure: ESOPHAGOGASTRODUODENOSCOPY (EGD) WITH PROPOFOL;  Surgeon: Lollie Sails, MD;  Location: [facility] ENDOSCOPY;  Service: Endoscopy;  Laterality: N/A;  . ESOPHAGOGASTRODUODENOSCOPY (EGD) WITH PROPOFOL N/A 07/18/2020   Procedure: ESOPHAGOGASTRODUODENOSCOPY (EGD) WITH PROPOFOL;  Surgeon: Lesly Rubenstein, MD;  Location: [facility] ENDOSCOPY;  Service: Gastroenterology;  Laterality: N/A;  . EYE SURGERY  1986   FOR MELANOMA  . HERNIA REPAIR    . JOINT REPLACEMENT Left 09/24/2013   TOTAL KNEE  . MOHS SURGERY  11/2016   left side of nose  . PULSE GENERATOR IMPLANT N/A 11/02/2017   Procedure: UNILATERAL PULSE GENERATOR IMPLANT;  Surgeon: Meade Maw, MD;  Location: [facility] ORS;  Service: Neurosurgery;  Laterality: N/A;  . TONSILLECTOMY     WITH ADENOIDECTOMY    FAMILY HISTORY: Family History  Problem Relation Age of Onset  . Heart disease Mother   . Cancer Mother   . Asthma Mother   . Diabetes Father   . Kidney disease Father   . Hypertension Father   . Breast cancer Neg Hx     ADVANCED DIRECTIVES (Y/N):  N  HEALTH MAINTENANCE: Social History   Tobacco Use  . Smoking status: Never Smoker  . Smokeless tobacco: Never Used  Vaping Use  . Vaping Use: Never used  Substance Use Topics  . Alcohol use: No  . Drug use: Never     Colonoscopy:  PAP:  Bone density:  Lipid panel:  Allergies  Allergen Reactions  . Shellfish Allergy Nausea And Vomiting, Diarrhea and Nausea Only    Non stop sickness once ingests shellfish. Betadine is okay  . Amlodipine Cough  . Codeine Nausea And Vomiting and Nausea Only    states she can take codeine cough syrup without problems states she can take codeine cough syrup without problems  . Imdur [Isosorbide Dinitrate] Other (See Comments) and Cough    Headache  . Lyrica [Pregabalin] Swelling and Other (See Comments)    "FLU-LIKE" SYMPTOMS  .  Monosodium Glutamate Other (See Comments) and Cough    Headache  . Tape Other (See Comments) and Nausea And Vomiting    can use paper tape Redness    Current Outpatient Medications  Medication Sig Dispense Refill  . albuterol (VENTOLIN HFA) 108 (90 Base) MCG/ACT inhaler Inhale 2 puffs into the lungs 3 (three) times daily as needed for wheezing or shortness of breath.     . ALPRAZolam (XANAX) 0.5 MG tablet Take 0.5 mg by mouth 4 (four) times daily. Take    . budesonide (PULMICORT) 0.5 MG/2ML nebulizer solution Take 2 mLs by nebulization daily.    . butalbital-acetaminophen-caffeine (FIORICET, ESGIC) 50-325-40 MG tablet Take 1 tablet by mouth 2 (two) times daily as needed for migraine.     . carvedilol (COREG) 25 MG tablet Take 50 mg by mouth 2 (two) times daily with a meal.     . cloNIDine (CATAPRES - DOSED IN MG/24 HR) 0.3 mg/24hr patch Place 0.3 mg onto the skin once a week.     . cyclobenzaprine (FLEXERIL) 10 MG tablet  Take 20 mg by mouth at bedtime.    Marland Kitchen doxazosin (CARDURA) 4 MG tablet Take 4 mg by mouth every evening.     . escitalopram (LEXAPRO) 10 MG tablet Take 10 mg by mouth daily.     . fluticasone (FLONASE) 50 MCG/ACT nasal spray Place 2 sprays into both nostrils as needed.     . furosemide (LASIX) 40 MG tablet Take 40 mg by mouth daily.    Marland Kitchen gabapentin (NEURONTIN) 600 MG tablet Take 1 tablet (600mg ) by mouth twice daily and take 3 tablets (1800mg ) by mouth at bedtime    . hydrALAZINE (APRESOLINE) 50 MG tablet Take 50 mg by mouth 3 (three) times daily.     Marland Kitchen ibuprofen (ADVIL,MOTRIN) 800 MG tablet Take 800 mg by mouth 3 (three) times daily as needed for mild pain or moderate pain.     . Insulin Human (INSULIN PUMP) SOLN Inject into the skin.    Marland Kitchen insulin regular human CONCENTRATED (HUMULIN R) 500 UNIT/ML kwikpen Inject 40-80 Units into the skin See admin instructions. Uses insulin pump (Patient not taking: No sig reported)    . ipratropium-albuterol (DUONEB) 0.5-2.5 (3) MG/3ML SOLN  Take 3 mLs by nebulization every 6 (six) hours.    Marland Kitchen levothyroxine (SYNTHROID) 200 MCG tablet Take 200 mcg by mouth daily before breakfast.    . LINZESS 290 MCG CAPS capsule Take 290 mcg by mouth daily.    . metoCLOPramide (REGLAN) 10 MG tablet Take 10 mg by mouth 3 (three) times daily. (Patient not taking: No sig reported)    . metolazone (ZAROXOLYN) 5 MG tablet Take 5 mg by mouth 2 (two) times a week.    . ondansetron (ZOFRAN) 4 MG tablet Take 4 mg by mouth every 12 (twelve) hours as needed for nausea or vomiting.     Marland Kitchen oxyCODONE-acetaminophen (PERCOCET) 10-325 MG tablet Take 1 tablet by mouth every 8 (eight) hours as needed for pain. 90 tablet 0  . [START ON 06/14/2021] oxyCODONE-acetaminophen (PERCOCET) 10-325 MG tablet Take 1 tablet by mouth every 8 (eight) hours as needed for pain. 90 tablet 0  . pantoprazole (PROTONIX) 40 MG tablet Take 40 mg by mouth 2 (two) times daily.     . potassium chloride (K-DUR) 10 MEQ tablet Take 10 mEq by mouth daily.    . pramipexole (MIRAPEX) 0.5 MG tablet Take 0.5 mg by mouth 2 (two) times daily.     Marland Kitchen telmisartan (MICARDIS) 40 MG tablet Take 40 mg by mouth daily.    . traMADol (ULTRAM) 50 MG tablet Take 100 mg by mouth at bedtime as needed.   3  . traZODone (DESYREL) 150 MG tablet Take 150 mg by mouth at bedtime.     Marland Kitchen zolpidem (AMBIEN) 10 MG tablet Take 0.5 tablets (5 mg total) by mouth at bedtime. 30 tablet 0   No current facility-administered medications for this visit.    OBJECTIVE: There were no vitals filed for this visit.   There is no height or weight on file to calculate BMI.    ECOG FS:2 - Symptomatic, <50% confined to bed  General: Well-developed, well-nourished, no acute distress.  Sitting in a wheelchair. Eyes: Pink conjunctiva, anicteric sclera. HEENT: Normocephalic, moist mucous membranes. Lungs: No audible wheezing or coughing. Heart: Regular rate and rhythm. Abdomen: Soft, nontender, no obvious distention. Musculoskeletal: No edema,  cyanosis, or clubbing. Neuro: Alert, answering all questions appropriately. Cranial nerves grossly intact. Skin: No rashes or petechiae noted. Psych: Normal affect.   LAB RESULTS:  Lab Results  Component Value Date   NA 136 08/21/2020   K 3.3 (L) 08/21/2020   CL 91 (L) 08/21/2020   CO2 35 (H) 08/21/2020   GLUCOSE 261 (H) 08/21/2020   BUN 14 08/21/2020   CREATININE 0.72 08/21/2020   CALCIUM 10.0 08/21/2020   PROT 7.4 08/17/2020   ALBUMIN 3.3 (L) 08/21/2020   AST 55 (H) 08/17/2020   ALT 23 08/17/2020   ALKPHOS 96 08/17/2020   BILITOT 0.7 08/17/2020   GFRNONAA >60 08/21/2020   GFRAA >60 08/21/2020    Lab Results  Component Value Date   WBC 10.6 (H) 02/02/2021   NEUTROABS 7.2 02/02/2021   HGB 11.7 (L) 02/02/2021   HCT 37.3 02/02/2021   MCV 87.8 02/02/2021   PLT 195 02/02/2021   Lab Results  Component Value Date   IRON 32 02/02/2021   TIBC 328 02/02/2021   IRONPCTSAT 10 (L) 02/02/2021   Lab Results  Component Value Date   FERRITIN 34 02/02/2021     STUDIES: US Abdomen Limited RUQ (LIVER/GB)  Result Date: 04/04/2021 CLINICAL DATA:  Cirrhosis without ascites. EXAM: ULTRASOUND ABDOMEN LIMITED RIGHT UPPER QUADRANT COMPARISON:  None. FINDINGS: Gallbladder: Surgically absent Common bile duct: Diameter: 8 mm Liver: Coarsened echotexture with no focal mass. Portal vein is patent on color Doppler imaging with normal direction of blood flow towards the liver. Other: None. IMPRESSION: 1. Previous cholecystectomy. 2. The common bile duct is mildly dilated, to 8 mm, likely from previous cholecystectomy. 3. Coarsened echotexture is nonspecific but likely due to the reported cirrhosis. Electronically Signed   By: Dorise Bullion III M.D   On: 04/04/2021 12:57    ASSESSMENT: Iron deficiency anemia.  PLAN:    1. Iron deficiency anemia:  Colonoscopy and EGD completed on September 20, 2016 were essentially normal other than some mild gastritis.  Patient's hemoglobin has  significantly improved and is now within normal limits at 12.5.  Iron stores are pending at time of dictation.  Previously, the remainder of her laboratory work was either negative or within normal limits.  She does not require additional IV Feraheme today.  Patient last received treatment on March 10, 2020.  Return to clinic in 3 months with repeat laboratory work, further evaluation, and consideration of additional treatment if needed.   2.  Back pain: Chronic and unchanged.  Patient has permanent spinal stimulator in place. 3.  Diabetes: Patient now has insulin pump. 4.  Persistently cold/hair thinning: Patient's Synthroid is being adjusted by primary care.    Patient expressed understanding and was in agreement with this plan. She also understands that She can call clinic at any time with any questions, concerns, or complaints.    Lloyd Huger, MD   05/01/2021 6:38 AM

## 2021-05-04 ENCOUNTER — Other Ambulatory Visit: Payer: Self-pay | Admitting: *Deleted

## 2021-05-04 DIAGNOSIS — D509 Iron deficiency anemia, unspecified: Secondary | ICD-10-CM

## 2021-05-04 NOTE — Progress Notes (Signed)
cbc

## 2021-05-05 ENCOUNTER — Inpatient Hospital Stay: Payer: Medicare Other

## 2021-05-05 ENCOUNTER — Inpatient Hospital Stay: Payer: Medicare Other | Admitting: Oncology

## 2021-05-05 DIAGNOSIS — D509 Iron deficiency anemia, unspecified: Secondary | ICD-10-CM

## 2021-05-08 ENCOUNTER — Other Ambulatory Visit: Payer: Self-pay

## 2021-05-09 NOTE — Progress Notes (Signed)
[place]  [phone] [phone]  ID: Brittney [name] OB: 01-28-1951  MR#: [id number]  QQI#:[id number]  Patient Care Team: Rusty Aus, MD as PCP - General (Internal Medicine) Lloyd Huger, MD as Consulting Physician (Hematology and Oncology)  CHIEF COMPLAINT: Iron deficiency anemia.  INTERVAL HISTORY: Patient returns to clinic today for repeat laboratory work and further evaluation.  She continues to have chronic weakness and fatigue.  She also has multiple medical complaints that are all chronic and unchanged in nature. She has no neurologic complaints. She denies any recent fevers or illnesses. She has a good appetite and denies weight loss.  She denies any chest pain, shortness of breath, cough, or hemoptysis.  She has no nausea, vomiting, constipation, or diarrhea. She denies any melena or hematochezia. She has no urinary complaints.  Patient offers no further specific complaints today.  REVIEW OF SYSTEMS:   Review of Systems  Constitutional: Positive for malaise/fatigue. Negative for fever and weight loss.  Respiratory: Negative.  Negative for cough, hemoptysis and shortness of breath.   Cardiovascular: Negative.  Negative for chest pain and leg swelling.  Gastrointestinal: Negative.  Negative for abdominal pain, blood in stool and melena.  Genitourinary: Negative.  Negative for dysuria.  Musculoskeletal: Negative.  Negative for back pain.  Skin: Negative.  Negative for rash.  Neurological: Positive for weakness. Negative for sensory change, focal weakness and headaches.  Psychiatric/Behavioral: Negative.  The patient is not nervous/anxious.     As per HPI. Otherwise, a complete review of systems is negative.  PAST MEDICAL HISTORY: Past Medical History:  Diagnosis Date  . Abdominal pain, left upper quadrant 12/11/2012  . Arthritis   . Asthma   . Broken leg 2014   fell twice and broke same leg. had a bone stimulator for  first break.  . Cancer ([place]) 2007   melanoma, left ear and back of left leg, removed from back  . Chronic kidney disease, stage 2 (mild)   . Chronic lower back pain   . Collagen vascular disease ([place])   . Coronary artery disease   . Diabetes mellitus without complication ([place])   . Diabetic nephropathy associated with secondary diabetes mellitus ([place])   . [place] 12/11/2012  . Gastroparesis   . Gross hematuria 12/11/2012  . Hepatic cirrhosis ([place])   . History of kidney stones   . Hyperlipidemia   . Hypertension   . Hypothyroidism   . Hypothyroidism   . IBS (irritable bowel syndrome)   . Kidney stone 12/11/2012  . Lower extremity edema   . Morbid obesity with BMI of 40.0-44.9, adult ([place])   . Myocardial infarction ([place]) 09/2013  . Nausea without vomiting 12/11/2012  . Nephrolithiasis   . Nephrolithiasis 04/26/2014  . Nonproliferative retinopathy due to secondary diabetes ([place])   . Numbness and tingling of right leg   . Peripheral vascular disease ([place])   . Renal colic 94/17/4081  . Renal insufficiency   . Sciatica   . Sciatica 12/11/2012  . Skin cancer 08/2018  . Sleep apnea    sleep study coming in november  . Stroke ([place])   . Unilateral small kidney without contralateral hypertrophy     PAST SURGICAL HISTORY: Past Surgical History:  Procedure Laterality Date  . ABDOMINAL HYSTERECTOMY    . BACK SURGERY  04/1999   L-4-5 LAMINECTOMY. metal in lower back and neck  . CESAREAN SECTION  1980  . CHOLECYSTECTOMY    . COLONOSCOPY  05/04/2001  . COLONOSCOPY  WITH PROPOFOL N/A 09/20/2016   Procedure: COLONOSCOPY WITH PROPOFOL;  Surgeon: Christena Deem, MD;  Location: [facility] ENDOSCOPY;  Service: Endoscopy;  Laterality: N/A;  . DILATION AND CURETTAGE OF UTERUS    . ESOPHAGOGASTRODUODENOSCOPY  02/08/2014  . ESOPHAGOGASTRODUODENOSCOPY (EGD) WITH PROPOFOL N/A 09/20/2016   Procedure: ESOPHAGOGASTRODUODENOSCOPY (EGD) WITH PROPOFOL;  Surgeon: Christena Deem, MD;  Location: [facility]  ENDOSCOPY;  Service: Endoscopy;  Laterality: N/A;  . ESOPHAGOGASTRODUODENOSCOPY (EGD) WITH PROPOFOL N/A 05/23/2018   Procedure: ESOPHAGOGASTRODUODENOSCOPY (EGD) WITH PROPOFOL;  Surgeon: Christena Deem, MD;  Location: [facility] ENDOSCOPY;  Service: Endoscopy;  Laterality: N/A;  . ESOPHAGOGASTRODUODENOSCOPY (EGD) WITH PROPOFOL N/A 07/18/2020   Procedure: ESOPHAGOGASTRODUODENOSCOPY (EGD) WITH PROPOFOL;  Surgeon: Regis Bill, MD;  Location: [facility] ENDOSCOPY;  Service: Gastroenterology;  Laterality: N/A;  . EYE SURGERY  1986   FOR MELANOMA  . HERNIA REPAIR    . JOINT REPLACEMENT Left 09/24/2013   TOTAL KNEE  . MOHS SURGERY  11/2016   left side of nose  . PULSE GENERATOR IMPLANT N/A 11/02/2017   Procedure: UNILATERAL PULSE GENERATOR IMPLANT;  Surgeon: Venetia Night, MD;  Location: [facility] ORS;  Service: Neurosurgery;  Laterality: N/A;  . TONSILLECTOMY     WITH ADENOIDECTOMY    FAMILY HISTORY: Family History  Problem Relation Age of Onset  . Heart disease Mother   . Cancer Mother   . Asthma Mother   . Diabetes Father   . Kidney disease Father   . Hypertension Father   . Breast cancer Neg Hx     ADVANCED DIRECTIVES (Y/N):  N  HEALTH MAINTENANCE: Social History   Tobacco Use  . Smoking status: Never Smoker  . Smokeless tobacco: Never Used  Vaping Use  . Vaping Use: Never used  Substance Use Topics  . Alcohol use: No  . Drug use: Never     Colonoscopy:  PAP:  Bone density:  Lipid panel:  Allergies  Allergen Reactions  . Shellfish Allergy Nausea And Vomiting, Diarrhea and Nausea Only    Non stop sickness once ingests shellfish. Betadine is okay  . Amlodipine Cough  . Codeine Nausea And Vomiting and Nausea Only    states she can take codeine cough syrup without problems states she can take codeine cough syrup without problems  . Imdur [Isosorbide Dinitrate] Other (See Comments) and Cough    Headache  . Lyrica [Pregabalin] Swelling and Other (See Comments)     "FLU-LIKE" SYMPTOMS  . Monosodium Glutamate Other (See Comments) and Cough    Headache  . Tape Other (See Comments) and Nausea And Vomiting    can use paper tape Redness    Current Outpatient Medications  Medication Sig Dispense Refill  . albuterol (VENTOLIN HFA) 108 (90 Base) MCG/ACT inhaler Inhale 2 puffs into the lungs 3 (three) times daily as needed for wheezing or shortness of breath.     . ALPRAZolam (XANAX) 0.5 MG tablet Take 0.5 mg by mouth 4 (four) times daily. Take    . budesonide (PULMICORT) 0.5 MG/2ML nebulizer solution Take 2 mLs by nebulization daily.    . butalbital-acetaminophen-caffeine (FIORICET, ESGIC) 50-325-40 MG tablet Take 1 tablet by mouth 2 (two) times daily as needed for migraine.     . carvedilol (COREG) 25 MG tablet Take 50 mg by mouth 2 (two) times daily with a meal.     . cloNIDine (CATAPRES - DOSED IN MG/24 HR) 0.3 mg/24hr patch Place 0.3 mg onto the skin once a week.     . cyclobenzaprine (FLEXERIL)  10 MG tablet Take 20 mg by mouth at bedtime.    Marland Kitchen doxazosin (CARDURA) 4 MG tablet Take 4 mg by mouth every evening.     . escitalopram (LEXAPRO) 10 MG tablet Take 10 mg by mouth daily.     . fluticasone (FLONASE) 50 MCG/ACT nasal spray Place 2 sprays into both nostrils as needed.     . furosemide (LASIX) 40 MG tablet Take 40 mg by mouth daily.    Marland Kitchen gabapentin (NEURONTIN) 600 MG tablet Take 1 tablet (600mg ) by mouth twice daily and take 3 tablets (1800mg ) by mouth at bedtime    . hydrALAZINE (APRESOLINE) 50 MG tablet Take 50 mg by mouth 3 (three) times daily.     Marland Kitchen ibuprofen (ADVIL,MOTRIN) 800 MG tablet Take 800 mg by mouth 3 (three) times daily as needed for mild pain or moderate pain.     . Insulin Human (INSULIN PUMP) SOLN Inject into the skin.    Marland Kitchen insulin regular human CONCENTRATED (HUMULIN R) 500 UNIT/ML kwikpen Inject 40-80 Units into the skin See admin instructions. Uses insulin pump    . ipratropium-albuterol (DUONEB) 0.5-2.5 (3) MG/3ML SOLN Take 3 mLs by  nebulization every 6 (six) hours.    Marland Kitchen levothyroxine (SYNTHROID) 200 MCG tablet Take 200 mcg by mouth daily before breakfast.    . LINZESS 290 MCG CAPS capsule Take 290 mcg by mouth daily.    . ondansetron (ZOFRAN) 4 MG tablet Take 4 mg by mouth every 12 (twelve) hours as needed for nausea or vomiting.     Marland Kitchen oxyCODONE-acetaminophen (PERCOCET) 10-325 MG tablet Take 1 tablet by mouth every 8 (eight) hours as needed for pain. 90 tablet 0  . [START ON 06/14/2021] oxyCODONE-acetaminophen (PERCOCET) 10-325 MG tablet Take 1 tablet by mouth every 8 (eight) hours as needed for pain. 90 tablet 0  . pantoprazole (PROTONIX) 40 MG tablet Take 40 mg by mouth 2 (two) times daily.     . potassium chloride (K-DUR) 10 MEQ tablet Take 10 mEq by mouth daily.    . pramipexole (MIRAPEX) 0.5 MG tablet Take 0.5 mg by mouth 2 (two) times daily.     Marland Kitchen telmisartan (MICARDIS) 40 MG tablet Take 40 mg by mouth daily.    . traMADol (ULTRAM) 50 MG tablet Take 100 mg by mouth at bedtime as needed.   3  . traZODone (DESYREL) 150 MG tablet Take 150 mg by mouth at bedtime.     Marland Kitchen zolpidem (AMBIEN) 10 MG tablet Take 0.5 tablets (5 mg total) by mouth at bedtime. 30 tablet 0  . metoCLOPramide (REGLAN) 10 MG tablet Take 10 mg by mouth 3 (three) times daily. (Patient not taking: No sig reported)    . metolazone (ZAROXOLYN) 5 MG tablet Take 5 mg by mouth 2 (two) times a week.     No current facility-administered medications for this visit.    OBJECTIVE: Vitals:   05/14/21 1437  BP: (!) 94/46  Pulse: 72  Resp: 18  Temp: 99.2 F (37.3 C)  SpO2: 98%     Body mass index is 54.76 kg/m.    ECOG FS:2 - Symptomatic, <50% confined to bed  General: Well-developed, well-nourished, no acute distress.  Sitting in wheelchair. Eyes: Pink conjunctiva, anicteric sclera. HEENT: Normocephalic, moist mucous membranes. Lungs: No audible wheezing or coughing. Heart: Regular rate and rhythm. Abdomen: Soft, nontender, no obvious  distention. Musculoskeletal: No edema, cyanosis, or clubbing. Neuro: Alert, answering all questions appropriately. Cranial nerves grossly intact. Skin: No rashes or  petechiae noted. Psych: Normal affect.   LAB RESULTS:  Lab Results  Component Value Date   NA 136 08/21/2020   K 3.3 (L) 08/21/2020   CL 91 (L) 08/21/2020   CO2 35 (H) 08/21/2020   GLUCOSE 261 (H) 08/21/2020   BUN 14 08/21/2020   CREATININE 0.72 08/21/2020   CALCIUM 10.0 08/21/2020   PROT 7.4 08/17/2020   ALBUMIN 3.3 (L) 08/21/2020   AST 55 (H) 08/17/2020   ALT 23 08/17/2020   ALKPHOS 96 08/17/2020   BILITOT 0.7 08/17/2020   GFRNONAA >60 08/21/2020   GFRAA >60 08/21/2020    Lab Results  Component Value Date   WBC 9.8 05/14/2021   NEUTROABS 6.1 05/14/2021   HGB 12.5 05/14/2021   HCT 39.0 05/14/2021   MCV 90.5 05/14/2021   PLT 184 05/14/2021   Lab Results  Component Value Date   IRON 47 05/14/2021   TIBC 270 05/14/2021   IRONPCTSAT 17 05/14/2021   Lab Results  Component Value Date   FERRITIN 181 05/14/2021     STUDIES: No results found.  ASSESSMENT: Iron deficiency anemia.  PLAN:    1. Iron deficiency anemia:  Colonoscopy and EGD completed on September 20, 2016 were essentially normal other than some mild gastritis.  Patient's hemoglobin and iron stores are now within normal limits.  She does not require additional IV Feraheme today.  Patient last received treatment on February 09, 2021.  Return to clinic in 4 months with repeat laboratory work, further evaluation, and continuation of treatment if needed.   2.  Back pain: Chronic and unchanged.  Patient has permanent spinal stimulator in place. 3.  Diabetes: Patient now has insulin pump.  I spent a total of 20 minutes reviewing chart data, face-to-face evaluation with the patient, counseling and coordination of care as detailed above.   Patient expressed understanding and was in agreement with this plan. She also understands that She can call  clinic at any time with any questions, concerns, or complaints.    Lloyd Huger, MD   05/14/2021 3:58 PM

## 2021-05-14 ENCOUNTER — Inpatient Hospital Stay: Payer: Medicare Other

## 2021-05-14 ENCOUNTER — Encounter: Payer: Self-pay | Admitting: Oncology

## 2021-05-14 ENCOUNTER — Inpatient Hospital Stay: Payer: Medicare Other | Attending: Oncology | Admitting: Oncology

## 2021-05-14 ENCOUNTER — Other Ambulatory Visit: Payer: Self-pay

## 2021-05-14 VITALS — BP 94/46 | HR 72 | Temp 99.2°F | Resp 18 | Ht 66.0 in | Wt 339.3 lb

## 2021-05-14 DIAGNOSIS — E1122 Type 2 diabetes mellitus with diabetic chronic kidney disease: Secondary | ICD-10-CM | POA: Insufficient documentation

## 2021-05-14 DIAGNOSIS — M549 Dorsalgia, unspecified: Secondary | ICD-10-CM | POA: Diagnosis not present

## 2021-05-14 DIAGNOSIS — R531 Weakness: Secondary | ICD-10-CM | POA: Insufficient documentation

## 2021-05-14 DIAGNOSIS — D509 Iron deficiency anemia, unspecified: Secondary | ICD-10-CM

## 2021-05-14 DIAGNOSIS — R5383 Other fatigue: Secondary | ICD-10-CM | POA: Insufficient documentation

## 2021-05-14 DIAGNOSIS — Z794 Long term (current) use of insulin: Secondary | ICD-10-CM | POA: Insufficient documentation

## 2021-05-14 DIAGNOSIS — G8929 Other chronic pain: Secondary | ICD-10-CM | POA: Insufficient documentation

## 2021-05-14 LAB — CBC WITH DIFFERENTIAL/PLATELET
Abs Immature Granulocytes: 0.02 10*3/uL (ref 0.00–0.07)
Basophils Absolute: 0.1 10*3/uL (ref 0.0–0.1)
Basophils Relative: 1 %
Eosinophils Absolute: 0.6 10*3/uL — ABNORMAL HIGH (ref 0.0–0.5)
Eosinophils Relative: 6 %
HCT: 39 % (ref 36.0–46.0)
Hemoglobin: 12.5 g/dL (ref 12.0–15.0)
Immature Granulocytes: 0 %
Lymphocytes Relative: 25 %
Lymphs Abs: 2.5 10*3/uL (ref 0.7–4.0)
MCH: 29 pg (ref 26.0–34.0)
MCHC: 32.1 g/dL (ref 30.0–36.0)
MCV: 90.5 fL (ref 80.0–100.0)
Monocytes Absolute: 0.6 10*3/uL (ref 0.1–1.0)
Monocytes Relative: 6 %
Neutro Abs: 6.1 10*3/uL (ref 1.7–7.7)
Neutrophils Relative %: 62 %
Platelets: 184 10*3/uL (ref 150–400)
RBC: 4.31 MIL/uL (ref 3.87–5.11)
RDW: 14.3 % (ref 11.5–15.5)
WBC: 9.8 10*3/uL (ref 4.0–10.5)
nRBC: 0 % (ref 0.0–0.2)

## 2021-05-14 LAB — IRON AND TIBC
Iron: 47 ug/dL (ref 28–170)
Saturation Ratios: 17 % (ref 10.4–31.8)
TIBC: 270 ug/dL (ref 250–450)
UIBC: 223 ug/dL

## 2021-05-14 LAB — FERRITIN: Ferritin: 181 ng/mL (ref 11–307)

## 2021-05-14 NOTE — Progress Notes (Signed)
Pt is in level 6 today with pain on her rt side hip area and down . Lt leg diabetic ulser .Marland Kitchenasthma has home health nurse come because of fluid  on the lungs   pt sts on rt arm she had skin cancer and it is not healing would like dr to look at it .

## 2021-06-08 ENCOUNTER — Encounter: Payer: Self-pay | Admitting: Ophthalmology

## 2021-06-10 ENCOUNTER — Encounter: Payer: Self-pay | Admitting: Ophthalmology

## 2021-06-15 ENCOUNTER — Encounter (HOSPITAL_COMMUNITY): Payer: Self-pay | Admitting: Urgent Care

## 2021-06-17 ENCOUNTER — Ambulatory Visit: Admit: 2021-06-17 | Payer: Medicare Other | Admitting: Ophthalmology

## 2021-06-17 HISTORY — DX: Dependence on wheelchair: Z99.3

## 2021-06-17 HISTORY — DX: Presence of dental prosthetic device (complete) (partial): Z97.2

## 2021-06-17 HISTORY — DX: Presence of other specified functional implants: Z96.89

## 2021-06-17 HISTORY — DX: Anemia, unspecified: D64.9

## 2021-06-17 HISTORY — DX: Other complications of anesthesia, initial encounter: T88.59XA

## 2021-06-17 HISTORY — DX: Motion sickness, initial encounter: T75.3XXA

## 2021-06-17 SURGERY — PHACOEMULSIFICATION, CATARACT, WITH IOL INSERTION
Anesthesia: Topical | Laterality: Right

## 2021-06-19 ENCOUNTER — Encounter: Admission: RE | Payer: Self-pay | Source: Home / Self Care

## 2021-06-19 ENCOUNTER — Ambulatory Visit: Admission: RE | Admit: 2021-06-19 | Payer: Medicare Other | Source: Home / Self Care | Admitting: Ophthalmology

## 2021-06-19 SURGERY — PHACOEMULSIFICATION, CATARACT, WITH IOL INSERTION
Anesthesia: Choice | Laterality: Right

## 2021-06-22 ENCOUNTER — Encounter: Payer: Medicare Other | Attending: Physician Assistant | Admitting: Physician Assistant

## 2021-06-22 ENCOUNTER — Other Ambulatory Visit: Payer: Self-pay

## 2021-06-22 DIAGNOSIS — E11622 Type 2 diabetes mellitus with other skin ulcer: Secondary | ICD-10-CM | POA: Insufficient documentation

## 2021-06-22 DIAGNOSIS — L97819 Non-pressure chronic ulcer of other part of right lower leg with unspecified severity: Secondary | ICD-10-CM | POA: Diagnosis not present

## 2021-06-22 DIAGNOSIS — I129 Hypertensive chronic kidney disease with stage 1 through stage 4 chronic kidney disease, or unspecified chronic kidney disease: Secondary | ICD-10-CM | POA: Diagnosis not present

## 2021-06-22 DIAGNOSIS — N182 Chronic kidney disease, stage 2 (mild): Secondary | ICD-10-CM | POA: Diagnosis not present

## 2021-06-22 DIAGNOSIS — E1140 Type 2 diabetes mellitus with diabetic neuropathy, unspecified: Secondary | ICD-10-CM | POA: Insufficient documentation

## 2021-06-22 DIAGNOSIS — E1122 Type 2 diabetes mellitus with diabetic chronic kidney disease: Secondary | ICD-10-CM | POA: Diagnosis not present

## 2021-06-22 NOTE — Progress Notes (Signed)
JIANA, LEMAIRE ([id number]) Visit Report for 06/22/2021 Allergy List Details Patient Name: Brittney [name], Brittney [name]. Date of Service: 06/22/2021 2:15 PM Medical Record Number: [id number] Patient Account Number: [id number] Date of Birth/Sex: September 17, 1951 (70 y.o. Female) Treating RN: Dolan Amen Primary Care Avram Danielson: Emily Filbert Other Clinician: Referring Esa Raden: Grayland Ormond Treating Halo Shevlin/Extender: Jeri Cos Weeks in Treatment: 0 Allergies Active Allergies codeine adhesive cephalexin Imdur Norvasc pregabalin Shellfish Containing Products isosorbide monosodium glutamate Allergy Notes Electronic Signature(s) Signed: 06/22/2021 4:30:50 PM By: Georges Mouse, Minus Breeding RN Entered By: Georges Mouse, Minus Breeding on 06/22/2021 14:44:23 [name] ([id number]) -------------------------------------------------------------------------------- Arrival Information Details Patient Name: Brittney [name], [name] Date of Service: 06/22/2021 2:15 PM Medical Record Number: [id number] Patient Account Number: [id number] Date of Birth/Sex: Feb 06, 1951 (70 y.o. Female) Treating RN: Dolan Amen Primary Care Matraca Hunkins: Emily Filbert Other Clinician: Referring Carlisle Enke: Grayland Ormond Treating Lenix Benoist/Extender: Skipper Cliche in Treatment: 0 Visit Information Patient Arrived: Wheel Chair Arrival Time: 14:37 Accompanied By: daughter Transfer Assistance: Manual Patient Identification Verified: Yes Secondary Verification Process Completed: Yes Patient Has Alerts: Yes Patient Alerts: Type II Diabetic Electronic Signature(s) Signed: 06/22/2021 3:16:18 PM By: Georges Mouse, Minus Breeding RN Entered By: Georges Mouse, Minus Breeding on 06/22/2021 15:16:18 [name] ([id number]) -------------------------------------------------------------------------------- Clinic Level of Care Assessment Details Patient Name: Brittney [name], [name] Date of Service: 06/22/2021 2:15 PM Medical Record Number:  [id number] Patient Account Number: [id number] Date of Birth/Sex: 1951/01/25 (70 y.o. Female) Treating RN: Donnamarie Poag Primary Care Cara Aguino: Emily Filbert Other Clinician: Referring Annaliyah Willig: Grayland Ormond Treating Xzayvion Vaeth/Extender: Skipper Cliche in Treatment: 0 Clinic Level of Care Assessment Items TOOL 1 Quantity Score []  - Use when EandM and Procedure is performed on INITIAL visit 0 ASSESSMENTS - Nursing Assessment / Reassessment []  - General Physical Exam (combine w/ comprehensive assessment (listed just below) when performed on new 0 pt. evals) []  - 0 Comprehensive Assessment (HX, ROS, Risk Assessments, Wounds Hx, etc.) ASSESSMENTS - Wound and Skin Assessment / Reassessment []  - Dermatologic / Skin Assessment (not related to wound area) 0 ASSESSMENTS - Ostomy and/or Continence Assessment and Care []  - Incontinence Assessment and Management 0 []  - 0 Ostomy Care Assessment and Management (repouching, etc.) PROCESS - Coordination of Care []  - Simple Patient / Family Education for ongoing care 0 []  - 0 Complex (extensive) Patient / Family Education for ongoing care []  - 0 Staff obtains Programmer, systems, Records, Test Results / Process Orders []  - 0 Staff telephones HHA, Nursing Homes / Clarify orders / etc []  - 0 Routine Transfer to another Facility (non-emergent condition) []  - 0 Routine Hospital Admission (non-emergent condition) []  - 0 New Admissions / Biomedical engineer / Ordering NPWT, Apligraf, etc. []  - 0 Emergency Hospital Admission (emergent condition) PROCESS - Special Needs []  - Pediatric / Minor Patient Management 0 []  - 0 Isolation Patient Management []  - 0 Hearing / Language / Visual special needs []  - 0 Assessment of Community assistance (transportation, D/C planning, etc.) []  - 0 Additional assistance / Altered mentation []  - 0 Support Surface(s) Assessment (bed, cushion, seat, etc.) INTERVENTIONS - Miscellaneous []  - External ear exam 0 []  -  0 Patient Transfer (multiple staff / Civil Service fast streamer / Similar devices) []  - 0 Simple Staple / Suture removal (25 or less) []  - 0 Complex Staple / Suture removal (26 or more) []  - 0 Hypo/Hyperglycemic Management (do not check if billed separately) []  - 0 Ankle / Brachial Index (ABI) - do not check if billed separately Has the patient been seen at the hospital within  the last three years: Yes Total Score: 0 Level Of Care: ____ [name] ([id number]) Electronic Signature(s) Signed: 06/22/2021 4:19:43 PM By: Donnamarie Poag Entered By: Donnamarie Poag on 06/22/2021 15:43:54 [name] ([id number]) -------------------------------------------------------------------------------- Lower Extremity Assessment Details Patient Name: Brittney [name], [name] Date of Service: 06/22/2021 2:15 PM Medical Record Number: [id number] Patient Account Number: [id number] Date of Birth/Sex: June 28, 1951 (70 y.o. Female) Treating RN: Dolan Amen Primary Care Jahyra Sukup: Emily Filbert Other Clinician: Referring Kacyn Souder: Grayland Ormond Treating Holbert Caples/Extender: Jeri Cos Weeks in Treatment: 0 Edema Assessment Assessed: [Left: No] [Right: Yes] Edema: [Left: Ye] [Right: s] Calf Left: Right: Point of Measurement: 31 cm From Medial Instep 47.5 cm Ankle Left: Right: Point of Measurement: 12 cm From Medial Instep 31.2 cm Vascular Assessment Pulses: Dorsalis Pedis Palpable: [Right:No] Doppler Audible: [Right:Yes] Posterior Tibial Palpable: [Right:No Yes] Notes unable to do ABI d/t wound location/painful for patient Electronic Signature(s) Signed: 06/22/2021 4:30:50 PM By: Georges Mouse, Minus Breeding RN Entered By: Georges Mouse, Minus Breeding on 06/22/2021 15:05:11 [name] ([id number]) -------------------------------------------------------------------------------- Multi Wound Chart Details Patient Name: Brittney Gentles B. Date of Service: 06/22/2021 2:15 PM Medical Record Number: [id number] Patient  Account Number: [id number] Date of Birth/Sex: 20-Dec-1951 (70 y.o. Female) Treating RN: Donnamarie Poag Primary Care Thurma Priego: Emily Filbert Other Clinician: Referring Clarice Bonaventure: Grayland Ormond Treating Kevonta Phariss/Extender: Skipper Cliche in Treatment: 0 Vital Signs Height(in): 66 Pulse(bpm): 61 Weight(lbs): 329 Blood Pressure(mmHg): 149/72 Body Mass Index(BMI): 53 Temperature(F): 98.6 Respiratory Rate(breaths/min): 20 Photos: [N/A:N/A] Wound Location: Right Lower Leg N/A N/A Wounding Event: Hematoma N/A N/A Primary Etiology: Trauma, Other N/A N/A Comorbid History: Anemia, Lymphedema, Chronic N/A N/A Obstructive Pulmonary Disease (COPD), Sleep Apnea, Hypertension, Myocardial Infarction, Cirrhosis , Osteoarthritis, Neuropathy Date Acquired: 04/26/2021 N/A N/A Weeks of Treatment: 0 N/A N/A Wound Status: Open N/A N/A Clustered Wound: Yes N/A N/A Clustered Quantity: 2 N/A N/A Measurements L x W x D (cm) 8x5x0.7 N/A N/A Area (cm) : 31.416 N/A N/A Volume (cm) : 21.991 N/A N/A Classification: Unclassifiable N/A N/A Exudate Amount: Large N/A N/A Exudate Type: Sanguinous N/A N/A Exudate Color: red N/A N/A Granulation Amount: None Present (0%) N/A N/A Necrotic Amount: None Present (0%) N/A N/A Exposed Structures: Fat Layer (Subcutaneous Tissue): N/A N/A Yes Fascia: No Tendon: No Muscle: No Joint: No Bone: No Epithelialization: Large (67-100%) N/A N/A Treatment Notes Electronic Signature(s) Signed: 06/22/2021 4:19:43 PM By: Donnamarie Poag Entered By: Donnamarie Poag on 06/22/2021 15:17:18 [name] ([id number]) -------------------------------------------------------------------------------- Lares Details Patient Name: Brittney [name], [name] Date of Service: 06/22/2021 2:15 PM Medical Record Number: [id number] Patient Account Number: [id number] Date of Birth/Sex: April 13, 1951 (70 y.o. Female) Treating RN: Donnamarie Poag Primary Care Louan Base: Emily Filbert Other  Clinician: Referring Grisel Blumenstock: Grayland Ormond Treating Michie Molnar/Extender: Skipper Cliche in Treatment: 0 Active Inactive Wound/Skin Impairment Nursing Diagnoses: Impaired tissue integrity Knowledge deficit related to smoking impact on wound healing Knowledge deficit related to ulceration/compromised skin integrity Goals: Ulcer/skin breakdown will have a volume reduction of 30% by week 4 Date Initiated: 06/22/2021 Target Resolution Date: 07/20/2021 Goal Status: Active Ulcer/skin breakdown will have a volume reduction of 50% by week 8 Date Initiated: 06/22/2021 Target Resolution Date: 08/17/2021 Goal Status: Active Ulcer/skin breakdown will have a volume reduction of 80% by week 12 Date Initiated: 06/22/2021 Target Resolution Date: 09/14/2021 Goal Status: Active Interventions: Assess patient/caregiver ability to obtain necessary supplies Assess patient/caregiver ability to perform ulcer/skin care regimen upon admission and as needed Assess ulceration(s) every visit Notes: Electronic Signature(s) Signed: 06/22/2021 4:19:43 PM By: Donnamarie Poag  Entered By: Donnamarie Poag on 06/22/2021 15:16:48 [name] ([id number]) -------------------------------------------------------------------------------- Pain Assessment Details Patient Name: Brittney [name], [name] Date of Service: 06/22/2021 2:15 PM Medical Record Number: [id number] Patient Account Number: [id number] Date of Birth/Sex: 03/25/1951 (70 y.o. Female) Treating RN: Dolan Amen Primary Care Durrel Mcnee: Emily Filbert Other Clinician: Referring Dontez Hauss: Grayland Ormond Treating Jonet Mathies/Extender: Skipper Cliche in Treatment: 0 Active Problems Location of Pain Severity and Description of Pain Patient Has Paino Yes Site Locations Pain Location: Pain in Ulcers Rate the pain. Current Pain Level: 8 Pain Management and Medication Current Pain Management: Electronic Signature(s) Signed: 06/22/2021 4:30:50 PM By: Georges Mouse,  Minus Breeding RN Entered By: Georges Mouse, Kenia on 06/22/2021 14:38:17 [name] ([id number]) -------------------------------------------------------------------------------- Patient/Caregiver Education Details Patient Name: Brittney Gentles B. Date of Service: 06/22/2021 2:15 PM Medical Record Number: [id number] Patient Account Number: [id number] Date of Birth/Gender: 1951/06/08 (70 y.o. Female) Treating RN: Donnamarie Poag Primary Care Physician: Emily Filbert Other Clinician: Referring Physician: Grayland Ormond Treating Physician/Extender: Skipper Cliche in Treatment: 0 Education Assessment Education Provided To: Patient and Caregiver daughter Education Topics Provided Basic Hygiene: Methods: Explain/Verbal Responses: State content correctly Infection: Methods: Explain/Verbal Responses: State content correctly Welcome To [facility]: Methods: Explain/Verbal Responses: State content correctly Wound/Skin Impairment: Methods: Explain/Verbal Responses: State content correctly Electronic Signature(s) Signed: 06/22/2021 4:19:43 PM By: Donnamarie Poag Entered By: Donnamarie Poag on 06/22/2021 15:18:35 [name] ([id number]) -------------------------------------------------------------------------------- Wound Assessment Details Patient Name: Brittney Gentles B. Date of Service: 06/22/2021 2:15 PM Medical Record Number: [id number] Patient Account Number: [id number] Date of Birth/Sex: 05/31/1951 (70 y.o. Female) Treating RN: Dolan Amen Primary Care Dennys Guin: Emily Filbert Other Clinician: Referring Miyoko Hashimi: Grayland Ormond Treating Lyza Houseworth/Extender: Jeri Cos Weeks in Treatment: 0 Wound Status Wound Number: 1 Primary Trauma, Other Etiology: Wound Location: Right Lower Leg Wound Open Wounding Event: Hematoma Status: Date Acquired: 04/26/2021 Comorbid Anemia, Lymphedema, Chronic Obstructive Pulmonary Weeks Of Treatment: 0 History: Disease (COPD), Sleep Apnea,  Hypertension, Myocardial Clustered Wound: Yes Infarction, Cirrhosis , Osteoarthritis, Neuropathy Photos Wound Measurements Length: (cm) 8 Width: (cm) 5 Depth: (cm) 0.7 Clustered Quantity: 2 Area: (cm) 31.416 Volume: (cm) 21.991 % Reduction in Area: % Reduction in Volume: Epithelialization: Large (67-100%) Tunneling: No Undermining: No Wound Description Classification: Unclassifiable Exudate Amount: Large Exudate Type: Sanguinous Exudate Color: red Foul Odor After Cleansing: No Slough/Fibrino No Wound Bed Granulation Amount: None Present (0%) Exposed Structure Necrotic Amount: None Present (0%) Fascia Exposed: No Fat Layer (Subcutaneous Tissue) Exposed: Yes Tendon Exposed: No Muscle Exposed: No Joint Exposed: No Bone Exposed: No Electronic Signature(s) Signed: 06/22/2021 4:30:50 PM By: Georges Mouse, Minus Breeding RN Entered By: Georges Mouse, Minus Breeding on 06/22/2021 15:00:23 [name] ([id number]) -------------------------------------------------------------------------------- Vitals Details Patient Name: Brittney Gentles B. Date of Service: 06/22/2021 2:15 PM Medical Record Number: [id number] Patient Account Number: [id number] Date of Birth/Sex: [birth year] (70 y.o. Female) Treating RN: Dolan Amen Primary Care Jerzie Bieri: Emily Filbert Other Clinician: Referring Wyona Neils: Grayland Ormond Treating Rebie Peale/Extender: Skipper Cliche in Treatment: 0 Vital Signs Time Taken: 14:38 Temperature (F): 98.6 Height (in): 66 Pulse (bpm): 61 Source: Stated Respiratory Rate (breaths/min): 20 Weight (lbs): 329 Blood Pressure (mmHg): 149/72 Source: Stated Reference Range: 80 - 120 mg / dl Body Mass Index (BMI): 53.1 Electronic Signature(s) Signed: 06/22/2021 4:30:50 PM By: Georges Mouse, Minus Breeding RN Entered By: Georges Mouse, Minus Breeding on 06/22/2021 14:41:37

## 2021-06-22 NOTE — Progress Notes (Signed)
MUSHKA, LACONTE ([id number]) Visit Report for 06/22/2021 Abuse/Suicide Risk Screen Details Patient Name: Brittney [name], Brittney [name]. Date of Service: 06/22/2021 2:15 PM Medical Record Number: [id number] Patient Account Number: [id number] Date of Birth/Sex: 19-Mar-1951 (70 y.o. Female) Treating RN: Dolan Amen Primary Care Khayree Delellis: Emily Filbert Other Clinician: Referring Schawn Byas: Grayland Ormond Treating Jacqui Headen/Extender: Skipper Cliche in Treatment: 0 Abuse/Suicide Risk Screen Items Answer ABUSE RISK SCREEN: Has anyone close to you tried to hurt or harm you recentlyo No Do you feel uncomfortable with anyone in your familyo No Has anyone forced you do things that you didnot want to doo No Electronic Signature(s) Signed: 06/22/2021 4:30:50 PM By: Georges Mouse, Minus Breeding RN Entered By: Georges Mouse, Minus Breeding on 06/22/2021 14:50:55 [name] ([id number]) -------------------------------------------------------------------------------- Activities of Daily Living Details Patient Name: Brittney [name], [name] Date of Service: 06/22/2021 2:15 PM Medical Record Number: [id number] Patient Account Number: [id number] Date of Birth/Sex: 1951-09-17 (70 y.o. Female) Treating RN: Dolan Amen Primary Care Karl Knarr: Emily Filbert Other Clinician: Referring Ciclaly Mulcahey: Grayland Ormond Treating Deontaye Civello/Extender: Skipper Cliche in Treatment: 0 Activities of Daily Living Items Answer Activities of Daily Living (Please select one for each item) Drive Automobile Not Able Take Medications Completely Able Use Telephone Completely Able Care for Appearance Completely Able Use Toilet Completely Able Bath / Shower Need Assistance Dress Self Need Assistance Feed Self Completely Able Walk Not Able Get In / Out Bed Need Assistance Housework Need Assistance Prepare Meals Need Assistance Handle Money Need Assistance Shop for Self Need Assistance Electronic Signature(s) Signed: 06/22/2021 4:30:50 PM By:  Georges Mouse, Minus Breeding RN Entered By: Georges Mouse, Minus Breeding on 06/22/2021 14:51:34 [name] ([id number]) -------------------------------------------------------------------------------- Education Screening Details Patient Name: Brittney Gentles B. Date of Service: 06/22/2021 2:15 PM Medical Record Number: [id number] Patient Account Number: [id number] Date of Birth/Sex: Oct 25, 1951 (70 y.o. Female) Treating RN: Dolan Amen Primary Care Delvis Kau: Emily Filbert Other Clinician: Referring Milica Gully: Grayland Ormond Treating Miller Limehouse/Extender: Skipper Cliche in Treatment: 0 Primary Learner Assessed: Patient Learning Preferences/Education Level/Primary Language Learning Preference: Explanation, Demonstration Highest Education Level: High School Preferred Language: English Cognitive Barrier Language Barrier: No Translator Needed: No Memory Deficit: No Emotional Barrier: No Cultural/Religious Beliefs Affecting Medical Care: No Physical Barrier Impaired Vision: No Impaired Hearing: No Decreased Hand dexterity: No Knowledge/Comprehension Knowledge Level: Medium Comprehension Level: Medium Ability to understand written instructions: Medium Ability to understand verbal instructions: Medium Motivation Anxiety Level: Calm Cooperation: Cooperative Education Importance: Acknowledges Need Interest in Health Problems: Asks Questions Perception: Coherent Willingness to Engage in Self-Management Medium Activities: Readiness to Engage in Self-Management Medium Activities: Electronic Signature(s) Signed: 06/22/2021 4:30:50 PM By: Georges Mouse, Minus Breeding RN Entered By: Georges Mouse, Minus Breeding on 06/22/2021 14:52:00 [name] ([id number]) -------------------------------------------------------------------------------- Fall Risk Assessment Details Patient Name: Brittney Gentles B. Date of Service: 06/22/2021 2:15 PM Medical Record Number: [id number] Patient Account Number:  [id number] Date of Birth/Sex: Mar 02, 1951 (70 y.o. Female) Treating RN: Dolan Amen Primary Care Andranik Jeune: Emily Filbert Other Clinician: Referring Blaze Sandin: Grayland Ormond Treating Derrian Rodak/Extender: Skipper Cliche in Treatment: 0 Fall Risk Assessment Items Have you had 2 or more falls in the last 12 monthso 0 No Have you had any fall that resulted in injury in the last 12 monthso 0 No FALLS RISK SCREEN History of falling - immediate or within 3 months 0 No Secondary diagnosis (Do you have 2 or more medical diagnoseso) 15 Yes Ambulatory aid None/bed rest/wheelchair/nurse 0 Yes Crutches/cane/walker 0 No Furniture 0 No Intravenous therapy Access/Saline/Heparin Lock 0 No Gait/Transferring Normal/ bed rest/ wheelchair  0 Yes Weak (short steps with or without shuffle, stooped but able to lift head while walking, may 0 No seek support from furniture) Impaired (short steps with shuffle, may have difficulty arising from chair, head down, impaired 0 No balance) Mental Status Oriented to own ability 0 Yes Electronic Signature(s) Signed: 06/22/2021 4:30:50 PM By: Georges Mouse, Minus Breeding RN Entered By: Georges Mouse, Minus Breeding on 06/22/2021 14:52:30 [name] ([id number]) -------------------------------------------------------------------------------- Foot Assessment Details Patient Name: Brittney Gentles B. Date of Service: 06/22/2021 2:15 PM Medical Record Number: [id number] Patient Account Number: [id number] Date of Birth/Sex: January 10, 1951 (70 y.o. Female) Treating RN: Dolan Amen Primary Care Antanasia Kaczynski: Emily Filbert Other Clinician: Referring Cherri Yera: Grayland Ormond Treating Hersel Mcmeen/Extender: Skipper Cliche in Treatment: 0 Foot Assessment Items Site Locations + = Sensation present, - = Sensation absent, C = Callus, U = Ulcer R = Redness, W = Warmth, M = Maceration, PU = Pre-ulcerative lesion F = Fissure, S = Swelling, D = Dryness Assessment Right: Left: Other  Deformity: No No Prior Foot Ulcer: No No Prior Amputation: No No Charcot Joint: No No Ambulatory Status: Non-ambulatory Assistance Device: Wheelchair Gait: Electronic Signature(s) Signed: 06/22/2021 4:30:50 PM By: Georges Mouse, Minus Breeding RN Entered By: Georges Mouse, Minus Breeding on 06/22/2021 14:55:04 [name] ([id number]) -------------------------------------------------------------------------------- Nutrition Risk Screening Details Patient Name: Brittney Gentles B. Date of Service: 06/22/2021 2:15 PM Medical Record Number: [id number] Patient Account Number: [id number] Date of Birth/Sex: 15-May-1951 (70 y.o. Female) Treating RN: Dolan Amen Primary Care Charisma Charlot: Emily Filbert Other Clinician: Referring Amazing Cowman: Grayland Ormond Treating Nataly Pacifico/Extender: Jeri Cos Weeks in Treatment: 0 Height (in): 66 Weight (lbs): 329 Body Mass Index (BMI): 53.1 Nutrition Risk Screening Items Score Screening NUTRITION RISK SCREEN: I have an illness or condition that made me change the kind and/or amount of food I eat 0 No I eat fewer than two meals per day 0 No I eat few fruits and vegetables, or milk products 0 No I have three or more drinks of beer, liquor or wine almost every day 0 No I have tooth or mouth problems that make it hard for me to eat 0 No I don't always have enough money to buy the food I need 0 No I eat alone most of the time 0 No I take three or more different prescribed or over-the-counter drugs a day 1 Yes Without wanting to, I have lost or gained 10 pounds in the last six months 0 No I am not always physically able to shop, cook and/or feed myself 0 No Nutrition Protocols Good Risk Protocol 0 No interventions needed Moderate Risk Protocol High Risk Proctocol Risk Level: Good Risk Score: 1 Electronic Signature(s) Signed: 06/22/2021 4:30:50 PM By: Georges Mouse, Minus Breeding RN Entered By: Georges Mouse, Minus Breeding on 06/22/2021 14:53:39

## 2021-06-22 NOTE — Progress Notes (Signed)
Brittney [name], Brittney [name] ([id number]) Visit Report for 06/22/2021 Chief Complaint Document Details Patient Name: Brittney [name], Brittney [name]. Date of Service: 06/22/2021 2:15 PM Medical Record Number: [id number] Patient Account Number: [id number] Date of Birth/Sex: 18-Jun-1951 (70 y.o. Female) Treating RN: Donnamarie Poag Primary Care Provider: Emily Filbert Other Clinician: Referring Provider: Grayland Ormond Treating Provider/Extender: Skipper Cliche in Treatment: 0 Information Obtained from: Patient Chief Complaint Right LE Hematoma following contusion Electronic Signature(s) Signed: 06/22/2021 3:13:14 PM By: Worthy Keeler PA-C Entered By: Worthy Keeler on 06/22/2021 15:13:13 Brittney [name] ([id number]) -------------------------------------------------------------------------------- Debridement Details Patient Name: Brittney Gentles B. Date of Service: 06/22/2021 2:15 PM Medical Record Number: [id number] Patient Account Number: [id number] Date of Birth/Sex: [birth year] (70 y.o. Female) Treating RN: Donnamarie Poag Primary Care Provider: Emily Filbert Other Clinician: Referring Provider: Grayland Ormond Treating Provider/Extender: Skipper Cliche in Treatment: 0 Debridement Performed for Wound #1 Right Lower Leg Assessment: Performed By: Physician Tommie Sams., PA-C Debridement Type: Debridement Level of Consciousness (Pre- Awake and Alert procedure): Pre-procedure Verification/Time Out Yes - 13:23 Taken: Start Time: 13:23 Pain Control: Lidocaine 2% Topical Gel Total Area Debrided (L x W): 8 (cm) x 5 (cm) = 40 (cm) Tissue and other material Viable, Non-Viable, Blood Clots, Eschar, Slough, Subcutaneous, Skin: Dermis , Skin: Epidermis, Slough debrided: Level: Skin/Subcutaneous Tissue Debridement Description: Excisional Instrument: Forceps, Scissors Bleeding: Moderate Hemostasis Achieved: Silver Nitrate End Time: 13:39 Response to Treatment: Procedure was tolerated well Level of  Consciousness (Post- Awake and Alert procedure): Post Debridement Measurements of Total Wound Length: (cm) 8 Width: (cm) 5 Depth: (cm) 2.1 Volume: (cm) 65.973 Character of Wound/Ulcer Post Debridement: Improved Post Procedure Diagnosis Same as Pre-procedure Electronic Signature(s) Signed: 06/22/2021 4:19:43 PM By: Donnamarie Poag Signed: 06/22/2021 5:58:21 PM By: Worthy Keeler PA-C Entered By: Donnamarie Poag on 06/22/2021 15:40:43 Brittney [name] ([id number]) -------------------------------------------------------------------------------- HPI Details Patient Name: Brittney Gentles B. Date of Service: 06/22/2021 2:15 PM Medical Record Number: [id number] Patient Account Number: [id number] Date of Birth/Sex: 1951/07/18 (70 y.o. Female) Treating RN: Donnamarie Poag Primary Care Provider: Emily Filbert Other Clinician: Referring Provider: Grayland Ormond Treating Provider/Extender: Skipper Cliche in Treatment: 0 History of Present Illness HPI Description: 06/22/2021 upon evaluation today patient appears to be doing somewhat poorly in regard to the wound that actually started around the beginning of May. She got a an IT trainer wheelchair which she tells me was for the most part a blessing. With that being said she unfortunately had an issue where she was in the bathroom and subsequently had left it on when she stopped to do something at the counter. In the end she actually had some issues here with running into the cabinet. She tells me that she actually hit the cabinet a couple times and could not get the chair to back up and to stop therefore she had quite a bit of damage to her leg. Her primary care provider put her in a boot on this trying to get it to reabsorb but unfortunately that is just not the case and not what we see happen. Fortunately there does not appear to be any signs of active infection at this time she is on doxycycline for a sinus infection completely separate from this issue but  nonetheless that does seem to be a good antibiotic generally for skin infections and I think is at least something good for her currently. She does have some discomfort. Her daughter is present during the evaluation today. I did explain to the patient and her daughter  that I do believe her get have to evacuate the hematoma this is already open and to be honest its not getting healed very well until we clear this out. 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 With that being said her hemoglobin A1c is stated to be around 7.5 which is actually good and should not impede her healing this is great news. Overall I am pleased with what I am hearing in that regard. With that being said I still think this is going to take quite a while for this patient to heal we do need however to clear away the necrotic and nonviable/healthy tissue. Electronic Signature(s) Signed: 06/22/2021 5:33:23 PM By: Worthy Keeler PA-C Entered By: Worthy Keeler on 06/22/2021 17:33:22 Brittney [name] ([id number]) -------------------------------------------------------------------------------- Physical Exam Details Patient Name: Brittney [name], Brittney [name] B. Date of Service: 06/22/2021 2:15 PM Medical Record Number: [id number] Patient Account Number: [id number] Date of Birth/Sex: Feb 19, 1951 (70 y.o. Female) Treating RN: Donnamarie Poag Primary Care Provider: Emily Filbert Other Clinician: Referring Provider: Grayland Ormond Treating Provider/Extender: Skipper Cliche in Treatment: 0 Constitutional patient is hypertensive.. pulse regular and within target range for patient.Marland Kitchen respirations regular, non-labored and within target range for patient.Marland Kitchen temperature within target range for patient.. Well-nourished and well-hydrated in no acute distress. Eyes conjunctiva clear no eyelid edema noted. pupils equal round  and reactive to light and accommodation. Ears, Nose, Mouth, and Throat no gross abnormality of ear auricles or external auditory canals. normal hearing noted during conversation. mucus membranes moist. Respiratory normal breathing without difficulty. Cardiovascular 2+ dorsalis pedis/posterior tibialis pulses. 1+ pitting edema of the bilateral lower extremities. Musculoskeletal normal gait and posture. no significant deformity or arthritic changes, no loss or range of motion, no clubbing. Psychiatric this patient is able to make decisions and demonstrates good insight into disease process. Alert and Oriented x 3. pleasant and cooperative. Notes Upon evaluation today patient is pretty much eschar covered across the board with regard to her wound. I do believe this is going to require sharp debridement to clear away the eschar in order to try and get this area to heal more effectively and quickly. All that was discussed with the patient and her daughter today they are in agreement with the plan. Following I did actually perform sharp debridement to clear this away. She did have a small arterial noted on the roughly 1:00 location that bled most significantly during the procedure everything else was fairly minute. I did use silver nitrate and a total of 4 sticks of her to achieve hemostasis the good news is complete hemostasis was achieved. She overall I think did quite well with the procedure and seems to have good blood flow to the extremity I feel like she is going to heal quite nicely is just to take some time I discussed all this with her today. Electronic Signature(s) Signed: 06/22/2021 5:35:39 PM By: Worthy Keeler PA-C Entered By: Worthy Keeler on 06/22/2021 17:35:39 Brittney [name] ([id number]) -------------------------------------------------------------------------------- Physician Orders Details Patient Name: Brittney [name], Brittney [name] B. Date of Service: 06/22/2021 2:15 PM Medical Record  Number: [id number] Patient Account Number: [id number] Date of Birth/Sex: 11/15/1951 (70 y.o. Female) Treating RN: Donnamarie Poag Primary Care Provider: Emily Filbert Other Clinician: Referring Provider: Grayland Ormond Treating Provider/Extender: Skipper Cliche in Treatment: 0 Verbal / Phone Orders: No Diagnosis Coding ICD-10 Coding Code Description S80.11XA Contusion of right lower leg, initial encounter  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 with unspecified severity I10 Essential (primary) hypertension N18.2 Chronic kidney disease, stage 2 (mild) J44.9 Chronic obstructive pulmonary disease, unspecified E11.622 Type 2 diabetes mellitus with other skin ulcer Follow-up Appointments o Return Appointment in 1 week. o Nurse Visit as needed [place] for wound care. May utilize formulary equivalent dressing for wound treatment orders unless otherwise specified. Home Health Nurse may visit [facility] to address patientos wound care needs. - [facility]-follow wound care orders Bathing/ Shower/ Hygiene o May shower with wound dressing protected with water repellent cover or cast protector. o No tub bath. Additional Orders / Instructions o Follow Nutritious Diet and Increase Protein Intake Wound Treatment Wound #1 - Lower Leg Wound Laterality: Right Cleanser: Normal Saline (Home Health) 1 x Per Day/30 Days Discharge Instructions: Wash your hands with soap and water. Remove old dressing, discard into plastic bag and place into trash. Cleanse the wound with Normal Saline prior to applying a clean dressing using gauze sponges, not tissues or cotton balls. Do not scrub or use excessive force. Pat dry using gauze sponges, not tissue or cotton balls. Primary Dressing: Gauze (Home Health) 1 x Per Day/30 Days Discharge Instructions: As directed: dry, moistened with Dakins Solution Secondary Dressing: Xtrasorb Medium 4x5 (in/in) (Home  Health) 1 x Per Day/30 Days Discharge Instructions: Apply to wound as directed. Do not cut. Secured With: 57M Medipore H Soft Cloth Surgical Tape, 2x2 (in/yd) (Home Health) 1 x Per Day/30 Days Secured With: Kerlix Roll Sterile or Non-Sterile 6-ply 4.5x4 (yd/yd) (Home Health) 1 x Per Day/30 Days Discharge Instructions: LIGHTLY TO SECURE DRESSING/Apply Kerlix as directed Secured With: Tubigrip Size E, 3.5x10 (in/yds) (Home Health) 1 x Per Day/30 Days Discharge Instructions: Apply 3 Tubigrip E 3-finger-widths below knee to base of toes to secure dressing and/or for swelling. Patient Medications Allergies: codeine, adhesive, cephalexin, Imdur, Norvasc, pregabalin, Shellfish Containing Products, isosorbide, monosodium glutamate Notifications Medication Indication Start End Dakin's Solution 06/22/2021 VERNELLE, WISNER ([id number]) Notifications Medication Indication Start End DOSE miscellaneous 0.25 % solution - Moisten gauze with Dakin's solution then wring out leaving gauze damp not saturated before packing into the wound bed lightly daily Electronic Signature(s) Signed: 06/22/2021 3:58:59 PM By: Worthy Keeler PA-C Entered By: Worthy Keeler on 06/22/2021 15:58:58 Brittney [name] ([id number]) -------------------------------------------------------------------------------- Problem List Details Patient Name: Brittney [name], Brittney [name] B. Date of Service: 06/22/2021 2:15 PM Medical Record Number: [id number] Patient Account Number: [id number] Date of Birth/Sex: 12-Dec-1951 (70 y.o. 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 with 06/22/2021 No  Yes unspecified severity I10 Essential (primary) hypertension 06/22/2021 No Yes N18.2 Chronic kidney disease, stage 2 (mild) 06/22/2021 No Yes J44.9 Chronic obstructive pulmonary disease, unspecified 06/22/2021 No Yes E11.622 Type 2 diabetes mellitus with other skin ulcer 06/22/2021 No Yes Inactive Problems Resolved Problems Electronic Signature(s) Signed: 06/22/2021 3:15:19 PM By: Worthy Keeler PA-C Previous Signature: 06/22/2021 3:12:56 PM Version By: Worthy Keeler PA-C Entered By: Worthy Keeler on 06/22/2021 15:15:19 Brittney [name] ([id number]) -------------------------------------------------------------------------------- Progress Note Details Patient Name: Brittney Gentles B. Date of Service: 06/22/2021 2:15 PM Medical Record Number: [id number] Patient Account Number: [id number] Date of Birth/Sex: 09/04/1951 (70  y.o. Female) Treating RN: Donnamarie Poag Primary Care Provider: Emily Filbert Other Clinician: Referring Provider: Grayland Ormond Treating Provider/Extender: Skipper Cliche in Treatment: 0 Subjective Chief Complaint Information obtained from Patient Right LE Hematoma following contusion History of Present Illness (HPI) 06/22/2021 upon evaluation today patient appears to be doing somewhat poorly in regard to the wound that actually started around the beginning of May. She got a an IT trainer wheelchair which she tells me was for the most part a blessing. With that being said she unfortunately had an issue where she was in the bathroom and subsequently had left it on when she stopped to do something at the counter. In the end she actually had some issues here with running into the cabinet. She tells me that she actually hit the cabinet a couple times and could not get the chair to back up and to stop therefore she had quite a bit of damage to her leg. Her primary care provider put her in a boot on this trying to get it to reabsorb but unfortunately that is just not the case  and not what we see happen. Fortunately there does not appear to be any signs of active infection at this time she is on doxycycline for a sinus infection completely separate from this issue but nonetheless that does seem to be a good antibiotic generally for skin infections and I think is at least something good for her currently. She does have some discomfort. Her daughter is present during the evaluation today. I did explain to the patient and her daughter that I do believe her get have to evacuate the hematoma this is already open and to be honest its not getting healed very well until we clear this out. 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 With that being said her hemoglobin A1c is stated to be around 7.5 which is actually good and should not impede her healing this is great news. Overall I am pleased with what I am hearing in that regard. With that being said I still think this is going to take quite a while for this patient to heal we do need however to clear away the necrotic and nonviable/healthy tissue. Patient History Information obtained from Patient. Allergies codeine, adhesive, cephalexin, Imdur, Norvasc, pregabalin, Shellfish Containing Products, isosorbide, monosodium glutamate Social History Never smoker, Alcohol Use - Never, Drug Use - No History, Caffeine Use - Never. Medical History Eyes Denies history of Cataracts, Glaucoma, Optic Neuritis Ear/Nose/Mouth/Throat Denies history of Chronic sinus problems/congestion, Middle ear problems Hematologic/Lymphatic Patient has history of Anemia, Lymphedema Denies history of Hemophilia, Human Immunodeficiency Virus, Sickle Cell Disease Respiratory Patient has history of Chronic Obstructive Pulmonary Disease (COPD), Sleep Apnea Denies history of Aspiration, Asthma, Pneumothorax,  Tuberculosis Cardiovascular Patient has history of Hypertension, Myocardial Infarction - 2015 Denies history of Angina, Arrhythmia, Congestive Heart Failure, Coronary Artery Disease, Deep Vein Thrombosis, Hypotension, Peripheral Arterial Disease, Peripheral Venous Disease, Phlebitis, Vasculitis Gastrointestinal Patient has history of Cirrhosis Denies history of Colitis, Crohn s, Hepatitis A, Hepatitis B, Hepatitis C Endocrine Patient has history of Type II Diabetes Denies history of Type I Diabetes Genitourinary Denies history of End Stage Renal Disease Immunological Denies history of Lupus Erythematosus, Raynaud s, Scleroderma Integumentary (Skin) Denies history of History of Burn, History of pressure wounds Musculoskeletal Patient has history of Osteoarthritis Denies history of Gout, Rheumatoid Arthritis, Osteomyelitis Neurologic Patient has history of  Neuropathy Denies history of Dementia, Quadriplegia, Paraplegia, Seizure Disorder Brittney [name], Brittney [name] ([id number]) Oncologic Denies history of Received Chemotherapy, Received Radiation Psychiatric Denies history of Anorexia/bulimia, Confinement Anxiety Patient is treated with Insulin. Medical And Surgical History Notes Oncologic melanoma Review of Systems (ROS) Constitutional Symptoms (General Health) Denies complaints or symptoms of Fatigue, Fever, Chills, Marked Weight Change. Eyes Denies complaints or symptoms of Dry Eyes, Vision Changes, Glasses / Contacts. Ear/Nose/Mouth/Throat Denies complaints or symptoms of Difficult clearing ears, Sinusitis. Hematologic/Lymphatic Denies complaints or symptoms of Bleeding / Clotting Disorders, Human Immunodeficiency Virus. Respiratory Complains or has symptoms of Chronic or frequent coughs, Shortness of Breath. Cardiovascular Complains or has symptoms of Chest pain, LE edema. Gastrointestinal Denies complaints or symptoms of Frequent diarrhea, Nausea, Vomiting. Endocrine Denies  complaints or symptoms of Hepatitis, Thyroid disease, Polydypsia (Excessive Thirst). Genitourinary Complains or has symptoms of Kidney failure/ Dialysis - CKD stage 2. Denies complaints or symptoms of Incontinence/dribbling. Immunological Denies complaints or symptoms of Hives, Itching. Integumentary (Skin) Complains or has symptoms of Wounds. Denies complaints or symptoms of Bleeding or bruising tendency, Breakdown, Swelling. Musculoskeletal Complains or has symptoms of Muscle Pain, Muscle Weakness. Neurologic Denies complaints or symptoms of Numbness/parasthesias, Focal/Weakness. Psychiatric Complains or has symptoms of Anxiety. Denies complaints or symptoms of Claustrophobia. Objective Constitutional patient is hypertensive.. pulse regular and within target range for patient.Marland Kitchen respirations regular, non-labored and within target range for patient.Marland Kitchen temperature within target range for patient.. Well-nourished and well-hydrated in no acute distress. Vitals Time Taken: 2:38 PM, Height: 66 in, Source: Stated, Weight: 329 lbs, Source: Stated, BMI: 53.1, Temperature: 98.6 F, Pulse: 61 bpm, Respiratory Rate: 20 breaths/min, Blood Pressure: 149/72 mmHg. Eyes conjunctiva clear no eyelid edema noted. pupils equal round and reactive to light and accommodation. Ears, Nose, Mouth, and Throat no gross abnormality of ear auricles or external auditory canals. normal hearing noted during conversation. mucus membranes moist. Respiratory normal breathing without difficulty. Cardiovascular 2+ dorsalis pedis/posterior tibialis pulses. 1+ pitting edema of the bilateral lower extremities. Musculoskeletal normal gait and posture. no significant deformity or arthritic changes, no loss or range of motion, no clubbing. Psychiatric Brittney [name], Brittney [name] ([id number]) this patient is able to make decisions and demonstrates good insight into disease process. Alert and Oriented x 3. pleasant and  cooperative. General Notes: Upon evaluation today patient is pretty much eschar covered across the board with regard to her wound. I do believe this is going to require sharp debridement to clear away the eschar in order to try and get this area to heal more effectively and quickly. All that was discussed with the patient and her daughter today they are in agreement with the plan. Following I did actually perform sharp debridement to clear this away. She did have a small arterial noted on the roughly 1:00 location that bled most significantly during the procedure everything else was fairly minute. I did use silver nitrate and a total of 4 sticks of her to achieve hemostasis the good news is complete hemostasis was achieved. She overall I think did quite well with the procedure and seems to have good blood flow to the extremity I feel like she is going to heal quite nicely is just to take some time I discussed all this with her today. Integumentary (Hair, Skin) Wound #1 status is Open. Original cause of wound was Hematoma. The date acquired was: 04/26/2021. The wound is located on the Right Lower Leg. The wound measures 8cm length x 5cm width x 0.7cm depth; 31.416cm^2 area and 21.991cm^3  volume. There is Fat Layer (Subcutaneous Tissue) exposed. There is no tunneling or undermining noted. There is a large amount of sanguinous drainage noted. There is no granulation within the wound bed. There is no necrotic tissue within the wound bed. Assessment Active Problems ICD-10 Contusion of right lower leg, initial encounter Non-pressure chronic ulcer of other part of right lower leg with unspecified severity Essential (primary) hypertension Chronic kidney disease, stage 2 (mild) Chronic obstructive pulmonary disease, unspecified Type 2 diabetes mellitus with other skin ulcer Procedures Wound #1 Pre-procedure diagnosis of Wound #1 is a Trauma, Other located on the Right Lower Leg . There was a Excisional  Skin/Subcutaneous Tissue Debridement with a total area of 40 sq cm performed by Tommie Sams., PA-C. With the following instrument(s): Forceps, and Scissors to remove Viable and Non-Viable tissue/material. Material removed includes Blood Clots, Eschar, Subcutaneous Tissue, Slough, Skin: Dermis, and Skin: Epidermis after achieving pain control using Lidocaine 2% Topical Gel. A time out was conducted at 13:23, prior to the start of the procedure. A Moderate amount of bleeding was controlled with Silver Nitrate. The procedure was tolerated well. Post Debridement Measurements: 8cm length x 5cm width x 2.1cm depth; 65.973cm^3 volume. Character of Wound/Ulcer Post Debridement is improved. Post procedure Diagnosis Wound #1: Same as Pre-Procedure Plan Follow-up Appointments: Return Appointment in 1 week. Nurse Visit as needed Home Health: [facility] for wound care. May utilize formulary equivalent dressing for wound treatment orders unless otherwise specified. Home Health Nurse may visit [facility] to address patient s wound care needs. - [facility]-follow wound care orders Bathing/ Shower/ Hygiene: May shower with wound dressing protected with water repellent cover or cast protector. No tub bath. Additional Orders / Instructions: Follow Nutritious Diet and Increase Protein Intake The following medication(s) was prescribed: Dakin's Solution miscellaneous 0.25 % solution Moisten gauze with Dakin's solution then wring out leaving gauze damp not saturated before packing into the wound bed lightly daily starting 06/22/2021 WOUND #1: - Lower Leg Wound Laterality: Right Cleanser: Normal Saline ([place]) 1 x Per Day/30 Days Discharge Instructions: Wash your hands with soap and water. Remove old dressing, discard into plastic bag and place into trash. Cleanse the wound with Normal Saline prior to applying a clean dressing using gauze sponges, not tissues or cotton balls. Do not scrub or  use excessive force. Pat dry using gauze sponges, not tissue or cotton balls. Brittney [name], Brittney [name] ([id number]) Primary Dressing: Gauze ([place]) 1 x Per Day/30 Days Discharge Instructions: As directed: dry, moistened with Dakins Solution Secondary Dressing: Xtrasorb Medium 4x5 (in/in) (Home Health) 1 x Per Day/30 Days Discharge Instructions: Apply to wound as directed. Do not cut. Secured With: 75M Medipore H Soft Cloth Surgical Tape, 2x2 (in/yd) (Home Health) 1 x Per Day/30 Days Secured With: Kerlix Roll Sterile or Non-Sterile 6-ply 4.5x4 (yd/yd) (Home Health) 1 x Per Day/30 Days Discharge Instructions: LIGHTLY TO SECURE DRESSING/Apply Kerlix as directed Secured With: Tubigrip Size E, 3.5x10 (in/yds) (Home Health) 1 x Per Day/30 Days Discharge Instructions: Apply 3 Tubigrip E 3-finger-widths below knee to base of toes to secure dressing and/or for swelling. 1. Would recommend currently that we initiate treatment with daily Dakin's moistened gauze packing to the wound bed. I think that this is probably be the best thing to help clean up this and get her moving in a more appropriate direction. The patient voiced understanding. 2. I am also can recommend at this time that we have the patient go ahead and continue to  monitor for any signs of worsening or infection such as increased pain, drainage, redness erythema surrounding that is spreading up her leg, or anything else that has her concern such as fevers, chills, nausea, vomiting, or diarrhea. If any of this occurs she should let me know soon as possible. We will see patient back for reevaluation in 1 week here in the clinic. If anything worsens or changes patient will contact our office for additional recommendations. Electronic Signature(s) Signed: 06/22/2021 5:36:27 PM By: Worthy Keeler PA-C Entered By: Worthy Keeler on 06/22/2021 17:36:26 Brittney [name]  ([id number]) -------------------------------------------------------------------------------- ROS/PFSH Details Patient Name: DAYELIN, BALDUCCI B. Date of Service: 06/22/2021 2:15 PM Medical Record Number: [id number] Patient Account Number: [id number] Date of Birth/Sex: [birth year] (70 y.o. Female) Treating RN: Dolan Amen Primary Care Provider: Emily Filbert Other Clinician: Referring Provider: Grayland Ormond Treating Provider/Extender: Skipper Cliche in Treatment: 0 Information Obtained From Patient Constitutional Symptoms (General Health) Complaints and Symptoms: Negative for: Fatigue; Fever; Chills; Marked Weight Change Eyes Complaints and Symptoms: Negative for: Dry Eyes; Vision Changes; Glasses / Contacts Medical History: Negative for: Cataracts; Glaucoma; Optic Neuritis Ear/Nose/Mouth/Throat Complaints and Symptoms: Negative for: Difficult clearing ears; Sinusitis Medical History: Negative for: Chronic sinus problems/congestion; Middle ear problems Hematologic/Lymphatic Complaints and Symptoms: Negative for: Bleeding / Clotting Disorders; Human Immunodeficiency Virus Medical History: Positive for: Anemia; Lymphedema Negative for: Hemophilia; Human Immunodeficiency Virus; Sickle Cell Disease Respiratory Complaints and Symptoms: Positive for: Chronic or frequent coughs; Shortness of Breath Medical History: Positive for: Chronic Obstructive Pulmonary Disease (COPD); Sleep Apnea Negative for: Aspiration; Asthma; Pneumothorax; Tuberculosis Cardiovascular Complaints and Symptoms: Positive for: Chest pain; LE edema Medical History: Positive for: Hypertension; Myocardial Infarction - 2015 Negative for: Angina; Arrhythmia; Congestive Heart Failure; Coronary Artery Disease; Deep Vein Thrombosis; Hypotension; Peripheral Arterial Disease; Peripheral Venous Disease; Phlebitis; Vasculitis Gastrointestinal Complaints and Symptoms: Negative for: Frequent diarrhea; Nausea;  Vomiting Medical History: Positive for: Cirrhosis Negative for: Colitis; Crohnos; Hepatitis A; Hepatitis B; Hepatitis C [name], [name] 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 Negative for: Incontinence/dribbling Medical History: Negative for: End Stage Renal Disease Immunological Complaints and Symptoms: Negative for: Hives; Itching Medical History: Negative for: Lupus Erythematosus; Raynaudos; Scleroderma Integumentary (Skin) Complaints and Symptoms: Positive for: Wounds Negative for: Bleeding or bruising tendency; Breakdown; Swelling Medical History: Negative for: History of Burn; History of pressure wounds Musculoskeletal Complaints and Symptoms: Positive for: Muscle Pain; Muscle Weakness Medical History: Positive for: Osteoarthritis Negative for: Gout; Rheumatoid Arthritis; Osteomyelitis Neurologic Complaints and Symptoms: Negative for: Numbness/parasthesias; Focal/Weakness Medical History: Positive for: Neuropathy Negative for: Dementia; Quadriplegia; Paraplegia; Seizure Disorder Psychiatric Complaints and Symptoms: Positive for: Anxiety Negative for: Claustrophobia Medical History: Negative for: Anorexia/bulimia; Confinement Anxiety Oncologic Medical History: Negative for: Received Chemotherapy; Received Radiation Past Medical History Notes: melanoma Brittney [name], Brittney [name] ([id number]) Immunizations Pneumococcal Vaccine: Received Pneumococcal Vaccination: No Implantable Devices Yes Family and Social History Never smoker; Alcohol Use: Never; Drug Use: No History; Caffeine Use: Never Electronic Signature(s) Signed: 06/22/2021 4:30:50 PM By: Georges Mouse,  Minus Breeding RN Signed: 06/22/2021 5:58:21 PM By: Worthy Keeler PA-C Entered By: Georges Mouse, Minus Breeding on 06/22/2021 15:16:05 Brittney [name] ([id number]) -------------------------------------------------------------------------------- SuperBill Details Patient Name: Brittney Gentles B. Date of Service: 06/22/2021 Medical Record Number: [id number] Patient Account Number: [id number] Date of Birth/Sex: April 11, 1951 (  70 y.o. Female) Treating RN: Donnamarie Poag Primary Care Provider: Emily Filbert Other Clinician: Referring Provider: Grayland Ormond Treating Provider/Extender: Skipper Cliche in Treatment: 0 Diagnosis Coding ICD-10 Codes Code Description S80.11XA Contusion of right lower leg, initial encounter 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 with unspecified severity I10 Essential (primary) hypertension N18.2 Chronic kidney disease, stage 2 (mild) J44.9 Chronic obstructive pulmonary disease, unspecified E11.622 Type 2 diabetes mellitus with other skin ulcer Facility Procedures CPT4 Code: 75883254 Description: 98264 - DEB SUBQ TISSUE 20 SQ CM/< Modifier: Quantity: 1 CPT4 Code: Description: ICD-10 Diagnosis Description 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 with unspecif Modifier: ied severity Quantity: CPT4 Code: 15830940 Description: 76808 - DEB SUBQ TISS EA ADDL 20CM Modifier: Quantity: 1 CPT4 Code: Description: ICD-10 Diagnosis Description 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 with unspecif Modifier: ied severity Quantity: Physician Procedures CPT4 Code: 8110315 Description: 11042 - WC PHYS SUBQ TISS 20 SQ CM Modifier: Quantity: 1 CPT4 Code: Description: ICD-10 Diagnosis Description 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

## 2021-06-30 ENCOUNTER — Encounter: Payer: Medicare Other | Attending: Physician Assistant | Admitting: Physician Assistant

## 2021-06-30 ENCOUNTER — Other Ambulatory Visit: Payer: Self-pay

## 2021-06-30 DIAGNOSIS — I129 Hypertensive chronic kidney disease with stage 1 through stage 4 chronic kidney disease, or unspecified chronic kidney disease: Secondary | ICD-10-CM | POA: Diagnosis not present

## 2021-06-30 DIAGNOSIS — L97819 Non-pressure chronic ulcer of other part of right lower leg with unspecified severity: Secondary | ICD-10-CM | POA: Diagnosis not present

## 2021-06-30 DIAGNOSIS — E1140 Type 2 diabetes mellitus with diabetic neuropathy, unspecified: Secondary | ICD-10-CM | POA: Diagnosis not present

## 2021-06-30 DIAGNOSIS — E1122 Type 2 diabetes mellitus with diabetic chronic kidney disease: Secondary | ICD-10-CM | POA: Insufficient documentation

## 2021-06-30 DIAGNOSIS — E11622 Type 2 diabetes mellitus with other skin ulcer: Secondary | ICD-10-CM | POA: Insufficient documentation

## 2021-06-30 DIAGNOSIS — N182 Chronic kidney disease, stage 2 (mild): Secondary | ICD-10-CM | POA: Insufficient documentation

## 2021-06-30 NOTE — Progress Notes (Signed)
DAMYIA, STRIDER ([id number]) Visit Report for 06/30/2021 Arrival Information Details Patient Name: Brittney [name], Brittney [name]. Date of Service: 06/30/2021 3:45 PM Medical Record Number: [id number] Patient Account Number: [id number] Date of Birth/Sex: 11-15-1951 (70 y.o. F) Treating RN: Dolan Amen Primary Care Fantasia Jinkins: Emily Filbert Other Clinician: Referring Jerriann Schrom: Emily Filbert Treating Jaedynn Bohlken/Extender: Skipper Cliche in Treatment: 1 Visit Information History Since Last Visit Pain Present Now: Yes Patient Arrived: Wheel Chair Arrival Time: 16:00 Accompanied By: daughter Transfer Assistance: Manual Patient Identification Verified: Yes Secondary Verification Process Completed: Yes Patient Has Alerts: Yes Patient Alerts: Type II Diabetic Electronic Signature(s) Signed: 06/30/2021 4:49:05 PM By: Dolan Amen RN Entered By: Dolan Amen on 06/30/2021 16:02:54 Brittney [name] ([id number]) -------------------------------------------------------------------------------- Clinic Level of Care Assessment Details Patient Name: Brittney Gentles B. Date of Service: 06/30/2021 3:45 PM Medical Record Number: [id number] Patient Account Number: [id number] Date of Birth/Sex: [birth year] (70 y.o. F) Treating RN: Dolan Amen Primary Care Ayano Douthitt: Emily Filbert Other Clinician: Referring Miyoshi Ligas: Emily Filbert Treating Shaquan Missey/Extender: Skipper Cliche in Treatment: 1 Clinic Level of Care Assessment Items TOOL 1 Quantity Score []  - Use when EandM and Procedure is performed on INITIAL visit 0 ASSESSMENTS - Nursing Assessment / Reassessment []  - General Physical Exam (combine w/ comprehensive assessment (listed just below) when performed on new 0 pt. evals) []  - 0 Comprehensive Assessment (HX, ROS, Risk Assessments, Wounds Hx, etc.) ASSESSMENTS - Wound and Skin Assessment / Reassessment []  - Dermatologic / Skin Assessment (not related to wound area) 0 ASSESSMENTS - Ostomy and/or Continence  Assessment and Care []  - Incontinence Assessment and Management 0 []  - 0 Ostomy Care Assessment and Management (repouching, etc.) PROCESS - Coordination of Care []  - Simple Patient / Family Education for ongoing care 0 []  - 0 Complex (extensive) Patient / Family Education for ongoing care []  - 0 Staff obtains Programmer, systems, Records, Test Results / Process Orders []  - 0 Staff telephones HHA, Nursing Homes / Clarify orders / etc []  - 0 Routine Transfer to another Facility (non-emergent condition) []  - 0 Routine Hospital Admission (non-emergent condition) []  - 0 New Admissions / Biomedical engineer / Ordering NPWT, Apligraf, etc. []  - 0 Emergency Hospital Admission (emergent condition) PROCESS - Special Needs []  - Pediatric / Minor Patient Management 0 []  - 0 Isolation Patient Management []  - 0 Hearing / Language / Visual special needs []  - 0 Assessment of Community assistance (transportation, D/C planning, etc.) []  - 0 Additional assistance / Altered mentation []  - 0 Support Surface(s) Assessment (bed, cushion, seat, etc.) INTERVENTIONS - Miscellaneous []  - External ear exam 0 []  - 0 Patient Transfer (multiple staff / Civil Service fast streamer / Similar devices) []  - 0 Simple Staple / Suture removal (25 or less) []  - 0 Complex Staple / Suture removal (26 or more) []  - 0 Hypo/Hyperglycemic Management (do not check if billed separately) []  - 0 Ankle / Brachial Index (ABI) - do not check if billed separately Has the patient been seen at the hospital within the last three years: Yes Total Score: 0 Level Of Care: ____ Brittney [name] ([id number]) Electronic Signature(s) Signed: 06/30/2021 4:49:05 PM By: Dolan Amen RN Entered By: Dolan Amen on 06/30/2021 16:27:33 Brittney [name] ([id number]) -------------------------------------------------------------------------------- Compression Therapy Details Patient Name: Brittney Gentles B. Date of Service: 06/30/2021 3:45 PM Medical  Record Number: [id number] Patient Account Number: [id number] Date of Birth/Sex: [birth year] (70 y.o. F) Treating RN: Dolan Amen Primary Care Keniesha Adderly: Emily Filbert Other Clinician: Referring Melonie Germani: Emily Filbert Treating  Efe Fazzino/Extender: Jeri Cos Weeks in Treatment: 1 Compression Therapy Performed for Wound Assessment: Wound #1 Right Lower Leg Performed By: Clinician Dolan Amen, RN Compression Type: Rolena Infante Post Procedure Diagnosis Same as Pre-procedure Electronic Signature(s) Signed: 06/30/2021 4:49:05 PM By: Dolan Amen RN Entered By: Dolan Amen on 06/30/2021 16:23:16 Brittney [name] ([id number]) -------------------------------------------------------------------------------- Encounter Discharge Information Details Patient Name: Brittney [name], [name] Date of Service: 06/30/2021 3:45 PM Medical Record Number: [id number] Patient Account Number: [id number] Date of Birth/Sex: [birth year] (70 y.o. F) Treating RN: Dolan Amen Primary Care Jhamir Pickup: Emily Filbert Other Clinician: Referring Naheim Burgen: Emily Filbert Treating Tavius Turgeon/Extender: Skipper Cliche in Treatment: 1 Encounter Discharge Information Items Discharge Condition: Stable Ambulatory Status: Wheelchair Discharge Destination: Home Transportation: Private Auto Accompanied By: daughter Schedule Follow-up Appointment: Yes Clinical Summary of Care: Electronic Signature(s) Signed: 06/30/2021 4:49:05 PM By: Dolan Amen RN Entered By: Dolan Amen on 06/30/2021 16:45:41 Brittney [name] ([id number]) -------------------------------------------------------------------------------- Lower Extremity Assessment Details Patient Name: Brittney [name], [name] Date of Service: 06/30/2021 3:45 PM Medical Record Number: [id number] Patient Account Number: [id number] Date of Birth/Sex: 10-02-1951 (70 y.o. F) Treating RN: Dolan Amen Primary Care Leoma Folds: Emily Filbert Other Clinician: Referring Tracye Szuch: Emily Filbert Treating Annisten Manchester/Extender: Jeri Cos Weeks in Treatment: 1 Edema Assessment Assessed: [Left: No] [Right: Yes] Edema: [Left: Ye] [Right: s] Calf Left: Right: Point of Measurement: 31 cm From Medial Instep 52.3 cm Ankle Left: Right: Point of Measurement: 12 cm From Medial Instep 36 cm Vascular Assessment Pulses: Dorsalis Pedis Palpable: [Right:No] Electronic Signature(s) Signed: 06/30/2021 4:49:05 PM By: Dolan Amen RN Entered By: Dolan Amen on 06/30/2021 16:15:05 Brittney [name] ([id number]) -------------------------------------------------------------------------------- Multi Wound Chart Details Patient Name: Brittney Gentles B. Date of Service: 06/30/2021 3:45 PM Medical Record Number: [id number] Patient Account Number: [id number] Date of Birth/Sex: 08/08/1951 (70 y.o. F) Treating RN: Dolan Amen Primary Care Sofija Antwi: Emily Filbert Other Clinician: Referring Thanvi Blincoe: Emily Filbert Treating Millena Callins/Extender: Skipper Cliche in Treatment: 1 Vital Signs Height(in): 71 Pulse(bpm): 78 Weight(lbs): 329 Blood Pressure(mmHg): 115/62 Body Mass Index(BMI): 53 Temperature(F): 98.5 Respiratory Rate(breaths/min): 20 Photos: [N/A:N/A] Wound Location: Right Lower Leg N/A N/A Wounding Event: Hematoma N/A N/A Primary Etiology: Trauma, Other N/A N/A Comorbid History: Anemia, Lymphedema, Chronic N/A N/A Obstructive Pulmonary Disease (COPD), Sleep Apnea, Hypertension, Myocardial Infarction, Cirrhosis , Type II Diabetes, Osteoarthritis, Neuropathy Date Acquired: 04/26/2021 N/A N/A Weeks of Treatment: 1 N/A N/A Wound Status: Open N/A N/A Clustered Wound: Yes N/A N/A Clustered Quantity: 2 N/A N/A Measurements L x W x D (cm) 8x6.5x1.8 N/A N/A Area (cm) : 40.841 N/A N/A Volume (cm) : 73.513 N/A N/A % Reduction in Area: -30.00% N/A N/A % Reduction in Volume: -234.30% N/A N/A Classification: Full Thickness Without Exposed N/A N/A Support Structures Exudate  Amount: Large N/A N/A Exudate Type: Sanguinous N/A N/A Exudate Color: red N/A N/A Granulation Amount: Large (67-100%) N/A N/A Granulation Quality: Red, Friable N/A N/A Necrotic Amount: Small (1-33%) N/A N/A Exposed Structures: Fat Layer (Subcutaneous Tissue): N/A N/A Yes Fascia: No Tendon: No Muscle: No Joint: No Bone: No Epithelialization: Medium (34-66%) N/A N/A Treatment Notes Electronic Signature(s) Signed: 06/30/2021 4:49:05 PM By: Dolan Amen RN Brittney [name], [name] ([id number]) Entered By: Dolan Amen on 06/30/2021 16:20:21 Brittney [name] ([id number]) -------------------------------------------------------------------------------- [place] Details Patient Name: Brittney [name], [name] Date of Service: 06/30/2021 3:45 PM Medical Record Number: [id number] Patient Account Number: [id number] Date of Birth/Sex: 09/08/1951 (70 y.o. F) Treating RN: Dolan Amen Primary Care Leanor Voris: Emily Filbert Other  Clinician: Referring Carolanne Mercier: Emily Filbert Treating Dejae Bernet/Extender: Skipper Cliche in Treatment: 1 Active Inactive Wound/Skin Impairment Nursing Diagnoses: Impaired tissue integrity Knowledge deficit related to smoking impact on wound healing Knowledge deficit related to ulceration/compromised skin integrity Goals: Ulcer/skin breakdown will have a volume reduction of 30% by week 4 Date Initiated: 06/22/2021 Target Resolution Date: 07/20/2021 Goal Status: Active Ulcer/skin breakdown will have a volume reduction of 50% by week 8 Date Initiated: 06/22/2021 Target Resolution Date: 08/17/2021 Goal Status: Active Ulcer/skin breakdown will have a volume reduction of 80% by week 12 Date Initiated: 06/22/2021 Target Resolution Date: 09/14/2021 Goal Status: Active Interventions: Assess patient/caregiver ability to obtain necessary supplies Assess patient/caregiver ability to perform ulcer/skin care regimen upon admission and as needed Assess ulceration(s)  every visit Notes: Electronic Signature(s) Signed: 06/30/2021 4:49:05 PM By: Dolan Amen RN Entered By: Dolan Amen on 06/30/2021 16:20:10 Brittney [name] ([id number]) -------------------------------------------------------------------------------- Pain Assessment Details Patient Name: Brittney Gentles B. Date of Service: 06/30/2021 3:45 PM Medical Record Number: [id number] Patient Account Number: [id number] Date of Birth/Sex: [birth year] (70 y.o. F) Treating RN: Dolan Amen Primary Care Manveer Gomes: Emily Filbert Other Clinician: Referring Jaida Basurto: Emily Filbert Treating Matilyn Fehrman/Extender: Skipper Cliche in Treatment: 1 Active Problems Location of Pain Severity and Description of Pain Patient Has Paino Yes Site Locations Pain Location: Pain in Ulcers Rate the pain. Current Pain Level: 7 Pain Management and Medication Current Pain Management: Electronic Signature(s) Signed: 06/30/2021 4:49:05 PM By: Dolan Amen RN Entered By: Dolan Amen on 06/30/2021 16:03:28 Brittney [name] ([id number]) -------------------------------------------------------------------------------- Patient/Caregiver Education Details Patient Name: Brittney [name] Date of Service: 06/30/2021 3:45 PM Medical Record Number: [id number] Patient Account Number: [id number] Date of Birth/Gender: [birth year] (70 y.o. F) Treating RN: Dolan Amen Primary Care Physician: Emily Filbert Other Clinician: Referring Physician: Emily Filbert Treating Physician/Extender: Skipper Cliche in Treatment: 1 Education Assessment Education Provided To: Patient Education Topics Provided Wound/Skin Impairment: Methods: Explain/Verbal Responses: State content correctly Electronic Signature(s) Signed: 06/30/2021 4:49:05 PM By: Dolan Amen RN Entered By: Dolan Amen on 06/30/2021 16:45:11 Brittney [name] ([id number]) -------------------------------------------------------------------------------- Wound  Assessment Details Patient Name: Brittney [name], [name] Date of Service: 06/30/2021 3:45 PM Medical Record Number: [id number] Patient Account Number: [id number] Date of Birth/Sex: 06/17/1951 (70 y.o. F) Treating RN: Dolan Amen Primary Care Lena Fieldhouse: Emily Filbert Other Clinician: Referring Betsy Rosello: Emily Filbert Treating Su Duma/Extender: Skipper Cliche in Treatment: 1 Wound Status Wound Number: 1 Primary Trauma, Other Etiology: Wound Location: Right Lower Leg Wound Open Wounding Event: Hematoma Status: Date Acquired: 04/26/2021 Comorbid Anemia, Lymphedema, Chronic Obstructive Pulmonary Weeks Of Treatment: 1 History: Disease (COPD), Sleep Apnea, Hypertension, Myocardial Clustered Wound: Yes Infarction, Cirrhosis , Type II Diabetes, Osteoarthritis, Neuropathy Photos Wound Measurements Length: (cm) 8 Width: (cm) 6.5 Depth: (cm) 1.8 Clustered Quantity: 2 Area: (cm) 40.841 Volume: (cm) 73.513 % Reduction in Area: -30% % Reduction in Volume: -234.3% Epithelialization: Medium (34-66%) Tunneling: No Undermining: No Wound Description Classification: Full Thickness Without Exposed Support Structures Exudate Amount: Large Exudate Type: Sanguinous Exudate Color: red Foul Odor After Cleansing: No Slough/Fibrino No Wound Bed Granulation Amount: Large (67-100%) Exposed Structure Granulation Quality: Red, Friable Fascia Exposed: No Necrotic Amount: Small (1-33%) Fat Layer (Subcutaneous Tissue) Exposed: Yes Necrotic Quality: Adherent Slough Tendon Exposed: No Muscle Exposed: No Joint Exposed: No Bone Exposed: No Treatment Notes Wound #1 (Lower Leg) Wound Laterality: Right Cleanser Soap and Water Discharge Instruction: Gently cleanse wound with antibacterial soap, rinse and pat dry prior to dressing wounds Brittney [name], [name] (  [id number]) Peri-Wound Care Topical Primary Dressing Silvercel 4 1/4x 4 1/4 (in/in) Discharge Instruction: Apply Silvercel to wound bed-make sure to  tuck in depth Secondary Dressing Xtrasorb Medium 4x5 (in/in) Discharge Instruction: Apply to wound as directed. Do not cut. Secured With Compression Wrap Unna Boot 4x10 (in/yd) Discharge Instruction: Unna Paste, Kerlix and Coban from base of toes to three finger widths below bend in knee. Compression Stockings Add-Ons Electronic Signature(s) Signed: 06/30/2021 4:49:05 PM By: Dolan Amen RN Entered By: Dolan Amen on 06/30/2021 16:11:21 Brittney [name] ([id number]) -------------------------------------------------------------------------------- [place] Details Patient Name: Brittney Gentles B. Date of Service: 06/30/2021 3:45 PM Medical Record Number: [id number] Patient Account Number: [id number] Date of Birth/Sex: Jun 20, 1951 (70 y.o. F) Treating RN: Dolan Amen Primary Care Meagan Ancona: Emily Filbert Other Clinician: Referring Analyssa Downs: Emily Filbert Treating Decklin Weddington/Extender: Skipper Cliche in Treatment: 1 Vital Signs Time Taken: 16:02 Temperature (F): 98.5 Height (in): 66 Pulse (bpm): 64 Weight (lbs): 329 Respiratory Rate (breaths/min): 20 Body Mass Index (BMI): 53.1 Blood Pressure (mmHg): 115/62 Reference Range: 80 - 120 mg / dl Electronic Signature(s) Signed: 06/30/2021 4:49:05 PM By: Dolan Amen RN Entered By: Dolan Amen on 06/30/2021 16:03:11

## 2021-06-30 NOTE — Progress Notes (Addendum)
NOOR, WITTE ([id number]) Visit Report for 06/30/2021 Chief Complaint Document Details Patient Name: Brittney [name], Brittney [name]. Date of Service: 06/30/2021 3:45 PM Medical Record Number: [id number] Patient Account Number: [id number] Date of Birth/Sex: [birth year] (70 y.o. F) Treating RN: Dolan Amen Primary Care Provider: Emily Filbert Other Clinician: Referring Provider: Emily Filbert Treating Provider/Extender: Skipper Cliche in Treatment: 1 Information Obtained from: Patient Chief Complaint Right LE Hematoma following contusion Electronic Signature(s) Signed: 06/30/2021 4:15:44 PM By: Worthy Keeler PA-C Entered By: Worthy Keeler on 06/30/2021 16:15:44 [name] ([id number]) -------------------------------------------------------------------------------- HPI Details Patient Name: Brittney [name], [name] Date of Service: 06/30/2021 3:45 PM Medical Record Number: [id number] Patient Account Number: [id number] Date of Birth/Sex: 1951-10-08 (70 y.o. F) Treating RN: Dolan Amen Primary Care Provider: Emily Filbert Other Clinician: Referring Provider: Emily Filbert Treating Provider/Extender: Skipper Cliche in Treatment: 1 History of Present Illness HPI Description: 06/22/2021 upon evaluation today patient appears to be doing somewhat poorly in regard to the wound that actually started around the beginning of May. She got a an IT trainer wheelchair which she tells me was for the most part a blessing. With that being said she unfortunately had an issue where she was in the bathroom and subsequently had left it on when she stopped to do something at the counter. In the end she actually had some issues here with running into the cabinet. She tells me that she actually hit the cabinet a couple times and could not get the chair to back up and to stop therefore she had quite a bit of damage to her leg. Her primary care provider put her in a boot on this trying to get it to reabsorb but  unfortunately that is just not the case and not what we see happen. Fortunately there does not appear to be any signs of active infection at this time she is on doxycycline for a sinus infection completely separate from this issue but nonetheless that does seem to be a good antibiotic generally for skin infections and I think is at least something good for her currently. She does have some discomfort. Her daughter is present during the evaluation today. I did explain to the patient and her daughter that I do believe her get have to evacuate the hematoma this is already open and to be honest its not getting healed very well until we clear this out. 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 With that being said her hemoglobin A1c is stated to be around 7.5 which is actually good and should not impede her healing this is great news. Overall I am pleased with what I am hearing in that regard. With that being said I still think this is going to take quite a while for this patient to heal we do need however to clear away the necrotic and nonviable/healthy tissue. 06/30/2021 upon evaluation today patient appears to be doing well with regard to her wound although there is some signs of infection. We did have to perform a fairly significant debridement last week. Obviously this has been somewhat painful for her which I think is part of the issue but the other part I think is simply that she may have an infection here which is problematic as well. We definitely need to try to do something to improve this. I did obtain a wound culture today we will see what that shows. Electronic  Signature(s) Signed: 06/30/2021 4:27:39 PM By: Worthy Keeler PA-C Entered By: Worthy Keeler on 06/30/2021 16:27:38 [name]  ([id number]) -------------------------------------------------------------------------------- Physical Exam Details Patient Name: Brittney [name], [name] Date of Service: 06/30/2021 3:45 PM Medical Record Number: [id number] Patient Account Number: [id number] Date of Birth/Sex: Aug 19, 1951 (70 y.o. F) Treating RN: Dolan Amen Primary Care Provider: Emily Filbert Other Clinician: Referring Provider: Emily Filbert Treating Provider/Extender: Skipper Cliche in Treatment: 1 Constitutional Obese and well-hydrated in no acute distress. Respiratory normal breathing without difficulty. Psychiatric this patient is able to make decisions and demonstrates good insight into disease process. Alert and Oriented x 3. pleasant and cooperative. Notes Upon inspection patient's wound bed actually showed signs of good granulation epithelization and some areas there was other areas where it was not quite as clean unfortunately. With all that being said I do believe that the patient would benefit from switching to a silver alginate dressing I think they can still clean this with Dakin solution and I think that we need to go back to using a Unna boot wrap at this point I think that is going to better than just spot wrapping with Kerlix and Coban. She is in agreement with all the above. Electronic Signature(s) Signed: 06/30/2021 4:28:13 PM By: Worthy Keeler PA-C Entered By: Worthy Keeler on 06/30/2021 16:28:13 [name] ([id number]) -------------------------------------------------------------------------------- Physician Orders Details Patient Name: Brittney [name], [name] Date of Service: 06/30/2021 3:45 PM Medical Record Number: [id number] Patient Account Number: [id number] Date of Birth/Sex: 1951-11-16 (70 y.o. F) Treating RN: Dolan Amen Primary Care Provider: Emily Filbert Other Clinician: Referring Provider: Emily Filbert Treating Provider/Extender: Skipper Cliche in Treatment: 1 Verbal / Phone  Orders: No Diagnosis Coding ICD-10 Coding Code Description S80.11XA Contusion of right lower leg, initial encounter 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 with unspecified severity I10 Essential (primary) hypertension N18.2 Chronic kidney disease, stage 2 (mild) J44.9 Chronic obstructive pulmonary disease, unspecified E11.622 Type 2 diabetes mellitus with other skin ulcer Follow-up Appointments o Return Appointment in 1 week. o Nurse Visit as needed [place] for wound care. May utilize formulary equivalent dressing for wound treatment orders unless otherwise specified. Home Health Nurse may visit [facility] to address patientos wound care needs. o Scheduled days for dressing changes to be completed; exception, patient has scheduled wound care visit that day. - ****Unna boot applied in office today, please schedule patient home visit for Friday***** Bathing/ Shower/ Hygiene o May shower with wound dressing protected with water repellent cover or cast protector. o No tub bath. Additional Orders / Instructions o Follow Nutritious Diet and Increase Protein Intake Medications-Please add to medication list. o P.O. Antibiotics Wound Treatment Wound #1 - Lower Leg Wound Laterality: Right Cleanser: Soap and Water 2 x Per Week/30 Days Discharge Instructions: Gently cleanse wound with antibacterial soap, rinse and pat dry prior to dressing wounds Primary Dressing: Silvercel 4 1/4x 4 1/4 (in/in) 2 x Per Week/30 Days Discharge Instructions: Apply Silvercel to wound bed-make sure to tuck in depth Secondary Dressing: Xtrasorb Medium 4x5 (in/in) (Home Health) 2 x Per Week/30 Days Discharge Instructions: Apply to wound as directed. Do not cut. Compression Wrap: Unna Boot 4x10 (in/yd) 2 x Per Week/30 Days Discharge Instructions: Unna Paste, Kerlix and Coban from base of toes to three finger widths  below bend in knee. Laboratory o Bacteria identified in Wound by Culture (MICRO) - Right leg oooo  LOINC Code: 8588-5 OYDX Convenience Name: Wound culture routine Brittney [name], Brittney [name] ([id number]) Patient Medications Allergies: codeine, adhesive, cephalexin, Imdur, Norvasc, pregabalin, Shellfish Containing Products, isosorbide, monosodium glutamate Notifications Medication Indication Start End Bactrim DS 06/30/2021 DOSE 1 - oral 800 mg-160 mg tablet - 1 tablet oral taken 2 times per day for 14 days. Do not take potassium while on this medication Electronic Signature(s) Signed: 06/30/2021 4:49:05 PM By: Dolan Amen RN Signed: 06/30/2021 4:57:45 PM By: Worthy Keeler PA-C Previous Signature: 06/30/2021 4:32:05 PM Version By: Worthy Keeler PA-C Entered By: Dolan Amen on 06/30/2021 16:46:58 [name] ([id number]) -------------------------------------------------------------------------------- Problem List Details Patient Name: Brittney [name], [name] Date of Service: 06/30/2021 3:45 PM Medical Record Number: [id number] Patient Account Number: [id number] Date of Birth/Sex: [birth year] (70 y.o. 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 with 06/22/2021 No Yes unspecified severity I10 Essential (primary) hypertension 06/22/2021 No Yes N18.2 Chronic kidney disease, stage 2 (mild) 06/22/2021 No Yes J44.9 Chronic obstructive pulmonary disease, unspecified 06/22/2021 No Yes E11.622 Type 2 diabetes mellitus with other skin ulcer 06/22/2021 No Yes Inactive Problems Resolved Problems Electronic Signature(s) Signed: 06/30/2021 4:15:39 PM By: Worthy Keeler PA-C Entered By: Worthy Keeler on 06/30/2021 16:15:38 [name] ([id number]) -------------------------------------------------------------------------------- Progress Note Details Patient Name: Brittney Gentles B. Date of Service: 06/30/2021 3:45 PM Medical Record Number: [id number] Patient Account Number: [id number] Date of Birth/Sex: 1951/10/23 (70 y.o. F) Treating RN: Dolan Amen Primary Care Provider: Emily Filbert Other Clinician: Referring Provider: Emily Filbert Treating Provider/Extender: Skipper Cliche in Treatment: 1 Subjective Chief Complaint Information obtained from Patient Right LE Hematoma following contusion History of Present Illness (HPI) 06/22/2021 upon evaluation today patient appears to be doing somewhat poorly in regard to the wound that actually started around the beginning of May. She got a an IT trainer wheelchair which she tells me was for the most part a blessing. With that being said she unfortunately had an issue where she was in the bathroom and subsequently had left it on when she stopped to do something at the counter. In the end she actually had some issues here with running into the cabinet. She tells me that she actually hit the cabinet a couple times and could not get the chair to back up and to stop therefore she had quite a bit of damage to her leg. Her primary care provider put her in a boot on this trying to get it to reabsorb but unfortunately that is just not the case and not what we see happen. Fortunately there does not appear to be any signs of active infection at this time she is on doxycycline for a sinus infection completely separate from this issue but nonetheless that does seem to be a good antibiotic generally for skin infections and I think is at least something good for her currently. She does have some discomfort. Her daughter is present during the evaluation today. I did explain to the patient and her daughter that I do  believe her get have to evacuate the hematoma this is already open and to be honest its not getting healed very well until we clear this out. 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 With that being said her hemoglobin A1c is stated to be around 7.5 which is actually good and should not impede her healing this is great news. Overall I am pleased with what I am hearing in that regard. With that being said I still think this is going to take quite a while for this patient to heal we do need however to clear away the necrotic and nonviable/healthy tissue. 06/30/2021 upon evaluation today patient appears to be doing well with regard to her wound although there is some signs of infection. We did have to perform a fairly significant debridement last week. Obviously this has been somewhat painful for her which I think is part of the issue but the other part I think is simply that she may have an infection here which is problematic as well. We definitely need to try to do something to improve this. I did obtain a wound culture today we will see what that shows. Objective Constitutional Obese and well-hydrated in no acute distress. Vitals Time Taken: 4:02 PM, Height: 66 in, Weight: 329 lbs, BMI: 53.1, Temperature: 98.5 F, Pulse: 64 bpm, Respiratory Rate: 20 breaths/min, Blood Pressure: 115/62 mmHg. Respiratory normal breathing without difficulty. Psychiatric this patient is able to make decisions and demonstrates good insight into disease process. Alert and Oriented x 3. pleasant and cooperative. General Notes: Upon inspection patient's wound bed actually showed signs of good granulation epithelization and some areas there was other areas where it was not quite as clean unfortunately. With all that being said I do believe that the patient would benefit from switching to a  silver alginate dressing I think they can still clean this with Dakin solution and I think that we need to go back to using a Unna boot wrap at this point I think that is going to better than just spot wrapping with Kerlix and Coban. She is in agreement with all the above. Integumentary (Hair, Skin) Wound #1 status is Open. Original cause of wound was Hematoma. The date acquired was: 04/26/2021. The wound has been in treatment 1 weeks. The wound is located on the Right Lower Leg. The wound measures 8cm length x 6.5cm width x 1.8cm depth; 40.841cm^2 area and 73.513cm^3 volume. There is Fat Layer (Subcutaneous Tissue) exposed. There is no tunneling or undermining noted. There is a large amount of sanguinous drainage noted. There is large (67-100%) red, friable granulation within the wound bed. There is a small (1-33%) amount of necrotic tissue within the wound bed including Adherent Slough. Brittney [name], Brittney [name] ([id number]) Assessment Active Problems ICD-10 Contusion of right lower leg, initial encounter Non-pressure chronic ulcer of other part of right lower leg with unspecified severity Essential (primary) hypertension Chronic kidney disease, stage 2 (mild) Chronic obstructive pulmonary disease, unspecified Type 2 diabetes mellitus with other skin ulcer Procedures Wound #1 Pre-procedure diagnosis of Wound #1 is a Trauma, Other located on the Right Lower Leg . There was a Haematologist Compression Therapy Procedure by Dolan Amen, RN. Post procedure Diagnosis Wound #1: Same as Pre-Procedure Plan Follow-up Appointments: Return Appointment in 1 week. Nurse Visit as needed Home Health: [facility]: - [facility] for wound care. May utilize formulary equivalent dressing for wound treatment orders unless otherwise specified. Home Health Nurse may visit [facility] to address patient s wound care needs. Scheduled days for dressing changes to be completed; exception, patient  has scheduled  wound care visit that day. Bathing/ Shower/ Hygiene: May shower with wound dressing protected with water repellent cover or cast protector. No tub bath. Additional Orders / Instructions: Follow Nutritious Diet and Increase Protein Intake Medications-Please add to medication list.: P.O. Antibiotics Laboratory ordered were: Wound culture routine - Right leg The following medication(s) was prescribed: Bactrim DS oral 800 mg-160 mg tablet 1 1 tablet oral taken 2 times per day for 14 days. Do not take potassium while on this medication starting 06/30/2021 WOUND #1: - Lower Leg Wound Laterality: Right Cleanser: Soap and Water 2 x Per Week/30 Days Discharge Instructions: Gently cleanse wound with antibacterial soap, rinse and pat dry prior to dressing wounds Primary Dressing: Silvercel 4 1/4x 4 1/4 (in/in) 2 x Per Week/30 Days Discharge Instructions: Apply Silvercel to wound bed-make sure to tuck in depth Secondary Dressing: Xtrasorb Medium 4x5 (in/in) (Home Health) 2 x Per Week/30 Days Discharge Instructions: Apply to wound as directed. Do not cut. Compression Wrap: Unna Boot 4x10 (in/yd) 2 x Per Week/30 Days Discharge Instructions: Unna Paste, Kerlix and Coban from base of toes to three finger widths below bend in knee. 1. Would recommend that we go ahead and initiate treatment with a initiation of an Unna boot wrap to the right leg. I think this is a good idea with the job for the patient and she is in agreement with that plan. She has had this before. 2. I am also can recommend that we go ahead and have the patient start and switch over to silver cell packed into the wound on the surface of the wound followed by XtraSorb to cover to catch any excessive drainage. 3. I am also can recommend the patient continue to try to elevate her legs much as possible she also does need to be taking the fluid pill which I think will help as well. We will see patient back for reevaluation in 1  week here in the clinic. If anything worsens or changes patient will contact our office for additional recommendations. Brittney [name], Brittney [name] ([id number]) Electronic Signature(s) Signed: 06/30/2021 4:32:23 PM By: Worthy Keeler PA-C Previous Signature: 06/30/2021 4:29:11 PM Version By: Worthy Keeler PA-C Entered By: Worthy Keeler on 06/30/2021 16:32:22 [name] ([id number]) -------------------------------------------------------------------------------- SuperBill Details Patient Name: Brittney [name], [name] Date of Service: 06/30/2021 Medical Record Number: [id number] Patient Account Number: [id number] Date of Birth/Sex: 1951/05/12 (69 y.o. F) Treating RN: Dolan Amen Primary Care Provider: Emily Filbert Other Clinician: Referring Provider: Emily Filbert Treating Provider/Extender: Skipper Cliche in Treatment: 1 Diagnosis Coding ICD-10 Codes Code Description S80.11XA Contusion of right lower leg, initial encounter 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 with unspecified severity I10 Essential (primary) hypertension N18.2 Chronic kidney disease, stage 2 (mild) J44.9 Chronic obstructive pulmonary disease, unspecified E11.622 Type 2 diabetes mellitus with other skin ulcer Facility Procedures CPT4 Code: [id number] Description: (Facility Use Only) [phone] - [place] LWR RT LEG Modifier: Quantity: 1 Physician Procedures CPT4 Code: [id number] Description: 92426 - WC PHYS LEVEL 4 - EST PT Modifier: Quantity: 1 CPT4 Code: Description: ICD-10 Diagnosis Description S80.11XA Contusion of right lower leg, initial encounter 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 with unspeci I10 Essential (primary) hypertension N18.2 Chronic kidney disease, stage 2  (mild) Modifier: fied severity Quantity: Electronic Signature(s) Signed: 06/30/2021 4:29:41 PM By: Worthy Keeler PA-C Entered By: Worthy Keeler on 06/30/2021 16:29:41

## 2021-07-01 ENCOUNTER — Other Ambulatory Visit
Admission: RE | Admit: 2021-07-01 | Discharge: 2021-07-01 | Disposition: A | Discharge status: Home / Self Care | Payer: Medicare Other | Source: Ambulatory Visit | Attending: Physician Assistant | Admitting: Physician Assistant

## 2021-07-01 DIAGNOSIS — L089 Local infection of the skin and subcutaneous tissue, unspecified: Secondary | ICD-10-CM | POA: Diagnosis present

## 2021-07-04 LAB — AEROBIC CULTURE W GRAM STAIN (SUPERFICIAL SPECIMEN)

## 2021-07-07 ENCOUNTER — Inpatient Hospital Stay
Admission: EM | Admit: 2021-07-07 | Discharge: 2021-07-13 | DRG: 603 | Disposition: A | Discharge status: Home / Self Care | Payer: Medicare Other | Attending: Internal Medicine | Admitting: Internal Medicine

## 2021-07-07 ENCOUNTER — Emergency Department: Payer: Medicare Other

## 2021-07-07 ENCOUNTER — Other Ambulatory Visit: Payer: Self-pay

## 2021-07-07 DIAGNOSIS — E1151 Type 2 diabetes mellitus with diabetic peripheral angiopathy without gangrene: Secondary | ICD-10-CM | POA: Diagnosis present

## 2021-07-07 DIAGNOSIS — Z79891 Long term (current) use of opiate analgesic: Secondary | ICD-10-CM

## 2021-07-07 DIAGNOSIS — Z85820 Personal history of malignant melanoma of skin: Secondary | ICD-10-CM

## 2021-07-07 DIAGNOSIS — Y92009 Unspecified place in unspecified non-institutional (private) residence as the place of occurrence of the external cause: Secondary | ICD-10-CM | POA: Diagnosis not present

## 2021-07-07 DIAGNOSIS — J449 Chronic obstructive pulmonary disease, unspecified: Secondary | ICD-10-CM | POA: Diagnosis present

## 2021-07-07 DIAGNOSIS — Z79899 Other long term (current) drug therapy: Secondary | ICD-10-CM

## 2021-07-07 DIAGNOSIS — Z7984 Long term (current) use of oral hypoglycemic drugs: Secondary | ICD-10-CM

## 2021-07-07 DIAGNOSIS — I2510 Atherosclerotic heart disease of native coronary artery without angina pectoris: Secondary | ICD-10-CM | POA: Diagnosis present

## 2021-07-07 DIAGNOSIS — I10 Essential (primary) hypertension: Secondary | ICD-10-CM | POA: Diagnosis present

## 2021-07-07 DIAGNOSIS — Z9981 Dependence on supplemental oxygen: Secondary | ICD-10-CM

## 2021-07-07 DIAGNOSIS — Z20822 Contact with and (suspected) exposure to covid-19: Secondary | ICD-10-CM | POA: Diagnosis present

## 2021-07-07 DIAGNOSIS — Z96652 Presence of left artificial knee joint: Secondary | ICD-10-CM | POA: Diagnosis present

## 2021-07-07 DIAGNOSIS — E1143 Type 2 diabetes mellitus with diabetic autonomic (poly)neuropathy: Secondary | ICD-10-CM | POA: Diagnosis present

## 2021-07-07 DIAGNOSIS — Z79890 Hormone replacement therapy (postmenopausal): Secondary | ICD-10-CM

## 2021-07-07 DIAGNOSIS — M1990 Unspecified osteoarthritis, unspecified site: Secondary | ICD-10-CM | POA: Diagnosis present

## 2021-07-07 DIAGNOSIS — Z9049 Acquired absence of other specified parts of digestive tract: Secondary | ICD-10-CM

## 2021-07-07 DIAGNOSIS — I878 Other specified disorders of veins: Secondary | ICD-10-CM | POA: Diagnosis present

## 2021-07-07 DIAGNOSIS — R601 Generalized edema: Secondary | ICD-10-CM

## 2021-07-07 DIAGNOSIS — I509 Heart failure, unspecified: Secondary | ICD-10-CM | POA: Diagnosis not present

## 2021-07-07 DIAGNOSIS — G8929 Other chronic pain: Secondary | ICD-10-CM | POA: Diagnosis present

## 2021-07-07 DIAGNOSIS — Z888 Allergy status to other drugs, medicaments and biological substances status: Secondary | ICD-10-CM

## 2021-07-07 DIAGNOSIS — W19XXXA Unspecified fall, initial encounter: Secondary | ICD-10-CM

## 2021-07-07 DIAGNOSIS — I83019 Varicose veins of right lower extremity with ulcer of unspecified site: Secondary | ICD-10-CM | POA: Diagnosis present

## 2021-07-07 DIAGNOSIS — L03116 Cellulitis of left lower limb: Secondary | ICD-10-CM | POA: Diagnosis present

## 2021-07-07 DIAGNOSIS — Z841 Family history of disorders of kidney and ureter: Secondary | ICD-10-CM

## 2021-07-07 DIAGNOSIS — Z9989 Dependence on other enabling machines and devices: Secondary | ICD-10-CM

## 2021-07-07 DIAGNOSIS — R296 Repeated falls: Secondary | ICD-10-CM | POA: Diagnosis present

## 2021-07-07 DIAGNOSIS — Z91048 Other nonmedicinal substance allergy status: Secondary | ICD-10-CM

## 2021-07-07 DIAGNOSIS — E785 Hyperlipidemia, unspecified: Secondary | ICD-10-CM | POA: Diagnosis present

## 2021-07-07 DIAGNOSIS — I83029 Varicose veins of left lower extremity with ulcer of unspecified site: Secondary | ICD-10-CM | POA: Diagnosis present

## 2021-07-07 DIAGNOSIS — Z7982 Long term (current) use of aspirin: Secondary | ICD-10-CM

## 2021-07-07 DIAGNOSIS — S82402A Unspecified fracture of shaft of left fibula, initial encounter for closed fracture: Secondary | ICD-10-CM

## 2021-07-07 DIAGNOSIS — Z9682 Presence of neurostimulator: Secondary | ICD-10-CM

## 2021-07-07 DIAGNOSIS — N1831 Chronic kidney disease, stage 3a: Secondary | ICD-10-CM

## 2021-07-07 DIAGNOSIS — I130 Hypertensive heart and chronic kidney disease with heart failure and stage 1 through stage 4 chronic kidney disease, or unspecified chronic kidney disease: Secondary | ICD-10-CM | POA: Diagnosis present

## 2021-07-07 DIAGNOSIS — Z6841 Body Mass Index (BMI) 40.0 and over, adult: Secondary | ICD-10-CM

## 2021-07-07 DIAGNOSIS — G4733 Obstructive sleep apnea (adult) (pediatric): Secondary | ICD-10-CM

## 2021-07-07 DIAGNOSIS — Z8249 Family history of ischemic heart disease and other diseases of the circulatory system: Secondary | ICD-10-CM

## 2021-07-07 DIAGNOSIS — I5032 Chronic diastolic (congestive) heart failure: Secondary | ICD-10-CM | POA: Diagnosis present

## 2021-07-07 DIAGNOSIS — E11319 Type 2 diabetes mellitus with unspecified diabetic retinopathy without macular edema: Secondary | ICD-10-CM | POA: Diagnosis present

## 2021-07-07 DIAGNOSIS — Z993 Dependence on wheelchair: Secondary | ICD-10-CM

## 2021-07-07 DIAGNOSIS — Z8614 Personal history of Methicillin resistant Staphylococcus aureus infection: Secondary | ICD-10-CM

## 2021-07-07 DIAGNOSIS — Z90710 Acquired absence of both cervix and uterus: Secondary | ICD-10-CM

## 2021-07-07 DIAGNOSIS — Z833 Family history of diabetes mellitus: Secondary | ICD-10-CM

## 2021-07-07 DIAGNOSIS — S82842A Displaced bimalleolar fracture of left lower leg, initial encounter for closed fracture: Secondary | ICD-10-CM | POA: Diagnosis present

## 2021-07-07 DIAGNOSIS — K7460 Unspecified cirrhosis of liver: Secondary | ICD-10-CM | POA: Diagnosis present

## 2021-07-07 DIAGNOSIS — J9611 Chronic respiratory failure with hypoxia: Secondary | ICD-10-CM | POA: Diagnosis present

## 2021-07-07 DIAGNOSIS — L03115 Cellulitis of right lower limb: Secondary | ICD-10-CM | POA: Diagnosis not present

## 2021-07-07 DIAGNOSIS — I69351 Hemiplegia and hemiparesis following cerebral infarction affecting right dominant side: Secondary | ICD-10-CM

## 2021-07-07 DIAGNOSIS — K3184 Gastroparesis: Secondary | ICD-10-CM | POA: Diagnosis present

## 2021-07-07 DIAGNOSIS — Z91013 Allergy to seafood: Secondary | ICD-10-CM

## 2021-07-07 DIAGNOSIS — Z9641 Presence of insulin pump (external) (internal): Secondary | ICD-10-CM | POA: Diagnosis present

## 2021-07-07 DIAGNOSIS — Z881 Allergy status to other antibiotic agents status: Secondary | ICD-10-CM

## 2021-07-07 DIAGNOSIS — S82832A Other fracture of upper and lower end of left fibula, initial encounter for closed fracture: Secondary | ICD-10-CM

## 2021-07-07 DIAGNOSIS — E890 Postprocedural hypothyroidism: Secondary | ICD-10-CM | POA: Diagnosis present

## 2021-07-07 DIAGNOSIS — E1122 Type 2 diabetes mellitus with diabetic chronic kidney disease: Secondary | ICD-10-CM | POA: Diagnosis present

## 2021-07-07 DIAGNOSIS — Z794 Long term (current) use of insulin: Secondary | ICD-10-CM

## 2021-07-07 DIAGNOSIS — I252 Old myocardial infarction: Secondary | ICD-10-CM

## 2021-07-07 DIAGNOSIS — B9562 Methicillin resistant Staphylococcus aureus infection as the cause of diseases classified elsewhere: Secondary | ICD-10-CM | POA: Diagnosis present

## 2021-07-07 DIAGNOSIS — Z885 Allergy status to narcotic agent status: Secondary | ICD-10-CM

## 2021-07-07 DIAGNOSIS — Z7951 Long term (current) use of inhaled steroids: Secondary | ICD-10-CM

## 2021-07-07 LAB — CBC WITH DIFFERENTIAL/PLATELET
Abs Immature Granulocytes: 0.01 10*3/uL (ref 0.00–0.07)
Basophils Absolute: 0.1 10*3/uL (ref 0.0–0.1)
Basophils Relative: 1 %
Eosinophils Absolute: 0.4 10*3/uL (ref 0.0–0.5)
Eosinophils Relative: 6 %
HCT: 32.7 % — ABNORMAL LOW (ref 36.0–46.0)
Hemoglobin: 10.7 g/dL — ABNORMAL LOW (ref 12.0–15.0)
Immature Granulocytes: 0 %
Lymphocytes Relative: 29 %
Lymphs Abs: 1.9 10*3/uL (ref 0.7–4.0)
MCH: 30 pg (ref 26.0–34.0)
MCHC: 32.7 g/dL (ref 30.0–36.0)
MCV: 91.6 fL (ref 80.0–100.0)
Monocytes Absolute: 0.4 10*3/uL (ref 0.1–1.0)
Monocytes Relative: 7 %
Neutro Abs: 3.7 10*3/uL (ref 1.7–7.7)
Neutrophils Relative %: 57 %
Platelets: 177 10*3/uL (ref 150–400)
RBC: 3.57 MIL/uL — ABNORMAL LOW (ref 3.87–5.11)
RDW: 13.8 % (ref 11.5–15.5)
WBC: 6.4 10*3/uL (ref 4.0–10.5)
nRBC: 0 % (ref 0.0–0.2)

## 2021-07-07 LAB — COMPREHENSIVE METABOLIC PANEL
ALT: 23 U/L (ref 0–44)
AST: 27 U/L (ref 15–41)
Albumin: 3.1 g/dL — ABNORMAL LOW (ref 3.5–5.0)
Alkaline Phosphatase: 78 U/L (ref 38–126)
Anion gap: 3 — ABNORMAL LOW (ref 5–15)
BUN: 35 mg/dL — ABNORMAL HIGH (ref 8–23)
CO2: 36 mmol/L — ABNORMAL HIGH (ref 22–32)
Calcium: 9.5 mg/dL (ref 8.9–10.3)
Chloride: 96 mmol/L — ABNORMAL LOW (ref 98–111)
Creatinine, Ser: 1.15 mg/dL — ABNORMAL HIGH (ref 0.44–1.00)
GFR, Estimated: 52 mL/min — ABNORMAL LOW (ref >60)
Glucose, Bld: 180 mg/dL — ABNORMAL HIGH (ref 70–99)
Potassium: 4.4 mmol/L (ref 3.5–5.1)
Sodium: 135 mmol/L (ref 135–145)
Total Bilirubin: 0.6 mg/dL (ref 0.3–1.2)
Total Protein: 6.5 g/dL (ref 6.5–8.1)

## 2021-07-07 LAB — BLOOD GAS, VENOUS
Acid-Base Excess: 11.4 mmol/L — ABNORMAL HIGH (ref 0.0–2.0)
Bicarbonate: 38 mmol/L — ABNORMAL HIGH (ref 20.0–28.0)
O2 Saturation: 86.7 %
Patient temperature: 37
pCO2, Ven: 60 mmHg (ref 44.0–60.0)
pH, Ven: 7.41 (ref 7.250–7.430)
pO2, Ven: 52 mmHg — ABNORMAL HIGH (ref 32.0–45.0)

## 2021-07-07 LAB — RESP PANEL BY RT-PCR (FLU A&B, COVID) ARPGX2
Influenza A by PCR: NEGATIVE
Influenza B by PCR: NEGATIVE
SARS Coronavirus 2 by RT PCR: NEGATIVE

## 2021-07-07 LAB — BRAIN NATRIURETIC PEPTIDE: B Natriuretic Peptide: 81.9 pg/mL (ref 0.0–100.0)

## 2021-07-07 MED ORDER — IPRATROPIUM-ALBUTEROL 0.5-2.5 (3) MG/3ML IN SOLN
3.0000 mL | Freq: Four times a day (QID) | RESPIRATORY_TRACT | Status: DC | PRN
  Administered 2021-07-08: 3 mL via RESPIRATORY_TRACT
  Filled 2021-07-07: qty 3

## 2021-07-07 MED ORDER — PANTOPRAZOLE SODIUM 40 MG PO TBEC
40.0000 mg | DELAYED_RELEASE_TABLET | Freq: Two times a day (BID) | ORAL | Status: DC
  Administered 2021-07-07 – 2021-07-13 (×12): 40 mg via ORAL
  Filled 2021-07-07 (×12): qty 1

## 2021-07-07 MED ORDER — ESCITALOPRAM OXALATE 10 MG PO TABS
10.0000 mg | ORAL_TABLET | Freq: Every day | ORAL | Status: DC
  Administered 2021-07-08 – 2021-07-13 (×6): 10 mg via ORAL
  Filled 2021-07-07 (×6): qty 1

## 2021-07-07 MED ORDER — ACETAMINOPHEN 650 MG RE SUPP: 650.0000 mg | Freq: Four times a day (QID) | RECTAL | Status: DC | PRN

## 2021-07-07 MED ORDER — GABAPENTIN 300 MG PO CAPS
600.0000 mg | ORAL_CAPSULE | Freq: Two times a day (BID) | ORAL | Status: DC
  Administered 2021-07-07 – 2021-07-09 (×4): 600 mg via ORAL
  Filled 2021-07-07 (×4): qty 2

## 2021-07-07 MED ORDER — ACETAMINOPHEN 325 MG PO TABS: 650.0000 mg | ORAL_TABLET | Freq: Four times a day (QID) | ORAL | Status: DC | PRN

## 2021-07-07 MED ORDER — ONDANSETRON HCL 4 MG/2ML IJ SOLN
4.0000 mg | Freq: Four times a day (QID) | INTRAMUSCULAR | Status: DC | PRN
  Administered 2021-07-09 – 2021-07-10 (×3): 4 mg via INTRAVENOUS
  Filled 2021-07-07 (×3): qty 2

## 2021-07-07 MED ORDER — SENNOSIDES-DOCUSATE SODIUM 8.6-50 MG PO TABS: 1.0000 | ORAL_TABLET | Freq: Every evening | ORAL | Status: DC | PRN

## 2021-07-07 MED ORDER — SULFAMETHOXAZOLE-TRIMETHOPRIM 800-160 MG PO TABS
1.0000 | ORAL_TABLET | Freq: Two times a day (BID) | ORAL | Status: AC
  Administered 2021-07-07 – 2021-07-08 (×2): 1 via ORAL
  Filled 2021-07-07 (×2): qty 1

## 2021-07-07 MED ORDER — IRBESARTAN 75 MG PO TABS
75.0000 mg | ORAL_TABLET | Freq: Every day | ORAL | Status: DC
  Filled 2021-07-07 (×2): qty 1

## 2021-07-07 MED ORDER — INSULIN PUMP
Freq: Three times a day (TID) | SUBCUTANEOUS | Status: DC
  Administered 2021-07-12: 4 via SUBCUTANEOUS
  Filled 2021-07-07: qty 1

## 2021-07-07 MED ORDER — OXYCODONE-ACETAMINOPHEN 5-325 MG PO TABS
1.0000 | ORAL_TABLET | Freq: Three times a day (TID) | ORAL | Status: DC | PRN
  Administered 2021-07-08: 2 via ORAL
  Filled 2021-07-07: qty 2

## 2021-07-07 MED ORDER — ALPRAZOLAM 0.5 MG PO TABS
0.5000 mg | ORAL_TABLET | Freq: Four times a day (QID) | ORAL | Status: AC | PRN
  Administered 2021-07-08 – 2021-07-10 (×2): 0.5 mg via ORAL
  Filled 2021-07-07 (×2): qty 1

## 2021-07-07 MED ORDER — ASPIRIN EC 81 MG PO TBEC
81.0000 mg | DELAYED_RELEASE_TABLET | Freq: Every day | ORAL | Status: DC
  Administered 2021-07-08 – 2021-07-13 (×6): 81 mg via ORAL
  Filled 2021-07-07 (×6): qty 1

## 2021-07-07 MED ORDER — ONDANSETRON HCL 4 MG PO TABS
4.0000 mg | ORAL_TABLET | Freq: Four times a day (QID) | ORAL | Status: DC | PRN
  Administered 2021-07-09 – 2021-07-11 (×3): 4 mg via ORAL
  Filled 2021-07-07 (×3): qty 1

## 2021-07-07 MED ORDER — LEVOTHYROXINE SODIUM 100 MCG PO TABS
200.0000 ug | ORAL_TABLET | Freq: Every day | ORAL | Status: DC
  Administered 2021-07-08 – 2021-07-13 (×6): 200 ug via ORAL
  Filled 2021-07-07 (×6): qty 2

## 2021-07-07 MED ORDER — HYDROCODONE-ACETAMINOPHEN 5-325 MG PO TABS
1.0000 | ORAL_TABLET | Freq: Once | ORAL | Status: AC
  Administered 2021-07-07: 1 via ORAL
  Filled 2021-07-07: qty 1

## 2021-07-07 MED ORDER — PRAMIPEXOLE DIHYDROCHLORIDE 1 MG PO TABS
1.0000 mg | ORAL_TABLET | Freq: Every day | ORAL | Status: DC
  Filled 2021-07-07: qty 1

## 2021-07-07 MED ORDER — ENOXAPARIN SODIUM 80 MG/0.8ML IJ SOSY
75.0000 mg | PREFILLED_SYRINGE | INTRAMUSCULAR | Status: DC
  Filled 2021-07-07: qty 0.75

## 2021-07-07 MED ORDER — ZOLPIDEM TARTRATE 5 MG PO TABS
5.0000 mg | ORAL_TABLET | Freq: Every day | ORAL | Status: DC
  Administered 2021-07-07 – 2021-07-12 (×6): 5 mg via ORAL
  Filled 2021-07-07 (×6): qty 1

## 2021-07-07 MED ORDER — TRAZODONE HCL 50 MG PO TABS: 150.0000 mg | ORAL_TABLET | Freq: Every day | ORAL | Status: DC

## 2021-07-07 MED ORDER — ALBUTEROL SULFATE (2.5 MG/3ML) 0.083% IN NEBU: 3.0000 mL | INHALATION_SOLUTION | Freq: Three times a day (TID) | RESPIRATORY_TRACT | Status: DC | PRN

## 2021-07-07 MED ORDER — FUROSEMIDE 10 MG/ML IJ SOLN
40.0000 mg | Freq: Two times a day (BID) | INTRAMUSCULAR | Status: DC
  Administered 2021-07-08: 40 mg via INTRAVENOUS
  Filled 2021-07-07: qty 4

## 2021-07-07 MED ORDER — IPRATROPIUM-ALBUTEROL 0.5-2.5 (3) MG/3ML IN SOLN
3.0000 mL | Freq: Once | RESPIRATORY_TRACT | Status: AC
  Administered 2021-07-07: 3 mL via RESPIRATORY_TRACT
  Filled 2021-07-07: qty 3

## 2021-07-07 MED ORDER — MAGNESIUM OXIDE -MG SUPPLEMENT 400 (240 MG) MG PO TABS
400.0000 mg | ORAL_TABLET | Freq: Every day | ORAL | Status: DC
  Administered 2021-07-08 – 2021-07-13 (×6): 400 mg via ORAL
  Filled 2021-07-07 (×6): qty 1

## 2021-07-07 MED ORDER — FUROSEMIDE 10 MG/ML IJ SOLN
60.0000 mg | Freq: Once | INTRAMUSCULAR | Status: AC
  Administered 2021-07-07: 60 mg via INTRAVENOUS
  Filled 2021-07-07: qty 8

## 2021-07-07 MED ORDER — METOLAZONE 5 MG PO TABS
5.0000 mg | ORAL_TABLET | ORAL | Status: DC
  Administered 2021-07-09 – 2021-07-13 (×2): 5 mg via ORAL
  Filled 2021-07-07 (×2): qty 1

## 2021-07-07 NOTE — ED Notes (Signed)
Pt to xray

## 2021-07-07 NOTE — H&P (Signed)
And does not History and Physical    Brittney [name] YKD:[id number] DOB: June 20, 1951 DOA: 07/07/2021  PCP: Rusty Aus, MD   Patient coming from: Home  I have personally briefly reviewed patient's old medical records in [place]  Chief Complaint: Fall with left ankle injury, lower extremity wounds, swelling  HPI: Brittney [name] is a 70 y.o. female with medical history significant for diabetes on an insulin pump, morbid obesity and wheelchair dependent, surgical hypothyroidism, HTN, chronic venous stasis wounds currently on Bactrim, COPD on home O2 at 3 L, OSA on CPAP, CAD, on chronic narcotics for osteoarthritic pain, history of falls presenting following a fall in which she sustained an injury to the left ankle.  She is also concerned about worsening swelling in her bilateral lower extremities and a 20 pound weight gain in spite of increases in her diuretic medication doses.  Patient denies chest pain.  Shortness of breath is at baseline.  Denies cough, fever or chills and denies abdominal pain or diarrhea.She follows with cardiology not have a CHF diagnosis.  Her last echo in August 2021 showed EF 55%.    ED course: On arrival, afebrile, BP 157/52 with pulse of 68, O2 sat 93% on O2 at home flow rate of 3 L.  Blood work with normal WBC, hemoglobin 10.7, creatinine 1.15 which is baseline.  VBG unremarkable.  Troponin and BNP not done  EKG not done  Imaging: Chest x-ray with mild CHF and left tib-fib x-ray showing distal fibular fracture  The ED provider treated patient with a dose of 60 mg Lasix, DuoNeb.  He spoke with the orthopedist, Dr. Rudene Christians, who according to EDP note recommended Ace wrap due to chronic wounds.  Hospitalist consulted for admission   Review of Systems: As per HPI otherwise all other systems on review of systems negative.    Past Medical History:  Diagnosis Date   Abdominal pain, left upper quadrant 12/11/2012   Anemia    Arthritis    Asthma    Broken leg  2014   fell twice and broke same leg. had a bone stimulator for first break.   Cancer ([place]) 2007   melanoma, left ear and back of left leg, removed from back   Chronic kidney disease, stage 2 (mild)    Chronic lower back pain    Collagen vascular disease (HCC)    Complication of anesthesia    slow to wake, coughing   Coronary artery disease    Diabetes mellitus without complication ([place])    Diabetic nephropathy associated with secondary diabetes mellitus ([place])    Flushing 12/11/2012   Gastroparesis    Gross hematuria 12/11/2012   Hepatic cirrhosis ([place])    History of kidney stones    Hyperlipidemia    Hypertension    Hypothyroidism    Hypothyroidism    IBS (irritable bowel syndrome)    Kidney stone 12/11/2012   Lower extremity edema    Morbid obesity with BMI of 40.0-44.9, adult (HCC)    Motion sickness    back seat of cars   MRSA (methicillin resistant staph aureus) culture positive 2016   Myocardial infarction ([place]) 09/2013   Nausea without vomiting 12/11/2012   Nephrolithiasis    Nephrolithiasis 04/26/2014   Nonproliferative retinopathy due to secondary diabetes (HCC)    Numbness and tingling of right leg    Peripheral vascular disease ([place])    Renal colic 39/76/7341   Renal insufficiency    Sciatica    Sciatica  12/11/2012   Skin cancer 08/2018   Sleep apnea    USES CPAP   Spinal cord stimulator status    battery in left buttock   Stroke ([place]) 07/2020   Unilateral small kidney without contralateral hypertrophy    Wears dentures    full lower   Wheelchair dependent    reports able to transfer with assist    Past Surgical History:  Procedure Laterality Date   ABDOMINAL HYSTERECTOMY     BACK SURGERY  04/1999   L-4-5 LAMINECTOMY. metal in lower back and neck   [place]     COLONOSCOPY  05/04/2001   COLONOSCOPY WITH PROPOFOL N/A 09/20/2016   Procedure: COLONOSCOPY WITH PROPOFOL;  Surgeon: Lollie Sails, MD;  Location: [facility]  ENDOSCOPY;  Service: Endoscopy;  Laterality: N/A;   DILATION AND CURETTAGE OF UTERUS     ESOPHAGOGASTRODUODENOSCOPY  02/08/2014   ESOPHAGOGASTRODUODENOSCOPY (EGD) WITH PROPOFOL N/A 09/20/2016   Procedure: ESOPHAGOGASTRODUODENOSCOPY (EGD) WITH PROPOFOL;  Surgeon: Lollie Sails, MD;  Location: [facility] ENDOSCOPY;  Service: Endoscopy;  Laterality: N/A;   ESOPHAGOGASTRODUODENOSCOPY (EGD) WITH PROPOFOL N/A 05/23/2018   Procedure: ESOPHAGOGASTRODUODENOSCOPY (EGD) WITH PROPOFOL;  Surgeon: Lollie Sails, MD;  Location: [facility] ENDOSCOPY;  Service: Endoscopy;  Laterality: N/A;   ESOPHAGOGASTRODUODENOSCOPY (EGD) WITH PROPOFOL N/A 07/18/2020   Procedure: ESOPHAGOGASTRODUODENOSCOPY (EGD) WITH PROPOFOL;  Surgeon: Lesly Rubenstein, MD;  Location: [facility] ENDOSCOPY;  Service: Gastroenterology;  Laterality: N/A;   EYE SURGERY  1986   FOR MELANOMA   HERNIA REPAIR     JOINT REPLACEMENT Left 09/24/2013   TOTAL KNEE   MOHS SURGERY  11/2016   left side of nose   PULSE GENERATOR IMPLANT N/A 11/02/2017   Procedure: UNILATERAL PULSE GENERATOR IMPLANT;  Surgeon: Meade Maw, MD;  Location: [facility] ORS;  Service: Neurosurgery;  Laterality: N/A;   TONSILLECTOMY     WITH ADENOIDECTOMY     reports that she has never smoked. She has never used smokeless tobacco. She reports that she does not drink alcohol and does not use drugs.  Allergies  Allergen Reactions   Shellfish Allergy Nausea And Vomiting, Diarrhea and Nausea Only    Non stop sickness once ingests shellfish. Betadine is okay   Cephalexin Other (See Comments)    Doesn't digest it well   Amlodipine Cough   Codeine Nausea And Vomiting and Nausea Only    states she can take codeine cough syrup without problems states she can take codeine cough syrup without problems   Imdur [Isosorbide Dinitrate] Other (See Comments) and Cough    Headache   Lyrica [Pregabalin] Swelling and Other (See Comments)    "FLU-LIKE" SYMPTOMS   Monosodium Glutamate Other (See  Comments) and Cough    Headache   Tape Other (See Comments) and Nausea And Vomiting    can use paper tape Redness    Family History  Problem Relation Age of Onset   Heart disease Mother    Cancer Mother    Asthma Mother    Diabetes Father    Kidney disease Father    Hypertension Father    Breast cancer Neg Hx       Prior to Admission medications   Medication Sig Start Date End Date Taking? Authorizing Provider  albuterol (VENTOLIN HFA) 108 (90 Base) MCG/ACT inhaler Inhale 2 puffs into the lungs 3 (three) times daily as needed for wheezing or shortness of breath.    Yes [provider]  ALPRAZolam Duanne Moron) 0.5 MG tablet Take  0.5 mg by mouth 4 (four) times daily. Take   Yes [provider]  aspirin EC 81 MG tablet Take 81 mg by mouth daily. Swallow whole.   Yes [provider]  budesonide (PULMICORT) 0.5 MG/2ML nebulizer solution Take 2 mLs by nebulization daily. 07/25/20  Yes [provider]  carvedilol (COREG) 25 MG tablet Take 25 mg by mouth 2 (two) times daily with a meal.   Yes [provider]  Cholecalciferol (VITAMIN D-3) 125 MCG (5000 UT) TABS Take by mouth daily at 8 pm.   Yes [provider]  cloNIDine (CATAPRES - DOSED IN MG/24 HR) 0.3 mg/24hr patch Place 0.3 mg onto the skin once a week.    Yes [provider]  cyclobenzaprine (FLEXERIL) 10 MG tablet Take 20 mg by mouth at bedtime. 12/19/19  Yes [provider]  diclofenac Sodium (VOLTAREN) 1 % GEL Apply topically 4 (four) times daily.   Yes [provider]  escitalopram (LEXAPRO) 10 MG tablet Take 10 mg by mouth daily.  12/13/12  Yes [provider]  fluticasone (FLONASE) 50 MCG/ACT nasal spray Place 2 sprays into both nostrils as needed.    Yes [provider]  furosemide (LASIX) 40 MG tablet Take 40 mg by mouth daily. 08/14/20  Yes [provider]  gabapentin (NEURONTIN) 600 MG tablet Take 1 tablet (600mg ) by mouth  twice daily and take 3 tablets (1800mg ) by mouth at bedtime Patient taking differently: 3,000 mg. Pt takes all 5 tablets at bedtime 08/21/20  Yes Wendee Beavers T, MD  hydrALAZINE (APRESOLINE) 50 MG tablet Take 50 mg by mouth in the morning and at bedtime.   Yes [provider]  ibuprofen (ADVIL,MOTRIN) 800 MG tablet Take 800 mg by mouth 3 (three) times daily as needed for mild pain or moderate pain.    Yes [provider]  insulin regular human CONCENTRATED (HUMULIN R) 500 UNIT/ML kwikpen Inject 40-80 Units into the skin See admin instructions. Uses insulin pump   Yes [provider]  ipratropium-albuterol (DUONEB) 0.5-2.5 (3) MG/3ML SOLN Take 3 mLs by nebulization every 6 (six) hours. 07/25/20  Yes [provider]  KLOR-CON M10 10 MEQ tablet Take 10 mEq by mouth daily. 06/09/21  Yes [provider]  levothyroxine (SYNTHROID) 200 MCG tablet Take 200 mcg by mouth daily before breakfast.   Yes [provider]  linaclotide (LINZESS) 145 MCG CAPS capsule Take 145 mcg by mouth daily.   Yes [provider]  magnesium oxide (MAG-OX) 400 MG tablet Take 400 mg by mouth daily.   Yes [provider]  metolazone (ZAROXOLYN) 5 MG tablet Take 5 mg by mouth 2 (two) times a week. 01/24/20 07/07/21 Yes [provider]  ondansetron (ZOFRAN) 4 MG tablet Take 4 mg by mouth every 12 (twelve) hours as needed for nausea or vomiting.  05/14/19  Yes [provider]  oxyCODONE-acetaminophen (PERCOCET) 10-325 MG tablet Take 1 tablet by mouth every 8 (eight) hours as needed for pain. Patient taking differently: Take 1 tablet by mouth daily at 8 pm. 06/14/21 08/13/21 Yes [name], MD  pantoprazole (PROTONIX) 40 MG tablet Take 40 mg by mouth 2 (two) times daily.    Yes [provider]  pramipexole (MIRAPEX) 0.5 MG tablet Take 1 mg by mouth at bedtime.   Yes [provider]  sulfamethoxazole-trimethoprim (BACTRIM DS) 800-160 MG  tablet Take 1 tablet by mouth 2 (two) times daily. 06/30/21  Yes [provider]  telmisartan (MICARDIS)  40 MG tablet Take 40 mg by mouth daily. 08/14/20  Yes [provider]  traMADol (ULTRAM) 50 MG tablet Take 100 mg by mouth at bedtime as needed.    Yes [provider]  traZODone (DESYREL) 150 MG tablet Take 150 mg by mouth at bedtime.  12/11/12  Yes [provider]  vitamin C (ASCORBIC ACID) 500 MG tablet Take 500 mg by mouth daily.   Yes [provider]  vitamin E 180 MG (400 UNITS) capsule Take 800 Units by mouth in the morning and at bedtime.   Yes [provider]  zolpidem (AMBIEN) 10 MG tablet Take 0.5 tablets (5 mg total) by mouth at bedtime. Patient taking differently: Take 10 mg by mouth at bedtime. 08/21/20  Yes Mercy Riding, MD  Insulin Human (INSULIN PUMP) SOLN Inject into the skin.    [provider]    Physical Exam: Vitals:   07/07/21 1536 07/07/21 1715 07/07/21 1800 07/07/21 1947  BP:  (!) 140/44 (!) 157/52 (!) 137/45  Pulse:  65 68 62  Resp:   16 16  Temp:      TempSrc:      SpO2:  92% 93% 93%  Weight: (!) 148.3 kg     Height: 5\' 6"  (1.676 m)        Vitals:   07/07/21 1536 07/07/21 1715 07/07/21 1800 07/07/21 1947  BP:  (!) 140/44 (!) 157/52 (!) 137/45  Pulse:  65 68 62  Resp:   16 16  Temp:      TempSrc:      SpO2:  92% 93% 93%  Weight: (!) 148.3 kg     Height: 5\' 6"  (1.676 m)         Constitutional: Alert and oriented x 3 . Not in any apparent distress HEENT:      Head: Normocephalic and atraumatic.         Eyes: PERLA, EOMI, Conjunctivae are normal. Sclera is non-icteric.       Mouth/Throat: Mucous membranes are moist.       Neck: Supple with no signs of meningismus. Cardiovascular: Regular rate and rhythm. No murmurs, gallops, or rubs. 2+ symmetrical distal pulses are present . No JVD.  3+ LE edema Respiratory: Respiratory effort slightly increased.Lungs sounds diminished bilaterally.   Faint bibasilar gastrointestinal: Soft, non tender, and non distended with positive bowel sounds.  Genitourinary: No CVA tenderness. Musculoskeletal: Left ankle in Ace wrap. No cyanosis, or erythema of extremities. Neurologic:  Face is symmetric. Moving all extremities. No gross focal neurologic deficits . Skin: Chronic venous stasis changes bilateral lower extremities Psychiatric: Mood and affect are normal    Labs on Admission: I have personally reviewed following labs and imaging studies  CBC: Recent Labs  Lab 07/07/21 1547  WBC 6.4  NEUTROABS 3.7  HGB 10.7*  HCT 32.7*  MCV 91.6  PLT 283   Basic Metabolic Panel: Recent Labs  Lab 07/07/21 1547  NA 135  K 4.4  CL 96*  CO2 36*  GLUCOSE 180*  BUN 35*  CREATININE 1.15*  CALCIUM 9.5   GFR: Estimated Creatinine Clearance: 70.1 mL/min (A) (by C-G formula based on SCr of 1.15 mg/dL (H)). Liver Function Tests: Recent Labs  Lab 07/07/21 1547  AST 27  ALT 23  ALKPHOS 78  BILITOT 0.6  PROT 6.5  ALBUMIN 3.1*   No results for input(s): LIPASE, AMYLASE in the last 168 hours. No results for input(s): AMMONIA in the last 168 hours. Coagulation Profile:  No results for input(s): INR, PROTIME in the last 168 hours. Cardiac Enzymes: No results for input(s): CKTOTAL, CKMB, CKMBINDEX, TROPONINI in the last 168 hours. BNP (last 3 results) No results for input(s): PROBNP in the last 8760 hours. HbA1C: No results for input(s): HGBA1C in the last 72 hours. CBG: No results for input(s): GLUCAP in the last 168 hours. Lipid Profile: No results for input(s): CHOL, HDL, LDLCALC, TRIG, CHOLHDL, LDLDIRECT in the last 72 hours. Thyroid Function Tests: No results for input(s): TSH, T4TOTAL, FREET4, T3FREE, THYROIDAB in the last 72 hours. Anemia Panel: No results for input(s): VITAMINB12, FOLATE, FERRITIN, TIBC, IRON, RETICCTPCT in the last 72 hours. Urine analysis:    Component Value Date/Time   COLORURINE YELLOW (A) 08/18/2020 0011    APPEARANCEUR HAZY (A) 08/18/2020 0011   APPEARANCEUR Clear 10/22/2014 1539   LABSPEC 1.015 08/18/2020 0011   LABSPEC 1.010 10/22/2014 1539   PHURINE 6.0 08/18/2020 0011   GLUCOSEU NEGATIVE 08/18/2020 0011   GLUCOSEU 150 mg/dL 10/22/2014 1539   HGBUR SMALL (A) 08/18/2020 0011   BILIRUBINUR NEGATIVE 08/18/2020 0011   BILIRUBINUR Negative 10/22/2014 1539   KETONESUR NEGATIVE 08/18/2020 0011   PROTEINUR 100 (A) 08/18/2020 0011   UROBILINOGEN 0.2 07/03/2009 1105   NITRITE NEGATIVE 08/18/2020 0011   LEUKOCYTESUR TRACE (A) 08/18/2020 0011   LEUKOCYTESUR Negative 10/22/2014 1539    Radiological Exams on Admission: DG Chest 2 View  Result Date: 07/07/2021 CLINICAL DATA:  Shortness of breath and history of recent fall EXAM: CHEST - 2 VIEW COMPARISON:  08/18/2020 FINDINGS: Cardiac shadow is enlarged. Vascular congestion is noted with mild interstitial edema new from the prior exam. Spinal stimulator is seen as well as postsurgical changes in the cervical spine. No acute bony abnormality is noted. No focal infiltrate is seen. IMPRESSION: Mild CHF. Electronically Signed   By: Inez Catalina M.D.   On: 07/07/2021 17:18   DG Tibia/Fibula Left  Result Date: 07/07/2021 CLINICAL DATA:  History of recent fall with left leg pain, initial encounter EXAM: LEFT TIBIA AND FIBULA - 2 VIEW COMPARISON:  None. FINDINGS: Left knee prosthesis is noted. There is an oblique fracture through the distal fibular metaphysis with only minimal displacement. Generalized soft tissue swelling is noted about the ankle. Mild cortical irregularity is noted in the distal tibial metaphysis. Undisplaced cortical fracture could not be totally excluded. Calcaneal spurring is noted. IMPRESSION: Undisplaced distal fibular fracture with associated soft tissue swelling. Findings suspicious for distal tibial fracture as described. Electronically Signed   By: Inez Catalina M.D.   On: 07/07/2021 17:20   DG Tibia/Fibula Right  Result Date:  07/07/2021 CLINICAL DATA:  Recent fall with right lower leg pain, initial encounter EXAM: RIGHT TIBIA AND FIBULA - 2 VIEW COMPARISON:  None. FINDINGS: Degenerative changes are noted about the knee joint. Calcaneal spurring is noted. No acute fracture or dislocation is seen. No gross soft tissue abnormality is noted. IMPRESSION: No acute fracture is seen. Electronically Signed   By: Inez Catalina M.D.   On: 07/07/2021 17:20   CT Head Wo Contrast  Result Date: 07/07/2021 CLINICAL DATA:  Head trauma, mental status changes of unknown cause. EXAM: CT HEAD WITHOUT CONTRAST TECHNIQUE: Contiguous axial images were obtained from the base of the skull through the vertex without intravenous contrast. COMPARISON:  Prior studies from August of 2021 in July of 2013 FINDINGS: Brain: No evidence of acute infarction, hemorrhage, hydrocephalus, extra-axial collection or mass lesion/mass effect. Signs of generalized atrophy as before. Areas of parenchymal calcification  also similar to prior imaging. Unchanged enlargement of LEFT sylvian fissure without mass effect. 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 Vascular: No hyperdense vessel or unexpected calcification. Skull: Normal. Negative for fracture or focal lesion. Sinuses/Orbits: Visualized paranasal sinuses and orbits are unremarkable. Other: None. IMPRESSION: 1. No acute intracranial pathology. 2. Signs of generalized atrophy and sylvian fissure enlargement as well as prior infarcts similar to the previous CT evaluation. Electronically Signed   By: Zetta Bills M.D.   On: 07/07/2021 19:44   DG Foot Complete Left  Result Date: 07/07/2021 CLINICAL DATA:  Recent fall with left foot pain, initial EXAM: LEFT FOOT - COMPLETE 3+ VIEW COMPARISON:  None. FINDINGS: Undisplaced distal fibular fracture is again seen with soft tissue swelling. Considerable soft tissue swelling is noted in the foot. No other fracture is seen. IMPRESSION: Soft tissue swelling. Distal  fibular fracture is again noted. Electronically Signed   By: Inez Catalina M.D.   On: 07/07/2021 17:23   DG Foot Complete Right  Result Date: 07/07/2021 CLINICAL DATA:  Recent fall with right foot pain, initial encounter EXAM: RIGHT FOOT COMPLETE - 3+ VIEW COMPARISON:  None. FINDINGS: Examination is somewhat limited due to overlying extrinsic artifact. Considerable soft tissue swelling is noted about the metatarsals dorsally. Calcaneal spurring is seen. Irregularity at the base of the first distal phalanx which may represent an undisplaced fracture. Correlate to point tenderness. No other focal abnormality is noted. Healed fibular fracture is noted distally. IMPRESSION: Question fracture of the first distal phalanx as described. Healed distal right fibular fracture Electronically Signed   By: Inez Catalina M.D.   On: 07/07/2021 17:22     Assessment/Plan Active Problems:   Venous stasis of both lower extremities   Fall at home, initial encounter   Diabetes mellitus with peripheral vascular disease ([place])   Essential hypertension   OSA on CPAP   CHF (congestive heart failure) ([place])   Chronic kidney disease, stage 3a ([place])   Left fibular fracture   70 year old female with history of  diabetes on an insulin pump BMI>50 and wheelchair dependent, surgical hypothyroidism, HTN, chronic venous stasis wounds currently on Bactrim, COPD on home O2 at 3 L, OSA on CPAP, CAD, on chronic narcotics for osteoarthritic pain, history of falls presenting following a fall in which she sustained an injury to the left ankle, as well as complaints of fluid retention and weight gain of 20 pounds.  Possible new onset CHF, subacute - Patient with chronic venous stasis presenting with increased lower extremity edema, 20 pound weight gain with chest x-ray showing mild CHF - We will get BNP and troponin - IV Lasix to replace home oral Lasix - Repeat echo.  Last echo with EF 55% - Continue home Coreg and telmisartan -Daily  weights, intake and output monitoring - Cardiology consult    Venous stasis ulcers of both lower extremities - Complete outpatient course of Bactrim t - Continue daily wound care - Keep legs elevated  Left distal fibula fracture secondary to fall at home, History of frequent falls Physical deconditioning /wheelchair dependent/BMI of 50 - Ace wrap to left ankle as recommended by Dr. Rudene Christians per ED provider, given venous stasis wounds - Patient had a syncope work-up following a fall in August 2021 with event monitor - Physical therapy evaluation -Avoid sedating psychotropics if possible  Polypharmacy - Patient is on multiple sedating and high fall risk meds including high-dose gabapentin, oxycodone, Flexeril, Xanax Ambien, trazodone - We will adjust as tolerated  Coronary artery disease -No complaints of chest pain. - Get EKG and troponin though low suspicion for ACS - Continue aspirin, carvedilol,.  Does not appear to be on a statin  COPD with chronic respiratory failure OSA -CPAP - Continue supplemental O2.  Home flow rate of 3 L     Essential hypertension -Continue telmisartan, Coreg, clonidine patch     Long term current use of opiate analgesic -Judicious opiate use in view of suspicion for contribution to fall     Type 2 diabetes mellitus On insulin pump -Patient has multiple complications including retinopathy, PAD, gastroparesis, neuropathy -Will allow patient to use her pump while inhouse  Surgical hypothyroidism - Continue levothyroxine 200 mg daily      DVT prophylaxis: Lovenox  Code Status: full code  Family Communication:  none  Disposition Plan: Back to previous home environment Consults called: Cardiology Status: Observation    Athena Masse MD [facility]     07/07/2021, 9:09 PM

## 2021-07-07 NOTE — ED Provider Notes (Signed)
[facility] Emergency Department Provider Note    Event Date/Time   First MD Initiated Contact with Patient 07/07/21 1530     (approximate)  I have reviewed the triage vital signs and the nursing notes.   HISTORY  Chief Complaint bilateral leg wounds    HPI Brittney [name] is a 70 y.o. female extensive past medical history as listed below presents to the ER for frequent falls as well as bilateral leg swelling pain concern for worsening infection as well as persistent shortness of breath.  States that she feels weak and that her legs are giving out on her.  Has not fallen and hit her head.  Denies any neck pain.  No chest pain or pressure.  States that her breathing feels slightly worse than previous.  She is currently on Bactrim for MRSA infection of the leg.  Past Medical History:  Diagnosis Date   Abdominal pain, left upper quadrant 12/11/2012   Anemia    Arthritis    Asthma    Broken leg 2014   fell twice and broke same leg. had a bone stimulator for first break.   Cancer ([place]) 2007   melanoma, left ear and back of left leg, removed from back   Chronic kidney disease, stage 2 (mild)    Chronic lower back pain    Collagen vascular disease (HCC)    Complication of anesthesia    slow to wake, coughing   Coronary artery disease    Diabetes mellitus without complication ([place])    Diabetic nephropathy associated with secondary diabetes mellitus ([place])    Flushing 12/11/2012   Gastroparesis    Gross hematuria 12/11/2012   Hepatic cirrhosis ([place])    History of kidney stones    Hyperlipidemia    Hypertension    Hypothyroidism    Hypothyroidism    IBS (irritable bowel syndrome)    Kidney stone 12/11/2012   Lower extremity edema    Morbid obesity with BMI of 40.0-44.9, adult (HCC)    Motion sickness    back seat of cars   MRSA (methicillin resistant staph aureus) culture positive 2016   Myocardial infarction ([place]) 09/2013   Nausea without  vomiting 12/11/2012   Nephrolithiasis    Nephrolithiasis 04/26/2014   Nonproliferative retinopathy due to secondary diabetes (HCC)    Numbness and tingling of right leg    Peripheral vascular disease ([place])    Renal colic 67/11/4579   Renal insufficiency    Sciatica    Sciatica 12/11/2012   Skin cancer 08/2018   Sleep apnea    USES CPAP   Spinal cord stimulator status    battery in left buttock   Stroke ([place]) 07/2020   Unilateral small kidney without contralateral hypertrophy    Wears dentures    full lower   Wheelchair dependent    reports able to transfer with assist   Family History  Problem Relation Age of Onset   Heart disease Mother    Cancer Mother    Asthma Mother    Diabetes Father    Kidney disease Father    Hypertension Father    Breast cancer Neg Hx    Past Surgical History:  Procedure Laterality Date   ABDOMINAL HYSTERECTOMY     BACK SURGERY  04/1999   L-4-5 LAMINECTOMY. metal in lower back and neck   CESAREAN SECTION  1980   CHOLECYSTECTOMY     COLONOSCOPY  05/04/2001   COLONOSCOPY WITH PROPOFOL N/A 09/20/2016  Procedure: COLONOSCOPY WITH PROPOFOL;  Surgeon: Lollie Sails, MD;  Location[facility] ENDOSCOPY;  Service: Endoscopy;  Laterality: N/A;   DILATION AND CURETTAGE OF UTERUS     ESOPHAGOGASTRODUODENOSCOPY  02/08/2014   ESOPHAGOGASTRODUODENOSCOPY (EGD) WITH PROPOFOL N/A 09/20/2016   Procedure: ESOPHAGOGASTRODUODENOSCOPY (EGD) WITH PROPOFOL;  Surgeon: Lollie Sails, MD;  Location: [facility] ENDOSCOPY;  Service: Endoscopy;  Laterality: N/A;   ESOPHAGOGASTRODUODENOSCOPY (EGD) WITH PROPOFOL N/A 05/23/2018   Procedure: ESOPHAGOGASTRODUODENOSCOPY (EGD) WITH PROPOFOL;  Surgeon: Lollie Sails, MD;  Location: [facility] ENDOSCOPY;  Service: Endoscopy;  Laterality: N/A;   ESOPHAGOGASTRODUODENOSCOPY (EGD) WITH PROPOFOL N/A 07/18/2020   Procedure: ESOPHAGOGASTRODUODENOSCOPY (EGD) WITH PROPOFOL;  Surgeon: Lesly Rubenstein, MD;  Location: [facility] ENDOSCOPY;  Service:  Gastroenterology;  Laterality: N/A;   EYE SURGERY  1986   FOR MELANOMA   HERNIA REPAIR     JOINT REPLACEMENT Left 09/24/2013   TOTAL KNEE   MOHS SURGERY  11/2016   left side of nose   PULSE GENERATOR IMPLANT N/A 11/02/2017   Procedure: UNILATERAL PULSE GENERATOR IMPLANT;  Surgeon: Meade Maw, MD;  Location: [facility] ORS;  Service: Neurosurgery;  Laterality: N/A;   TONSILLECTOMY     WITH ADENOIDECTOMY   Patient Active Problem List   Diagnosis Date Noted   Bilateral lower leg cellulitis 08/18/2020   Rhabdomyolysis 08/18/2020   Fall at home, initial encounter 08/18/2020   Fall 08/18/2020   Primary osteoarthritis of left knee 11/26/2019   Sepsis ([place]) 07/30/2019   Type 2 diabetes mellitus with diabetic polyneuropathy, with long-term current use of insulin ([place]) 07/03/2019   OSA on CPAP 01/24/2019   Lymphedema 09/03/2018   Venous stasis of both lower extremities 09/03/2018   Status post lumbar spine surgery for decompression of spinal cord 03/30/2018   Myofascial pain 03/30/2018   Spinal stenosis of thoracic region 03/30/2018   Back pain 11/02/2017   Iron deficiency anemia 05/10/2017   Benign essential hypertension 03/10/2017   Long term prescription benzodiazepine use 01/18/2017   Vitamin D insufficiency 01/18/2017   Neurogenic pain 01/17/2017   L3-4 severe lumbar facet hypertrophy and spinal stenosis 01/10/2017   Diabetes with retinopathy ([place]) 12/07/2016   Diabetic nephropathy ([place]) 12/07/2016   Gastroparesis due to DM ([place]) 12/07/2016   Long term current use of opiate analgesic 12/07/2016   Long term prescription opiate use 12/07/2016   Opiate use 12/07/2016   Chronic pain syndrome 12/07/2016   Chronic low back pain (Location of Primary Source of Pain) (Bilateral) (R>L) 12/07/2016   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/07/2016   Chronic lower extremity pain (Location of Secondary source of pain) (Bilateral) (R>L) 12/07/2016   Chronic knee pain (Location of  Tertiary source of pain) (Right) 12/07/2016   Osteoarthritis of knee (Right) 12/07/2016   Grade 1 Anterolisthesis of L3 over L4 and L4 over L5 12/07/2016   Osteoarthritis of sacroiliac joint (Right) 12/07/2016   Generalized osteoarthritis of multiple sites 10/22/2016   Degenerative spondylolisthesis 09/08/2016   L3-4 severe lumbar spinal stenosis (12/31/2016 MRI) 09/08/2016   Lung nodule, multiple 08/31/2016   Hepatic cirrhosis ([place]) 06/20/2016   Fibromyalgia 04/15/2016   Seronegative arthritis 04/15/2016   Diabetes mellitus with peripheral vascular disease ([place]) 03/16/2016   Type 2 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, with long-term current use of insulin ([place]) 03/16/2016   Morbid obesity with BMI of 50.0-59.9, adult ([place]) 03/16/2016   Type 2 diabetes mellitus with diabetic nephropathy, with long-term current use of insulin ([place]) 03/16/2016  Microalbuminuria 02/20/2016   Essential hypertension 01/27/2016   DM (diabetes mellitus) type II uncontrolled, periph vascular disorder ([place]) 04/26/2014   Hyperlipemia 04/26/2014   OSA (obstructive sleep apnea) 04/26/2014   Abnormal tumor markers 12/11/2012   Chronic kidney disease, stage II (mild) 12/11/2012   Unilateral small kidney 12/11/2012      Prior to Admission medications   Medication Sig Start Date End Date Taking? Authorizing Provider  albuterol (VENTOLIN HFA) 108 (90 Base) MCG/ACT inhaler Inhale 2 puffs into the lungs 3 (three) times daily as needed for wheezing or shortness of breath.    Yes [provider]  ALPRAZolam Duanne Moron) 0.5 MG tablet Take 0.5 mg by mouth 4 (four) times daily. Take   Yes [provider]  aspirin EC 81 MG tablet Take 81 mg by mouth daily. Swallow whole.   Yes [provider]  budesonide (PULMICORT) 0.5 MG/2ML nebulizer solution Take 2 mLs by nebulization daily. 07/25/20  Yes [provider]  carvedilol (COREG)  25 MG tablet Take 25 mg by mouth 2 (two) times daily with a meal.   Yes [provider]  Cholecalciferol (VITAMIN D-3) 125 MCG (5000 UT) TABS Take by mouth daily at 8 pm.   Yes [provider]  cloNIDine (CATAPRES - DOSED IN MG/24 HR) 0.3 mg/24hr patch Place 0.3 mg onto the skin once a week.    Yes [provider]  cyclobenzaprine (FLEXERIL) 10 MG tablet Take 20 mg by mouth at bedtime. 12/19/19  Yes [provider]  diclofenac Sodium (VOLTAREN) 1 % GEL Apply topically 4 (four) times daily.   Yes [provider]  escitalopram (LEXAPRO) 10 MG tablet Take 10 mg by mouth daily.  12/13/12  Yes [provider]  fluticasone (FLONASE) 50 MCG/ACT nasal spray Place 2 sprays into both nostrils as needed.    Yes [provider]  furosemide (LASIX) 40 MG tablet Take 40 mg by mouth daily. 08/14/20  Yes [provider]  gabapentin (NEURONTIN) 600 MG tablet Take 1 tablet (600mg ) by mouth twice daily and take 3 tablets (1800mg ) by mouth at bedtime Patient taking differently: 3,000 mg. Pt takes all 5 tablets at bedtime 08/21/20  Yes Wendee Beavers T, MD  hydrALAZINE (APRESOLINE) 50 MG tablet Take 50 mg by mouth in the morning and at bedtime.   Yes [provider]  ibuprofen (ADVIL,MOTRIN) 800 MG tablet Take 800 mg by mouth 3 (three) times daily as needed for mild pain or moderate pain.    Yes [provider]  insulin regular human CONCENTRATED (HUMULIN R) 500 UNIT/ML kwikpen Inject 40-80 Units into the skin See admin instructions. Uses insulin pump   Yes [provider]  ipratropium-albuterol (DUONEB) 0.5-2.5 (3) MG/3ML SOLN Take 3 mLs by nebulization every 6 (six) hours. 07/25/20  Yes [provider]  KLOR-CON M10 10 MEQ tablet Take 10 mEq by mouth daily. 06/09/21  Yes [provider]  levothyroxine (SYNTHROID) 200 MCG tablet Take 200 mcg by mouth daily before breakfast.   Yes [provider]   linaclotide (LINZESS) 145 MCG CAPS capsule Take 145 mcg by mouth daily.   Yes [provider]  magnesium oxide (MAG-OX) 400 MG tablet Take 400 mg by mouth daily.   Yes [provider]  metolazone (ZAROXOLYN) 5 MG tablet Take 5 mg by mouth 2 (two) times a week. 01/24/20 07/07/21 Yes [provider]  ondansetron (ZOFRAN) 4 MG tablet Take 4 mg by mouth every 12 (twelve) hours as needed for  nausea or vomiting.  05/14/19  Yes [provider]  oxyCODONE-acetaminophen (PERCOCET) 10-325 MG tablet Take 1 tablet by mouth every 8 (eight) hours as needed for pain. Patient taking differently: Take 1 tablet by mouth daily at 8 pm. 06/14/21 08/13/21 Yes [name], MD  pantoprazole (PROTONIX) 40 MG tablet Take 40 mg by mouth 2 (two) times daily.    Yes [provider]  pramipexole (MIRAPEX) 0.5 MG tablet Take 1 mg by mouth at bedtime.   Yes [provider]  sulfamethoxazole-trimethoprim (BACTRIM DS) 800-160 MG tablet Take 1 tablet by mouth 2 (two) times daily. 06/30/21  Yes [provider]  telmisartan (MICARDIS) 40 MG tablet Take 40 mg by mouth daily. 08/14/20  Yes [provider]  traMADol (ULTRAM) 50 MG tablet Take 100 mg by mouth at bedtime as needed.    Yes [provider]  traZODone (DESYREL) 150 MG tablet Take 150 mg by mouth at bedtime.  12/11/12  Yes [provider]  vitamin C (ASCORBIC ACID) 500 MG tablet Take 500 mg by mouth daily.   Yes [provider]  vitamin E 180 MG (400 UNITS) capsule Take 800 Units by mouth in the morning and at bedtime.   Yes [provider]  zolpidem (AMBIEN) 10 MG tablet Take 0.5 tablets (5 mg total) by mouth at bedtime. Patient taking differently: Take 10 mg by mouth at bedtime. 08/21/20  Yes Mercy Riding, MD  Insulin Human (INSULIN PUMP) SOLN Inject into the skin.    [provider]    Allergies Shellfish allergy, Cephalexin, Amlodipine, Codeine, Imdur  [isosorbide dinitrate], Lyrica [pregabalin], Monosodium glutamate, and Tape    Social History Social History   Tobacco Use   Smoking status: Never   Smokeless tobacco: Never  Vaping Use   Vaping Use: Never used  Substance Use Topics   Alcohol use: No   Drug use: Never    Review of Systems Patient denies headaches, rhinorrhea, blurry vision, numbness, shortness of breath, chest pain, edema, cough, abdominal pain, nausea, vomiting, diarrhea, dysuria, fevers, rashes or hallucinations unless otherwise stated above in HPI. ____________________________________________   PHYSICAL EXAM:  VITAL SIGNS: Vitals:   07/07/21 1800 07/07/21 1947  BP: (!) 157/52 (!) 137/45  Pulse: 68 62  Resp: 16 16  Temp:    SpO2: 93% 93%    Constitutional: Alert and oriented. Chronically ill appearing Eyes: Conjunctivae are normal.  Head: Atraumatic. Nose: No congestion/rhinnorhea. Mouth/Throat: Mucous membranes are moist.   Neck: No stridor. Painless ROM.  Cardiovascular: Normal rate, regular rhythm. Grossly normal heart sounds.  Good peripheral circulation. Respiratory: Normal respiratory effort.  No retractions. Lungs with inspiratory crackles throughout Gastrointestinal: Soft and nontender. No distention. No abdominal bruits. No CVA tenderness. Genitourinary: deferred Musculoskeletal: BLE 3+ pitting edema with venouse stasis ulceration and chronic ulceration to left heel.  5cm left anterior leg hematoma.  Ttp along left lateral leg, no crepitus.  No joint effusions. Neurologic:  Normal speech and language. No gross focal neurologic deficits are appreciated. No facial droop Skin:  Skin is warm, dry and intact. No rash noted. Psychiatric: Mood and affect are normal. Speech and behavior are normal.  ____________________________________________   LABS (all labs ordered are listed, but only abnormal results are displayed)  Results for orders placed or performed during the hospital encounter of  07/07/21 (from the past 24 hour(s))  Comprehensive metabolic panel     Status: Abnormal   Collection Time: 07/07/21  3:47 PM  Result Value Ref  Range   Sodium 135 135 - 145 mmol/L   Potassium 4.4 3.5 - 5.1 mmol/L   Chloride 96 (L) 98 - 111 mmol/L   CO2 36 (H) 22 - 32 mmol/L   Glucose, Bld 180 (H) 70 - 99 mg/dL   BUN 35 (H) 8 - 23 mg/dL   Creatinine, Ser 1.15 (H) 0.44 - 1.00 mg/dL   Calcium 9.5 8.9 - 10.3 mg/dL   Total Protein 6.5 6.5 - 8.1 g/dL   Albumin 3.1 (L) 3.5 - 5.0 g/dL   AST 27 15 - 41 U/L   ALT 23 0 - 44 U/L   Alkaline Phosphatase 78 38 - 126 U/L   Total Bilirubin 0.6 0.3 - 1.2 mg/dL   GFR, Estimated 52 (L) >60 mL/min   Anion gap 3 (L) 5 - 15  CBC with Differential     Status: Abnormal   Collection Time: 07/07/21  3:47 PM  Result Value Ref Range   WBC 6.4 4.0 - 10.5 K/uL   RBC 3.57 (L) 3.87 - 5.11 MIL/uL   Hemoglobin 10.7 (L) 12.0 - 15.0 g/dL   HCT 32.7 (L) 36.0 - 46.0 %   MCV 91.6 80.0 - 100.0 fL   MCH 30.0 26.0 - 34.0 pg   MCHC 32.7 30.0 - 36.0 g/dL   RDW 13.8 11.5 - 15.5 %   Platelets 177 150 - 400 K/uL   nRBC 0.0 0.0 - 0.2 %   Neutrophils Relative % 57 %   Neutro Abs 3.7 1.7 - 7.7 K/uL   Lymphocytes Relative 29 %   Lymphs Abs 1.9 0.7 - 4.0 K/uL   Monocytes Relative 7 %   Monocytes Absolute 0.4 0.1 - 1.0 K/uL   Eosinophils Relative 6 %   Eosinophils Absolute 0.4 0.0 - 0.5 K/uL   Basophils Relative 1 %   Basophils Absolute 0.1 0.0 - 0.1 K/uL   Immature Granulocytes 0 %   Abs Immature Granulocytes 0.01 0.00 - 0.07 K/uL  Blood gas, venous     Status: Abnormal   Collection Time: 07/07/21  3:57 PM  Result Value Ref Range   pH, Ven 7.41 7.250 - 7.430   pCO2, Ven 60 44.0 - 60.0 mmHg   pO2, Ven 52.0 (H) 32.0 - 45.0 mmHg   Bicarbonate 38.0 (H) 20.0 - 28.0 mmol/L   Acid-Base Excess 11.4 (H) 0.0 - 2.0 mmol/L   O2 Saturation 86.7 %   Patient temperature 37.0    Sample type VENOUS     ____________________________________________  ____________________________________________  RADIOLOGY  I personally reviewed all radiographic images ordered to evaluate for the above acute complaints and reviewed radiology reports and findings.  These findings were personally discussed with the patient.  Please see medical record for radiology report.  ____________________________________________   PROCEDURES  Procedure(s) performed:  Procedures    Critical Care performed: no ____________________________________________   INITIAL IMPRESSION / ASSESSMENT AND PLAN / ED COURSE  Pertinent labs & imaging results that were available during my care of the patient were reviewed by me and considered in my medical decision making (see chart for details).   DDX: cellulilitis, anasarca, edema, chf, fracture, contusion, hematoma  Brittney [name] is a 70 y.o. who presents to the ED with presentation as described above.  Patient chronically ill-appearing with fairly significant edema to bilateral lower extremities findings suggestive of mild CHF.  Not hypoxic on room air but will intermittently dipped down into the high 80s but then comes back up.  Suspect that she  is got some mild CHF.  X-rays to be ordered for above differential.  Clinical Course as of 07/07/21 2054  Tue Jul 07, 2021  1946 Further discussion with patient and son that she has been increasing her diuretic but having significant worsening swelling as well as orthopnea.  Has had 20 pound weight gain despite diuresis at home.  Given worsening swelling and presentation do feel patient may require hospitalization for aggressive diuresis and reassessment.  Did consult with Dr. Rudene Christians of orthopedics regarding her left leg fracture.  Plan will be placed in Ace wrap due to her chronic wound for reassessment.   [PR]    Clinical Course User Index [PR] Merlyn Lot, MD    The patient was evaluated in Emergency Department today for  the symptoms described in the history of present illness. He/she was evaluated in the context of the global COVID-19 pandemic, which necessitated consideration that the patient might be at risk for infection with the SARS-CoV-2 virus that causes COVID-19. Institutional protocols and algorithms that pertain to the evaluation of patients at risk for COVID-19 are in a state of rapid change based on information released by regulatory bodies including the CDC and federal and state organizations. These policies and algorithms were followed during the patient's care in the ED.  As part of my medical decision making, I reviewed the following data within the [place] notes reviewed and incorporated, Labs reviewed, notes from prior ED visits and [place] Controlled Substance Database   ____________________________________________   FINAL CLINICAL IMPRESSION(S) / ED DIAGNOSES  Final diagnoses:  Generalized edema  Acute on chronic congestive heart failure, unspecified heart failure type ([place])  Other closed fracture of distal end of left fibula, initial encounter      NEW MEDICATIONS STARTED DURING THIS VISIT:  New Prescriptions   No medications on file     Note:  This document was prepared using Dragon voice recognition software and may include unintentional dictation errors.    Merlyn Lot, MD 07/07/21 [phone]

## 2021-07-07 NOTE — ED Triage Notes (Addendum)
From home [facility] Bilateral leg wounds sees wound doc Had fall this morning and now increased pain  87% RA per EMS, 4L 98%CBG 274 T 99.4 oral Pt has insulin pump, DM, is obese  Pt states uses bedside commode at home, legs are weak and collapse on her, many recent falls, no LOC or head trauma  Pt states she only uses motorized wheelchair, does not walk but usually can stand and pivot  Pt also currently being treated for bronchitis  Currently 94% on RA

## 2021-07-08 ENCOUNTER — Observation Stay
Admit: 2021-07-08 | Discharge: 2021-07-08 | Disposition: A | Discharge status: Home / Self Care | Payer: Medicare Other | Attending: Internal Medicine | Admitting: Internal Medicine

## 2021-07-08 DIAGNOSIS — J449 Chronic obstructive pulmonary disease, unspecified: Secondary | ICD-10-CM | POA: Diagnosis present

## 2021-07-08 DIAGNOSIS — I5032 Chronic diastolic (congestive) heart failure: Secondary | ICD-10-CM | POA: Diagnosis present

## 2021-07-08 DIAGNOSIS — R296 Repeated falls: Secondary | ICD-10-CM | POA: Diagnosis present

## 2021-07-08 DIAGNOSIS — B9562 Methicillin resistant Staphylococcus aureus infection as the cause of diseases classified elsewhere: Secondary | ICD-10-CM | POA: Diagnosis present

## 2021-07-08 DIAGNOSIS — W19XXXA Unspecified fall, initial encounter: Secondary | ICD-10-CM | POA: Diagnosis present

## 2021-07-08 DIAGNOSIS — E1143 Type 2 diabetes mellitus with diabetic autonomic (poly)neuropathy: Secondary | ICD-10-CM | POA: Diagnosis present

## 2021-07-08 DIAGNOSIS — L03115 Cellulitis of right lower limb: Principal | ICD-10-CM

## 2021-07-08 DIAGNOSIS — S82832A Other fracture of upper and lower end of left fibula, initial encounter for closed fracture: Secondary | ICD-10-CM | POA: Diagnosis not present

## 2021-07-08 DIAGNOSIS — Z6841 Body Mass Index (BMI) 40.0 and over, adult: Secondary | ICD-10-CM | POA: Diagnosis not present

## 2021-07-08 DIAGNOSIS — L03116 Cellulitis of left lower limb: Secondary | ICD-10-CM | POA: Diagnosis present

## 2021-07-08 DIAGNOSIS — S82842A Displaced bimalleolar fracture of left lower leg, initial encounter for closed fracture: Secondary | ICD-10-CM | POA: Diagnosis present

## 2021-07-08 DIAGNOSIS — J9611 Chronic respiratory failure with hypoxia: Secondary | ICD-10-CM | POA: Diagnosis present

## 2021-07-08 DIAGNOSIS — G8929 Other chronic pain: Secondary | ICD-10-CM | POA: Diagnosis present

## 2021-07-08 DIAGNOSIS — E1122 Type 2 diabetes mellitus with diabetic chronic kidney disease: Secondary | ICD-10-CM | POA: Diagnosis present

## 2021-07-08 DIAGNOSIS — E1151 Type 2 diabetes mellitus with diabetic peripheral angiopathy without gangrene: Secondary | ICD-10-CM | POA: Diagnosis present

## 2021-07-08 DIAGNOSIS — I69351 Hemiplegia and hemiparesis following cerebral infarction affecting right dominant side: Secondary | ICD-10-CM | POA: Diagnosis not present

## 2021-07-08 DIAGNOSIS — I509 Heart failure, unspecified: Secondary | ICD-10-CM | POA: Diagnosis present

## 2021-07-08 DIAGNOSIS — K7460 Unspecified cirrhosis of liver: Secondary | ICD-10-CM | POA: Diagnosis present

## 2021-07-08 DIAGNOSIS — I2510 Atherosclerotic heart disease of native coronary artery without angina pectoris: Secondary | ICD-10-CM | POA: Diagnosis present

## 2021-07-08 DIAGNOSIS — E11319 Type 2 diabetes mellitus with unspecified diabetic retinopathy without macular edema: Secondary | ICD-10-CM | POA: Diagnosis present

## 2021-07-08 DIAGNOSIS — I130 Hypertensive heart and chronic kidney disease with heart failure and stage 1 through stage 4 chronic kidney disease, or unspecified chronic kidney disease: Secondary | ICD-10-CM | POA: Diagnosis present

## 2021-07-08 DIAGNOSIS — N1831 Chronic kidney disease, stage 3a: Secondary | ICD-10-CM | POA: Diagnosis present

## 2021-07-08 DIAGNOSIS — Z20822 Contact with and (suspected) exposure to covid-19: Secondary | ICD-10-CM | POA: Diagnosis present

## 2021-07-08 DIAGNOSIS — I83019 Varicose veins of right lower extremity with ulcer of unspecified site: Secondary | ICD-10-CM | POA: Diagnosis present

## 2021-07-08 DIAGNOSIS — K3184 Gastroparesis: Secondary | ICD-10-CM | POA: Diagnosis present

## 2021-07-08 DIAGNOSIS — E890 Postprocedural hypothyroidism: Secondary | ICD-10-CM | POA: Diagnosis present

## 2021-07-08 LAB — CBG MONITORING, ED
Glucose-Capillary: 72 mg/dL (ref 70–99)
Glucose-Capillary: 93 mg/dL (ref 70–99)
Glucose-Capillary: 123 mg/dL — ABNORMAL HIGH (ref 70–99)

## 2021-07-08 LAB — ECHOCARDIOGRAM COMPLETE
Height: 66 in
S' Lateral: 3.25 cm
Weight: 5232 oz

## 2021-07-08 MED ORDER — ENOXAPARIN SODIUM 80 MG/0.8ML IJ SOSY
75.0000 mg | PREFILLED_SYRINGE | INTRAMUSCULAR | Status: DC
  Administered 2021-07-08 – 2021-07-13 (×6): 75 mg via SUBCUTANEOUS
  Filled 2021-07-08 (×7): qty 0.75

## 2021-07-08 MED ORDER — TRAZODONE HCL 50 MG PO TABS
150.0000 mg | ORAL_TABLET | Freq: Every day | ORAL | Status: DC
  Administered 2021-07-08 – 2021-07-12 (×6): 150 mg via ORAL
  Filled 2021-07-08 (×6): qty 1

## 2021-07-08 MED ORDER — OXYCODONE-ACETAMINOPHEN 5-325 MG PO TABS
1.0000 | ORAL_TABLET | ORAL | Status: DC | PRN
  Administered 2021-07-08 – 2021-07-09 (×5): 2 via ORAL
  Administered 2021-07-09: 1 via ORAL
  Administered 2021-07-09 – 2021-07-11 (×7): 2 via ORAL
  Administered 2021-07-11 – 2021-07-12 (×2): 1 via ORAL
  Administered 2021-07-13: 2 via ORAL
  Filled 2021-07-08: qty 2
  Filled 2021-07-08: qty 1
  Filled 2021-07-08 (×6): qty 2
  Filled 2021-07-08: qty 1
  Filled 2021-07-08: qty 2
  Filled 2021-07-08 (×5): qty 1
  Filled 2021-07-08 (×3): qty 2

## 2021-07-08 MED ORDER — IRBESARTAN 150 MG PO TABS
75.0000 mg | ORAL_TABLET | Freq: Every day | ORAL | Status: DC
  Administered 2021-07-08 – 2021-07-13 (×6): 75 mg via ORAL
  Filled 2021-07-08 (×7): qty 1

## 2021-07-08 MED ORDER — AMOXICILLIN-POT CLAVULANATE 875-125 MG PO TABS
1.0000 | ORAL_TABLET | Freq: Two times a day (BID) | ORAL | Status: DC
  Administered 2021-07-08 – 2021-07-12 (×9): 1 via ORAL
  Filled 2021-07-08 (×9): qty 1

## 2021-07-08 MED ORDER — PRAMIPEXOLE DIHYDROCHLORIDE 0.25 MG PO TABS
1.0000 mg | ORAL_TABLET | Freq: Every day | ORAL | Status: DC
  Administered 2021-07-08 – 2021-07-12 (×6): 1 mg via ORAL
  Filled 2021-07-08 (×2): qty 1
  Filled 2021-07-08 (×5): qty 4

## 2021-07-08 MED ORDER — DOXYCYCLINE HYCLATE 100 MG PO TABS
100.0000 mg | ORAL_TABLET | Freq: Two times a day (BID) | ORAL | Status: DC
  Administered 2021-07-08 – 2021-07-12 (×9): 100 mg via ORAL
  Filled 2021-07-08 (×9): qty 1

## 2021-07-08 NOTE — Progress Notes (Signed)
PROGRESS NOTE    Brittney [name]  IOX:[id number] DOB: 01/21/1951 DOA: 07/07/2021 PCP: Rusty Aus, MD   Chief complaint.  Bilateral leg pain, fall Brief Narrative:  Brittney [name] is a 70 y.o. female with medical history significant for diabetes on an insulin pump, morbid obesity and wheelchair dependent, surgical hypothyroidism, HTN, chronic venous stasis wounds currently on Bactrim, COPD on home O2 at 3 L, OSA on CPAP, CAD, on chronic narcotics for osteoarthritic pain, history of falls presenting following a fall in which she sustained an injury to the left ankle.  She is also concerned about worsening swelling in her bilateral lower extremities and a 20 pound weight gain in spite of increases in her diuretic medication doses. Patient was giving IV Lasix offered admission to the hospital, her BNP was normal. X-ray showed left ankle fracture, she also has right leg venous stasis ulcer, with the redness, warmth and tender to touch.  She is started on oral antibiotics with doxycycline and Augmentin for cellulitis   Assessment & Plan:   Active Problems:   Diabetes mellitus with peripheral vascular disease ([place])   Essential hypertension   Venous stasis of both lower extremities   OSA on CPAP   Fall at home, initial encounter   CHF (congestive heart failure) (HCC)   Chronic kidney disease, stage 3a (HCC)   Left fibular fracture   Left leg cellulitis  #1.  Bilateral lower extremity venous stasis with stasis ulcers. Left ankle fracture from fall. Fall at home. Right lower extremity cellulitis. Based on my examination, patient seemed to have a mild cellulitis of the right lower extremity.  She also has venous stasis ulcer.  Will obtain wound care.  I will also start oral antibiotics with Augmentin and doxycycline. Patient has been seen by orthopedics, pending decision about surgery.  #2.  Morbid obesity. Chronic hypoxemic respiratory failure. Obstructive sleep apnea. COPD Patient  does not appear to have acute on chronic congestive heart failure, she has chronic venous stasis with mild cellulitis. I will discontinue IV Lasix. Continue oxygen treatment at home medicines.       DVT prophylaxis: Lovenox Code Status: Full Family Communication:  Disposition Plan:    Status is: Inpatient  Remains inpatient appropriate because:Inpatient level of care appropriate due to severity of illness  Dispo: The patient is from: Home              Anticipated d/c is to: Home              Patient currently is not medically stable to d/c.   Difficult to place patient No        No intake/output data recorded. No intake/output data recorded.     Consultants:  Orthopedics  Procedures: None  Antimicrobials: Augmentin and doxycycline.  Subjective: Patient complaining of bilateral lower extremity pain.  Worse on left side.  Leg edema. On 3 L oxygen, short of breath at baseline.  No cough. No fever chills  No abdominal pain or nausea vomiting. No dysuria hematuria Objective: Vitals:   07/08/21 0500 07/08/21 0540 07/08/21 0700 07/08/21 0800  BP: (!) 133/49 131/63 127/77 (!) 132/57  Pulse: 74 75 76 72  Resp:  (!) 24 16 19   Temp:      TempSrc:      SpO2: 96% 97% 97% 93%  Weight:      Height:       No intake or output data in the 24 hours ending 07/08/21 [place]  07/07/21 1536  Weight: (!) 148.3 kg    Examination:  General exam: Morbidly obese. Respiratory system: Clear to auscultation. Respiratory effort normal. Cardiovascular system: S1 & S2 heard, RRR. No JVD, murmurs, rubs, gallops or clicks. No pedal edema. Gastrointestinal system: Abdomen is nondistended, soft and nontender. No organomegaly or masses felt. Normal bowel sounds heard. Central nervous system: Alert and oriented. No focal neurological deficits. Extremities: Bilateral lower extremity venous stasis.  Left lower extremity hematoma.  Right lower extremity with venous stasis  ulcer, red, warm, tender to touch. Skin: No rashes, lesions or ulcers Psychiatry: Judgement and insight appear normal. Mood & affect appropriate.     Data Reviewed: I have personally reviewed following labs and imaging studies  CBC: Recent Labs  Lab 07/07/21 1547  WBC 6.4  NEUTROABS 3.7  HGB 10.7*  HCT 32.7*  MCV 91.6  PLT 751   Basic Metabolic Panel: Recent Labs  Lab 07/07/21 1547  NA 135  K 4.4  CL 96*  CO2 36*  GLUCOSE 180*  BUN 35*  CREATININE 1.15*  CALCIUM 9.5   GFR: Estimated Creatinine Clearance: 70.1 mL/min (A) (by C-G formula based on SCr of 1.15 mg/dL (H)). Liver Function Tests: Recent Labs  Lab 07/07/21 1547  AST 27  ALT 23  ALKPHOS 78  BILITOT 0.6  PROT 6.5  ALBUMIN 3.1*   No results for input(s): LIPASE, AMYLASE in the last 168 hours. No results for input(s): AMMONIA in the last 168 hours. Coagulation Profile: No results for input(s): INR, PROTIME in the last 168 hours. Cardiac Enzymes: No results for input(s): CKTOTAL, CKMB, CKMBINDEX, TROPONINI in the last 168 hours. BNP (last 3 results) No results for input(s): PROBNP in the last 8760 hours. HbA1C: No results for input(s): HGBA1C in the last 72 hours. CBG: Recent Labs  Lab 07/08/21 0542 07/08/21 0801  GLUCAP 72 93   Lipid Profile: No results for input(s): CHOL, HDL, LDLCALC, TRIG, CHOLHDL, LDLDIRECT in the last 72 hours. Thyroid Function Tests: No results for input(s): TSH, T4TOTAL, FREET4, T3FREE, THYROIDAB in the last 72 hours. Anemia Panel: No results for input(s): VITAMINB12, FOLATE, FERRITIN, TIBC, IRON, RETICCTPCT in the last 72 hours. Sepsis Labs: No results for input(s): PROCALCITON, LATICACIDVEN in the last 168 hours.  Recent Results (from the past 240 hour(s))  Aerobic Culture w Gram Stain (superficial specimen)     Status: None   Collection Time: 06/30/21  4:18 PM   Specimen: Leg  Result Value Ref Range Status   Specimen Description   Final    LEG RIGHT LOWER  LEG Performed at [facility], [address], [place], [place] [address]    Special Requests   Final    NONE Performed at [facility], [address] [place], [place] [address]    Gram Stain   Final    RARE WBC PRESENT,BOTH PMN AND MONONUCLEAR MODERATE GRAM POSITIVE COCCI    Culture   Final    ABUNDANT METHICILLIN RESISTANT STAPHYLOCOCCUS AUREUS WITHIN MIXED ORGANISMS Performed at [facility], [place] [address], [place], [place] [address]    Report Status 07/04/2021 FINAL  Final   Organism ID, Bacteria METHICILLIN RESISTANT STAPHYLOCOCCUS AUREUS  Final      Susceptibility   Methicillin resistant staphylococcus aureus - MIC*    CIPROFLOXACIN >=8 RESISTANT Resistant     ERYTHROMYCIN >=8 RESISTANT Resistant     GENTAMICIN <=0.5 SENSITIVE Sensitive     OXACILLIN >=4 RESISTANT Resistant     TETRACYCLINE <=1  SENSITIVE Sensitive     VANCOMYCIN 1 SENSITIVE Sensitive     TRIMETH/SULFA <=10 SENSITIVE Sensitive     CLINDAMYCIN >=8 RESISTANT Resistant     RIFAMPIN <=0.5 SENSITIVE Sensitive     Inducible Clindamycin NEGATIVE Sensitive     * ABUNDANT METHICILLIN RESISTANT STAPHYLOCOCCUS AUREUS  Resp Panel by RT-PCR (Flu A&B, Covid) Nasopharyngeal Swab     Status: None   Collection Time: 07/07/21  8:00 PM   Specimen: Nasopharyngeal Swab; Nasopharyngeal(NP) swabs in vial transport medium  Result Value Ref Range Status   SARS Coronavirus 2 by RT PCR NEGATIVE NEGATIVE Final    Comment: (NOTE) SARS-CoV-2 target nucleic acids are NOT DETECTED.  The SARS-CoV-2 RNA is generally detectable in upper respiratory specimens during the acute phase of infection. The lowest concentration of SARS-CoV-2 viral copies this assay can detect is 138 copies/mL. A negative result does not preclude SARS-Cov-2 infection and should not be used as the sole basis for treatment or other patient management decisions. A negative result may occur with  improper specimen collection/handling,  submission of specimen other than nasopharyngeal swab, presence of viral mutation(s) within the areas targeted by this assay, and inadequate number of viral copies(<138 copies/mL). A negative result must be combined with clinical observations, patient history, and epidemiological information. The expected result is Negative.  Fact Sheet for Patients:  EntrepreneurPulse.com.au  Fact Sheet for Healthcare Providers:  IncredibleEmployment.be  This test is no t yet approved or cleared by the [place] FDA and  has been authorized for detection and/or diagnosis of SARS-CoV-2 by FDA under an Emergency Use Authorization (EUA). This EUA will remain  in effect (meaning this test can be used) for the duration of the COVID-19 declaration under Section 564(b)(1) of the Act, 21 U.S.C.section 360bbb-3(b)(1), unless the authorization is terminated  or revoked sooner.       Influenza A by PCR NEGATIVE NEGATIVE Final   Influenza B by PCR NEGATIVE NEGATIVE Final    Comment: (NOTE) The Xpert Xpress SARS-CoV-2/FLU/RSV plus assay is intended as an aid in the diagnosis of influenza from Nasopharyngeal swab specimens and should not be used as a sole basis for treatment. Nasal washings and aspirates are unacceptable for Xpert Xpress SARS-CoV-2/FLU/RSV testing.  Fact Sheet for Patients: EntrepreneurPulse.com.au  Fact Sheet for Healthcare Providers: IncredibleEmployment.be  This test is not yet approved or cleared by the [place] FDA and has been authorized for detection and/or diagnosis of SARS-CoV-2 by FDA under an Emergency Use Authorization (EUA). This EUA will remain in effect (meaning this test can be used) for the duration of the COVID-19 declaration under Section 564(b)(1) of the Act, 21 U.S.C. section 360bbb-3(b)(1), unless the authorization is terminated or revoked.  Performed at [facility], [address], [place], [place] [address]          Radiology Studies: DG Chest 2 View  Result Date: 07/07/2021 CLINICAL DATA:  Shortness of breath and history of recent fall EXAM: CHEST - 2 VIEW COMPARISON:  08/18/2020 FINDINGS: Cardiac shadow is enlarged. Vascular congestion is noted with mild interstitial edema new from the prior exam. Spinal stimulator is seen as well as postsurgical changes in the cervical spine. No acute bony abnormality is noted. No focal infiltrate is seen. IMPRESSION: Mild CHF. Electronically Signed   By: Inez Catalina M.D.   On: 07/07/2021 17:18   DG Tibia/Fibula Left  Result Date: 07/07/2021 CLINICAL DATA:  History of recent fall with left leg pain, initial encounter EXAM: LEFT TIBIA AND  FIBULA - 2 VIEW COMPARISON:  None. FINDINGS: Left knee prosthesis is noted. There is an oblique fracture through the distal fibular metaphysis with only minimal displacement. Generalized soft tissue swelling is noted about the ankle. Mild cortical irregularity is noted in the distal tibial metaphysis. Undisplaced cortical fracture could not be totally excluded. Calcaneal spurring is noted. IMPRESSION: Undisplaced distal fibular fracture with associated soft tissue swelling. Findings suspicious for distal tibial fracture as described. Electronically Signed   By: Inez Catalina M.D.   On: 07/07/2021 17:20   DG Tibia/Fibula Right  Result Date: 07/07/2021 CLINICAL DATA:  Recent fall with right lower leg pain, initial encounter EXAM: RIGHT TIBIA AND FIBULA - 2 VIEW COMPARISON:  None. FINDINGS: Degenerative changes are noted about the knee joint. Calcaneal spurring is noted. No acute fracture or dislocation is seen. No gross soft tissue abnormality is noted. IMPRESSION: No acute fracture is seen. Electronically Signed   By: Inez Catalina M.D.   On: 07/07/2021 17:20   CT Head Wo Contrast  Result Date: 07/07/2021 CLINICAL DATA:  Head trauma, mental status changes of unknown cause. EXAM: CT HEAD  WITHOUT CONTRAST TECHNIQUE: Contiguous axial images were obtained from the base of the skull through the vertex without intravenous contrast. COMPARISON:  Prior studies from August of 2021 in July of 2013 FINDINGS: Brain: No evidence of acute infarction, hemorrhage, hydrocephalus, extra-axial collection or mass lesion/mass effect. Signs of generalized atrophy as before. Areas of parenchymal calcification also similar to prior imaging. Unchanged enlargement of LEFT sylvian fissure without mass effect. 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 Vascular: No hyperdense vessel or unexpected calcification. Skull: Normal. Negative for fracture or focal lesion. Sinuses/Orbits: Visualized paranasal sinuses and orbits are unremarkable. Other: None. IMPRESSION: 1. No acute intracranial pathology. 2. Signs of generalized atrophy and sylvian fissure enlargement as well as prior infarcts similar to the previous CT evaluation. Electronically Signed   By: Zetta Bills M.D.   On: 07/07/2021 19:44   DG Foot Complete Left  Result Date: 07/07/2021 CLINICAL DATA:  Recent fall with left foot pain, initial EXAM: LEFT FOOT - COMPLETE 3+ VIEW COMPARISON:  None. FINDINGS: Undisplaced distal fibular fracture is again seen with soft tissue swelling. Considerable soft tissue swelling is noted in the foot. No other fracture is seen. IMPRESSION: Soft tissue swelling. Distal fibular fracture is again noted. Electronically Signed   By: Inez Catalina M.D.   On: 07/07/2021 17:23   DG Foot Complete Right  Result Date: 07/07/2021 CLINICAL DATA:  Recent fall with right foot pain, initial encounter EXAM: RIGHT FOOT COMPLETE - 3+ VIEW COMPARISON:  None. FINDINGS: Examination is somewhat limited due to overlying extrinsic artifact. Considerable soft tissue swelling is noted about the metatarsals dorsally. Calcaneal spurring is seen. Irregularity at the base of the first distal phalanx which may represent an undisplaced fracture.  Correlate to point tenderness. No other focal abnormality is noted. Healed fibular fracture is noted distally. IMPRESSION: Question fracture of the first distal phalanx as described. Healed distal right fibular fracture Electronically Signed   By: Inez Catalina M.D.   On: 07/07/2021 17:22        Scheduled Meds:  amoxicillin-clavulanate  1 tablet Oral Q12H   aspirin EC  81 mg Oral Daily   doxycycline  100 mg Oral Q12H   enoxaparin (LOVENOX) injection  75 mg Subcutaneous Q24H   escitalopram  10 mg Oral Daily   gabapentin  600 mg Oral BID   insulin pump   Subcutaneous  TID WC, HS, 0200   irbesartan  75 mg Oral Daily   levothyroxine  200 mcg Oral QAC breakfast   magnesium oxide  400 mg Oral Daily   [START ON 07/09/2021] metolazone  5 mg Oral Once per day on Mon Thu   pantoprazole  40 mg Oral BID   pramipexole  1 mg Oral QHS   traZODone  150 mg Oral QHS   zolpidem  5 mg Oral QHS   Continuous Infusions:   LOS: 0 days    Time spent: 32 minutes    Sharen Hones, MD [facility]   To contact the attending provider between 7A-7P or the covering provider during after hours 7P-7A, please log into the web site [email or web address] and access using universal [place] password for that web site. If you do not have the password, please call the hospital operator.  07/08/2021, 12:19 PM

## 2021-07-08 NOTE — Progress Notes (Signed)
*  PRELIMINARY RESULTS* Echocardiogram 2D Echocardiogram has been performed.  Brittney [name] 07/08/2021, 11:58 AM

## 2021-07-08 NOTE — Consult Note (Signed)
Cardiology Consultation Note    Patient ID: Brittney [name], MRN: [id number], DOB/AGE: 02-24-1951 70 y.o. Admit date: 07/07/2021   Date of Consult: 07/08/2021 Primary Physician: Rusty Aus, MD Primary Cardiologist: Dr. Clayborn Bigness  Chief Complaint: ankle pain Reason for Consultation: chf on cxr Requesting MD: Dr. Roosevelt Locks  HPI: Brittney [name] is a 70 y.o. female with history of diabetes on an insulin pump, morbid obesity and wheelchair dependent, surgical hypothyroidism, HTN, chronic venous stasis wounds, COPD on home O2 at 3 L, OSA on CPAP, CAD, on chronic narcotics for osteoarthritic pain, history of falls presenting following a fall in which she sustained an injury to the left ankle.  She is also concerned about worsening swelling in her bilateral lower extremities and a 20 pound weight gain in spite of increases in her diuretic medication doses.  Patient denies chest pain.  Shortness of breath is at baseline.  Denies cough, fever or chills and denies abdominal pain or diarrhea. She follows with cardiology not have a CHF diagnosis.  Her last echo in August 2021 showed EF 55%.  CXR showed mild chf.   Past Medical History:  Diagnosis Date   Abdominal pain, left upper quadrant 12/11/2012   Anemia    Arthritis    Asthma    Broken leg 2014   fell twice and broke same leg. had a bone stimulator for first break.   Cancer ([place]) 2007   melanoma, left ear and back of left leg, removed from back   Chronic kidney disease, stage 2 (mild)    Chronic lower back pain    Collagen vascular disease (HCC)    Complication of anesthesia    slow to wake, coughing   Coronary artery disease    Diabetes mellitus without complication ([place])    Diabetic nephropathy associated with secondary diabetes mellitus ([place])    Flushing 12/11/2012   Gastroparesis    Gross hematuria 12/11/2012   Hepatic cirrhosis ([place])    History of kidney stones    Hyperlipidemia    Hypertension    Hypothyroidism     Hypothyroidism    IBS (irritable bowel syndrome)    Kidney stone 12/11/2012   Lower extremity edema    Morbid obesity with BMI of 40.0-44.9, adult (HCC)    Motion sickness    back seat of cars   MRSA (methicillin resistant staph aureus) culture positive 2016   Myocardial infarction ([place]) 09/2013   Nausea without vomiting 12/11/2012   Nephrolithiasis    Nephrolithiasis 04/26/2014   Nonproliferative retinopathy due to secondary diabetes (HCC)    Numbness and tingling of right leg    Peripheral vascular disease ([place])    Renal colic 16/60/6301   Renal insufficiency    Sciatica    Sciatica 12/11/2012   Skin cancer 08/2018   Sleep apnea    USES CPAP   Spinal cord stimulator status    battery in left buttock   Stroke ([place]) 07/2020   Unilateral small kidney without contralateral hypertrophy    Wears dentures    full lower   Wheelchair dependent    reports able to transfer with assist      Surgical History:  Past Surgical History:  Procedure Laterality Date   ABDOMINAL HYSTERECTOMY     BACK SURGERY  04/1999   L-4-5 LAMINECTOMY. metal in lower back and neck   [place]     COLONOSCOPY  05/04/2001   COLONOSCOPY WITH PROPOFOL N/A 09/20/2016  Procedure: COLONOSCOPY WITH PROPOFOL;  Surgeon: Lollie Sails, MD;  Location: [facility] ENDOSCOPY;  Service: Endoscopy;  Laterality: N/A;   DILATION AND CURETTAGE OF UTERUS     ESOPHAGOGASTRODUODENOSCOPY  02/08/2014   ESOPHAGOGASTRODUODENOSCOPY (EGD) WITH PROPOFOL N/A 09/20/2016   Procedure: ESOPHAGOGASTRODUODENOSCOPY (EGD) WITH PROPOFOL;  Surgeon: Lollie Sails, MD;  Location: [facility] ENDOSCOPY;  Service: Endoscopy;  Laterality: N/A;   ESOPHAGOGASTRODUODENOSCOPY (EGD) WITH PROPOFOL N/A 05/23/2018   Procedure: ESOPHAGOGASTRODUODENOSCOPY (EGD) WITH PROPOFOL;  Surgeon: Lollie Sails, MD;  Location: [facility] ENDOSCOPY;  Service: Endoscopy;  Laterality: N/A;   ESOPHAGOGASTRODUODENOSCOPY (EGD) WITH PROPOFOL N/A  07/18/2020   Procedure: ESOPHAGOGASTRODUODENOSCOPY (EGD) WITH PROPOFOL;  Surgeon: Lesly Rubenstein, MD;  Location: [facility] ENDOSCOPY;  Service: Gastroenterology;  Laterality: N/A;   EYE SURGERY  1986   FOR MELANOMA   HERNIA REPAIR     JOINT REPLACEMENT Left 09/24/2013   TOTAL KNEE   MOHS SURGERY  11/2016   left side of nose   PULSE GENERATOR IMPLANT N/A 11/02/2017   Procedure: UNILATERAL PULSE GENERATOR IMPLANT;  Surgeon: Meade Maw, MD;  Location: [facility] ORS;  Service: Neurosurgery;  Laterality: N/A;   TONSILLECTOMY     WITH ADENOIDECTOMY     Home Meds: Prior to Admission medications   Medication Sig Start Date End Date Taking? Authorizing Provider  albuterol (VENTOLIN HFA) 108 (90 Base) MCG/ACT inhaler Inhale 2 puffs into the lungs 3 (three) times daily as needed for wheezing or shortness of breath.    Yes [provider]  ALPRAZolam Duanne Moron) 0.5 MG tablet Take 0.5 mg by mouth 4 (four) times daily. Take   Yes [provider]  aspirin EC 81 MG tablet Take 81 mg by mouth daily. Swallow whole.   Yes [provider]  budesonide (PULMICORT) 0.5 MG/2ML nebulizer solution Take 2 mLs by nebulization daily. 07/25/20  Yes [provider]  carvedilol (COREG) 25 MG tablet Take 25 mg by mouth 2 (two) times daily with a meal.   Yes [provider]  Cholecalciferol (VITAMIN D-3) 125 MCG (5000 UT) TABS Take by mouth daily at 8 pm.   Yes [provider]  cloNIDine (CATAPRES - DOSED IN MG/24 HR) 0.3 mg/24hr patch Place 0.3 mg onto the skin once a week.    Yes [provider]  cyclobenzaprine (FLEXERIL) 10 MG tablet Take 20 mg by mouth at bedtime. 12/19/19  Yes [provider]  diclofenac Sodium (VOLTAREN) 1 % GEL Apply topically 4 (four) times daily.   Yes [provider]  escitalopram (LEXAPRO) 10 MG tablet Take 10 mg by mouth daily.  12/13/12  Yes [provider]  fluticasone (FLONASE) 50 MCG/ACT nasal spray  Place 2 sprays into both nostrils as needed.    Yes [provider]  furosemide (LASIX) 40 MG tablet Take 40 mg by mouth daily. 08/14/20  Yes [provider]  gabapentin (NEURONTIN) 600 MG tablet Take 1 tablet (600mg ) by mouth twice daily and take 3 tablets (1800mg ) by mouth at bedtime Patient taking differently: 3,000 mg. Pt takes all 5 tablets at bedtime 08/21/20  Yes Wendee Beavers T, MD  hydrALAZINE (APRESOLINE) 50 MG tablet Take 50 mg by mouth in the morning and at bedtime.   Yes [provider]  ibuprofen (ADVIL,MOTRIN) 800 MG tablet Take 800 mg by mouth 3 (three) times daily as needed for mild pain or moderate pain.    Yes [provider]  insulin regular human CONCENTRATED (HUMULIN R) 500 UNIT/ML kwikpen Inject 40-80 Units into  the skin See admin instructions. Uses insulin pump   Yes [provider]  ipratropium-albuterol (DUONEB) 0.5-2.5 (3) MG/3ML SOLN Take 3 mLs by nebulization every 6 (six) hours. 07/25/20  Yes [provider]  KLOR-CON M10 10 MEQ tablet Take 10 mEq by mouth daily. 06/09/21  Yes [provider]  levothyroxine (SYNTHROID) 200 MCG tablet Take 200 mcg by mouth daily before breakfast.   Yes [provider]  linaclotide (LINZESS) 145 MCG CAPS capsule Take 145 mcg by mouth daily.   Yes [provider]  magnesium oxide (MAG-OX) 400 MG tablet Take 400 mg by mouth daily.   Yes [provider]  metolazone (ZAROXOLYN) 5 MG tablet Take 5 mg by mouth 2 (two) times a week. 01/24/20 07/07/21 Yes [provider]  ondansetron (ZOFRAN) 4 MG tablet Take 4 mg by mouth every 12 (twelve) hours as needed for nausea or vomiting.  05/14/19  Yes [provider]  oxyCODONE-acetaminophen (PERCOCET) 10-325 MG tablet Take 1 tablet by mouth every 8 (eight) hours as needed for pain. Patient taking differently: Take 1 tablet by mouth daily at 8 pm. 06/14/21 08/13/21 Yes [name], MD  pantoprazole  (PROTONIX) 40 MG tablet Take 40 mg by mouth 2 (two) times daily.    Yes [provider]  pramipexole (MIRAPEX) 0.5 MG tablet Take 1 mg by mouth at bedtime.   Yes [provider]  sulfamethoxazole-trimethoprim (BACTRIM DS) 800-160 MG tablet Take 1 tablet by mouth 2 (two) times daily. 06/30/21  Yes [provider]  telmisartan (MICARDIS) 40 MG tablet Take 40 mg by mouth daily. 08/14/20  Yes [provider]  traMADol (ULTRAM) 50 MG tablet Take 100 mg by mouth at bedtime as needed.    Yes [provider]  traZODone (DESYREL) 150 MG tablet Take 150 mg by mouth at bedtime.  12/11/12  Yes [provider]  vitamin C (ASCORBIC ACID) 500 MG tablet Take 500 mg by mouth daily.   Yes [provider]  vitamin E 180 MG (400 UNITS) capsule Take 800 Units by mouth in the morning and at bedtime.   Yes [provider]  zolpidem (AMBIEN) 10 MG tablet Take 0.5 tablets (5 mg total) by mouth at bedtime. Patient taking differently: Take 10 mg by mouth at bedtime. 08/21/20  Yes Mercy Riding, MD  Insulin Human (INSULIN PUMP) SOLN Inject into the skin.    [provider]    Inpatient Medications:   aspirin EC  81 mg Oral Daily   enoxaparin (LOVENOX) injection  75 mg Subcutaneous Q24H   escitalopram  10 mg Oral Daily   furosemide  40 mg Intravenous BID   gabapentin  600 mg Oral BID   insulin pump   Subcutaneous TID WC, HS, 0200   irbesartan  75 mg Oral Daily   levothyroxine  200 mcg Oral QAC breakfast   magnesium oxide  400 mg Oral Daily   [START ON 07/09/2021] metolazone  5 mg Oral Once per day on Mon Thu   pantoprazole  40 mg Oral BID   pramipexole  1 mg Oral QHS   traZODone  150 mg Oral QHS   zolpidem  5 mg Oral QHS     Allergies:  Allergies  Allergen Reactions   Shellfish Allergy Nausea And Vomiting, Diarrhea and Nausea Only    Non stop sickness once ingests shellfish. Betadine is okay   Cephalexin Other (See Comments)     Doesn't digest it well   Amlodipine  Cough   Codeine Nausea And Vomiting and Nausea Only    states she can take codeine cough syrup without problems states she can take codeine cough syrup without problems   Imdur [Isosorbide Dinitrate] Other (See Comments) and Cough    Headache   Lyrica [Pregabalin] Swelling and Other (See Comments)    "FLU-LIKE" SYMPTOMS   Monosodium Glutamate Other (See Comments) and Cough    Headache   Tape Other (See Comments) and Nausea And Vomiting    can use paper tape Redness    Social History   Socioeconomic History   Marital status: Single    Spouse name: Not on file   Number of children: Not on file   Years of education: Not on file   Highest education level: Not on file  Occupational History   Not on file  Tobacco Use   Smoking status: Never   Smokeless tobacco: Never  Vaping Use   Vaping Use: Never used  Substance and Sexual Activity   Alcohol use: No   Drug use: Never   Sexual activity: Not Currently  Other Topics Concern   Not on file  Social History Narrative   Not on file   Social Determinants of Health   Financial Resource Strain: Not on file  Food Insecurity: Not on file  Transportation Needs: Not on file  Physical Activity: Not on file  Stress: Not on file  Social Connections: Not on file  Intimate Partner Violence: Not on file     Family History  Problem Relation Age of Onset   Heart disease Mother    Cancer Mother    Asthma Mother    Diabetes Father    Kidney disease Father    Hypertension Father    Breast cancer Neg Hx      Review of Systems: A 12-system review of systems was performed and is negative except as noted in the HPI.  Labs: No results for input(s): CKTOTAL, CKMB, TROPONINI in the last 72 hours. Lab Results  Component Value Date   WBC 6.4 07/07/2021   HGB 10.7 (L) 07/07/2021   HCT 32.7 (L) 07/07/2021   MCV 91.6 07/07/2021   PLT 177 07/07/2021    Recent Labs  Lab 07/07/21 1547  NA 135  K  4.4  CL 96*  CO2 36*  BUN 35*  CREATININE 1.15*  CALCIUM 9.5  PROT 6.5  BILITOT 0.6  ALKPHOS 78  ALT 23  AST 27  GLUCOSE 180*   Lab Results  Component Value Date   CHOL 196 08/18/2020   HDL 59 08/18/2020   LDLCALC 114 (H) 08/18/2020   TRIG 113 08/18/2020   No results found for: DDIMER  Radiology/Studies:  DG Chest 2 View  Result Date: 07/07/2021 CLINICAL DATA:  Shortness of breath and history of recent fall EXAM: CHEST - 2 VIEW COMPARISON:  08/18/2020 FINDINGS: Cardiac shadow is enlarged. Vascular congestion is noted with mild interstitial edema new from the prior exam. Spinal stimulator is seen as well as postsurgical changes in the cervical spine. No acute bony abnormality is noted. No focal infiltrate is seen. IMPRESSION: Mild CHF. Electronically Signed   By: Inez Catalina M.D.   On: 07/07/2021 17:18   DG Tibia/Fibula Left  Result Date: 07/07/2021 CLINICAL DATA:  History of recent fall with left leg pain, initial encounter EXAM: LEFT TIBIA AND FIBULA - 2 VIEW COMPARISON:  None. FINDINGS: Left knee prosthesis is noted. There is an oblique fracture through the distal fibular metaphysis with  only minimal displacement. Generalized soft tissue swelling is noted about the ankle. Mild cortical irregularity is noted in the distal tibial metaphysis. Undisplaced cortical fracture could not be totally excluded. Calcaneal spurring is noted. IMPRESSION: Undisplaced distal fibular fracture with associated soft tissue swelling. Findings suspicious for distal tibial fracture as described. Electronically Signed   By: Inez Catalina M.D.   On: 07/07/2021 17:20   DG Tibia/Fibula Right  Result Date: 07/07/2021 CLINICAL DATA:  Recent fall with right lower leg pain, initial encounter EXAM: RIGHT TIBIA AND FIBULA - 2 VIEW COMPARISON:  None. FINDINGS: Degenerative changes are noted about the knee joint. Calcaneal spurring is noted. No acute fracture or dislocation is seen. No gross soft tissue abnormality  is noted. IMPRESSION: No acute fracture is seen. Electronically Signed   By: Inez Catalina M.D.   On: 07/07/2021 17:20   CT Head Wo Contrast  Result Date: 07/07/2021 CLINICAL DATA:  Head trauma, mental status changes of unknown cause. EXAM: CT HEAD WITHOUT CONTRAST TECHNIQUE: Contiguous axial images were obtained from the base of the skull through the vertex without intravenous contrast. COMPARISON:  Prior studies from August of 2021 in July of 2013 FINDINGS: Brain: No evidence of acute infarction, hemorrhage, hydrocephalus, extra-axial collection or mass lesion/mass effect. Signs of generalized atrophy as before. Areas of parenchymal calcification also similar to prior imaging. Unchanged enlargement of LEFT sylvian fissure without mass effect. 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 Vascular: No hyperdense vessel or unexpected calcification. Skull: Normal. Negative for fracture or focal lesion. Sinuses/Orbits: Visualized paranasal sinuses and orbits are unremarkable. Other: None. IMPRESSION: 1. No acute intracranial pathology. 2. Signs of generalized atrophy and sylvian fissure enlargement as well as prior infarcts similar to the previous CT evaluation. Electronically Signed   By: Zetta Bills M.D.   On: 07/07/2021 19:44   DG Foot Complete Left  Result Date: 07/07/2021 CLINICAL DATA:  Recent fall with left foot pain, initial EXAM: LEFT FOOT - COMPLETE 3+ VIEW COMPARISON:  None. FINDINGS: Undisplaced distal fibular fracture is again seen with soft tissue swelling. Considerable soft tissue swelling is noted in the foot. No other fracture is seen. IMPRESSION: Soft tissue swelling. Distal fibular fracture is again noted. Electronically Signed   By: Inez Catalina M.D.   On: 07/07/2021 17:23   DG Foot Complete Right  Result Date: 07/07/2021 CLINICAL DATA:  Recent fall with right foot pain, initial encounter EXAM: RIGHT FOOT COMPLETE - 3+ VIEW COMPARISON:  None. FINDINGS: Examination is  somewhat limited due to overlying extrinsic artifact. Considerable soft tissue swelling is noted about the metatarsals dorsally. Calcaneal spurring is seen. Irregularity at the base of the first distal phalanx which may represent an undisplaced fracture. Correlate to point tenderness. No other focal abnormality is noted. Healed fibular fracture is noted distally. IMPRESSION: Question fracture of the first distal phalanx as described. Healed distal right fibular fracture Electronically Signed   By: Inez Catalina M.D.   On: 07/07/2021 17:22    Wt Readings from Last 3 Encounters:  07/07/21 (!) 148.3 kg  05/14/21 (!) 153.9 kg  04/09/21 133.8 kg    EKG: nsr with no ischemia  Physical Exam:  Blood pressure (!) 132/57, pulse 72, temperature 98.6 F (37 C), temperature source Oral, resp. rate 19, height 5\' 6"  (1.676 m), weight (!) 148.3 kg, SpO2 93 %. Body mass index is 52.78 kg/m. General: Well developed, well nourished, in no acute distress. Head: Normocephalic, atraumatic, sclera non-icteric, no xanthomas, nares are without discharge.  Neck: Negative for carotid bruits. JVD not elevated. Lungs: Clear bilaterally to auscultation without wheezes, rales, or rhonchi. Breathing is unlabored. Heart: RRR with S1 S2. No murmurs, rubs, or gallops appreciated. Abdomen: Soft, non-tender, non-distended with normoactive bowel sounds. No hepatomegaly. No rebound/guarding. No obvious abdominal masses. Msk:  Strength and tone appear normal for age. Extremities: No clubbing or cyanosis. No edema.  Distal pedal pulses are 2+ and equal bilaterally. Neuro: Alert and oriented X 3. No facial asymmetry. No focal deficit. Moves all extremities spontaneously. Psych:  Responds to questions appropriately with a normal affect.     Assessment and Plan  Preserved lv function. Careful diuresis. Will follow.   Signed, Teodoro Spray MD 07/08/2021, 8:54 AM Pager: [phone]

## 2021-07-08 NOTE — Evaluation (Signed)
Physical Therapy Evaluation Patient Details Name: Brittney [name] MRN: [id number] DOB: 06/26/1951 Today's Date: 07/08/2021   History of Present Illness  Pt is a 70 yo female that presented to the ED after a fall at home. 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   Clinical Impression  Patient A&Ox4, very limited by pain this session. Reported significant frequent sharp/shooting pains to LLE. At baseline pt reported that she stand pivots on LLE to transfer, has an aide that assist with IADLs/home making duties 3 hrs a day.   The patient was able to move Ue's against gravity without assist. Very little movement noted on RLE, but able to initiate knee flexion. Very limited assessment of LLE due to pain, but quad set noted and IR/ER of LLE. Able to use abdominals to pull up to lean forward in bed, effortful for patient. Time spent on education of continued mobility, pt status, and potential discharge options. The patient would benefit from further skilled PT intervention to maximize function and safety to return home as able. Recommendation is SNF due to current level of assistance needed and decreased caregiver support (aide unable to provide physical assist). If patient was to discharge home, she would need 24/7 assistance as well as a hoyer lift (bariatric).      Follow Up Recommendations SNF    Equipment Recommendations  Other (comment) (TBD at next venue of care)    Recommendations for Other Services OT consult     Precautions / Restrictions Precautions Precautions: Fall Restrictions Weight Bearing Restrictions: Yes LLE Weight Bearing: Touchdown weight bearing      Mobility  Bed Mobility               General bed  mobility comments: deferred due to elevated patient pain    Transfers                    Ambulation/Gait                Stairs            Wheelchair Mobility    Modified Rankin (Stroke Patients Only)       Balance                                             Pertinent Vitals/Pain Pain Assessment: 0-10 Pain Score: 8  Pain Location: L leg, ankle Pain Descriptors / Indicators: Sharp;Shooting;Guarding;Moaning;Grimacing;Crying Pain Intervention(s): Limited activity within patient's tolerance;Monitored during session;Premedicated before session;Repositioned;Patient requesting pain meds-RN notified    Home Living Family/patient expects to be discharged to:: Private residence Living Arrangements: Alone Available Help at Discharge: Home health;Family;Available PRN/intermittently (has an aide that assists 3 hours a day 5 days a week) Type of Home: Mobile home Home Access: Stairs to enter;Ramped entrance     Home Layout: One level Home Equipment: [place] - 2 wheels;Tub bench;Walker - standard;Wheelchair - power;Wheelchair - manual;Toilet riser;Bedside commode      Prior Function Level of Independence: Independent with assistive device(s);Needs assistance   Gait / Transfers Assistance Needed: per pt, able to stand pivot for transfers in home  ADL's / Homemaking Assistance Needed:  set up for ADLs if needed; meals on wheels, aide assist with homemaking duties        Hand Dominance   Dominant Hand: Left (pt reported she uses both hands)    Extremity/Trunk Assessment   Upper Extremity Assessment Upper Extremity Assessment:  (able to lift against gravity BLE)    Lower Extremity Assessment Lower Extremity Assessment:  (unable to move LLE due to pain, minimal movement noted in RLE due to previous CVA; able to initiate knee flexion)       Communication   Communication: No difficulties  Cognition Arousal/Alertness:  Awake/alert Behavior During Therapy: WFL for tasks assessed/performed Overall Cognitive Status: Within Functional Limits for tasks assessed                                        General Comments      Exercises Other Exercises Other Exercises: BUE shoulder flexion, elbow flexion/extension x10 AROM. unable to participate in LE exercises due to significant pain, but minimal quad set noted with LLE movement, able to slightly bend knee   Assessment/Plan    PT Assessment Patient needs continued PT services  PT Problem List Decreased strength;Decreased range of motion;Decreased activity tolerance;Decreased balance;Decreased mobility;Pain       PT Treatment Interventions DME instruction;Balance training;Gait training;Neuromuscular re-education;Stair training;Functional mobility training;Patient/family education;Therapeutic activities;Therapeutic exercise;Wheelchair mobility training    PT Goals (Current goals can be found in the Care Plan section)  Acute Rehab PT Goals Patient Stated Goal: to get back to normal PT Goal Formulation: With patient Time For Goal Achievement: 07/22/21 Potential to Achieve Goals: Fair    Frequency Min 2X/week   Barriers to discharge Decreased caregiver support      Co-evaluation               AM-PAC PT "6 Clicks" Mobility  Outcome Measure Help needed turning from your back to your side while in a flat bed without using bedrails?: Total Help needed moving from lying on your back to sitting on the side of a flat bed without using bedrails?: Total Help needed moving to and from a bed to a chair (including a wheelchair)?: Total Help needed standing up from a chair using your arms (e.g., wheelchair or bedside chair)?: Total Help needed to walk in hospital room?: Total Help needed climbing 3-5 steps with a railing? : Total 6 Click Score: 6    End of Session   Activity Tolerance: Patient limited by pain Patient left: in bed;with  call bell/phone within reach Nurse Communication: Mobility status PT Visit Diagnosis: Other abnormalities of gait and mobility (R26.89);Pain;Muscle weakness (generalized) (M62.81) Pain - Right/Left: Left Pain - part of body: Leg;Ankle and joints of foot    Time: 1350-1416 PT Time Calculation (min) (ACUTE ONLY): 26 min   Charges:   PT Evaluation $PT Eval Moderate Complexity: 1 Mod PT Treatments $Therapeutic Activity: 8-22 mins       Lieutenant Diego PT, DPT 4:04 PM,07/08/21

## 2021-07-08 NOTE — Progress Notes (Signed)
Report given to Ramanjit RN. Patient to be transported to room.

## 2021-07-08 NOTE — Progress Notes (Signed)
Wound assessment is done by me and Chelsey, RN but due to her wound covered with ace wrap by wound nurse, was not able to assess. Wound nurse is following this patient and this patient go to wound and pain clinic for her chronic wounds.

## 2021-07-08 NOTE — Consult Note (Addendum)
[facility] Nurse Consult Note: Reason for Consult: Consult requested for bilat legs prior to ortho service involvement. They have performed a consult for left leg fracture and pt has an ace wrap applied. Anterior calf without open wound; large amt generalized edema, dark purple raised hematoma below the skin level, 7X7cm. Pt states she has a "diabetic callous wound" to her right plantar heel and has been followed by a podiatrist prior to admission for filing of the location. She is in significant pain and requests this leg not be moved or unwrapped to assess the site. Please refer to the ortho service for further plan of care for LLE. Pt states right leg has been followed prior to admission at the outpatient wound care center for a chronic full thickness wound to right anterior calf. 8X7X.2cm, 80% red, 20% yellow wound bed, large amt dark red drainage, no odor or fluctuance.  Dressing procedure/placement/frequency: Float heels to reduce pressure. Continue with present plan of care as previously ordered by the outpatient wound care center:  Applied hydrofiber, then Sorbact dressing, then [employer] and [employer]an to RLE.  [facility] team will change again next Wed if pt is still in the hospital at that time.  Pt plans to follow-up with the outpatient wound care center after discharge.  Julien Girt MSN, RN, [place], [place], [place]

## 2021-07-08 NOTE — ED Notes (Signed)
Pt moved to room 35. Pts linen soiled. New linen and new external catheter in place attached to suction. Pt readjusted upward in bed for better breathing and oxygenation. Pt found to have insulin pump on and writer removed pump and Leslie Dales MD for sliding scale order due to inability to monitor pts pump. New CBG obtained as well at this time.   CBG 72

## 2021-07-08 NOTE — Progress Notes (Signed)
Patient is currently connected to own insulin pump and requests to manage herself.   Repositioned on stretcher and PRN medications given for pain. Denies further needs at this time.

## 2021-07-08 NOTE — Progress Notes (Addendum)
Inpatient Diabetes Program Recommendations  AACE/ADA: New Consensus Statement on Inpatient Glycemic Control (2015)  Target Ranges:  Prepandial:   less than 140 mg/dL      Peak postprandial:   less than 180 mg/dL (1-2 hours)      Critically ill patients:  140 - 180 mg/dL   Results for [name], Brittney [name] "PAM" (MRN [id number]) as of 07/08/2021 11:43  Ref. Range 07/08/2021 05:42 07/08/2021 08:01  Glucose-Capillary Latest Ref Range: 70 - 99 mg/dL 72 93    Admit with: Possible new onset CHF, subacute/ Venous stasis ulcers of both lower extremities/ Left distal fibula fracture secondary to fall at home/ History of frequent falls/ Physical deconditioning /Wheelchair dependent/BMI of 50  History: DM2  Home DM Meds: Insulin Pump  Current Orders: Insulin Pump ac/hs/2am  11am--Met w/ pt at bedside down in the ED.  Pt A&O and able to independently manage her insulin pump.  Pump currently on and running.  Reviewed pump settings with pt (see below).  No changes made to pt's pump at last ENDO visit.  Pt told me her daughter is coming this afternoon with pump supplies and she and her daughter will change her set/site this afternoon.  Discussed with pt that the nursing staff will need to check fingerstick CBGs with the hospital meter--pt agreeable.  Also asked pt to please alert RN to any and all bolsues given by pt with her insulin pump.  Also talked w/ ED RN and reviewed charting needs.  RN in the ED did not have any questions as of this time regarding pt's insulin pump.    Endocrinologist: Dr. Gabriel Carina with [facility] Last seen 07/06/2021 Med[employer]onic Insulin Pump 630g Humulin- R U-500 Insulin Basal Rates:  12am- 1.25 units/hr 6:30am- 1.45 units/hr 11am- 1.75 units/hr 10:30pm- 1.35 units/hr Total Basal per 24 hour period= 36.8 units of U-500 Insulin (equals 187 units of the U-100 Insulin) Bolus settings:  Carb Ratio= 5 Sensitivity= 19 Target CBG= 101-150    --Will follow patient during  hospitalization--  Wyn Quaker RN, MSN, CDE Diabetes Coordinator Inpatient Glycemic Control Team Team Pager: [phone] (8a-5p)

## 2021-07-08 NOTE — Progress Notes (Signed)
Patient wants to use her own device to check her blood sugar. I asked her we can check here at the hospital but she is refusing it. She said her daughter is brining glucose monitor. Blood glucose and Insulin monitoring paper is been provided to the patient with instructions. She voiced understanding. Dr. Roosevelt Locks has been notified.

## 2021-07-08 NOTE — Consult Note (Signed)
Reason for Consult: Left bimalleolar ankle fracture Referring Physician: Dr. Myrtie Hawk is an 70 y.o. female.  HPI: Patient is a 70 year old minimal ambulator who suffered a fall at home twisting her left ankle.  She has had significant lower extremity swelling recently.  She has a history of prior CVA with residual right-sided weakness she mainly is wheelchair-bound and pivots on her left foot to get up.  She has an extensive subcutaneous hematoma over the left anterior leg and severe left ankle pain.  Past Medical History:  Diagnosis Date   Abdominal pain, left upper quadrant 12/11/2012   Anemia    Arthritis    Asthma    Broken leg 2014   fell twice and broke same leg. had a bone stimulator for first break.   Cancer ([place]) 2007   melanoma, left ear and back of left leg, removed from back   Chronic kidney disease, stage 2 (mild)    Chronic lower back pain    Collagen vascular disease (HCC)    Complication of anesthesia    slow to wake, coughing   Coronary artery disease    Diabetes mellitus without complication ([place])    Diabetic nephropathy associated with secondary diabetes mellitus ([place])    Flushing 12/11/2012   Gastroparesis    Gross hematuria 12/11/2012   Hepatic cirrhosis ([place])    History of kidney stones    Hyperlipidemia    Hypertension    Hypothyroidism    Hypothyroidism    IBS (irritable bowel syndrome)    Kidney stone 12/11/2012   Lower extremity edema    Morbid obesity with BMI of 40.0-44.9, adult (HCC)    Motion sickness    back seat of cars   MRSA (methicillin resistant staph aureus) culture positive 2016   Myocardial infarction ([place]) 09/2013   Nausea without vomiting 12/11/2012   Nephrolithiasis    Nephrolithiasis 04/26/2014   Nonproliferative retinopathy due to secondary diabetes (HCC)    Numbness and tingling of right leg    Peripheral vascular disease ([place])    Renal colic 89/21/1941   Renal insufficiency    Sciatica    Sciatica  12/11/2012   Skin cancer 08/2018   Sleep apnea    USES CPAP   Spinal cord stimulator status    battery in left buttock   Stroke ([place]) 07/2020   Unilateral small kidney without contralateral hypertrophy    Wears dentures    full lower   Wheelchair dependent    reports able to transfer with assist    Past Surgical History:  Procedure Laterality Date   ABDOMINAL HYSTERECTOMY     BACK SURGERY  04/1999   L-4-5 LAMINECTOMY. metal in lower back and neck   [facility]     COLONOSCOPY  05/04/2001   COLONOSCOPY WITH PROPOFOL N/A 09/20/2016   Procedure: COLONOSCOPY WITH PROPOFOL;  Surgeon: Lollie Sails, MD;  Location: [facility] ENDOSCOPY;  Service: Endoscopy;  Laterality: N/A;   DILATION AND CURETTAGE OF UTERUS     ESOPHAGOGASTRODUODENOSCOPY  02/08/2014   ESOPHAGOGASTRODUODENOSCOPY (EGD) WITH PROPOFOL N/A 09/20/2016   Procedure: ESOPHAGOGASTRODUODENOSCOPY (EGD) WITH PROPOFOL;  Surgeon: Lollie Sails, MD;  Location: [facility] ENDOSCOPY;  Service: Endoscopy;  Laterality: N/A;   ESOPHAGOGASTRODUODENOSCOPY (EGD) WITH PROPOFOL N/A 05/23/2018   Procedure: ESOPHAGOGASTRODUODENOSCOPY (EGD) WITH PROPOFOL;  Surgeon: Lollie Sails, MD;  Location: [facility] ENDOSCOPY;  Service: Endoscopy;  Laterality: N/A;   ESOPHAGOGASTRODUODENOSCOPY (EGD) WITH PROPOFOL N/A 07/18/2020   Procedure: ESOPHAGOGASTRODUODENOSCOPY (EGD)  WITH PROPOFOL;  Surgeon: Lesly Rubenstein, MD;  Location[facility] ENDOSCOPY;  Service: Gastroenterology;  Laterality: N/A;   EYE SURGERY  1986   FOR MELANOMA   HERNIA REPAIR     JOINT REPLACEMENT Left 09/24/2013   TOTAL KNEE   MOHS SURGERY  11/2016   left side of nose   PULSE GENERATOR IMPLANT N/A 11/02/2017   Procedure: UNILATERAL PULSE GENERATOR IMPLANT;  Surgeon: Meade Maw, MD;  Location: [facility] ORS;  Service: Neurosurgery;  Laterality: N/A;   TONSILLECTOMY     WITH ADENOIDECTOMY    Family History  Problem Relation Age of Onset   Heart disease Mother     Cancer Mother    Asthma Mother    Diabetes Father    Kidney disease Father    Hypertension Father    Breast cancer Neg Hx     Social History:  reports that she has never smoked. She has never used smokeless tobacco. She reports that she does not drink alcohol and does not use drugs.  Allergies:  Allergies  Allergen Reactions   Shellfish Allergy Nausea And Vomiting, Diarrhea and Nausea Only    Non stop sickness once ingests shellfish. Betadine is okay   Cephalexin Other (See Comments)    Doesn't digest it well   Amlodipine Cough   Codeine Nausea And Vomiting and Nausea Only    states she can take codeine cough syrup without problems states she can take codeine cough syrup without problems   Imdur [Isosorbide Dinitrate] Other (See Comments) and Cough    Headache   Lyrica [Pregabalin] Swelling and Other (See Comments)    "FLU-LIKE" SYMPTOMS   Monosodium Glutamate Other (See Comments) and Cough    Headache   Tape Other (See Comments) and Nausea And Vomiting    can use paper tape Redness    Medications: I have reviewed the patient's current medications.  Results for orders placed or performed during the hospital encounter of 07/07/21 (from the past 48 hour(s))  Comprehensive metabolic panel     Status: Abnormal   Collection Time: 07/07/21  3:47 PM  Result Value Ref Range   Sodium 135 135 - 145 mmol/L   Potassium 4.4 3.5 - 5.1 mmol/L   Chloride 96 (L) 98 - 111 mmol/L   CO2 36 (H) 22 - 32 mmol/L   Glucose, Bld 180 (H) 70 - 99 mg/dL    Comment: Glucose reference range applies only to samples taken after fasting for at least 8 hours.   BUN 35 (H) 8 - 23 mg/dL   Creatinine, Ser 1.15 (H) 0.44 - 1.00 mg/dL   Calcium 9.5 8.9 - 10.3 mg/dL   Total Protein 6.5 6.5 - 8.1 g/dL   Albumin 3.1 (L) 3.5 - 5.0 g/dL   AST 27 15 - 41 U/L   ALT 23 0 - 44 U/L   Alkaline Phosphatase 78 38 - 126 U/L   Total Bilirubin 0.6 0.3 - 1.2 mg/dL   GFR, Estimated 52 (L) >60 mL/min    Comment:  (NOTE) Calculated using the CKD-EPI Creatinine Equation (2021)    Anion gap 3 (L) 5 - 15    Comment: Performed at [facility], [address] [place], [place] [address]  CBC with Differential     Status: Abnormal   Collection Time: 07/07/21  3:47 PM  Result Value Ref Range   WBC 6.4 4.0 - 10.5 K/uL   RBC 3.57 (L) 3.87 - 5.11 MIL/uL   Hemoglobin 10.7 (L) 12.0 -  15.0 g/dL   HCT 32.7 (L) 36.0 - 46.0 %   MCV 91.6 80.0 - 100.0 fL   MCH 30.0 26.0 - 34.0 pg   MCHC 32.7 30.0 - 36.0 g/dL   RDW 13.8 11.5 - 15.5 %   Platelets 177 150 - 400 K/uL   nRBC 0.0 0.0 - 0.2 %   Neutrophils Relative % 57 %   Neutro Abs 3.7 1.7 - 7.7 K/uL   Lymphocytes Relative 29 %   Lymphs Abs 1.9 0.7 - 4.0 K/uL   Monocytes Relative 7 %   Monocytes Absolute 0.4 0.1 - 1.0 K/uL   Eosinophils Relative 6 %   Eosinophils Absolute 0.4 0.0 - 0.5 K/uL   Basophils Relative 1 %   Basophils Absolute 0.1 0.0 - 0.1 K/uL   Immature Granulocytes 0 %   Abs Immature Granulocytes 0.01 0.00 - 0.07 K/uL    Comment: Performed at [facility], [address] [place], [place] [address]  Brain natriuretic peptide     Status: None   Collection Time: 07/07/21  3:47 PM  Result Value Ref Range   B Natriuretic Peptide 81.9 0.0 - 100.0 pg/mL    Comment: Performed at [facility], [address] [place], [place] [address]  Blood gas, venous     Status: Abnormal   Collection Time: 07/07/21  3:57 PM  Result Value Ref Range   pH, Ven 7.41 7.250 - 7.430   pCO2, Ven 60 44.0 - 60.0 mmHg   pO2, Ven 52.0 (H) 32.0 - 45.0 mmHg   Bicarbonate 38.0 (H) 20.0 - 28.0 mmol/L   Acid-Base Excess 11.4 (H) 0.0 - 2.0 mmol/L   O2 Saturation 86.7 %   Patient temperature 37.0    Sample type VENOUS     Comment: Performed at [facility], [address] [place],  [address]  Resp Panel by RT-PCR (Flu A&B, Covid) Nasopharyngeal Swab     Status: None   Collection Time: 07/07/21  8:00 PM   Specimen:  Nasopharyngeal Swab; Nasopharyngeal(NP) swabs in vial transport medium  Result Value Ref Range   SARS Coronavirus 2 by RT PCR NEGATIVE NEGATIVE    Comment: (NOTE) SARS-CoV-2 target nucleic acids are NOT DETECTED.  The SARS-CoV-2 RNA is generally detectable in upper respiratory specimens during the acute phase of infection. The lowest concentration of SARS-CoV-2 viral copies this assay can detect is 138 copies/mL. A negative result does not preclude SARS-Cov-2 infection and should not be used as the sole basis for treatment or other patient management decisions. A negative result may occur with  improper specimen collection/handling, submission of specimen other than nasopharyngeal swab, presence of viral mutation(s) within the areas targeted by this assay, and inadequate number of viral copies(<138 copies/mL). A negative result must be combined with clinical observations, patient history, and epidemiological information. The expected result is Negative.  Fact Sheet for Patients:  EntrepreneurPulse.com.au  Fact Sheet for Healthcare Providers:  IncredibleEmployment.be  This test is no t yet approved or cleared by the [place] FDA and  has been authorized for detection and/or diagnosis of SARS-CoV-2 by FDA under an Emergency Use Authorization (EUA). This EUA will remain  in effect (meaning this test can be used) for the duration of the COVID-19 declaration under Section 564(b)(1) of the Act, 21 U.S.C.section 360bbb-3(b)(1), unless the authorization is terminated  or revoked sooner.       Influenza A by PCR NEGATIVE NEGATIVE   Influenza B by PCR NEGATIVE NEGATIVE  Comment: (NOTE) The Xpert Xpress SARS-CoV-2/FLU/RSV plus assay is intended as an aid in the diagnosis of influenza from Nasopharyngeal swab specimens and should not be used as a sole basis for treatment. Nasal washings and aspirates are unacceptable for Xpert Xpress  SARS-CoV-2/FLU/RSV testing.  Fact Sheet for Patients: EntrepreneurPulse.com.au  Fact Sheet for Healthcare Providers: IncredibleEmployment.be  This test is not yet approved or cleared by the [place] FDA and has been authorized for detection and/or diagnosis of SARS-CoV-2 by FDA under an Emergency Use Authorization (EUA). This EUA will remain in effect (meaning this test can be used) for the duration of the COVID-19 declaration under Section 564(b)(1) of the Act, 21 U.S.C. section 360bbb-3(b)(1), unless the authorization is terminated or revoked.  Performed at [facility], [address] [place], [place] [address]   CBG monitoring, ED     Status: None   Collection Time: 07/08/21  5:42 AM  Result Value Ref Range   Glucose-Capillary 72 70 - 99 mg/dL    Comment: Glucose reference range applies only to samples taken after fasting for at least 8 hours.  CBG monitoring, ED     Status: None   Collection Time: 07/08/21  8:01 AM  Result Value Ref Range   Glucose-Capillary 93 70 - 99 mg/dL    Comment: Glucose reference range applies only to samples taken after fasting for at least 8 hours.   Comment 1 Notify RN     DG Chest 2 View  Result Date: 07/07/2021 CLINICAL DATA:  Shortness of breath and history of recent fall EXAM: CHEST - 2 VIEW COMPARISON:  08/18/2020 FINDINGS: Cardiac shadow is enlarged. Vascular congestion is noted with mild interstitial edema new from the prior exam. Spinal stimulator is seen as well as postsurgical changes in the cervical spine. No acute bony abnormality is noted. No focal infiltrate is seen. IMPRESSION: Mild CHF. Electronically Signed   By: Inez Catalina M.D.   On: 07/07/2021 17:18   DG Tibia/Fibula Left  Result Date: 07/07/2021 CLINICAL DATA:  History of recent fall with left leg pain, initial encounter EXAM: LEFT TIBIA AND FIBULA - 2 VIEW COMPARISON:  None. FINDINGS: Left knee prosthesis is noted. There  is an oblique fracture through the distal fibular metaphysis with only minimal displacement. Generalized soft tissue swelling is noted about the ankle. Mild cortical irregularity is noted in the distal tibial metaphysis. Undisplaced cortical fracture could not be totally excluded. Calcaneal spurring is noted. IMPRESSION: Undisplaced distal fibular fracture with associated soft tissue swelling. Findings suspicious for distal tibial fracture as described. Electronically Signed   By: Inez Catalina M.D.   On: 07/07/2021 17:20   DG Tibia/Fibula Right  Result Date: 07/07/2021 CLINICAL DATA:  Recent fall with right lower leg pain, initial encounter EXAM: RIGHT TIBIA AND FIBULA - 2 VIEW COMPARISON:  None. FINDINGS: Degenerative changes are noted about the knee joint. Calcaneal spurring is noted. No acute fracture or dislocation is seen. No gross soft tissue abnormality is noted. IMPRESSION: No acute fracture is seen. Electronically Signed   By: Inez Catalina M.D.   On: 07/07/2021 17:20   CT Head Wo Contrast  Result Date: 07/07/2021 CLINICAL DATA:  Head trauma, mental status changes of unknown cause. EXAM: CT HEAD WITHOUT CONTRAST TECHNIQUE: Contiguous axial images were obtained from the base of the skull through the vertex without intravenous contrast. COMPARISON:  Prior studies from August of 2021 in July of 2013 FINDINGS: Brain: No evidence of acute infarction, hemorrhage, hydrocephalus, extra-axial collection or  mass lesion/mass effect. Signs of generalized atrophy as before. Areas of parenchymal calcification also similar to prior imaging. Unchanged enlargement of LEFT sylvian fissure without mass effect. 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 Vascular: No hyperdense vessel or unexpected calcification. Skull: Normal. Negative for fracture or focal lesion. Sinuses/Orbits: Visualized paranasal sinuses and orbits are unremarkable. Other: None. IMPRESSION: 1. No acute intracranial pathology. 2.  Signs of generalized atrophy and sylvian fissure enlargement as well as prior infarcts similar to the previous CT evaluation. Electronically Signed   By: Zetta Bills M.D.   On: 07/07/2021 19:44   DG Foot Complete Left  Result Date: 07/07/2021 CLINICAL DATA:  Recent fall with left foot pain, initial EXAM: LEFT FOOT - COMPLETE 3+ VIEW COMPARISON:  None. FINDINGS: Undisplaced distal fibular fracture is again seen with soft tissue swelling. Considerable soft tissue swelling is noted in the foot. No other fracture is seen. IMPRESSION: Soft tissue swelling. Distal fibular fracture is again noted. Electronically Signed   By: Inez Catalina M.D.   On: 07/07/2021 17:23   DG Foot Complete Right  Result Date: 07/07/2021 CLINICAL DATA:  Recent fall with right foot pain, initial encounter EXAM: RIGHT FOOT COMPLETE - 3+ VIEW COMPARISON:  None. FINDINGS: Examination is somewhat limited due to overlying extrinsic artifact. Considerable soft tissue swelling is noted about the metatarsals dorsally. Calcaneal spurring is seen. Irregularity at the base of the first distal phalanx which may represent an undisplaced fracture. Correlate to point tenderness. No other focal abnormality is noted. Healed fibular fracture is noted distally. IMPRESSION: Question fracture of the first distal phalanx as described. Healed distal right fibular fracture Electronically Signed   By: Inez Catalina M.D.   On: 07/07/2021 17:22    Review of Systems Blood pressure (!) 122/53, pulse 72, temperature 98.6 F (37 C), temperature source Oral, resp. rate 19, height 5\' 6"  (1.676 m), weight (!) 148.3 kg, SpO2 95 %. Physical Exam She is tender over the medial lateral malleolus with above-noted hematoma.  She is able flex extend the toes. Assessment/Plan: Radiology review I think there is a medial malleolus fracture that is nondisplaced as well along with the distal fibular shaft fracture.  Ankle mortise is intact.  With this in her weight and  limited ambulation ability I am worried that if she does bear weight and pivot on this left ankle she may get displacement of this fracture and need ORIF.  With her soft tissue problems of venous stasis problems I think that would be higher risk than normal.  Hopefully we can treat this nonoperatively but she will need to be toe-touch weightbearing on the left and on a trip he should we will maintain that.  I suspect she will need prolonged nursing home care/rehab to address this problem and adequately heal without surgery.  She lives alone.  She will need close follow-up with her fracture if there is displacement she will need ORIF  Hessie Knows 07/08/2021, 12:43 PM

## 2021-07-08 NOTE — Plan of Care (Signed)

## 2021-07-08 NOTE — Progress Notes (Signed)
Patient moved from stretcher to hospital bed.

## 2021-07-09 DIAGNOSIS — I5032 Chronic diastolic (congestive) heart failure: Secondary | ICD-10-CM

## 2021-07-09 DIAGNOSIS — L03115 Cellulitis of right lower limb: Secondary | ICD-10-CM | POA: Diagnosis not present

## 2021-07-09 DIAGNOSIS — S82832A Other fracture of upper and lower end of left fibula, initial encounter for closed fracture: Secondary | ICD-10-CM | POA: Diagnosis not present

## 2021-07-09 LAB — CBC WITH DIFFERENTIAL/PLATELET
Abs Immature Granulocytes: 0.01 10*3/uL (ref 0.00–0.07)
Basophils Absolute: 0 10*3/uL (ref 0.0–0.1)
Basophils Relative: 1 %
Eosinophils Absolute: 0.4 10*3/uL (ref 0.0–0.5)
Eosinophils Relative: 7 %
HCT: 32.4 % — ABNORMAL LOW (ref 36.0–46.0)
Hemoglobin: 10.4 g/dL — ABNORMAL LOW (ref 12.0–15.0)
Immature Granulocytes: 0 %
Lymphocytes Relative: 41 %
Lymphs Abs: 2.2 10*3/uL (ref 0.7–4.0)
MCH: 30 pg (ref 26.0–34.0)
MCHC: 32.1 g/dL (ref 30.0–36.0)
MCV: 93.4 fL (ref 80.0–100.0)
Monocytes Absolute: 0.4 10*3/uL (ref 0.1–1.0)
Monocytes Relative: 8 %
Neutro Abs: 2.4 10*3/uL (ref 1.7–7.7)
Neutrophils Relative %: 43 %
Platelets: 163 10*3/uL (ref 150–400)
RBC: 3.47 MIL/uL — ABNORMAL LOW (ref 3.87–5.11)
RDW: 13.8 % (ref 11.5–15.5)
WBC: 5.5 10*3/uL (ref 4.0–10.5)
nRBC: 0 % (ref 0.0–0.2)

## 2021-07-09 LAB — BASIC METABOLIC PANEL
Anion gap: 7 (ref 5–15)
BUN: 24 mg/dL — ABNORMAL HIGH (ref 8–23)
CO2: 33 mmol/L — ABNORMAL HIGH (ref 22–32)
Calcium: 9.5 mg/dL (ref 8.9–10.3)
Chloride: 98 mmol/L (ref 98–111)
Creatinine, Ser: 1.14 mg/dL — ABNORMAL HIGH (ref 0.44–1.00)
GFR, Estimated: 52 mL/min — ABNORMAL LOW (ref >60)
Glucose, Bld: 134 mg/dL — ABNORMAL HIGH (ref 70–99)
Potassium: 4.2 mmol/L (ref 3.5–5.1)
Sodium: 138 mmol/L (ref 135–145)

## 2021-07-09 LAB — MAGNESIUM: Magnesium: 2 mg/dL (ref 1.7–2.4)

## 2021-07-09 MED ORDER — FENTANYL CITRATE (PF) 100 MCG/2ML IJ SOLN
25.0000 ug | INTRAMUSCULAR | Status: DC | PRN
Start: 2021-07-09 — End: 2021-07-13
  Administered 2021-07-09 (×2): 25 ug via INTRAVENOUS
  Filled 2021-07-09 (×2): qty 2

## 2021-07-09 MED ORDER — FLUTICASONE PROPIONATE 50 MCG/ACT NA SUSP
2.0000 | Freq: Every day | NASAL | Status: DC
  Administered 2021-07-09 – 2021-07-10 (×2): 2 via NASAL
  Filled 2021-07-09: qty 16

## 2021-07-09 MED ORDER — GUAIFENESIN ER 600 MG PO TB12
600.0000 mg | ORAL_TABLET | Freq: Two times a day (BID) | ORAL | Status: DC
  Administered 2021-07-09 – 2021-07-13 (×8): 600 mg via ORAL
  Filled 2021-07-09 (×8): qty 1

## 2021-07-09 MED ORDER — GABAPENTIN 400 MG PO CAPS
3000.0000 mg | ORAL_CAPSULE | Freq: Every day | ORAL | Status: DC
  Administered 2021-07-09 – 2021-07-12 (×4): 3000 mg via ORAL
  Filled 2021-07-09 (×4): qty 2

## 2021-07-09 NOTE — Progress Notes (Addendum)
Lunchtime BG is 149. Patient uses insulin pump.  Dinner BG is 98 on in personal insulin pump.

## 2021-07-09 NOTE — Progress Notes (Signed)
Inpatient Diabetes Program Recommendations  AACE/ADA: New Consensus Statement on Inpatient Glycemic Control (2015)  Target Ranges:  Prepandial:   less than 140 mg/dL      Peak postprandial:   less than 180 mg/dL (1-2 hours)      Critically ill patients:  140 - 180 mg/dL   Home DM Meds: Insulin Pump   Current Orders: Insulin Pump ac/hs/2am   CBGs taken by pt per home CBG meter (reviewed with pt this AM):  7/13 _0 - 85 mg/dl 7/14 _1 - 80 mg/dl 7/14 _2 - 100 mg/dl 7/14 _3 - 123 mg/dl 7/14 _4 :30am- 164 mg/dl--pt gave self 20 unit Bolus with Home Insulin Pump for CBG and Food Coverage    Met w/ pt at bedside this AM.  Pt remains A&O and able to independently manage insulin pump.  Started using her own CBG meter last PM per RN notes.  I reviewed the flowsheet pt is using in her room to keep up with her CBGs and insulin boluses (see above).  Will make sure MD aware that pt using own CBG meter and get order allowing pt to do so.  Will ask RNs to make sure to document all CBGs in the chart.  Pt changed set/site last PM with her daughter and daughter can bring more pump supplies as needed.  CBGs well controlled on pt's home insulin pump.    --Will follow patient during hospitalization--  Wyn Quaker RN, MSN, CDE Diabetes Coordinator Inpatient Glycemic Control Team Team Pager: [phone] (8a-5p)

## 2021-07-09 NOTE — Progress Notes (Signed)
PROGRESS NOTE    Brittney [name]  OYD:[id number] DOB: 1951-01-31 DOA: 07/07/2021 PCP: Rusty Aus, MD   Chief complaint.  Bilateral leg pain Brief Narrative:  Brittney [name] is a 70 y.o. female with medical history significant for diabetes on an insulin pump, morbid obesity and wheelchair dependent, surgical hypothyroidism, HTN, chronic venous stasis wounds currently on Bactrim, COPD on home O2 at 3 L, OSA on CPAP, CAD, on chronic narcotics for osteoarthritic pain, history of falls presenting following a fall in which she sustained an injury to the left ankle.  She is also concerned about worsening swelling in her bilateral lower extremities and a 20 pound weight gain in spite of increases in her diuretic medication doses. Patient was giving IV Lasix offered admission to the hospital, her BNP was normal. X-ray showed left ankle fracture, she also has right leg venous stasis ulcer, with the redness, warmth and tender to touch.  She is started on oral antibiotics with doxycycline and Augmentin for cellulitis     Assessment & Plan:   Active Problems:   Diabetes mellitus with peripheral vascular disease ([place])   Essential hypertension   Venous stasis of both lower extremities   OSA on CPAP   Fall at home, initial encounter   CHF (congestive heart failure) (HCC)   Chronic kidney disease, stage 3a (HCC)   Left fibular fracture   Left leg cellulitis  #1.  Bilateral lower extremity venous stasis with stasis ulcers. Left ankle fracture from fall. Fall at home. Right lower extremity cellulitis secondary to MRSA. Patient has been seen by Dr. Rudene Christians from orthopedics, patient has nondisplaced ankle fracture, no need for surgery. She has a mild right lower extremity cellulitis, will continue antibiotics. Wound care consult is pending.  #2.  Morbid obesity. Chronic hypoxemic respiratory failure. Obstructive sleep apnea. COPD Chronic diastolic congestive heart failure . Does not seem to  have any volume overload, continue home medicine.     DVT prophylaxis: lovenox Code Status: full Family Communication:  Disposition Plan:    Status is: Inpatient  Remains inpatient appropriate because:Unsafe d/c plan  Dispo: The patient is from: Home              Anticipated d/c is to: SNF              Patient currently is medically stable to d/c.   Difficult to place patient No        I/O last 3 completed shifts: In: 180 [P.O.:180] Out: 500 [Urine:500] No intake/output data recorded.     Consultants:  orthopedics  Procedures: none  Antimicrobials: Augmentin and doxycycline.  Subjective: Patient still complaining significant left ankle pain, bilateral leg pain. She is not on oxygen today, she do not feel short of breath.  She has dry cough which is nonproductive. No abdominal pain or nausea vomiting. No dysuria hematuria. No fever or chills  Objective: Vitals:   07/08/21 2117 07/09/21 0413 07/09/21 0500 07/09/21 0758  BP: (!) 128/43 (!) 132/49  (!) 140/58  Pulse: 61 64  68  Resp: 20 20  18   Temp: 98 F (36.7 C) 98.6 F (37 C)  97.6 F (36.4 C)  TempSrc: Oral Oral  Oral  SpO2: 92% 95%  90%  Weight:   (!) 153 kg   Height:        Intake/Output Summary (Last 24 hours) at 07/09/2021 1226 Last data filed at 07/09/2021 0411 Gross per 24 hour  Intake 180 ml  Output 500 ml  Net -320 ml   Filed Weights   07/07/21 1536 07/09/21 0500  Weight: (!) 148.3 kg (!) 153 kg    Examination:  General exam: Appears calm and comfortable, morbid obese Respiratory system: Clear to auscultation. Respiratory effort normal. Cardiovascular system: S1 & S2 heard, RRR. No JVD, murmurs, rubs, gallops or clicks. No pedal edema. Gastrointestinal system: Abdomen is nondistended, soft and nontender. No organomegaly or masses felt. Normal bowel sounds heard. Central nervous system: Alert and oriented. No focal neurological deficits. Extremities: Bilateral lower extremity  edema, left lower extremity hematoma.  Right leg still red, but improving. Skin: No rashes, lesions or ulcers Psychiatry: Judgement and insight appear normal. Mood & affect appropriate.     Data Reviewed: I have personally reviewed following labs and imaging studies  CBC: Recent Labs  Lab 07/07/21 1547 07/09/21 0429  WBC 6.4 5.5  NEUTROABS 3.7 2.4  HGB 10.7* 10.4*  HCT 32.7* 32.4*  MCV 91.6 93.4  PLT 177 643   Basic Metabolic Panel: Recent Labs  Lab 07/07/21 1547 07/09/21 0429  NA 135 138  K 4.4 4.2  CL 96* 98  CO2 36* 33*  GLUCOSE 180* 134*  BUN 35* 24*  CREATININE 1.15* 1.14*  CALCIUM 9.5 9.5  MG  --  2.0   GFR: Estimated Creatinine Clearance: 72.2 mL/min (A) (by C-G formula based on SCr of 1.14 mg/dL (H)). Liver Function Tests: Recent Labs  Lab 07/07/21 1547  AST 27  ALT 23  ALKPHOS 78  BILITOT 0.6  PROT 6.5  ALBUMIN 3.1*   No results for input(s): LIPASE, AMYLASE in the last 168 hours. No results for input(s): AMMONIA in the last 168 hours. Coagulation Profile: No results for input(s): INR, PROTIME in the last 168 hours. Cardiac Enzymes: No results for input(s): CKTOTAL, CKMB, CKMBINDEX, TROPONINI in the last 168 hours. BNP (last 3 results) No results for input(s): PROBNP in the last 8760 hours. HbA1C: No results for input(s): HGBA1C in the last 72 hours. CBG: Recent Labs  Lab 07/08/21 0542 07/08/21 0801 07/08/21 1325  GLUCAP 72 93 123*   Lipid Profile: No results for input(s): CHOL, HDL, LDLCALC, TRIG, CHOLHDL, LDLDIRECT in the last 72 hours. Thyroid Function Tests: No results for input(s): TSH, T4TOTAL, FREET4, T3FREE, THYROIDAB in the last 72 hours. Anemia Panel: No results for input(s): VITAMINB12, FOLATE, FERRITIN, TIBC, IRON, RETICCTPCT in the last 72 hours. Sepsis Labs: No results for input(s): PROCALCITON, LATICACIDVEN in the last 168 hours.  Recent Results (from the past 240 hour(s))  Aerobic Culture w Gram Stain (superficial  specimen)     Status: None   Collection Time: 06/30/21  4:18 PM   Specimen: Leg  Result Value Ref Range Status   Specimen Description   Final    LEG RIGHT LOWER LEG Performed at [facility], [address] [place], [place] [address]    Special Requests   Final    NONE Performed at [facility], [address] [place], [place] [address]    Gram Stain   Final    RARE WBC PRESENT,BOTH PMN AND MONONUCLEAR MODERATE GRAM POSITIVE COCCI    Culture   Final    ABUNDANT METHICILLIN RESISTANT STAPHYLOCOCCUS AUREUS WITHIN MIXED ORGANISMS Performed at [facility], [place] [address] [place], [place] [address]    Report Status 07/04/2021 FINAL  Final   Organism ID, Bacteria METHICILLIN RESISTANT STAPHYLOCOCCUS AUREUS  Final      Susceptibility   Methicillin resistant staphylococcus aureus - MIC*  CIPROFLOXACIN >=8 RESISTANT Resistant     ERYTHROMYCIN >=8 RESISTANT Resistant     GENTAMICIN <=0.5 SENSITIVE Sensitive     OXACILLIN >=4 RESISTANT Resistant     TETRACYCLINE <=1 SENSITIVE Sensitive     VANCOMYCIN 1 SENSITIVE Sensitive     TRIMETH/SULFA <=10 SENSITIVE Sensitive     CLINDAMYCIN >=8 RESISTANT Resistant     RIFAMPIN <=0.5 SENSITIVE Sensitive     Inducible Clindamycin NEGATIVE Sensitive     * ABUNDANT METHICILLIN RESISTANT STAPHYLOCOCCUS AUREUS  Resp Panel by RT-PCR (Flu A&B, Covid) Nasopharyngeal Swab     Status: None   Collection Time: 07/07/21  8:00 PM   Specimen: Nasopharyngeal Swab; Nasopharyngeal(NP) swabs in vial transport medium  Result Value Ref Range Status   SARS Coronavirus 2 by RT PCR NEGATIVE NEGATIVE Final    Comment: (NOTE) SARS-CoV-2 target nucleic acids are NOT DETECTED.  The SARS-CoV-2 RNA is generally detectable in upper respiratory specimens during the acute phase of infection. The lowest concentration of SARS-CoV-2 viral copies this assay can detect is 138 copies/mL. A negative result does not preclude SARS-Cov-2 infection and  should not be used as the sole basis for treatment or other patient management decisions. A negative result may occur with  improper specimen collection/handling, submission of specimen other than nasopharyngeal swab, presence of viral mutation(s) within the areas targeted by this assay, and inadequate number of viral copies(<138 copies/mL). A negative result must be combined with clinical observations, patient history, and epidemiological information. The expected result is Negative.  Fact Sheet for Patients:  EntrepreneurPulse.com.au  Fact Sheet for Healthcare Providers:  IncredibleEmployment.be  This test is no t yet approved or cleared by the [place] FDA and  has been authorized for detection and/or diagnosis of SARS-CoV-2 by FDA under an Emergency Use Authorization (EUA). This EUA will remain  in effect (meaning this test can be used) for the duration of the COVID-19 declaration under Section 564(b)(1) of the Act, 21 U.S.C.section 360bbb-3(b)(1), unless the authorization is terminated  or revoked sooner.       Influenza A by PCR NEGATIVE NEGATIVE Final   Influenza B by PCR NEGATIVE NEGATIVE Final    Comment: (NOTE) The Xpert Xpress SARS-CoV-2/FLU/RSV plus assay is intended as an aid in the diagnosis of influenza from Nasopharyngeal swab specimens and should not be used as a sole basis for treatment. Nasal washings and aspirates are unacceptable for Xpert Xpress SARS-CoV-2/FLU/RSV testing.  Fact Sheet for Patients: EntrepreneurPulse.com.au  Fact Sheet for Healthcare Providers: IncredibleEmployment.be  This test is not yet approved or cleared by the [place] FDA and has been authorized for detection and/or diagnosis of SARS-CoV-2 by FDA under an Emergency Use Authorization (EUA). This EUA will remain in effect (meaning this test can be used) for the duration of the COVID-19 declaration  under Section 564(b)(1) of the Act, 21 U.S.C. section 360bbb-3(b)(1), unless the authorization is terminated or revoked.  Performed at [facility], [address], [place], [place] [address]          Radiology Studies: DG Chest 2 View  Result Date: 07/07/2021 CLINICAL DATA:  Shortness of breath and history of recent fall EXAM: CHEST - 2 VIEW COMPARISON:  08/18/2020 FINDINGS: Cardiac shadow is enlarged. Vascular congestion is noted with mild interstitial edema new from the prior exam. Spinal stimulator is seen as well as postsurgical changes in the cervical spine. No acute bony abnormality is noted. No focal infiltrate is seen. IMPRESSION: Mild CHF. Electronically Signed   By: Elta Guadeloupe  Lukens M.D.   On: 07/07/2021 17:18   DG Tibia/Fibula Left  Result Date: 07/07/2021 CLINICAL DATA:  History of recent fall with left leg pain, initial encounter EXAM: LEFT TIBIA AND FIBULA - 2 VIEW COMPARISON:  None. FINDINGS: Left knee prosthesis is noted. There is an oblique fracture through the distal fibular metaphysis with only minimal displacement. Generalized soft tissue swelling is noted about the ankle. Mild cortical irregularity is noted in the distal tibial metaphysis. Undisplaced cortical fracture could not be totally excluded. Calcaneal spurring is noted. IMPRESSION: Undisplaced distal fibular fracture with associated soft tissue swelling. Findings suspicious for distal tibial fracture as described. Electronically Signed   By: Inez Catalina M.D.   On: 07/07/2021 17:20   DG Tibia/Fibula Right  Result Date: 07/07/2021 CLINICAL DATA:  Recent fall with right lower leg pain, initial encounter EXAM: RIGHT TIBIA AND FIBULA - 2 VIEW COMPARISON:  None. FINDINGS: Degenerative changes are noted about the knee joint. Calcaneal spurring is noted. No acute fracture or dislocation is seen. No gross soft tissue abnormality is noted. IMPRESSION: No acute fracture is seen. Electronically Signed   By: Inez Catalina M.D.   On: 07/07/2021 17:20   CT Head Wo Contrast  Result Date: 07/07/2021 CLINICAL DATA:  Head trauma, mental status changes of unknown cause. EXAM: CT HEAD WITHOUT CONTRAST TECHNIQUE: Contiguous axial images were obtained from the base of the skull through the vertex without intravenous contrast. COMPARISON:  Prior studies from August of 2021 in July of 2013 FINDINGS: Brain: No evidence of acute infarction, hemorrhage, hydrocephalus, extra-axial collection or mass lesion/mass effect. Signs of generalized atrophy as before. Areas of parenchymal calcification also similar to prior imaging. Unchanged enlargement of LEFT sylvian fissure without mass effect. 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 Vascular: No hyperdense vessel or unexpected calcification. Skull: Normal. Negative for fracture or focal lesion. Sinuses/Orbits: Visualized paranasal sinuses and orbits are unremarkable. Other: None. IMPRESSION: 1. No acute intracranial pathology. 2. Signs of generalized atrophy and sylvian fissure enlargement as well as prior infarcts similar to the previous CT evaluation. Electronically Signed   By: Zetta Bills M.D.   On: 07/07/2021 19:44   DG Foot Complete Left  Result Date: 07/07/2021 CLINICAL DATA:  Recent fall with left foot pain, initial EXAM: LEFT FOOT - COMPLETE 3+ VIEW COMPARISON:  None. FINDINGS: Undisplaced distal fibular fracture is again seen with soft tissue swelling. Considerable soft tissue swelling is noted in the foot. No other fracture is seen. IMPRESSION: Soft tissue swelling. Distal fibular fracture is again noted. Electronically Signed   By: Inez Catalina M.D.   On: 07/07/2021 17:23   DG Foot Complete Right  Result Date: 07/07/2021 CLINICAL DATA:  Recent fall with right foot pain, initial encounter EXAM: RIGHT FOOT COMPLETE - 3+ VIEW COMPARISON:  None. FINDINGS: Examination is somewhat limited due to overlying extrinsic artifact. Considerable soft tissue swelling  is noted about the metatarsals dorsally. Calcaneal spurring is seen. Irregularity at the base of the first distal phalanx which may represent an undisplaced fracture. Correlate to point tenderness. No other focal abnormality is noted. Healed fibular fracture is noted distally. IMPRESSION: Question fracture of the first distal phalanx as described. Healed distal right fibular fracture Electronically Signed   By: Inez Catalina M.D.   On: 07/07/2021 17:22   ECHOCARDIOGRAM COMPLETE  Result Date: 07/08/2021    ECHOCARDIOGRAM REPORT   Patient Name:   Brittney [name] Date of Exam: 07/08/2021 Medical Rec #:  [id number]  Height:       66.0 in Accession #:    [id number]       Weight:       327.0 lb Date of Birth:  May 31, 1951         BSA:          2.465 m Patient Age:    59 years         BP:           132/57 mmHg Patient Gender: F                HR:           72 bpm. Exam Location:  [facility] Procedure: 2D Echo, Cardiac Doppler and Color Doppler Indications:     CHF-acute diastolic A41.66  History:         Patient has prior history of Echocardiogram examinations, most                  recent 08/18/2020. Risk Factors:Diabetes and Hypertension.  Sonographer:     Sherrie Sport RDCS (AE) Referring Phys:  [id number] Athena Masse Diagnosing Phys: Bartholome Bill MD  Sonographer Comments: Technically challenging study due to limited acoustic windows and no apical window. IMPRESSIONS  1. Left ventricular ejection fraction, by estimation, is 65 to 70%. The left ventricle has normal function. The left ventricle has no regional wall motion abnormalities. Left ventricular diastolic parameters were normal.  2. Right ventricular systolic function is normal. The right ventricular size is mildly enlarged.  3. The mitral valve was not well visualized. Mild mitral valve regurgitation.  4. The aortic valve was not well visualized. Aortic valve regurgitation is trivial. FINDINGS  Left Ventricle: Left ventricular ejection fraction, by estimation, is 65  to 70%. The left ventricle has normal function. The left ventricle has no regional wall motion abnormalities. The left ventricular internal cavity size was normal in size. There is  borderline left ventricular hypertrophy. Left ventricular diastolic parameters were normal. Right Ventricle: The right ventricular size is mildly enlarged. No increase in right ventricular wall thickness. Right ventricular systolic function is normal. Left Atrium: Left atrial size was normal in size. Right Atrium: Right atrial size was normal in size. Pericardium: There is no evidence of pericardial effusion. Mitral Valve: The mitral valve was not well visualized. Mild mitral valve regurgitation. Tricuspid Valve: The tricuspid valve is not well visualized. Tricuspid valve regurgitation is trivial. Aortic Valve: The aortic valve was not well visualized. Aortic valve regurgitation is trivial. Pulmonic Valve: The pulmonic valve was not assessed. Pulmonic valve regurgitation is not visualized. Aorta: The aortic root was not well visualized. IAS/Shunts: The interatrial septum was not well visualized.  LEFT VENTRICLE PLAX 2D LVIDd:         4.89 cm LVIDs:         3.25 cm LV PW:         1.61 cm LV IVS:        1.38 cm LVOT diam:     2.00 cm LVOT Area:     3.14 cm  LEFT ATRIUM         Index LA diam:    4.00 cm 1.62 cm/m                        PULMONIC VALVE AORTA                 PV Vmax:        0.73 m/s  Ao Root diam: 3.40 cm PV Peak grad:   2.1 mmHg                       RVOT Peak grad: 2 mmHg   SHUNTS Systemic Diam: 2.00 cm Bartholome Bill MD Electronically signed by Bartholome Bill MD Signature Date/Time: 07/08/2021/3:50:42 PM    Final         Scheduled Meds:  amoxicillin-clavulanate  1 tablet Oral Q12H   aspirin EC  81 mg Oral Daily   doxycycline  100 mg Oral Q12H   enoxaparin (LOVENOX) injection  75 mg Subcutaneous Q24H   escitalopram  10 mg Oral Daily   fluticasone  2 spray Each Nare Daily   gabapentin  3,000 mg Oral QHS   insulin  pump   Subcutaneous TID WC, HS, 0200   irbesartan  75 mg Oral Daily   levothyroxine  200 mcg Oral QAC breakfast   magnesium oxide  400 mg Oral Daily   metolazone  5 mg Oral Once per day on Mon Thu   pantoprazole  40 mg Oral BID   pramipexole  1 mg Oral QHS   traZODone  150 mg Oral QHS   zolpidem  5 mg Oral QHS   Continuous Infusions:   LOS: 1 day    Time spent: 27 minutes    Sharen Hones, MD [facility]   To contact the attending provider between 7A-7P or the covering provider during after hours 7P-7A, please log into the web site [email or web address] and access using universal [place] password for that web site. If you do not have the password, please call the hospital operator.  07/09/2021, 12:26 PM

## 2021-07-09 NOTE — Evaluation (Signed)
Occupational Therapy Evaluation Patient Details Name: Brittney [name] MRN: [id number] DOB: 1951/04/01 Today's Date: 07/09/2021    History of Present Illness Pt is a 70 yo female that presented to the ED after a fall at home. 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   Clinical Impression   [name] presents today with generalized weakness and 8/10 L LE pain, significantly limiting her ability to engage in fxl mobility. She reports 6 falls in the past 2 weeks, as well as "numerous" falls in the previous 12 months, occurring when she is transferring to/from her WC. She has assistance at home 3 hrs/day, 5 days/week, but reports that her aide is unable to provide pt with physical assistance. At this point, pt is unable to transfer to/from bed w/o a hoyer lift; she is unable to move her L LE in any plane. Her L LE has been her "stronger" leg and the one she relies on most for transfers. Recommend ongoing OT while hospitalized. To return to home, pt would require a hoyer lift + 24 hr care. Without this in place, recommend DC to SNF to improve endurance and strength, manage pain, and reduce falls risk, so that pt can return home at [facility].    Follow Up Recommendations  SNF    Equipment Recommendations  None recommended by OT    Recommendations for Other Services       Precautions / Restrictions Precautions Precautions: Fall Restrictions Weight Bearing Restrictions: Yes LLE Weight Bearing: Touchdown weight bearing      Mobility Bed Mobility Overal bed mobility: Needs Assistance Bed Mobility: Rolling Rolling: Mod assist         General bed mobility comments: pt unable to move sit<>supine, 2/2 pain    Transfers Overall  transfer level: Needs assistance               General transfer comment: deferred    Balance Overall balance assessment: Needs assistance;History of Falls     Sitting balance - Comments: pt declines bed mobility     Standing balance-Leahy Scale: Zero                             ADL either performed or assessed with clinical judgement   ADL Overall ADL's : Needs assistance/impaired Eating/Feeding: Independent   Grooming: Independent;Set up                                       Vision Patient Visual Report: No change from baseline       Perception     Praxis      Pertinent Vitals/Pain Pain Score: 8  Pain Location: L leg, ankle Pain Descriptors / Indicators: Sharp;Shooting;Guarding;Moaning;Grimacing;Crying Pain Intervention(s): Limited activity within patient's tolerance;Monitored during session;Premedicated before session;Patient requesting pain meds-RN notified     Hand Dominance Left   Extremity/Trunk Assessment Upper Extremity Assessment Upper Extremity Assessment: Overall WFL for tasks assessed   Lower Extremity Assessment Lower Extremity Assessment: LLE deficits/detail;RLE deficits/detail RLE Coordination: decreased gross motor LLE: Unable to fully assess due to pain;Unable to fully assess due to immobilization LLE Coordination: decreased gross motor;decreased fine  motor       Communication Communication Communication: No difficulties   Cognition Arousal/Alertness: Awake/alert Behavior During Therapy: WFL for tasks assessed/performed Overall Cognitive Status: Within Functional Limits for tasks assessed                                     General Comments  ulcerations, hematomas, edema in b/l LE    Exercises Other Exercises Other Exercises: b/l UE therex from supine. Unable to participate in LE exercise due to pain, able to slightly bend R knee, unable to bend/move L LE in any plane   Shoulder  Instructions      Home Living Family/patient expects to be discharged to:: Private residence Living Arrangements: Alone Available Help at Discharge: Home health;Family;Available PRN/intermittently Type of Home: Mobile home Home Access: Ramped entrance     Home Layout: One level     Bathroom Shower/Tub: Teacher, early years/pre: Standard     Home Equipment: Environmental consultant - 2 wheels;Tub bench;Walker - standard;Wheelchair - power;Wheelchair - manual;Toilet riser;Bedside commode          Prior Functioning/Environment Level of Independence: Needs assistance  Gait / Transfers Assistance Needed: uses power WC for mobility, able to stand/pivot for transfers ADL's / Homemaking Assistance Needed: set up for ADLs if needed; meals on wheels, aide assist with homemaking duties   Comments: Has attendent 3 hrs/day, 5 days/wk, assisting with ADLs and IADLs        OT Problem List: Decreased strength;Increased edema;Decreased range of motion;Decreased activity tolerance;Impaired balance (sitting and/or standing);Pain      OT Treatment/Interventions: Self-care/ADL training;Therapeutic exercise;Patient/family education;Balance training;Therapeutic activities    OT Goals(Current goals can be found in the care plan section) Acute Rehab OT Goals Patient Stated Goal: to get back to normal OT Goal Formulation: With patient Time For Goal Achievement: 07/23/21 Potential to Achieve Goals: Good ADL Goals Pt Will Perform Lower Body Bathing: sitting/lateral leans;with min assist Pt Will Transfer to Toilet: with min assist;bedside commode;stand pivot transfer Pt Will Perform Tub/Shower Transfer: with min assist;tub bench  OT Frequency: Min 1X/week   Barriers to D/C: Decreased caregiver support          Co-evaluation              AM-PAC OT "6 Clicks" Daily Activity     Outcome Measure Help from another person eating meals?: None Help from another person taking care of personal  grooming?: None Help from another person toileting, which includes using toliet, bedpan, or urinal?: Total Help from another person bathing (including washing, rinsing, drying)?: A Lot Help from another person to put on and taking off regular upper body clothing?: A Little Help from another person to put on and taking off regular lower body clothing?: Total 6 Click Score: 15   End of Session Nurse Communication: Patient requests pain meds  Activity Tolerance: Patient limited by pain;Patient limited by fatigue Patient left: in bed;with call bell/phone within reach;with bed alarm set  OT Visit Diagnosis: Unsteadiness on feet (R26.81);Other abnormalities of gait and mobility (R26.89);Repeated falls (R29.6);Muscle weakness (generalized) (M62.81);Pain Pain - Right/Left: Left Pain - part of body: Leg                Time: 2355-7322 OT Time Calculation (min): 24 min Charges:  OT General Charges $OT Visit: 1 Visit OT Evaluation $OT Eval Moderate Complexity: 1 Mod OT Treatments $Self Care/Home Management : 23-37 mins  Josiah Lobo, PhD, MS, OTR/L 07/09/21, 10:49 AM

## 2021-07-10 ENCOUNTER — Ambulatory Visit: Payer: Medicare Other | Admitting: Physician Assistant

## 2021-07-10 ENCOUNTER — Inpatient Hospital Stay: Payer: Medicare Other

## 2021-07-10 DIAGNOSIS — I5032 Chronic diastolic (congestive) heart failure: Secondary | ICD-10-CM | POA: Diagnosis not present

## 2021-07-10 DIAGNOSIS — L03115 Cellulitis of right lower limb: Secondary | ICD-10-CM | POA: Diagnosis not present

## 2021-07-10 DIAGNOSIS — S82832A Other fracture of upper and lower end of left fibula, initial encounter for closed fracture: Secondary | ICD-10-CM | POA: Diagnosis not present

## 2021-07-10 MED ORDER — LINACLOTIDE 145 MCG PO CAPS
145.0000 ug | ORAL_CAPSULE | Freq: Every day | ORAL | Status: DC
  Administered 2021-07-10 – 2021-07-13 (×4): 145 ug via ORAL
  Filled 2021-07-10 (×5): qty 1

## 2021-07-10 MED ORDER — ALPRAZOLAM 0.5 MG PO TABS
0.5000 mg | ORAL_TABLET | Freq: Three times a day (TID) | ORAL | Status: DC | PRN
  Administered 2021-07-10 – 2021-07-13 (×6): 0.5 mg via ORAL
  Filled 2021-07-10 (×6): qty 1

## 2021-07-10 MED ORDER — CARVEDILOL 25 MG PO TABS
25.0000 mg | ORAL_TABLET | Freq: Two times a day (BID) | ORAL | Status: DC
  Administered 2021-07-10 – 2021-07-13 (×6): 25 mg via ORAL
  Filled 2021-07-10 (×6): qty 1

## 2021-07-10 NOTE — Progress Notes (Signed)
Occupational Therapy Treatment Patient Details Name: Brittney [name] MRN: [id number] DOB: [birth year] Today's Date: 07/10/2021    History of present illness Pt is a 70 yo female that presented to the ED after a fall at home. 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   OT comments  Brittney [name] was seen for OT treatment on this date. Upon initial arrival to room pt receiving pain medication, requesting to hold session until late morning for optimal pain control. Upon re-attempt pt pleasant and agreeabel to session. Pt requires MAX A x2 for sup<>sit. Tolerated seated BLE therex as described below. Initial CGA improving to SUPERVISION dynamic tasks sitting EOB - single UE to no UE support for grooming and self-feeding. MAX A don B socks at bed level. Pt making good progress toward goals. Pt continues to benefit from skilled OT services to maximize return to PLOF and minimize risk of future falls, injury, caregiver burden, and readmission. Will continue to follow POC. Discharge recommendation remains appropriate.    Follow Up Recommendations  SNF    Equipment Recommendations  None recommended by OT    Recommendations for Other Services      Precautions / Restrictions Precautions Precautions: Fall Restrictions Weight Bearing Restrictions: Yes LLE Weight Bearing: Touchdown weight bearing       Mobility Bed Mobility Overal bed mobility: Needs Assistance Bed Mobility: Supine to Sit;Sit to Supine     Supine to sit: Max assist;+2 for physical assistance Sit to supine: Max assist;+2 for physical assistance        Transfers                      Balance Overall balance assessment: Needs assistance Sitting-balance  support: No upper extremity supported;Feet supported Sitting balance-Leahy Scale: Fair                                     ADL either performed or assessed with clinical judgement   ADL Overall ADL's : Needs assistance/impaired                                       General ADL Comments: Initial CGA improving to SUPERVISION dynamic tasks sitting EOB - single UE to no UE support. MAX A don B socks at bed level.      Cognition Arousal/Alertness: Awake/alert Behavior During Therapy: WFL for tasks assessed/performed Overall Cognitive Status: Within Functional Limits for tasks assessed                                          Exercises Exercises: Other exercises;General Lower Extremity General Exercises - Lower Extremity Quad Sets: AROM;Strengthening;Both;10 reps;Seated Gluteal Sets: AROM;Strengthening;Both;10 reps;Seated Long Arc Quad: AROM;Strengthening;Both;10 reps;Seated Hip Flexion/Marching: AROM;Strengthening;Both;10 reps;Seated Other Exercises Other Exercises: Pt educated re: OT role, DME recs, d/c recs, falls prevention, HEP Other Exercises: LBD, self-feeding, grooming, sup<>sit, siftting balance/tolerance   Pertinent Vitals/ Pain       Pain Assessment: Faces Faces Pain Scale: Hurts even  more Pain Location: L leg, ankle Pain Descriptors / Indicators: Sharp;Shooting;Guarding;Grimacing;Crying Pain Intervention(s): Limited activity within patient's tolerance;Premedicated before session;Repositioned         Frequency  Min 1X/week        Progress Toward Goals  OT Goals(current goals can now be found in the care plan section)  Progress towards OT goals: Progressing toward goals  Acute Rehab OT Goals Patient Stated Goal: to get back to normal OT Goal Formulation: With patient Time For Goal Achievement: 07/23/21 Potential to Achieve Goals: Good ADL Goals Pt Will Perform Lower Body Bathing: sitting/lateral leans;with  min assist Pt Will Transfer to Toilet: with min assist;bedside commode;stand pivot transfer Pt Will Perform Tub/Shower Transfer: with min assist;tub bench  Plan Discharge plan remains appropriate;Frequency remains appropriate       AM-PAC OT "6 Clicks" Daily Activity     Outcome Measure   Help from another person eating meals?: A Little Help from another person taking care of personal grooming?: A Little Help from another person toileting, which includes using toliet, bedpan, or urinal?: A Lot Help from another person bathing (including washing, rinsing, drying)?: A Lot Help from another person to put on and taking off regular upper body clothing?: A Little Help from another person to put on and taking off regular lower body clothing?: A Lot 6 Click Score: 15    End of Session    OT Visit Diagnosis: Unsteadiness on feet (R26.81);Other abnormalities of gait and mobility (R26.89);Repeated falls (R29.6);Muscle weakness (generalized) (M62.81);Pain Pain - Right/Left: Left Pain - part of body: Leg   Activity Tolerance Patient tolerated treatment well   Patient Left in bed;with call bell/phone within reach;with bed alarm set   Nurse Communication Mobility status        Time: 0354-6568 OT Time Calculation (min): 41 min  Charges: OT General Charges $OT Visit: 1 Visit OT Treatments $Self Care/Home Management : 23-37 mins $Therapeutic Exercise: 8-22 mins   Dessie Coma, M.S. OTR/L  07/10/21, 2:37 PM  ascom [phone]

## 2021-07-10 NOTE — TOC Initial Note (Signed)
Transition of Care [facility]) - Initial/Assessment Note    Patient Details  Name: Brittney [name] MRN: [id number] Date of Birth: [birth year]  [facility]) CM/SW Contact:    Kerin Salen, RN Phone Number: 07/10/2021, 1:08 PM  Clinical Narrative:  Spoke with patient and daughter, [name] who was on speaker phone. Patient lives alone in a single family home with [facility] that asssist with ADL's Monday-Friday, 3hrs daily. HHRN twice a week. Patient is non-ambulatory uses wheelchair. Daughter provides shopping, picks up medications from the [facility] in [place], transportation to medical visits. Patient able to use microwave for meals. Patient with bilateral LE Wounds, and left ankle fracture due to a fall. Discussed SNF for rehabilitation, both agreed however did not prefer [facility] nor [facility]. Will began paperwork and keep patient and daughter informed of acceptances. Daughter given my contact information if any questions or concerns.               Expected Discharge Plan: Skilled Nursing Facility Barriers to Discharge: Continued Medical Work up   Patient Goals and CMS Choice Patient states their goals for this hospitalization and ongoing recovery are:: To go to SNF per PT Recommendations. CMS Medicare.gov Compare Post Acute Care list provided to:: Patient Choice offered to / list presented to : Adult Children  Expected Discharge Plan and Services Expected Discharge Plan: [place] In-house Referral: Clinical Social Work   Post Acute Care Choice: [place] Living arrangements for the past 2 months: [place]                                      Prior Living Arrangements/Services Living arrangements for the past 2 months: Single Family Home Lives with:: Self Patient language and need for interpreter reviewed:: Yes Do you feel safe going back to the place where you live?: Yes      Need for Family Participation in Patient  Care: Yes (Comment) Care giver support system in place?: Yes (comment) Current home services: Homehealth aide, Home RN Criminal Activity/Legal Involvement Pertinent to Current Situation/Hospitalization: No - Comment as needed  Activities of Daily Living Home Assistive Devices/Equipment: Insulin Pump, Shower chair with back, Wheelchair ADL Screening (condition at time of admission) Patient's cognitive ability adequate to safely complete daily activities?: No Is the patient deaf or have difficulty hearing?: No Does the patient have difficulty seeing, even when wearing glasses/contacts?: No Does the patient have difficulty concentrating, remembering, or making decisions?: No Patient able to express need for assistance with ADLs?: Yes Does the patient have difficulty dressing or bathing?: Yes Independently performs ADLs?: No Walks in Home: Dependent Is this a change from baseline?: Pre-admission baseline Does the patient have difficulty walking or climbing stairs?: Yes Weakness of Legs: Both Weakness of Arms/Hands: None  Permission Sought/Granted Permission sought to share information with : Case Manager Permission granted to share information with : Yes, Verbal Permission Granted  Share Information with NAME: Wendie Chess     Permission granted to share info w Relationship: Daughter  Permission granted to share info w Contact Information: [phone]  Emotional Assessment Appearance:: Appears stated age Attitude/Demeanor/Rapport: Engaged Affect (typically observed): Accepting, Appropriate Orientation: : Oriented to Self, Oriented to  Time, Oriented to Place, Oriented to Situation Alcohol / Substance Use: Not Applicable Psych Involvement: No (comment)  Admission diagnosis:  CHF (congestive heart failure) (HCC) [I50.9] Generalized edema [R60.1] Left leg  cellulitis [J49.702] Other closed fracture of distal end of left fibula, initial encounter [S82.832A] Acute on chronic congestive  heart failure, unspecified heart failure type [facility]) [I50.9] Patient Active Problem List   Diagnosis Date Noted   Cellulitis of right leg 07/09/2021   Left leg cellulitis 07/08/2021   CHF (congestive heart failure) ([place]) 07/07/2021   Chronic kidney disease, stage 3a ([place]) 07/07/2021   Left fibular fracture 07/07/2021   Bilateral lower leg cellulitis 08/18/2020   Rhabdomyolysis 08/18/2020   Fall at home, initial encounter 08/18/2020   Fall 08/18/2020   Primary osteoarthritis of left knee 11/26/2019   Sepsis ([place]) 07/30/2019   Type 2 diabetes mellitus with diabetic polyneuropathy, with long-term current use of insulin ([place]) 07/03/2019   OSA on CPAP 01/24/2019   Lymphedema 09/03/2018   Venous stasis of both lower extremities 09/03/2018   Status post lumbar spine surgery for decompression of spinal cord 03/30/2018   Myofascial pain 03/30/2018   Spinal stenosis of thoracic region 03/30/2018   Back pain 11/02/2017   Iron deficiency anemia 05/10/2017   Benign essential hypertension 03/10/2017   Long term prescription benzodiazepine use 01/18/2017   Vitamin D insufficiency 01/18/2017   Neurogenic pain 01/17/2017   L3-4 severe lumbar facet hypertrophy and spinal stenosis 01/10/2017   Diabetes with retinopathy ([place]) 12/07/2016   Diabetic nephropathy ([place]) 12/07/2016   Gastroparesis due to DM ([place]) 12/07/2016   Long term current use of opiate analgesic 12/07/2016   Long term prescription opiate use 12/07/2016   Opiate use 12/07/2016   Chronic pain syndrome 12/07/2016   Chronic low back pain (Location of Primary Source of Pain) (Bilateral) (R>L) 12/07/2016   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/07/2016   Chronic lower extremity pain (Location of Secondary source of pain) (Bilateral) (R>L) 12/07/2016   Chronic knee pain (Location of Tertiary source of pain) (Right) 12/07/2016   Osteoarthritis of knee (Right) 12/07/2016   Grade 1 Anterolisthesis of L3 over L4 and L4 over L5  12/07/2016   Osteoarthritis of sacroiliac joint (Right) 12/07/2016   Generalized osteoarthritis of multiple sites 10/22/2016   Degenerative spondylolisthesis 09/08/2016   L3-4 severe lumbar spinal stenosis (12/31/2016 MRI) 09/08/2016   Lung nodule, multiple 08/31/2016   Hepatic cirrhosis ([place]) 06/20/2016   Fibromyalgia 04/15/2016   Seronegative arthritis 04/15/2016   Diabetes mellitus with peripheral vascular disease ([place]) 03/16/2016   Type 2 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, with long-term current use of insulin ([place]) 03/16/2016   Morbid obesity with BMI of 50.0-59.9, adult ([place]) 03/16/2016   Type 2 diabetes mellitus with diabetic nephropathy, with long-term current use of insulin ([place]) 03/16/2016   Microalbuminuria 02/20/2016   Essential hypertension 01/27/2016   DM (diabetes mellitus) type II uncontrolled, periph vascular disorder ([place]) 04/26/2014   Hyperlipemia 04/26/2014   OSA (obstructive sleep apnea) 04/26/2014   Abnormal tumor markers 12/11/2012   Chronic kidney disease, stage II (mild) 12/11/2012   Unilateral small kidney 12/11/2012   PCP:  Rusty Aus, MD Pharmacy:   [id number] [place] [place] - [address] [address] Phone: [phone] Fax: [phone]     Social Determinants of Health (SDOH) Interventions    Readmission Risk Interventions Readmission Risk Prevention Plan 07/10/2021  Transportation Screening Complete  PCP or Specialist Appt within 3-5 Days Complete  HRI or [place] Complete  Social Work Consult for [place] Planning/Counseling Complete  Palliative Care Screening Not Applicable  Medication  Review Press photographer) Complete  Some recent data might be hidden

## 2021-07-10 NOTE — Care Management Important Message (Signed)
Important Message  Patient Details  Name: Brittney [name] MRN: [id number] Date of Birth: 08-Feb-1951   Medicare Important Message Given:  N/A - LOS <3 / Initial given by admissions  Initial Medicare IM reviewed with patient by Thornton Dales, Patient Access Associate on 07/09/2021 at 8:55am.    Dannette Barbara 07/10/2021, 8:37 AM

## 2021-07-10 NOTE — Progress Notes (Signed)
Follow up ankle xray ordered for Sunday, will need xrays about every week and a hlf for 6 weeks to make sure it does not displace

## 2021-07-10 NOTE — Progress Notes (Addendum)
Lunchtime CBG 177, patient self-administered 3 units of insulin from home.  Dinnertime CBG 137, patient self-administered 0 units.

## 2021-07-10 NOTE — Progress Notes (Signed)
PT Cancellation Note  Patient Details Name: Brittney [name] MRN: [id number] DOB: Dec 17, 1951   Cancelled Treatment:     PT attempt. PT hold. Pt unwilling to participate at this time." I already had therapy today."(Referring to OT) Tried to encourage EOB/exercises however pt unwilling. " I will do it tomorrow." Acute PT will continue to follow and progress per current POC.   Willette Pa 07/10/2021, 4:19 PM

## 2021-07-10 NOTE — Progress Notes (Signed)
PROGRESS NOTE    Brittney [name]  WIO:[id number] DOB: August 27, 1951 DOA: 07/07/2021 PCP: Rusty Aus, MD   Chief complaint.  Bilateral leg pain. Brief Narrative:  Brittney [name] is a 70 y.o. female with medical history significant for diabetes on an insulin pump, morbid obesity and wheelchair dependent, surgical hypothyroidism, HTN, chronic venous stasis wounds currently on Bactrim, COPD on home O2 at 3 L, OSA on CPAP, CAD, on chronic narcotics for osteoarthritic pain, history of falls presenting following a fall in which she sustained an injury to the left ankle.  She is also concerned about worsening swelling in her bilateral lower extremities and a 20 pound weight gain in spite of increases in her diuretic medication doses. Patient was giving IV Lasix offered admission to the hospital, her BNP was normal. X-ray showed left ankle fracture, she also has right leg venous stasis ulcer, with the redness, warmth and tender to touch.  She is started on oral antibiotics with doxycycline and Augmentin for cellulitis   Assessment & Plan:   Active Problems:   Diabetes mellitus with peripheral vascular disease ([place])   Essential hypertension   Venous stasis of both lower extremities   OSA on CPAP   Fall at home, initial encounter   CHF (congestive heart failure) (HCC)   Chronic kidney disease, stage 3a (HCC)   Left fibular fracture   Left leg cellulitis   Cellulitis of right leg  #1.  Bilateral lower extremity venous stasis with stasis ulcers. Left ankle fracture from fall. Fall at home. Right lower extremity cellulitis secondary to MRSA. Patient condition improving, right leg cellulitis also improving, I will continue 3 days antibiotics then discontinue.  #2.  Morbid obesity. Chronic hypoxemic respiratory failure. Obstructive sleep apnea. COPD Chronic diastolic congestive heart failure . Conditions are stable.  DVT prophylaxis: Lovenox Code Status: full Family Communication:   Disposition Plan:    Status is: Inpatient  Remains inpatient appropriate because:Unsafe d/c plan  Dispo: The patient is from: Home              Anticipated d/c is to: SNF              Patient currently is medically stable to d/c.   Difficult to place patient No        I/O last 3 completed shifts: In: 180 [P.O.:180] Out: 8 [Urine:2750] Total I/O In: -  Out: 800 [Urine:800]     Consultants:  Orthopedics, cardiology  Procedures: None  Antimicrobials:  Augmentin and doxycycline.  Subjective: Patient complaining of nausea, but no vomiting.  This is a chronic for about 5 years.  No diarrhea constipation. Denies any short of breath or cough. No fever chills pain No dysuria hematuria. To complain bilateral leg pain, but gradually improving.  Objective: Vitals:   07/09/21 1530 07/09/21 1955 07/10/21 0351 07/10/21 0900  BP: (!) 167/61 (!) 177/60 (!) 174/75 (!) 153/68  Pulse: 70 74 78 77  Resp: 18 16 18 18   Temp: 98.4 F (36.9 C) 98.1 F (36.7 C) 98.3 F (36.8 C) 97.7 F (36.5 C)  TempSrc: Oral Oral Oral   SpO2: 97% 92% 92% 92%  Weight: (!) 152.3 kg     Height:        Intake/Output Summary (Last 24 hours) at 07/10/2021 1338 Last data filed at 07/10/2021 1000 Gross per 24 hour  Intake --  Output 3050 ml  Net -3050 ml   Filed Weights   07/07/21 1536 07/09/21 0500 07/09/21 1530  Weight: Marland Kitchen)  148.3 kg (!) 153 kg (!) 152.3 kg    Examination:  General exam: Appears calm and comfortable  Respiratory system: Clear to auscultation. Respiratory effort normal. Cardiovascular system: S1 & S2 heard, RRR. No JVD, murmurs, rubs, gallops or clicks.  Gastrointestinal system: Abdomen is nondistended, soft and nontender. No organomegaly or masses felt. Normal bowel sounds heard. Central nervous system: Alert and oriented. No focal neurological deficits. Extremities: Leg chronic edema.  Right leg redness much improved. Skin: No rashes, lesions or ulcers Psychiatry:  Judgement and insight appear normal. Mood & affect appropriate.     Data Reviewed: I have personally reviewed following labs and imaging studies  CBC: Recent Labs  Lab 07/07/21 1547 07/09/21 0429  WBC 6.4 5.5  NEUTROABS 3.7 2.4  HGB 10.7* 10.4*  HCT 32.7* 32.4*  MCV 91.6 93.4  PLT 177 643   Basic Metabolic Panel: Recent Labs  Lab 07/07/21 1547 07/09/21 0429  NA 135 138  K 4.4 4.2  CL 96* 98  CO2 36* 33*  GLUCOSE 180* 134*  BUN 35* 24*  CREATININE 1.15* 1.14*  CALCIUM 9.5 9.5  MG  --  2.0   GFR: Estimated Creatinine Clearance: 72 mL/min (A) (by C-G formula based on SCr of 1.14 mg/dL (H)). Liver Function Tests: Recent Labs  Lab 07/07/21 1547  AST 27  ALT 23  ALKPHOS 78  BILITOT 0.6  PROT 6.5  ALBUMIN 3.1*   No results for input(s): LIPASE, AMYLASE in the last 168 hours. No results for input(s): AMMONIA in the last 168 hours. Coagulation Profile: No results for input(s): INR, PROTIME in the last 168 hours. Cardiac Enzymes: No results for input(s): CKTOTAL, CKMB, CKMBINDEX, TROPONINI in the last 168 hours. BNP (last 3 results) No results for input(s): PROBNP in the last 8760 hours. HbA1C: No results for input(s): HGBA1C in the last 72 hours. CBG: Recent Labs  Lab 07/08/21 0542 07/08/21 0801 07/08/21 1325  GLUCAP 72 93 123*   Lipid Profile: No results for input(s): CHOL, HDL, LDLCALC, TRIG, CHOLHDL, LDLDIRECT in the last 72 hours. Thyroid Function Tests: No results for input(s): TSH, T4TOTAL, FREET4, T3FREE, THYROIDAB in the last 72 hours. Anemia Panel: No results for input(s): VITAMINB12, FOLATE, FERRITIN, TIBC, IRON, RETICCTPCT in the last 72 hours. Sepsis Labs: No results for input(s): PROCALCITON, LATICACIDVEN in the last 168 hours.  Recent Results (from the past 240 hour(s))  Aerobic Culture w Gram Stain (superficial specimen)     Status: None   Collection Time: 06/30/21  4:18 PM   Specimen: Leg  Result Value Ref Range Status   Specimen  Description   Final    LEG RIGHT LOWER LEG Performed at [facility], [address], [place], [place] [address]    Special Requests   Final    NONE Performed at [facility], [address] [place], [place] [address]    Gram Stain   Final    RARE WBC PRESENT,BOTH PMN AND MONONUCLEAR MODERATE GRAM POSITIVE COCCI    Culture   Final    ABUNDANT METHICILLIN RESISTANT STAPHYLOCOCCUS AUREUS WITHIN MIXED ORGANISMS Performed at [facility], [place] [address], [place], [place] [address]    Report Status 07/04/2021 FINAL  Final   Organism ID, Bacteria METHICILLIN RESISTANT STAPHYLOCOCCUS AUREUS  Final      Susceptibility   Methicillin resistant staphylococcus aureus - MIC*    CIPROFLOXACIN >=8 RESISTANT Resistant     ERYTHROMYCIN >=8 RESISTANT Resistant     GENTAMICIN <=0.5 SENSITIVE  Sensitive     OXACILLIN >=4 RESISTANT Resistant     TETRACYCLINE <=1 SENSITIVE Sensitive     VANCOMYCIN 1 SENSITIVE Sensitive     TRIMETH/SULFA <=10 SENSITIVE Sensitive     CLINDAMYCIN >=8 RESISTANT Resistant     RIFAMPIN <=0.5 SENSITIVE Sensitive     Inducible Clindamycin NEGATIVE Sensitive     * ABUNDANT METHICILLIN RESISTANT STAPHYLOCOCCUS AUREUS  Resp Panel by RT-PCR (Flu A&B, Covid) Nasopharyngeal Swab     Status: None   Collection Time: 07/07/21  8:00 PM   Specimen: Nasopharyngeal Swab; Nasopharyngeal(NP) swabs in vial transport medium  Result Value Ref Range Status   SARS Coronavirus 2 by RT PCR NEGATIVE NEGATIVE Final    Comment: (NOTE) SARS-CoV-2 target nucleic acids are NOT DETECTED.  The SARS-CoV-2 RNA is generally detectable in upper respiratory specimens during the acute phase of infection. The lowest concentration of SARS-CoV-2 viral copies this assay can detect is 138 copies/mL. A negative result does not preclude SARS-Cov-2 infection and should not be used as the sole basis for treatment or other patient management decisions. A negative result may occur with   improper specimen collection/handling, submission of specimen other than nasopharyngeal swab, presence of viral mutation(s) within the areas targeted by this assay, and inadequate number of viral copies(<138 copies/mL). A negative result must be combined with clinical observations, patient history, and epidemiological information. The expected result is Negative.  Fact Sheet for Patients:  EntrepreneurPulse.com.au  Fact Sheet for Healthcare Providers:  IncredibleEmployment.be  This test is no t yet approved or cleared by the [place] FDA and  has been authorized for detection and/or diagnosis of SARS-CoV-2 by FDA under an Emergency Use Authorization (EUA). This EUA will remain  in effect (meaning this test can be used) for the duration of the COVID-19 declaration under Section 564(b)(1) of the Act, 21 U.S.C.section 360bbb-3(b)(1), unless the authorization is terminated  or revoked sooner.       Influenza A by PCR NEGATIVE NEGATIVE Final   Influenza B by PCR NEGATIVE NEGATIVE Final    Comment: (NOTE) The Xpert Xpress SARS-CoV-2/FLU/RSV plus assay is intended as an aid in the diagnosis of influenza from Nasopharyngeal swab specimens and should not be used as a sole basis for treatment. Nasal washings and aspirates are unacceptable for Xpert Xpress SARS-CoV-2/FLU/RSV testing.  Fact Sheet for Patients: EntrepreneurPulse.com.au  Fact Sheet for Healthcare Providers: IncredibleEmployment.be  This test is not yet approved or cleared by the [place] FDA and has been authorized for detection and/or diagnosis of SARS-CoV-2 by FDA under an Emergency Use Authorization (EUA). This EUA will remain in effect (meaning this test can be used) for the duration of the COVID-19 declaration under Section 564(b)(1) of the Act, 21 U.S.C. section 360bbb-3(b)(1), unless the authorization is terminated  or revoked.  Performed at [facility], [address] [place], [place] [address]          Radiology Studies: DG Chest 2 View  Result Date: 07/10/2021 CLINICAL DATA:  Congestive heart failure.  Shortness of breath. EXAM: CHEST - 2 VIEW COMPARISON:  07/07/2021 FINDINGS: 0747 hours. Low volume film. The cardio pericardial silhouette is enlarged. There is pulmonary vascular congestion without overt pulmonary edema. Diffuse interstitial opacity suggests edema. Telemetry leads overlie the chest. IMPRESSION: Enlarged cardiopericardial silhouette with pulmonary vascular congestion and diffuse interstitial opacity suggesting edema. Electronically Signed   By: Misty Stanley M.D.   On: 07/10/2021 10:49        Scheduled Meds:  amoxicillin-clavulanate  1 tablet Oral Q12H   aspirin EC  81 mg Oral Daily   doxycycline  100 mg Oral Q12H   enoxaparin (LOVENOX) injection  75 mg Subcutaneous Q24H   escitalopram  10 mg Oral Daily   fluticasone  2 spray Each Nare Daily   gabapentin  3,000 mg Oral QHS   guaiFENesin  600 mg Oral BID   insulin pump   Subcutaneous TID WC, HS, 0200   irbesartan  75 mg Oral Daily   levothyroxine  200 mcg Oral QAC breakfast   linaclotide  145 mcg Oral QAC breakfast   magnesium oxide  400 mg Oral Daily   metolazone  5 mg Oral Once per day on Mon Thu   pantoprazole  40 mg Oral BID   pramipexole  1 mg Oral QHS   traZODone  150 mg Oral QHS   zolpidem  5 mg Oral QHS   Continuous Infusions:   LOS: 2 days    Time spent: 28 minutes    Sharen Hones, MD [facility]   To contact the attending provider between 7A-7P or the covering provider during after hours 7P-7A, please log into the web site [email or web address] and access using universal [place] password for that web site. If you do not have the password, please call the hospital operator.  07/10/2021, 1:38 PM

## 2021-07-10 NOTE — NC FL2 (Signed)
[place] LEVEL OF CARE SCREENING TOOL     IDENTIFICATION  Patient Name: Brittney [name] Birthdate: 1951-06-17 Sex: female Admission Date (Current Location): 07/07/2021  [facility] and [place] Number:  Selena Lesser [id number] [place] and Address:  [facility], [address] [place], [place] [address]      Provider Number: [id number]  Attending Physician Name and Address:  Sharen Hones, MD  Relative Name and Phone Number:  Wendie Chess [phone]    Current Level of Care: Hospital Recommended Level of Care: [place] Prior Approval Number:    Date Approved/Denied:   PASRR Number:    Discharge Plan: SNF    Current Diagnoses: Patient Active Problem List   Diagnosis Date Noted   Cellulitis of right leg 07/09/2021   Left leg cellulitis 07/08/2021   CHF (congestive heart failure) ([place]) 07/07/2021   Chronic kidney disease, stage 3a ([place]) 07/07/2021   Left fibular fracture 07/07/2021   Bilateral lower leg cellulitis 08/18/2020   Rhabdomyolysis 08/18/2020   Fall at home, initial encounter 08/18/2020   Fall 08/18/2020   Primary osteoarthritis of left knee 11/26/2019   Sepsis ([place]) 07/30/2019   Type 2 diabetes mellitus with diabetic polyneuropathy, with long-term current use of insulin ([place]) 07/03/2019   OSA on CPAP 01/24/2019   Lymphedema 09/03/2018   Venous stasis of both lower extremities 09/03/2018   Status post lumbar spine surgery for decompression of spinal cord 03/30/2018   Myofascial pain 03/30/2018   Spinal stenosis of thoracic region 03/30/2018   Back pain 11/02/2017   Iron deficiency anemia 05/10/2017   Benign essential hypertension 03/10/2017   Long term prescription benzodiazepine use 01/18/2017   Vitamin D insufficiency 01/18/2017   Neurogenic pain 01/17/2017   L3-4 severe lumbar facet hypertrophy and spinal stenosis 01/10/2017   Diabetes with retinopathy ([place]) 12/07/2016   Diabetic nephropathy ([place])  12/07/2016   Gastroparesis due to DM ([place]) 12/07/2016   Long term current use of opiate analgesic 12/07/2016   Long term prescription opiate use 12/07/2016   Opiate use 12/07/2016   Chronic pain syndrome 12/07/2016   Chronic low back pain (Location of Primary Source of Pain) (Bilateral) (R>L) 12/07/2016   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/07/2016   Chronic lower extremity pain (Location of Secondary source of pain) (Bilateral) (R>L) 12/07/2016   Chronic knee pain (Location of Tertiary source of pain) (Right) 12/07/2016   Osteoarthritis of knee (Right) 12/07/2016   Grade 1 Anterolisthesis of L3 over L4 and L4 over L5 12/07/2016   Osteoarthritis of sacroiliac joint (Right) 12/07/2016   Generalized osteoarthritis of multiple sites 10/22/2016   Degenerative spondylolisthesis 09/08/2016   L3-4 severe lumbar spinal stenosis (12/31/2016 MRI) 09/08/2016   Lung nodule, multiple 08/31/2016   Hepatic cirrhosis ([place]) 06/20/2016   Fibromyalgia 04/15/2016   Seronegative arthritis 04/15/2016   Diabetes mellitus with peripheral vascular disease ([place]) 03/16/2016   Type 2 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, with long-term current use of insulin ([place]) 03/16/2016   Morbid obesity with BMI of 50.0-59.9, adult ([place]) 03/16/2016   Type 2 diabetes mellitus with diabetic nephropathy, with long-term current use of insulin ([place]) 03/16/2016   Microalbuminuria 02/20/2016   Essential hypertension 01/27/2016   DM (diabetes mellitus) type II uncontrolled, periph vascular disorder ([place]) 04/26/2014   Hyperlipemia 04/26/2014   OSA (obstructive sleep apnea) 04/26/2014   Abnormal tumor markers 12/11/2012   Chronic kidney disease, stage II (mild) 12/11/2012   Unilateral small kidney  12/11/2012    Orientation RESPIRATION BLADDER Height & Weight     Self, Time, Situation, Place  Normal External catheter Weight: (!) 152.3 kg Height:  5\' 6"   (167.6 cm)  BEHAVIORAL SYMPTOMS/MOOD NEUROLOGICAL BOWEL NUTRITION STATUS      Continent Diet  AMBULATORY STATUS COMMUNICATION OF NEEDS Skin   Extensive Assist Verbally Other (Comment) (bilateral lower extremety wounds)                       Personal Care Assistance Level of Assistance  Bathing, Feeding, Dressing Bathing Assistance: Limited assistance Feeding assistance: Limited assistance Dressing Assistance: Limited assistance     Functional Limitations Info  Sight, Hearing, Speech Sight Info: Adequate Hearing Info: Adequate Speech Info: Adequate    SPECIAL CARE FACTORS FREQUENCY  PT (By licensed PT), OT (By licensed OT)     PT Frequency: 5x week OT Frequency: 5x week            Contractures Contractures Info: Present    Additional Factors Info  Code Status, Allergies Code Status Info: Full Allergies Info: Shellfsh, Amlodipine, Cephalexin, Codeine, Imdur, Lyrica, Tape, Monosodium           Current Medications (07/10/2021):  This is the current hospital active medication list Current Facility-Administered Medications  Medication Dose Route Frequency Provider Last Rate Last Admin   acetaminophen (TYLENOL) tablet 650 mg  650 mg Oral Q6H PRN Athena Masse, MD       Or   acetaminophen (TYLENOL) suppository 650 mg  650 mg Rectal Q6H PRN Athena Masse, MD       albuterol (PROVENTIL) (2.5 MG/3ML) 0.083% nebulizer solution 3 mL  3 mL Nebulization TID PRN Sharen Hones, MD       amoxicillin-clavulanate (AUGMENTIN) 875-125 MG per tablet 1 tablet  1 tablet Oral Q12H Sharen Hones, MD   1 tablet at 07/10/21 1004   aspirin EC tablet 81 mg  81 mg Oral Daily Sharen Hones, MD   81 mg at 07/10/21 1003   doxycycline (VIBRA-TABS) tablet 100 mg  100 mg Oral Q12H Sharen Hones, MD   100 mg at 07/10/21 1004   enoxaparin (LOVENOX) injection 75 mg  75 mg Subcutaneous Q24H Sharen Hones, MD   75 mg at 07/10/21 1003   escitalopram (LEXAPRO) tablet 10 mg  10 mg Oral Daily Sharen Hones,  MD   10 mg at 07/10/21 1003   fentaNYL (SUBLIMAZE) injection 25 mcg  25 mcg Intravenous Q4H PRN Mansy, Jan A, MD   25 mcg at 07/09/21 1206   fluticasone (FLONASE) 50 MCG/ACT nasal spray 2 spray  2 spray Each Nare Daily Sharen Hones, MD   2 spray at 07/10/21 0931   gabapentin (NEURONTIN) capsule 3,000 mg  3,000 mg Oral QHS Dallie Piles, RPH   3,000 mg at 07/09/21 2145   guaiFENesin (MUCINEX) 12 hr tablet 600 mg  600 mg Oral BID Sharen Hones, MD   600 mg at 07/10/21 1003   insulin pump   Subcutaneous TID WC, HS, 0200 Sharen Hones, MD   Given at 07/10/21 1219   ipratropium-albuterol (DUONEB) 0.5-2.5 (3) MG/3ML nebulizer solution 3 mL  3 mL Nebulization Q6H PRN Sharen Hones, MD   3 mL at 07/08/21 2045   irbesartan (AVAPRO) tablet 75 mg  75 mg Oral Daily Sharen Hones, MD   75 mg at 07/10/21 1004   levothyroxine (SYNTHROID) tablet 200 mcg  200 mcg Oral QAC breakfast Sharen Hones, MD   200 mcg  at 07/10/21 0525   linaclotide (LINZESS) capsule 145 mcg  145 mcg Oral QAC breakfast Dallie Piles, RPH       magnesium oxide (MAG-OX) tablet 400 mg  400 mg Oral Daily Sharen Hones, MD   400 mg at 07/10/21 1004   metolazone (ZAROXOLYN) tablet 5 mg  5 mg Oral Once per day on Mon Thu Zhang, Dekui, MD   5 mg at 07/09/21 1004   ondansetron (ZOFRAN) tablet 4 mg  4 mg Oral Q6H PRN Athena Masse, MD   4 mg at 07/10/21 1004   Or   ondansetron (ZOFRAN) injection 4 mg  4 mg Intravenous Q6H PRN Athena Masse, MD   4 mg at 07/10/21 1207   oxyCODONE-acetaminophen (PERCOCET/ROXICET) 5-325 MG per tablet 1-2 tablet  1-2 tablet Oral Q4H PRN Sharen Hones, MD   2 tablet at 07/10/21 1004   pantoprazole (PROTONIX) EC tablet 40 mg  40 mg Oral BID Sharen Hones, MD   40 mg at 07/10/21 1004   pramipexole (MIRAPEX) tablet 1 mg  1 mg Oral QHS Sharen Hones, MD   1 mg at 07/09/21 2150   senna-docusate (Senokot-S) tablet 1 tablet  1 tablet Oral QHS PRN Athena Masse, MD       traZODone (DESYREL) tablet 150 mg  150 mg Oral QHS Sharen Hones, MD   150 mg at 07/09/21 2149   zolpidem (AMBIEN) tablet 5 mg  5 mg Oral QHS Sharen Hones, MD   5 mg at 07/09/21 2150     Discharge Medications: Please see discharge summary for a list of discharge medications.  Relevant Imaging Results:  Relevant Lab Results:   Additional Information left ankle  Kerin Salen, RN

## 2021-07-10 NOTE — Progress Notes (Signed)
Breakfast time CBG is 133. Insulin self-administered.

## 2021-07-10 NOTE — Progress Notes (Signed)
CBG at 7 pm:157 self administered 5 units home insulin. CBG at 845 pm: 121, no insulin administered.

## 2021-07-10 NOTE — Progress Notes (Addendum)
Patient Name: Brittney [name] Date of Encounter: 07/10/2021  Hospital Problem List     Active Problems:   Diabetes mellitus with peripheral vascular disease [facility])   Essential hypertension   Venous stasis of both lower extremities   OSA on CPAP   Fall at home, initial encounter   CHF (congestive heart failure) ([place])   Chronic kidney disease, stage 3a ([place])   Left fibular fracture   Left leg cellulitis   Cellulitis of right leg    Patient Profile      70 y.o. female with history of diabetes on an insulin pump, morbid obesity and wheelchair dependent, surgical hypothyroidism, HTN, chronic venous stasis wounds, COPD on home O2 at 3 L, OSA on CPAP, CAD, on chronic narcotics for osteoarthritic pain, history of falls presenting following a fall in which she sustained an injury to the left ankle.  She is also concerned about worsening swelling in her bilateral lower extremities and a 20 pound weight gain in spite of increases in her diuretic medication doses.  Patient denies chest pain.  Shortness of breath is at baseline.  Denies cough, fever or chills and denies abdominal pain or diarrhea. She follows with cardiology not have a CHF diagnosis.  Her last echo in August 2021 showed EF 55%.  CXR showed mild chf  Subjective   Denies chest pain or sob.   Inpatient Medications     amoxicillin-clavulanate  1 tablet Oral Q12H   aspirin EC  81 mg Oral Daily   doxycycline  100 mg Oral Q12H   enoxaparin (LOVENOX) injection  75 mg Subcutaneous Q24H   escitalopram  10 mg Oral Daily   fluticasone  2 spray Each Nare Daily   gabapentin  3,000 mg Oral QHS   guaiFENesin  600 mg Oral BID   insulin pump   Subcutaneous TID WC, HS, 0200   irbesartan  75 mg Oral Daily   levothyroxine  200 mcg Oral QAC breakfast   magnesium oxide  400 mg Oral Daily   metolazone  5 mg Oral Once per day on Mon Thu   pantoprazole  40 mg Oral BID   pramipexole  1 mg Oral QHS   traZODone  150 mg Oral QHS   zolpidem  5 mg  Oral QHS    Vital Signs    Vitals:   07/09/21 0758 07/09/21 1530 07/09/21 1955 07/10/21 0351  BP: (!) 140/58 (!) 167/61 (!) 177/60 (!) 174/75  Pulse: 68 70 74 78  Resp: 18 18 16 18   Temp: 97.6 F (36.4 C) 98.4 F (36.9 C) 98.1 F (36.7 C) 98.3 F (36.8 C)  TempSrc: Oral Oral Oral Oral  SpO2: 90% 97% 92% 92%  Weight:  (!) 152.3 kg    Height:        Intake/Output Summary (Last 24 hours) at 07/10/2021 0725 Last data filed at 07/10/2021 0000 Gross per 24 hour  Intake --  Output 2250 ml  Net -2250 ml   Filed Weights   07/07/21 1536 07/09/21 0500 07/09/21 1530  Weight: (!) 148.3 kg (!) 153 kg (!) 152.3 kg    Physical Exam    GEN: Well nourished, well developed, in no acute distress.  HEENT: normal.  Neck: Supple, no JVD, carotid bruits, or masses. Cardiac: RRR, no murmurs, rubs, or gallops. No clubbing, cyanosis, edema.  Radials/DP/PT 2+ and equal bilaterally.  Respiratory:  Respirations regular and unlabored, clear to auscultation bilaterally. GI: Soft, nontender, nondistended, BS + x 4. MS: no  deformity or atrophy. Skin: warm and dry, no rash. Neuro:  Strength and sensation are intact. Psych: Normal affect.  Labs    CBC Recent Labs    07/07/21 1547 07/09/21 0429  WBC 6.4 5.5  NEUTROABS 3.7 2.4  HGB 10.7* 10.4*  HCT 32.7* 32.4*  MCV 91.6 93.4  PLT 177 654   Basic Metabolic Panel Recent Labs    07/07/21 1547 07/09/21 0429  NA 135 138  K 4.4 4.2  CL 96* 98  CO2 36* 33*  GLUCOSE 180* 134*  BUN 35* 24*  CREATININE 1.15* 1.14*  CALCIUM 9.5 9.5  MG  --  2.0   Liver Function Tests Recent Labs    07/07/21 1547  AST 27  ALT 23  ALKPHOS 78  BILITOT 0.6  PROT 6.5  ALBUMIN 3.1*   No results for input(s): LIPASE, AMYLASE in the last 72 hours. Cardiac Enzymes No results for input(s): CKTOTAL, CKMB, CKMBINDEX, TROPONINI in the last 72 hours. BNP Recent Labs    07/07/21 1547  BNP 81.9   D-Dimer No results for input(s): DDIMER in the last 72  hours. Hemoglobin A1C No results for input(s): HGBA1C in the last 72 hours. Fasting Lipid Panel No results for input(s): CHOL, HDL, LDLCALC, TRIG, CHOLHDL, LDLDIRECT in the last 72 hours. Thyroid Function Tests No results for input(s): TSH, T4TOTAL, T3FREE, THYROIDAB in the last 72 hours.  Invalid input(s): FREET3  Telemetry    nsr  ECG    nsr  Radiology    DG Chest 2 View  Result Date: 07/07/2021 CLINICAL DATA:  Shortness of breath and history of recent fall EXAM: CHEST - 2 VIEW COMPARISON:  08/18/2020 FINDINGS: Cardiac shadow is enlarged. Vascular congestion is noted with mild interstitial edema new from the prior exam. Spinal stimulator is seen as well as postsurgical changes in the cervical spine. No acute bony abnormality is noted. No focal infiltrate is seen. IMPRESSION: Mild CHF. Electronically Signed   By: Inez Catalina M.D.   On: 07/07/2021 17:18   DG Tibia/Fibula Left  Result Date: 07/07/2021 CLINICAL DATA:  History of recent fall with left leg pain, initial encounter EXAM: LEFT TIBIA AND FIBULA - 2 VIEW COMPARISON:  None. FINDINGS: Left knee prosthesis is noted. There is an oblique fracture through the distal fibular metaphysis with only minimal displacement. Generalized soft tissue swelling is noted about the ankle. Mild cortical irregularity is noted in the distal tibial metaphysis. Undisplaced cortical fracture could not be totally excluded. Calcaneal spurring is noted. IMPRESSION: Undisplaced distal fibular fracture with associated soft tissue swelling. Findings suspicious for distal tibial fracture as described. Electronically Signed   By: Inez Catalina M.D.   On: 07/07/2021 17:20   DG Tibia/Fibula Right  Result Date: 07/07/2021 CLINICAL DATA:  Recent fall with right lower leg pain, initial encounter EXAM: RIGHT TIBIA AND FIBULA - 2 VIEW COMPARISON:  None. FINDINGS: Degenerative changes are noted about the knee joint. Calcaneal spurring is noted. No acute fracture or  dislocation is seen. No gross soft tissue abnormality is noted. IMPRESSION: No acute fracture is seen. Electronically Signed   By: Inez Catalina M.D.   On: 07/07/2021 17:20   CT Head Wo Contrast  Result Date: 07/07/2021 CLINICAL DATA:  Head trauma, mental status changes of unknown cause. EXAM: CT HEAD WITHOUT CONTRAST TECHNIQUE: Contiguous axial images were obtained from the base of the skull through the vertex without intravenous contrast. COMPARISON:  Prior studies from August of 2021 in July of 2013 FINDINGS: Brain:  No evidence of acute infarction, hemorrhage, hydrocephalus, extra-axial collection or mass lesion/mass effect. Signs of generalized atrophy as before. Areas of parenchymal calcification also similar to prior imaging. Unchanged enlargement of LEFT sylvian fissure without mass effect. 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 Vascular: No hyperdense vessel or unexpected calcification. Skull: Normal. Negative for fracture or focal lesion. Sinuses/Orbits: Visualized paranasal sinuses and orbits are unremarkable. Other: None. IMPRESSION: 1. No acute intracranial pathology. 2. Signs of generalized atrophy and sylvian fissure enlargement as well as prior infarcts similar to the previous CT evaluation. Electronically Signed   By: Zetta Bills M.D.   On: 07/07/2021 19:44   DG Foot Complete Left  Result Date: 07/07/2021 CLINICAL DATA:  Recent fall with left foot pain, initial EXAM: LEFT FOOT - COMPLETE 3+ VIEW COMPARISON:  None. FINDINGS: Undisplaced distal fibular fracture is again seen with soft tissue swelling. Considerable soft tissue swelling is noted in the foot. No other fracture is seen. IMPRESSION: Soft tissue swelling. Distal fibular fracture is again noted. Electronically Signed   By: Inez Catalina M.D.   On: 07/07/2021 17:23   DG Foot Complete Right  Result Date: 07/07/2021 CLINICAL DATA:  Recent fall with right foot pain, initial encounter EXAM: RIGHT FOOT COMPLETE - 3+  VIEW COMPARISON:  None. FINDINGS: Examination is somewhat limited due to overlying extrinsic artifact. Considerable soft tissue swelling is noted about the metatarsals dorsally. Calcaneal spurring is seen. Irregularity at the base of the first distal phalanx which may represent an undisplaced fracture. Correlate to point tenderness. No other focal abnormality is noted. Healed fibular fracture is noted distally. IMPRESSION: Question fracture of the first distal phalanx as described. Healed distal right fibular fracture Electronically Signed   By: Inez Catalina M.D.   On: 07/07/2021 17:22   ECHOCARDIOGRAM COMPLETE  Result Date: 07/08/2021    ECHOCARDIOGRAM REPORT   Patient Name:   Brittney [name] Date of Exam: 07/08/2021 Medical Rec #:  [id number]        Height:       66.0 in Accession #:    [id number]       Weight:       327.0 lb Date of Birth:  Oct 31, 1951         BSA:          2.465 m Patient Age:    77 years         BP:           132/57 mmHg Patient Gender: F                HR:           72 bpm. Exam Location:  [facility] Procedure: 2D Echo, Cardiac Doppler and Color Doppler Indications:     CHF-acute diastolic B01.75  History:         Patient has prior history of Echocardiogram examinations, most                  recent 08/18/2020. Risk Factors:Diabetes and Hypertension.  Sonographer:     Sherrie Sport RDCS (AE) Referring Phys:  [id number] Athena Masse Diagnosing Phys: Bartholome Bill MD  Sonographer Comments: Technically challenging study due to limited acoustic windows and no apical window. IMPRESSIONS  1. Left ventricular ejection fraction, by estimation, is 65 to 70%. The left ventricle has normal function. The left ventricle has no regional wall motion abnormalities. Left ventricular diastolic parameters were normal.  2. Right ventricular systolic function is normal. The right  ventricular size is mildly enlarged.  3. The mitral valve was not well visualized. Mild mitral valve regurgitation.  4. The aortic valve was not  well visualized. Aortic valve regurgitation is trivial. FINDINGS  Left Ventricle: Left ventricular ejection fraction, by estimation, is 65 to 70%. The left ventricle has normal function. The left ventricle has no regional wall motion abnormalities. The left ventricular internal cavity size was normal in size. There is  borderline left ventricular hypertrophy. Left ventricular diastolic parameters were normal. Right Ventricle: The right ventricular size is mildly enlarged. No increase in right ventricular wall thickness. Right ventricular systolic function is normal. Left Atrium: Left atrial size was normal in size. Right Atrium: Right atrial size was normal in size. Pericardium: There is no evidence of pericardial effusion. Mitral Valve: The mitral valve was not well visualized. Mild mitral valve regurgitation. Tricuspid Valve: The tricuspid valve is not well visualized. Tricuspid valve regurgitation is trivial. Aortic Valve: The aortic valve was not well visualized. Aortic valve regurgitation is trivial. Pulmonic Valve: The pulmonic valve was not assessed. Pulmonic valve regurgitation is not visualized. Aorta: The aortic root was not well visualized. IAS/Shunts: The interatrial septum was not well visualized.  LEFT VENTRICLE PLAX 2D LVIDd:         4.89 cm LVIDs:         3.25 cm LV PW:         1.61 cm LV IVS:        1.38 cm LVOT diam:     2.00 cm LVOT Area:     3.14 cm  LEFT ATRIUM         Index LA diam:    4.00 cm 1.62 cm/m                        PULMONIC VALVE AORTA                 PV Vmax:        0.73 m/s Ao Root diam: 3.40 cm PV Peak grad:   2.1 mmHg                       RVOT Peak grad: 2 mmHg   SHUNTS Systemic Diam: 2.00 cm Bartholome Bill MD Electronically signed by Bartholome Bill MD Signature Date/Time: 07/08/2021/3:50:42 PM    Final     Assessment & Plan    1 CHF-has diuresed 2.5 liters since admission. Creatinine improved slightly since yesterday but still above baseline. Hold diuresis for now and follow  symptoms and electrolytes and renal function. BNP was normal on admission. Not clinically in chf. Conitnue with current meds.   2. Fibular fx. Nondisplaced. Non surgical   Will sign off. Dr. Clayborn Bigness following our patients this weekend.    Signed, Javier Docker Meliss Fleek MD 07/10/2021, 7:25 AM  Pager: [phone]

## 2021-07-11 DIAGNOSIS — L03115 Cellulitis of right lower limb: Secondary | ICD-10-CM | POA: Diagnosis not present

## 2021-07-11 DIAGNOSIS — S82832A Other fracture of upper and lower end of left fibula, initial encounter for closed fracture: Secondary | ICD-10-CM | POA: Diagnosis not present

## 2021-07-11 MED ORDER — CYCLOBENZAPRINE HCL 10 MG PO TABS
20.0000 mg | ORAL_TABLET | Freq: Every day | ORAL | Status: DC
  Administered 2021-07-11 – 2021-07-12 (×2): 20 mg via ORAL
  Filled 2021-07-11 (×2): qty 2

## 2021-07-11 NOTE — Progress Notes (Signed)
Patient blood sugar before lunch was 136. Patient gave 3 units of insulin via insulin pump.

## 2021-07-11 NOTE — Progress Notes (Signed)
CBG at 445: 73, no insulin administered.

## 2021-07-11 NOTE — Progress Notes (Signed)
Physical Therapy Treatment Patient Details Name: Brittney [name] MRN: [id number] DOB: 26-Sep-1951 Today's Date: 07/11/2021    History of Present Illness Pt is a 70 yo female that presented to the ED after a fall at home. 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       PT Comments    Pt was long sitting in bed upon arriving. He is extremely pleasant but very anxious about session." I'll try if you go slow with me." Pt is TDWB on LLE. BLEs were warm to touch and per pt MD examined this morning during his rounds. Per pt," He did not seem concerned by edema/blister on LLE. Pt endorses 7/10 pain. She is anxious throughout session. Was able to roll R to short sit with max assist of one. Increased time and vcs for improved technique and sequencing. BP 155/65, sao2 95%, HR 66 bpm. Max encouragement to attempt sideboard transfer to recliner, however pt was unwilling. Pt is very fearful and requested to perform exercises only. Did perform assisted there ex in bed and EOB. Lengthy discussion about need for increased activity going forward." I will do it next time when I have pain medicine ." Do recommend pt be pre-medicated prior to next PT session. Anxiety limited session more so than pain. Highly recommend DC to rehab to address deficits while decreasing caregiver burden.    Follow Up Recommendations  SNF     Equipment Recommendations  Other (comment) (defer to next level of care. pt has slideboard at home already)       Precautions / Restrictions Precautions Precautions: Fall Restrictions Weight Bearing Restrictions: Yes LLE Weight Bearing: Touchdown weight bearing    Mobility  Bed Mobility Overal bed mobility: Needs Assistance Bed  Mobility: Supine to Sit;Sit to Supine Rolling: Mod assist   Supine to sit: Max assist Sit to supine: Max assist   General bed mobility comments: pt required max assist to exit R side of bed and to return. performed log roll technique with increased time and vcs for maintaining proper wt bearing.    Transfers      General transfer comment: max encouragement for OOB activity however pt unwilling.    Balance Overall balance assessment: Needs assistance Sitting-balance support: Single extremity supported;Bilateral upper extremity supported (RLE support on floor. LLE floating.) Sitting balance-Leahy Scale: Fair     Cognition Arousal/Alertness: Awake/alert Behavior During Therapy: Anxious Overall Cognitive Status: Within Functional Limits for tasks assessed        General Comments: Pt is A and O x 4 but very resistive to session. Apprenhensive about session but was willing to sit EOB and perform ther ex but unwilling to trial slideboard/standing transfers      Exercises General Exercises - Lower Extremity Quad Sets: AROM;Strengthening;Both;10 reps;Seated Gluteal Sets: AROM;Strengthening;Both;10 reps;Seated Long Arc Quad: AROM;10 reps;Seated (EOB) Heel Slides: AROM;10 reps Hip ABduction/ADduction: AROM;10 reps Straight Leg Raises: AAROM;10 reps        Pertinent Vitals/Pain Pain Assessment: 0-10 Pain Score: 8  Faces Pain Scale: Hurts whole lot Pain Location: L leg, ankle Pain Descriptors / Indicators: Sharp;Shooting;Guarding;Grimacing;Crying Pain Intervention(s): Limited activity within patient's tolerance;Monitored during session;Repositioned     PT Goals (current goals can now be found in the care plan section) Acute Rehab PT  Goals Patient Stated Goal: rehab then home Progress towards PT goals: Not progressing toward goals - comment (limited by pain/anxiety)    Frequency    Min 2X/week      PT Plan Current plan remains appropriate       AM-PAC PT "6  Clicks" Mobility   Outcome Measure  Help needed turning from your back to your side while in a flat bed without using bedrails?: A Lot Help needed moving from lying on your back to sitting on the side of a flat bed without using bedrails?: A Lot Help needed moving to and from a bed to a chair (including a wheelchair)?: Total Help needed standing up from a chair using your arms (e.g., wheelchair or bedside chair)?: Total Help needed to walk in hospital room?: Total Help needed climbing 3-5 steps with a railing? : Total 6 Click Score: 8    End of Session   Activity Tolerance: Patient limited by pain;Other (comment) (limiited by fear/ anxiety with OOB activity.) Patient left: in bed;with call bell/phone within reach;with bed alarm set Nurse Communication: Mobility status PT Visit Diagnosis: Other abnormalities of gait and mobility (R26.89);Pain;Muscle weakness (generalized) (M62.81) Pain - Right/Left: Left Pain - part of body: Leg;Ankle and joints of foot     Time: 1303-1330 PT Time Calculation (min) (ACUTE ONLY): 27 min  Charges:  $Therapeutic Exercise: 8-22 mins $Therapeutic Activity: 8-22 mins                     Julaine Fusi PTA 07/11/21, 1:47 PM

## 2021-07-11 NOTE — Progress Notes (Signed)
Patient rechecked blood sugar and was 88, so no insulin given via insulin pump.

## 2021-07-11 NOTE — Progress Notes (Signed)
CBG check on insulin pump at  2115: 144, no self insulin administered.

## 2021-07-11 NOTE — TOC Progression Note (Signed)
Transition of Care [facility]) - Progression Note    Patient Details  Name: AMALIA EDGECOMBE MRN: [id number] Date of Birth: Apr 29, 1951  Transition of Care [facility]) CM/SW [place], LCSW Phone Number: 07/11/2021, 12:45 PM  Clinical Narrative:   Currently, patient has no bed offers. Asked Magda Paganini with [facility] to review referral as per [facility] note that is family's first choice.    Expected Discharge Plan: [place] Barriers to Discharge: Continued Medical Work up  Expected Discharge Plan and Services Expected Discharge Plan: [place] In-house Referral: Clinical Social Work   Post Acute Care Choice: [place] Living arrangements for the past 2 months: Single Family Home                                       Social Determinants of Health (SDOH) Interventions    Readmission Risk Interventions Readmission Risk Prevention Plan 07/10/2021  Transportation Screening Complete  PCP or Specialist Appt within 3-5 Days Complete  HRI or [place] Complete  Social Work Consult for [place] Planning/Counseling Complete  Palliative Care Screening Not Applicable  Medication Review [employer]) Complete  Some recent data might be hidden

## 2021-07-11 NOTE — Progress Notes (Signed)
PROGRESS NOTE    SHAMILA LERCH  ZCH:[id number] DOB: March 31, 1951 DOA: 07/07/2021 PCP: Rusty Aus, MD   Chief complaint.  Bilateral leg pain. Brief Narrative:  Brittney [name] is a 70 y.o. female with medical history significant for diabetes on an insulin pump, morbid obesity and wheelchair dependent, surgical hypothyroidism, HTN, chronic venous stasis wounds currently on Bactrim, COPD on home O2 at 3 L, OSA on CPAP, CAD, on chronic narcotics for osteoarthritic pain, history of falls presenting following a fall in which she sustained an injury to the left ankle.  She is also concerned about worsening swelling in her bilateral lower extremities and a 20 pound weight gain in spite of increases in her diuretic medication doses. Patient was giving IV Lasix offered admission to the hospital, her BNP was normal. X-ray showed left ankle fracture, she also has right leg venous stasis ulcer, with the redness, warmth and tender to touch.  She is started on oral antibiotics with doxycycline and Augmentin for cellulitis   Assessment & Plan:   Active Problems:   Diabetes mellitus with peripheral vascular disease ([place])   Essential hypertension   Venous stasis of both lower extremities   OSA on CPAP   Fall at home, initial encounter   CHF (congestive heart failure) (HCC)   Chronic kidney disease, stage 3a (HCC)   Left fibular fracture   Left leg cellulitis   Cellulitis of right leg  #1.  Bilateral lower extremity venous stasis with stasis ulcers. Left ankle fracture from fall. Fall at home. Right lower extremity cellulitis secondary to MRSA. Continue PT/OT.  Continue pain medicine. Continue antibiotics with doxycycline and Augmentin.  Condition improving.  #2.  Morbid obesity. Chronic hypoxemic respiratory failure. Obstructive sleep apnea. COPD Chronic diastolic congestive heart failure . Patient is stable.  DVT prophylaxis: Lovenox Code Status: full Family Communication:   Disposition Plan:    Status is: Inpatient  Remains inpatient appropriate because:Unsafe d/c plan  Dispo: The patient is from: Home              Anticipated d/c is to: SNF              Patient currently is medically stable to d/c.   Difficult to place patient No        I/O last 3 completed shifts: In: -  Out: 2300 [Urine:2300] No intake/output data recorded.     Consultants:  Orthopedics  Procedures: None  Antimicrobials:  Augmentin and doxycycline. Subjective: Patient condition stable, she has baseline shortness of breath.  She has bilateral leg pain which is improving. No fever chills pain Abdominal pain nausea vomiting. No dysuria hematuria.  Objective: Vitals:   07/10/21 2023 07/11/21 0404 07/11/21 0757 07/11/21 0814  BP: (!) 165/71 138/61 134/61 (!) 146/56  Pulse: 81 74 68 71  Resp: 20 16 18 18   Temp: 99.7 F (37.6 C) 98.4 F (36.9 C) 99.1 F (37.3 C) 98.3 F (36.8 C)  TempSrc: Oral Oral Oral Oral  SpO2: 93% 96% 93% 92%  Weight:  (!) 152.7 kg    Height:        Intake/Output Summary (Last 24 hours) at 07/11/2021 1223 Last data filed at 07/11/2021 0502 Gross per 24 hour  Intake --  Output 700 ml  Net -700 ml   Filed Weights   07/09/21 0500 07/09/21 1530 07/11/21 0404  Weight: (!) 153 kg (!) 152.3 kg (!) 152.7 kg    Examination:  General exam: Appears calm and comfortable  Respiratory  system: Clear to auscultation. Respiratory effort normal. Cardiovascular system: S1 & S2 heard, RRR. No JVD, murmurs, rubs, gallops or clicks. No pedal edema. Gastrointestinal system: Abdomen is nondistended, soft and nontender. No organomegaly or masses felt. Normal bowel sounds heard. Central nervous system: Alert and oriented. No focal neurological deficits. Extremities: Bilateral lower extremity chronic edema, cellulitis much improved. Skin: No rashes, lesions or ulcers Psychiatry: Judgement and insight appear normal. Mood & affect appropriate.     Data  Reviewed: I have personally reviewed following labs and imaging studies  CBC: Recent Labs  Lab 07/07/21 1547 07/09/21 0429  WBC 6.4 5.5  NEUTROABS 3.7 2.4  HGB 10.7* 10.4*  HCT 32.7* 32.4*  MCV 91.6 93.4  PLT 177 846   Basic Metabolic Panel: Recent Labs  Lab 07/07/21 1547 07/09/21 0429  NA 135 138  K 4.4 4.2  CL 96* 98  CO2 36* 33*  GLUCOSE 180* 134*  BUN 35* 24*  CREATININE 1.15* 1.14*  CALCIUM 9.5 9.5  MG  --  2.0   GFR: Estimated Creatinine Clearance: 72.1 mL/min (A) (by C-G formula based on SCr of 1.14 mg/dL (H)). Liver Function Tests: Recent Labs  Lab 07/07/21 1547  AST 27  ALT 23  ALKPHOS 78  BILITOT 0.6  PROT 6.5  ALBUMIN 3.1*   No results for input(s): LIPASE, AMYLASE in the last 168 hours. No results for input(s): AMMONIA in the last 168 hours. Coagulation Profile: No results for input(s): INR, PROTIME in the last 168 hours. Cardiac Enzymes: No results for input(s): CKTOTAL, CKMB, CKMBINDEX, TROPONINI in the last 168 hours. BNP (last 3 results) No results for input(s): PROBNP in the last 8760 hours. HbA1C: No results for input(s): HGBA1C in the last 72 hours. CBG: Recent Labs  Lab 07/08/21 0542 07/08/21 0801 07/08/21 1325  GLUCAP 72 93 123*   Lipid Profile: No results for input(s): CHOL, HDL, LDLCALC, TRIG, CHOLHDL, LDLDIRECT in the last 72 hours. Thyroid Function Tests: No results for input(s): TSH, T4TOTAL, FREET4, T3FREE, THYROIDAB in the last 72 hours. Anemia Panel: No results for input(s): VITAMINB12, FOLATE, FERRITIN, TIBC, IRON, RETICCTPCT in the last 72 hours. Sepsis Labs: No results for input(s): PROCALCITON, LATICACIDVEN in the last 168 hours.  Recent Results (from the past 240 hour(s))  Resp Panel by RT-PCR (Flu A&B, Covid) Nasopharyngeal Swab     Status: None   Collection Time: 07/07/21  8:00 PM   Specimen: Nasopharyngeal Swab; Nasopharyngeal(NP) swabs in vial transport medium  Result Value Ref Range Status   SARS  Coronavirus 2 by RT PCR NEGATIVE NEGATIVE Final    Comment: (NOTE) SARS-CoV-2 target nucleic acids are NOT DETECTED.  The SARS-CoV-2 RNA is generally detectable in upper respiratory specimens during the acute phase of infection. The lowest concentration of SARS-CoV-2 viral copies this assay can detect is 138 copies/mL. A negative result does not preclude SARS-Cov-2 infection and should not be used as the sole basis for treatment or other patient management decisions. A negative result may occur with  improper specimen collection/handling, submission of specimen other than nasopharyngeal swab, presence of viral mutation(s) within the areas targeted by this assay, and inadequate number of viral copies(<138 copies/mL). A negative result must be combined with clinical observations, patient history, and epidemiological information. The expected result is Negative.  Fact Sheet for Patients:  EntrepreneurPulse.com.au  Fact Sheet for Healthcare Providers:  IncredibleEmployment.be  This test is no t yet approved or cleared by the [place] FDA and  has been authorized for detection  and/or diagnosis of SARS-CoV-2 by FDA under an Emergency Use Authorization (EUA). This EUA will remain  in effect (meaning this test can be used) for the duration of the COVID-19 declaration under Section 564(b)(1) of the Act, 21 U.S.C.section 360bbb-3(b)(1), unless the authorization is terminated  or revoked sooner.       Influenza A by PCR NEGATIVE NEGATIVE Final   Influenza B by PCR NEGATIVE NEGATIVE Final    Comment: (NOTE) The Xpert Xpress SARS-CoV-2/FLU/RSV plus assay is intended as an aid in the diagnosis of influenza from Nasopharyngeal swab specimens and should not be used as a sole basis for treatment. Nasal washings and aspirates are unacceptable for Xpert Xpress SARS-CoV-2/FLU/RSV testing.  Fact Sheet for  Patients: EntrepreneurPulse.com.au  Fact Sheet for Healthcare Providers: IncredibleEmployment.be  This test is not yet approved or cleared by the [place] FDA and has been authorized for detection and/or diagnosis of SARS-CoV-2 by FDA under an Emergency Use Authorization (EUA). This EUA will remain in effect (meaning this test can be used) for the duration of the COVID-19 declaration under Section 564(b)(1) of the Act, 21 U.S.C. section 360bbb-3(b)(1), unless the authorization is terminated or revoked.  Performed at [facility], [address], [place], [place] [address]          Radiology Studies: DG Chest 2 View  Result Date: 07/10/2021 CLINICAL DATA:  Congestive heart failure.  Shortness of breath. EXAM: CHEST - 2 VIEW COMPARISON:  07/07/2021 FINDINGS: 0747 hours. Low volume film. The cardio pericardial silhouette is enlarged. There is pulmonary vascular congestion without overt pulmonary edema. Diffuse interstitial opacity suggests edema. Telemetry leads overlie the chest. IMPRESSION: Enlarged cardiopericardial silhouette with pulmonary vascular congestion and diffuse interstitial opacity suggesting edema. Electronically Signed   By: Misty Stanley M.D.   On: 07/10/2021 10:49        Scheduled Meds:  amoxicillin-clavulanate  1 tablet Oral Q12H   aspirin EC  81 mg Oral Daily   carvedilol  25 mg Oral BID WC   doxycycline  100 mg Oral Q12H   enoxaparin (LOVENOX) injection  75 mg Subcutaneous Q24H   escitalopram  10 mg Oral Daily   fluticasone  2 spray Each Nare Daily   gabapentin  3,000 mg Oral QHS   guaiFENesin  600 mg Oral BID   insulin pump   Subcutaneous TID WC, HS, 0200   irbesartan  75 mg Oral Daily   levothyroxine  200 mcg Oral QAC breakfast   linaclotide  145 mcg Oral QAC breakfast   magnesium oxide  400 mg Oral Daily   metolazone  5 mg Oral Once per day on Mon Thu   pantoprazole  40 mg Oral BID   pramipexole   1 mg Oral QHS   traZODone  150 mg Oral QHS   zolpidem  5 mg Oral QHS   Continuous Infusions:   LOS: 3 days    Time spent: 28 minutes    Sharen Hones, MD [facility]   To contact the attending provider between 7A-7P or the covering provider during after hours 7P-7A, please log into the web site [email or web address] and access using universal [place] password for that web site. If you do not have the password, please call the hospital operator.  07/11/2021, 12:23 PM

## 2021-07-12 ENCOUNTER — Inpatient Hospital Stay: Payer: Medicare Other

## 2021-07-12 DIAGNOSIS — S82832A Other fracture of upper and lower end of left fibula, initial encounter for closed fracture: Secondary | ICD-10-CM | POA: Diagnosis not present

## 2021-07-12 DIAGNOSIS — I5032 Chronic diastolic (congestive) heart failure: Secondary | ICD-10-CM | POA: Diagnosis not present

## 2021-07-12 DIAGNOSIS — L03115 Cellulitis of right lower limb: Secondary | ICD-10-CM | POA: Diagnosis not present

## 2021-07-12 MED ORDER — FLUCONAZOLE 100 MG PO TABS
200.0000 mg | ORAL_TABLET | Freq: Once | ORAL | Status: AC
  Administered 2021-07-12: 200 mg via ORAL
  Filled 2021-07-12: qty 2

## 2021-07-12 NOTE — TOC Progression Note (Addendum)
Transition of Care (TOC) - Progression Note    Patient Details  Name: Brittney [name] MRN: [id number] Date of Birth: 12/04/1951  Transition of Care (TOC) CM/SW Contact  Meagan E Hagwood, LCSW Phone Number: 07/12/2021, 10:08 AM  Clinical Narrative:   Called Leslie at [facility] who reported they could take patient on Tuesday pending financials and patient being medically ready. Spoke to patient who states that [facility] was not her top choice, requested CSW speak with patient and daughter when daughter arrives.  10:20- Met with patient and daughter (via speaker phone) at bedside. They would like patient to go to [facility]. Patient is not vaccinated for COVID. Patient has not been to SNF before so has not used Medicare days. [facility] will have a bed Tuesday if patient is medically ready. TOC will continue to follow. Will need to start auth Monday if appropriate. Updated Leslie at [facility].  Expected Discharge Plan: Skilled Nursing Facility Barriers to Discharge: Continued Medical Work up  Expected Discharge Plan and Services Expected Discharge Plan: Skilled Nursing Facility In-house Referral: Clinical Social Work   Post Acute Care Choice: Skilled Nursing Facility Living arrangements for the past 2 months: Single Family Home                                       Social Determinants of Health (SDOH) Interventions    Readmission Risk Interventions Readmission Risk Prevention Plan 07/10/2021  Transportation Screening Complete  PCP or Specialist Appt within 3-5 Days Complete  HRI or Home Care Consult Complete  Social Work Consult for Recovery Care Planning/Counseling Complete  Palliative Care Screening Not Applicable  Medication Review (RN Care Manager) Complete  Some recent data might be hidden    

## 2021-07-12 NOTE — Progress Notes (Addendum)
PROGRESS NOTE    Brittney [name]  RDE:[id number] DOB: 09-14-1951 DOA: 07/07/2021 PCP: Rusty Aus, MD   Chief complaint.  Bilateral leg pain. Brief Narrative:  Brittney [name] is a 70 y.o. female with medical history significant for diabetes on an insulin pump, morbid obesity and wheelchair dependent, surgical hypothyroidism, HTN, chronic venous stasis wounds currently on Bactrim, COPD on home O2 at 3 L, OSA on CPAP, CAD, on chronic narcotics for osteoarthritic pain, history of falls presenting following a fall in which she sustained an injury to the left ankle.  She is also concerned about worsening swelling in her bilateral lower extremities and a 20 pound weight gain in spite of increases in her diuretic medication doses. Patient was giving IV Lasix offered admission to the hospital, her BNP was normal. X-ray showed left ankle fracture, she also has right leg venous stasis ulcer, with the redness, warmth and tender to touch.  She is started on oral antibiotics with doxycycline and Augmentin for cellulitis   Assessment & Plan:   Active Problems:   Diabetes mellitus with peripheral vascular disease ([place])   Essential hypertension   Venous stasis of both lower extremities   OSA on CPAP   Fall at home, initial encounter   CHF (congestive heart failure) (HCC)   Chronic kidney disease, stage 3a (HCC)   Left fibular fracture   Left leg cellulitis   Cellulitis of right leg  #1.  Bilateral lower extremity venous stasis with stasis ulcers. Left ankle fracture from fall. Fall at home. Right lower extremity cellulitis secondary to MRSA. Patient condition stable, patient was already able to sit at bedside, not able to ambulate.  Continue PT/OT. Pain under controlled. I reexamined her today, the leg cellulitis essentially resolved.  She previously has completed 10 days of doxycycline.  I will discontinue antibiotics.  #2.  Morbid obesity. Chronic hypoxemic respiratory  failure. Obstructive sleep apnea. COPD Chronic diastolic congestive heart failure . Condition stable.  Pending nursing home placement.  DVT prophylaxis: Lovenox Code Status: full Family Communication:  Disposition Plan:    Status is: Inpatient  Remains inpatient appropriate because:Unsafe d/c plan  Dispo: The patient is from: Home              Anticipated d/c is to: SNF              Patient currently is medically stable to d/c.   Difficult to place patient No        I/O last 3 completed shifts: In: -  Out: 1100 [Urine:1100] No intake/output data recorded.     Consultants:  Orthopedics  Procedures: None  Antimicrobials: None   Subjective:   Occasionally nauseated.  No abdominal pain. Short of breath has improved, no cough. No chest pain palpitation pain No dysuria hematuria. No fever chills Bilateral leg pain controlled.  Objective: Vitals:   07/11/21 1932 07/12/21 0500 07/12/21 0526 07/12/21 0904  BP: (!) 118/59  (!) 129/52 (!) 148/56  Pulse: 67  66 67  Resp: 20  20 20   Temp: 98.7 F (37.1 C)  98 F (36.7 C)   TempSrc: Oral  Oral   SpO2: 95%  95% 93%  Weight:  (!) 148.4 kg    Height:        Intake/Output Summary (Last 24 hours) at 07/12/2021 0912 Last data filed at 07/12/2021 0622 Gross per 24 hour  Intake --  Output 400 ml  Net -400 ml   Filed Weights   07/09/21 1530  07/11/21 0404 07/12/21 0500  Weight: (!) 152.3 kg (!) 152.7 kg (!) 148.4 kg    Examination:  General exam: Appears calm and comfortable, morbid obese. Respiratory system: Clear to auscultation. Respiratory effort normal. Cardiovascular system: S1 & S2 heard, RRR. No JVD, murmurs, rubs, gallops or clicks.  Gastrointestinal system: Abdomen is nondistended, soft and nontender. No organomegaly or masses felt. Normal bowel sounds heard. Central nervous system: Alert and oriented. No focal neurological deficits. Extremities: Left lower extremity hematoma, right leg venous stasis  ulcer.  Chronic bilateral leg edema. Skin: No rashes, lesions or ulcers Psychiatry: Judgement and insight appear normal. Mood & affect appropriate.     Data Reviewed: I have personally reviewed following labs and imaging studies  CBC: Recent Labs  Lab 07/07/21 1547 07/09/21 0429  WBC 6.4 5.5  NEUTROABS 3.7 2.4  HGB 10.7* 10.4*  HCT 32.7* 32.4*  MCV 91.6 93.4  PLT 177 427   Basic Metabolic Panel: Recent Labs  Lab 07/07/21 1547 07/09/21 0429  NA 135 138  K 4.4 4.2  CL 96* 98  CO2 36* 33*  GLUCOSE 180* 134*  BUN 35* 24*  CREATININE 1.15* 1.14*  CALCIUM 9.5 9.5  MG  --  2.0   GFR: Estimated Creatinine Clearance: 70.8 mL/min (A) (by C-G formula based on SCr of 1.14 mg/dL (H)). Liver Function Tests: Recent Labs  Lab 07/07/21 1547  AST 27  ALT 23  ALKPHOS 78  BILITOT 0.6  PROT 6.5  ALBUMIN 3.1*   No results for input(s): LIPASE, AMYLASE in the last 168 hours. No results for input(s): AMMONIA in the last 168 hours. Coagulation Profile: No results for input(s): INR, PROTIME in the last 168 hours. Cardiac Enzymes: No results for input(s): CKTOTAL, CKMB, CKMBINDEX, TROPONINI in the last 168 hours. BNP (last 3 results) No results for input(s): PROBNP in the last 8760 hours. HbA1C: No results for input(s): HGBA1C in the last 72 hours. CBG: Recent Labs  Lab 07/08/21 0542 07/08/21 0801 07/08/21 1325  GLUCAP 72 93 123*   Lipid Profile: No results for input(s): CHOL, HDL, LDLCALC, TRIG, CHOLHDL, LDLDIRECT in the last 72 hours. Thyroid Function Tests: No results for input(s): TSH, T4TOTAL, FREET4, T3FREE, THYROIDAB in the last 72 hours. Anemia Panel: No results for input(s): VITAMINB12, FOLATE, FERRITIN, TIBC, IRON, RETICCTPCT in the last 72 hours. Sepsis Labs: No results for input(s): PROCALCITON, LATICACIDVEN in the last 168 hours.  Recent Results (from the past 240 hour(s))  Resp Panel by RT-PCR (Flu A&B, Covid) Nasopharyngeal Swab     Status: None    Collection Time: 07/07/21  8:00 PM   Specimen: Nasopharyngeal Swab; Nasopharyngeal(NP) swabs in vial transport medium  Result Value Ref Range Status   SARS Coronavirus 2 by RT PCR NEGATIVE NEGATIVE Final    Comment: (NOTE) SARS-CoV-2 target nucleic acids are NOT DETECTED.  The SARS-CoV-2 RNA is generally detectable in upper respiratory specimens during the acute phase of infection. The lowest concentration of SARS-CoV-2 viral copies this assay can detect is 138 copies/mL. A negative result does not preclude SARS-Cov-2 infection and should not be used as the sole basis for treatment or other patient management decisions. A negative result may occur with  improper specimen collection/handling, submission of specimen other than nasopharyngeal swab, presence of viral mutation(s) within the areas targeted by this assay, and inadequate number of viral copies(<138 copies/mL). A negative result must be combined with clinical observations, patient history, and epidemiological information. The expected result is Negative.  Fact Sheet for  Patients:  EntrepreneurPulse.com.au  Fact Sheet for Healthcare Providers:  IncredibleEmployment.be  This test is no t yet approved or cleared by the [place] FDA and  has been authorized for detection and/or diagnosis of SARS-CoV-2 by FDA under an Emergency Use Authorization (EUA). This EUA will remain  in effect (meaning this test can be used) for the duration of the COVID-19 declaration under Section 564(b)(1) of the Act, 21 U.S.C.section 360bbb-3(b)(1), unless the authorization is terminated  or revoked sooner.       Influenza A by PCR NEGATIVE NEGATIVE Final   Influenza B by PCR NEGATIVE NEGATIVE Final    Comment: (NOTE) The Xpert Xpress SARS-CoV-2/FLU/RSV plus assay is intended as an aid in the diagnosis of influenza from Nasopharyngeal swab specimens and should not be used as a sole basis for treatment.  Nasal washings and aspirates are unacceptable for Xpert Xpress SARS-CoV-2/FLU/RSV testing.  Fact Sheet for Patients: EntrepreneurPulse.com.au  Fact Sheet for Healthcare Providers: IncredibleEmployment.be  This test is not yet approved or cleared by the [place] FDA and has been authorized for detection and/or diagnosis of SARS-CoV-2 by FDA under an Emergency Use Authorization (EUA). This EUA will remain in effect (meaning this test can be used) for the duration of the COVID-19 declaration under Section 564(b)(1) of the Act, 21 U.S.C. section 360bbb-3(b)(1), unless the authorization is terminated or revoked.  Performed at [facility], [address], [place], [place] [address]          Radiology Studies: DG Ankle 2 Views Left  Result Date: 07/12/2021 CLINICAL DATA:  70 year old female status post left ankle bimalleolar fracture. EXAM: LEFT ANKLE - 2 VIEW COMPARISON:  Left tib fib series 07/07/2021. FINDINGS: Distal left fibula metadiaphysis and left medial malleolus fractures redemonstrated with minimal displacement. On the lateral view today the distal tibia fracture is seen to involve the anterior cortex of the malleolus. The posterior malleolus appears intact. Mortise joint alignment appears maintained. No joint effusion identified. Talar dome and calcaneus appear intact. Calcaneus degenerative spurring. Generalized soft tissue swelling. IMPRESSION: Stable minimally displaced fractures of the distal left fibula and medial malleolus. Electronically Signed   By: Genevie Ann M.D.   On: 07/12/2021 08:30        Scheduled Meds:  amoxicillin-clavulanate  1 tablet Oral Q12H   aspirin EC  81 mg Oral Daily   carvedilol  25 mg Oral BID WC   cyclobenzaprine  20 mg Oral QHS   doxycycline  100 mg Oral Q12H   enoxaparin (LOVENOX) injection  75 mg Subcutaneous Q24H   escitalopram  10 mg Oral Daily   fluticasone  2 spray Each Nare Daily    gabapentin  3,000 mg Oral QHS   guaiFENesin  600 mg Oral BID   insulin pump   Subcutaneous TID WC, HS, 0200   irbesartan  75 mg Oral Daily   levothyroxine  200 mcg Oral QAC breakfast   linaclotide  145 mcg Oral QAC breakfast   magnesium oxide  400 mg Oral Daily   metolazone  5 mg Oral Once per day on Mon Thu   pantoprazole  40 mg Oral BID   pramipexole  1 mg Oral QHS   traZODone  150 mg Oral QHS   zolpidem  5 mg Oral QHS   Continuous Infusions:   LOS: 4 days    Time spent: 27 minutes    Sharen Hones, MD [facility]   To contact the attending provider between 7A-7P or the covering provider  during after hours 7P-7A, please log into the web site [email or web address] and access using universal [place] password for that web site. If you do not have the password, please call the hospital operator.  07/12/2021, 9:12 AM

## 2021-07-12 NOTE — Progress Notes (Signed)
CBG at 252: 84 no insulin self administered, patient ate banana

## 2021-07-13 DIAGNOSIS — L03115 Cellulitis of right lower limb: Secondary | ICD-10-CM | POA: Diagnosis not present

## 2021-07-13 DIAGNOSIS — I5032 Chronic diastolic (congestive) heart failure: Secondary | ICD-10-CM | POA: Diagnosis not present

## 2021-07-13 DIAGNOSIS — S82832A Other fracture of upper and lower end of left fibula, initial encounter for closed fracture: Secondary | ICD-10-CM | POA: Diagnosis not present

## 2021-07-13 NOTE — Progress Notes (Signed)
Occupational Therapy Treatment Patient Details Name: Brittney [name] MRN: [id number] DOB: 12/25/1951 Today's Date: 07/13/2021    History of present illness Pt is a 70 yo female that presented to the ED after a fall at home. 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   OT comments  Brittney [name] was seen for OT treatment on this date. Upon arrival to room pt reclined in bed reporting soiled bed and reports plan to d/c today. Pt states she will perform SPT wheelchair<>car upon d/c however has yet to be OOB with therapy services. Pt agreeable to trial. Pt demonstrates variable safety awareness t/o session - initially states she does not plan to get out of bed w/o aid at home then states she would get up to [facility] if aid is late in the morning.  Pt requires MAX A for LBD seated EOB. MIN A + significantly increased time lateral scoot bed>chair with R knee block and cues for safety (pt required ~20 mins to complete 2/2 anxiousness). Return to bed moving towards weaker L side pt requires MOD A for squat pivot t/f - cues to maintain WBing pcns.Pt making progress toward goals. Pt continues to benefit from skilled OT services to maximize return to PLOF and minimize risk of future falls, injury, caregiver burden, and readmission. Will continue to follow POC. Discharge recommendation remains appropriate as pt continues to require assist for all transfers, is not safe to return home w/o 24/7 assistance.     Follow Up Recommendations  SNF    Equipment Recommendations  None recommended by OT    Recommendations for Other Services      Precautions / Restrictions Precautions Precautions: Fall Restrictions Weight Bearing Restrictions: Yes LLE Weight  Bearing: Touchdown weight bearing       Mobility Bed Mobility Overal bed mobility: Needs Assistance Bed Mobility: Supine to Sit     Supine to sit: Min assist          Transfers Overall transfer level: Needs assistance   Transfers: Sit to/from [facility];Lateral/Scoot Transfers                Balance Overall balance assessment: Needs assistance Sitting-balance support: No upper extremity supported;Feet supported Sitting balance-Leahy Scale: Fair     Standing balance support: Bilateral upper extremity supported Standing balance-Leahy Scale: Poor                             ADL either performed or assessed with clinical judgement   ADL Overall ADL's : Needs assistance/impaired                                       General ADL Comments: MAX A for LBD seated EOB. MOD A for ADL t/f - cues to maintain WBing pcns.      Cognition Arousal/Alertness: Awake/alert Behavior During Therapy: Anxious Overall Cognitive Status: Within Functional Limits for tasks assessed                                 General Comments: Variable safety awareness t/o  session. Initiallystates she does not plan to get out of bed w/o aid at home then states she would get up to [facility] if aid is late in the morning.        Exercises Exercises: Other exercises Other Exercises Other Exercises: Pt edcuated re: OT role, DME recs, d/c recs, falls prevention, car t/f, home/routines modifications Other Exercises: LBD, sup>sit, sitting/standing balance/tolerance, lateral scoot t/f, SPT           Pertinent Vitals/ Pain       Pain Assessment: 0-10 Pain Score: 7  Pain Location: L leg, ankle Pain Descriptors / Indicators: Discomfort;Grimacing Pain Intervention(s): Limited activity within patient's tolerance;RN gave pain meds during session         Frequency  Min 1X/week        Progress Toward Goals  OT Goals(current goals can now  be found in the care plan section)  Progress towards OT goals: Progressing toward goals  Acute Rehab OT Goals Patient Stated Goal: go home OT Goal Formulation: With patient Time For Goal Achievement: 07/23/21 Potential to Achieve Goals: Good ADL Goals Pt Will Perform Lower Body Bathing: sitting/lateral leans;with min assist Pt Will Transfer to Toilet: with min assist;bedside commode;stand pivot transfer Pt Will Perform Tub/Shower Transfer: with min assist;tub bench  Plan Discharge plan remains appropriate;Frequency remains appropriate       AM-PAC OT "6 Clicks" Daily Activity     Outcome Measure   Help from another person eating meals?: A Little Help from another person taking care of personal grooming?: A Little Help from another person toileting, which includes using toliet, bedpan, or urinal?: A Lot Help from another person bathing (including washing, rinsing, drying)?: A Lot Help from another person to put on and taking off regular upper body clothing?: A Little Help from another person to put on and taking off regular lower body clothing?: A Lot 6 Click Score: 15    End of Session Equipment Utilized During Treatment: Gait belt  OT Visit Diagnosis: Unsteadiness on feet (R26.81);Other abnormalities of gait and mobility (R26.89);Repeated falls (R29.6);Muscle weakness (generalized) (M62.81);Pain Pain - Right/Left: Left Pain - part of body: Leg   Activity Tolerance Patient tolerated treatment well   Patient Left in bed;Other (comment) (with PT at bedside)   Nurse Communication Mobility status        Time: 3903-0092 OT Time Calculation (min): 56 min  Charges: OT General Charges $OT Visit: 1 Visit OT Treatments $Self Care/Home Management : 53-67 mins  Dessie Coma, M.S. OTR/L  07/13/21, 4:10 PM  ascom [phone]

## 2021-07-13 NOTE — Progress Notes (Signed)
Xray yesterday showed no change, but xray not visible to me at theis time because of network issues. Will need follow up in 10 days for repeat xray. Strongly encouraged patient to go to rehab.

## 2021-07-13 NOTE — Discharge Summary (Signed)
Physician Discharge Summary  Patient ID: Brittney [name] MRN: [id number] DOB/AGE: Jan 09, 1951 70 y.o.  Admit date: 07/07/2021 Discharge date: 07/13/2021  Admission Diagnoses:  Discharge Diagnoses:  Active Problems:   Diabetes mellitus with peripheral vascular disease ([place])   Essential hypertension   Venous stasis of both lower extremities   OSA on CPAP   Fall at home, initial encounter   CHF (congestive heart failure) (HCC)   Chronic kidney disease, stage 3a (HCC)   Left fibular fracture   Left leg cellulitis   Cellulitis of right leg Acute congestive heart failure ruled out.  Discharged Condition: fair  Hospital Course:  Brittney [name] is a 70 y.o. female with medical history significant for diabetes on an insulin pump, morbid obesity and wheelchair dependent, surgical hypothyroidism, HTN, chronic venous stasis wounds currently on Bactrim, COPD on home O2 at 3 L, OSA on CPAP, CAD, on chronic narcotics for osteoarthritic pain, history of falls presenting following a fall in which she sustained an injury to the left ankle.  She is also concerned about worsening swelling in her bilateral lower extremities and a 20 pound weight gain in spite of increases in her diuretic medication doses. Patient was giving IV Lasix offered admission to the hospital, her BNP was normal. X-ray showed left ankle fracture, she also has right leg venous stasis ulcer, with the redness, warmth and tender to touch.  She is started on oral antibiotics with doxycycline and Augmentin for cellulitis  #1.  Bilateral lower extremity venous stasis with stasis ulcers. Left ankle fracture from fall. Fall at home. Right lower extremity cellulitis secondary to MRSA. Patient is treated with oral Augmentin and doxycycline.  Prior to that, she has completed 10 days of doxycycline.  Condition has improved, currently he does not have any redness on left leg. He is also seen by Dr. Rudene Christians for left ankle fracture.  Deemed not  needing surgery. She was waiting for nursing placement over the weekend, but she is receptive to a SNF in [place], patient and daughter declined.  They wish to take patient home with home care.  I will schedule follow-up with Dr. Rudene Christians in the near future.   #2.  Morbid obesity. Chronic hypoxemic respiratory failure. Obstructive sleep apnea. COPD Chronic diastolic congestive heart failure . Condition stable.  Pending nursing home placement. Consults: cardiology  Significant Diagnostic Studies:  LEFT ANKLE - 2 VIEW   COMPARISON:  Left tib fib series 07/07/2021.   FINDINGS: Distal left fibula metadiaphysis and left medial malleolus fractures redemonstrated with minimal displacement. On the lateral view today the distal tibia fracture is seen to involve the anterior cortex of the malleolus. The posterior malleolus appears intact. Mortise joint alignment appears maintained. No joint effusion identified. Talar dome and calcaneus appear intact. Calcaneus degenerative spurring. Generalized soft tissue swelling.   IMPRESSION: Stable minimally displaced fractures of the distal left fibula and medial malleolus.     Electronically Signed   By: Genevie Ann M.D.   On: 07/12/2021 08:30    Treatments: antibiotics  Discharge Exam: Blood pressure (!) 117/51, pulse 60, temperature 98 F (36.7 C), temperature source Oral, resp. rate 16, height 5\' 6"  (1.676 m), weight (!) 148.4 kg, SpO2 96 %. General appearance: alert, cooperative, and morbid obese. Resp: clear to auscultation bilaterally Cardio: regular rate and rhythm, S1, S2 normal, no murmur, click, rub or gallop GI: soft, non-tender; bowel sounds normal; no masses,  no organomegaly Extremities: Bilateral lower extremity chronic edema, right leg venous stasis ulcer,  left leg hematoma.  Disposition: Discharge disposition: 01-Home or Self Care       Discharge Instructions     Ambulatory referral to Wound Clinic   Complete by: As  directed    Diet - low sodium heart healthy   Complete by: As directed    Discharge wound care:   Complete by: As directed    Follow with RN   Increase activity slowly   Complete by: As directed       Allergies as of 07/13/2021       Reactions   Shellfish Allergy Nausea And Vomiting, Diarrhea, Nausea Only   Non stop sickness once ingests shellfish. Betadine is okay   Cephalexin Other (See Comments)   Doesn't digest it well   Amlodipine Cough   Codeine Nausea And Vomiting, Nausea Only   states she can take codeine cough syrup without problems states she can take codeine cough syrup without problems   Imdur [isosorbide Dinitrate] Other (See Comments), Cough   Headache   Lyrica [pregabalin] Swelling, Other (See Comments)   "FLU-LIKE" SYMPTOMS   Monosodium Glutamate Other (See Comments), Cough   Headache   Tape Other (See Comments), Nausea And Vomiting   can use paper tape Redness        Medication List     STOP taking these medications    hydrALAZINE 50 MG tablet Commonly known as: APRESOLINE   ibuprofen 800 MG tablet Commonly known as: ADVIL   insulin regular human CONCENTRATED 500 UNIT/ML kwikpen Commonly known as: HUMULIN R   sulfamethoxazole-trimethoprim 800-160 MG tablet Commonly known as: BACTRIM DS   vitamin E 180 MG (400 UNITS) capsule       TAKE these medications    albuterol 108 (90 Base) MCG/ACT inhaler Commonly known as: VENTOLIN HFA Inhale 2 puffs into the lungs 3 (three) times daily as needed for wheezing or shortness of breath.   aspirin EC 81 MG tablet Take 81 mg by mouth daily. Swallow whole.   budesonide 0.5 MG/2ML nebulizer solution Commonly known as: PULMICORT Take 2 mLs by nebulization daily.   carvedilol 25 MG tablet Commonly known as: COREG Take 25 mg by mouth 2 (two) times daily with a meal.   cloNIDine 0.3 mg/24hr patch Commonly known as: CATAPRES - Dosed in mg/24 hr Place 0.3 mg onto the skin once a week.    cyclobenzaprine 10 MG tablet Commonly known as: FLEXERIL Take 20 mg by mouth at bedtime.   diclofenac Sodium 1 % Gel Commonly known as: VOLTAREN Apply topically 4 (four) times daily.   escitalopram 10 MG tablet Commonly known as: LEXAPRO Take 10 mg by mouth daily.   fluticasone 50 MCG/ACT nasal spray Commonly known as: FLONASE Place 2 sprays into both nostrils as needed.   furosemide 40 MG tablet Commonly known as: LASIX Take 40 mg by mouth daily.   gabapentin 600 MG tablet Commonly known as: NEURONTIN Take 1 tablet (600mg ) by mouth twice daily and take 3 tablets (1800mg ) by mouth at bedtime What changed:  how much to take additional instructions   insulin pump Soln Inject into the skin.   ipratropium-albuterol 0.5-2.5 (3) MG/3ML Soln Commonly known as: DUONEB Take 3 mLs by nebulization every 6 (six) hours.   Klor-Con M10 10 MEQ tablet Generic drug: potassium chloride Take 10 mEq by mouth daily.   levothyroxine 200 MCG tablet Commonly known as: SYNTHROID Take 200 mcg by mouth daily before breakfast.   Linzess 145 MCG Caps capsule Generic drug: linaclotide Take  145 mcg by mouth daily.   magnesium oxide 400 MG tablet Commonly known as: MAG-OX Take 400 mg by mouth daily.   metolazone 5 MG tablet Commonly known as: ZAROXOLYN Take 5 mg by mouth 2 (two) times a week.   ondansetron 4 MG tablet Commonly known as: ZOFRAN Take 4 mg by mouth every 12 (twelve) hours as needed for nausea or vomiting.   oxyCODONE-acetaminophen 10-325 MG tablet Commonly known as: Percocet Take 1 tablet by mouth every 8 (eight) hours as needed for pain. What changed: when to take this   pantoprazole 40 MG tablet Commonly known as: PROTONIX Take 40 mg by mouth 2 (two) times daily.   pramipexole 0.5 MG tablet Commonly known as: MIRAPEX Take 1 mg by mouth at bedtime.   telmisartan 40 MG tablet Commonly known as: MICARDIS Take 40 mg by mouth daily.   traMADol 50 MG  tablet Commonly known as: ULTRAM Take 100 mg by mouth at bedtime as needed.   traZODone 150 MG tablet Commonly known as: DESYREL Take 150 mg by mouth at bedtime.   vitamin C 500 MG tablet Commonly known as: ASCORBIC ACID Take 500 mg by mouth daily.   Vitamin D-3 125 MCG (5000 UT) Tabs Take by mouth daily at 8 pm.   Xanax 0.5 MG tablet Generic drug: ALPRAZolam Take 0.5 mg by mouth 4 (four) times daily. Take   zolpidem 10 MG tablet Commonly known as: AMBIEN Take 0.5 tablets (5 mg total) by mouth at bedtime. What changed: how much to take               Discharge Care Instructions  (From admission, onward)           Start     Ordered   07/13/21 0000  Discharge wound care:       Comments: Follow with RN   07/13/21 1244            Follow-up Information     Rusty Aus, MD Follow up in 1 week(s).   Specialty: Internal Medicine Contact information: [address] [place] [address] [phone]         Hessie Knows, MD Follow up in 1 week(s).   Specialty: Orthopedic Surgery Contact information: [place] [address] [phone]                34 minutes Signed: Sharen Hones 07/13/2021, 12:46 PM

## 2021-07-13 NOTE — Progress Notes (Signed)
OT Cancellation Note  Patient Details Name: Brittney [name] MRN: [id number] DOB: 1951-04-21   Cancelled Treatment:    Reason Eval/Treat Not Completed: Patient declined, no reason specified. Upon arrival pt reporting anxiousness (RN reports pt recently received medication). Pt agreeable to session this PM and RN notified to premedicate. Will follow up as able and pt willing to participate.   Dessie Coma, M.S. OTR/L  07/13/21, 10:40 AM  ascom [phone]

## 2021-07-13 NOTE — TOC Transition Note (Signed)
Transition of Care [facility]) - CM/SW Discharge Note   Patient Details  Name: Brittney [name] MRN: [id number] Date of Birth: [birth year]  Transition of Care [facility]) CM/SW Contact:  Beverly Sessions, RN Phone Number: 07/13/2021, 3:13 PM   Clinical Narrative:      Patient to discharge today Met with patient at bedside.  She had her daughter Brittney [name] on speaker phone Received call from [facility].  They are unable to offer a bed Bed offers presented.  Patient in not interested in pursuing either of these bed offers.  Patient states she wishes to go home, declines any DME needs, has called her nurse aide to let them know she would be going home. Wishes to resume home health orders. Daughter supports patient in this decision but expresses that she wished there were other bed offers for them to choose from  Daughter to transport at discharge Corene Cornea with [facility] notified of discharge Final next level of care: Skilled Nursing Facility Barriers to Discharge: Continued Medical Work up   Patient Goals and CMS Choice Patient states their goals for this hospitalization and ongoing recovery are:: To go to SNF per PT Recommendations. CMS Medicare.gov Compare Post Acute Care list provided to:: Patient Choice offered to / list presented to : Adult Children  Discharge Placement                       Discharge Plan and Services In-house Referral: Clinical Social Work   Post Acute Care Choice: Skilled Nursing Facility                               Social Determinants of Health (SDOH) Interventions     Readmission Risk Interventions Readmission Risk Prevention Plan 07/10/2021  Transportation Screening Complete  PCP or Specialist Appt within 3-5 Days Complete  HRI or [place] Complete  Social Work Consult for [place] Planning/Counseling Complete  Palliative Care Screening Not Applicable  Medication Review Press photographer) Complete  Some recent data  might be hidden

## 2021-07-13 NOTE — Progress Notes (Signed)
Blood glucose per pt was 52, treated and recheck at 0603 it was 89.

## 2021-07-13 NOTE — Progress Notes (Signed)
Physical Therapy Treatment Patient Details Name: Brittney [name] MRN: [id number] DOB: 1951/04/02 Today's Date: 07/13/2021    History of Present Illness Pt is a 70 yo female that presented to the ED after a fall at home. 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    PT Comments    Pt was pleasant and motivated to participate during the session. Pt required mod A to come to standing from an elevated EOB in order to comply with WB precautions.  Pt required verbal cues as well during sit to/from transfer training to ensure WB compliance.  Pt unable to make any movement towards the chair once in standing and requested to return to sitting at the EOB.  Pt education provided on importance on WB compliance to the LLE and this PT's recommendation for discharge to SNF for patient safety.  Pt understood but reiterated her plan was to discharge home.  Pt will benefit from PT services in a SNF setting upon discharge to safely address deficits listed in patient problem list for decreased caregiver assistance and eventual return to PLOF.     Follow Up Recommendations  SNF     Equipment Recommendations       Recommendations for Other Services       Precautions / Restrictions Precautions Precautions: Fall Restrictions Weight Bearing Restrictions: Yes LLE Weight Bearing: Touchdown weight bearing    Mobility  Bed Mobility Overal bed mobility: Needs Assistance Bed Mobility: Supine to Sit     Supine to sit: Min assist     General bed mobility comments: NT, pt found sitting at EOB at end of OT session    Transfers Overall transfer level: Needs assistance Equipment used: Rolling walker (2 wheeled) Transfers: Sit to/from Stand Sit  to Stand: Mod assist;From elevated surface         General transfer comment: Pt's L foot placed on top of this PT's foot to ensure WB compliance; pt required Mod A stand from an elevated surface  Ambulation/Gait             General Gait Details: Unable   Stairs             Wheelchair Mobility    Modified Rankin (Stroke Patients Only)       Balance Overall balance assessment: Needs assistance Sitting-balance support: No upper extremity supported;Feet supported Sitting balance-Leahy Scale: Fair     Standing balance support: Bilateral upper extremity supported Standing balance-Leahy Scale: Poor                              Cognition Arousal/Alertness: Awake/alert Behavior During Therapy: WFL for tasks assessed/performed Overall Cognitive Status: Within Functional Limits for tasks assessed                                 General Comments: Variable safety awareness t/o session. Initiallystates she does not plan to get out of bed w/o aid at home then states she would get up to [facility] if aid is late in the morning.      Exercises Other Exercises Other Exercises: Car transfer sequencing verbal education Other Exercises: Pt education/reinforcement on importance  of maintaining LLE WB compliance    General Comments        Pertinent Vitals/Pain Pain Assessment: 0-10 Pain Score: 7  Pain Location: L lower leg Pain Descriptors / Indicators: Aching;Sore Pain Intervention(s): Repositioned;Premedicated before session;Monitored during session    Home Living                      Prior Function            PT Goals (current goals can now be found in the care plan section) Acute Rehab PT Goals Patient Stated Goal: go home Progress towards PT goals: PT to reassess next treatment    Frequency    Min 2X/week      PT Plan Current plan remains appropriate    Co-evaluation              AM-PAC PT "6 Clicks" Mobility    Outcome Measure  Help needed turning from your back to your side while in a flat bed without using bedrails?: A Lot Help needed moving from lying on your back to sitting on the side of a flat bed without using bedrails?: A Lot Help needed moving to and from a bed to a chair (including a wheelchair)?: Total Help needed standing up from a chair using your arms (e.g., wheelchair or bedside chair)?: A Lot Help needed to walk in hospital room?: Total Help needed climbing 3-5 steps with a railing? : Total 6 Click Score: 9    End of Session Equipment Utilized During Treatment: Gait belt Activity Tolerance: Patient tolerated treatment well Patient left: in bed;with call bell/phone within reach;with bed alarm set;Other (comment) (Pt left sitting at EOB per pt request, nsg notified) Nurse Communication: Mobility status;Other (comment) (Pt sitting EOB at end of session) PT Visit Diagnosis: Other abnormalities of gait and mobility (R26.89);Pain;Muscle weakness (generalized) (M62.81) Pain - Right/Left: Left Pain - part of body: Leg;Ankle and joints of foot     Time: 1275-1700 PT Time Calculation (min) (ACUTE ONLY): 18 min  Charges:  $Therapeutic Activity: 8-22 mins                     D. Scott Danett Palazzo PT, DPT 07/13/21, 4:24 PM

## 2021-07-16 ENCOUNTER — Encounter: Payer: Self-pay | Admitting: Certified Registered Nurse Anesthetist

## 2021-07-17 ENCOUNTER — Encounter: Admission: RE | Payer: Self-pay | Source: Home / Self Care

## 2021-07-17 ENCOUNTER — Emergency Department: Payer: Medicare Other

## 2021-07-17 ENCOUNTER — Other Ambulatory Visit: Payer: Self-pay

## 2021-07-17 ENCOUNTER — Encounter: Payer: Self-pay | Admitting: Radiology

## 2021-07-17 ENCOUNTER — Ambulatory Visit: Admission: RE | Admit: 2021-07-17 | Payer: Medicare Other | Source: Home / Self Care | Admitting: Ophthalmology

## 2021-07-17 DIAGNOSIS — E1122 Type 2 diabetes mellitus with diabetic chronic kidney disease: Secondary | ICD-10-CM | POA: Insufficient documentation

## 2021-07-17 DIAGNOSIS — E039 Hypothyroidism, unspecified: Secondary | ICD-10-CM | POA: Insufficient documentation

## 2021-07-17 DIAGNOSIS — R609 Edema, unspecified: Secondary | ICD-10-CM | POA: Diagnosis not present

## 2021-07-17 DIAGNOSIS — Z7952 Long term (current) use of systemic steroids: Secondary | ICD-10-CM | POA: Diagnosis not present

## 2021-07-17 DIAGNOSIS — W1839XA Other fall on same level, initial encounter: Secondary | ICD-10-CM | POA: Insufficient documentation

## 2021-07-17 DIAGNOSIS — N1831 Chronic kidney disease, stage 3a: Secondary | ICD-10-CM | POA: Insufficient documentation

## 2021-07-17 DIAGNOSIS — J45909 Unspecified asthma, uncomplicated: Secondary | ICD-10-CM | POA: Insufficient documentation

## 2021-07-17 DIAGNOSIS — Z794 Long term (current) use of insulin: Secondary | ICD-10-CM | POA: Diagnosis not present

## 2021-07-17 DIAGNOSIS — Z79899 Other long term (current) drug therapy: Secondary | ICD-10-CM | POA: Insufficient documentation

## 2021-07-17 DIAGNOSIS — I130 Hypertensive heart and chronic kidney disease with heart failure and stage 1 through stage 4 chronic kidney disease, or unspecified chronic kidney disease: Secondary | ICD-10-CM | POA: Diagnosis not present

## 2021-07-17 DIAGNOSIS — T1490XA Injury, unspecified, initial encounter: Secondary | ICD-10-CM

## 2021-07-17 DIAGNOSIS — I509 Heart failure, unspecified: Secondary | ICD-10-CM | POA: Insufficient documentation

## 2021-07-17 DIAGNOSIS — Z7982 Long term (current) use of aspirin: Secondary | ICD-10-CM | POA: Insufficient documentation

## 2021-07-17 DIAGNOSIS — N179 Acute kidney failure, unspecified: Secondary | ICD-10-CM | POA: Insufficient documentation

## 2021-07-17 DIAGNOSIS — Z609 Problem related to social environment, unspecified: Secondary | ICD-10-CM | POA: Diagnosis not present

## 2021-07-17 DIAGNOSIS — Z96652 Presence of left artificial knee joint: Secondary | ICD-10-CM | POA: Insufficient documentation

## 2021-07-17 DIAGNOSIS — Z85828 Personal history of other malignant neoplasm of skin: Secondary | ICD-10-CM | POA: Insufficient documentation

## 2021-07-17 DIAGNOSIS — S99911A Unspecified injury of right ankle, initial encounter: Secondary | ICD-10-CM | POA: Diagnosis not present

## 2021-07-17 DIAGNOSIS — S99912A Unspecified injury of left ankle, initial encounter: Secondary | ICD-10-CM | POA: Diagnosis not present

## 2021-07-17 LAB — BASIC METABOLIC PANEL
Anion gap: 7 (ref 5–15)
BUN: 55 mg/dL — ABNORMAL HIGH (ref 8–23)
CO2: 29 mmol/L (ref 22–32)
Calcium: 10.6 mg/dL — ABNORMAL HIGH (ref 8.9–10.3)
Chloride: 97 mmol/L — ABNORMAL LOW (ref 98–111)
Creatinine, Ser: 1.53 mg/dL — ABNORMAL HIGH (ref 0.44–1.00)
GFR, Estimated: 37 mL/min — ABNORMAL LOW (ref >60)
Glucose, Bld: 143 mg/dL — ABNORMAL HIGH (ref 70–99)
Potassium: 4.2 mmol/L (ref 3.5–5.1)
Sodium: 133 mmol/L — ABNORMAL LOW (ref 135–145)

## 2021-07-17 LAB — CBC
HCT: 33.6 % — ABNORMAL LOW (ref 36.0–46.0)
Hemoglobin: 11.1 g/dL — ABNORMAL LOW (ref 12.0–15.0)
MCH: 30.7 pg (ref 26.0–34.0)
MCHC: 33 g/dL (ref 30.0–36.0)
MCV: 93.1 fL (ref 80.0–100.0)
Platelets: 252 10*3/uL (ref 150–400)
RBC: 3.61 MIL/uL — ABNORMAL LOW (ref 3.87–5.11)
RDW: 13.8 % (ref 11.5–15.5)
WBC: 11.5 10*3/uL — ABNORMAL HIGH (ref 4.0–10.5)
nRBC: 0 % (ref 0.0–0.2)

## 2021-07-17 SURGERY — PHACOEMULSIFICATION, CATARACT, WITH IOL INSERTION
Anesthesia: Topical | Laterality: Right

## 2021-07-17 MED ORDER — MOXIFLOXACIN HCL 0.5 % OP SOLN: 1.0000 [drp] | OPHTHALMIC | Status: AC | PRN

## 2021-07-17 MED ORDER — ARMC OPHTHALMIC DILATING DROPS: 1.0000 "application " | OPHTHALMIC | Status: AC

## 2021-07-17 MED ORDER — TETRACAINE HCL 0.5 % OP SOLN: 1.0000 [drp] | Freq: Once | OPHTHALMIC | Status: DC

## 2021-07-17 MED ORDER — FENTANYL CITRATE (PF) 100 MCG/2ML IJ SOLN
INTRAMUSCULAR | Status: AC
  Filled 2021-07-17: qty 2

## 2021-07-17 MED ORDER — MOXIFLOXACIN HCL 0.5 % OP SOLN: 1.0000 [drp] | OPHTHALMIC | Status: AC

## 2021-07-17 MED ORDER — SODIUM CHLORIDE 0.9 % IV SOLN: INTRAVENOUS | Status: DC

## 2021-07-17 NOTE — ED Triage Notes (Addendum)
Pt states she was told "I was not acting right today, I was perturbed and they told me if f I didn't come to the hospital bad things would happen". Pt states she has been falling, is weak. Pt with history of diabetes and states she recently  left hospital for left foot infection. Pt states she has fallen today injuring bilateral ankles and feet.

## 2021-07-18 ENCOUNTER — Emergency Department
Admission: EM | Admit: 2021-07-18 | Discharge: 2021-07-18 | Disposition: A | Discharge status: Home / Self Care | Payer: Medicare Other | Attending: Emergency Medicine | Admitting: Emergency Medicine

## 2021-07-18 DIAGNOSIS — N179 Acute kidney failure, unspecified: Secondary | ICD-10-CM

## 2021-07-18 DIAGNOSIS — T1490XA Injury, unspecified, initial encounter: Secondary | ICD-10-CM

## 2021-07-18 DIAGNOSIS — Z659 Problem related to unspecified psychosocial circumstances: Secondary | ICD-10-CM

## 2021-07-18 NOTE — ED Notes (Signed)
Patient verbalizes understanding of discharge instructions. Opportunity for questioning and answers were provided. Armband removed by staff, pt discharged from ED. Ambulated out to lobby  

## 2021-07-18 NOTE — ED Provider Notes (Addendum)
[facility] Emergency Department Provider Note  ____________________________________________   Event Date/Time   First MD Initiated Contact with Patient 07/18/21 [phone]     (approximate)  I have reviewed the triage vital signs and the nursing notes.   HISTORY  Chief Complaint Weakness   HPI Brittney [name] is a 70 y.o. female with medical history significant for diabetes on an insulin pump, morbid obesity and wheelchair dependent, surgical hypothyroidism, HTN, chronic venous stasis wounds currently on Bactrim, COPD on home O2 at 3 L, OSA on CPAP, CAD, on chronic narcotics for osteoarthritic pain, history of falls and recent admission 7/12-7/15 for treatment evaluation of bilateral extremity stasis ulcers and left ankle fracture as well as cellulitis of the right lower extremity.  Patient states he is going emergency room because someone called social services on her because they are worried about her.  She states that she feels been doing okay at home where she has a nursing aide come from home Monday through Friday and otherwise her daughter will be at home helping her take care of herself.  She states she fell 3 to 4 days ago just on her lower back but has no pain from this area.  This occurred while transferring between the chair and the bed.  She has no new pain in her extremities and denies any fevers, chest pain, cough, shortness of breath, nausea, vomiting, diarrhea, dysuria, rash or any other acute sick symptoms.  She states she wished to go home and is not sure why she was brought to emergency room.  She states she has been taking all her medicines although is no longer currently prescribed antibiotics.         Past Medical History:  Diagnosis Date   Abdominal pain, left upper quadrant 12/11/2012   Anemia    Arthritis    Asthma    Broken leg 2014   fell twice and broke same leg. had a bone stimulator for first break.   Cancer ([place]) 2007   melanoma,  left ear and back of left leg, removed from back   Chronic kidney disease, stage 2 (mild)    Chronic lower back pain    Collagen vascular disease (HCC)    Complication of anesthesia    slow to wake, coughing   Coronary artery disease    Diabetes mellitus without complication ([place])    Diabetic nephropathy associated with secondary diabetes mellitus ([place])    Flushing 12/11/2012   Gastroparesis    Gross hematuria 12/11/2012   Hepatic cirrhosis ([place])    History of kidney stones    Hyperlipidemia    Hypertension    Hypothyroidism    Hypothyroidism    IBS (irritable bowel syndrome)    Kidney stone 12/11/2012   Lower extremity edema    Morbid obesity with BMI of 40.0-44.9, adult (HCC)    Motion sickness    back seat of cars   MRSA (methicillin resistant staph aureus) culture positive 2016   Myocardial infarction ([place]) 09/2013   Nausea without vomiting 12/11/2012   Nephrolithiasis    Nephrolithiasis 04/26/2014   Nonproliferative retinopathy due to secondary diabetes (HCC)    Numbness and tingling of right leg    Peripheral vascular disease ([place])    Renal colic [id number]   Renal insufficiency    Sciatica    Sciatica 12/11/2012   Skin cancer 08/2018   Sleep apnea    USES CPAP   Spinal cord stimulator status    battery  in left buttock   Stroke ([place]) 07/2020   Unilateral small kidney without contralateral hypertrophy    Wears dentures    full lower   Wheelchair dependent    reports able to transfer with assist    Patient Active Problem List   Diagnosis Date Noted   Cellulitis of right leg 07/09/2021   Left leg cellulitis 07/08/2021   CHF (congestive heart failure) ([place]) 07/07/2021   Chronic kidney disease, stage 3a ([place]) 07/07/2021   Left fibular fracture 07/07/2021   Bilateral lower leg cellulitis 08/18/2020   Rhabdomyolysis 08/18/2020   Fall at home, initial encounter 08/18/2020   Fall 08/18/2020   Primary osteoarthritis of left knee 11/26/2019   Sepsis ([place])  07/30/2019   Type 2 diabetes mellitus with diabetic polyneuropathy, with long-term current use of insulin ([place]) 07/03/2019   OSA on CPAP 01/24/2019   Lymphedema 09/03/2018   Venous stasis of both lower extremities 09/03/2018   Status post lumbar spine surgery for decompression of spinal cord 03/30/2018   Myofascial pain 03/30/2018   Spinal stenosis of thoracic region 03/30/2018   Back pain 11/02/2017   Iron deficiency anemia 05/10/2017   Benign essential hypertension 03/10/2017   Long term prescription benzodiazepine use 01/18/2017   Vitamin D insufficiency 01/18/2017   Neurogenic pain 01/17/2017   L3-4 severe lumbar facet hypertrophy and spinal stenosis 01/10/2017   Diabetes with retinopathy ([place]) 12/07/2016   Diabetic nephropathy ([place]) 12/07/2016   Gastroparesis due to DM ([place]) 12/07/2016   Long term current use of opiate analgesic 12/07/2016   Long term prescription opiate use 12/07/2016   Opiate use 12/07/2016   Chronic pain syndrome 12/07/2016   Chronic low back pain (Location of Primary Source of Pain) (Bilateral) (R>L) 12/07/2016   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/07/2016   Chronic lower extremity pain (Location of Secondary source of pain) (Bilateral) (R>L) 12/07/2016   Chronic knee pain (Location of Tertiary source of pain) (Right) 12/07/2016   Osteoarthritis of knee (Right) 12/07/2016   Grade 1 Anterolisthesis of L3 over L4 and L4 over L5 12/07/2016   Osteoarthritis of sacroiliac joint (Right) 12/07/2016   Generalized osteoarthritis of multiple sites 10/22/2016   Degenerative spondylolisthesis 09/08/2016   L3-4 severe lumbar spinal stenosis (12/31/2016 MRI) 09/08/2016   Lung nodule, multiple 08/31/2016   Hepatic cirrhosis ([place]) 06/20/2016   Fibromyalgia 04/15/2016   Seronegative arthritis 04/15/2016   Diabetes mellitus with peripheral vascular disease ([place]) 03/16/2016   Type 2 diabetes mellitus with both eyes affected by mild nonproliferative retinopathy  without macular edema, with long-term current use of insulin ([place]) 03/16/2016   Morbid obesity with BMI of 50.0-59.9, adult ([place]) 03/16/2016   Type 2 diabetes mellitus with diabetic nephropathy, with long-term current use of insulin ([place]) 03/16/2016   Microalbuminuria 02/20/2016   Essential hypertension 01/27/2016   DM (diabetes mellitus) type II uncontrolled, periph vascular disorder ([place]) 04/26/2014   Hyperlipemia 04/26/2014   OSA (obstructive sleep apnea) 04/26/2014   Abnormal tumor markers 12/11/2012   Chronic kidney disease, stage II (mild) 12/11/2012   Unilateral small kidney 12/11/2012    Past Surgical History:  Procedure Laterality Date   ABDOMINAL HYSTERECTOMY     BACK SURGERY  04/1999   L-4-5 LAMINECTOMY. metal in lower back and neck   [place]  05/04/2001   COLONOSCOPY WITH PROPOFOL N/A 09/20/2016   Procedure: COLONOSCOPY WITH PROPOFOL;  Surgeon: Lollie Sails, MD;  Location[facility] ENDOSCOPY;  Service: Endoscopy;  Laterality: N/A;   DILATION AND CURETTAGE OF UTERUS     ESOPHAGOGASTRODUODENOSCOPY  02/08/2014   ESOPHAGOGASTRODUODENOSCOPY (EGD) WITH PROPOFOL N/A 09/20/2016   Procedure: ESOPHAGOGASTRODUODENOSCOPY (EGD) WITH PROPOFOL;  Surgeon: Lollie Sails, MD;  Location: [facility] ENDOSCOPY;  Service: Endoscopy;  Laterality: N/A;   ESOPHAGOGASTRODUODENOSCOPY (EGD) WITH PROPOFOL N/A 05/23/2018   Procedure: ESOPHAGOGASTRODUODENOSCOPY (EGD) WITH PROPOFOL;  Surgeon: Lollie Sails, MD;  Location: [facility] ENDOSCOPY;  Service: Endoscopy;  Laterality: N/A;   ESOPHAGOGASTRODUODENOSCOPY (EGD) WITH PROPOFOL N/A 07/18/2020   Procedure: ESOPHAGOGASTRODUODENOSCOPY (EGD) WITH PROPOFOL;  Surgeon: Lesly Rubenstein, MD;  Location: [facility] ENDOSCOPY;  Service: Gastroenterology;  Laterality: N/A;   EYE SURGERY  1986   FOR MELANOMA   HERNIA REPAIR     JOINT REPLACEMENT Left 09/24/2013   TOTAL KNEE   MOHS SURGERY  11/2016   left side of nose    PULSE GENERATOR IMPLANT N/A 11/02/2017   Procedure: UNILATERAL PULSE GENERATOR IMPLANT;  Surgeon: Meade Maw, MD;  Location: [facility] ORS;  Service: Neurosurgery;  Laterality: N/A;   TONSILLECTOMY     WITH ADENOIDECTOMY    Prior to Admission medications   Medication Sig Start Date End Date Taking? Authorizing Provider  albuterol (VENTOLIN HFA) 108 (90 Base) MCG/ACT inhaler Inhale 2 puffs into the lungs 3 (three) times daily as needed for wheezing or shortness of breath.     [provider]  ALPRAZolam Duanne Moron) 0.5 MG tablet Take 0.5 mg by mouth 4 (four) times daily. Take    [provider]  aspirin EC 81 MG tablet Take 81 mg by mouth daily. Swallow whole.    [provider]  budesonide (PULMICORT) 0.5 MG/2ML nebulizer solution Take 2 mLs by nebulization daily. 07/25/20   [provider]  carvedilol (COREG) 25 MG tablet Take 25 mg by mouth 2 (two) times daily with a meal.    [provider]  Cholecalciferol (VITAMIN D-3) 125 MCG (5000 UT) TABS Take by mouth daily at 8 pm.    [provider]  cloNIDine (CATAPRES - DOSED IN MG/24 HR) 0.3 mg/24hr patch Place 0.3 mg onto the skin once a week.     [provider]  cyclobenzaprine (FLEXERIL) 10 MG tablet Take 20 mg by mouth at bedtime. 12/19/19   [provider]  diclofenac Sodium (VOLTAREN) 1 % GEL Apply topically 4 (four) times daily.    [provider]  escitalopram (LEXAPRO) 10 MG tablet Take 10 mg by mouth daily.  12/13/12   [provider]  fluticasone (FLONASE) 50 MCG/ACT nasal spray Place 2 sprays into both nostrils as needed.     [provider]  furosemide (LASIX) 40 MG tablet Take 40 mg by mouth daily. 08/14/20   [provider]  gabapentin (NEURONTIN) 600 MG tablet Take 1 tablet ('600mg'$ ) by mouth twice daily and take 3 tablets ('1800mg'$ ) by mouth at bedtime 08/21/20   Mercy Riding, MD  Insulin Human (INSULIN PUMP) SOLN Inject into the  skin.    [provider]  ipratropium-albuterol (DUONEB) 0.5-2.5 (3) MG/3ML SOLN Take 3 mLs by nebulization every 6 (six) hours. 07/25/20   [provider]  KLOR-CON M10 10 MEQ tablet Take 10 mEq by mouth daily. 06/09/21   [provider]  levothyroxine (SYNTHROID) 200 MCG tablet Take 200 mcg by mouth daily before breakfast.    [provider]  linaclotide (LINZESS) 145 MCG CAPS capsule Take 145 mcg by mouth daily.    [provider]  magnesium oxide (MAG-OX) 400  MG tablet Take 400 mg by mouth daily.    [provider]  metolazone (ZAROXOLYN) 5 MG tablet Take 5 mg by mouth 2 (two) times a week. 01/24/20 07/07/21  [provider]  ondansetron (ZOFRAN) 4 MG tablet Take 4 mg by mouth every 12 (twelve) hours as needed for nausea or vomiting.  05/14/19   [provider]  oxyCODONE-acetaminophen (PERCOCET) 10-325 MG tablet Take 1 tablet by mouth every 8 (eight) hours as needed for pain. 06/14/21 08/13/21  [name], MD  pantoprazole (PROTONIX) 40 MG tablet Take 40 mg by mouth 2 (two) times daily.     [provider]  pramipexole (MIRAPEX) 0.5 MG tablet Take 1 mg by mouth at bedtime.    [provider]  telmisartan (MICARDIS) 40 MG tablet Take 40 mg by mouth daily. 08/14/20   [provider]  traMADol (ULTRAM) 50 MG tablet Take 100 mg by mouth at bedtime as needed.     [provider]  traZODone (DESYREL) 150 MG tablet Take 150 mg by mouth at bedtime.  12/11/12   [provider]  vitamin C (ASCORBIC ACID) 500 MG tablet Take 500 mg by mouth daily.    [provider]  zolpidem (AMBIEN) 10 MG tablet Take 0.5 tablets (5 mg total) by mouth at bedtime. 08/21/20   Mercy Riding, MD    Allergies Shellfish allergy, Cephalexin, Amlodipine, Codeine, Imdur [isosorbide dinitrate], Lyrica [pregabalin], Monosodium glutamate, and Tape  Family History  Problem Relation Age of Onset   Heart disease  Mother    Cancer Mother    Asthma Mother    Diabetes Father    Kidney disease Father    Hypertension Father    Breast cancer Neg Hx     Social History Social History   Tobacco Use   Smoking status: Never   Smokeless tobacco: Never  Vaping Use   Vaping Use: Never used  Substance Use Topics   Alcohol use: No   Drug use: Never    Review of Systems  Review of Systems  Constitutional:  Negative for chills and fever.  HENT:  Negative for sore throat.   Eyes:  Negative for pain.  Respiratory:  Negative for cough and stridor.   Cardiovascular:  Negative for chest pain.  Gastrointestinal:  Negative for vomiting.  Genitourinary:  Negative for dysuria.  Musculoskeletal:  Positive for back pain (chronic in mid back), falls and joint pain (chronic in b/l ankles).  Skin:  Negative for rash.  Neurological:  Positive for weakness (chronic, wheelchair bound). Negative for seizures, loss of consciousness and headaches.  Psychiatric/Behavioral:  Negative for suicidal ideas.   All other systems reviewed and are negative.    ____________________________________________   PHYSICAL EXAM:  VITAL SIGNS: ED Triage Vitals [07/17/21 1937]  Enc Vitals Group     BP 140/62     Pulse Rate 74     Resp 16     Temp 98.4 F (36.9 C)     Temp Source Oral     SpO2 94 %     Weight (!) 326 lb 4.5 oz (148 kg)     Height '5\' 6"'$  (1.676 m)     Head Circumference      Peak Flow      Pain Score 9     Pain Loc      Pain Edu?      Excl. in [place]?    Vitals:   07/17/21 1937 07/18/21 0342  BP: 140/62 Marland Kitchen)  144/57  Pulse: 74 69  Resp: 16 18  Temp: 98.4 F (36.9 C) 97.9 F (36.6 C)  SpO2: 94% 93%   Physical Exam Vitals and nursing note reviewed.  Constitutional:      General: She is not in acute distress.    Appearance: She is well-developed. She is obese.  HENT:     Head: Normocephalic and atraumatic.     Right Ear: External ear normal.     Left Ear: External ear normal.     Nose: Nose  normal.     Mouth/Throat:     Mouth: Mucous membranes are dry.  Eyes:     Conjunctiva/sclera: Conjunctivae normal.  Cardiovascular:     Rate and Rhythm: Normal rate and regular rhythm.     Pulses: Normal pulses.     Heart sounds: No murmur heard. Pulmonary:     Effort: Pulmonary effort is normal. No respiratory distress.     Breath sounds: Normal breath sounds.  Abdominal:     Palpations: Abdomen is soft.     Tenderness: There is no abdominal tenderness.  Musculoskeletal:     Cervical back: Neck supple.     Right lower leg: Edema present.     Left lower leg: Edema present.  Skin:    General: Skin is warm and dry.     Capillary Refill: Capillary refill takes less than 2 seconds.  Neurological:     Mental Status: She is alert and oriented to person, place, and time.  Psychiatric:        Mood and Affect: Mood normal.    2+ bilateral DP pulses.  Patient is able to move her toes on command of both lower extremities.  Fairly significant lower extremity edema.  No hematoma.  Appropriately over the left anterior lower leg.  The right medial right lower leg the patient which particular in place with tenderness at this time.  No proximal or distal erythema redness swelling or tenderness palpation of skin proximally distally or through the dressing.  No significant tenderness over the L-spine or bilateral CVA areas.  Cranial nerves II through XII grossly intact.  Patient has symmetric strength in upper extremities.  She is oriented x4. ____________________________________________   LABS (all labs ordered are listed, but only abnormal results are displayed)  Labs Reviewed  BASIC METABOLIC PANEL - Abnormal; Notable for the following components:      Result Value   Sodium 133 (*)    Chloride 97 (*)    Glucose, Bld 143 (*)    BUN 55 (*)    Creatinine, Ser 1.53 (*)    Calcium 10.6 (*)    GFR, Estimated 37 (*)    All other components within normal limits  CBC - Abnormal; Notable for  the following components:   WBC 11.5 (*)    RBC 3.61 (*)    Hemoglobin 11.1 (*)    HCT 33.6 (*)    All other components within normal limits  URINALYSIS, COMPLETE (UACMP) WITH MICROSCOPIC   ____________________________________________  EKG  Sinus rhythm with ventricular rate of 70, first-degree AV block and isolated nonspecific change in lead III without any other clear evidence of acute ischemia or significant arrhythmia.  ____________________________________________  RADIOLOGY  ED MD interpretation: Plain film of the left ankle shows no new fracture dislocation but previously mildly displaced seen oblique fracture of distal fibula.  There is also some swelling of the dorsum of the forefoot.  Plain film of the right ankle shows chronic fracture  of the right lateral malleolus and some mild soft tissue swelling.  No clear acute fracture.  Plain film of the left foot shows mildly displaced oblique fracture distal fibula again but no fracture.  Plain film of the right foot shows some lucencies around the fourth and fifth phalanx concerning for possible fracture of unclear chronicity.  Official radiology report(s): DG Ankle Left Port  Result Date: 07/17/2021 CLINICAL DATA:  70 year old female with trauma to the left lower extremity. EXAM: PORTABLE LEFT ANKLE - 2 VIEW; LEFT FOOT - COMPLETE 3+ VIEW COMPARISON:  Left ankle radiograph dated 07/07/2021. FINDINGS: Mildly displaced oblique fracture of the distal fibula as seen on the prior radiograph. No new fracture. The bones are osteopenic. There is diffuse soft tissue swelling of the foot predominantly over the dorsum of the forefoot. No radiopaque foreign object or soft tissue gas. IMPRESSION: 1. Mildly displaced oblique fracture of the distal fibula, similar to prior radiograph. No new fracture. 2. Soft tissue swelling of the dorsum of the forefoot. Electronically Signed   By: Anner Crete M.D.   On: 07/17/2021 20:57   DG Ankle Right  Port  Result Date: 07/17/2021 CLINICAL DATA:  Weakness and recent falls. EXAM: PORTABLE RIGHT ANKLE - 2 VIEW COMPARISON:  July 07, 2021 and October 22, 2014 FINDINGS: The right ankle was imaged in a partially radiopaque bandage with subsequently obscured osseous and soft tissue detail. There is no evidence of an acute fracture, dislocation, or joint effusion. A chronic fracture deformity is seen involving the right lateral malleolus. A moderate sized plantar calcaneal spur is noted. Mild degenerative changes are seen along the dorsal aspect of the mid right foot. There is stable diffuse soft tissue swelling. IMPRESSION: 1. Chronic fracture of the right lateral malleolus. 2. Diffuse soft tissue swelling which is likely, in part, secondary to the patient's body habitus. Electronically Signed   By: Virgina Norfolk M.D.   On: 07/17/2021 21:00   DG Foot Complete Left  Result Date: 07/17/2021 CLINICAL DATA:  70 year old female with trauma to the left lower extremity. EXAM: PORTABLE LEFT ANKLE - 2 VIEW; LEFT FOOT - COMPLETE 3+ VIEW COMPARISON:  Left ankle radiograph dated 07/07/2021. FINDINGS: Mildly displaced oblique fracture of the distal fibula as seen on the prior radiograph. No new fracture. The bones are osteopenic. There is diffuse soft tissue swelling of the foot predominantly over the dorsum of the forefoot. No radiopaque foreign object or soft tissue gas. IMPRESSION: 1. Mildly displaced oblique fracture of the distal fibula, similar to prior radiograph. No new fracture. 2. Soft tissue swelling of the dorsum of the forefoot. Electronically Signed   By: Anner Crete M.D.   On: 07/17/2021 20:57   DG Foot Complete Right  Result Date: 07/17/2021 CLINICAL DATA:  Fall, no diabetes and recent left foot infection. EXAM: RIGHT FOOT COMPLETE - 3+ VIEW COMPARISON:  Radiograph 07/07/2021 FINDINGS: Overlying bandaging material is present which may obscure fine bone and soft-tissue detail. Evaluation further  limited by diffuse bony demineralization. There is extensive soft tissue swelling. No visible radiopaque foreign body. Irregularity of the first digit nail plate, correlate with visual inspection. Age-indeterminate fracture involving the lateral base of the first proximal phalanx. Additional lucency/fracture lines through the head of the fourth and fifth proximal phalanges as well as the fifth middle and distal phalanges as well. No acute osseous erosion or destructive changes are discernible to suggest the presence of acute osteomyelitis. Diffuse degenerative changes throughout the foot and bidirectional calcaneal spurs are present.  IMPRESSION: 1. Technically challenging exam due to overlying bandaging material and diffuse bony demineralization which may limit detection of subtle osseous abnormalities. 2. Diffuse soft tissue swelling. No clear radiographic evidence of osteomyelitis. 3. Subacute appearing fracture involving the lateral base of the first distal phalanx, present on comparison radiograph. 4. Additional suspicious lucencies extending through the fourth and fifth proximal phalanges as well as the fifth middle and distal phalanges possibly reflecting additional sites of fracture. Correlate for point tenderness. 5. Diffuse degenerative changes throughout the foot. 6. Bidirectional calcaneal spurs. Electronically Signed   By: Lovena Le M.D.   On: 07/17/2021 21:01    ____________________________________________   PROCEDURES  Procedure(s) performed (including Critical Care):  Procedures   ____________________________________________   INITIAL IMPRESSION / ASSESSMENT AND PLAN / ED COURSE      Patient presents with above to history exam for assessment after reportedly having Adult Protective Services and social services called EMS because someone was concerned about her wellbeing.  Patient states she is not sure why this is the case that she states she feels she has been doing fairly well  other than a fall 3 or 4 days ago just onto her lower back.  She states at that time she not hit her head has no residual pain or other discomfort other than some chronic pain in her back from before the fall and some chronic pain in her ankles from injuries prior to the fall as well.  She denies any other acute sick symptoms.  On arrival she is afebrile hemodynamically stable.  She states she wishes to be discharged and does not want to spend any more time in the emergency room as she does not think there is anything wrong with her.  Does appear awake and alert and oriented and on exam she appears neurovascularly intact in extremities without overt evidence of cellulitis or clear deep space infection.  Compartments are soft in bilateral extremities and not consistent with compartment syndrome.   Plain film of the left ankle shows no new fracture dislocation but previously mildly displaced seen oblique fracture of distal fibula.  There is also some swelling of the dorsum of the forefoot.  Plain film of the right ankle shows chronic fracture of the right lateral malleolus and some mild soft tissue swelling.  No clear acute fracture.  Plain film of the left foot shows mildly displaced oblique fracture distal fibula again but no fracture.  Plain film of the right foot shows some lucencies around the fourth and fifth phalanx concerning for possible fracture of unclear chronicity.  ECG and absence of chest pain shortness of breath nausea abdominal pain vomiting or any other acute sick symptoms is not suggestive of ACS myocarditis or significant arrhythmia.  BMP shows mild AKI with a creatinine of 1.53 compared to 1.149 days ago without other significant electrode or metabolic derangements.  CBC shows somewhat nonspecific leukocytosis with WBC count of 11.5 without evidence of acute ischemia.  Patient denies any respiratory symptoms with no fever or abnormal breath sounds to suggest pneumonia lower extremity exam  does not show findings consistent with acute cellulitis or deeper space infection at this time.  Advised patient that she appeared dehydrated and recommended she adequate hydrate with fluids over the next 2 or 3 days and have her kidney function rechecked again.  She is amenable with plan.  Discharged in stable condition.  Strict return cautions advised and discussed with     ____________________________________________   FINAL CLINICAL IMPRESSION(S) /  ED DIAGNOSES  Final diagnoses:  Injury  Concerned about having social problem  AKI (acute kidney injury) ([place])    Medications - No data to display   ED Discharge Orders     None        Note:  This document was prepared using Dragon voice recognition software and may include unintentional dictation errors.    Lucrezia Starch, MD 07/18/21 [id number]    Lucrezia Starch, MD 07/18/21 [phone]

## 2021-07-18 NOTE — ED Notes (Signed)
Pt verbalizes understanding of d/c instructions and follow up. 

## 2021-07-18 NOTE — ED Notes (Signed)
Pt incontinent of urine. Pt cleaned with warm wipes and clean brief and clothing placed on pt. Pt transferred to a wheelchair and wheeled out to the lobby to wait on her daughter. Warm blankets was provided to pt.

## 2021-07-30 ENCOUNTER — Encounter: Payer: Medicare Other | Attending: Physician Assistant | Admitting: Physician Assistant

## 2021-07-30 ENCOUNTER — Other Ambulatory Visit: Payer: Self-pay

## 2021-07-30 DIAGNOSIS — W2203XA Walked into furniture, initial encounter: Secondary | ICD-10-CM | POA: Insufficient documentation

## 2021-07-30 DIAGNOSIS — I129 Hypertensive chronic kidney disease with stage 1 through stage 4 chronic kidney disease, or unspecified chronic kidney disease: Secondary | ICD-10-CM | POA: Insufficient documentation

## 2021-07-30 DIAGNOSIS — L97819 Non-pressure chronic ulcer of other part of right lower leg with unspecified severity: Secondary | ICD-10-CM | POA: Insufficient documentation

## 2021-07-30 DIAGNOSIS — S8011XA Contusion of right lower leg, initial encounter: Secondary | ICD-10-CM | POA: Diagnosis present

## 2021-07-30 DIAGNOSIS — S8012XA Contusion of left lower leg, initial encounter: Secondary | ICD-10-CM | POA: Diagnosis not present

## 2021-07-30 DIAGNOSIS — E11622 Type 2 diabetes mellitus with other skin ulcer: Secondary | ICD-10-CM | POA: Insufficient documentation

## 2021-07-30 DIAGNOSIS — J449 Chronic obstructive pulmonary disease, unspecified: Secondary | ICD-10-CM | POA: Diagnosis not present

## 2021-07-30 DIAGNOSIS — L97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 necrosis of muscle: Secondary | ICD-10-CM | POA: Diagnosis not present

## 2021-07-30 DIAGNOSIS — E1122 Type 2 diabetes mellitus with diabetic chronic kidney disease: Secondary | ICD-10-CM | POA: Diagnosis not present

## 2021-07-30 DIAGNOSIS — Z794 Long term (current) use of insulin: Secondary | ICD-10-CM | POA: Insufficient documentation

## 2021-07-30 DIAGNOSIS — N182 Chronic kidney disease, stage 2 (mild): Secondary | ICD-10-CM | POA: Diagnosis not present

## 2021-07-30 NOTE — Progress Notes (Addendum)
Brittney [name], Brittney [name] ([id number]) Visit Report for 07/30/2021 Arrival Information Details Patient Name: Brittney [name], Brittney [name]. Date of Service: 07/30/2021 3:30 PM Medical Record Number: [id number] Patient Account Number: [id number] Date of Birth/Sex: 1951-11-21 (70 y.o. F) Treating RN: Dolan Amen Primary Care Drey Shaff: Emily Filbert Other Clinician: Referring Tiago Humphrey: Emily Filbert Treating Dabney Dever/Extender: Skipper Cliche in Treatment: 5 Visit Information History Since Last Visit Pain Present Now: Yes Patient Arrived: Wheel Chair Arrival Time: 16:08 Accompanied By: daughter Transfer Assistance: None Patient Identification Verified: Yes Secondary Verification Process Completed: Yes Patient Has Alerts: Yes Patient Alerts: Type II Diabetic Electronic Signature(s) Signed: 07/31/2021 8:14:09 AM By: Dolan Amen RN Entered By: Dolan Amen on 07/30/2021 16:15:18 Brittney [name] ([id number]) -------------------------------------------------------------------------------- Clinic Level of Care Assessment Details Patient Name: Brittney Gentles B. Date of Service: 07/30/2021 3:30 PM Medical Record Number: [id number] Patient Account Number: [id number] Date of Birth/Sex: 12-29-1950 (70 y.o. F) Treating RN: Dolan Amen Primary Care Karisa Nesser: Emily Filbert Other Clinician: Referring Catlin Doria: Emily Filbert Treating Cyle Kenyon/Extender: Skipper Cliche in Treatment: 5 Clinic Level of Care Assessment Items TOOL 1 Quantity Score '[]'  - Use when EandM and Procedure is performed on INITIAL visit 0 ASSESSMENTS - Nursing Assessment / Reassessment '[]'  - General Physical Exam (combine w/ comprehensive assessment (listed just below) when performed on new 0 pt. evals) '[]'  - 0 Comprehensive Assessment (HX, ROS, Risk Assessments, Wounds Hx, etc.) ASSESSMENTS - Wound and Skin Assessment / Reassessment '[]'  - Dermatologic / Skin Assessment (not related to wound area) 0 ASSESSMENTS - Ostomy and/or Continence  Assessment and Care '[]'  - Incontinence Assessment and Management 0 '[]'  - 0 Ostomy Care Assessment and Management (repouching, etc.) PROCESS - Coordination of Care '[]'  - Simple Patient / Family Education for ongoing care 0 '[]'  - 0 Complex (extensive) Patient / Family Education for ongoing care '[]'  - 0 Staff obtains Programmer, systems, Records, Test Results / Process Orders '[]'  - 0 Staff telephones HHA, Nursing Homes / Clarify orders / etc '[]'  - 0 Routine Transfer to another Facility (non-emergent condition) '[]'  - 0 Routine Hospital Admission (non-emergent condition) '[]'  - 0 New Admissions / Biomedical engineer / Ordering NPWT, Apligraf, etc. '[]'  - 0 Emergency Hospital Admission (emergent condition) PROCESS - Special Needs '[]'  - Pediatric / Minor Patient Management 0 '[]'  - 0 Isolation Patient Management '[]'  - 0 Hearing / Language / Visual special needs '[]'  - 0 Assessment of Community assistance (transportation, D/C planning, etc.) '[]'  - 0 Additional assistance / Altered mentation '[]'  - 0 Support Surface(s) Assessment (bed, cushion, seat, etc.) INTERVENTIONS - Miscellaneous '[]'  - External ear exam 0 '[]'  - 0 Patient Transfer (multiple staff / Civil Service fast streamer / Similar devices) '[]'  - 0 Simple Staple / Suture removal (25 or less) '[]'  - 0 Complex Staple / Suture removal (26 or more) '[]'  - 0 Hypo/Hyperglycemic Management (do not check if billed separately) '[]'  - 0 Ankle / Brachial Index (ABI) - do not check if billed separately Has the patient been seen at the hospital within the last three years: Yes Total Score: 0 Level Of Care: ____ Brittney [name] ([id number]) Electronic Signature(s) Signed: 07/31/2021 8:14:09 AM By: Dolan Amen RN Entered By: Dolan Amen on 07/30/2021 Brittney [name], Brittney [name] ([id number]) -------------------------------------------------------------------------------- Compression Therapy Details Patient Name: Brittney Gentles B. Date of Service: 07/30/2021 3:30 PM Medical  Record Number: [id number] Patient Account Number: [id number] Date of Birth/Sex: 04-11-1951 (70 y.o. F) Treating RN: Dolan Amen Primary Care Jerron Niblack: Emily Filbert Other Clinician: Referring Zayvien Canning: Emily Filbert Treating  Polly Barner/Extender: Jeri Cos Weeks in Treatment: 5 Compression Therapy Performed for Wound Assessment: Wound #1 Right Lower Leg Performed By: Clinician Dolan Amen, RN Compression Type: Rolena Infante Post Procedure Diagnosis Same as Pre-procedure Electronic Signature(s) Signed: 07/31/2021 8:14:09 AM By: Dolan Amen RN Entered By: Dolan Amen on 07/30/2021 16:42:28 Brittney [name] ([id number]) -------------------------------------------------------------------------------- Lower Extremity Assessment Details Patient Name: Brittney [name], [name] Date of Service: 07/30/2021 3:30 PM Medical Record Number: [id number] Patient Account Number: [id number] Date of Birth/Sex: [birth year] (70 y.o. F) Treating RN: Dolan Amen Primary Care Joevanni Roddey: Emily Filbert Other Clinician: Referring Case Vassell: Emily Filbert Treating Higinio Grow/Extender: Jeri Cos Weeks in Treatment: 5 Edema Assessment Assessed: [Left: Yes] [Right: Yes] Edema: [Left: Yes] [Right: Yes] Notes Left ankle fracture Electronic Signature(s) Signed: 07/31/2021 8:14:09 AM By: Dolan Amen RN Entered By: Dolan Amen on 07/30/2021 16:27:45 Brittney [name] ([id number]) -------------------------------------------------------------------------------- Multi Wound Chart Details Patient Name: Brittney Gentles B. Date of Service: 07/30/2021 3:30 PM Medical Record Number: [id number] Patient Account Number: [id number] Date of Birth/Sex: 09/09/1951 (70 y.o. F) Treating RN: Dolan Amen Primary Care Thurston Brendlinger: Emily Filbert Other Clinician: Referring Valli Randol: Emily Filbert Treating Izamar Linden/Extender: Skipper Cliche in Treatment: 5 Vital Signs Height(in): 28 Pulse(bpm): 29 Weight(lbs): 329 Blood  Pressure(mmHg): 151/76 Body Mass Index(BMI): 53 Temperature(F): 98.5 Respiratory Rate(breaths/min): 20 Photos: [N/A:N/A] Wound Location: Right Lower Leg Left Lower Leg N/A Wounding Event: Hematoma Hematoma N/A Primary Etiology: Trauma, Other Trauma, Other N/A Comorbid History: Anemia, Lymphedema, Chronic Anemia, Lymphedema, Chronic N/A Obstructive Pulmonary Disease Obstructive Pulmonary Disease (COPD), Sleep Apnea, (COPD), Sleep Apnea, Hypertension, Myocardial Infarction, Hypertension, Myocardial Infarction, Cirrhosis , Type II Diabetes, Cirrhosis , Type II Diabetes, Osteoarthritis, Neuropathy Osteoarthritis, Neuropathy Date Acquired: 04/26/2021 07/06/2021 N/A Weeks of Treatment: 5 0 N/A Wound Status: Open Open N/A Clustered Wound: Yes No N/A Clustered Quantity: 2 N/A N/A Measurements L x W x D (cm) 8.2x6x0.5 7.7x5x0.8 N/A Area (cm) : 38.642 30.238 N/A Volume (cm) : 19.321 24.19 N/A % Reduction in Area: -23.00% 0.00% N/A % Reduction in Volume: 12.10% 0.00% N/A Classification: Full Thickness Without Exposed Full Thickness Without Exposed N/A Support Structures Support Structures Exudate Amount: Large Large N/A Exudate Type: Sanguinous Sanguinous N/A Exudate Color: red red N/A Granulation Amount: Large (67-100%) None Present (0%) N/A Granulation Quality: Red N/A N/A Necrotic Amount: Small (1-33%) None Present (0%) N/A Exposed Structures: Fat Layer (Subcutaneous Tissue): Fat Layer (Subcutaneous Tissue): N/A Yes Yes Fascia: No Fascia: No Tendon: No Tendon: No Muscle: No Muscle: No Joint: No Joint: No Bone: No Bone: No Epithelialization: Medium (34-66%) None N/A Treatment Notes Electronic Signature(s) Signed: 07/31/2021 8:14:09 AM By: Dolan Amen RN Brittney [name], [name] ([id number]) Entered By: Dolan Amen on 07/30/2021 [place], [place] ([id number]) -------------------------------------------------------------------------------- Brittney [name] Details Patient Name: ANIA, LEVAY B. Date of Service: 07/30/2021 3:30 PM Medical Record Number: [id number] Patient Account Number: [id number] Date of Birth/Sex: 01/12/1951 (70 y.o. F) Treating RN: Dolan Amen Primary Care Eshaal Duby: Emily Filbert Other Clinician: Referring Enis Riecke: Emily Filbert Treating Collin Rengel/Extender: Skipper Cliche in Treatment: 5 Active Inactive Wound/Skin Impairment Nursing Diagnoses: Impaired tissue integrity Knowledge deficit related to smoking impact on wound healing Knowledge deficit related to ulceration/compromised skin integrity Goals: Ulcer/skin breakdown will have a volume reduction of 30% by week 4 Date Initiated: 06/22/2021 Target Resolution Date: 07/20/2021 Goal Status: Active Ulcer/skin breakdown will have a volume reduction of 50% by week 8 Date Initiated: 06/22/2021 Target Resolution Date: 08/17/2021 Goal Status: Active Ulcer/skin breakdown will have a volume reduction  of 80% by week 12 Date Initiated: 06/22/2021 Target Resolution Date: 09/14/2021 Goal Status: Active Interventions: Assess patient/caregiver ability to obtain necessary supplies Assess patient/caregiver ability to perform ulcer/skin care regimen upon admission and as needed Assess ulceration(s) every visit Notes: Electronic Signature(s) Signed: 07/31/2021 8:14:09 AM By: Dolan Amen RN Entered By: Dolan Amen on 07/30/2021 [place], [place]. ([id number]) -------------------------------------------------------------------------------- Pain Assessment Details Patient Name: Brittney Gentles B. Date of Service: 07/30/2021 3:30 PM Medical Record Number: [id number] Patient Account Number: [id number] Date of Birth/Sex: [birth year] (70 y.o. F) Treating RN: Dolan Amen Primary Care Jguadalupe Opiela: Emily Filbert Other Clinician: Referring Chanler Mendonca: Emily Filbert Treating Shanele Nissan/Extender: Skipper Cliche in Treatment: 5 Active Problems Location of Pain Severity and  Description of Pain Patient Has Paino Yes Site Locations Rate the pain. Current Pain Level: 7 Pain Management and Medication Current Pain Management: Electronic Signature(s) Signed: 07/31/2021 8:14:09 AM By: Dolan Amen RN Entered By: Dolan Amen on 07/30/2021 16:15:54 Brittney [name] ([id number]) -------------------------------------------------------------------------------- Wound Assessment Details Patient Name: Brittney [name], [name] Date of Service: 07/30/2021 3:30 PM Medical Record Number: [id number] Patient Account Number: [id number] Date of Birth/Sex: 03/19/1951 (70 y.o. F) Treating RN: Dolan Amen Primary Care Braedyn Riggle: Emily Filbert Other Clinician: Referring Jaquese Irving: Emily Filbert Treating Chevon Laufer/Extender: Skipper Cliche in Treatment: 5 Wound Status Wound Number: 1 Primary Trauma, Other Etiology: Wound Location: Right Lower Leg Wound Open Wounding Event: Hematoma Status: Date Acquired: 04/26/2021 Comorbid Anemia, Lymphedema, Chronic Obstructive Pulmonary Weeks Of Treatment: 5 History: Disease (COPD), Sleep Apnea, Hypertension, Myocardial Clustered Wound: Yes Infarction, Cirrhosis , Type II Diabetes, Osteoarthritis, Neuropathy Photos Wound Measurements Length: (cm) 8.2 Width: (cm) 6 Depth: (cm) 0.5 Clustered Quantity: 2 Area: (cm) 38.642 Volume: (cm) 19.321 % Reduction in Area: -23% % Reduction in Volume: 12.1% Epithelialization: Medium (34-66%) Tunneling: No Undermining: No Wound Description Classification: Full Thickness Without Exposed Support Structures Exudate Amount: Large Exudate Type: Sanguinous Exudate Color: red Foul Odor After Cleansing: No Slough/Fibrino No Wound Bed Granulation Amount: Large (67-100%) Exposed Structure Granulation Quality: Red Fascia Exposed: No Necrotic Amount: Small (1-33%) Fat Layer (Subcutaneous Tissue) Exposed: Yes Necrotic Quality: Adherent Slough Tendon Exposed: No Muscle Exposed: No Joint Exposed:  No Bone Exposed: No Treatment Notes Wound #1 (Lower Leg) Wound Laterality: Right Cleanser Soap and Water Discharge Instruction: Gently cleanse wound with antibacterial soap, rinse and pat dry prior to dressing wounds Brittney [name], [name] ([id number]) Peri-Wound Care Topical Primary Dressing Hydrofera Blue Ready Transfer Foam, 2.5x2.5 (in/in) Discharge Instruction: Apply Hydrofera Blue Ready to wound bed as directed Secondary Dressing Xtrasorb Medium 4x5 (in/in) Discharge Instruction: Apply to wound as directed. Do not cut. Secured With Compression Wrap Unna Boot 4x10 (in/yd) Discharge Instruction: Unna Paste, Kerlix and Coban from base of toes to three finger widths below bend in knee. Compression Stockings Add-Ons Electronic Signature(s) Signed: 07/31/2021 8:14:09 AM By: Dolan Amen RN Entered By: Dolan Amen on 07/30/2021 16:26:16 Brittney [name] ([id number]) -------------------------------------------------------------------------------- Wound Assessment Details Patient Name: Brittney [name], [name] Date of Service: 07/30/2021 3:30 PM Medical Record Number: [id number] Patient Account Number: [id number] Date of Birth/Sex: 1951/10/31 (70 y.o. F) Treating RN: Dolan Amen Primary Care Akaisha Truman: Emily Filbert Other Clinician: Referring Jordain Radin: Emily Filbert Treating Lalisa Kiehn/Extender: Skipper Cliche in Treatment: 5 Wound Status Wound Number: 2 Primary Trauma, Other Etiology: Wound Location: Left Lower Leg Wound Open Wounding Event: Hematoma Status: Date Acquired: 07/06/2021 Comorbid Anemia, Lymphedema, Chronic Obstructive Pulmonary Weeks Of Treatment: 0 History: Disease (COPD), Sleep Apnea, Hypertension, Myocardial Clustered Wound: No  Infarction, Cirrhosis , Type II Diabetes, Osteoarthritis, Neuropathy Photos Wound Measurements Length: (cm) 7.7 Width: (cm) 5 Depth: (cm) 0.8 Area: (cm) 30.238 Volume: (cm) 24.19 % Reduction in Area: 0% % Reduction in Volume:  0% Epithelialization: None Tunneling: No Undermining: No Wound Description Classification: Full Thickness Without Exposed Support Structures Exudate Amount: Large Exudate Type: Sanguinous Exudate Color: red Foul Odor After Cleansing: No Slough/Fibrino No Wound Bed Granulation Amount: None Present (0%) Exposed Structure Necrotic Amount: None Present (0%) Fascia Exposed: No Fat Layer (Subcutaneous Tissue) Exposed: Yes Tendon Exposed: No Muscle Exposed: No Joint Exposed: No Bone Exposed: No Treatment Notes Wound #2 (Lower Leg) Wound Laterality: Left Cleanser [place] Ancillary Kit - 15 Day Supply Discharge Instruction: Use supplies as instructed; Kit contains: (15) Saline Bullets; (15) 3x3 Gauze; 15 pr Gloves Brittney [name], [name] ([id number]) Topical Primary Dressing Secondary Dressing ABD Pad 5x9 (in/in) Discharge Instruction: Cover with ABD pad [place] Discharge Instruction: Moisten with Dakins and pack wound Kerlix 4.5 x 4.1 (in/yd) Discharge Instruction: Apply Kerlix 4.5 x 4.1 (in/yd) as instructed Secured With 779M ACE Elastic Bandage With VELCRO Brand Closure, 4 (in) Discharge Instruction: Secure dressing 779M Medipore H Soft Cloth Surgical Tape, 2x2 (in/yd) Discharge Instruction: Secure dressing Compression Wrap Compression Stockings Add-Ons Electronic Signature(s) Signed: 07/31/2021 8:14:09 AM By: Dolan Amen RN Entered By: Dolan Amen on 07/30/2021 16:26:56 Brittney [name] ([id number]) -------------------------------------------------------------------------------- Brittney [name] Details Patient Name: Brittney Gentles B. Date of Service: 07/30/2021 3:30 PM Medical Record Number: [id number] Patient Account Number: [id number] Date of Birth/Sex: [birth year] (70 y.o. F) Treating RN: Dolan Amen Primary Care Aikeem Lilley: Emily Filbert Other Clinician: Referring Blanchard Willhite: Emily Filbert Treating Caulin Begley/Extender: Skipper Cliche in  Treatment: 5 Vital Signs Time Taken: 16:15 Temperature (F): 98.5 Height (in): 66 Pulse (bpm): 74 Weight (lbs): 329 Respiratory Rate (breaths/min): 20 Body Mass Index (BMI): 53.1 Blood Pressure (mmHg): 151/76 Reference Range: 80 - 120 mg / dl Electronic Signature(s) Signed: 07/31/2021 8:14:09 AM By: Dolan Amen RN Entered By: Dolan Amen on 07/30/2021 16:15:38

## 2021-07-30 NOTE — Progress Notes (Addendum)
ROSENA, [name] ([id number]) Visit Report for 07/30/2021 Chief Complaint Document Details Patient Name: Brittney [name], Brittney [name]. Date of Service: 07/30/2021 3:30 PM Medical Record Number: [id number] Patient Account Number: [id number] Date of Birth/Sex: 1951-02-08 (70 y.o. F) Treating RN: Dolan Amen Primary Care Provider: Emily Filbert Other Clinician: Referring Provider: Emily Filbert Treating Provider/Extender: Skipper Cliche in Treatment: 5 Information Obtained from: Patient Chief Complaint Right LE Hematoma following contusion with new left LE hematoma first noted on 07/30/21 Electronic Signature(s) Signed: 07/30/2021 9:44:25 PM By: Worthy Keeler PA-C Previous Signature: 07/30/2021 3:45:14 PM Version By: Worthy Keeler PA-C Entered By: Worthy Keeler on 07/30/2021 21:37:10 [name] ([id number]) -------------------------------------------------------------------------------- Debridement Details Patient Name: Brittney Gentles B. Date of Service: 07/30/2021 3:30 PM Medical Record Number: [id number] Patient Account Number: [id number] Date of Birth/Sex: 1951/09/17 (70 y.o. F) Treating RN: Dolan Amen Primary Care Provider: Emily Filbert Other Clinician: Referring Provider: Emily Filbert Treating Provider/Extender: Skipper Cliche in Treatment: 5 Debridement Performed for Wound #2 Left Lower Leg Assessment: Performed By: Physician Tommie Sams., PA-C Debridement Type: Debridement Level of Consciousness (Pre- Awake and Alert procedure): Pre-procedure Verification/Time Out Yes - 16:45 Taken: Start Time: 16:45 Total Area Debrided (L x W): 7.7 (cm) x 5 (cm) = 38.5 (cm) Tissue and other material Viable, Non-Viable, Blood Clots, Muscle, Subcutaneous, Skin: Epidermis debrided: Level: Skin/Subcutaneous Tissue/Muscle Debridement Description: Excisional Instrument: Forceps, Scissors Bleeding: Moderate Hemostasis Achieved: Pressure Response to Treatment: Procedure was tolerated  well Level of Consciousness (Post- Awake and Alert procedure): Post Debridement Measurements of Total Wound Length: (cm) 7.7 Width: (cm) 5 Depth: (cm) 2.7 Volume: (cm) 81.642 Character of Wound/Ulcer Post Debridement: Stable Post Procedure Diagnosis Same as Pre-procedure Electronic Signature(s) Signed: 07/30/2021 9:44:25 PM By: Worthy Keeler PA-C Signed: 07/31/2021 8:14:09 AM By: Dolan Amen RN Entered By: Worthy Keeler on 07/30/2021 21:42:10 [name] ([id number]) -------------------------------------------------------------------------------- HPI Details Patient Name: Brittney Gentles B. Date of Service: 07/30/2021 3:30 PM Medical Record Number: [id number] Patient Account Number: [id number] Date of Birth/Sex: [birth year] (70 y.o. F) Treating RN: Dolan Amen Primary Care Provider: Emily Filbert Other Clinician: Referring Provider: Emily Filbert Treating Provider/Extender: Skipper Cliche in Treatment: 5 History of Present Illness HPI Description: 06/22/2021 upon evaluation today patient appears to be doing somewhat poorly in regard to the wound that actually started around the beginning of May. She got a an IT trainer wheelchair which she tells me was for the most part a blessing. With that being said she unfortunately had an issue where she was in the bathroom and subsequently had left it on when she stopped to do something at the counter. In the end she actually had some issues here with running into the cabinet. She tells me that she actually hit the cabinet a couple times and could not get the chair to back up and to stop therefore she had quite a bit of damage to her leg. Her primary care provider put her in a boot on this trying to get it to reabsorb but unfortunately that is just not the case and not what we see happen. Fortunately there does not appear to be any signs of active infection at this time she is on doxycycline for a sinus infection completely separate  from this issue but nonetheless that does seem to be a good antibiotic generally for skin infections and I think is at least something good for her currently. She does have some discomfort. Her daughter is present during the evaluation today. I  did explain to the patient and her daughter that I do believe her get have to evacuate the hematoma this is already open and to be honest its not getting healed very well until we clear this out. 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 With that being said her hemoglobin A1c is stated to be around 7.5 which is actually good and should not impede her healing this is great news. Overall I am pleased with what I am hearing in that regard. With that being said I still think this is going to take quite a while for this patient to heal we do need however to clear away the necrotic and nonviable/healthy tissue. 06/30/2021 upon evaluation today patient appears to be doing well with regard to her wound although there is some signs of infection. We did have to perform a fairly significant debridement last week. Obviously this has been somewhat painful for her which I think is part of the issue but the other part I think is simply that she may have an infection here which is problematic as well. We definitely need to try to do something to improve this. I did obtain a wound culture today we will see what that shows. 07/30/21 upon evaluation today patient presents for reevaluation here in the clinic since I last saw her she's actually been in the hospital due to having fractured her left ankle region. Unfortunately she also has a significant hematoma of the left leg which has not been addressed either as far as evacuating hematoma currently. She does have a fairly tight ace wrap on the left foot and ankle region this does not go any  higher however. With that being said I did discuss with the patient currently today as well as her daughter who was present that we do need to tried to clear away the necrotic tissue on the surface where the hematoma is and then subsequently evacuate as much of the hematoma as possible. With that being said there does not appear to be any evidence of infection she is on antibiotic currently. That is Bactrim DS. That's actually a good one for the time being. She's been using Dakin solution on the right anterior shin where she had a previous hematoma. Now it looks like that's probably to be the best option for the left as well. I have not actually seen her since July 5 and the reason is because she did have the fault, fractured ankle, and was in the hospital until just recently. Electronic Signature(s) Signed: 07/30/2021 9:44:25 PM By: Worthy Keeler PA-C Entered By: Worthy Keeler on 07/30/2021 21:38:30 [name] ([id number]) -------------------------------------------------------------------------------- Physical Exam Details Patient Name: Brittney [name], [name] Date of Service: 07/30/2021 3:30 PM Medical Record Number: [id number] Patient Account Number: [id number] Date of Birth/Sex: [birth year] (70 y.o. F) Treating RN: Dolan Amen Primary Care Provider: Emily Filbert Other Clinician: Referring Provider: Emily Filbert Treating Provider/Extender: Skipper Cliche in Treatment: 5 Constitutional patient is hypertensive.. pulse regular and within target range for patient.Marland Kitchen respirations regular, non-labored and within target range for patient.Marland Kitchen temperature within target range for patient.. Obese and well-hydrated in no acute distress. Respiratory normal breathing without difficulty. Psychiatric this patient is able to make decisions and demonstrates good insight into disease process. Alert and Oriented x 3. pleasant and cooperative. Notes Upon inspection patients wound actually showed signs  of good regulation next though session  at this point. There does not appear to be any evidence of active infection which is great news I'm glad that she's on the Bactrim at least that has helped her quite a bit as far as preventing any issues with infection spreading which is great news considering the significant overall appearance of this hematoma. With regard to swelling unfortunately her left leg is quite sold swollen due to the fact that she has an ace wrap fairly tight around the foot and ankle region due to the fracture. Again this is not help anything with regard to the hematoma and swelling around that area. Electronic Signature(s) Signed: 07/30/2021 9:44:25 PM By: Worthy Keeler PA-C Entered By: Worthy Keeler on 07/30/2021 21:39:22 [name] ([id number]) -------------------------------------------------------------------------------- Physician Orders Details Patient Name: Brittney [name], [name] Date of Service: 07/30/2021 3:30 PM Medical Record Number: [id number] Patient Account Number: [id number] Date of Birth/Sex: 1951-07-10 (70 y.o. F) Treating RN: Dolan Amen Primary Care Provider: Emily Filbert Other Clinician: Referring Provider: Emily Filbert Treating Provider/Extender: Skipper Cliche in Treatment: 5 Verbal / Phone Orders: No Diagnosis Coding ICD-10 Coding Code Description S80.11XA Contusion of right lower leg, initial encounter 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 with unspecified severity I10 Essential (primary) hypertension N18.2 Chronic kidney disease, stage 2 (mild) J44.9 Chronic obstructive pulmonary disease, unspecified E11.622 Type 2 diabetes mellitus with other skin ulcer Follow-up Appointments o Return Appointment in 1 week. o Nurse Visit as needed [place] for wound care. May utilize formulary equivalent dressing for wound treatment orders  unless otherwise specified. Home Health Nurse may visit PRN to address patientos wound care needs. o Scheduled days for dressing changes to be completed; exception, patient has scheduled wound care visit that day. - ****Unna boot applied in office today, please schedule patient home visit for Friday***** Bathing/ Shower/ Hygiene o May shower with wound dressing protected with water repellent cover or cast protector. o No tub bath. Additional Orders / Instructions o Follow Nutritious Diet and Increase Protein Intake Medications-Please add to medication list. o P.O. Antibiotics Wound Treatment Wound #1 - Lower Leg Wound Laterality: Right Cleanser: Soap and Water 2 x Per Week/30 Days Discharge Instructions: Gently cleanse wound with antibacterial soap, rinse and pat dry prior to dressing wounds Primary Dressing: Hydrofera Blue Ready Transfer Foam, 2.5x2.5 (in/in) 2 x Per Week/30 Days Discharge Instructions: Apply Hydrofera Blue Ready to wound bed as directed Secondary Dressing: Xtrasorb Medium 4x5 (in/in) 2 x Per Week/30 Days Discharge Instructions: Apply to wound as directed. Do not cut. Compression Wrap: Unna Boot 4x10 (in/yd) 2 x Per Week/30 Days Discharge Instructions: Unna Paste, Kerlix and Coban from base of toes to three finger widths below bend in knee. Wound #2 - Lower Leg Wound Laterality: Left Cleanser: Byram Ancillary Kit - 15 Day Supply (DME) (Generic) 1 x Per Day/30 Days Discharge Instructions: Use supplies as instructed; Kit contains: (15) Saline Bullets; (15) 3x3 Gauze; 15 pr Gloves Secondary Dressing: ABD Pad 5x9 (in/in) (DME) (Generic) 1 x Per Day/30 Days Discharge Instructions: Cover with ABD pad Secondary Dressing: Conforming Guaze Roll-Large (DME) (Generic) 1 x Per Day/30 Days Discharge Instructions: Moisten with Dakins and pack wound KIMARI, LIENHARD B. ([id number]) Secondary Dressing: Kerlix 4.5 x 4.1 (in/yd) (DME) (Generic) 1 x Per Day/30 Days Discharge  Instructions: Apply Kerlix 4.5 x 4.1 (in/yd) as instructed Secured With: 18M Medipore H Soft Cloth Surgical Tape, 2x2 (in/yd) 1 x Per  Day/30 Days Discharge Instructions: Secure dressing Secured With: Tubigrip Size F, 4x10 (in/yd) 1 x Per Day/30 Days Discharge Instructions: Apply Tubigrip F 3 finger-widths below knee to base of toes to secure dressing and/or for swelling. Electronic Signature(s) Signed: 07/31/2021 8:14:09 AM By: Dolan Amen RN Signed: 07/31/2021 6:21:30 PM By: Worthy Keeler PA-C Previous Signature: 07/30/2021 9:44:25 PM Version By: Worthy Keeler PA-C Entered By: Dolan Amen on 07/31/2021 08:09:00 [name] ([id number]) -------------------------------------------------------------------------------- Problem List Details Patient Name: Brittney [name], [name] Date of Service: 07/30/2021 3:30 PM Medical Record Number: [id number] Patient Account Number: [id number] Date of Birth/Sex: 06-Oct-1951 (70 y.o. 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 07/30/2021 No Yes muscle I10 Essential (primary) hypertension 06/22/2021 No Yes N18.2 Chronic kidney disease, stage 2 (mild) 06/22/2021 No Yes J44.9 Chronic obstructive pulmonary disease, unspecified 06/22/2021 No Yes E11.622 Type 2 diabetes mellitus with other skin ulcer 06/22/2021 No Yes Inactive Problems Resolved Problems Electronic Signature(s) Signed: 07/30/2021 9:44:25 PM By: Worthy Keeler PA-C Previous Signature: 07/30/2021 3:45:03 PM Version By: Worthy Keeler PA-C Entered By: Worthy Keeler on 07/30/2021 21:36:22 [name] ([id number]) -------------------------------------------------------------------------------- Progress Note Details Patient Name: Brittney Gentles B. Date of Service: 07/30/2021 3:30 PM Medical Record Number: [id number] Patient Account Number: [id number] Date of Birth/Sex: 1951/02/11 (70 y.o. F) Treating RN: Dolan Amen Primary Care Provider: Emily Filbert Other Clinician: Referring Provider: Emily Filbert Treating Provider/Extender: Skipper Cliche in Treatment: 5 Subjective Chief Complaint Information obtained from Patient Right LE Hematoma following contusion with new left LE hematoma first noted on 07/30/21 History of Present Illness (HPI) 06/22/2021 upon evaluation today patient appears to be doing somewhat poorly in regard to the wound that actually started around the beginning of May. She got a an IT trainer wheelchair which she tells me was for the most part a blessing. With that being said she unfortunately had an issue where she was in the bathroom and subsequently had left it on when she stopped to do something at the counter. In the end she actually had some issues here with running into the cabinet. She tells me that she actually hit the cabinet a couple times and could not get the chair to back up and to stop therefore she had quite a bit of damage to her leg. Her primary care provider put her in a boot on this trying to get it to reabsorb but unfortunately that is just not the case and not what we see happen. Fortunately there does not appear to be any signs of active infection at this time she is on doxycycline for a sinus infection completely separate from this issue but nonetheless that does seem to be a good antibiotic generally for skin infections and I think is at least something good for her currently. She does have some  discomfort. Her daughter is present during the evaluation today. I did explain to the patient and her daughter that I do believe her get have to evacuate the hematoma this is already open and to  be honest its not getting healed very well until we clear this out. 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 With that being said her hemoglobin A1c is stated to be around 7.5 which is actually good and should not impede her healing this is great news. Overall I am pleased with what I am hearing in that regard. With that being said I still think this is going to take quite a while for this patient to heal we do need however to clear away the necrotic and nonviable/healthy tissue. 06/30/2021 upon evaluation today patient appears to be doing well with regard to her wound although there is some signs of infection. We did have to perform a fairly significant debridement last week. Obviously this has been somewhat painful for her which I think is part of the issue but the other part I think is simply that she may have an infection here which is problematic as well. We definitely need to try to do something to improve this. I did obtain a wound culture today we will see what that shows. 07/30/21 upon evaluation today patient presents for reevaluation here in the clinic since I last saw her she's actually been in the hospital due to having fractured her left ankle region. Unfortunately she also has a significant hematoma of the left leg which has not been addressed either as far as evacuating hematoma currently. She does have a fairly tight ace wrap on the left foot and ankle region this does not go any higher however. With that being said I did discuss with the patient currently today as well as her daughter who was present that we do need to tried to clear away the necrotic tissue on the  surface where the hematoma is and then subsequently evacuate as much of the hematoma as possible. With that being said there does not appear to be any evidence of infection she is on antibiotic currently. That is Bactrim DS. That's actually a good one for the time being. She's been using Dakin solution on the right anterior shin where she had a previous hematoma. Now it looks like that's probably to be the best option for the left as well. I have not actually seen her since July 5 and the reason is because she did have the fault, fractured ankle, and was in the hospital until just recently. Objective Constitutional patient is hypertensive.. pulse regular and within target range for patient.Marland Kitchen respirations regular, non-labored and within target range for patient.Marland Kitchen temperature within target range for patient.. Obese and well-hydrated in no acute distress. Vitals Time Taken: 4:15 PM, Height: 66 in, Weight: 329 lbs, BMI: 53.1, Temperature: 98.5 F, Pulse: 74 bpm, Respiratory Rate: 20 breaths/min, Blood Pressure: 151/76 mmHg. Respiratory normal breathing without difficulty. Psychiatric this patient is able to make decisions and demonstrates good insight into disease process. Alert and Oriented x 3. pleasant and cooperative. General Notes: Upon inspection patients wound actually showed signs of good regulation next though session at this point. There does not appear to be any evidence of active infection which is great news I'm glad that she's on the Bactrim at least that has helped her quite a bit as far as preventing any issues with infection spreading which is great news considering the significant overall appearance of this hematoma. With regard TEPHANIE, ESCORCIA ([id number]) to swelling unfortunately her left leg is quite sold swollen due to  the fact that she has an ace wrap fairly tight around the foot and ankle region due to the fracture. Again this is not help anything with regard to the  hematoma and swelling around that area. Integumentary (Hair, Skin) Wound #1 status is Open. Original cause of wound was Hematoma. The date acquired was: 04/26/2021. The wound has been in treatment 5 weeks. The wound is located on the Right Lower Leg. The wound measures 8.2cm length x 6cm width x 0.5cm depth; 38.642cm^2 area and 19.321cm^3 volume. There is Fat Layer (Subcutaneous Tissue) exposed. There is no tunneling or undermining noted. There is a large amount of sanguinous drainage noted. There is large (67-100%) red granulation within the wound bed. There is a small (1-33%) amount of necrotic tissue within the wound bed including Adherent Slough. Wound #2 status is Open. Original cause of wound was Hematoma. The date acquired was: 07/06/2021. The wound is located on the Left Lower Leg. The wound measures 7.7cm length x 5cm width x 0.8cm depth; 30.238cm^2 area and 24.19cm^3 volume. There is Fat Layer (Subcutaneous Tissue) exposed. There is no tunneling or undermining noted. There is a large amount of sanguinous drainage noted. There is no granulation within the wound bed. There is no necrotic tissue within the wound bed. 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 There was a Excisional Skin/Subcutaneous Tissue/Muscle Debridement with a total area of 38.5 sq cm performed by Tommie Sams., PA-C. With the following instrument(s): Forceps, and Scissors to remove Viable  and Non-Viable tissue/material. Material removed includes Blood Clots, Muscle, Subcutaneous Tissue, and Skin: Epidermis. A time out was conducted at 16:45, prior to the start of the procedure. A Moderate amount of bleeding was controlled with Pressure. The procedure was tolerated well. Post Debridement Measurements: 7.7cm length x 5cm width x 2.7cm depth; 81.642cm^3 volume. Character of Wound/Ulcer Post Debridement is stable. Post procedure Diagnosis Wound #2: Same as Pre-Procedure Wound #1 Pre-procedure diagnosis of Wound #1 is a Trauma, Other located on the Right Lower Leg . There was a Haematologist Compression Therapy Procedure by Dolan Amen, RN. Post procedure Diagnosis Wound #1: Same as Pre-Procedure Plan Follow-up Appointments: Return Appointment in 1 week. Nurse Visit as needed Home Health: [facility] [facility]: - [facility] for wound care. May utilize formulary equivalent dressing for wound treatment orders unless otherwise specified. Home Health Nurse may visit [facility] to address patient s wound care needs. Scheduled days for dressing changes to be completed; exception, patient has scheduled wound care visit that day. - ****Unna boot applied in office today, please schedule patient home visit for Friday***** Bathing/ Shower/ Hygiene: May shower with wound dressing protected with water repellent cover or cast protector. No tub bath. Additional Orders / Instructions: Follow Nutritious Diet and Increase Protein Intake MOLLY, SAVARINO B. ([id number]) Medications-Please add to medication list.: P.O. Antibiotics WOUND #1: - Lower Leg Wound Laterality: Right Cleanser: Soap and Water 2 x Per Week/30 Days Discharge Instructions: Gently cleanse wound with antibacterial soap, rinse and pat dry prior to dressing  wounds Primary Dressing: Hydrofera Blue Ready Transfer Foam, 2.5x2.5 (in/in) 2 x Per Week/30 Days Discharge Instructions: Apply Hydrofera Blue Ready to wound bed  as directed Secondary Dressing: Xtrasorb Medium 4x5 (in/in) 2 x Per Week/30 Days Discharge Instructions: Apply to wound as directed. Do not cut. Compression Wrap: Unna Boot 4x10 (in/yd) 2 x Per Week/30 Days Discharge Instructions: Unna Paste, Kerlix and Coban from base of toes to three finger widths below bend in knee. WOUND #2: - Lower Leg Wound Laterality: Left Cleanser: Byram Ancillary Kit - 15 Day Supply (DME) (Generic) 1 x Per Day/30 Days Discharge Instructions: Use supplies as instructed; Kit contains: (15) Saline Bullets; (15) 3x3 Gauze; 15 pr Gloves Secondary Dressing: ABD Pad 5x9 (in/in) (DME) (Generic) 1 x Per Day/30 Days Discharge Instructions: Cover with ABD pad Secondary Dressing: Conforming Guaze Roll-Large (DME) (Generic) 1 x Per Day/30 Days Discharge Instructions: Moisten with Dakins and pack wound Secondary Dressing: Kerlix 4.5 x 4.1 (in/yd) (DME) (Generic) 1 x Per Day/30 Days Discharge Instructions: Apply Kerlix 4.5 x 4.1 (in/yd) as instructed Secured With: 17M ACE Elastic Bandage With VELCRO Brand Closure, 4 (in) (DME) (Generic) 1 x Per Day/30 Days Discharge Instructions: Secure dressing Secured With: 17M Medipore H Soft Cloth Surgical Tape, 2x2 (in/yd) (DME) (Generic) 1 x Per Day/30 Days Discharge Instructions: Secure dressing 1. With regard to the right leg I'm actually going to switch over to [facility] Dressing think is a good option for the patient currently she is in agreement with that plan. 2. With regard to the left leg the patient does look as if she would benefit from continuation of the Dakin s moistened gauze dressing here. I think that's probably going to be the best initial option especially after debridement there's quite a bit of depth here and again we need to try to get this cleaned out so can start to heal. I think a Wound VAC is also something for consideration here. 3. With regard to infection I do believe the Bactrim has things under control at sea  no signs of active infection at this point. With that being said I do believe that the patient may benefit from extension of the antibiotics next week depending on how things are doing. 4. I'm going to suggest as well the patient should try to elevate her legs as much as possible especially considering the extremely tight ace wrap that they have on her due to the fracture of her ankle region that she's not even able to have cast on at this point. Please see above for specific wound care orders. We will see patient for re-evaluation in 1 week(s) here in the clinic. If anything worsens or changes patient will contact our office for additional recommendations. Electronic Signature(s) Signed: 07/30/2021 9:44:25 PM By: Worthy Keeler PA-C Entered By: Worthy Keeler on 07/30/2021 21:42:28 [name] ([id number]) -------------------------------------------------------------------------------- SuperBill Details Patient Name: Brittney [name], [name] Date of Service: 07/30/2021 Medical Record Number: [id number] Patient Account Number: [id number] Date of Birth/Sex: 05-04-1951 (70 y.o. 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,  stage 2 (mild) J44.9 Chronic obstructive pulmonary disease, unspecified E11.622 Type 2 diabetes mellitus with other skin ulcer Facility Procedures CPT4 Code: 97282060 Description: 15615 - DEB MUSC/FASCIA 20 SQ CM/< Modifier: Quantity:  1 CPT4 Code: Description: ICD-10 Diagnosis Description 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 Modifier: of muscle Quantity: CPT4 Code: 37943276 Description: 11046 - DEB MUSC/FASCIA EA ADDL 20 CM Modifier: Quantity: 1 CPT4 Code: Description: ICD-10 Diagnosis Description 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 Modifier: of muscle Quantity: Physician Procedures CPT4 Code: 1470929 Description: 57473 - WC PHYS LEVEL 4 - EST PT Modifier: 25 Quantity: 1 CPT4 Code: Description: ICD-10 Diagnosis Description S80.11XA Contusion of right lower leg, initial encounter 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 with unspecifie S80.12XA Contusion of left lower leg, initial encounter L97.823  Non-pressure chronic ulcer of other part of left lower leg with necrosis of Modifier: d severity muscle Quantity: CPT4 Code: [id number] Description: 43838 - WC PHYS DEBR MUSCLE/FASCIA 20 SQ CM Modifier: Quantity: 1 CPT4 Code: Description: ICD-10 Diagnosis Description 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

## 2021-08-05 ENCOUNTER — Encounter: Payer: Self-pay | Admitting: Student in an Organized Health Care Education/Training Program

## 2021-08-06 ENCOUNTER — Encounter: Payer: Medicare Other | Admitting: Physician Assistant

## 2021-08-06 ENCOUNTER — Ambulatory Visit
Payer: Medicare Other | Attending: Student in an Organized Health Care Education/Training Program | Admitting: Student in an Organized Health Care Education/Training Program

## 2021-08-06 ENCOUNTER — Other Ambulatory Visit: Payer: Self-pay

## 2021-08-06 ENCOUNTER — Encounter: Payer: Self-pay | Admitting: Student in an Organized Health Care Education/Training Program

## 2021-08-06 DIAGNOSIS — M797 Fibromyalgia: Secondary | ICD-10-CM

## 2021-08-06 DIAGNOSIS — M25572 Pain in left ankle and joints of left foot: Secondary | ICD-10-CM | POA: Diagnosis not present

## 2021-08-06 DIAGNOSIS — G894 Chronic pain syndrome: Secondary | ICD-10-CM

## 2021-08-06 DIAGNOSIS — M5416 Radiculopathy, lumbar region: Secondary | ICD-10-CM

## 2021-08-06 DIAGNOSIS — S8255XS Nondisplaced fracture of medial malleolus of left tibia, sequela: Secondary | ICD-10-CM

## 2021-08-06 DIAGNOSIS — G8929 Other chronic pain: Secondary | ICD-10-CM

## 2021-08-06 DIAGNOSIS — Z9889 Other specified postprocedural states: Secondary | ICD-10-CM

## 2021-08-06 DIAGNOSIS — M961 Postlaminectomy syndrome, not elsewhere classified: Secondary | ICD-10-CM

## 2021-08-06 DIAGNOSIS — M4804 Spinal stenosis, thoracic region: Secondary | ICD-10-CM

## 2021-08-06 DIAGNOSIS — M48061 Spinal stenosis, lumbar region without neurogenic claudication: Secondary | ICD-10-CM

## 2021-08-06 DIAGNOSIS — M47816 Spondylosis without myelopathy or radiculopathy, lumbar region: Secondary | ICD-10-CM

## 2021-08-06 DIAGNOSIS — Z9689 Presence of other specified functional implants: Secondary | ICD-10-CM

## 2021-08-06 DIAGNOSIS — S8011XA Contusion of right lower leg, initial encounter: Secondary | ICD-10-CM | POA: Diagnosis not present

## 2021-08-06 MED ORDER — OXYCODONE-ACETAMINOPHEN 10-325 MG PO TABS: 1.0000 | ORAL_TABLET | Freq: Two times a day (BID) | ORAL | 0 refills | Status: DC | PRN

## 2021-08-06 NOTE — Progress Notes (Addendum)
Brittney [name], Brittney [name] ([id number]) Visit Report for 08/06/2021 Chief Complaint Document Details Patient Name: Brittney [name], Brittney [name]. Date of Service: 08/06/2021 3:45 PM Medical Record Number: [id number] Patient Account Number: [id number] Date of Birth/Sex: 1951/04/15 (71 y.o. Female) Treating RN: Carlene Coria Primary Care Provider: Emily Filbert Other Clinician: Referring Provider: Emily Filbert Treating Provider/Extender: Skipper Cliche in Treatment: 6 Information Obtained from: Patient Chief Complaint Right LE Hematoma following contusion with new left LE hematoma first noted on 07/30/21 Electronic Signature(s) Signed: 08/06/2021 3:59:06 PM By: Worthy Keeler PA-C Entered By: Worthy Keeler on 08/06/2021 15:59:06 Brittney [name] ([id number]) -------------------------------------------------------------------------------- HPI Details Patient Name: Brittney Gentles B. Date of Service: 08/06/2021 3:45 PM Medical Record Number: [id number] Patient Account Number: [id number] Date of Birth/Sex: March 14, 1951 (70 y.o. Female) Treating RN: Carlene Coria Primary Care Provider: Emily Filbert Other Clinician: Referring Provider: Emily Filbert Treating Provider/Extender: Skipper Cliche in Treatment: 6 History of Present Illness HPI Description: 06/22/2021 upon evaluation today patient appears to be doing somewhat poorly in regard to the wound that actually started around the beginning of May. She got a an IT trainer wheelchair which she tells me was for the most part a blessing. With that being said she unfortunately had an issue where she was in the bathroom and subsequently had left it on when she stopped to do something at the counter. In the end she actually had some issues here with running into the cabinet. She tells me that she actually hit the cabinet a couple times and could not get the chair to back up and to stop therefore she had quite a bit of damage to her leg. Her primary care provider put her in a  boot on this trying to get it to reabsorb but unfortunately that is just not the case and not what we see happen. Fortunately there does not appear to be any signs of active infection at this time she is on doxycycline for a sinus infection completely separate from this issue but nonetheless that does seem to be a good antibiotic generally for skin infections and I think is at least something good for her currently. She does have some discomfort. Her daughter is present during the evaluation today. I did explain to the patient and her daughter that I do believe her get have to evacuate the hematoma this is already open and to be honest its not getting healed very well until we clear this out. 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 With that being said her hemoglobin A1c is stated to be around 7.5 which is actually good and should not impede her healing this is great news. Overall I am pleased with what I am hearing in that regard. With that being said I still think this is going to take quite a while for this patient to heal we do need however to clear away the necrotic and nonviable/healthy tissue. 06/30/2021 upon evaluation today patient appears to be doing well with regard to her wound although there is some signs of infection. We did have to perform a fairly significant debridement last week. Obviously this has been somewhat painful for her which I think is part of the issue but the other part I think is simply that she may have an infection here which is problematic as well. We definitely need to try to do something to improve this. I did obtain a wound  culture today we will see what that shows. 07/30/21 upon evaluation today patient presents for reevaluation here in the clinic since I last saw her she's actually been in the hospital due to having fractured her left  ankle region. Unfortunately she also has a significant hematoma of the left leg which has not been addressed either as far as evacuating hematoma currently. She does have a fairly tight ace wrap on the left foot and ankle region this does not go any higher however. With that being said I did discuss with the patient currently today as well as her daughter who was present that we do need to tried to clear away the necrotic tissue on the surface where the hematoma is and then subsequently evacuate as much of the hematoma as possible. With that being said there does not appear to be any evidence of infection she is on antibiotic currently. That is Bactrim DS. That's actually a good one for the time being. She's been using Dakin solution on the right anterior shin where she had a previous hematoma. Now it looks like that's probably to be the best option for the left as well. I have not actually seen her since July 5 and the reason is because she did have the fault, fractured ankle, and was in the hospital until just recently. 08/06/2021 upon evaluation today patient appears to be doing actually decently well in regard to the wound on her right leg there is a lot of healing happening and I am very pleased in that regard. In regard to her left leg this actually is also dramatically improved compared to last week. The biggest issue she is having with her ankle at this point is that she did have a popping sensation she is not exactly sure what happened here but to be honest its been hurting her quite severely since yesterday she was not even able to get into the office at the orthopedic clinic to get this checked out to try to work on getting a mobile x-ray out. With that being said she is still having a tremendous amount of pain in the wound also on the left leg hurts as well. They have been doing the Dakin's moistened gauze dressing in an effective way and I do feel like that it is doing great at this point.  There is some slough and biofilm that could require sharp debridement but to be honest she is having so much pain and so much going on that I would get and defer that at this point. Electronic Signature(s) Signed: 08/06/2021 5:03:43 PM By: Worthy Keeler PA-C Entered By: Worthy Keeler on 08/06/2021 17:03:43 Brittney [name] ([id number]) -------------------------------------------------------------------------------- Physical Exam Details Patient Name: Brittney [name], [name] Date of Service: 08/06/2021 3:45 PM Medical Record Number: [id number] Patient Account Number: [id number] Date of Birth/Sex: 01-02-1951 (70 y.o. Female) Treating RN: Carlene Coria Primary Care Provider: Emily Filbert Other Clinician: Referring Provider: Emily Filbert Treating Provider/Extender: Skipper Cliche in Treatment: 6 Constitutional Well-nourished and well-hydrated in no acute distress. Respiratory normal breathing without difficulty. Psychiatric this patient is able to make decisions and demonstrates good insight into disease process. Alert and Oriented x 3. pleasant and cooperative. Notes Upon inspection patient's wound bed actually showed signs of good granulation and epithelization in regard to the right leg again on the left leg there is still some slough and biofilm noted although this is much cleaner the Verdene Lennert seems to be doing really well for her I am going to  suggest that we probably continue with this. There was some concern about a wound on her heel but to be perfectly honest I did not see anything today open there is definitely evidence of something that has been there and I know Dr. Caryl Comes is taking care of this before as well. Nonetheless right now I think that she is really making excellent progress and even that seems closed. Electronic Signature(s) Signed: 08/06/2021 5:04:22 PM By: Worthy Keeler PA-C Entered By: Worthy Keeler on 08/06/2021 17:04:22 Brittney [name]  ([id number]) -------------------------------------------------------------------------------- Physician Orders Details Patient Name: Brittney [name], [name] Date of Service: 08/06/2021 3:45 PM Medical Record Number: [id number] Patient Account Number: [id number] Date of Birth/Sex: 08/29/1951 (70 y.o. 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 [place] for wound care. May utilize formulary equivalent dressing for wound treatment orders unless otherwise specified. Home Health Nurse may visit PRN to address patientos wound care needs. o Scheduled days for dressing changes to be completed; exception, patient has scheduled wound care visit that day. - ****Unna boot applied in office today, please schedule patient home visit for Friday***** Bathing/ Shower/ Hygiene o May shower with wound dressing protected with water repellent cover or cast protector. o No tub bath. Additional Orders / Instructions o Follow Nutritious Diet and Increase Protein Intake Medications-Please add to medication list. o P.O. Antibiotics Wound Treatment Wound #1 - Lower Leg Wound Laterality:  Right Cleanser: Soap and Water 2 x Per Week/30 Days Discharge Instructions: Gently cleanse wound with antibacterial soap, rinse and pat dry prior to dressing wounds Primary Dressing: Hydrofera Blue Ready Transfer Foam, 2.5x2.5 (in/in) 2 x Per Week/30 Days Discharge Instructions: Apply Hydrofera Blue Ready to wound bed as directed Secondary Dressing: Xtrasorb Medium 4x5 (in/in) 2 x Per Week/30 Days Discharge Instructions: Apply to wound as directed. Do not cut. Compression Wrap: Unna Boot 4x10 (in/yd) 2 x Per Week/30 Days Discharge Instructions: Unna Paste, Kerlix and Coban from base of toes to three finger widths below bend in knee. Wound #2 - Lower Leg Wound Laterality: Left Cleanser: Byram Ancillary Kit - 15 Day Supply (Generic) 1 x Per Day/30 Days Discharge Instructions: Use supplies as instructed; Kit contains: (15) Saline Bullets; (15) 3x3 Gauze; 15 pr Gloves Secondary Dressing: ABD Pad 5x9 (in/in) (Generic) 1 x Per Day/30 Days Discharge Instructions: Cover with ABD pad SISSI, PADIA ([id number]) Secondary Dressing: Conforming Guaze Roll-Large (Generic) 1 x Per Day/30 Days Discharge Instructions: Moisten with Dakins and pack wound Secondary Dressing: Kerlix 4.5 x 4.1 (in/yd) (Generic) 1 x Per Day/30 Days Discharge Instructions: Apply Kerlix 4.5 x 4.1 (in/yd) as instructed Secured With: 77M Medipore H Soft Cloth Surgical Tape, 2x2 (in/yd) 1 x Per Day/30 Days Discharge Instructions: Secure dressing Secured With: Tubigrip Size F, 4x10 (in/yd) 1 x Per Day/30 Days Discharge Instructions: Apply Tubigrip  F 3 finger-widths below knee to base of toes to secure dressing and/or for swelling. Electronic Signature(s) Signed: 08/06/2021 5:38:02 PM By: Worthy Keeler PA-C Signed: 08/06/2021 8:40:56 PM By: Carlene Coria RN Entered By: Carlene Coria on 08/06/2021 16:56:26 Brittney [name] ([id number]) -------------------------------------------------------------------------------- Problem List  Details Patient Name: Brittney [name], [name] Date of Service: 08/06/2021 3:45 PM Medical Record Number: [id number] Patient Account Number: [id number] Date of Birth/Sex: 1951/08/06 (70 y.o. 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 07/30/2021 No Yes muscle I10 Essential (primary) hypertension 06/22/2021 No Yes N18.2 Chronic kidney disease, stage 2 (mild) 06/22/2021 No Yes J44.9 Chronic obstructive pulmonary disease, unspecified 06/22/2021 No Yes E11.622 Type 2 diabetes mellitus with other skin ulcer 06/22/2021 No Yes Inactive Problems Resolved Problems Electronic Signature(s) Signed: 08/06/2021 3:58:52 PM By: Worthy Keeler PA-C Entered By: Worthy Keeler on 08/06/2021 15:58:51 Brittney [name] ([id number]) -------------------------------------------------------------------------------- Progress Note Details Patient Name: Brittney Gentles B. Date of Service: 08/06/2021 3:45 PM Medical Record Number: [id number] Patient Account Number: [id number] Date of Birth/Sex: 20-Dec-1951 (70 y.o. Female) Treating RN: Carlene Coria Primary Care Provider: Emily Filbert Other Clinician: Referring Provider: Emily Filbert Treating Provider/Extender: Skipper Cliche in Treatment: 6 Subjective Chief Complaint Information obtained from Patient Right LE Hematoma following contusion with new left LE hematoma first noted on 07/30/21 History of Present Illness  (HPI) 06/22/2021 upon evaluation today patient appears to be doing somewhat poorly in regard to the wound that actually started around the beginning of May. She got a an IT trainer wheelchair which she tells me was for the most part a blessing. With that being said she unfortunately had an issue where she was in the bathroom and subsequently had left it on when she stopped to do something at the counter. In the end she actually had some issues here with running into the cabinet. She tells me that she actually hit the cabinet a couple times and could not get the chair to back up and to stop therefore she had quite a bit of damage to her leg. Her primary care provider put her in a boot on this trying to get it to reabsorb but unfortunately that is just not the case and not what we see happen. Fortunately there does not appear to be any signs of active infection at this time she is on doxycycline for a sinus infection completely separate from this issue but nonetheless that does seem to be a good antibiotic generally for skin infections and I think is at least something good for her currently. She does have some discomfort. Her daughter is present during the evaluation today. I did explain to the patient and her daughter that I do believe her get have to evacuate the hematoma this is already open and to be honest its not getting healed very well until we clear this out. 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 With that being said her hemoglobin A1c is  stated to be around 7.5 which is actually good and should not impede her healing this is great news. Overall I am pleased with what I am hearing in that regard. With that being said I still think this is going to take quite a while for this patient to heal we do need however to clear away the necrotic and nonviable/healthy  tissue. 06/30/2021 upon evaluation today patient appears to be doing well with regard to her wound although there is some signs of infection. We did have to perform a fairly significant debridement last week. Obviously this has been somewhat painful for her which I think is part of the issue but the other part I think is simply that she may have an infection here which is problematic as well. We definitely need to try to do something to improve this. I did obtain a wound culture today we will see what that shows. 07/30/21 upon evaluation today patient presents for reevaluation here in the clinic since I last saw her she's actually been in the hospital due to having fractured her left ankle region. Unfortunately she also has a significant hematoma of the left leg which has not been addressed either as far as evacuating hematoma currently. She does have a fairly tight ace wrap on the left foot and ankle region this does not go any higher however. With that being said I did discuss with the patient currently today as well as her daughter who was present that we do need to tried to clear away the necrotic tissue on the surface where the hematoma is and then subsequently evacuate as much of the hematoma as possible. With that being said there does not appear to be any evidence of infection she is on antibiotic currently. That is Bactrim DS. That's actually a good one for the time being. She's been using Dakin solution on the right anterior shin where she had a previous hematoma. Now it looks like that's probably to be the best option for the left as well. I have not actually seen her since July 5 and the reason is because she did have the fault, fractured ankle, and was in the hospital until just recently. 08/06/2021 upon evaluation today patient appears to be doing actually decently well in regard to the wound on her right leg there is a lot of healing happening and I am very pleased in that regard. In regard  to her left leg this actually is also dramatically improved compared to last week. The biggest issue she is having with her ankle at this point is that she did have a popping sensation she is not exactly sure what happened here but to be honest its been hurting her quite severely since yesterday she was not even able to get into the office at the orthopedic clinic to get this checked out to try to work on getting a mobile x-ray out. With that being said she is still having a tremendous amount of pain in the wound also on the left leg hurts as well. They have been doing the Dakin's moistened gauze dressing in an effective way and I do feel like that it is doing great at this point. There is some slough and biofilm that could require sharp debridement but to be honest she is having so much pain and so much going on that I would get and defer that at this point. Objective Constitutional Well-nourished and well-hydrated in no acute distress. Vitals Time Taken: 4:17 PM, Height: 66  in, Weight: 329 lbs, BMI: 53.1, Temperature: 98.7 F, Pulse: 76 bpm, Respiratory Rate: 18 breaths/min, Blood Pressure: 183/79 mmHg. Respiratory Pasquarelli, Beecher ([id number]) normal breathing without difficulty. Psychiatric this patient is able to make decisions and demonstrates good insight into disease process. Alert and Oriented x 3. pleasant and cooperative. General Notes: Upon inspection patient's wound bed actually showed signs of good granulation and epithelization in regard to the right leg again on the left leg there is still some slough and biofilm noted although this is much cleaner the Verdene Lennert seems to be doing really well for her I am going to suggest that we probably continue with this. There was some concern about a wound on her heel but to be perfectly honest I did not see anything today open there is definitely evidence of something that has been there and I know Dr. Caryl Comes is taking care of this before as  well. Nonetheless right now I think that she is really making excellent progress and even that seems closed. Integumentary (Hair, Skin) Wound #1 status is Open. Original cause of wound was Hematoma. The date acquired was: 04/26/2021. The wound has been in treatment 6 weeks. The wound is located on the Right Lower Leg. The wound measures 7.1cm length x 5.5cm width x 0.7cm depth; 30.67cm^2 area and 21.469cm^3 volume. There is Fat Layer (Subcutaneous Tissue) exposed. There is no tunneling or undermining noted. There is a medium amount of serosanguineous drainage noted. There is large (67-100%) red granulation within the wound bed. There is a small (1-33%) amount of necrotic tissue within the wound bed including Adherent Slough. Wound #2 status is Open. Original cause of wound was Hematoma. The date acquired was: 07/06/2021. The wound has been in treatment 1 weeks. The wound is located on the Left Lower Leg. The wound measures 8.6cm length x 6cm width x 2cm depth; 40.527cm^2 area and 81.053cm^3 volume. There is Fat Layer (Subcutaneous Tissue) exposed. There is no tunneling or undermining noted. There is a large amount of sanguinous drainage noted. There is medium (34-66%) red, pink granulation within the wound bed. There is a medium (34-66%) amount of necrotic tissue within the wound bed including Adherent Slough. 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 There was a IT consultant Compression Therapy Procedure by Carlene Coria, RN. Post procedure Diagnosis Wound #1: Same as Pre-Procedure Plan Follow-up Appointments: Return Appointment in 3 weeks. Home Health: [facility] for wound care. May utilize formulary equivalent dressing for wound treatment orders unless otherwise specified. Home Health Nurse may visit PRN to address patient s wound care needs. Scheduled days for dressing changes to be completed; exception, patient has scheduled wound care visit that day. - ****Unna boot applied in office today, please schedule patient home visit for Friday***** Bathing/ Shower/ Hygiene: May shower with wound dressing protected with water repellent cover or cast protector. No tub bath. Additional Orders / Instructions: Follow Nutritious Diet and Increase Protein Intake Medications-Please add to medication list.: Brittney [name], [name] ([id number]) P.O.  Antibiotics WOUND #1: - Lower Leg Wound Laterality: Right Cleanser: Soap and Water 2 x Per Week/30 Days Discharge Instructions: Gently cleanse wound with antibacterial soap, rinse and pat dry prior to dressing wounds Primary Dressing: Hydrofera Blue Ready Transfer Foam, 2.5x2.5 (in/in) 2 x Per Week/30 Days Discharge Instructions: Apply Hydrofera Blue Ready to wound bed as directed Secondary Dressing: Xtrasorb Medium 4x5 (in/in) 2 x Per Week/30 Days Discharge Instructions: Apply to wound as directed. Do not cut. Compression Wrap: Unna Boot 4x10 (in/yd) 2 x Per Week/30 Days Discharge Instructions: Unna Paste, Kerlix and Coban from base of toes to three finger widths below bend in knee. WOUND #2: - Lower Leg Wound Laterality: Left Cleanser: Byram Ancillary Kit - 15 Day Supply (Generic) 1 x Per Day/30 Days Discharge Instructions: Use supplies as instructed; Kit contains: (15) Saline Bullets; (15) 3x3 Gauze; 15 pr Gloves Secondary Dressing: ABD Pad 5x9 (in/in) (Generic) 1 x Per  Day/30 Days Discharge Instructions: Cover with ABD pad Secondary Dressing: Conforming Guaze Roll-Large (Generic) 1 x Per Day/30 Days Discharge Instructions: Moisten with Dakins and pack wound Secondary Dressing: Kerlix 4.5 x 4.1 (in/yd) (Generic) 1 x Per Day/30 Days Discharge Instructions: Apply Kerlix 4.5 x 4.1 (in/yd) as instructed Secured With: 38M Medipore H Soft Cloth Surgical Tape, 2x2 (in/yd) 1 x Per Day/30 Days Discharge Instructions: Secure dressing Secured With: Tubigrip Size F, 4x10 (in/yd) 1 x Per Day/30 Days Discharge Instructions: Apply Tubigrip F 3 finger-widths below knee to base of toes to secure dressing and/or for swelling. 1. I would recommend currently that we continue with the Dakin's moistened gauze packing and guard to the anterior shin left leg. 2. I am also can recommend that we continue with the Hydrofera Blue to the right leg anteriorly. 3. I would recommend as well that we have the patient continue with the Unna boot wrap to the right leg. 4. I am also going to suggest that we utilize Tubigrip on the left leg which seems to be doing a good job at this point. 5. I am also going to have the home health nurse continue to monitor for any signs of worsening from the standpoint of infection. Due to the fact that the patient is having such a hard time even getting out to come to appointments I think that seeing about a mobile x-ray is a good idea and then orthopedics can make any adjustments as necessary there. With that being said from the standpoint of the heel I do not see anything open at this point I think that this is just something to keep an eye on. 6. The patient tells me that she has been to be getting a hospital bed to try to help with limiting the amount of time she needs to get up and down and around. With that being said I think this could be helpful but I did advise her to make sure to change positions frequently so as not to cause any issues with wounds on her  gluteal region. That is can be of utmost importance. She also needs to protect his heel on the left side really both sides. We will see patient back for reevaluation in 3 weeks here in the clinic. If anything worsens or changes patient will contact our office for additional recommendations. Electronic Signature(s) Signed: 08/06/2021 5:08:00 PM By: Worthy Keeler PA-C Entered By: Worthy Keeler on 08/06/2021 17:08:00 Brittney [name] ([id number]) -------------------------------------------------------------------------------- SuperBill Details Patient Name: Brittney Gentles B. Date of Service: 08/06/2021 Medical Record  Number: [id number] Patient Account Number: [id number] Date of Birth/Sex: 1951-08-03 (70 y.o. 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, stage 2 (mild) J44.9 Chronic obstructive pulmonary disease, unspecified E11.622 Type 2 diabetes mellitus with other skin ulcer Facility Procedures CPT4 Code: 20233435 Description: (Facility Use Only) [phone] - APPLY Louretta Parma BOOT RT Modifier: Quantity: 1 Physician Procedures CPT4 Code: 7290211 Description: 15520 - WC PHYS LEVEL 4 - EST PT Modifier: Quantity: 1 CPT4 Code: Description: ICD-10 Diagnosis Description S80.11XA Contusion of right lower leg, initial encounter 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 with unspeci S80.12XA Contusion of left lower leg, initial  encounter L97.823  Non-pressure chronic ulcer of other part of left lower leg with necrosis Modifier: fied severity of muscle Quantity: Electronic Signature(s) Signed: 08/06/2021 5:08:14 PM By: Worthy Keeler PA-C Entered By: Worthy Keeler on 08/06/2021 17:08:13

## 2021-08-06 NOTE — Progress Notes (Addendum)
RHYS, ANCHONDO ([id number]) Visit Report for 08/06/2021 Arrival Information Details Patient Name: Brittney [name], Brittney [name]. Date of Service: 08/06/2021 3:45 PM Medical Record Number: [id number] Patient Account Number: [id number] Date of Birth/Sex: Nov 17, 1951 (70 y.o. Female) Treating RN: Carlene Coria Primary Care Milynn Quirion: Emily Filbert Other Clinician: Referring Hardeep Reetz: Emily Filbert Treating Antwine Agosto/Extender: Skipper Cliche in Treatment: 6 Visit Information History Since Last Visit All ordered tests and consults were completed: No Patient Arrived: Wheel Chair Added or deleted any medications: No Arrival Time: 16:17 Any new allergies or adverse reactions: No Accompanied By: daughter Had a fall or experienced change in No Transfer Assistance: None activities of daily living that may affect Patient Identification Verified: Yes risk of falls: Secondary Verification Process Completed: Yes Signs or symptoms of abuse/neglect since last visito No Patient Has Alerts: Yes Hospitalized since last visit: No Patient Alerts: Type II Diabetic Implantable device outside of the clinic excluding No cellular tissue based products placed in the center since last visit: Has Dressing in Place as Prescribed: Yes Has Compression in Place as Prescribed: Yes Pain Present Now: Yes Electronic Signature(s) Signed: 08/06/2021 8:40:56 PM By: Carlene Coria RN Entered By: Carlene Coria on 08/06/2021 16:17:42 [name] ([id number]) -------------------------------------------------------------------------------- Clinic Level of Care Assessment Details Patient Name: Brittney Gentles B. Date of Service: 08/06/2021 3:45 PM Medical Record Number: [id number] Patient Account Number: [id number] Date of Birth/Sex: [birth year] (70 y.o. Female) Treating RN: Carlene Coria Primary Care Bryttani Blew: Emily Filbert Other Clinician: Referring Asaph Serena: Emily Filbert Treating Saron Vanorman/Extender: Skipper Cliche in Treatment:  6 Clinic Level of Care Assessment Items TOOL 1 Quantity Score _0  - Use when EandM and Procedure is performed on INITIAL visit 0 ASSESSMENTS - Nursing Assessment / Reassessment _1  - General Physical Exam (combine w/ comprehensive assessment (listed just below) when performed on new 0 pt. evals) _2  - 0 Comprehensive Assessment (HX, ROS, Risk Assessments, Wounds Hx, etc.) ASSESSMENTS - Wound and Skin Assessment / Reassessment _3  - Dermatologic / Skin Assessment (not related to wound area) 0 ASSESSMENTS - Ostomy and/or Continence Assessment and Care _4  - Incontinence Assessment and Management 0 _5  - 0 Ostomy Care Assessment and Management (repouching, etc.) PROCESS - Coordination of Care _6  - Simple Patient / Family Education for ongoing care 0 _7  - 0 Complex (extensive) Patient / Family Education for ongoing care _8  - 0 Staff obtains Programmer, systems, Records, Test Results / Process Orders _9  - 0 Staff telephones HHA, Nursing Homes / Clarify orders / etc _10  - 0 Routine Transfer to another Facility (non-emergent condition) _11  - 0 Routine Hospital Admission (non-emergent condition) _12  - 0 New Admissions / Biomedical engineer / Ordering NPWT, Apligraf, etc. _13  - 0 Emergency Hospital Admission (emergent condition) PROCESS - Special Needs _14  - Pediatric / Minor Patient Management 0 _15  - 0 Isolation Patient Management _16  - 0 Hearing / Language / Visual special needs _17  - 0 Assessment of Community assistance (transportation, D/C planning, etc.) _18  - 0 Additional assistance / Altered mentation _19  - 0 Support Surface(s) Assessment (bed, cushion, seat, etc.) INTERVENTIONS - Miscellaneous _20  - External ear exam 0 _21  - 0 Patient Transfer (multiple staff / Civil Service fast streamer / Similar devices) _22  - 0 Simple Staple / Suture removal (25 or less) _23  - 0 Complex Staple / Suture removal (26 or more) _24  - 0 Hypo/Hyperglycemic Management (do not check if billed separately) _25  - 0 Ankle /  Brachial Index (ABI) - do not check if billed separately Has the patient been seen at the hospital within the  last three years: Yes Total Score: 0 Level Of Care: ____ [name] ([id number]) Electronic Signature(s) Signed: 08/06/2021 8:40:56 PM By: Carlene Coria RN Entered By: Carlene Coria on 08/06/2021 16:56:40 [name] ([id number]) -------------------------------------------------------------------------------- Compression Therapy Details Patient Name: Brittney [name], Brittney [name] B. Date of Service: 08/06/2021 3:45 PM Medical Record Number: [id number] Patient Account Number: [id number] Date of Birth/Sex: Jul 01, 1951 (70 y.o. Female) Treating RN: Carlene Coria Primary Care Omolara Carol: Emily Filbert Other Clinician: Referring Sargent Mankey: Emily Filbert Treating Denarius Sesler/Extender: Skipper Cliche in Treatment: 6 Compression Therapy Performed for Wound Assessment: Wound #1 Right Lower Leg Performed By: Clinician Carlene Coria, RN Compression Type: Rolena Infante Post Procedure Diagnosis Same as Pre-procedure Electronic Signature(s) Signed: 08/06/2021 8:40:56 PM By: Carlene Coria RN Entered By: Carlene Coria on 08/06/2021 16:55:26 [name] ([id number]) -------------------------------------------------------------------------------- Encounter Discharge Information Details Patient Name: MARQUELLE, BALOW B. Date of Service: 08/06/2021 3:45 PM Medical Record Number: [id number] Patient Account Number: [id number] Date of Birth/Sex: [birth year] (70 y.o. Female) Treating RN: Carlene Coria Primary Care Zaiah Credeur: Emily Filbert Other Clinician: Referring Manasa Spease: Emily Filbert Treating Nafisa Olds/Extender: Skipper Cliche in Treatment: 6 Encounter Discharge Information Items Discharge Condition: Stable Ambulatory Status: Ambulatory Discharge Destination: Home Transportation: Private Auto Accompanied By: self Schedule Follow-up Appointment: Yes Clinical Summary of Care: Patient Declined Electronic  Signature(s) Signed: 08/06/2021 8:40:56 PM By: Carlene Coria RN Entered By: Carlene Coria on 08/06/2021 16:57:54 [name] ([id number]) -------------------------------------------------------------------------------- Lower Extremity Assessment Details Patient Name: Brittney [name], Brittney [name] B. Date of Service: 08/06/2021 3:45 PM Medical Record Number: [id number] Patient Account Number: [id number] Date of Birth/Sex: May 14, 1951 (70 y.o. Female) Treating RN: Carlene Coria Primary Care Marylynne Keelin: Emily Filbert Other Clinician: Referring Charma Mocarski: Emily Filbert Treating Biana Haggar/Extender: Skipper Cliche in Treatment: 6 Electronic Signature(s) Signed: 08/06/2021 8:40:56 PM By: Carlene Coria RN Entered By: Carlene Coria on 08/06/2021 [facility] ([id number]) -------------------------------------------------------------------------------- Multi Wound Chart Details Patient Name: KYNESHA, GUERIN B. Date of Service: 08/06/2021 3:45 PM Medical Record Number: [id number] Patient Account Number: [id number] Date of Birth/Sex: 28-Jan-1951 (70 y.o. Female) Treating RN: Carlene Coria Primary Care Shawndra Clute: Emily Filbert Other Clinician: Referring Kortni Hasten: Emily Filbert Treating Eupha Lobb/Extender: Skipper Cliche in Treatment: 6 Vital Signs Height(in): 71 Pulse(bpm): 46 Weight(lbs): 329 Blood Pressure(mmHg): 183/79 Body Mass Index(BMI): 53 Temperature(F): 98.7 Respiratory Rate(breaths/min): 18 Photos: [N/A:N/A] Wound Location: Right Lower Leg Left Lower Leg N/A Wounding Event: Hematoma Hematoma N/A Primary Etiology: Trauma, Other Trauma, Other N/A Comorbid History: Anemia, Lymphedema, Chronic Anemia, Lymphedema, Chronic N/A Obstructive Pulmonary Disease Obstructive Pulmonary Disease (COPD), Sleep Apnea, (COPD), Sleep Apnea, Hypertension, Myocardial Infarction, Hypertension, Myocardial Infarction, Cirrhosis , Type II Diabetes, Cirrhosis , Type II Diabetes, Osteoarthritis, Neuropathy  Osteoarthritis, Neuropathy Date Acquired: 04/26/2021 07/06/2021 N/A Weeks of Treatment: 6 1 N/A Wound Status: Open Open N/A Clustered Wound: Yes No N/A Clustered Quantity: 2 N/A N/A Measurements L x W x D (cm) 7.1x5.5x0.7 8.6x6x2 N/A Area (cm) : 30.67 40.527 N/A Volume (cm) : 21.469 81.053 N/A % Reduction in Area: 2.40% -34.00% N/A % Reduction in Volume: 2.40% -235.10% N/A Classification: Full Thickness Without Exposed Full Thickness Without Exposed N/A Support Structures Support Structures Exudate Amount: Medium Large N/A Exudate Type: Serosanguineous Sanguinous N/A Exudate Color: red, brown red N/A Granulation Amount: Large (67-100%) Medium (34-66%) N/A Granulation Quality: Red Red, Pink N/A Necrotic Amount: Small (1-33%) Medium (34-66%) N/A Exposed Structures: Fat Layer (Subcutaneous Tissue): Fat Layer (Subcutaneous Tissue): N/A Yes Yes Fascia: No Fascia: No Tendon: No Tendon: No Muscle: No Muscle: No Joint: No Joint: No Bone: No Bone:  No Epithelialization: Medium (34-66%) None N/A Treatment Notes Electronic Signature(s) Signed: 08/06/2021 8:40:56 PM By: Carlene Coria RN Brittney [name], [name] ([id number]) Entered By: Carlene Coria on 08/06/2021 16:48:42 [name] ([id number]) -------------------------------------------------------------------------------- [place] Details Patient Name: CALIROSE, MCCANCE B. Date of Service: 08/06/2021 3:45 PM Medical Record Number: [id number] Patient Account Number: [id number] Date of Birth/Sex: 06-08-1951 (70 y.o. Female) Treating RN: Carlene Coria Primary Care Shalayne Leach: Emily Filbert Other Clinician: Referring Bernadette Gores: Emily Filbert Treating Stephania Macfarlane/Extender: Skipper Cliche in Treatment: 6 Active Inactive Wound/Skin Impairment Nursing Diagnoses: Impaired tissue integrity Knowledge deficit related to smoking impact on wound healing Knowledge deficit related to ulceration/compromised skin  integrity Goals: Ulcer/skin breakdown will have a volume reduction of 30% by week 4 Date Initiated: 06/22/2021 Target Resolution Date: 07/20/2021 Goal Status: Active Ulcer/skin breakdown will have a volume reduction of 50% by week 8 Date Initiated: 06/22/2021 Target Resolution Date: 08/17/2021 Goal Status: Active Ulcer/skin breakdown will have a volume reduction of 80% by week 12 Date Initiated: 06/22/2021 Target Resolution Date: 09/14/2021 Goal Status: Active Interventions: Assess patient/caregiver ability to obtain necessary supplies Assess patient/caregiver ability to perform ulcer/skin care regimen upon admission and as needed Assess ulceration(s) every visit Notes: Electronic Signature(s) Signed: 08/06/2021 8:40:56 PM By: Carlene Coria RN Entered By: Carlene Coria on 08/06/2021 16:48:29 [name] ([id number]) -------------------------------------------------------------------------------- Pain Assessment Details Patient Name: Brittney Gentles B. Date of Service: 08/06/2021 3:45 PM Medical Record Number: [id number] Patient Account Number: [id number] Date of Birth/Sex: March 03, 1951 (70 y.o. Female) Treating RN: Carlene Coria Primary Care Rolonda Pontarelli: Emily Filbert Other Clinician: Referring Tiya Schrupp: Emily Filbert Treating Momodou Consiglio/Extender: Skipper Cliche in Treatment: 6 Active Problems Location of Pain Severity and Description of Pain Patient Has Paino Yes Site Locations With Dressing Change: Yes Duration of the Pain. Constant / Intermittento Constant Rate the pain. Current Pain Level: 5 Worst Pain Level: 10 Least Pain Level: 4 Tolerable Pain Level: 5 Character of Pain Describe the Pain: Other: bee sting 1000 Pain Management and Medication Current Pain Management: Medication: Yes Cold Application: No Rest: Yes Massage: No Activity: No T.E.N.S.: No Heat Application: No Leg drop or elevation: No Is the Current Pain Management Adequate: Inadequate How does your wound  impact your activities of daily livingo Sleep: Yes Bathing: No Appetite: Yes Relationship With Others: No Bladder Continence: No Emotions: No Bowel Continence: No Work: No Toileting: No Drive: No Dressing: No Hobbies: No Electronic Signature(s) Signed: 08/06/2021 8:40:56 PM By: Carlene Coria RN Entered By: Carlene Coria on 08/06/2021 16:19:47 [name] ([id number]) -------------------------------------------------------------------------------- Patient/Caregiver Education Details Patient Name: Brittney Gentles B. Date of Service: 08/06/2021 3:45 PM Medical Record Number: [id number] Patient Account Number: [id number] Date of Birth/Gender: September 25, 1951 (70 y.o. Female) Treating RN: Carlene Coria Primary Care Physician: Emily Filbert Other Clinician: Referring Physician: Emily Filbert Treating Physician/Extender: Skipper Cliche in Treatment: 6 Education Assessment Education Provided To: Patient Education Topics Provided Wound/Skin Impairment: Methods: Explain/Verbal Responses: State content correctly Electronic Signature(s) Signed: 08/06/2021 8:40:56 PM By: Carlene Coria RN Entered By: Carlene Coria on 08/06/2021 16:57:19 [name] ([id number]) -------------------------------------------------------------------------------- Wound Assessment Details Patient Name: Brittney [name], Brittney [name] B. Date of Service: 08/06/2021 3:45 PM Medical Record Number: [id number] Patient Account Number: [id number] Date of Birth/Sex: [birth year] (70 y.o. Female) Treating RN: Carlene Coria Primary Care Senora Lacson: Emily Filbert Other Clinician: Referring Zaiyah Sottile: Emily Filbert Treating Lyana Asbill/Extender: Skipper Cliche in Treatment: 6 Wound Status Wound Number: 1 Primary Trauma, Other Etiology: Wound Location: Right Lower Leg Wound Open Wounding Event: Hematoma Status: Date Acquired: 04/26/2021 Comorbid  Anemia, Lymphedema, Chronic Obstructive Pulmonary Weeks Of Treatment: 6 History: Disease  (COPD), Sleep Apnea, Hypertension, Myocardial Clustered Wound: Yes Infarction, Cirrhosis , Type II Diabetes, Osteoarthritis, Neuropathy Photos Wound Measurements Length: (cm) 7.1 Width: (cm) 5.5 Depth: (cm) 0.7 Clustered Quantity: 2 Area: (cm) 30.67 Volume: (cm) 21.469 % Reduction in Area: 2.4% % Reduction in Volume: 2.4% Epithelialization: Medium (34-66%) Tunneling: No Undermining: No Wound Description Classification: Full Thickness Without Exposed Support Structures Exudate Amount: Medium Exudate Type: Serosanguineous Exudate Color: red, brown Foul Odor After Cleansing: No Slough/Fibrino No Wound Bed Granulation Amount: Large (67-100%) Exposed Structure Granulation Quality: Red Fascia Exposed: No Necrotic Amount: Small (1-33%) Fat Layer (Subcutaneous Tissue) Exposed: Yes Necrotic Quality: Adherent Slough Tendon Exposed: No Muscle Exposed: No Joint Exposed: No Bone Exposed: No Treatment Notes Wound #1 (Lower Leg) Wound Laterality: Right Cleanser Soap and Water Discharge Instruction: Gently cleanse wound with antibacterial soap, rinse and pat dry prior to dressing wounds Brittney [name], [name] ([id number]) Peri-Wound Care Topical Primary Dressing Hydrofera Blue Ready Transfer Foam, 2.5x2.5 (in/in) Discharge Instruction: Apply Hydrofera Blue Ready to wound bed as directed Secondary Dressing Xtrasorb Medium 4x5 (in/in) Discharge Instruction: Apply to wound as directed. Do not cut. Secured With Compression Wrap Unna Boot 4x10 (in/yd) Discharge Instruction: Unna Paste, Kerlix and Coban from base of toes to three finger widths below bend in knee. Compression Stockings Add-Ons Electronic Signature(s) Signed: 08/06/2021 8:40:56 PM By: Carlene Coria RN Entered By: Carlene Coria on 08/06/2021 16:38:15 [name] ([id number]) -------------------------------------------------------------------------------- Wound Assessment Details Patient Name: Brittney [name], Brittney [name]  B. Date of Service: 08/06/2021 3:45 PM Medical Record Number: [id number] Patient Account Number: [id number] Date of Birth/Sex: 08-Aug-1951 (70 y.o. Female) Treating RN: Carlene Coria Primary Care Cristiano Capri: Emily Filbert Other Clinician: Referring Tevon Berhane: Emily Filbert Treating Simran Bomkamp/Extender: Skipper Cliche in Treatment: 6 Wound Status Wound Number: 2 Primary Trauma, Other Etiology: Wound Location: Left Lower Leg Wound Open Wounding Event: Hematoma Status: Date Acquired: 07/06/2021 Comorbid Anemia, Lymphedema, Chronic Obstructive Pulmonary Weeks Of Treatment: 1 History: Disease (COPD), Sleep Apnea, Hypertension, Myocardial Clustered Wound: No Infarction, Cirrhosis , Type II Diabetes, Osteoarthritis, Neuropathy Photos Wound Measurements Length: (cm) 8.6 Width: (cm) 6 Depth: (cm) 2 Area: (cm) 40.527 Volume: (cm) 81.053 % Reduction in Area: -34% % Reduction in Volume: -235.1% Epithelialization: None Tunneling: No Undermining: No Wound Description Classification: Full Thickness Without Exposed Support Structures Exudate Amount: Large Exudate Type: Sanguinous Exudate Color: red Foul Odor After Cleansing: No Slough/Fibrino No Wound Bed Granulation Amount: Medium (34-66%) Exposed Structure Granulation Quality: Red, Pink Fascia Exposed: No Necrotic Amount: Medium (34-66%) Fat Layer (Subcutaneous Tissue) Exposed: Yes Necrotic Quality: Adherent Slough Tendon Exposed: No Muscle Exposed: No Joint Exposed: No Bone Exposed: No Treatment Notes Wound #2 (Lower Leg) Wound Laterality: Left Cleanser [place] Ancillary Kit - 15 Day Supply Discharge Instruction: Use supplies as instructed; Kit contains: (15) Saline Bullets; (15) 3x3 Gauze; 15 pr Gloves Peri-Wound Care Brittney [name], Brittney [name] ([id number]) Topical Primary Dressing Secondary Dressing ABD Pad 5x9 (in/in) Discharge Instruction: Cover with ABD pad Phelps Discharge Instruction: Moisten with Dakins  and pack wound Kerlix 4.5 x 4.1 (in/yd) Discharge Instruction: Apply Kerlix 4.5 x 4.1 (in/yd) as instructed Secured With 50M Medipore H Soft Cloth Surgical Tape, 2x2 (in/yd) Discharge Instruction: Secure dressing Tubigrip Size F, 4x10 (in/yd) Discharge Instruction: Apply Tubigrip F 3 finger-widths below knee to base of toes to secure dressing and/or for swelling. Compression Wrap Compression Stockings Add-Ons Electronic Signature(s) Signed: 08/06/2021 8:40:56 PM By: Carlene Coria RN Entered By: Carlene Coria  on 08/06/2021 16:39:39 Brittney [name], Brittney [name] ([id number]) -------------------------------------------------------------------------------- [place] Details Patient Name: Brittney [name], Brittney [name]. Date of Service: 08/06/2021 3:45 PM Medical Record Number: [id number] Patient Account Number: [id number] Date of Birth/Sex: [birth year] (70 y.o. Female) Treating RN: Carlene Coria Primary Care Geonna Lockyer: Emily Filbert Other Clinician: Referring Mylin Gignac: Emily Filbert Treating Skylarr Liz/Extender: Skipper Cliche in Treatment: 6 Vital Signs Time Taken: 16:17 Temperature (F): 98.7 Height (in): 66 Pulse (bpm): 76 Weight (lbs): 329 Respiratory Rate (breaths/min): 18 Body Mass Index (BMI): 53.1 Blood Pressure (mmHg): 183/79 Reference Range: 80 - 120 mg / dl Electronic Signature(s) Signed: 08/06/2021 8:40:56 PM By: Carlene Coria RN Entered By: Carlene Coria on 08/06/2021 16:18:04

## 2021-08-06 NOTE — Progress Notes (Signed)
Patient: Brittney [name]  Service Category: E/M  Provider: Bilal Lateef, MD  DOB: 05/21/1951  DOS: 08/06/2021  Location: Office  MRN: [id number]  Setting: Ambulatory outpatient  Referring Provider: Miller, Mark F, MD  Type: Established Patient  Specialty: Interventional Pain Management  PCP: Miller, Mark F, MD  Location: Remote location  Delivery: TeleHealth     Virtual Encounter - Pain Management PROVIDER NOTE: Information contained herein reflects review and annotations entered in association with encounter. Interpretation of such information and data should be left to medically-trained personnel. Information provided to patient can be located elsewhere in the medical record under "Patient Instructions". Document created using STT-dictation technology, any transcriptional errors that may result from process are unintentional.    Contact & Pharmacy Preferred: [phone] Home: [phone] (home) Mobile: [phone] (mobile) E-mail: [email or web address]  [id number] - [place], [place] - [address] [place] [address] Phone: [phone] Fax: [phone]   Pre-screening  [name] offered "in-person" vs "virtual" encounter. She indicated preferring virtual for this encounter.   Reason COVID-19*  Social distancing based on CDC and AMA recommendations.   I contacted Kambrie B Kuhlmann on 08/06/2021 via video conference.      I clearly identified myself as Bilal Lateef, MD. I verified that I was speaking with the correct person using two identifiers (Name: Brittney [name], and date of birth: 09/16/1951).  Consent I sought verbal advanced consent from Delcenia B Matassa for virtual visit interactions. I informed [name] of possible security and privacy concerns, risks, and limitations associated with providing "not-in-person" medical evaluation and management services. I also informed [name] of the availability of "in-person" appointments. Finally, I informed her that  there would be a charge for the virtual visit and that she could be  personally, fully or partially, financially responsible for it. Ms. Ringel expressed understanding and agreed to proceed.   Historic Elements   [name] is a 70 y.o. year old, female patient evaluated today after our last contact on 04/09/2021. Ms. Stock  has a past medical history of Abdominal pain, left upper quadrant (12/11/2012), Anemia, Arthritis, Asthma, Broken leg (2014), Cancer (HCC) (2007), Chronic kidney disease, stage 2 (mild), Chronic lower back pain, Collagen vascular disease (HCC), Complication of anesthesia, Coronary artery disease, Diabetes mellitus without complication (HCC), Diabetic nephropathy associated with secondary diabetes mellitus (HCC), Flushing (12/11/2012), Gastroparesis, Gross hematuria (12/11/2012), Hepatic cirrhosis (HCC), History of kidney stones, Hyperlipidemia, Hypertension, Hypothyroidism, Hypothyroidism, IBS (irritable bowel syndrome), Kidney stone (12/11/2012), Lower extremity edema, Morbid obesity with BMI of 40.0-44.9, adult (HCC), Motion sickness, MRSA (methicillin resistant staph aureus) culture positive (2016), Myocardial infarction (HCC) (09/2013), Nausea without vomiting (12/11/2012), Nephrolithiasis, Nephrolithiasis (04/26/2014), Nonproliferative retinopathy due to secondary diabetes (HCC), Numbness and tingling of right leg, Peripheral vascular disease (HCC), Renal colic (12/11/2012), Renal insufficiency, Sciatica, Sciatica (12/11/2012), Skin cancer (08/2018), Sleep apnea, Spinal cord stimulator status, Stroke (HCC) (07/2020), Unilateral small kidney without contralateral hypertrophy, Wears dentures, and Wheelchair dependent. She also  has a past surgical history that includes Cesarean section (1980); Dilation and curettage of uterus; Eye surgery (1986); Abdominal hysterectomy; Tonsillectomy; Esophagogastroduodenoscopy (02/08/2014); Colonoscopy (05/04/2001); Esophagogastroduodenoscopy  (egd) with propofol (N/A, 09/20/2016); Colonoscopy with propofol (N/A, 09/20/2016); Mohs surgery (11/2016); Cholecystectomy; Hernia repair; Joint replacement (Left, 09/24/2013); Back surgery (04/1999); Pulse generator implant (N/A, 11/02/2017); Esophagogastroduodenoscopy (egd) with propofol (N/A, 05/23/2018); and Esophagogastroduodenoscopy (egd) with propofol (N/A, 07/18/2020). [name] has a current medication list which includes the following prescription(s): albuterol, xanax, aspirin ec, budesonide, carvedilol,   vitamin d-3, clonidine, cyclobenzaprine, diclofenac sodium, doxycycline, escitalopram, fluticasone, furosemide, gabapentin, insulin pump, ipratropium-albuterol, klor-con m10, levothyroxine, linaclotide, magnesium oxide, ondansetron, pantoprazole, pramipexole, telmisartan, tramadol, trazodone, vitamin c, zolpidem, metolazone, and oxycodone-acetaminophen. She  reports that she has never smoked. She has never used smokeless tobacco. She reports that she does not drink alcohol and does not use drugs. [name] is allergic to shellfish allergy, cephalexin, amlodipine, codeine, imdur [isosorbide dinitrate], lyrica [pregabalin], monosodium glutamate, and tape.   HPI  Today, she is being contacted for medication management.  left distal fibula and medial malleolus fracture. Injury occurred 07/07/2021.   Was evaluated in the emergency department by Dr. Michael Menz who recommended nonoperative treatment options due to her significant lymphedema and wound issues in her left leg Has hematoma on left thigh, has been seeing wound care for this Virtual visit today as the patient is having pretty severe pain with weightbearing for her left foot.  She is utilizing Percocet 10 mg up to twice a day for pain. She continues gabapentin as prescribed.   Pharmacotherapy Assessment   Analgesic: Percocet 10 mg twice daily as needed, quantity 60/month   Monitoring: [place] PMP: PDMP reviewed during this encounter.        Pharmacotherapy: No side-effects or adverse reactions reported. Compliance: No problems identified. Effectiveness: Clinically acceptable. Plan: Refer to "POC". UDS:  Summary  Date Value Ref Range Status  02/12/2021 Note  Final    Comment:    ==================================================================== [employer] 13 (MW) ==================================================================== Test                             Result       Flag       Units  Drug Present and Declared for Prescription Verification   Alprazolam                     382          EXPECTED   ng/mg creat   Alpha-hydroxyalprazolam        1128         EXPECTED   ng/mg creat    Source of alprazolam is a scheduled prescription medication. Alpha-    hydroxyalprazolam is an expected metabolite of alprazolam.    Oxycodone                      1151         EXPECTED   ng/mg creat   Oxymorphone                    889          EXPECTED   ng/mg creat   Noroxycodone                   1083         EXPECTED   ng/mg creat   Noroxymorphone                 323          EXPECTED   ng/mg creat    Sources of oxycodone are scheduled prescription medications.    Oxymorphone, noroxycodone, and noroxymorphone are expected    metabolites of oxycodone. Oxymorphone is also available as a    scheduled prescription medication.    Tramadol                       >  3311        EXPECTED   ng/mg creat   O-Desmethyltramadol            >3311        EXPECTED   ng/mg creat   N-Desmethyltramadol            1035         EXPECTED   ng/mg creat    Source of tramadol is a prescription medication. O-desmethyltramadol    and N-desmethyltramadol are expected metabolites of tramadol.  Drug Absent but Declared for Prescription Verification   Butalbital                     Not Detected UNEXPECTED ==================================================================== Test                      Result    Flag   Units      Ref Range   Creatinine               151              mg/dL      >=20 ==================================================================== Declared Medications:  The flagging and interpretation on this report are based on the  following declared medications.  Unexpected results may arise from  inaccuracies in the declared medications.   **Note: The testing scope of this panel includes these medications:   Alprazolam (Xanax)  Butalbital (Fioricet)  Oxycodone (Percocet)  Tramadol (Ultram)   **Note: The testing scope of this panel does not include the  following reported medications:   Acetaminophen (Fioricet)  Acetaminophen (Percocet)  Albuterol (Ventolin HFA)  Albuterol (Duoneb)  Budesonide (Pulmicort)  Caffeine (Fioricet)  Carvedilol (Coreg)  Clonidine (Catapres)  Cyclobenzaprine (Flexeril)  Doxazosin (Cardura)  Escitalopram (Lexapro)  Fluticasone (Flonase)  Furosemide (Lasix)  Gabapentin (Neurontin)  Hydralazine (Apresoline)  Ibuprofen (Advil)  Insulin (Humulin)  Ipratropium (Duoneb)  Levothyroxine (Synthroid)  Linaclotide (Linzess)  Metoclopramide (Reglan)  Metolazone (Zaroxolyn)  Ondansetron (Zofran)  Pantoprazole (Protonix)  Potassium (Klor-Con)  Pramipexole (Mirapex)  Telmisartan (Micardis)  Trazodone (Desyrel)  Zolpidem (Ambien) ==================================================================== For clinical consultation, please call [phone]. ====================================================================      Laboratory Chemistry Profile   Renal Lab Results  Component Value Date   BUN 55 (H) 07/17/2021   CREATININE 1.53 (H) 07/17/2021   GFRAA >60 08/21/2020   GFRNONAA 37 (L) 07/17/2021    Hepatic Lab Results  Component Value Date   AST 27 07/07/2021   ALT 23 07/07/2021   ALBUMIN 3.1 (L) 07/07/2021   ALKPHOS 78 07/07/2021   LIPASE 114 10/22/2014    Electrolytes Lab Results  Component Value Date   NA 133 (L) 07/17/2021   K 4.2 07/17/2021   CL 97 (L)  07/17/2021   CALCIUM 10.6 (H) 07/17/2021   MG 2.0 07/09/2021   PHOS 3.0 08/21/2020    Bone Lab Results  Component Value Date   25OHVITD1 26 (L) 12/07/2016   25OHVITD2 1.2 12/07/2016   25OHVITD3 25 12/07/2016    Inflammation (CRP: Acute Phase) (ESR: Chronic Phase) Lab Results  Component Value Date   CRP 1.2 (H) 12/07/2016   ESRSEDRATE 45 (H) 08/18/2020   LATICACIDVEN 1.5 08/17/2020         Note: Above Lab results reviewed.  Imaging  DG Foot Complete Right CLINICAL DATA:  Fall, no diabetes and recent left foot infection.  EXAM: RIGHT FOOT COMPLETE - 3+ VIEW  COMPARISON:  Radiograph 07/07/2021  FINDINGS: Overlying bandaging material is present   which may obscure fine bone and soft-tissue detail. Evaluation further limited by diffuse bony demineralization. There is extensive soft tissue swelling. No visible radiopaque foreign body. Irregularity of the first digit nail plate, correlate with visual inspection. Age-indeterminate fracture involving the lateral base of the first proximal phalanx. Additional lucency/fracture lines through the head of the fourth and fifth proximal phalanges as well as the fifth middle and distal phalanges as well. No acute osseous erosion or destructive changes are discernible to suggest the presence of acute osteomyelitis. Diffuse degenerative changes throughout the foot and bidirectional calcaneal spurs are present.  IMPRESSION: 1. Technically challenging exam due to overlying bandaging material and diffuse bony demineralization which may limit detection of subtle osseous abnormalities. 2. Diffuse soft tissue swelling. No clear radiographic evidence of osteomyelitis. 3. Subacute appearing fracture involving the lateral base of the first distal phalanx, present on comparison radiograph. 4. Additional suspicious lucencies extending through the fourth and fifth proximal phalanges as well as the fifth middle and distal phalanges possibly  reflecting additional sites of fracture. Correlate for point tenderness. 5. Diffuse degenerative changes throughout the foot. 6. Bidirectional calcaneal spurs.  Electronically Signed   By: Price  DeHay M.D.   On: 07/17/2021 21:01 DG Ankle Right Port CLINICAL DATA:  Weakness and recent falls.  EXAM: PORTABLE RIGHT ANKLE - 2 VIEW  COMPARISON:  July 07, 2021 and October 22, 2014  FINDINGS: The right ankle was imaged in a partially radiopaque bandage with subsequently obscured osseous and soft tissue detail. There is no evidence of an acute fracture, dislocation, or joint effusion. A chronic fracture deformity is seen involving the right lateral malleolus. A moderate sized plantar calcaneal spur is noted. Mild degenerative changes are seen along the dorsal aspect of the mid right foot. There is stable diffuse soft tissue swelling.  IMPRESSION: 1. Chronic fracture of the right lateral malleolus. 2. Diffuse soft tissue swelling which is likely, in part, secondary to the patient's body habitus.  Electronically Signed   By: Thaddeus  Houston M.D.   On: 07/17/2021 21:00 DG Foot Complete Left CLINICAL DATA:  70-year-old female with trauma to the left lower extremity.  EXAM: PORTABLE LEFT ANKLE - 2 VIEW; LEFT FOOT - COMPLETE 3+ VIEW  COMPARISON:  Left ankle radiograph dated 07/07/2021.  FINDINGS: Mildly displaced oblique fracture of the distal fibula as seen on the prior radiograph. No new fracture. The bones are osteopenic. There is diffuse soft tissue swelling of the foot predominantly over the dorsum of the forefoot. No radiopaque foreign object or soft tissue gas.  IMPRESSION: 1. Mildly displaced oblique fracture of the distal fibula, similar to prior radiograph. No new fracture. 2. Soft tissue swelling of the dorsum of the forefoot.  Electronically Signed   By: Arash  Radparvar M.D.   On: 07/17/2021 20:57 DG Ankle Left Port CLINICAL DATA:  70-year-old female with  trauma to the left lower extremity.  EXAM: PORTABLE LEFT ANKLE - 2 VIEW; LEFT FOOT - COMPLETE 3+ VIEW  COMPARISON:  Left ankle radiograph dated 07/07/2021.  FINDINGS: Mildly displaced oblique fracture of the distal fibula as seen on the prior radiograph. No new fracture. The bones are osteopenic. There is diffuse soft tissue swelling of the foot predominantly over the dorsum of the forefoot. No radiopaque foreign object or soft tissue gas.  IMPRESSION: 1. Mildly displaced oblique fracture of the distal fibula, similar to prior radiograph. No new fracture. 2. Soft tissue swelling of the dorsum of the forefoot.  Electronically Signed   By: Arash    Radparvar M.D.   On: 07/17/2021 20:57  Assessment  The primary encounter diagnosis was Closed nondisplaced fracture of medial malleolus of left tibia, sequela. Diagnoses of Chronic pain of left ankle, Fibromyalgia, Spinal stenosis of thoracic region, Status post lumbar spine surgery for decompression of spinal cord, Failed back surgical syndrome, L3-4 severe lumbar spinal stenosis (12/31/2016 MRI), Spinal cord stimulator status ([place]), L3-4 severe lumbar facet hypertrophy and spinal stenosis, and Chronic pain syndrome were also pertinent to this visit.  Plan of Care   Ms. SURAH PELLEY has a current medication list which includes the following long-term medication(s): albuterol, carvedilol, escitalopram, fluticasone, gabapentin, insulin pump, ipratropium-albuterol, pantoprazole, pramipexole, telmisartan, tramadol, trazodone, zolpidem, and metolazone.  Pharmacotherapy (Medications Ordered): Meds ordered this encounter  Medications   oxyCODONE-acetaminophen (PERCOCET) 10-325 MG tablet    Sig: Take 1 tablet by mouth every 12 (twelve) hours as needed for pain.    Dispense:  60 tablet    Refill:  0    Follow-up plan:   Return in about 1 month (around 09/08/2021) for Medication Management, in person.     s/p GNB (R)  07/18/19-helpful, provided greater than 50% pain relief for 5 days however patient did sustain a fall due to UTI associated sepsis did spend 3 days in the hospital.  Prior to her fall she was obtaining significant pain relief with genicular nerve block.  Status post right genicular RFA on 08/29/2019.  Left GN RFA on 02/12/19.            Recent Visits No visits were found meeting these conditions. Showing recent visits within past 90 days and meeting all other requirements Today's Visits Date Type Provider Dept  08/06/21 Telemedicine [name], MD [facility]  Showing today's visits and meeting all other requirements Future Appointments No visits were found meeting these conditions. Showing future appointments within next 90 days and meeting all other requirements I discussed the assessment and treatment plan with the patient. The patient was provided an opportunity to ask questions and all were answered. The patient agreed with the plan and demonstrated an understanding of the instructions.  Patient advised to call back or seek an in-person evaluation if the symptoms or condition worsens.  Duration of encounter: 61mnutes.  Note by: BGillis Santa MD Date: 08/06/2021; Time: 2:03 PM

## 2021-08-24 ENCOUNTER — Other Ambulatory Visit: Payer: Self-pay | Admitting: Nephrology

## 2021-08-24 DIAGNOSIS — N1831 Chronic kidney disease, stage 3a: Secondary | ICD-10-CM

## 2021-08-25 ENCOUNTER — Other Ambulatory Visit: Payer: Self-pay | Admitting: Student in an Organized Health Care Education/Training Program

## 2021-08-25 ENCOUNTER — Telehealth: Payer: Self-pay | Admitting: Student in an Organized Health Care Education/Training Program

## 2021-08-25 MED ORDER — OXYCODONE-ACETAMINOPHEN 10-325 MG PO TABS: 1.0000 | ORAL_TABLET | Freq: Two times a day (BID) | ORAL | 0 refills | Status: DC | PRN

## 2021-08-25 NOTE — Telephone Encounter (Signed)
Called patient and she states she never picked up script from 08/11. I called pharmacy and they have no record of the 08/11 script. Last refill on file was filled 06/20. No active opiod scripts on file. Bubble sent to Dr. Holley Raring to advise.

## 2021-08-25 NOTE — Telephone Encounter (Signed)
Script resent per Dr. Holley Raring. Patient called and notified.

## 2021-08-27 ENCOUNTER — Encounter: Payer: Medicare Other | Attending: Physician Assistant | Admitting: Physician Assistant

## 2021-08-27 ENCOUNTER — Telehealth: Payer: Self-pay | Admitting: Student in an Organized Health Care Education/Training Program

## 2021-08-27 ENCOUNTER — Other Ambulatory Visit: Payer: Self-pay

## 2021-08-27 DIAGNOSIS — E1122 Type 2 diabetes mellitus with diabetic chronic kidney disease: Secondary | ICD-10-CM | POA: Insufficient documentation

## 2021-08-27 DIAGNOSIS — J449 Chronic obstructive pulmonary disease, unspecified: Secondary | ICD-10-CM | POA: Insufficient documentation

## 2021-08-27 DIAGNOSIS — Z6841 Body Mass Index (BMI) 40.0 and over, adult: Secondary | ICD-10-CM | POA: Insufficient documentation

## 2021-08-27 DIAGNOSIS — E669 Obesity, unspecified: Secondary | ICD-10-CM | POA: Insufficient documentation

## 2021-08-27 DIAGNOSIS — E1140 Type 2 diabetes mellitus with diabetic neuropathy, unspecified: Secondary | ICD-10-CM | POA: Diagnosis not present

## 2021-08-27 DIAGNOSIS — E11622 Type 2 diabetes mellitus with other skin ulcer: Secondary | ICD-10-CM | POA: Insufficient documentation

## 2021-08-27 DIAGNOSIS — Z794 Long term (current) use of insulin: Secondary | ICD-10-CM | POA: Insufficient documentation

## 2021-08-27 DIAGNOSIS — X58XXXA Exposure to other specified factors, initial encounter: Secondary | ICD-10-CM | POA: Diagnosis not present

## 2021-08-27 DIAGNOSIS — S8011XA Contusion of right lower leg, initial encounter: Secondary | ICD-10-CM | POA: Insufficient documentation

## 2021-08-27 DIAGNOSIS — N182 Chronic kidney disease, stage 2 (mild): Secondary | ICD-10-CM | POA: Insufficient documentation

## 2021-08-27 DIAGNOSIS — I129 Hypertensive chronic kidney disease with stage 1 through stage 4 chronic kidney disease, or unspecified chronic kidney disease: Secondary | ICD-10-CM | POA: Insufficient documentation

## 2021-08-27 DIAGNOSIS — S8012XA Contusion of left lower leg, initial encounter: Secondary | ICD-10-CM | POA: Diagnosis not present

## 2021-08-27 DIAGNOSIS — Z993 Dependence on wheelchair: Secondary | ICD-10-CM | POA: Diagnosis not present

## 2021-08-27 NOTE — Progress Notes (Addendum)
[name], [name] ([id number]) Visit Report for 08/27/2021 Chief Complaint Document Details Patient Name: [name], [name] Date of Service: 08/27/2021 3:45 PM Medical Record Number: [id number] Patient Account Number: [id number] Date of Birth/Sex: 12/24/1951 (70 y.o. F) Treating RN: Epps, Carrie Primary Care Provider: Miller, Mark Other Clinician: Referring Provider: Miller, Mark Treating Provider/Extender: Stone, Hoyt Weeks in Treatment: 9 Information Obtained from: Patient Chief Complaint Right LE Hematoma following contusion with new left LE hematoma first noted on 07/30/21 Electronic Signature(s) Signed: 08/27/2021 4:08:00 PM By: Stone III, Hoyt PA-C Entered By: Stone III, Hoyt on 08/27/2021 16:08:00 [name], [name] ([id number]) -------------------------------------------------------------------------------- HPI Details Patient Name: [name], [name] Date of Service: 08/27/2021 3:45 PM Medical Record Number: [id number] Patient Account Number: [id number] Date of Birth/Sex: 03/11/1951 (70 y.o. F) Treating RN: Epps, Carrie Primary Care Provider: Miller, Mark Other Clinician: Referring Provider: Miller, Mark Treating Provider/Extender: Stone, Hoyt Weeks in Treatment: 9 History of Present Illness HPI Description: 06/22/2021 upon evaluation today patient appears to be doing somewhat poorly in regard to the wound that actually started around the beginning of May. She got a an electric wheelchair which she tells me was for the most part a blessing. With that being said she unfortunately had an issue where she was in the bathroom and subsequently had left it on when she stopped to do something at the counter. In the end she actually had some issues here with running into the cabinet. She tells me that she actually hit the cabinet a couple times and could not get the chair to back up and to stop therefore she had quite a bit of damage to her leg. Her primary care provider put her in a boot on  this trying to get it to reabsorb but unfortunately that is just not the case and not what we see happen. Fortunately there does not appear to be any signs of active infection at this time she is on doxycycline for a sinus infection completely separate from this issue but nonetheless that does seem to be a good antibiotic generally for skin infections and I think is at least something good for her currently. She does have some discomfort. Her daughter is present during the evaluation today. I did explain to the patient and her daughter that I do believe her get have to evacuate the hematoma this is already open and to be honest its not getting healed very well until we clear this out. 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 With that being said her hemoglobin A1c is stated to be around 7.5 which is actually good and should not impede her healing this is great news. Overall I am pleased with what I am hearing in that regard. With that being said I still think this is going to take quite a while for this patient to heal we do need however to clear away the necrotic and nonviable/healthy tissue. 06/30/2021 upon evaluation today patient appears to be doing well with regard to her wound although there is some signs of infection. We did have to perform a fairly significant debridement last week. Obviously this has been somewhat painful for her which I think is part of the issue but the other part I think is simply that she may have an infection here which is problematic as well. We definitely need to try to do something to improve this. I did obtain a wound   culture today we will see what that shows. 07/30/21 upon evaluation today patient presents for reevaluation here in the clinic since I last saw her she's actually been in the hospital due to having fractured her left ankle  region. Unfortunately she also has a significant hematoma of the left leg which has not been addressed either as far as evacuating hematoma currently. She does have a fairly tight ace wrap on the left foot and ankle region this does not go any higher however. With that being said I did discuss with the patient currently today as well as her daughter who was present that we do need to tried to clear away the necrotic tissue on the surface where the hematoma is and then subsequently evacuate as much of the hematoma as possible. With that being said there does not appear to be any evidence of infection she is on antibiotic currently. That is Bactrim DS. That's actually a good one for the time being. She's been using Dakin solution on the right anterior shin where she had a previous hematoma. Now it looks like that's probably to be the best option for the left as well. I have not actually seen her since July 5 and the reason is because she did have the fault, fractured ankle, and was in the hospital until just recently. 08/06/2021 upon evaluation today patient appears to be doing actually decently well in regard to the wound on her right leg there is a lot of healing happening and I am very pleased in that regard. In regard to her left leg this actually is also dramatically improved compared to last week. The biggest issue she is having with her ankle at this point is that she did have a popping sensation she is not exactly sure what happened here but to be honest its been hurting her quite severely since yesterday she was not even able to get into the office at the orthopedic clinic to get this checked out to try to work on getting a mobile x-ray out. With that being said she is still having a tremendous amount of pain in the wound also on the left leg hurts as well. They have been doing the Dakin's moistened gauze dressing in an effective way and I do feel like that it is doing great at this point. There  is some slough and biofilm that could require sharp debridement but to be honest she is having so much pain and so much going on that I would get and defer that at this point. 08/27/2021 upon evaluation today patient appears to be doing well with regard to her wound. She has been tolerating the dressing changes without complication. Fortunately there does not appear to be any signs of active infection which is great news and overall very pleased with where things stand. Both wounds are looking better the [facility] is being used on the right the [place] solution on the left. Electronic Signature(s) Signed: 08/27/2021 4:59:36 PM By: Worthy Keeler PA-C Entered By: Worthy Keeler on 08/27/2021 16:59:36 Brittney [name] ([id number]) -------------------------------------------------------------------------------- Physical Exam Details Patient Name: EVETTA, RENNER B. Date of Service: 08/27/2021 3:45 PM Medical Record Number: [id number] Patient Account Number: [id number] Date of Birth/Sex: [birth year] (70 y.o. F) Treating RN: Carlene Coria Primary Care Provider: Emily Filbert Other Clinician: Referring Provider: Emily Filbert Treating Provider/Extender: Skipper Cliche in Treatment: 9 Constitutional Obese and well-hydrated in no acute distress. Respiratory normal breathing without difficulty. Psychiatric this patient is able to make  decisions and demonstrates good insight into disease process. Alert and Oriented x 3. pleasant and cooperative. Notes Upon inspection patient's wounds again are both showing signs of improvement and measuring a little bit smaller. I do not see any evidence right now of active infection which is great and overall I am extremely pleased in that regard. I did mention the possibility of a wound VAC for the left leg she really does not want to do that unless she absolutely has to. Electronic Signature(s) Signed: 08/27/2021 5:00:00 PM By: Stone III, Hoyt PA-C Entered By:  Stone III, Hoyt on 08/27/2021 17:00:00 [name], [name] ([id number]) -------------------------------------------------------------------------------- Physician Orders Details Patient Name: Artist, Daijanae B. Date of Service: 08/27/2021 3:45 PM Medical Record Number: [id number] Patient Account Number: [id number] Date of Birth/Sex: 08/18/1951 (70 y.o. 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 Home Health o Home Health Company: - [facility] o CONTINUE Home Health for wound care. May utilize formulary equivalent dressing for wound treatment orders unless otherwise specified. Home Health Nurse may visit PRN to address patientos wound care needs. o Scheduled days for dressing changes to be completed; exception, patient has scheduled wound care visit that day. Bathing/ Shower/ Hygiene o May shower with wound dressing protected with water repellent cover or cast protector. o No tub bath. Additional Orders / Instructions o Follow Nutritious Diet and Increase Protein Intake Wound Treatment Wound #1 - Lower Leg Wound Laterality: Right Cleanser: Soap and Water 2 x Per Week/30 Days Discharge Instructions: Gently cleanse wound with  antibacterial soap, rinse and pat dry prior to dressing wounds Primary Dressing: Hydrofera Blue Ready Transfer Foam, 2.5x2.5 (in/in) (DME) (Generic) 2 x Per Week/30 Days Discharge Instructions: Apply Hydrofera Blue Ready to wound bed as directed Secondary Dressing: Xtrasorb Medium 4x5 (in/in) (DME) (Generic) 2 x Per Week/30 Days Discharge Instructions: Apply to wound as directed. Do not cut. Compression Wrap: Profore Lite LF 3 Multilayer Compression Bandaging System (DME) (Generic) 2 x Per Week/30 Days Discharge Instructions: Apply 3 multi-layer wrap as prescribed. Wound #2 - Lower Leg Wound Laterality: Left Cleanser: Byram Ancillary Kit - 15 Day Supply (DME) (Generic) 1 x Per Day/30 Days Discharge Instructions: Use supplies as instructed; Kit contains: (15) Saline Bullets; (15) 3x3 Gauze; 15 pr Gloves Primary Dressing: ABD Pad, 5x9 (in/in) (DME) (Generic) 1 x Per Day/30 Days Secondary Dressing: Kerlix 4.5 x 4.1 (in/yd) (DME) (Generic) 1 x Per Day/30 Days Discharge Instructions: Apply Kerlix 4.5 x 4.1 (in/yd) as instructed Secured With: 3M Medipore H Soft Cloth Surgical Tape, 2x2 (in/yd) (DME) (Generic) 1 x Per Day/30 Days Discharge Instructions: Secure dressing [name], [name] ([id number]) Secured With: Tubigrip Size F, 4x10 (in/yd) 1 x Per Day/30 Days Discharge Instructions: Apply Tubigrip F 3 finger-widths below knee to base of toes to secure dressing and/or for swelling. Electronic Signature(s) Signed: 08/27/2021 5:15:45 PM By: Stone III, Hoyt PA-C Signed: 08/28/2021 7:59:11   PM By: Epps, Carrie RN Entered By: Epps, Carrie on 08/27/2021 16:40:44 [name], [name] ([id number]) -------------------------------------------------------------------------------- Problem List Details Patient Name: [name], [name] Date of Service: 08/27/2021 3:45 PM Medical Record Number: [id number] Patient Account Number: [id number] Date of Birth/Sex: 01/22/1951 (70 y.o. 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 ([id number]) -------------------------------------------------------------------------------- Progress Note Details Patient Name: [name], [name] Date of Service: 08/27/2021 3:45 PM Medical Record Number: [id number] Patient Account Number: [id number] Date of Birth/Sex: 12/30/1950 (70 y.o. F) Treating RN: Epps, Carrie Primary Care Provider: Miller, Mark Other Clinician: Referring Provider: Miller, Mark Treating Provider/Extender: Stone, Hoyt Weeks in Treatment: 9 Subjective Chief Complaint Information obtained from Patient Right LE Hematoma following contusion with new left LE hematoma first noted on 07/30/21 History of Present Illness (HPI) 06/22/2021 upon evaluation today patient appears to be doing somewhat poorly in regard to the wound that actually started around the beginning of May. She got a an electric wheelchair which she tells me was for the most part a blessing. With  that being said she unfortunately had an issue where she was in the bathroom and subsequently had left it on when she stopped to do something at the counter. In the end she actually had some issues here with running into the cabinet. She tells me that she actually hit the cabinet a couple times and could not get the chair to back up and to stop therefore she had quite a bit of damage to her leg. Her primary care provider put her in a boot on this trying to get it to reabsorb but unfortunately that is just not the case and not what we see happen. Fortunately there does not appear to be any signs of active infection at this time she is on doxycycline for a sinus infection completely separate from this issue but nonetheless that does seem to be a good antibiotic generally for skin infections and I think is at least something good for her currently. She does have some discomfort. Her daughter is present during the evaluation today. I did explain to the patient and her daughter that I do believe her get have to evacuate the hematoma this is already open and to be honest its not getting healed very well until we clear this out. 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 With that being said her hemoglobin A1c is stated to be around 7.5 which is actually good and should not impede her healing this is great news. Overall I am pleased with what I am hearing in   that regard. With that being said I still think this is going to take quite a while for this patient to heal we do need however to clear away the necrotic and nonviable/healthy tissue. 06/30/2021 upon evaluation today patient appears to be doing well with regard to her wound although there is some signs of infection. We did have to perform a fairly significant debridement last week. Obviously this has been somewhat painful for her  which I think is part of the issue but the other part I think is simply that she may have an infection here which is problematic as well. We definitely need to try to do something to improve this. I did obtain a wound culture today we will see what that shows. 07/30/21 upon evaluation today patient presents for reevaluation here in the clinic since I last saw her she's actually been in the hospital due to having fractured her left ankle region. Unfortunately she also has a significant hematoma of the left leg which has not been addressed either as far as evacuating hematoma currently. She does have a fairly tight ace wrap on the left foot and ankle region this does not go any higher however. With that being said I did discuss with the patient currently today as well as her daughter who was present that we do need to tried to clear away the necrotic tissue on the surface where the hematoma is and then subsequently evacuate as much of the hematoma as possible. With that being said there does not appear to be any evidence of infection she is on antibiotic currently. That is Bactrim DS. That's actually a good one for the time being. She's been using Dakin solution on the right anterior shin where she had a previous hematoma. Now it looks like that's probably to be the best option for the left as well. I have not actually seen her since July 5 and the reason is because she did have the fault, fractured ankle, and was in the hospital until just recently. 08/06/2021 upon evaluation today patient appears to be doing actually decently well in regard to the wound on her right leg there is a lot of healing happening and I am very pleased in that regard. In regard to her left leg this actually is also dramatically improved compared to last week. The biggest issue she is having with her ankle at this point is that she did have a popping sensation she is not exactly sure what happened here but to be honest its been  hurting her quite severely since yesterday she was not even able to get into the office at the orthopedic clinic to get this checked out to try to work on getting a mobile x-ray out. With that being said she is still having a tremendous amount of pain in the wound also on the left leg hurts as well. They have been doing the Dakin's moistened gauze dressing in an effective way and I do feel like that it is doing great at this point. There is some slough and biofilm that could require sharp debridement but to be honest she is having so much pain and so much going on that I would get and defer that at this point. 08/27/2021 upon evaluation today patient appears to be doing well with regard to her wound. She has been tolerating the dressing changes without complication. Fortunately there does not appear to be any signs of active infection which is great news and overall very pleased with   where things stand. Both wounds are looking better the [facility] is being used on the right the [place] solution on the left. Objective Constitutional Obese and well-hydrated in no acute distress. Vitals Time Taken: 3:12 PM, Height: 66 in, Weight: 329 lbs, BMI: 53.1, Temperature: 97.9 F, Pulse: 65 bpm, Respiratory Rate: 18 breaths/min, [name], [name] ([id number]) Blood Pressure: 173/66 mmHg, Capillary Blood Glucose: 107 mg/dl. General Notes: CBBG per patient Respiratory normal breathing without difficulty. Psychiatric this patient is able to make decisions and demonstrates good insight into disease process. Alert and Oriented x 3. pleasant and cooperative. General Notes: Upon inspection patient's wounds again are both showing signs of improvement and measuring a little bit smaller. I do not see any evidence right now of active infection which is great and overall I am extremely pleased in that regard. I did mention the possibility of a wound VAC for the left leg she really does not want to do that unless she  absolutely has to. Integumentary (Hair, Skin) Wound #1 status is Open. Original cause of wound was Hematoma. The date acquired was: 04/26/2021. The wound has been in treatment 9 weeks. The wound is located on the Right Lower Leg. The wound measures 5.5cm length x 4cm width x 0.3cm depth; 17.279cm^2 area and 5.184cm^3 volume. There is Fat Layer (Subcutaneous Tissue) exposed. There is no tunneling or undermining noted. There is a medium amount of serosanguineous drainage noted. There is large (67-100%) red granulation within the wound bed. There is a small (1-33%) amount of necrotic tissue within the wound bed including Adherent Slough. Wound #2 status is Open. Original cause of wound was Hematoma. The date acquired was: 07/06/2021. The wound has been in treatment 4 weeks. The wound is located on the Left Lower Leg. The wound measures 7.5cm length x 5.5cm width x 1.8cm depth; 32.398cm^2 area and 58.316cm^3 volume. There is Fat Layer (Subcutaneous Tissue) exposed. There is no tunneling or undermining noted. There is a large amount of sanguinous drainage noted. There is medium (34-66%) red, pink granulation within the wound bed. There is a medium (34-66%) amount of necrotic tissue within the wound bed including Adherent Slough. 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 There was a Three Layer Compression Therapy Procedure by Carlene Coria, RN. Post procedure Diagnosis Wound  #1: Same as Pre-Procedure Plan Follow-up Appointments: Return Appointment in 3 weeks. Home Health: [facility] for wound care. May utilize formulary equivalent dressing for wound treatment orders unless otherwise specified. Home Health Nurse may visit PRN to address patient s wound care needs. Scheduled days for dressing changes to be completed; exception, patient has scheduled wound care visit that day. Bathing/ Shower/ Hygiene: May shower with wound dressing protected with water repellent cover or cast protector. No tub bath. Additional Orders / Instructions: JAHAYRA, MAZO ([id number]) Follow Nutritious Diet and Increase Protein Intake WOUND #1: - Lower Leg Wound Laterality: Right Cleanser: Soap and Water 2 x Per Week/30 Days Discharge Instructions: Gently cleanse wound with antibacterial soap,  rinse and pat dry prior to dressing wounds Primary Dressing: Hydrofera Blue Ready Transfer Foam, 2.5x2.5 (in/in) (DME) (Generic) 2 x Per Week/30 Days Discharge Instructions: Apply Hydrofera Blue Ready to wound bed as directed Secondary Dressing: Xtrasorb Medium 4x5 (in/in) (DME) (Generic) 2 x Per Week/30 Days Discharge Instructions: Apply to wound as directed. Do not cut. Compression Wrap: Profore Lite LF 3 Multilayer Compression Bandaging System (DME) (Generic) 2 x Per Week/30 Days Discharge Instructions: Apply 3 multi-layer wrap as prescribed. WOUND #2: - Lower Leg Wound Laterality: Left Cleanser: Byram Ancillary Kit - 15 Day Supply (DME) (Generic) 1 x Per Day/30 Days Discharge Instructions: Use supplies as instructed; Kit contains: (15) Saline Bullets; (15) 3x3 Gauze; 15 pr Gloves Primary Dressing: ABD Pad, 5x9 (in/in) (DME) (Generic) 1 x Per Day/30 Days Secondary Dressing: Kerlix 4.5 x 4.1 (in/yd) (DME) (Generic) 1 x Per Day/30 Days Discharge Instructions: Apply Kerlix 4.5 x 4.1 (in/yd) as instructed Secured With: 26M Medipore H Soft Cloth  Surgical Tape, 2x2 (in/yd) (DME) (Generic) 1 x Per Day/30 Days Discharge Instructions: Secure dressing Secured With: Tubigrip Size F, 4x10 (in/yd) 1 x Per Day/30 Days Discharge Instructions: Apply Tubigrip F 3 finger-widths below knee to base of toes to secure dressing and/or for swelling. 1. Would recommend currently that we continue with [facility] for the right leg and the patient is in agreement with that plan. 2. I am also can recommend that we use a 3 layer compression wrap on the right leg instead of the [place] boot as I think the Unna boot is getting hard and causing pain and I think this would be more comfortable for her. 3. I am also can recommend that we have the patient continue with the Dakin's moistened gauze dressing to the left leg which I think is doing a good job keeping this clean and they are changing this daily. We will see patient back for reevaluation in 1 week here in the clinic. If anything worsens or changes patient will contact our office for additional recommendations. Electronic Signature(s) Signed: 08/27/2021 5:00:41 PM By: Worthy Keeler PA-C Entered By: Worthy Keeler on 08/27/2021 17:00:41 Brittney [name] ([id number]) -------------------------------------------------------------------------------- SuperBill Details Patient Name: LAKISA, LOTZ B. Date of Service: 08/27/2021 Medical Record Number: [id number] Patient Account Number: [id number] Date of Birth/Sex: June 10, 1951 (70 y.o. 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, stage 2 (mild) J44.9 Chronic obstructive pulmonary disease, unspecified E11.622 Type 2 diabetes mellitus with other skin ulcer Facility Procedures CPT4 Code: 57017793 Description: (Facility Use Only) [phone] - APPLY MULTLAY COMPRS LWR RT LEG Modifier: Quantity: 1 Physician Procedures CPT4 Code: 3300762 Description: 99214 - WC PHYS LEVEL 4 - EST PT Modifier: Quantity: 1 CPT4 Code: Description: ICD-10 Diagnosis Description S80.11XA Contusion of right lower leg, initial encounter 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

## 2021-08-27 NOTE — Progress Notes (Addendum)
COSETTE, PRINDLE ([id number]) Visit Report for 08/27/2021 Arrival Information Details Patient Name: Brittney [name], Brittney [name]. Date of Service: 08/27/2021 3:45 PM Medical Record Number: [id number] Patient Account Number: [id number] Date of Birth/Sex: [birth year] (70 y.o. F) Treating RN: Carlene Coria Primary Care Havyn Ramo: Emily Filbert Other Clinician: Referring Darnette Lampron: Emily Filbert Treating Shashana Fullington/Extender: Skipper Cliche in Treatment: 9 Visit Information History Since Last Visit All ordered tests and consults were completed: No Patient Arrived: Wheel Chair Added or deleted any medications: No Arrival Time: 16:03 Any new allergies or adverse reactions: No Accompanied By: daughter Had a fall or experienced change in No Transfer Assistance: None activities of daily living that may affect Patient Identification Verified: Yes risk of falls: Secondary Verification Process Completed: Yes Signs or symptoms of abuse/neglect since last visito No Patient Requires Transmission-Based Precautions: No Hospitalized since last visit: No Patient Has Alerts: Yes Implantable device outside of the clinic excluding No Patient Alerts: Type II Diabetic cellular tissue based products placed in the center since last visit: Has Dressing in Place as Prescribed: Yes Has Compression in Place as Prescribed: Yes Pain Present Now: No Electronic Signature(s) Signed: 08/28/2021 7:59:11 PM By: Carlene Coria RN Entered By: Carlene Coria on 08/27/2021 16:12:37 [name] ([id number]) -------------------------------------------------------------------------------- Clinic Level of Care Assessment Details Patient Name: Christy Gentles B. Date of Service: 08/27/2021 3:45 PM Medical Record Number: [id number] Patient Account Number: [id number] Date of Birth/Sex: [birth year] (70 y.o. F) Treating RN: Carlene Coria Primary Care Swade Shonka: Emily Filbert Other Clinician: Referring Simar Pothier: Emily Filbert Treating  Japji Kok/Extender: Skipper Cliche in Treatment: 9 Clinic Level of Care Assessment Items TOOL 1 Quantity Score _0  - Use when EandM and Procedure is performed on INITIAL visit 0 ASSESSMENTS - Nursing Assessment / Reassessment _1  - General Physical Exam (combine w/ comprehensive assessment (listed just below) when performed on new 0 pt. evals) _2  - 0 Comprehensive Assessment (HX, ROS, Risk Assessments, Wounds Hx, etc.) ASSESSMENTS - Wound and Skin Assessment / Reassessment _3  - Dermatologic / Skin Assessment (not related to wound area) 0 ASSESSMENTS - Ostomy and/or Continence Assessment and Care _4  - Incontinence Assessment and Management 0 _5  - 0 Ostomy Care Assessment and Management (repouching, etc.) PROCESS - Coordination of Care _6  - Simple Patient / Family Education for ongoing care 0 _7  - 0 Complex (extensive) Patient / Family Education for ongoing care _8  - 0 Staff obtains Programmer, systems, Records, Test Results / Process Orders _9  - 0 Staff telephones HHA, Nursing Homes / Clarify orders / etc _10  - 0 Routine Transfer to another Facility (non-emergent condition) _11  - 0 Routine Hospital Admission (non-emergent condition) _12  - 0 New Admissions / Biomedical engineer / Ordering NPWT, Apligraf, etc. _13  - 0 Emergency Hospital Admission (emergent condition) PROCESS - Special Needs _14  - Pediatric / Minor Patient Management 0 _15  - 0 Isolation Patient Management _16  - 0 Hearing / Language / Visual special needs _17  - 0 Assessment of Community assistance (transportation, D/C planning, etc.) _18  - 0 Additional assistance / Altered mentation _19  - 0 Support Surface(s) Assessment (bed, cushion, seat, etc.) INTERVENTIONS - Miscellaneous _20  - External ear exam 0 _21  - 0 Patient Transfer (multiple staff / Civil Service fast streamer / Similar devices) _22  - 0 Simple Staple / Suture removal (25 or less) _23  - 0 Complex Staple / Suture removal (26 or more) _24  - 0 Hypo/Hyperglycemic Management (do not  check if billed separately) _25  - 0 Ankle / Brachial Index (ABI) - do not check if billed separately Has the patient been seen  at the hospital within the last three years: Yes Total Score: 0 Level Of Care: ____ [name] ([id number]) Electronic Signature(s) Signed: 08/28/2021 7:59:11 PM By: Carlene Coria RN Entered By: Carlene Coria on 08/27/2021 16:37:34 [name] ([id number]) -------------------------------------------------------------------------------- Compression Therapy Details Patient Name: LENNY, BOUCHILLON B. Date of Service: 08/27/2021 3:45 PM Medical Record Number: [id number] Patient Account Number: [id number] Date of Birth/Sex: 02/15/1951 (70 y.o. F) Treating RN: Carlene Coria Primary Care Trenton Verne: Emily Filbert Other Clinician: Referring Skyleen Bentley: Emily Filbert Treating Loi Rennaker/Extender: Skipper Cliche in Treatment: 9 Compression Therapy Performed for Wound Assessment: Wound #1 Right Lower Leg Performed By: Clinician Carlene Coria, RN Compression Type: Three Layer Post Procedure Diagnosis Same as Pre-procedure Electronic Signature(s) Signed: 08/28/2021 7:59:11 PM By: Carlene Coria RN Entered By: Carlene Coria on 08/27/2021 16:36:10 [name] ([id number]) -------------------------------------------------------------------------------- Encounter Discharge Information Details Patient Name: ARYANAH, ENSLOW B. Date of Service: 08/27/2021 3:45 PM Medical Record Number: [id number] Patient Account Number: [id number] Date of Birth/Sex: 09/28/1951 (70 y.o. F) Treating RN: Carlene Coria Primary Care Aeneas Longsworth: Emily Filbert Other Clinician: Referring Vince Ainsley: Emily Filbert Treating Daryus Sowash/Extender: Skipper Cliche in Treatment: 9 Encounter Discharge Information Items Discharge Condition: Stable Ambulatory Status: Wheelchair Discharge Destination: Home Transportation: Private Auto Accompanied By: daughter Schedule Follow-up Appointment: Yes Clinical Summary of  Care: Patient Declined Electronic Signature(s) Signed: 08/28/2021 7:59:11 PM By: Carlene Coria RN Entered By: Carlene Coria on 08/27/2021 16:38:54 [name] ([id number]) -------------------------------------------------------------------------------- Lower Extremity Assessment Details Patient Name: JAYLAA, GALLION B. Date of Service: 08/27/2021 3:45 PM Medical Record Number: [id number] Patient Account Number: [id number] Date of Birth/Sex: 1951-09-25 (70 y.o. F) Treating RN: Carlene Coria Primary Care Audianna Landgren: Emily Filbert Other Clinician: Referring Jary Louvier: Emily Filbert Treating Keyion Knack/Extender: Skipper Cliche in Treatment: 9 Electronic Signature(s) Signed: 08/28/2021 7:59:11 PM By: Carlene Coria RN Entered By: Carlene Coria on 08/27/2021 16:26:59 [name] ([id number]) -------------------------------------------------------------------------------- Multi Wound Chart Details Patient Name: PAYSLIE, MCCAIG B. Date of Service: 08/27/2021 3:45 PM Medical Record Number: [id number] Patient Account Number: [id number] Date of Birth/Sex: 07/04/1951 (70 y.o. F) Treating RN: Carlene Coria Primary Care Jewelene Mairena: Emily Filbert Other Clinician: Referring Bryen Hinderman: Emily Filbert Treating Merdith Boyd/Extender: Skipper Cliche in Treatment: 9 Vital Signs Height(in): 66 Capillary Blood Glucose 107 (mg/dl): Weight(lbs): 329 Pulse(bpm): 81 Body Mass Index(BMI): 64 Blood Pressure(mmHg): 173/66 Temperature(F): 97.9 Respiratory Rate(breaths/min): 18 Photos: [N/A:N/A] Wound Location: Right Lower Leg Left Lower Leg N/A Wounding Event: Hematoma Hematoma N/A Primary Etiology: Trauma, Other Trauma, Other N/A Comorbid History: Anemia, Lymphedema, Chronic Anemia, Lymphedema, Chronic N/A Obstructive Pulmonary Disease Obstructive Pulmonary Disease (COPD), Sleep Apnea, (COPD), Sleep Apnea, Hypertension, Myocardial Infarction, Hypertension, Myocardial Infarction, Cirrhosis , Type II Diabetes,  Cirrhosis , Type II Diabetes, Osteoarthritis, Neuropathy Osteoarthritis, Neuropathy Date Acquired: 04/26/2021 07/06/2021 N/A Weeks of Treatment: 9 4 N/A Wound Status: Open Open N/A Clustered Wound: Yes No N/A Clustered Quantity: 2 N/A N/A Measurements L x W x D (cm) 5.5x4x0.3 7.5x5.5x1.8 N/A Area (cm) : 17.279 32.398 N/A Volume (cm) : 5.184 58.316 N/A % Reduction in Area: 45.00% -7.10% N/A % Reduction in Volume: 76.40% -141.10% N/A Classification: Full Thickness Without Exposed Full Thickness Without Exposed N/A Support Structures Support Structures Exudate Amount: Medium Large N/A Exudate Type: Serosanguineous Sanguinous N/A Exudate Color: red, brown red N/A Granulation Amount: Large (67-100%) Medium (34-66%) N/A Granulation Quality: Red Red, Pink N/A Necrotic Amount: Small (1-33%) Medium (34-66%) N/A Exposed Structures: Fat Layer (Subcutaneous Tissue): Fat Layer (Subcutaneous Tissue): N/A Yes Yes Fascia: No Fascia: No Tendon: No Tendon: No Muscle:  No Muscle: No Joint: No Joint: No Bone: No Bone: No Epithelialization: Medium (34-66%) None N/A Treatment Notes Electronic Signature(s) Signed: 08/28/2021 7:59:11 PM By: Carlene Coria RN Wynetta Emery, Shanita B. ([id number]) Entered By: Carlene Coria on 08/27/2021 16:33:36 [name] ([id number]) -------------------------------------------------------------------------------- [place] Details Patient Name: ALKA, FALWELL B. Date of Service: 08/27/2021 3:45 PM Medical Record Number: [id number] Patient Account Number: [id number] Date of Birth/Sex: 24-Jun-1951 (70 y.o. F) Treating RN: Carlene Coria Primary Care Herschel Fleagle: Emily Filbert Other Clinician: Referring Mercedees Convery: Emily Filbert Treating Maree Ainley/Extender: Skipper Cliche in Treatment: 9 Active Inactive Wound/Skin Impairment Nursing Diagnoses: Impaired tissue integrity Knowledge deficit related to smoking impact on wound healing Knowledge deficit related  to ulceration/compromised skin integrity Goals: Ulcer/skin breakdown will have a volume reduction of 30% by week 4 Date Initiated: 06/22/2021 Date Inactivated: 08/27/2021 Target Resolution Date: 07/20/2021 Goal Status: Unmet Unmet Reason: comorbities Ulcer/skin breakdown will have a volume reduction of 50% by week 8 Date Initiated: 06/22/2021 Date Inactivated: 08/27/2021 Target Resolution Date: 08/17/2021 Goal Status: Unmet Unmet Reason: comorbities Ulcer/skin breakdown will have a volume reduction of 80% by week 12 Date Initiated: 06/22/2021 Target Resolution Date: 09/14/2021 Goal Status: Active Interventions: Assess patient/caregiver ability to obtain necessary supplies Assess patient/caregiver ability to perform ulcer/skin care regimen upon admission and as needed Assess ulceration(s) every visit Notes: Electronic Signature(s) Signed: 08/28/2021 7:59:11 PM By: Carlene Coria RN Entered By: Carlene Coria on 08/27/2021 16:33:27 [name] ([id number]) -------------------------------------------------------------------------------- Pain Assessment Details Patient Name: Christy Gentles B. Date of Service: 08/27/2021 3:45 PM Medical Record Number: [id number] Patient Account Number: [id number] Date of Birth/Sex: 04/26/1951 (70 y.o. F) Treating RN: Carlene Coria Primary Care Kynzli Rease: Emily Filbert Other Clinician: Referring Cherly Erno: Emily Filbert Treating Auston Halfmann/Extender: Skipper Cliche in Treatment: 9 Active Problems Location of Pain Severity and Description of Pain Patient Has Paino No Site Locations Pain Management and Medication Current Pain Management: Electronic Signature(s) Signed: 08/28/2021 7:59:11 PM By: Carlene Coria RN Entered By: Carlene Coria on 08/27/2021 [place], [place] ([id number]) -------------------------------------------------------------------------------- Patient/Caregiver Education Details Patient Name: RIANNAH, STAGNER B. Date of Service: 08/27/2021  3:45 PM Medical Record Number: [id number] Patient Account Number: [id number] Date of Birth/Gender: 1951-10-10 (70 y.o. F) Treating RN: Carlene Coria Primary Care Physician: Emily Filbert Other Clinician: Referring Physician: Emily Filbert Treating Physician/Extender: Skipper Cliche in Treatment: 9 Education Assessment Education Provided To: Patient Education Topics Provided Wound/Skin Impairment: Methods: Explain/Verbal Responses: State content correctly Electronic Signature(s) Signed: 08/28/2021 7:59:11 PM By: Carlene Coria RN Entered By: Carlene Coria on 08/27/2021 16:37:54 [name] ([id number]) -------------------------------------------------------------------------------- Wound Assessment Details Patient Name: ALIVEA, GLADSON B. Date of Service: 08/27/2021 3:45 PM Medical Record Number: [id number] Patient Account Number: [id number] Date of Birth/Sex: 1951/02/28 (70 y.o. F) Treating RN: Carlene Coria Primary Care Ramon Brant: Emily Filbert Other Clinician: Referring Charlean Carneal: Emily Filbert Treating Kama Cammarano/Extender: Skipper Cliche in Treatment: 9 Wound Status Wound Number: 1 Primary Trauma, Other Etiology: Wound Location: Right Lower Leg Wound Open Wounding Event: Hematoma Status: Date Acquired: 04/26/2021 Comorbid Anemia, Lymphedema, Chronic Obstructive Pulmonary Weeks Of Treatment: 9 History: Disease (COPD), Sleep Apnea, Hypertension, Myocardial Clustered Wound: Yes Infarction, Cirrhosis , Type II Diabetes, Osteoarthritis, Neuropathy Photos Wound Measurements Length: (cm) 5.5 Width: (cm) 4 Depth: (cm) 0.3 Clustered Quantity: 2 Area: (cm) 17.279 Volume: (cm) 5.184 % Reduction in Area: 45% % Reduction in Volume: 76.4% Epithelialization: Medium (34-66%) Tunneling: No Undermining: No Wound Description Classification: Full Thickness Without Exposed Support Structures Exudate Amount: Medium Exudate Type: Serosanguineous Exudate Color: red, brown Foul  Odor  After Cleansing: No Slough/Fibrino No Wound Bed Granulation Amount: Large (67-100%) Exposed Structure Granulation Quality: Red Fascia Exposed: No Necrotic Amount: Small (1-33%) Fat Layer (Subcutaneous Tissue) Exposed: Yes Necrotic Quality: Adherent Slough Tendon Exposed: No Muscle Exposed: No Joint Exposed: No Bone Exposed: No Treatment Notes Wound #1 (Lower Leg) Wound Laterality: Right Cleanser Soap and Water Discharge Instruction: Gently cleanse wound with antibacterial soap, rinse and pat dry prior to dressing wounds SHAWNELL, DYKES B. ([id number]) Peri-Wound Care Topical Primary Dressing Hydrofera Blue Ready Transfer Foam, 2.5x2.5 (in/in) Discharge Instruction: Apply Hydrofera Blue Ready to wound bed as directed Secondary Dressing Xtrasorb Medium 4x5 (in/in) Discharge Instruction: Apply to wound as directed. Do not cut. Secured With Compression Wrap Profore Lite LF 3 Multilayer Compression Bandaging System Discharge Instruction: Apply 3 multi-layer wrap as prescribed. Compression Stockings Add-Ons Electronic Signature(s) Signed: 08/28/2021 7:59:11 PM By: Carlene Coria RN Entered By: Carlene Coria on 08/27/2021 [place], [place] ([id number]) -------------------------------------------------------------------------------- Wound Assessment Details Patient Name: HANNY, ELSBERRY B. Date of Service: 08/27/2021 3:45 PM Medical Record Number: [id number] Patient Account Number: [id number] Date of Birth/Sex: [birth year] (70 y.o. F) Treating RN: Carlene Coria Primary Care Josephene Marrone: Emily Filbert Other Clinician: Referring Reia Viernes: Emily Filbert Treating Yoona Ishii/Extender: Skipper Cliche in Treatment: 9 Wound Status Wound Number: 2 Primary Trauma, Other Etiology: Wound Location: Left Lower Leg Wound Open Wounding Event: Hematoma Status: Date Acquired: 07/06/2021 Comorbid Anemia, Lymphedema, Chronic Obstructive Pulmonary Weeks Of Treatment: 4 History: Disease (COPD),  Sleep Apnea, Hypertension, Myocardial Clustered Wound: No Infarction, Cirrhosis , Type II Diabetes, Osteoarthritis, Neuropathy Photos Wound Measurements Length: (cm) 7.5 Width: (cm) 5.5 Depth: (cm) 1.8 Area: (cm) 32.398 Volume: (cm) 58.316 % Reduction in Area: -7.1% % Reduction in Volume: -141.1% Epithelialization: None Tunneling: No Undermining: No Wound Description Classification: Full Thickness Without Exposed Support Structures Exudate Amount: Large Exudate Type: Sanguinous Exudate Color: red Foul Odor After Cleansing: No Slough/Fibrino No Wound Bed Granulation Amount: Medium (34-66%) Exposed Structure Granulation Quality: Red, Pink Fascia Exposed: No Necrotic Amount: Medium (34-66%) Fat Layer (Subcutaneous Tissue) Exposed: Yes Necrotic Quality: Adherent Slough Tendon Exposed: No Muscle Exposed: No Joint Exposed: No Bone Exposed: No Treatment Notes Wound #2 (Lower Leg) Wound Laterality: Left Cleanser [place] Ancillary Kit - 15 Day Supply Discharge Instruction: Use supplies as instructed; Kit contains: (15) Saline Bullets; (15) 3x3 Gauze; 15 pr Gloves Peri-Wound Care EMANI, TAUSSIG ([id number]) Topical Primary Dressing Secondary Dressing ABD Pad 5x9 (in/in) Discharge Instruction: Cover with ABD pad [place] Discharge Instruction: Moisten with Dakins and pack wound Kerlix 4.5 x 4.1 (in/yd) Discharge Instruction: Apply Kerlix 4.5 x 4.1 (in/yd) as instructed Secured With 5M Medipore H Soft Cloth Surgical Tape, 2x2 (in/yd) Discharge Instruction: Secure dressing Tubigrip Size F, 4x10 (in/yd) Discharge Instruction: Apply Tubigrip F 3 finger-widths below knee to base of toes to secure dressing and/or for swelling. Compression Wrap Compression Stockings Add-Ons Electronic Signature(s) Signed: 08/28/2021 7:59:11 PM By: Carlene Coria RN Entered By: Carlene Coria on 08/27/2021 16:26:44 [name]  ([id number]) -------------------------------------------------------------------------------- Vitals Details Patient Name: Christy Gentles B. Date of Service: 08/27/2021 3:45 PM Medical Record Number: [id number] Patient Account Number: [id number] Date of Birth/Sex: 05-20-1951 (69 y.o. F) Treating RN: Carlene Coria Primary Care Joselin Crandell: Emily Filbert Other Clinician: Referring Joylynn Defrancesco: Emily Filbert Treating Jaelah Hauth/Extender: Skipper Cliche in Treatment: 9 Vital Signs Time Taken: 15:12 Temperature (F): 97.9 Height (in): 66 Pulse (bpm): 65 Weight (lbs): 329 Respiratory Rate (breaths/min): 18 Body Mass Index (BMI): 53.1 Blood Pressure (mmHg): 173/66 Capillary Blood Glucose (  mg/dl): 107 Reference Range: 80 - 120 mg / dl Notes [facility] per patient Electronic Signature(s) Signed: 08/28/2021 7:59:11 PM By: Carlene Coria RN Entered By: Carlene Coria on 08/27/2021 16:13:03

## 2021-08-27 NOTE — Telephone Encounter (Signed)
Dr Holley Raring, I spoke with the pharmacy and the prescription was deleted automatically because there was an error with your DEA number.  The pharmacy system has been updated and now if any errors come through, the script is automatically deleted.  So, this patient should have had her scripts yesterday and is unable to get them.   I will send this in a bubble as well.

## 2021-08-28 MED ORDER — OXYCODONE-ACETAMINOPHEN 10-325 MG PO TABS: 1.0000 | ORAL_TABLET | Freq: Three times a day (TID) | ORAL | 0 refills | Status: AC | PRN

## 2021-08-28 NOTE — Addendum Note (Signed)
Addended by: Molli Barrows on: 08/28/2021 11:18 AM   Modules accepted: Orders

## 2021-09-01 ENCOUNTER — Telehealth: Payer: Self-pay

## 2021-09-01 NOTE — Telephone Encounter (Signed)
Yes it looks like wit was.  We asked him to do this after we sent you the message.

## 2021-09-03 ENCOUNTER — Encounter: Payer: Medicare Other | Admitting: Student in an Organized Health Care Education/Training Program

## 2021-09-08 ENCOUNTER — Other Ambulatory Visit: Payer: Self-pay

## 2021-09-08 ENCOUNTER — Ambulatory Visit
Admission: RE | Admit: 2021-09-08 | Discharge: 2021-09-08 | Disposition: A | Discharge status: Home / Self Care | Payer: Medicare Other | Source: Ambulatory Visit | Attending: Nephrology | Admitting: Nephrology

## 2021-09-08 DIAGNOSIS — N1831 Chronic kidney disease, stage 3a: Secondary | ICD-10-CM | POA: Insufficient documentation

## 2021-09-14 ENCOUNTER — Ambulatory Visit: Payer: Medicare Other

## 2021-09-14 ENCOUNTER — Other Ambulatory Visit: Payer: Medicare Other

## 2021-09-14 ENCOUNTER — Ambulatory Visit: Payer: Medicare Other | Admitting: Oncology

## 2021-09-17 ENCOUNTER — Encounter
Admission: RE | Disposition: A | Discharge status: Home / Self Care | Payer: Self-pay | Source: Home / Self Care | Attending: Ophthalmology

## 2021-09-17 ENCOUNTER — Ambulatory Visit
Admission: RE | Admit: 2021-09-17 | Discharge: 2021-09-17 | Disposition: A | Discharge status: Home / Self Care | Payer: Medicare Other | Attending: Ophthalmology | Admitting: Ophthalmology

## 2021-09-17 ENCOUNTER — Ambulatory Visit: Payer: Medicare Other | Admitting: Certified Registered Nurse Anesthetist

## 2021-09-17 ENCOUNTER — Encounter: Payer: Self-pay | Admitting: Ophthalmology

## 2021-09-17 ENCOUNTER — Other Ambulatory Visit: Payer: Self-pay

## 2021-09-17 DIAGNOSIS — Z881 Allergy status to other antibiotic agents status: Secondary | ICD-10-CM | POA: Insufficient documentation

## 2021-09-17 DIAGNOSIS — Z794 Long term (current) use of insulin: Secondary | ICD-10-CM | POA: Insufficient documentation

## 2021-09-17 DIAGNOSIS — Z91013 Allergy to seafood: Secondary | ICD-10-CM | POA: Insufficient documentation

## 2021-09-17 DIAGNOSIS — Z888 Allergy status to other drugs, medicaments and biological substances status: Secondary | ICD-10-CM | POA: Insufficient documentation

## 2021-09-17 DIAGNOSIS — H2511 Age-related nuclear cataract, right eye: Secondary | ICD-10-CM | POA: Insufficient documentation

## 2021-09-17 DIAGNOSIS — Z85820 Personal history of malignant melanoma of skin: Secondary | ICD-10-CM | POA: Insufficient documentation

## 2021-09-17 DIAGNOSIS — Z79899 Other long term (current) drug therapy: Secondary | ICD-10-CM | POA: Insufficient documentation

## 2021-09-17 DIAGNOSIS — Z79890 Hormone replacement therapy (postmenopausal): Secondary | ICD-10-CM | POA: Insufficient documentation

## 2021-09-17 DIAGNOSIS — Z7951 Long term (current) use of inhaled steroids: Secondary | ICD-10-CM | POA: Insufficient documentation

## 2021-09-17 DIAGNOSIS — Z885 Allergy status to narcotic agent status: Secondary | ICD-10-CM | POA: Diagnosis not present

## 2021-09-17 DIAGNOSIS — Z9641 Presence of insulin pump (external) (internal): Secondary | ICD-10-CM | POA: Insufficient documentation

## 2021-09-17 DIAGNOSIS — E1136 Type 2 diabetes mellitus with diabetic cataract: Secondary | ICD-10-CM | POA: Insufficient documentation

## 2021-09-17 HISTORY — PX: CATARACT EXTRACTION W/PHACO: SHX586

## 2021-09-17 LAB — GLUCOSE, CAPILLARY: Glucose-Capillary: 126 mg/dL — ABNORMAL HIGH (ref 70–99)

## 2021-09-17 SURGERY — PHACOEMULSIFICATION, CATARACT, WITH IOL INSERTION
Anesthesia: General | Site: Eye | Laterality: Right

## 2021-09-17 MED ORDER — TETRACAINE HCL 0.5 % OP SOLN
1.0000 [drp] | OPHTHALMIC | Status: DC | PRN
  Administered 2021-09-17: 1 [drp] via OPHTHALMIC

## 2021-09-17 MED ORDER — BRIMONIDINE TARTRATE-TIMOLOL 0.2-0.5 % OP SOLN
OPHTHALMIC | Status: DC | PRN
  Administered 2021-09-17: 1 [drp] via OPHTHALMIC

## 2021-09-17 MED ORDER — CYCLOPENTOLATE HCL 2 % OP SOLN
OPHTHALMIC | Status: AC
  Administered 2021-09-17: 1 [drp] via OPHTHALMIC
  Filled 2021-09-17: qty 2

## 2021-09-17 MED ORDER — MIDAZOLAM HCL 2 MG/2ML IJ SOLN
INTRAMUSCULAR | Status: DC | PRN
  Administered 2021-09-17 (×2): 1 mg via INTRAVENOUS

## 2021-09-17 MED ORDER — CEFUROXIME OPHTHALMIC INJECTION 1 MG/0.1 ML
INJECTION | OPHTHALMIC | Status: DC | PRN
  Administered 2021-09-17: 0.1 mL via INTRACAMERAL

## 2021-09-17 MED ORDER — SIGHTPATH DOSE#1 BSS IO SOLN
INTRAOCULAR | Status: DC | PRN
  Administered 2021-09-17: 1 mL

## 2021-09-17 MED ORDER — SODIUM CHLORIDE 0.9 % IV SOLN: INTRAVENOUS | Status: DC

## 2021-09-17 MED ORDER — CYCLOPENTOLATE HCL 2 % OP SOLN
1.0000 [drp] | OPHTHALMIC | Status: AC
  Administered 2021-09-17 (×2): 1 [drp] via OPHTHALMIC

## 2021-09-17 MED ORDER — PHENYLEPHRINE HCL 10 % OP SOLN
OPHTHALMIC | Status: AC
  Administered 2021-09-17: 1 [drp] via OPHTHALMIC
  Filled 2021-09-17: qty 5

## 2021-09-17 MED ORDER — TETRACAINE HCL 0.5 % OP SOLN
OPHTHALMIC | Status: AC
  Administered 2021-09-17: 1 [drp] via OPHTHALMIC
  Filled 2021-09-17: qty 4

## 2021-09-17 MED ORDER — ONDANSETRON HCL 4 MG/2ML IJ SOLN
INTRAMUSCULAR | Status: DC | PRN
  Administered 2021-09-17: 4 mg via INTRAVENOUS

## 2021-09-17 MED ORDER — FENTANYL CITRATE (PF) 100 MCG/2ML IJ SOLN
INTRAMUSCULAR | Status: AC
  Filled 2021-09-17: qty 2

## 2021-09-17 MED ORDER — FENTANYL CITRATE (PF) 100 MCG/2ML IJ SOLN
INTRAMUSCULAR | Status: DC | PRN
  Administered 2021-09-17 (×2): 50 ug via INTRAVENOUS

## 2021-09-17 MED ORDER — SIGHTPATH DOSE#1 NA HYALUR & NA CHOND-NA HYALUR IO KIT
PACK | INTRAOCULAR | Status: DC | PRN
  Administered 2021-09-17: 1 via OPHTHALMIC

## 2021-09-17 MED ORDER — SIGHTPATH DOSE#1 BSS IO SOLN
INTRAOCULAR | Status: DC | PRN
  Administered 2021-09-17: 73 mL via OPHTHALMIC

## 2021-09-17 MED ORDER — SODIUM CHLORIDE 0.9 % IV SOLN: INTRAVENOUS | Status: DC | PRN

## 2021-09-17 MED ORDER — MIDAZOLAM HCL 2 MG/2ML IJ SOLN
INTRAMUSCULAR | Status: AC
  Filled 2021-09-17: qty 2

## 2021-09-17 MED ORDER — PHENYLEPHRINE HCL 10 % OP SOLN
1.0000 [drp] | OPHTHALMIC | Status: AC
  Administered 2021-09-17 (×2): 1 [drp] via OPHTHALMIC

## 2021-09-17 MED ORDER — SIGHTPATH DOSE#1 BSS IO SOLN
INTRAOCULAR | Status: DC | PRN
  Administered 2021-09-17: 15 mL

## 2021-09-17 SURGICAL SUPPLY — 12 items
CATARACT SUITE SIGHTPATH (MISCELLANEOUS) IMPLANT
GLOVE SURG ENC MOIS LTX SZ8 (GLOVE) ×2 IMPLANT
GLOVE SURG ENC TEXT LTX SZ7.5 (GLOVE) ×2 IMPLANT
GOWN STRL REUS W/ TWL LRG LVL3 (GOWN DISPOSABLE) ×2 IMPLANT
GOWN STRL REUS W/TWL LRG LVL3 (GOWN DISPOSABLE) ×4
KIT POST-OP (PACKS) ×2 IMPLANT
LENS IOL ACRYSOF IQ 20.0 (Intraocular Lens) ×2 IMPLANT
NEEDLE FILTER BLUNT 18X 1/2SAF (NEEDLE) ×2
NEEDLE FILTER BLUNT 18X1 1/2 (NEEDLE) ×2 IMPLANT
SYR 3ML LL SCALE MARK (SYRINGE) ×4 IMPLANT
SYR TB 1ML LUER SLIP (SYRINGE) ×2 IMPLANT
WATER STERILE IRR 250ML POUR (IV SOLUTION) ×2 IMPLANT

## 2021-09-17 NOTE — Anesthesia Postprocedure Evaluation (Signed)
Anesthesia Post Note  Patient: Brittney [name]  Procedure(s) Performed: CATARACT EXTRACTION PHACO AND INTRAOCULAR LENS PLACEMENT (IOC) RIGHT DIABETIC (Right: Eye)  Patient location during evaluation: Phase II Anesthesia Type: MAC Level of consciousness: awake and alert, oriented, patient cooperative and responds to stimulation Pain management: pain level controlled Vital Signs Assessment: post-procedure vital signs reviewed and stable Respiratory status: spontaneous breathing Cardiovascular status: blood pressure returned to baseline Postop Assessment: no apparent nausea or vomiting Anesthetic complications: no   No notable events documented.   Last Vitals:  Vitals:   09/17/21 0730 09/17/21 0818  BP: (!) 119/49 (!) 123/57  Pulse: (!) 57 (!) 56  Resp: 19 14  Temp: 36.6 C (!) 36.2 C  SpO2: 95%     Last Pain:  Vitals:   09/17/21 0818  TempSrc: Temporal  PainSc: 0-No pain                 Gayland Curry

## 2021-09-17 NOTE — Transfer of Care (Signed)
Immediate Anesthesia Transfer of Care Note  Patient: Brittney [name]  Procedure(s) Performed: CATARACT EXTRACTION PHACO AND INTRAOCULAR LENS PLACEMENT (IOC) RIGHT DIABETIC (Right: Eye)  Patient Location: PACU and Short Stay  Anesthesia Type:MAC  Level of Consciousness: awake, alert  and oriented  Airway & Oxygen Therapy: Patient Spontanous Breathing  Post-op Assessment: Report given to RN and Post -op Vital signs reviewed and stable  Post vital signs: Reviewed and stable  Last Vitals:  Vitals Value Taken Time  BP 123/57 09/17/21 0818  Temp 36.2 C 09/17/21 0818  Pulse 56 09/17/21 0818  Resp 14 09/17/21 0818  SpO2      Last Pain:  Vitals:   09/17/21 0818  TempSrc: Temporal  PainSc: 0-No pain         Complications: No notable events documented.

## 2021-09-17 NOTE — H&P (Signed)
[place]   Primary Care Physician:  Rusty Aus, MD Ophthalmologist: Dr. Leandrew Koyanagi  Pre-Procedure History & Physical: HPI:  Brittney [name] is a 70 y.o. female here for ophthalmic surgery.   Past Medical History:  Diagnosis Date   Abdominal pain, left upper quadrant 12/11/2012   Anemia    Arthritis    Asthma    Broken leg 2014   fell twice and broke same leg. had a bone stimulator for first break.   Cancer ([place]) 2007   melanoma, left ear and back of left leg, removed from back   Chronic kidney disease, stage 2 (mild)    Chronic lower back pain    Collagen vascular disease (HCC)    Complication of anesthesia    slow to wake, coughing   Coronary artery disease    Diabetes mellitus without complication ([place])    Diabetic nephropathy associated with secondary diabetes mellitus ([place])    Flushing 12/11/2012   Gastroparesis    Gross hematuria 12/11/2012   Hepatic cirrhosis ([place])    History of kidney stones    Hyperlipidemia    Hypertension    Hypothyroidism    Hypothyroidism    IBS (irritable bowel syndrome)    Kidney stone 12/11/2012   Lower extremity edema    Morbid obesity with BMI of 40.0-44.9, adult (HCC)    Motion sickness    back seat of cars   MRSA (methicillin resistant staph aureus) culture positive 2016   Myocardial infarction ([place]) 09/2013   Nausea without vomiting 12/11/2012   Nephrolithiasis    Nephrolithiasis 04/26/2014   Nonproliferative retinopathy due to secondary diabetes (HCC)    Numbness and tingling of right leg    Peripheral vascular disease ([place])    Renal colic 95/63/8756   Renal insufficiency    Sciatica    Sciatica 12/11/2012   Skin cancer 08/2018   Sleep apnea    USES CPAP   Spinal cord stimulator status    battery in left buttock   Stroke ([place]) 07/2020   Unilateral small kidney without contralateral hypertrophy    Wears dentures    full lower   Wheelchair dependent    reports able to transfer with assist     Past Surgical History:  Procedure Laterality Date   ABDOMINAL HYSTERECTOMY     BACK SURGERY  04/1999   L-4-5 LAMINECTOMY. metal in lower back and neck   [place]     COLONOSCOPY  05/04/2001   COLONOSCOPY WITH PROPOFOL N/A 09/20/2016   Procedure: COLONOSCOPY WITH PROPOFOL;  Surgeon: Lollie Sails, MD;  Location: [facility] ENDOSCOPY;  Service: Endoscopy;  Laterality: N/A;   DILATION AND CURETTAGE OF UTERUS     ESOPHAGOGASTRODUODENOSCOPY  02/08/2014   ESOPHAGOGASTRODUODENOSCOPY (EGD) WITH PROPOFOL N/A 09/20/2016   Procedure: ESOPHAGOGASTRODUODENOSCOPY (EGD) WITH PROPOFOL;  Surgeon: Lollie Sails, MD;  Location: [facility] ENDOSCOPY;  Service: Endoscopy;  Laterality: N/A;   ESOPHAGOGASTRODUODENOSCOPY (EGD) WITH PROPOFOL N/A 05/23/2018   Procedure: ESOPHAGOGASTRODUODENOSCOPY (EGD) WITH PROPOFOL;  Surgeon: Lollie Sails, MD;  Location[facility] END[facility]COPY;  Service: Endoscopy;  Laterality: N/A;   ESOPHAGOGASTRODUODENOSCOPY (EGD) WITH PROPOFOL N/A 07/18/2020   Procedure: ESOPHAGOGASTRODUODENOSCOPY (EGD) WITH PROPOFOL;  Surgeon: Lesly Rubenstein, MD;  Location: [facility] ENDOSCOPY;  Service: Gastroenterology;  Laterality: N/A;   EYE SURGERY  1986   FOR MELANOMA   HERNIA REPAIR     JOINT REPLACEMENT Left 09/24/2013   TOTAL KNEE   MOHS SURGERY  11/2016  left side of nose   PULSE GENERATOR IMPLANT N/A 11/02/2017   Procedure: UNILATERAL PULSE GENERATOR IMPLANT;  Surgeon: Meade Maw, MD;  Location: [facility] ORS;  Service: Neurosurgery;  Laterality: N/A;   TONSILLECTOMY     WITH ADENOIDECTOMY    Prior to Admission medications   Medication Sig Start Date End Date Taking? Authorizing Provider  albuterol (VENTOLIN HFA) 108 (90 Base) MCG/ACT inhaler Inhale 2 puffs into the lungs 3 (three) times daily as needed for wheezing or shortness of breath.    Yes [provider]  ALPRAZolam Duanne Moron) 0.5 MG tablet Take 0.5 mg by mouth 3 (three) times daily.   Yes  [provider]  aspirin EC 81 MG tablet Take 81 mg by mouth daily. Swallow whole.   Yes [provider]  budesonide (PULMICORT) 0.5 MG/2ML nebulizer solution Take 2 mLs by nebulization daily as needed (Chronic bronchitis). 07/25/20  Yes [provider]  carvedilol (COREG) 25 MG tablet Take 50 mg by mouth 2 (two) times daily with a meal.   Yes [provider]  Cholecalciferol (VITAMIN D-3) 125 MCG (5000 UT) TABS Take 5,000 Units by mouth daily at 8 pm.   Yes [provider]  cloNIDine (CATAPRES - DOSED IN MG/24 HR) 0.3 mg/24hr patch Place 0.3 mg onto the skin once a week.    Yes [provider]  cyclobenzaprine (FLEXERIL) 10 MG tablet Take 20 mg by mouth at bedtime. 12/19/19  Yes [provider]  diclofenac Sodium (VOLTAREN) 1 % GEL Apply 2 g topically daily as needed (pain).   Yes [provider]  escitalopram (LEXAPRO) 10 MG tablet Take 10 mg by mouth daily.  12/13/12  Yes [provider]  fluticasone (FLONASE) 50 MCG/ACT nasal spray Place 2 sprays into both nostrils daily as needed for allergies.   Yes [provider]  furosemide (LASIX) 40 MG tablet Take 40 mg by mouth daily as needed for fluid or edema. 08/14/20  Yes [provider]  gabapentin (NEURONTIN) 600 MG tablet Take 1 tablet (600mg ) by mouth twice daily and take 3 tablets (1800mg ) by mouth at bedtime Patient taking differently: Take 2,400 mg by mouth at bedtime. 08/21/20  Yes Mercy Riding, MD  hydrALAZINE (APRESOLINE) 50 MG tablet Take 50 mg by mouth 2 (two) times daily. 08/01/21  Yes [provider]  HYSEPT 0.25 % SOLN Apply 1 application topically daily. 08/06/21  Yes [provider]  ibuprofen (ADVIL) 800 MG tablet Take 800 mg by mouth daily.   Yes [provider]  Insulin Human (INSULIN PUMP) SOLN Inject into the skin continuous. Humulin R 100   Yes [provider]  ipratropium-albuterol (DUONEB) 0.5-2.5  (3) MG/3ML SOLN Take 3 mLs by nebulization every 6 (six) hours as needed (bronchitis). 07/25/20  Yes [provider]  KLOR-CON M10 10 MEQ tablet Take 10 mEq by mouth daily. 06/09/21  Yes [provider]  levothyroxine (SYNTHROID) 200 MCG tablet Take 200 mcg by mouth daily before breakfast.   Yes [provider]  linaclotide (LINZESS) 145 MCG CAPS capsule Take 145 mcg by mouth daily as needed (Constipation).   Yes [provider]  magnesium oxide (MAG-OX) 400 MG tablet Take 400 mg by mouth daily.   Yes [provider]  ondansetron (ZOFRAN) 4 MG tablet Take 4 mg by mouth 2 (two) times daily. 05/14/19  Yes [provider]  oxyCODONE-acetaminophen (PERCOCET) 10-325 MG tablet Take 1 tablet by mouth every 8 (eight) hours as needed for  pain. Patient taking differently: Take 1 tablet by mouth every 12 (twelve) hours. 08/28/21 09/27/21 Yes Molli Barrows, MD  pantoprazole (PROTONIX) 40 MG tablet Take 40 mg by mouth 2 (two) times daily.    Yes [provider]  pramipexole (MIRAPEX) 0.5 MG tablet Take 1 mg by mouth at bedtime.   Yes [provider]  telmisartan (MICARDIS) 40 MG tablet Take 40 mg by mouth daily. 08/14/20  Yes [provider]  traMADol (ULTRAM) 50 MG tablet Take 100 mg by mouth at bedtime.   Yes [provider]  traZODone (DESYREL) 150 MG tablet Take 150 mg by mouth at bedtime.  12/11/12  Yes [provider]  vitamin C (ASCORBIC ACID) 500 MG tablet Take 500 mg by mouth daily.   Yes [provider]  zolpidem (AMBIEN) 10 MG tablet Take 0.5 tablets (5 mg total) by mouth at bedtime. Patient taking differently: Take 10 mg by mouth at bedtime as needed for sleep. 08/21/20  Yes Mercy Riding, MD  metolazone (ZAROXOLYN) 5 MG tablet Take 5 mg by mouth 2 (two) times a week. 01/24/20 07/07/21  [provider]    Allergies as of 07/17/2021 - Review Complete 07/17/2021  Allergen Reaction Noted    Shellfish allergy Nausea And Vomiting, Diarrhea, and Nausea Only 02/20/2016   Cephalexin Other (See Comments) 06/15/2021   Amlodipine Cough 04/12/2014   Codeine Nausea And Vomiting and Nausea Only 06/28/2014   Imdur [isosorbide dinitrate] Other (See Comments) and Cough    Lyrica [pregabalin] Swelling and Other (See Comments) 04/12/2014   Monosodium glutamate Other (See Comments) and Cough    Tape Other (See Comments) and Nausea And Vomiting 02/20/2016    Family History  Problem Relation Age of Onset   Heart disease Mother    Cancer Mother    Asthma Mother    Diabetes Father    Kidney disease Father    Hypertension Father    Breast cancer Neg Hx     Social History   Socioeconomic History   Marital status: Single    Spouse name: Not on file   Number of children: Not on file   Years of education: Not on file   Highest education level: Not on file  Occupational History   Not on file  Tobacco Use   Smoking status: Never   Smokeless tobacco: Never  Vaping Use   Vaping Use: Never used  Substance and Sexual Activity   Alcohol use: No   Drug use: Never   Sexual activity: Not Currently  Other Topics Concern   Not on file  Social History Narrative   Not on file   Social Determinants of Health   Financial Resource Strain: Not on file  Food Insecurity: Not on file  Transportation Needs: Not on file  Physical Activity: Not on file  Stress: Not on file  Social Connections: Not on file  Intimate Partner Violence: Not on file    Review of Systems: See HPI, otherwise negative ROS  Physical Exam: There were no vitals taken for this visit. General:   Alert,  pleasant and cooperative in NAD Head:  Normocephalic and atraumatic. Lungs:  Clear to auscultation.    Heart:  Regular rate and rhythm.   Impression/Plan: Brittney [name] is here for ophthalmic surgery.  Risks, benefits, limitations, and alternatives regarding ophthalmic surgery have been reviewed with the  patient.  Questions have been answered.  All parties agreeable.   Leandrew Koyanagi, MD  09/17/2021, 7:29 AM

## 2021-09-17 NOTE — Anesthesia Preprocedure Evaluation (Addendum)
Anesthesia Evaluation  Patient identified by MRN, date of birth, ID band Patient awake    Reviewed: Allergy & Precautions, NPO status , Patient's Chart, lab work & pertinent test results, reviewed documented beta blocker date and time   History of Anesthesia Complications Negative for: history of anesthetic complications  Airway Mallampati: II  TM Distance: >3 FB Neck ROM: Full    Dental  (+) Poor Dentition, Dental Advidsory Given, Edentulous Lower,    Pulmonary neg shortness of breath, asthma (mild intermittent, uses albuterol ~2x per week) , sleep apnea (not using CPAP) , COPD,  COPD inhaler and oxygen dependent, neg recent URI,    breath sounds clear to auscultation- rhonchi (-) wheezing      Cardiovascular Exercise Tolerance: Poor hypertension, Pt. on medications and Pt. on home beta blockers (-) angina+ CAD, + Past MI and + Peripheral Vascular Disease  (-) Cardiac Stents + dysrhythmias (-) Valvular Problems/Murmurs Rhythm:Regular Rate:Normal - Systolic murmurs and - Diastolic murmurs    Neuro/Psych neg Seizures  Neuromuscular disease CVA (2021), Residual Symptoms negative psych ROS   GI/Hepatic GERD  ,(+) Cirrhosis  (NAFLD)      , NAFLD   Endo/Other  diabetes, Insulin DependentHypothyroidism Morbid obesity  Renal/GU CRFRenal disease     Musculoskeletal  (+) Arthritis ,   Abdominal (+) + obese,   Peds  Hematology negative hematology ROS (+)   Anesthesia Other Findings Past Medical History: No date: Arthritis No date: Asthma No date: Cancer ([place])     Comment: melanoma No date: Collagen vascular disease ([place]) No date: Diabetes mellitus without complication ([place]) No date: Diabetic nephropathy associated with secondary* No date: Gastroparesis No date: Hepatic cirrhosis ([place]) No date: Hyperlipidemia No date: Hypertension No date: Morbid obesity with BMI of 40.0-44.9, adult (H* No date: Nephrolithiasis No  date: Nonproliferative retinopathy due to secondary * No date: Peripheral vascular disease (HCC) No date: Sciatica No date: Sleep apnea No date: Unilateral small kidney without contralateral *   Reproductive/Obstetrics                            Anesthesia Physical  Anesthesia Plan  ASA: IV  Anesthesia Plan: General   Post-op Pain Management:    Induction: Intravenous  PONV Risk Score and Plan: 3 and Midazolam  Airway Management Planned: Natural Airway and Nasal Cannula  Additional Equipment:   Intra-op Plan:   Post-operative Plan:   Informed Consent: I have reviewed the patients History and Physical, chart, labs and discussed the procedure including the risks, benefits and alternatives for the proposed anesthesia with the patient or authorized representative who has indicated his/her understanding and acceptance.     Dental advisory given  Plan Discussed with: CRNA and Anesthesiologist  Anesthesia Plan Comments:        Anesthesia Quick Evaluation

## 2021-09-17 NOTE — Op Note (Signed)
  LOCATION:  [facility]   PREOPERATIVE DIAGNOSIS:    Nuclear sclerotic cataract right eye. H25.11   POSTOPERATIVE DIAGNOSIS:  Nuclear sclerotic cataract right eye.     PROCEDURE:  Phacoemusification with posterior chamber intraocular lens placement of the right eye   ULTRASOUND TIME: Procedure(s) with comments: CATARACT EXTRACTION PHACO AND INTRAOCULAR LENS PLACEMENT (IOC) RIGHT DIABETIC (Right) - 5.64 0:58.9  LENS:   Implant Name Type Inv. Item Serial No. Manufacturer Lot No. LRB No. Used Action  LENS IOL ACRYSOF IQ 20.0 - [id number] Intraocular Lens LENS IOL ACRYSOF IQ 20.0 [id number] [employer]  Right 1 Implanted         SURGEON:  Wyonia Hough, MD   ANESTHESIA:  Topical with tetracaine drops and 2% Xylocaine jelly, augmented with 1% preservative-free intracameral lidocaine.    COMPLICATIONS:  None.   DESCRIPTION OF PROCEDURE:  The patient was identified in the holding room and transported to the operating room and placed in the supine position under the operating microscope.  The right eye was identified as the operative eye and it was prepped and draped in the usual sterile ophthalmic fashion.   A 1 millimeter clear-corneal paracentesis was made at the 12:00 position.  0.5 ml of preservative-free 1% lidocaine was injected into the anterior chamber. The anterior chamber was filled with Viscoat viscoelastic.  A 2.4 millimeter keratome was used to make a near-clear corneal incision at the 9:00 position.  A curvilinear capsulorrhexis was made with a cystotome and capsulorrhexis forceps.  Balanced salt solution was used to hydrodissect and hydrodelineate the nucleus.   Phacoemulsification was then used in stop and chop fashion to remove the lens nucleus and epinucleus.  The remaining cortex was then removed using the irrigation and aspiration handpiece. Provisc was then placed into the capsular bag to distend it for lens placement.  A lens was then injected into the  capsular bag.  The remaining viscoelastic was aspirated.   Wounds were hydrated with balanced salt solution.  The anterior chamber was inflated to a physiologic pressure with balanced salt solution.  No wound leaks were noted. Cefuroxime 0.1 ml of a 10mg /ml solution was injected into the anterior chamber for a dose of 1 mg of intracameral antibiotic at the completion of the case.   Timolol and Brimonidine drops were applied to the eye.  The patient was taken to the recovery room in stable condition without complications of anesthesia or surgery.   Minsa Weddington 09/17/2021, 8:14 AM

## 2021-10-01 ENCOUNTER — Other Ambulatory Visit: Payer: Self-pay

## 2021-10-01 ENCOUNTER — Ambulatory Visit
Payer: Medicare Other | Attending: Student in an Organized Health Care Education/Training Program | Admitting: Student in an Organized Health Care Education/Training Program

## 2021-10-01 ENCOUNTER — Encounter: Payer: Self-pay | Admitting: Student in an Organized Health Care Education/Training Program

## 2021-10-01 VITALS — BP 151/71 | HR 68 | Temp 97.3°F | Resp 16 | Ht 66.0 in | Wt 310.0 lb

## 2021-10-01 DIAGNOSIS — M47816 Spondylosis without myelopathy or radiculopathy, lumbar region: Secondary | ICD-10-CM | POA: Diagnosis present

## 2021-10-01 DIAGNOSIS — M5416 Radiculopathy, lumbar region: Secondary | ICD-10-CM | POA: Diagnosis present

## 2021-10-01 DIAGNOSIS — S8255XS Nondisplaced fracture of medial malleolus of left tibia, sequela: Secondary | ICD-10-CM | POA: Diagnosis not present

## 2021-10-01 DIAGNOSIS — Z9689 Presence of other specified functional implants: Secondary | ICD-10-CM | POA: Insufficient documentation

## 2021-10-01 DIAGNOSIS — M961 Postlaminectomy syndrome, not elsewhere classified: Secondary | ICD-10-CM | POA: Diagnosis present

## 2021-10-01 DIAGNOSIS — M797 Fibromyalgia: Secondary | ICD-10-CM | POA: Insufficient documentation

## 2021-10-01 DIAGNOSIS — M25572 Pain in left ankle and joints of left foot: Secondary | ICD-10-CM | POA: Diagnosis not present

## 2021-10-01 DIAGNOSIS — M4804 Spinal stenosis, thoracic region: Secondary | ICD-10-CM

## 2021-10-01 DIAGNOSIS — M48061 Spinal stenosis, lumbar region without neurogenic claudication: Secondary | ICD-10-CM | POA: Insufficient documentation

## 2021-10-01 DIAGNOSIS — G8929 Other chronic pain: Secondary | ICD-10-CM | POA: Diagnosis present

## 2021-10-01 DIAGNOSIS — G894 Chronic pain syndrome: Secondary | ICD-10-CM | POA: Diagnosis present

## 2021-10-01 DIAGNOSIS — Z9889 Other specified postprocedural states: Secondary | ICD-10-CM | POA: Diagnosis present

## 2021-10-01 MED ORDER — OXYCODONE-ACETAMINOPHEN 10-325 MG PO TABS: 1.0000 | ORAL_TABLET | Freq: Two times a day (BID) | ORAL | 0 refills | Status: AC | PRN

## 2021-10-01 MED ORDER — OXYCODONE-ACETAMINOPHEN 10-325 MG PO TABS: 1.0000 | ORAL_TABLET | Freq: Two times a day (BID) | ORAL | 0 refills | Status: DC | PRN

## 2021-10-01 NOTE — Progress Notes (Signed)
PROVIDER NOTE: Information contained herein reflects review and annotations entered in association with encounter. Interpretation of such information and data should be left to medically-trained personnel. Information provided to patient can be located elsewhere in the medical record under "Patient Instructions". Document created using STT-dictation technology, any transcriptional errors that may result from process are unintentional.    Patient: Brittney [name]  Service Category: E/M  Provider: [name], MD  DOB: June 26, 1951  DOS: 10/01/2021  Specialty: Interventional Pain Management  MRN: [id number]  Setting: Ambulatory outpatient  PCP: Rusty Aus, MD  Type: Established Patient    Referring Provider: Rusty Aus, MD  Location: Office  Delivery: Face-to-face     HPI  Brittney [name], a 70 y.o. year old female, is here today because of her Closed nondisplaced fracture of medial malleolus of left tibia, sequela [S82.55XS]. Ms. Brannick primary complain today is Back Pain (lower) Last encounter: My last encounter with her was on 08/06/21 Pertinent problems: Ms. 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 Pain Assessment: Severity of Chronic pain is reported as a 6 /10. Location: Back Lower/both legs. Onset: More than a month ago. Quality: Aching. Timing: Constant. Modifying factor(s): medications, heat. Vitals:  height is '5\' 6"'  (1.676 m) and weight is 310 lb (140.6 kg) (abnormal). Her temporal temperature is 97.3 F (36.3 C) (abnormal). Her blood pressure is 151/71 (abnormal) and her pulse is 68. Her respiration is 16 and oxygen saturation is 93%.   Reason for encounter: medication management.   Patient follows up today for medication management.  Since her last clinic visit with me, she did have a fall that resulted in a closed fracture of her left ankle.  Nonoperative treatment.  States that she is improving and has noticed greater range of motion since her fracture.  She continues her multimodal pain regimen as prescribed.  Again she was counseled on Percocet intake with other sedative medications.  She states that she is utilizing less Xanax and only utilizes it when she has a severe panic attack.  Otherwise she continues her multimodal analgesics as prescribed.  We will refill as below.  No change in dose.  She continues spinal cord stimulation.  Pharmacotherapy Assessment  Analgesic: Percocet 10 mg twice daily as needed, quantity 60/month   Monitoring: [place] PMP: PDMP reviewed during this encounter.       Pharmacotherapy: No side-effects or adverse reactions reported. Compliance: No problems identified. Effectiveness: Clinically acceptable.  Landis Martins, RN  10/01/2021  2:57 PM  Sign when Signing Visit Nursing Pain Medication Assessment:  Safety precautions to be maintained throughout the outpatient stay will include: orient to surroundings, keep bed in low position, maintain call bell within  reach at all times, provide  assistance with transfer out of bed and ambulation.  Medication Inspection Compliance: Pill count conducted under aseptic conditions, in front of the patient. Neither the pills nor the bottle was removed from the patient's sight at any time. Once count was completed pills were immediately returned to the patient in their original bottle.  Medication: Oxycodone/APAP Pill/Patch Count:  34 of 90 pills remain Pill/Patch Appearance: Markings consistent with prescribed medication Bottle Appearance: Standard pharmacy container. Clearly labeled. Filled Date: 09 / 02 / 2022 Last Medication intake:  Today      UDS:  Summary  Date Value Ref Range Status  02/12/2021 Note  Final    Comment:    ==================================================================== [employer] [facility] [employer] (MW) ==================================================================== Test                             Result       Flag       Units  Drug Present and Declared for Prescription Verification   Alprazolam                     382          EXPECTED   ng/mg creat   Alpha-hydroxyalprazolam        1128         EXPECTED   ng/mg creat    Source of alprazolam is a scheduled prescription medication. Alpha-    hydroxyalprazolam is an expected metabolite of alprazolam.    Oxycodone                      1151         EXPECTED   ng/mg creat   Oxymorphone                    889          EXPECTED   ng/mg creat   Noroxycodone                   1083         EXPECTED   ng/mg creat   Noroxymorphone                 323          EXPECTED   ng/mg creat    Sources of oxycodone are scheduled prescription medications.    Oxymorphone, noroxycodone, and noroxymorphone are expected    metabolites of oxycodone. Oxymorphone is also available as a    scheduled prescription medication.    Tramadol                       >3311        EXPECTED   ng/mg creat   O-Desmethyltramadol            >3311        EXPECTED   ng/mg creat   N-Desmethyltramadol             1035         EXPECTED   ng/mg creat    Source of tramadol is a prescription medication. O-desmethyltramadol    and N-desmethyltramadol are expected metabolites of tramadol.  Drug Absent but Declared for Prescription Verification   Butalbital                     Not Detected UNEXPECTED ==================================================================== Test  Result    Flag   Units      Ref Range   Creatinine              151              mg/dL      >=20 ==================================================================== Declared Medications:  The flagging and interpretation on this report are based on the  following declared medications.  Unexpected results may arise from  inaccuracies in the declared medications.   **Note: The testing scope of this panel includes these medications:   Alprazolam (Xanax)  Butalbital (Fioricet)  Oxycodone (Percocet)  Tramadol (Ultram)   **Note: The testing scope of this panel does not include the  following reported medications:   Acetaminophen (Fioricet)  Acetaminophen (Percocet)  Albuterol (Ventolin HFA)  Albuterol (Duoneb)  Budesonide (Pulmicort)  Caffeine (Fioricet)  Carvedilol (Coreg)  Clonidine (Catapres)  Cyclobenzaprine (Flexeril)  Doxazosin (Cardura)  Escitalopram (Lexapro)  Fluticasone (Flonase)  Furosemide (Lasix)  Gabapentin (Neurontin)  Hydralazine (Apresoline)  Ibuprofen (Advil)  Insulin (Humulin)  Ipratropium (Duoneb)  Levothyroxine (Synthroid)  Linaclotide (Linzess)  Metoclopramide (Reglan)  Metolazone (Zaroxolyn)  Ondansetron (Zofran)  Pantoprazole (Protonix)  Potassium (Klor-Con)  Pramipexole (Mirapex)  Telmisartan (Micardis)  Trazodone (Desyrel)  Zolpidem (Ambien) ==================================================================== For clinical consultation, please call [phone]. ====================================================================      ROS   Constitutional: Denies any fever or chills Gastrointestinal: No reported hemesis, hematochezia, vomiting, or acute GI distress Musculoskeletal: Denies any acute onset joint swelling, redness, loss of ROM, or weakness Neurological: No reported episodes of acute onset apraxia, aphasia, dysarthria, agnosia, amnesia, paralysis, loss of coordination, or loss of consciousness  Medication Review  ALPRAZolam, Sodium Hypochlorite, Vitamin D-3, albuterol, aspirin EC, budesonide, carvedilol, cloNIDine, cyclobenzaprine, diclofenac Sodium, escitalopram, fluticasone, furosemide, gabapentin, hydrALAZINE, ibuprofen, insulin pump, ipratropium-albuterol, levothyroxine, linaclotide, magnesium oxide, metolazone, ondansetron, oxyCODONE-acetaminophen, pantoprazole, potassium chloride, pramipexole, telmisartan, traMADol, traZODone, and vitamin C  History Review  Allergy: Ms. Tien is allergic to shellfish allergy, cephalexin, amlodipine, codeine, imdur [isosorbide dinitrate], lyrica [pregabalin], monosodium glutamate, and tape. Drug: Ms. Qualley  reports no history of drug use. Alcohol:  reports no history of alcohol use. Tobacco:  reports that she has never smoked. She has never used smokeless tobacco. Social: Ms. Bares  reports that she has never smoked. She has never used smokeless tobacco. She reports that she does not drink alcohol and does not use drugs. Medical:  has a past medical history of Abdominal pain, left upper quadrant (12/11/2012), Anemia, Arthritis, Asthma, Broken leg (2014), Cancer ([place]) (2007), Chronic kidney disease, stage 2 (mild), Chronic lower back pain, Collagen vascular disease ([place]), Complication of anesthesia, Coronary artery disease, Diabetes mellitus without complication ([place]), Diabetic nephropathy associated with secondary diabetes mellitus ([place]), Flushing (12/11/2012), Gastroparesis, Gross hematuria (12/11/2012), Hepatic cirrhosis ([place]), History of kidney stones, Hyperlipidemia,  Hypertension, Hypothyroidism, Hypothyroidism, IBS (irritable bowel syndrome), Kidney stone (12/11/2012), Lower extremity edema, Morbid obesity with BMI of 40.0-44.9, adult ([place]), Motion sickness, MRSA (methicillin resistant staph aureus) culture positive (2016), Myocardial infarction ([place]) (09/2013), Nausea without vomiting (12/11/2012), Nephrolithiasis, Nephrolithiasis (04/26/2014), Nonproliferative retinopathy due to secondary diabetes ([place]), Numbness and tingling of right leg, Peripheral vascular disease ([place]), Renal colic (92/12/69), Renal insufficiency, Sciatica, Sciatica (12/11/2012), Skin cancer (08/2018), Sleep apnea, Spinal cord stimulator status, Stroke ([place]) (07/2020), Unilateral small kidney without contralateral hypertrophy, Wears dentures, and Wheelchair dependent. Surgical: Ms. Dzik  has a past surgical history that includes Cesarean section (1980); Dilation and curettage of uterus; Eye surgery (1986); Abdominal hysterectomy; Tonsillectomy; Esophagogastroduodenoscopy (02/08/2014); Colonoscopy (05/04/2001); Esophagogastroduodenoscopy (  egd) with propofol (N/A, 09/20/2016); Colonoscopy with propofol (N/A, 09/20/2016); Mohs surgery (11/2016); Cholecystectomy; Hernia repair; Joint replacement (Left, 09/24/2013); Back surgery (04/1999); Pulse generator implant (N/A, 11/02/2017); Esophagogastroduodenoscopy (egd) with propofol (N/A, 05/23/2018); Esophagogastroduodenoscopy (egd) with propofol (N/A, 07/18/2020); and Cataract extraction w/PHACO (Right, 09/17/2021). Family: family history includes Asthma in her mother; Cancer in her mother; Diabetes in her father; Heart disease in her mother; Hypertension in her father; Kidney disease in her father.  Laboratory Chemistry Profile   Renal Lab Results  Component Value Date   BUN 55 (H) 07/17/2021   CREATININE 1.53 (H) 07/17/2021   GFRAA >60 08/21/2020   GFRNONAA 37 (L) 07/17/2021    Hepatic Lab Results  Component Value Date   AST 27 07/07/2021   ALT  23 07/07/2021   ALBUMIN 3.1 (L) 07/07/2021   ALKPHOS 78 07/07/2021   LIPASE 114 10/22/2014    Electrolytes Lab Results  Component Value Date   NA 133 (L) 07/17/2021   K 4.2 07/17/2021   CL 97 (L) 07/17/2021   CALCIUM 10.6 (H) 07/17/2021   MG 2.0 07/09/2021   PHOS 3.0 08/21/2020    Bone Lab Results  Component Value Date   25OHVITD1 26 (L) 12/07/2016   25OHVITD2 1.2 12/07/2016   25OHVITD3 25 12/07/2016    Inflammation (CRP: Acute Phase) (ESR: Chronic Phase) Lab Results  Component Value Date   CRP 1.2 (H) 12/07/2016   ESRSEDRATE 45 (H) 08/18/2020   LATICACIDVEN 1.5 08/17/2020         Note: Above Lab results reviewed.  Recent Imaging Review  US RENAL CLINICAL DATA:  Stage III A chronic renal disease.  EXAM: RENAL / URINARY TRACT ULTRASOUND COMPLETE  COMPARISON:  None.  FINDINGS: Right Kidney:  Renal measurements: 10.4 cm x 4.8 cm x 5.9 cm = volume: 152.0 mL. Diffusely increased echogenicity of the renal parenchyma is noted. No mass or hydronephrosis visualized.  Left Kidney:  Renal measurements: 14.4 cm x 5.5 cm x 6.3 cm = volume: 262.1 mL. Diffusely increased echogenicity of the renal parenchyma is noted. No mass or hydronephrosis visualized.  Bladder:  Appears normal for degree of bladder distention.  Other:  None.  IMPRESSION: Increased renal echogenicity which may be secondary to medical renal disease.  Electronically Signed   By: Virgina Norfolk M.D.   On: 09/08/2021 23:28 Note: Reviewed        Physical Exam  General appearance: Well nourished, well developed, and well hydrated. In no apparent acute distress Mental status: Alert, oriented x 3 (person, place, & time)       Respiratory: No evidence of acute respiratory distress Eyes: PERLA Vitals: BP (!) 151/71 (BP Location: Left Arm, Patient Position: Sitting, Cuff Size: Large)   Pulse 68   Temp (!) 97.3 F (36.3 C) (Temporal)   Resp 16   Ht '5\' 6"'  (1.676 m)   Wt (!) 310 lb (140.6 kg)    SpO2 93%   BMI 50.04 kg/m  BMI: Estimated body mass index is 50.04 kg/m as calculated from the following:   Height as of this encounter: '5\' 6"'  (1.676 m).   Weight as of this encounter: 310 lb (140.6 kg). Ideal: Ideal body weight: 59.3 kg (130 lb 11.7 oz) Adjusted ideal body weight: 91.8 kg (202 lb 7 oz)  Lumbar Spine Area Exam  Skin & Axial Inspection: Well healed scar from previous spine surgery detected, IPG present from spinal cord stimulator Alignment: Symmetrical Functional ROM: Pain restricted ROM       Stability:  No instability detected Muscle Tone/Strength: Functionally intact. No obvious neuro-muscular anomalies detected. Sensory (Neurological): Dermatomal pain pattern and neurogenic   Gait & Posture Assessment  Ambulation: Patient came in today in a wheel chair Gait: Limited. Using assistive device to ambulate Posture: Difficulty standing up straight, due to pain  Lower Extremity Exam      Side: Right lower extremity   Side: Left lower extremity  Stability: No instability observed           Stability: No instability observed          Skin & Extremity Inspection: Pitting edema improved from before   Skin & Extremity Inspection: Pitting edema, improved from before  Functional ROM: Pain restricted ROM for all joints of the lower extremity           Functional ROM: Pain restricted ROM for all joints of the lower extremity          Muscle Tone/Strength: Moderate-to-severe deconditioning, generalized lower extremity weakness   Muscle Tone/Strength: Moderate-to-severe deconditioning generalized lower extremity weakness  Sensory (Neurological): Neurogenic pain pattern         Sensory (Neurological): Neurogenic pain pattern                 Assessment   Status Diagnosis  Controlled Controlled Controlled 1. Closed nondisplaced fracture of medial malleolus of left tibia, sequela   2. Chronic pain of left ankle   3. Fibromyalgia   4. Spinal stenosis of thoracic region   5.  Status post lumbar spine surgery for decompression of spinal cord   6. L3-4 severe lumbar spinal stenosis (12/31/2016 MRI)   7. L3-4 severe lumbar facet hypertrophy and spinal stenosis   8. Failed back surgical syndrome   9. Spinal cord stimulator status ([place])   10. Chronic pain syndrome        Plan of Care   Ms. DESARAI BARRACK has a current medication list which includes the following long-term medication(s): albuterol, carvedilol, escitalopram, fluticasone, gabapentin, insulin pump, ipratropium-albuterol, pantoprazole, pramipexole, telmisartan, tramadol, trazodone, and metolazone.  Pharmacotherapy (Medications Ordered): Meds ordered this encounter  Medications   oxyCODONE-acetaminophen (PERCOCET) 10-325 MG tablet    Sig: Take 1 tablet by mouth every 12 (twelve) hours as needed for pain. Must last 30 days.    Dispense:  60 tablet    Refill:  0    Chronic Pain: STOP Act (Not applicable) Fill 1 day early if closed on refill date. Avoid benzodiazepines within 8 hours of opioids   oxyCODONE-acetaminophen (PERCOCET) 10-325 MG tablet    Sig: Take 1 tablet by mouth every 12 (twelve) hours as needed for pain. Must last 30 days.    Dispense:  60 tablet    Refill:  0    Chronic Pain: STOP Act (Not applicable) Fill 1 day early if closed on refill date. Avoid benzodiazepines within 8 hours of opioids   oxyCODONE-acetaminophen (PERCOCET) 10-325 MG tablet    Sig: Take 1 tablet by mouth every 12 (twelve) hours as needed for pain. Must last 30 days.    Dispense:  60 tablet    Refill:  0    Chronic Pain: STOP Act (Not applicable) Fill 1 day early if closed on refill date. Avoid benzodiazepines within 8 hours of opioids   Continue multimodal analgesics as prescribed. Continue with spinal cord stimulation  Follow-up plan:   Return in about 14 weeks (around 01/07/2022).     s/p GNB (R) 07/18/19-helpful, provided greater than 50% pain relief for  5 days however patient did sustain a fall  due to UTI associated sepsis did spend 3 days in the hospital.  Prior to her fall she was obtaining significant pain relief with genicular nerve block.  Status post right genicular RFA on 08/29/2019.  Left GN RFA on 02/12/19.             Recent Visits Date Type Provider Dept  08/06/21 Telemedicine [name], MD [facility]  Showing recent visits within past 90 days and meeting all other requirements Today's Visits Date Type Provider Dept  10/01/21 Office Visit [name], MD [facility]  Showing today's visits and meeting all other requirements Future Appointments No visits were found meeting these conditions. Showing future appointments within next 90 days and meeting all other requirements I discussed the assessment and treatment plan with the patient. The patient was provided an opportunity to ask questions and all were answered. The patient agreed with the plan and demonstrated an understanding of the instructions.  Patient advised to call back or seek an in-person evaluation if the symptoms or condition worsens.  Duration of encounter: 4mnutes.  Note by: BGillis Santa MD Date: 10/01/2021; Time: 4:03 PM

## 2021-10-01 NOTE — Progress Notes (Signed)
Nursing Pain Medication Assessment:  Safety precautions to be maintained throughout the outpatient stay will include: orient to surroundings, keep bed in low position, maintain call bell within reach at all times, provide assistance with transfer out of bed and ambulation.  Medication Inspection Compliance: Pill count conducted under aseptic conditions, in front of the patient. Neither the pills nor the bottle was removed from the patient's sight at any time. Once count was completed pills were immediately returned to the patient in their original bottle.  Medication: Oxycodone/APAP Pill/Patch Count:  34 of 90 pills remain Pill/Patch Appearance: Markings consistent with prescribed medication Bottle Appearance: Standard pharmacy container. Clearly labeled. Filled Date: 09 / 02 / 2022 Last Medication intake:  Today

## 2021-10-06 ENCOUNTER — Inpatient Hospital Stay: Payer: Medicare Other

## 2021-10-06 ENCOUNTER — Inpatient Hospital Stay: Payer: Medicare Other | Admitting: Oncology

## 2021-10-08 ENCOUNTER — Encounter: Payer: Self-pay | Admitting: Oncology

## 2021-10-08 ENCOUNTER — Other Ambulatory Visit (INDEPENDENT_AMBULATORY_CARE_PROVIDER_SITE_OTHER): Payer: Self-pay | Admitting: Nephrology

## 2021-10-08 DIAGNOSIS — N182 Chronic kidney disease, stage 2 (mild): Secondary | ICD-10-CM

## 2021-10-08 DIAGNOSIS — N2889 Other specified disorders of kidney and ureter: Secondary | ICD-10-CM

## 2021-10-14 ENCOUNTER — Ambulatory Visit (INDEPENDENT_AMBULATORY_CARE_PROVIDER_SITE_OTHER): Payer: Medicare Other

## 2021-10-14 ENCOUNTER — Other Ambulatory Visit: Payer: Self-pay

## 2021-10-14 DIAGNOSIS — N182 Chronic kidney disease, stage 2 (mild): Secondary | ICD-10-CM

## 2021-10-14 DIAGNOSIS — N2889 Other specified disorders of kidney and ureter: Secondary | ICD-10-CM

## 2021-10-15 ENCOUNTER — Encounter: Payer: Medicare Other | Attending: Physician Assistant | Admitting: Physician Assistant

## 2021-10-15 DIAGNOSIS — S8012XA Contusion of left lower leg, initial encounter: Secondary | ICD-10-CM | POA: Diagnosis not present

## 2021-10-15 DIAGNOSIS — Z794 Long term (current) use of insulin: Secondary | ICD-10-CM | POA: Diagnosis not present

## 2021-10-15 DIAGNOSIS — J449 Chronic obstructive pulmonary disease, unspecified: Secondary | ICD-10-CM | POA: Diagnosis not present

## 2021-10-15 DIAGNOSIS — L97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 necrosis of muscle: Secondary | ICD-10-CM | POA: Insufficient documentation

## 2021-10-15 DIAGNOSIS — I129 Hypertensive chronic kidney disease with stage 1 through stage 4 chronic kidney disease, or unspecified chronic kidney disease: Secondary | ICD-10-CM | POA: Insufficient documentation

## 2021-10-15 DIAGNOSIS — E11622 Type 2 diabetes mellitus with other skin ulcer: Secondary | ICD-10-CM | POA: Diagnosis not present

## 2021-10-15 DIAGNOSIS — S8011XA Contusion of right lower leg, initial encounter: Secondary | ICD-10-CM | POA: Diagnosis not present

## 2021-10-15 DIAGNOSIS — X58XXXA Exposure to other specified factors, initial encounter: Secondary | ICD-10-CM | POA: Diagnosis not present

## 2021-10-15 DIAGNOSIS — N182 Chronic kidney disease, stage 2 (mild): Secondary | ICD-10-CM | POA: Insufficient documentation

## 2021-10-15 DIAGNOSIS — E1122 Type 2 diabetes mellitus with diabetic chronic kidney disease: Secondary | ICD-10-CM | POA: Diagnosis not present

## 2021-10-15 NOTE — Progress Notes (Addendum)
Brittney, [name] ([id number]) Visit Report for 10/15/2021 Chief Complaint Document Details Patient Name: Brittney [name], Brittney [name]. Date of Service: 10/15/2021 3:30 PM Medical Record Number: [id number] Patient Account Number: [id number] Date of Birth/Sex: 1951/05/22 (70 y.o. F) Treating RN: Dolan Amen Primary Care Provider: Emily Filbert Other Clinician: Referring Provider: Emily Filbert Treating Provider/Extender: Skipper Cliche in Treatment: 16 Information Obtained from: Patient Chief Complaint Right LE Hematoma following contusion with new left LE hematoma first noted on 07/30/21 Electronic Signature(s) Signed: 10/15/2021 4:08:00 PM By: Worthy Keeler PA-C Entered By: Worthy Keeler on 10/15/2021 16:08:00 Brittney [name] ([id number]) -------------------------------------------------------------------------------- HPI Details Patient Name: Brittney [name], [name] Date of Service: 10/15/2021 3:30 PM Medical Record Number: [id number] Patient Account Number: [id number] Date of Birth/Sex: [birth year] (70 y.o. F) Treating RN: Dolan Amen Primary Care Provider: Emily Filbert Other Clinician: Referring Provider: Emily Filbert Treating Provider/Extender: Skipper Cliche in Treatment: 16 History of Present Illness HPI Description: 06/22/2021 upon evaluation today patient appears to be doing somewhat poorly in regard to the wound that actually started around the beginning of May. She got a an IT trainer wheelchair which she tells me was for the most part a blessing. With that being said she unfortunately had an issue where she was in the bathroom and subsequently had left it on when she stopped to do something at the counter. In the end she actually had some issues here with running into the cabinet. She tells me that she actually hit the cabinet a couple times and could not get the chair to back up and to stop therefore she had quite a bit of damage to her leg. Her primary care provider put her in a  boot on this trying to get it to reabsorb but unfortunately that is just not the case and not what we see happen. Fortunately there does not appear to be any signs of active infection at this time she is on doxycycline for a sinus infection completely separate from this issue but nonetheless that does seem to be a good antibiotic generally for skin infections and I think is at least something good for her currently. She does have some discomfort. Her daughter is present during the evaluation today. I did explain to the patient and her daughter that I do believe her get have to evacuate the hematoma this is already open and to be honest its not getting healed very well until we clear this out. 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 With that being said her hemoglobin A1c is stated to be around 7.5 which is actually good and should not impede her healing this is great news. Overall I am pleased with what I am hearing in that regard. With that being said I still think this is going to take quite a while for this patient to heal we do need however to clear away the necrotic and nonviable/healthy tissue. 06/30/2021 upon evaluation today patient appears to be doing well with regard to her wound although there is some signs of infection. We did have to perform a fairly significant debridement last week. Obviously this has been somewhat painful for her which I think is part of the issue but the other part I think is simply that she may have an infection here which is problematic as well. We definitely need to try to do something to improve this. I did obtain a wound  culture today we will see what that shows. 07/30/21 upon evaluation today patient presents for reevaluation here in the clinic since I last saw her she's actually been in the hospital due to having fractured her left  ankle region. Unfortunately she also has a significant hematoma of the left leg which has not been addressed either as far as evacuating hematoma currently. She does have a fairly tight ace wrap on the left foot and ankle region this does not go any higher however. With that being said I did discuss with the patient currently today as well as her daughter who was present that we do need to tried to clear away the necrotic tissue on the surface where the hematoma is and then subsequently evacuate as much of the hematoma as possible. With that being said there does not appear to be any evidence of infection she is on antibiotic currently. That is Bactrim DS. That's actually a good one for the time being. She's been using Dakin solution on the right anterior shin where she had a previous hematoma. Now it looks like that's probably to be the best option for the left as well. I have not actually seen her since July 5 and the reason is because she did have the fault, fractured ankle, and was in the hospital until just recently. 08/06/2021 upon evaluation today patient appears to be doing actually decently well in regard to the wound on her right leg there is a lot of healing happening and I am very pleased in that regard. In regard to her left leg this actually is also dramatically improved compared to last week. The biggest issue she is having with her ankle at this point is that she did have a popping sensation she is not exactly sure what happened here but to be honest its been hurting her quite severely since yesterday she was not even able to get into the office at the orthopedic clinic to get this checked out to try to work on getting a mobile x-ray out. With that being said she is still having a tremendous amount of pain in the wound also on the left leg hurts as well. They have been doing the Dakin's moistened gauze dressing in an effective way and I do feel like that it is doing great at this point.  There is some slough and biofilm that could require sharp debridement but to be honest she is having so much pain and so much going on that I would get and defer that at this point. 08/27/2021 upon evaluation today patient appears to be doing well with regard to her wound. She has been tolerating the dressing changes without complication. Fortunately there does not appear to be any signs of active infection which is great news and overall very pleased with where things stand. Both wounds are looking better the [facility] is being used on the right the [place] solution on the left. 10/15/2021 upon evaluation today patient appears to be doing well with regard to her leg ulcers. Both are showing signs of excellent improvement which is great news. Fortunately there does not appear to be any evidence of active infection which is also great news. No fevers, chills, nausea, vomiting, or diarrhea. Electronic Signature(s) Signed: 10/15/2021 5:17:35 PM By: Worthy Keeler PA-C Entered By: Worthy Keeler on 10/15/2021 17:17:35 Brittney [name] ([id number]) -------------------------------------------------------------------------------- Physical Exam Details Patient Name: Brittney [name], [name] Date of Service: 10/15/2021 3:30 PM Medical Record Number: [id number] Patient Account Number:  [id number] Date of Birth/Sex: Aug 31, 1951 (70 y.o. F) Treating RN: Dolan Amen Primary Care Provider: Emily Filbert Other Clinician: Referring Provider: Emily Filbert Treating Provider/Extender: Skipper Cliche in Treatment: 87 Constitutional Well-nourished and well-hydrated in no acute distress. Respiratory normal breathing without difficulty. Psychiatric this patient is able to make decisions and demonstrates good insight into disease process. Alert and Oriented x 3. pleasant and cooperative. Notes Upon inspection patient's wound bed showed signs of good granulation and epithelization at this point. Fortunately there  does not appear to be any signs of infection which is also and her ankle seems to be doing some better 2. The 1 issue is she did not have the Tubigrip following which I think has made the left leg little bit more swollen they put just a sleeve on but no compression. Electronic Signature(s) Signed: 10/15/2021 5:17:59 PM By: Worthy Keeler PA-C Entered By: Worthy Keeler on 10/15/2021 17:17:59 Brittney [name] ([id number]) -------------------------------------------------------------------------------- Physician Orders Details Patient Name: ELARA, COCKE B. Date of Service: 10/15/2021 3:30 PM Medical Record Number: [id number] Patient Account Number: [id number] Date of Birth/Sex: 09/13/1951 (70 y.o. 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 [place] for wound care. May utilize formulary equivalent dressing for wound treatment orders unless otherwise specified. Home Health Nurse may visit PRN to address patientos wound care needs. o Scheduled days for dressing changes to be completed; exception, patient has scheduled wound care visit that day. Bathing/ Shower/  Hygiene o May shower; gently cleanse wound with antibacterial soap, rinse and pat dry prior to dressing wounds o No tub bath. Edema Control - Lymphedema / Segmental Compressive Device / Other o Tubigrip single layer applied. - Both legs o Elevate legs to the level of the heart and pump ankles as often as possible o Elevate leg(s) parallel to the floor when sitting. Additional Orders / Instructions o Follow Nutritious Diet and Increase Protein Intake Wound Treatment Wound #1 - Lower Leg Wound Laterality: Right Cleanser: Soap and Water 2 x Per Week/30 Days Discharge Instructions: Gently cleanse wound with antibacterial soap, rinse and pat dry prior to dressing wounds Primary Dressing: Hydrofera Blue Ready Transfer Foam, 2.5x2.5 (in/in) (Generic) 2 x Per Week/30 Days Discharge Instructions: Apply Hydrofera Blue Ready to wound bed as directed Secondary Dressing: Mepilex Border Flex, 4x4 (in/in) (DME) (Generic) 2 x Per Week/30 Days Discharge Instructions: Apply to wound as directed. Do not cut. Secured With: Tubigrip Size F, 4x10 (in/yd) 2 x Per Week/30 Days Discharge Instructions: Apply Tubigrip F 3 finger-widths below knee to base of toes to secure dressing and/or for swelling. Wound #2 - Lower Leg Wound Laterality: Left Cleanser: Soap and Water 2 x Per Week/30 Days Discharge Instructions: Gently cleanse wound with antibacterial soap, rinse and pat dry prior to dressing wounds Primary Dressing: Hydrofera Blue Ready Transfer Foam,  2.5x2.5 (in/in) (Generic) 2 x Per Week/30 Days Discharge Instructions: Apply Hydrofera Blue Ready to wound bed as directed DANYELLA, MCGINTY ([id number]) Secondary Dressing: ABD Pad 5x9 (in/in) 2 x Per Week/30 Days Discharge Instructions: Cover with ABD pad Secondary Dressing: Kerlix 4.5 x 4.1 (in/yd) 2 x Per Week/30 Days Discharge Instructions: Secure ABD Secured With: Tubigrip Size F, 4x10 (in/yd) 2 x Per Week/30 Days Discharge Instructions: Apply  Tubigrip F 3 finger-widths below knee to base of toes to secure dressing and/or for swelling. Electronic Signature(s) Signed: 10/15/2021 4:40:38 PM By: Dolan Amen RN Signed: 10/16/2021 4:28:58 PM By: Worthy Keeler PA-C Entered By: Dolan Amen on 10/15/2021 16:31:53 Brittney [name] ([id number]) -------------------------------------------------------------------------------- Problem List Details Patient Name: SHAMA, MONFILS B. Date of Service: 10/15/2021 3:30 PM Medical Record Number: [id number] Patient Account Number: [id number] Date of Birth/Sex: 10-23-1951 (70 y.o. F) Treating RN: Dolan Amen Primary Care Provider: Emily Filbert Other Clinician: Referring Provider: Emily Filbert Treating Provider/Extender: Skipper Cliche in Treatment: 16 Active Problems ICD-10 Encounter Code Description Active Date MDM Diagnosis S80.11XA Contusion of right lower leg, initial encounter 06/22/2021 No Yes 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 with 06/22/2021 No Yes unspecified severity S80.12XA Contusion of left lower leg, initial encounter 07/30/2021 No Yes 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 of 07/30/2021 No Yes muscle I10 Essential (primary) hypertension 06/22/2021 No Yes N18.2 Chronic kidney disease, stage 2 (mild) 06/22/2021 No Yes J44.9 Chronic obstructive pulmonary disease, unspecified 06/22/2021 No Yes E11.622 Type 2 diabetes mellitus with other skin ulcer 06/22/2021 No Yes Inactive Problems Resolved Problems Electronic Signature(s) Signed: 10/15/2021 4:07:56 PM By: Worthy Keeler PA-C Entered By: Worthy Keeler on 10/15/2021 16:07:55 Brittney [name] ([id number]) -------------------------------------------------------------------------------- Progress Note Details Patient Name: Brittney Gentles B. Date of Service: 10/15/2021 3:30 PM Medical Record Number: [id number] Patient Account Number: [id number] Date  of Birth/Sex: 1951-07-24 (70 y.o. F) Treating RN: Dolan Amen Primary Care Provider: Emily Filbert Other Clinician: Referring Provider: Emily Filbert Treating Provider/Extender: Skipper Cliche in Treatment: 16 Subjective Chief Complaint Information obtained from Patient Right LE Hematoma following contusion with new left LE hematoma first noted on 07/30/21 History of Present Illness (HPI) 06/22/2021 upon evaluation today patient appears to be doing somewhat poorly in regard to the wound that actually started around the beginning of May. She got a an IT trainer wheelchair which she tells me was for the most part a blessing. With that being said she unfortunately had an issue where she was in the bathroom and subsequently had left it on when she stopped to do something at the counter. In the end she actually had some issues here with running into the cabinet. She tells me that she actually hit the cabinet a couple times and could not get the chair to back up and to stop therefore she had quite a bit of damage to her leg. Her primary care provider put her in a boot on this trying to get it to reabsorb but unfortunately that is just not the case and not what we see happen. Fortunately there does not appear to be any signs of active infection at this time she is on doxycycline for a sinus infection completely separate from this issue but nonetheless that does seem to be a good antibiotic generally for skin infections and I think is at least something good for her currently. She does have some discomfort. Her daughter is present during the evaluation today.  I did explain to the patient and her daughter that I do believe her get have to evacuate the hematoma this is already open and to be honest its not getting healed very well until we clear this out. She does have a history of hypertension, chronic kidney disease stage II, COPD, and diabetes mellitus type 2 although she is insulin-dependent requiring  insulin currently. With that being said her hemoglobin A1c is stated to be around 7.5 which is actually good and should not impede her healing this is great news. Overall I am pleased with what I am hearing in that regard. With that being said I still think this is going to take quite a while for this patient to heal we do need however to clear away the necrotic and nonviable/healthy tissue. 06/30/2021 upon evaluation today patient appears to be doing well with regard to her wound although there is some signs of infection. We did have to perform a fairly significant debridement last week. Obviously this has been somewhat painful for her which I think is part of the issue but the other part I think is simply that she may have an infection here which is problematic as well. We definitely need to try to do something to improve this. I did obtain a wound culture today we will see what that shows. 07/30/21 upon evaluation today patient presents for reevaluation here in the clinic since I last saw her she's actually been in the hospital due to having fractured her left ankle region. Unfortunately she also has a significant hematoma of the left leg which has not been addressed either as far as evacuating hematoma currently. She does have a fairly tight ace wrap on the left foot and ankle region this does not go any higher however. With that being said I did discuss with the patient currently today as well as her daughter who was present that we do need to tried to clear away the necrotic tissue on the surface where the hematoma is and then subsequently evacuate as much of the hematoma as possible. With that being said there does not appear to be any evidence of infection she is on antibiotic currently. That is Bactrim DS. That's actually a good one for the time being. She's been using Dakin solution on the right anterior shin where she had a previous hematoma. Now it looks like that's probably to be the best  option for the left as well. I have not actually seen her since July 5 and the reason is because she did have the fault, fractured ankle, and was in the hospital until just recently. 08/06/2021 upon evaluation today patient appears to be doing actually decently well in regard to the wound on her right leg there is a lot of healing happening and I am very pleased in that regard. In regard to her left leg this actually is also dramatically improved compared to last week. The biggest issue she is having with her ankle at this point is that she did have a popping sensation she is not exactly sure what happened here but to be honest its been hurting her quite severely since yesterday she was not even able to get into the office at the orthopedic clinic to get this checked out to try to work on getting a mobile x-ray out. With that being said she is still having a tremendous amount of pain in the wound also on the left leg hurts as well. They have been doing the Dakin's  moistened gauze dressing in an effective way and I do feel like that it is doing great at this point. There is some slough and biofilm that could require sharp debridement but to be honest she is having so much pain and so much going on that I would get and defer that at this point. 08/27/2021 upon evaluation today patient appears to be doing well with regard to her wound. She has been tolerating the dressing changes without complication. Fortunately there does not appear to be any signs of active infection which is great news and overall very pleased with where things stand. Both wounds are looking better the [facility] is being used on the right the [place] solution on the left. 10/15/2021 upon evaluation today patient appears to be doing well with regard to her leg ulcers. Both are showing signs of excellent improvement which is great news. Fortunately there does not appear to be any evidence of active infection which is also great news. No  fevers, chills, nausea, vomiting, or diarrhea. Objective CELESTA, [place]DER[place]K ([id number]) Constitutional Well-nourished and well-hydrated in no acute distress. Vitals Time Taken: 3:45 PM, Height: 66 in, Weight: 329 lbs, BMI: 53.1, Temperature: 98.4 F, Pulse: 70 bpm, Respiratory Rate: 18 breaths/min, Blood Pressure: 179/89 mmHg. Respiratory normal breathing without difficulty. Psychiatric this patient is able to make decisions and demonstrates good insight into disease process. Alert and Oriented x 3. pleasant and cooperative. General Notes: Upon inspection patient's wound bed showed signs of good granulation and epithelization at this point. Fortunately there does not appear to be any signs of infection which is also and her ankle seems to be doing some better 2. The 1 issue is she did not have the Tubigrip following which I think has made the left leg little bit more swollen they put just a sleeve on but no compression. Integumentary (Hair, Skin) Wound #1 status is Open. Original cause of wound was Hematoma. The date acquired was: 04/26/2021. The wound has been in treatment 16 weeks. The wound is located on the Right Lower Leg. The wound measures 1.5cm length x 1cm width x 0.1cm depth; 1.178cm^2 area and 0.118cm^3 volume. There is Fat Layer (Subcutaneous Tissue) exposed. There is no tunneling or undermining noted. There is a medium amount of serosanguineous drainage noted. There is large (67-100%) red granulation within the wound bed. There is no necrotic tissue within the wound bed. Wound #2 status is Open. Original cause of wound was Hematoma. The date acquired was: 07/06/2021. The wound has been in treatment 11 weeks. The wound is located on the Left Lower Leg. The wound measures 4.8cm length x 2.6cm width x 0.2cm depth; 9.802cm^2 area and 1.96cm^3 volume. There is Fat Layer (Subcutaneous Tissue) exposed. There is no tunneling or undermining noted. There is a large amount of sanguinous  drainage noted. There is large (67-100%) red, pink granulation within the wound bed. There is a small (1-33%) amount of necrotic tissue within the wound bed including Adherent Slough. 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 Home Health: [place] for wound care.  May utilize formulary equivalent dressing for wound treatment orders unless otherwise specified. Home Health Nurse may visit PRN to address patient s wound care needs. Scheduled days for dressing changes to be completed; exception, patient has scheduled wound care visit that day. Bathing/ Shower/ Hygiene: May shower; gently cleanse wound with antibacterial soap, rinse and pat dry prior to dressing wounds No tub bath. Edema Control - Lymphedema / Segmental Compressive Device / Other: Tubigrip single layer applied. - Both legs Elevate legs to the level of the heart and pump ankles as often as possible Elevate leg(s) parallel to the floor when sitting. Additional Orders / Instructions: Follow Nutritious Diet and Increase Protein Intake WOUND #1: - Lower Leg Wound Laterality: Right Cleanser: Soap and Water 2 x Per Week/30 Days Discharge Instructions: Gently cleanse wound with antibacterial soap, rinse and pat dry prior to dressing wounds Primary Dressing: Hydrofera Blue Ready Transfer Foam, 2.5x2.5 (in/in) (Generic) 2 x Per Week/30 Days Discharge Instructions: Apply Hydrofera Blue Ready to wound bed as  directed KAMAREE, BERKEL ([id number]) Secondary Dressing: Mepilex Border Flex, 4x4 (in/in) (DME) (Generic) 2 x Per Week/30 Days Discharge Instructions: Apply to wound as directed. Do not cut. Secured With: Tubigrip Size F, 4x10 (in/yd) 2 x Per Week/30 Days Discharge Instructions: Apply Tubigrip F 3 finger-widths below knee to base of toes to secure dressing and/or for swelling. WOUND #2: - Lower Leg Wound Laterality: Left Cleanser: Soap and Water 2 x Per Week/30 Days Discharge Instructions: Gently cleanse wound with antibacterial soap, rinse and pat dry prior to dressing wounds Primary Dressing: Hydrofera Blue Ready Transfer Foam, 2.5x2.5 (in/in) (Generic) 2 x Per Week/30 Days Discharge Instructions: Apply Hydrofera Blue Ready to wound bed as directed Secondary Dressing: ABD Pad 5x9 (in/in) 2 x Per Week/30 Days Discharge Instructions: Cover with ABD pad Secondary Dressing: Kerlix 4.5 x 4.1 (in/yd) 2 x Per Week/30 Days Discharge Instructions: Secure ABD Secured With: Tubigrip Size F, 4x10 (in/yd) 2 x Per Week/30 Days Discharge Instructions: Apply Tubigrip F 3 finger-widths below knee to base of toes to secure dressing and/or for swelling. 1. Would recommend currently that we going to continue with wound care measures as before and the patient is in agreement with the plan. This includes the use of the [facility] dressing followed by the border foam to cover. 2. Also can recommend that we use Tubigrip for both locations size F in order to help with edema control. 3. I would also recommend patient should continue to elevate her legs much as possible to help with edema control. We will see patient back for reevaluation in 2 weeks here in the clinic. If anything worsens or changes patient will contact our office for additional recommendations. Electronic Signature(s) Signed: 10/15/2021 5:18:32 PM By: Worthy Keeler PA-C Entered By: Worthy Keeler on 10/15/2021 17:18:32 Brittney [name] ([id number]) -------------------------------------------------------------------------------- SuperBill Details Patient Name: Brittney Gentles B. Date of Service: 10/15/2021 Medical Record Number: [id number] Patient Account Number: [id number] Date of Birth/Sex: June 15, 1951 (70 y.o. 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, stage 2 (mild)  J44.9 Chronic obstructive pulmonary disease, unspecified E11.622 Type 2 diabetes mellitus with other skin ulcer Facility Procedures CPT4 Code: 50093818 Description: 29937 - WOUND CARE VISIT-LEV 3 EST PT Modifier: Quantity: 1 Physician Procedures CPT4 Code: 1696789 Description: 38101 - WC PHYS LEVEL 4 - EST PT Modifier: Quantity: 1 CPT4 Code: Description: ICD-10 Diagnosis Description S80.11XA Contusion of right lower leg, initial encounter 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

## 2021-10-15 NOTE — Progress Notes (Signed)
AREONNA, BRAN ([id number]) Visit Report for 10/15/2021 Arrival Information Details Patient Name: DANILLE, OPPEDISANO. Date of Service: 10/15/2021 3:30 PM Medical Record Number: [id number] Patient Account Number: [id number] Date of Birth/Sex: [birth year] (70 y.o. F) Treating RN: Dolan Amen Primary Care Cambrea Kirt: Emily Filbert Other Clinician: Referring Tess Potts: Emily Filbert Treating Millan Legan/Extender: Skipper Cliche in Treatment: 16 Visit Information History Since Last Visit Pain Present Now: No Patient Arrived: Wheel Chair Arrival Time: 15:44 Accompanied By: daughter Transfer Assistance: Manual Patient Identification Verified: Yes Secondary Verification Process Completed: Yes Patient Requires Transmission-Based Precautions: No Patient Has Alerts: Yes Patient Alerts: Type II Diabetic Electronic Signature(s) Signed: 10/15/2021 4:40:38 PM By: Dolan Amen RN Entered By: Dolan Amen on 10/15/2021 15:45:46 [name] ([id number]) -------------------------------------------------------------------------------- Clinic Level of Care Assessment Details Patient Name: Christy Gentles B. Date of Service: 10/15/2021 3:30 PM Medical Record Number: [id number] Patient Account Number: [id number] Date of Birth/Sex: 02-Jun-1951 (70 y.o. F) Treating RN: Dolan Amen Primary Care Isley Weisheit: Emily Filbert Other Clinician: Referring Baruch Lewers: Emily Filbert Treating Miria Cappelli/Extender: Skipper Cliche in Treatment: 16 Clinic Level of Care Assessment Items TOOL 4 Quantity Score X - Use when only an EandM is performed on FOLLOW-UP visit 1 0 ASSESSMENTS - Nursing Assessment / Reassessment X - Reassessment of Co-morbidities (includes updates in patient status) 1 10 X- 1 5 Reassessment of Adherence to Treatment Plan ASSESSMENTS - Wound and Skin Assessment / Reassessment []  - Simple Wound Assessment / Reassessment - one wound 0 X- 2 5 Complex Wound Assessment / Reassessment - multiple  wounds []  - 0 Dermatologic / Skin Assessment (not related to wound area) ASSESSMENTS - Focused Assessment []  - Circumferential Edema Measurements - multi extremities 0 []  - 0 Nutritional Assessment / Counseling / Intervention []  - 0 Lower Extremity Assessment (monofilament, tuning fork, pulses) []  - 0 Peripheral Arterial Disease Assessment (using hand held doppler) ASSESSMENTS - Ostomy and/or Continence Assessment and Care []  - Incontinence Assessment and Management 0 []  - 0 Ostomy Care Assessment and Management (repouching, etc.) PROCESS - Coordination of Care X - Simple Patient / Family Education for ongoing care 1 15 []  - 0 Complex (extensive) Patient / Family Education for ongoing care []  - 0 Staff obtains Programmer, systems, Records, Test Results / Process Orders []  - 0 Staff telephones HHA, Nursing Homes / Clarify orders / etc []  - 0 Routine Transfer to another Facility (non-emergent condition) []  - 0 Routine Hospital Admission (non-emergent condition) []  - 0 New Admissions / Biomedical engineer / Ordering NPWT, Apligraf, etc. []  - 0 Emergency Hospital Admission (emergent condition) X- 1 10 Simple Discharge Coordination []  - 0 Complex (extensive) Discharge Coordination PROCESS - Special Needs []  - Pediatric / Minor Patient Management 0 []  - 0 Isolation Patient Management []  - 0 Hearing / Language / Visual special needs []  - 0 Assessment of Community assistance (transportation, D/C planning, etc.) []  - 0 Additional assistance / Altered mentation []  - 0 Support Surface(s) Assessment (bed, cushion, seat, etc.) INTERVENTIONS - Wound Cleansing / Measurement CANNA, NICKELSON B. ([id number]) []  - 0 Simple Wound Cleansing - one wound X- 2 5 Complex Wound Cleansing - multiple wounds X- 1 5 Wound Imaging (photographs - any number of wounds) []  - 0 Wound Tracing (instead of photographs) []  - 0 Simple Wound Measurement - one wound X- 2 5 Complex Wound Measurement -  multiple wounds INTERVENTIONS - Wound Dressings []  - Small Wound Dressing one or multiple wounds 0 X- 1 15 Medium Wound Dressing one or multiple wounds []  -  0 Large Wound Dressing one or multiple wounds []  - 0 Application of Medications - topical []  - 0 Application of Medications - injection INTERVENTIONS - Miscellaneous []  - External ear exam 0 []  - 0 Specimen Collection (cultures, biopsies, blood, body fluids, etc.) []  - 0 Specimen(s) / Culture(s) sent or taken to Lab for analysis []  - 0 Patient Transfer (multiple staff / Civil Service fast streamer / Similar devices) []  - 0 Simple Staple / Suture removal (25 or less) []  - 0 Complex Staple / Suture removal (26 or more) []  - 0 Hypo / Hyperglycemic Management (close monitor of Blood Glucose) []  - 0 Ankle / Brachial Index (ABI) - do not check if billed separately X- 1 5 Vital Signs Has the patient been seen at the hospital within the last three years: Yes Total Score: 95 Level Of Care: New/Established - Level 3 Electronic Signature(s) Signed: 10/15/2021 4:40:38 PM By: Dolan Amen RN Entered By: Dolan Amen on 10/15/2021 16:32:41 [name] ([id number]) -------------------------------------------------------------------------------- Encounter Discharge Information Details Patient Name: Christy Gentles B. Date of Service: 10/15/2021 3:30 PM Medical Record Number: [id number] Patient Account Number: [id number] Date of Birth/Sex: Aug 15, 1951 (70 y.o. F) Treating RN: Dolan Amen Primary Care Raima Geathers: Emily Filbert Other Clinician: Referring Goddess Gebbia: Emily Filbert Treating Aizah Gehlhausen/Extender: Skipper Cliche in Treatment: 16 Encounter Discharge Information Items Discharge Condition: Stable Ambulatory Status: Wheelchair Discharge Destination: Home Transportation: Private Auto Accompanied By: daughter Schedule Follow-up Appointment: Yes Clinical Summary of Care: Electronic Signature(s) Signed: 10/15/2021 4:34:01 PM By:  Dolan Amen RN Entered By: Dolan Amen on 10/15/2021 16:34:01 [name] ([id number]) -------------------------------------------------------------------------------- Lower Extremity Assessment Details Patient Name: CIANNA, KASPARIAN B. Date of Service: 10/15/2021 3:30 PM Medical Record Number: [id number] Patient Account Number: [id number] Date of Birth/Sex: [birth year] (70 y.o. F) Treating RN: Dolan Amen Primary Care Hayla Hinger: Emily Filbert Other Clinician: Referring Hope Brandenburger: Emily Filbert Treating Sergi Gellner/Extender: Jeri Cos Weeks in Treatment: 16 Edema Assessment Assessed: Shirlyn Goltz: Yes] Patrice Paradise: Yes] Edema: [Left: Yes] [Right: Yes] Electronic Signature(s) Signed: 10/15/2021 4:40:38 PM By: Dolan Amen RN Entered By: Dolan Amen on 10/15/2021 16:02:13 [name] ([id number]) -------------------------------------------------------------------------------- Multi Wound Chart Details Patient Name: SEAIRRA, OTANI B. Date of Service: 10/15/2021 3:30 PM Medical Record Number: [id number] Patient Account Number: [id number] Date of Birth/Sex: [birth year] (70 y.o. F) Treating RN: Dolan Amen Primary Care Tierra Divelbiss: Emily Filbert Other Clinician: Referring Owais Pruett: Emily Filbert Treating Luz Burcher/Extender: Skipper Cliche in Treatment: 16 Vital Signs Height(in): 34 Pulse(bpm): 11 Weight(lbs): 329 Blood Pressure(mmHg): 179/89 Body Mass Index(BMI): 53 Temperature(F): 98.4 Respiratory Rate(breaths/min): 18 Photos: [N/A:N/A] Wound Location: Right Lower Leg Left Lower Leg N/A Wounding Event: Hematoma Hematoma N/A Primary Etiology: Trauma, Other Trauma, Other N/A Comorbid History: Anemia, Lymphedema, Chronic Anemia, Lymphedema, Chronic N/A Obstructive Pulmonary Disease Obstructive Pulmonary Disease (COPD), Sleep Apnea, (COPD), Sleep Apnea, Hypertension, Myocardial Infarction, Hypertension, Myocardial Infarction, Cirrhosis , Type II Diabetes, Cirrhosis , Type II  Diabetes, Osteoarthritis, Neuropathy Osteoarthritis, Neuropathy Date Acquired: 04/26/2021 07/06/2021 N/A Weeks of Treatment: 16 11 N/A Wound Status: Open Open N/A Clustered Wound: Yes No N/A Clustered Quantity: 2 N/A N/A Measurements L x W x D (cm) 1.5x1x0.1 4.8x2.6x0.2 N/A Area (cm) : 1.178 9.802 N/A Volume (cm) : 0.118 1.96 N/A % Reduction in Area: 96.30% 67.60% N/A % Reduction in Volume: 99.50% 91.90% N/A Classification: Full Thickness Without Exposed Full Thickness Without Exposed N/A Support Structures Support Structures Exudate Amount: Medium Large N/A Exudate Type: Serosanguineous Sanguinous N/A Exudate Color: red, brown red N/A Granulation Amount: Large (67-100%) Large (67-100%) N/A Granulation Quality: Red  Red, Pink N/A Necrotic Amount: None Present (0%) Small (1-33%) N/A Exposed Structures: Fat Layer (Subcutaneous Tissue): Fat Layer (Subcutaneous Tissue): N/A Yes Yes Fascia: No Fascia: No Tendon: No Tendon: No Muscle: No Muscle: No Joint: No Joint: No Bone: No Bone: No Epithelialization: Medium (34-66%) Small (1-33%) N/A Treatment Notes Electronic Signature(s) Signed: 10/15/2021 4:40:38 PM By: Dolan Amen RN Wynetta Emery, Heer B. ([id number]) Entered By: Dolan Amen on 10/15/2021 16:11:35 [name] ([id number]) -------------------------------------------------------------------------------- [place] Details Patient Name: MARYAM, FEELY B. Date of Service: 10/15/2021 3:30 PM Medical Record Number: [id number] Patient Account Number: [id number] Date of Birth/Sex: 26-Jun-1951 (70 y.o. F) Treating RN: Dolan Amen Primary Care Maurisa Tesmer: Emily Filbert Other Clinician: Referring Lillar Bianca: Emily Filbert Treating Dickson Kostelnik/Extender: Skipper Cliche in Treatment: 16 Active Inactive Electronic Signature(s) Signed: 10/15/2021 4:40:38 PM By: Dolan Amen RN Entered By: Dolan Amen on 10/15/2021 16:11:27 [name]  ([id number]) -------------------------------------------------------------------------------- Pain Assessment Details Patient Name: MADELYN, TLATELPA B. Date of Service: 10/15/2021 3:30 PM Medical Record Number: [id number] Patient Account Number: [id number] Date of Birth/Sex: Nov 21, 1951 (70 y.o. F) Treating RN: Dolan Amen Primary Care Estiben Mizuno: Emily Filbert Other Clinician: Referring Effa Yarrow: Emily Filbert Treating Damyah Gugel/Extender: Skipper Cliche in Treatment: 16 Active Problems Location of Pain Severity and Description of Pain Patient Has Paino No Site Locations Pain Management and Medication Current Pain Management: Electronic Signature(s) Signed: 10/15/2021 4:40:38 PM By: Dolan Amen RN Entered By: Dolan Amen on 10/15/2021 15:48:14 [name] ([id number]) -------------------------------------------------------------------------------- Patient/Caregiver Education Details Patient Name: SHELVIE, SALSBERRY B. Date of Service: 10/15/2021 3:30 PM Medical Record Number: [id number] Patient Account Number: [id number] Date of Birth/Gender: 1951-11-06 (70 y.o. F) Treating RN: Dolan Amen Primary Care Physician: Emily Filbert Other Clinician: Referring Physician: Emily Filbert Treating Physician/Extender: Skipper Cliche in Treatment: 16 Education Assessment Education Provided To: Patient Education Topics Provided Wound/Skin Impairment: Methods: Explain/Verbal Responses: State content correctly Electronic Signature(s) Signed: 10/15/2021 4:40:38 PM By: Dolan Amen RN Entered By: Dolan Amen on 10/15/2021 16:33:02 [name] ([id number]) -------------------------------------------------------------------------------- Wound Assessment Details Patient Name: AIMI, ESSNER B. Date of Service: 10/15/2021 3:30 PM Medical Record Number: [id number] Patient Account Number: [id number] Date of Birth/Sex: 08-02-1951 (70 y.o. F) Treating RN: Dolan Amen Primary  Care Elic Vencill: Emily Filbert Other Clinician: Referring Joslynn Jamroz: Emily Filbert Treating Lindley Stachnik/Extender: Skipper Cliche in Treatment: 16 Wound Status Wound Number: 1 Primary Trauma, Other Etiology: Wound Location: Right Lower Leg Wound Open Wounding Event: Hematoma Status: Date Acquired: 04/26/2021 Comorbid Anemia, Lymphedema, Chronic Obstructive Pulmonary Weeks Of Treatment: 16 History: Disease (COPD), Sleep Apnea, Hypertension, Myocardial Clustered Wound: Yes Infarction, Cirrhosis , Type II Diabetes, Osteoarthritis, Neuropathy Photos Wound Measurements Length: (cm) 1.5 Width: (cm) 1 Depth: (cm) 0.1 Clustered Quantity: 2 Area: (cm) 1.178 Volume: (cm) 0.118 % Reduction in Area: 96.3% % Reduction in Volume: 99.5% Epithelialization: Medium (34-66%) Tunneling: No Undermining: No Wound Description Classification: Full Thickness Without Exposed Support Structures Exudate Amount: Medium Exudate Type: Serosanguineous Exudate Color: red, brown Foul Odor After Cleansing: No Slough/Fibrino No Wound Bed Granulation Amount: Large (67-100%) Exposed Structure Granulation Quality: Red Fascia Exposed: No Necrotic Amount: None Present (0%) Fat Layer (Subcutaneous Tissue) Exposed: Yes Tendon Exposed: No Muscle Exposed: No Joint Exposed: No Bone Exposed: No Treatment Notes Wound #1 (Lower Leg) Wound Laterality: Right Cleanser Soap and Water Discharge Instruction: Gently cleanse wound with antibacterial soap, rinse and pat dry prior to dressing wounds TERILYNN, BURESH B. ([id number]) Peri-Wound Care Topical Primary Dressing Hydrofera Blue Ready Transfer Foam, 2.5x2.5 (in/in) Discharge Instruction: Apply Hydrofera Blue Ready  to wound bed as directed Secondary Dressing Mepilex Border Flex, 4x4 (in/in) Discharge Instruction: Apply to wound as directed. Do not cut. Secured With Tubigrip Size F, 4x10 (in/yd) Discharge Instruction: Apply Tubigrip F 3 finger-widths below knee  to base of toes to secure dressing and/or for swelling. Compression Wrap Compression Stockings Add-Ons Electronic Signature(s) Signed: 10/15/2021 4:40:38 PM By: Dolan Amen RN Entered By: Dolan Amen on 10/15/2021 [place], [place] ([id number]) -------------------------------------------------------------------------------- Wound Assessment Details Patient Name: YANILEN, ADAMIK B. Date of Service: 10/15/2021 3:30 PM Medical Record Number: [id number] Patient Account Number: [id number] Date of Birth/Sex: Sep 29, 1951 (70 y.o. F) Treating RN: Dolan Amen Primary Care Sanders Manninen: Emily Filbert Other Clinician: Referring Kateleen Encarnacion: Emily Filbert Treating Javaun Dimperio/Extender: Skipper Cliche in Treatment: 16 Wound Status Wound Number: 2 Primary Trauma, Other Etiology: Wound Location: Left Lower Leg Wound Open Wounding Event: Hematoma Status: Date Acquired: 07/06/2021 Comorbid Anemia, Lymphedema, Chronic Obstructive Pulmonary Weeks Of Treatment: 11 History: Disease (COPD), Sleep Apnea, Hypertension, Myocardial Clustered Wound: No Infarction, Cirrhosis , Type II Diabetes, Osteoarthritis, Neuropathy Photos Wound Measurements Length: (cm) 4.8 Width: (cm) 2.6 Depth: (cm) 0.2 Area: (cm) 9.802 Volume: (cm) 1.96 % Reduction in Area: 67.6% % Reduction in Volume: 91.9% Epithelialization: Small (1-33%) Tunneling: No Undermining: No Wound Description Classification: Full Thickness Without Exposed Support Structures Exudate Amount: Large Exudate Type: Sanguinous Exudate Color: red Foul Odor After Cleansing: No Slough/Fibrino No Wound Bed Granulation Amount: Large (67-100%) Exposed Structure Granulation Quality: Red, Pink Fascia Exposed: No Necrotic Amount: Small (1-33%) Fat Layer (Subcutaneous Tissue) Exposed: Yes Necrotic Quality: Adherent Slough Tendon Exposed: No Muscle Exposed: No Joint Exposed: No Bone Exposed: No Treatment Notes Wound #2 (Lower Leg) Wound  Laterality: Left Cleanser Soap and Water Discharge Instruction: Gently cleanse wound with antibacterial soap, rinse and pat dry prior to dressing wounds Peri-Wound Care SHAQUITA, FORT ([id number]) Topical Primary Dressing Hydrofera Blue Ready Transfer Foam, 2.5x2.5 (in/in) Discharge Instruction: Apply Hydrofera Blue Ready to wound bed as directed Secondary Dressing ABD Pad 5x9 (in/in) Discharge Instruction: Cover with ABD pad Kerlix 4.5 x 4.1 (in/yd) Discharge Instruction: Secure ABD Secured With Tubigrip Size F, 4x10 (in/yd) Discharge Instruction: Apply Tubigrip F 3 finger-widths below knee to base of toes to secure dressing and/or for swelling. Compression Wrap Compression Stockings Add-Ons Electronic Signature(s) Signed: 10/15/2021 4:40:38 PM By: Dolan Amen RN Entered By: Dolan Amen on 10/15/2021 16:00:49 [name] ([id number]) -------------------------------------------------------------------------------- Vitals Details Patient Name: Christy Gentles B. Date of Service: 10/15/2021 3:30 PM Medical Record Number: [id number] Patient Account Number: [id number] Date of Birth/Sex: February 05, 1951 (70 y.o. F) Treating RN: Dolan Amen Primary Care Perfecto Purdy: Emily Filbert Other Clinician: Referring Christy Ehrsam: Emily Filbert Treating Chandra Asher/Extender: Skipper Cliche in Treatment: 16 Vital Signs Time Taken: 15:45 Temperature (F): 98.4 Height (in): 66 Pulse (bpm): 70 Weight (lbs): 329 Respiratory Rate (breaths/min): 18 Body Mass Index (BMI): 53.1 Blood Pressure (mmHg): 179/89 Reference Range: 80 - 120 mg / dl Electronic Signature(s) Signed: 10/15/2021 4:40:38 PM By: Dolan Amen RN Entered By: Dolan Amen on 10/15/2021 15:47:51

## 2021-10-26 ENCOUNTER — Encounter: Payer: Medicare Other | Admitting: Physician Assistant

## 2021-10-26 ENCOUNTER — Other Ambulatory Visit: Payer: Self-pay

## 2021-10-26 DIAGNOSIS — E11622 Type 2 diabetes mellitus with other skin ulcer: Secondary | ICD-10-CM | POA: Diagnosis not present

## 2021-10-26 NOTE — Progress Notes (Addendum)
KRIYA, WESTRA ([id number]) Visit Report for 10/26/2021 Arrival Information Details Patient Name: Brittney [name], Brittney [name]. Date of Service: 10/26/2021 11:30 AM Medical Record Number: [id number] Patient Account Number: [id number] Date of Birth/Sex: 03/26/1951 (70 y.o. F) Treating RN: Cornell Barman Primary Care Donnell Beauchamp: Emily Filbert Other Clinician: Referring Hearl Heikes: Emily Filbert Treating Addam Goeller/Extender: Skipper Cliche in Treatment: 18 Visit Information History Since Last Visit Added or deleted any medications: No Patient Arrived: Wheel Chair Has Dressing in Place as Prescribed: Yes Arrival Time: 11:52 Pain Present Now: No Accompanied By: Monna Fam Transfer Assistance: Manual Patient Requires Transmission-Based No Precautions: Patient Has Alerts: Yes Patient Alerts: Patient on Blood Thinner Type II Diabetic 81 MG aspirin Electronic Signature(s) Signed: 10/26/2021 3:17:27 PM By: Gretta Cool, BSN, RN, CWS, Kim RN, BSN Entered By: Gretta Cool, BSN, RN, CWS, Kim on 10/26/2021 12:26:38 [name] ([id number]) -------------------------------------------------------------------------------- Clinic Level of Care Assessment Details Patient Name: Brittney [name], [name] Date of Service: 10/26/2021 11:30 AM Medical Record Number: [id number] Patient Account Number: [id number] Date of Birth/Sex: Aug 23, 1951 (70 y.o. F) Treating RN: Cornell Barman Primary Care Girtha Kilgore: Emily Filbert Other Clinician: Referring Tea Collums: Emily Filbert Treating Caylyn Tedeschi/Extender: Skipper Cliche in Treatment: 18 Clinic Level of Care Assessment Items TOOL 3 Quantity Score []  - Use when EandM and Procedure is performed on FOLLOW-UP visit 0 ASSESSMENTS - Nursing Assessment / Reassessment []  - Reassessment of Co-morbidities (includes updates in patient status) 0 []  - 0 Reassessment of Adherence to Treatment Plan ASSESSMENTS - Wound and Skin Assessment / Reassessment []  - Points for Wound Assessment can only be taken  for a new wound of unknown or different etiology and a 0 procedure is NOT performed to that wound []  - 0 Simple Wound Assessment / Reassessment - one wound X- 1 5 Complex Wound Assessment / Reassessment - multiple wounds []  - 0 Dermatologic / Skin Assessment (not related to wound area) ASSESSMENTS - Focused Assessment []  - Circumferential Edema Measurements - multi extremities 0 []  - 0 Nutritional Assessment / Counseling / Intervention []  - 0 Lower Extremity Assessment (monofilament, tuning fork, pulses) []  - 0 Peripheral Arterial Disease Assessment (using hand held doppler) ASSESSMENTS - Ostomy and/or Continence Assessment and Care []  - Incontinence Assessment and Management 0 []  - 0 Ostomy Care Assessment and Management (repouching, etc.) PROCESS - Coordination of Care []  - Points for Discharge Coordination can only be taken for a new wound of unknown or different etiology and a 0 procedure is NOT performed to that wound []  - 0 Simple Patient / Family Education for ongoing care []  - 0 Complex (extensive) Patient / Family Education for ongoing care []  - 0 Staff obtains Programmer, systems, Records, Test Results / Process Orders []  - 0 Staff telephones HHA, Nursing Homes / Clarify orders / etc []  - 0 Routine Transfer to another Facility (non-emergent condition) []  - 0 Routine Hospital Admission (non-emergent condition) []  - 0 New Admissions / Biomedical engineer / Ordering NPWT, Apligraf, etc. []  - 0 Emergency Hospital Admission (emergent condition) []  - 0 Simple Discharge Coordination []  - 0 Complex (extensive) Discharge Coordination PROCESS - Special Needs []  - Pediatric / Minor Patient Management 0 []  - 0 Isolation Patient Management []  - 0 Hearing / Language / Visual special needs []  - 0 Assessment of Community assistance (transportation, D/C planning, etc.) Brittney [name], [name] ([id number]) []  - 0 Additional assistance / Altered mentation []  - 0 Support Surface(s)  Assessment (bed, cushion, seat, etc.) INTERVENTIONS - Wound Cleansing / Measurement []  - Points for Wound Cleaning /  Measurement, Wound Dressing, Specimen Collection and Specimen taken to lab 0 can only be taken for a new wound of unknown or different etiology and a procedure is NOT performed to that wound []  - 0 Simple Wound Cleansing - one wound X- 1 5 Complex Wound Cleansing - multiple wounds []  - 0 Wound Imaging (photographs - any number of wounds) []  - 0 Wound Tracing (instead of photographs) []  - 0 Simple Wound Measurement - one wound X- 1 5 Complex Wound Measurement - multiple wounds INTERVENTIONS - Wound Dressings []  - Small Wound Dressing one or multiple wounds 0 []  - 0 Medium Wound Dressing one or multiple wounds X- 1 20 Large Wound Dressing one or multiple wounds INTERVENTIONS - Miscellaneous []  - External ear exam 0 []  - 0 Specimen Collection (cultures, biopsies, blood, body fluids, etc.) []  - 0 Specimen(s) / Culture(s) sent or taken to Lab for analysis []  - 0 Patient Transfer (multiple staff / Civil Service fast streamer / Similar devices) []  - 0 Simple Staple / Suture removal (25 or less) []  - 0 Complex Staple / Suture removal (26 or more) []  - 0 Hypo / Hyperglycemic Management (close monitor of Blood Glucose) []  - 0 Ankle / Brachial Index (ABI) - do not check if billed separately X- 1 5 Vital Signs Has the patient been seen at the hospital within the last three years: Yes Total Score: 40 Level Of Care: New/Established - Level 2 Notes Patient has new trauma wound on right leg. Electronic Signature(s) Signed: 10/26/2021 3:17:27 PM By: Gretta Cool, BSN, RN, CWS, Kim RN, BSN Entered By: Gretta Cool, BSN, RN, CWS, Kim on 10/26/2021 13:00:21 [name] ([id number]) -------------------------------------------------------------------------------- Encounter Discharge Information Details Patient Name: Brittney [name], BOUDREAU. Date of Service: 10/26/2021 11:30 AM Medical Record  Number: [id number] Patient Account Number: [id number] Date of Birth/Sex: [birth year] (70 y.o. F) Treating RN: Cornell Barman Primary Care Julyssa Kyer: Emily Filbert Other Clinician: Referring Lyndie Vanderloop: Emily Filbert Treating Augustino Savastano/Extender: Skipper Cliche in Treatment: 18 Encounter Discharge Information Items Post Procedure Vitals Discharge Condition: Stable Temperature (F): 98.7 Ambulatory Status: Wheelchair Pulse (bpm): 67 Discharge Destination: Home Respiratory Rate (breaths/min): 18 Transportation: Private Auto Blood Pressure (mmHg): 122/65 Accompanied By: daughter Schedule Follow-up Appointment: Yes Clinical Summary of Care: Electronic Signature(s) Signed: 10/26/2021 3:17:27 PM By: Gretta Cool, BSN, RN, CWS, Kim RN, BSN Entered By: Gretta Cool, BSN, RN, CWS, Kim on 10/26/2021 13:02:55 [name] ([id number]) -------------------------------------------------------------------------------- Lower Extremity Assessment Details Patient Name: Brittney [name], [name] Date of Service: 10/26/2021 11:30 AM Medical Record Number: [id number] Patient Account Number: [id number] Date of Birth/Sex: 04/27/1951 (70 y.o. F) Treating RN: Cornell Barman Primary Care Kortne All: Emily Filbert Other Clinician: Referring Aaban Griep: Emily Filbert Treating Karesha Trzcinski/Extender: Skipper Cliche in Treatment: 18 Edema Assessment Assessed: [Left: No] [Right: No] [Left: Edema] [Right: :] Calf Left: Right: Point of Measurement: 33 cm From Medial Instep 43 cm 55 cm Ankle Left: Right: Point of Measurement: 11 cm From Medial Instep 34.5 cm 34 cm Vascular Assessment Pulses: Dorsalis Pedis Palpable: [Left:Yes] [Right:Yes] Blood Pressure: Brachial: [Left:122] Dorsalis Pedis: 140 Ankle: Posterior Tibial: 160 Ankle Brachial Index: [Left:1.31] Electronic Signature(s) Signed: 10/26/2021 3:17:27 PM By: Gretta Cool, BSN, RN, CWS, Kim RN, BSN Entered By: Gretta Cool, BSN, RN, CWS, Kim on 10/26/2021 12:37:15 [name]  ([id number]) -------------------------------------------------------------------------------- Multi Wound Chart Details Patient Name: Christy Gentles B. Date of Service: 10/26/2021 11:30 AM Medical Record Number: [id number] Patient Account Number: [id number] Date of Birth/Sex: 1951-07-30 (70 y.o. F) Treating RN: Cornell Barman Primary Care Karanveer Ramakrishnan: Emily Filbert Other Clinician: Referring  Daune Colgate: Emily Filbert Treating Jennie Bolar/Extender: Skipper Cliche in Treatment: 18 Vital Signs Height(in): 2 Pulse(bpm): 18 Weight(lbs): 329 Blood Pressure(mmHg): 122/65 Body Mass Index(BMI): 53 Temperature(F): 98.7 Respiratory Rate(breaths/min): 16 Photos: [1:No Photos] [2:No Photos] Wound Location: Right Lower Leg Left Lower Leg Right, Medial Lower Leg Wounding Event: Hematoma Hematoma Hematoma Primary Etiology: Trauma, Other Trauma, Other Trauma, Other Comorbid History: N/A N/A Anemia, Lymphedema, Chronic Obstructive Pulmonary Disease (COPD), Sleep Apnea, Hypertension, Myocardial Infarction, Cirrhosis , Type II Diabetes, Osteoarthritis, Neuropathy Date Acquired: 04/26/2021 07/06/2021 10/22/2021 Weeks of Treatment: 18 12 0 Wound Status: Open Open Open Clustered Wound: Yes No No Measurements L x W x D (cm) 0.9x1x0.1 3.2x1.7x0.1 11x10x0.1 Area (cm) : 0.707 4.273 86.394 Volume (cm) : 0.071 0.427 8.639 % Reduction in Area: 97.70% 85.90% N/A % Reduction in Volume: 99.70% 98.20% N/A Classification: Full Thickness Without Exposed Full Thickness Without Exposed Unclassifiable Support Structures Support Structures Exudate Amount: Medium Large N/A Exudate Type: Serosanguineous Sanguinous N/A Exudate Color: red, brown red N/A Granulation Amount: N/A N/A None Present (0%) Necrotic Amount: N/A N/A None Present (0%) Wound Number: 4 N/A N/A Photos: N/A N/A Wound Location: Left, Distal, Midline Lower Leg N/A N/A Wounding Event: Hematoma N/A N/A Primary Etiology: Trauma, Other N/A N/A Comorbid  History: Anemia, Lymphedema, Chronic N/A N/A Obstructive Pulmonary Disease Brittney [name], CIRRINCIONE. ([id number]) (COPD), Sleep Apnea, Hypertension, Myocardial Infarction, Cirrhosis , Type II Diabetes, Osteoarthritis, Neuropathy Date Acquired: 10/20/2021 N/A N/A Weeks of Treatment: 0 N/A N/A Wound Status: Open N/A N/A Clustered Wound: No N/A N/A Measurements L x W x D (cm) 2x2.2x0.1 N/A N/A Area (cm) : 3.456 N/A N/A Volume (cm) : 0.346 N/A N/A % Reduction in Area: N/A N/A N/A % Reduction in Volume: N/A N/A N/A Classification: Unclassifiable N/A N/A Exudate Amount: N/A N/A N/A Exudate Type: N/A N/A N/A Exudate Color: N/A N/A N/A Granulation Amount: N/A N/A N/A Necrotic Amount: N/A N/A N/A Treatment Notes Electronic Signature(s) Signed: 10/26/2021 3:17:27 PM By: Gretta Cool, BSN, RN, CWS, Kim RN, BSN Entered By: Gretta Cool, BSN, RN, CWS, Kim on 10/26/2021 12:38:42 [name] ([id number]) -------------------------------------------------------------------------------- [place] Details Patient Name: Brittney [name], [name] Date of Service: 10/26/2021 11:30 AM Medical Record Number: [id number] Patient Account Number: [id number] Date of Birth/Sex: July 15, 1951 (70 y.o. F) Treating RN: Cornell Barman Primary Care Courteney Alderete: Emily Filbert Other Clinician: Referring Keyara Ent: Emily Filbert Treating Adedamola Seto/Extender: Skipper Cliche in Treatment: 18 Active Inactive Electronic Signature(s) Signed: 12/02/2021 8:57:39 AM By: Gretta Cool, BSN, RN, CWS, Kim RN, BSN Previous Signature: 10/26/2021 3:17:27 PM Version By: Gretta Cool, BSN, RN, CWS, Kim RN, BSN Entered By: Gretta Cool, BSN, RN, CWS, Kim on 12/02/2021 08:57:39 [name] ([id number]) -------------------------------------------------------------------------------- Pain Assessment Details Patient Name: Brittney [name], [name] Date of Service: 10/26/2021 11:30 AM Medical Record Number: [id number] Patient Account Number: [id number] Date of  Birth/Sex: 1951/11/26 (70 y.o. F) Treating RN: Cornell Barman Primary Care Naseer Hearn: Emily Filbert Other Clinician: Referring Khiya Friese: Emily Filbert Treating Tishanna Dunford/Extender: Skipper Cliche in Treatment: 18 Active Problems Location of Pain Severity and Description of Pain Patient Has Paino Yes Site Locations Pain Location: Pain in Ulcers Rate the pain. Current Pain Level: 6 Pain Management and Medication Current Pain Management: Notes Pain only in new wound. Electronic Signature(s) Signed: 10/26/2021 3:17:27 PM By: Gretta Cool, BSN, RN, CWS, Kim RN, BSN Entered By: Gretta Cool, BSN, RN, CWS, Kim on 10/26/2021 11:57:34 [name] ([id number]) -------------------------------------------------------------------------------- Patient/Caregiver Education Details Patient Name: Brittney [name], [name] Date of Service: 10/26/2021 11:30 AM Medical Record Number: [id number] Patient Account Number:  [id number] Date of Birth/Gender: 1951/02/16 (70 y.o. F) Treating RN: Cornell Barman Primary Care Physician: Emily Filbert Other Clinician: Referring Physician: Emily Filbert Treating Physician/Extender: Skipper Cliche in Treatment: 18 Education Assessment Education Provided To: Patient Education Topics Provided Safety: Handouts: Nurse, children's, Other: Do not run chair into sink or other items. Methods: Demonstration, Explain/Verbal, Printed Responses: State content correctly Wound Debridement: Handouts: Wound Debridement Methods: Demonstration, Explain/Verbal Responses: State content correctly Electronic Signature(s) Signed: 10/26/2021 3:17:27 PM By: Gretta Cool, BSN, RN, CWS, Kim RN, BSN Entered By: Gretta Cool, BSN, RN, CWS, Kim on 10/26/2021 13:01:25 [name] ([id number]) -------------------------------------------------------------------------------- Wound Assessment Details Patient Name: Brittney [name], [name] Date of Service: 10/26/2021 11:30 AM Medical Record Number: [id number] Patient Account  Number: [id number] Date of Birth/Sex: 06/28/1951 (70 y.o. F) Treating RN: Cornell Barman Primary Care Cavion Faiola: Emily Filbert Other Clinician: Referring Kourosh Jablonsky: Emily Filbert Treating Berlinda Farve/Extender: Skipper Cliche in Treatment: 18 Wound Status Wound Number: 1 Primary Etiology: Trauma, Other Wound Location: Right Lower Leg Wound Status: Open Wounding Event: Hematoma Date Acquired: 04/26/2021 Weeks Of Treatment: 18 Clustered Wound: Yes Wound Measurements Length: (cm) 0.9 Width: (cm) 1 Depth: (cm) 0.1 Area: (cm) 0.707 Volume: (cm) 0.071 % Reduction in Area: 97.7% % Reduction in Volume: 99.7% Wound Description Classification: Full Thickness Without Exposed Support Structu Exudate Amount: Medium Exudate Type: Serosanguineous Exudate Color: red, brown res Electronic Signature(s) Signed: 10/26/2021 3:17:27 PM By: Gretta Cool, BSN, RN, CWS, Kim RN, BSN Entered By: Gretta Cool, BSN, RN, CWS, Kim on 10/26/2021 12:09:50 [name] ([id number]) -------------------------------------------------------------------------------- Wound Assessment Details Patient Name: SHAQUILA, SIGMAN B. Date of Service: 10/26/2021 11:30 AM Medical Record Number: [id number] Patient Account Number: [id number] Date of Birth/Sex: 23-Feb-1951 (70 y.o. F) Treating RN: Cornell Barman Primary Care Taishawn Smaldone: Emily Filbert Other Clinician: Referring Shauntia Levengood: Emily Filbert Treating Nea Gittens/Extender: Skipper Cliche in Treatment: 18 Wound Status Wound Number: 2 Primary Etiology: Trauma, Other Wound Location: Left Lower Leg Wound Status: Open Wounding Event: Hematoma Date Acquired: 07/06/2021 Weeks Of Treatment: 12 Clustered Wound: No Wound Measurements Length: (cm) 3.2 Width: (cm) 1.7 Depth: (cm) 0.1 Area: (cm) 4.273 Volume: (cm) 0.427 % Reduction in Area: 85.9% % Reduction in Volume: 98.2% Wound Description Classification: Full Thickness Without Exposed Support Structu Exudate Amount: Large Exudate Type:  Sanguinous Exudate Color: red res Electronic Signature(s) Signed: 10/26/2021 3:17:27 PM By: Gretta Cool, BSN, RN, CWS, Kim RN, BSN Entered By: Gretta Cool, BSN, RN, CWS, Kim on 10/26/2021 12:09:50 [name] ([id number]) -------------------------------------------------------------------------------- Wound Assessment Details Patient Name: XAN, SPARKMAN B. Date of Service: 10/26/2021 11:30 AM Medical Record Number: [id number] Patient Account Number: [id number] Date of Birth/Sex: [birth year] (70 y.o. F) Treating RN: Cornell Barman Primary Care Custer Pimenta: Emily Filbert Other Clinician: Referring Mechel Schutter: Emily Filbert Treating Fateh Kindle/Extender: Skipper Cliche in Treatment: 18 Wound Status Wound Number: 3 Primary Trauma, Other Etiology: Wound Location: Right, Medial Lower Leg Wound Open Wounding Event: Hematoma Status: Date Acquired: 10/22/2021 Comorbid Anemia, Lymphedema, Chronic Obstructive Pulmonary Weeks Of Treatment: 0 History: Disease (COPD), Sleep Apnea, Hypertension, Myocardial Clustered Wound: No Infarction, Cirrhosis , Type II Diabetes, Osteoarthritis, Neuropathy Photos Wound Measurements Length: (cm) 11 Width: (cm) 10 Depth: (cm) 1.5 Area: (cm) 86.394 Volume: (cm) 129.591 % Reduction in Area: 0% % Reduction in Volume: 0% Wound Description Classification: Unclassifiable Wound Bed Granulation Amount: None Present (0%) Necrotic Amount: None Present (0%) Electronic Signature(s) Signed: 10/26/2021 3:17:27 PM By: Gretta Cool, BSN, RN, CWS, Kim RN, BSN Entered By: Gretta Cool, BSN, RN, CWS, Kim on 10/26/2021 12:45:08 [name] ([id number]) -------------------------------------------------------------------------------- Wound Assessment Details  Patient Name: GRIFFIN, DEWILDE. Date of Service: 10/26/2021 11:30 AM Medical Record Number: [id number] Patient Account Number: [id number] Date of Birth/Sex: 1951/05/01 (70 y.o. F) Treating RN: Cornell Barman Primary Care Angeliyah Kirkey:  Emily Filbert Other Clinician: Referring Parissa Chiao: Emily Filbert Treating Azarian Starace/Extender: Skipper Cliche in Treatment: 18 Wound Status Wound Number: 4 Primary Trauma, Other Etiology: Wound Location: Left, Distal, Midline Lower Leg Wound Open Wounding Event: Hematoma Status: Date Acquired: 10/20/2021 Comorbid Anemia, Lymphedema, Chronic Obstructive Pulmonary Weeks Of Treatment: 0 History: Disease (COPD), Sleep Apnea, Hypertension, Myocardial Clustered Wound: No Infarction, Cirrhosis , Type II Diabetes, Osteoarthritis, Neuropathy Photos Wound Measurements Length: (cm) Width: (cm) Depth: (cm) Area: (cm) Volume: (cm) 2 % Reduction in Area: 2.2 % Reduction in Volume: 0.1 3.456 0.346 Wound Description Classification: Unclassifiable Electronic Signature(s) Signed: 10/26/2021 3:17:27 PM By: Gretta Cool, BSN, RN, CWS, Kim RN, BSN Entered By: Gretta Cool, BSN, RN, CWS, Kim on 10/26/2021 12:09:01 [name] ([id number]) -------------------------------------------------------------------------------- [place] Details Patient Name: Christy Gentles B. Date of Service: 10/26/2021 11:30 AM Medical Record Number: [id number] Patient Account Number: [id number] Date of Birth/Sex: 1951-11-18 (70 y.o. F) Treating RN: Cornell Barman Primary Care Malaijah Houchen: Emily Filbert Other Clinician: Referring Neyland Pettengill: Emily Filbert Treating Corwyn Vora/Extender: Skipper Cliche in Treatment: 18 Vital Signs Time Taken: 11:56 Temperature (F): 98.7 Height (in): 66 Pulse (bpm): 67 Weight (lbs): 329 Respiratory Rate (breaths/min): 16 Body Mass Index (BMI): 53.1 Blood Pressure (mmHg): 122/65 Reference Range: 80 - 120 mg / dl Electronic Signature(s) Signed: 10/26/2021 3:17:27 PM By: Gretta Cool, BSN, RN, CWS, Kim RN, BSN Entered By: Gretta Cool, BSN, RN, CWS, Kim on 10/26/2021 11:56:52

## 2021-10-26 NOTE — Progress Notes (Addendum)
MERRIE, EPLER ([id number]) Visit Report for 10/26/2021 Chief Complaint Document Details Patient Name: Brittney [name], Brittney [name]. Date of Service: 10/26/2021 11:30 AM Medical Record Number: [id number] Patient Account Number: [id number] Date of Birth/Sex: 1951/02/03 (70 y.o. F) Treating RN: Cornell Barman Primary Care Provider: Emily Filbert Other Clinician: Referring Provider: Emily Filbert Treating Provider/Extender: Skipper Cliche in Treatment: 18 Information Obtained from: Patient Chief Complaint Right LE Hematoma following contusion with new left LE hematoma first noted on 07/30/21 Electronic Signature(s) Signed: 10/26/2021 12:37:13 PM By: Worthy Keeler PA-C Entered By: Worthy Keeler on 10/26/2021 12:37:13 [name] ([id number]) -------------------------------------------------------------------------------- Debridement Details Patient Name: Brittney Gentles B. Date of Service: 10/26/2021 11:30 AM Medical Record Number: [id number] Patient Account Number: [id number] Date of Birth/Sex: 08-28-1951 (70 y.o. F) Treating RN: Cornell Barman Primary Care Provider: Emily Filbert Other Clinician: Referring Provider: Emily Filbert Treating Provider/Extender: Skipper Cliche in Treatment: 18 Debridement Performed for Wound #3 Right,Medial Lower Leg Assessment: Performed By: Physician Tommie Sams., PA-C Debridement Type: Debridement Level of Consciousness (Pre- Awake and Alert procedure): Pre-procedure Verification/Time Out Yes - 12:30 Taken: Total Area Debrided (L x W): 11 (cm) x 10 (cm) = 110 (cm) Tissue and other material Blood Clots, Skin: Dermis , Skin: Epidermis debrided: Level: Skin/Epidermis Debridement Description: Selective/Open Wound Instrument: Forceps, Scissors, Other : scoop Bleeding: Large Hemostasis Achieved: Pressure Response to Treatment: Procedure was tolerated well Level of Consciousness (Post- Awake and Alert procedure): Post Debridement Measurements of  Total Wound Length: (cm) 11 Width: (cm) 10 Depth: (cm) 1.5 Volume: (cm) 129.591 Character of Wound/Ulcer Post Debridement: Requires Further Debridement Post Procedure Diagnosis Same as Pre-procedure Electronic Signature(s) Signed: 10/26/2021 3:17:27 PM By: Gretta Cool, BSN, RN, CWS, Kim RN, BSN Signed: 10/26/2021 3:58:49 PM By: Worthy Keeler PA-C Entered By: Gretta Cool, BSN, RN, CWS, Kim on 10/26/2021 12:40:38 [name] ([id number]) -------------------------------------------------------------------------------- HPI Details Patient Name: Brittney [name], [name] Date of Service: 10/26/2021 11:30 AM Medical Record Number: [id number] Patient Account Number: [id number] Date of Birth/Sex: 06/29/1951 (70 y.o. F) Treating RN: Cornell Barman Primary Care Provider: Emily Filbert Other Clinician: Referring Provider: Emily Filbert Treating Provider/Extender: Skipper Cliche in Treatment: 18 History of Present Illness HPI Description: 06/22/2021 upon evaluation today patient appears to be doing somewhat poorly in regard to the wound that actually started around the beginning of May. She got a an IT trainer wheelchair which she tells me was for the most part a blessing. With that being said she unfortunately had an issue where she was in the bathroom and subsequently had left it on when she stopped to do something at the counter. In the end she actually had some issues here with running into the cabinet. She tells me that she actually hit the cabinet a couple times and could not get the chair to back up and to stop therefore she had quite a bit of damage to her leg. Her primary care provider put her in a boot on this trying to get it to reabsorb but unfortunately that is just not the case and not what we see happen. Fortunately there does not appear to be any signs of active infection at this time she is on doxycycline for a sinus infection completely separate from this issue but nonetheless that does seem to be  a good antibiotic generally for skin infections and I think is at least something good for her currently. She does have some discomfort. Her daughter is present during the evaluation today. I did explain to the patient  and her daughter that I do believe her get have to evacuate the hematoma this is already open and to be honest its not getting healed very well until we clear this out. 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 With that being said her hemoglobin A1c is stated to be around 7.5 which is actually good and should not impede her healing this is great news. Overall I am pleased with what I am hearing in that regard. With that being said I still think this is going to take quite a while for this patient to heal we do need however to clear away the necrotic and nonviable/healthy tissue. 06/30/2021 upon evaluation today patient appears to be doing well with regard to her wound although there is some signs of infection. We did have to perform a fairly significant debridement last week. Obviously this has been somewhat painful for her which I think is part of the issue but the other part I think is simply that she may have an infection here which is problematic as well. We definitely need to try to do something to improve this. I did obtain a wound culture today we will see what that shows. 07/30/21 upon evaluation today patient presents for reevaluation here in the clinic since I last saw her she's actually been in the hospital due to having fractured her left ankle region. Unfortunately she also has a significant hematoma of the left leg which has not been addressed either as far as evacuating hematoma currently. She does have a fairly tight ace wrap on the left foot and ankle region this does not go any higher however. With that being said I did discuss with the patient  currently today as well as her daughter who was present that we do need to tried to clear away the necrotic tissue on the surface where the hematoma is and then subsequently evacuate as much of the hematoma as possible. With that being said there does not appear to be any evidence of infection she is on antibiotic currently. That is Bactrim DS. That's actually a good one for the time being. She's been using Dakin solution on the right anterior shin where she had a previous hematoma. Now it looks like that's probably to be the best option for the left as well. I have not actually seen her since July 5 and the reason is because she did have the fault, fractured ankle, and was in the hospital until just recently. 08/06/2021 upon evaluation today patient appears to be doing actually decently well in regard to the wound on her right leg there is a lot of healing happening and I am very pleased in that regard. In regard to her left leg this actually is also dramatically improved compared to last week. The biggest issue she is having with her ankle at this point is that she did have a popping sensation she is not exactly sure what happened here but to be honest its been hurting her quite severely since yesterday she was not even able to get into the office at the orthopedic clinic to get this checked out to try to work on getting a mobile x-ray out. With that being said she is still having a tremendous amount of pain in the wound also on the left leg hurts as well. They have been doing the Dakin's moistened gauze dressing in an effective way  and I do feel like that it is doing great at this point. There is some slough and biofilm that could require sharp debridement but to be honest she is having so much pain and so much going on that I would get and defer that at this point. 08/27/2021 upon evaluation today patient appears to be doing well with regard to her wound. She has been tolerating the dressing changes  without complication. Fortunately there does not appear to be any signs of active infection which is great news and overall very pleased with where things stand. Both wounds are looking better the [facility] is being used on the right the [place] solution on the left. 10/15/2021 upon evaluation today patient appears to be doing well with regard to her leg ulcers. Both are showing signs of excellent improvement which is great news. Fortunately there does not appear to be any evidence of active infection which is also great news. No fevers, chills, nausea, vomiting, or diarrhea. 10/26/2021 upon evaluation today patient appears to be doing poorly unfortunately in regard to wounds that she has over her especially right lower extremity. When I saw her just 11 days ago she was actually doing great and both legs were very close to complete resolution. Unfortunately this is doing significantly worse now she has a huge hematoma on the right leg that is can require debriding away and clearing away. On the left leg this is not nearly as bad and I am hopeful we get this to reabsorb. Either way this is a significant setback for her of likely months. Electronic Signature(s) Signed: 10/26/2021 1:46:40 PM By: Worthy Keeler PA-C Entered By: Worthy Keeler on 10/26/2021 13:46:40 [name] ([id number]) -------------------------------------------------------------------------------- Physical Exam Details Patient Name: HARRY, BARK B. Date of Service: 10/26/2021 11:30 AM Medical Record Number: [id number] Patient Account Number: [id number] Date of Birth/Sex: 1951/01/25 (70 y.o. F) Treating RN: Cornell Barman Primary Care Provider: Emily Filbert Other Clinician: Referring Provider: Emily Filbert Treating Provider/Extender: Skipper Cliche in Treatment: 69 Constitutional Well-nourished and well-hydrated in no acute distress. Respiratory normal breathing without difficulty. Psychiatric this patient is  able to make decisions and demonstrates good insight into disease process. Alert and Oriented x 3. pleasant and cooperative. Notes Upon inspection patient's wound bed actually did require debridement with scissors and forceps to clear away the necrotic tissue over top of that region of hematoma. Subsequently this was a very extensive debridement today unfortunately. This is going to set her back quite a bit on the right on the left this was small enough I am hopeful we can get this to reabsorb I do not think is going to cause much damage. I did perform debridement the patient had some degree of discomfort but was able to handle this. I was able to remove a significant amount of the hematoma and congealed blood. Not everything was cleared away I Minna let the Dakin's moistened gauze packing take care of this for the most part. Electronic Signature(s) Signed: 10/26/2021 1:47:54 PM By: Worthy Keeler PA-C Entered By: Worthy Keeler on 10/26/2021 13:47:54 [name] ([id number]) -------------------------------------------------------------------------------- Physician Orders Details Patient Name: TRANA, RESSLER B. Date of Service: 10/26/2021 11:30 AM Medical Record Number: [id number] Patient Account Number: [id number] Date of Birth/Sex: 14-Dec-1951 (70 y.o. 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 [place] for wound care. May utilize formulary equivalent dressing for wound treatment orders unless otherwise specified. Home Health Nurse may visit PRN to address patientos wound care needs. o Scheduled days for dressing changes to be completed; exception, patient has scheduled wound care visit that day. o **Please direct any NON-WOUND related issues/requests for orders to patient's Primary Care Physician. **If current dressing causes regression in wound condition, may D/C ordered dressing product/s and apply Normal Saline Moist Dressing daily until next [place] or Other MD appointment. **Notify [facility] of regression in wound condition at [phone]. Bathing/ Shower/ Hygiene o May shower; gently cleanse wound with antibacterial soap, rinse and pat dry prior to dressing wounds - Right o May shower with wound dressing protected with water repellent cover or cast protector. - Left o No tub bath. Edema Control - Lymphedema / Segmental Compressive Device / Other o Tubigrip single layer applied. - Tubigrip F o Elevate legs to the level of the heart and pump ankles as often as possible o Elevate leg(s) parallel to the floor when sitting. Additional Orders / Instructions o Follow Nutritious Diet and Increase Protein Intake Wound Treatment Wound #1 - Lower Leg Wound Laterality: Right Cleanser: Soap and Water 3 x Per Week/30 Days Discharge Instructions: Gently cleanse wound with antibacterial soap, rinse and pat dry prior to dressing wounds Primary Dressing: Hydrofera Blue Ready Transfer Foam, 2.5x2.5 (in/in) (Generic) 3 x Per Week/30 Days Discharge Instructions: Apply Hydrofera Blue Ready to wound bed as directed Secondary  Dressing: Mepilex Border Flex, 4x4 (in/in) (Generic) 3 x Per Week/30 Days Discharge Instructions: Apply to wound as directed. Do not cut. Secured With: Tubigrip Size F, 4x10 (in/yd) 3 x Per Week/30 Days Discharge Instructions: Apply Tubigrip F 3 finger-widths below knee to base of toes to secure dressing and/or for swelling. Wound #2 - Lower Leg Wound Laterality: Left Cleanser: Soap and Water 3 x Per Week/30 Days FERRAH, PANAGOPOULOS ([id number]) Discharge Instructions: Gently cleanse wound with antibacterial soap, rinse and pat dry prior to dressing wounds Primary Dressing: Hydrofera Blue Ready Transfer Foam, 2.5x2.5 (in/in) (Generic) 3 x Per Week/30 Days Discharge Instructions: Apply Hydrofera Blue Ready to wound bed as directed Secondary Dressing: ABD Pad 5x9 (in/in) 3 x Per Week/30 Days Discharge Instructions: Cover with ABD pad Compression Wrap: Profore Lite LF 3 Multilayer Compression Bandaging System 3 x Per Week/30 Days Discharge Instructions: Apply 3 multi-layer wrap as prescribed. Wound #3 - Lower Leg Wound Laterality: Right, Medial Primary Dressing: Gauze 1 x Per Day/15 Days Discharge Instructions: As directed: moistened with Dakins Solution Secured With: Kerlix Roll Sterile or Non-Sterile 6-ply 4.5x4 (yd/yd) 1 x Per Day/15 Days Discharge Instructions: Apply Kerlix as directed Compression Wrap: Tubi F 1 x Per Day/15 Days Wound #4 - Lower Leg Wound Laterality: Left, Midline, Distal Compression Wrap: Profore Lite LF 3 Multilayer Compression Bandaging System 3 x Per Week/15 Days Discharge Instructions: Apply 3 multi-layer wrap as prescribed. Patient Medications Allergies: codeine, adhesive, cephalexin, Imdur,  Norvasc, pregabalin, Shellfish Containing Products, isosorbide, monosodium glutamate Notifications Medication Indication Start End doxycycline hyclate 10/26/2021 DOSE 1 - oral 100 mg capsule - 1 capsule oral taken 2 times per day for 14 days. Do not take magnesium or  a multivitamin with this medication Electronic Signature(s) Signed: 10/26/2021 1:50:36 PM By: Worthy Keeler PA-C Entered By: Worthy Keeler on 10/26/2021 13:50:36 [name] ([id number]) -------------------------------------------------------------------------------- Problem List Details Patient Name: Brittney [name], [name] Date of Service: 10/26/2021 11:30 AM Medical Record Number: [id number] Patient Account Number: [id number] Date of Birth/Sex: 1951-04-08 (70 y.o. 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 of 07/30/2021 No Yes muscle I10 Essential (primary) hypertension 06/22/2021 No Yes N18.2 Chronic kidney disease, stage 2 (mild) 06/22/2021 No Yes J44.9 Chronic obstructive pulmonary disease, unspecified 06/22/2021 No Yes E11.622 Type 2 diabetes mellitus with other skin ulcer 06/22/2021 No Yes Inactive Problems Resolved Problems Electronic Signature(s) Signed: 10/26/2021 12:37:01 PM By: Worthy Keeler PA-C Entered By: Worthy Keeler on 10/26/2021 12:37:01 [name] ([id number]) -------------------------------------------------------------------------------- Progress Note Details Patient Name: Brittney Gentles B. Date of Service: 10/26/2021 11:30 AM Medical Record Number: [id number] Patient Account Number: [id number] Date of Birth/Sex: 04/25/1951 (70 y.o. F) Treating RN: Cornell Barman Primary Care Provider: Emily Filbert Other  Clinician: Referring Provider: Emily Filbert Treating Provider/Extender: Skipper Cliche in Treatment: 18 Subjective Chief Complaint Information obtained from Patient Right LE Hematoma following contusion with new left LE hematoma first noted on 07/30/21 History of Present Illness (HPI) 06/22/2021 upon evaluation today patient appears to be doing somewhat poorly in regard to the wound that actually started around the beginning of May. She got a an IT trainer wheelchair which she tells me was for the most part a blessing. With that being said she unfortunately had an issue where she was in the bathroom and subsequently had left it on when she stopped to do something at the counter. In the end she actually had some issues here with running into the cabinet. She tells me that she actually hit the cabinet a couple times and could not get the chair to back up and to stop therefore she had quite a bit of damage to her leg. Her primary care provider put her in a boot on this trying to get it to reabsorb but unfortunately that is just not the case and not what we see happen. Fortunately there does not appear to be any signs of active infection at this time she is on doxycycline for a sinus infection completely separate from this issue but nonetheless that does seem to be a good antibiotic generally for skin infections and I think is at least something good for her currently. She does have some discomfort. Her daughter is present during the evaluation today. I did explain to the patient and her daughter that I do believe her get have to evacuate the hematoma this is already open and to be honest its not getting healed very well until we clear this out. 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 With that being said her hemoglobin A1c is stated to be around 7.5 which is actually good  and should not impede her healing this is great news. Overall I am pleased with what I am hearing in that regard. With that being said I still think this is going to take quite a while for this patient to heal we do need however to clear away the necrotic and nonviable/healthy tissue. 06/30/2021 upon evaluation today patient appears to be doing well with regard to her wound although there is some signs of infection. We did have to perform a fairly significant debridement last week. Obviously this has been somewhat painful for her which I think is part of the issue but the other part I think is simply that she may have an infection here which is problematic as well. We definitely need to try to do something to improve this. I did obtain a wound culture today we will see what that shows. 07/30/21 upon evaluation today patient presents for reevaluation here in the clinic since I last saw her she's actually been in the hospital due to having fractured her left ankle region. Unfortunately she also has a significant hematoma of the left leg which has not been addressed either as far as evacuating hematoma currently. She does have a fairly tight ace wrap on the left foot and ankle region this does not go any higher however. With that being said I did discuss with the patient currently today as well as her daughter who was present that we do need to tried to clear away the necrotic tissue on the surface where the hematoma is and then subsequently evacuate as much of the hematoma as possible. With that being said there does not appear to be any evidence of infection she is on antibiotic currently. That is Bactrim DS. That's actually a good one for the time being. She's been using Dakin solution on the right anterior shin where she had a previous hematoma. Now it looks like that's probably to be the best option for the left as well. I have not actually seen her since July 5 and the reason is because she did have the  fault, fractured ankle, and was in the hospital until just recently. 08/06/2021 upon evaluation today patient appears to be doing actually decently well in regard to the wound on her right leg there is a lot of healing happening and I am very pleased in that regard. In regard to her left leg this actually is also dramatically improved compared to last week. The biggest issue she is having with her ankle at this point is that she did have a popping sensation she is not exactly sure what happened here but to be honest its been hurting her quite severely since yesterday she was not even able to get into the office at the orthopedic clinic to get this checked out to try to work on getting a mobile x-ray out. With that being said she is still having a tremendous amount of pain in the wound also on the left leg hurts as well. They have been doing the Dakin's moistened gauze dressing in an effective way and I do feel like that it is doing great at this point. There is some slough and biofilm that could require sharp debridement but to be honest she is having so much pain and so much going on that I would  get and defer that at this point. 08/27/2021 upon evaluation today patient appears to be doing well with regard to her wound. She has been tolerating the dressing changes without complication. Fortunately there does not appear to be any signs of active infection which is great news and overall very pleased with where things stand. Both wounds are looking better the [facility] is being used on the right the [place] solution on the left. 10/15/2021 upon evaluation today patient appears to be doing well with regard to her leg ulcers. Both are showing signs of excellent improvement which is great news. Fortunately there does not appear to be any evidence of active infection which is also great news. No fevers, chills, nausea, vomiting, or diarrhea. 10/26/2021 upon evaluation today patient appears to be doing  poorly unfortunately in regard to wounds that she has over her especially right lower extremity. When I saw her just 11 days ago she was actually doing great and both legs were very close to complete resolution. Unfortunately this is doing significantly worse now she has a huge hematoma on the right leg that is can require debriding away and clearing away. On the left leg this is not nearly as bad and I am hopeful we get this to reabsorb. Either way this is a significant setback for her of likely months. Brittney [name], [name] ([id number]) Objective Constitutional Well-nourished and well-hydrated in no acute distress. Vitals Time Taken: 11:56 AM, Height: 66 in, Weight: 329 lbs, BMI: 53.1, Temperature: 98.7 F, Pulse: 67 bpm, Respiratory Rate: 16 breaths/min, Blood Pressure: 122/65 mmHg. Respiratory normal breathing without difficulty. Psychiatric this patient is able to make decisions and demonstrates good insight into disease process. Alert and Oriented x 3. pleasant and cooperative. General Notes: Upon inspection patient's wound bed actually did require debridement with scissors and forceps to clear away the necrotic tissue over top of that region of hematoma. Subsequently this was a very extensive debridement today unfortunately. This is going to set her back quite a bit on the right on the left this was small enough I am hopeful we can get this to reabsorb I do not think is going to cause much damage. I did perform debridement the patient had some degree of discomfort but was able to handle this. I was able to remove a significant amount of the hematoma and congealed blood. Not everything was cleared away I Minna let the Dakin's moistened gauze packing take care of this for the most part. Integumentary (Hair, Skin) Wound #1 status is Open. Original cause of wound was Hematoma. The date acquired was: 04/26/2021. The wound has been in treatment 18 weeks. The wound is located on the Right Lower  Leg. The wound measures 0.9cm length x 1cm width x 0.1cm depth; 0.707cm^2 area and 0.071cm^3 volume. There is a medium amount of serosanguineous drainage noted. Wound #2 status is Open. Original cause of wound was Hematoma. The date acquired was: 07/06/2021. The wound has been in treatment 12 weeks. The wound is located on the Left Lower Leg. The wound measures 3.2cm length x 1.7cm width x 0.1cm depth; 4.273cm^2 area and 0.427cm^3 volume. There is a large amount of sanguinous drainage noted. Wound #3 status is Open. Original cause of wound was Hematoma. The date acquired was: 10/22/2021. The wound is located on the Right,Medial Lower Leg. The wound measures 11cm length x 10cm width x 1.5cm depth; 86.394cm^2 area and 129.591cm^3 volume. There is no granulation within the wound bed. There is no necrotic tissue within  the wound bed. Wound #4 status is Open. Original cause of wound was Hematoma. The date acquired was: 10/20/2021. The wound is located on the Left,Distal,Midline Lower Leg. The wound measures 2cm length x 2.2cm width x 0.1cm depth; 3.456cm^2 area and 0.346cm^3 volume. 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 There was a Selective/Open Wound Skin/Epidermis Debridement with a total area of 110 sq cm performed by Tommie Sams., PA-C. With the following instrument(s): Forceps, Scissors, scoop Material removed includes Blood Clots, Skin: Dermis, and Skin:  Epidermis. No specimens were taken. A time out was conducted at 12:30, prior to the start of the procedure. A Large amount of bleeding was controlled with Pressure. The procedure was tolerated well. Post Debridement Measurements: 11cm length x 10cm width x 1.5cm depth; 129.591cm^3 volume. Character of Wound/Ulcer Post Debridement requires further debridement. Post procedure Diagnosis Wound #3: Same as Pre-Procedure EVALYNE, CORTOPASSI ([id number]) Plan Follow-up Appointments: Return Appointment in 1 week. Home Health: [facility]: - [facility] for wound care. May utilize formulary equivalent dressing for wound treatment orders unless otherwise specified. Home Health Nurse may visit [facility] to address patient s wound care needs. Scheduled days for dressing changes to be completed; exception, patient has scheduled wound care visit that day. **Please direct any NON-WOUND related issues/requests for orders to patient's Primary Care Physician. **If current dressing causes regression in wound condition, may D/C ordered dressing product/s and apply Normal Saline Moist Dressing daily until next [facility] or Other MD appointment. **Notify [facility] of regression in wound condition at [phone]. Bathing/ Shower/ Hygiene: May shower; gently cleanse wound with antibacterial soap, rinse and pat dry prior to dressing wounds - Right May shower with wound dressing protected with water repellent cover or cast protector. - Left No tub bath. Edema Control - Lymphedema / Segmental Compressive Device / Other: Tubigrip single layer applied. - Tubigrip F Elevate legs to the level of the heart and pump ankles as often as possible Elevate leg(s) parallel to the floor when sitting. Additional Orders / Instructions: Follow Nutritious Diet and Increase Protein Intake The following medication(s) was prescribed: doxycycline hyclate oral 100 mg capsule 1 1 capsule oral taken  2 times per day for 14 days. Do not take magnesium or a multivitamin with this medication starting 10/26/2021 WOUND #1: - Lower Leg Wound Laterality: Right Cleanser: Soap and Water 3 x Per Week/30 Days Discharge Instructions: Gently cleanse wound with antibacterial soap, rinse and pat dry prior to dressing wounds Primary Dressing: Hydrofera Blue Ready Transfer Foam, 2.5x2.5 (in/in) (Generic) 3 x Per Week/30 Days Discharge Instructions: Apply Hydrofera Blue Ready to wound bed as directed Secondary Dressing: Mepilex Border Flex, 4x4 (in/in) (Generic) 3 x Per Week/30 Days Discharge Instructions: Apply to wound as directed. Do not cut. Secured With: Tubigrip Size F, 4x10 (in/yd) 3 x Per Week/30 Days Discharge Instructions: Apply Tubigrip F 3 finger-widths below knee to base of toes to secure dressing and/or for swelling. WOUND #2: - Lower Leg Wound Laterality: Left Cleanser: Soap and  Water 3 x Per Week/30 Days Discharge Instructions: Gently cleanse wound with antibacterial soap, rinse and pat dry prior to dressing wounds Primary Dressing: Hydrofera Blue Ready Transfer Foam, 2.5x2.5 (in/in) (Generic) 3 x Per Week/30 Days Discharge Instructions: Apply Hydrofera Blue Ready to wound bed as directed Secondary Dressing: ABD Pad 5x9 (in/in) 3 x Per Week/30 Days Discharge Instructions: Cover with ABD pad Compression Wrap: Profore Lite LF 3 Multilayer Compression Bandaging System 3 x Per Week/30 Days Discharge Instructions: Apply 3 multi-layer wrap as prescribed. WOUND #3: - Lower Leg Wound Laterality: Right, Medial Primary Dressing: Gauze 1 x Per Day/15 Days Discharge Instructions: As directed: moistened with Dakins Solution Secured With: Kerlix Roll Sterile or Non-Sterile 6-ply 4.5x4 (yd/yd) 1 x Per Day/15 Days Discharge Instructions: Apply Kerlix as directed Compression Wrap: Tubi F 1 x Per Day/15 Days WOUND #4: - Lower Leg Wound Laterality: Left, Midline, Distal Compression Wrap: Profore Lite  LF 3 Multilayer Compression Bandaging System 3 x Per Week/15 Days Discharge Instructions: Apply 3 multi-layer wrap as prescribed. 1. Based on what I am seeing I do think this is good to be a significant setback for the patient but nonetheless I am glad she came in now as opposed to later so that we can get things moving along here. 2. I am also can recommend that we initiate a 3 layer compression wrap to try to get the small area of hematoma to clear up on the left leg. 3. On the right leg wound and using a Dakin's moistened gauze dressing to try and help clear away the hematoma remnants in any of the necrotic tissue associated. I think this to be more urgently done the me trying to debride everything which was causing quite a bit of bleeding. I also recommended that she stop taking the aspirin unless she has a reason to go back on it later she should not be on this any longer. 4. I am also Going to place the patient on doxycycline which I think will also be beneficial as far as helping with controlling her risk of infection here. The patient is in agreement with that plan. We will see patient back for reevaluation in 1 week here in the clinic. If anything worsens or changes patient will contact our office for additional recommendations. Electronic Signature(s) Signed: 10/26/2021 1:50:50 PM By: Waynard Reeds, Nykeria B. ([id number]) Entered By: Worthy Keeler on 10/26/2021 13:50:50 [name] ([id number]) -------------------------------------------------------------------------------- SuperBill Details Patient Name: Brittney [name], [name] Date of Service: 10/26/2021 Medical Record Number: [id number] Patient Account Number: [id number] Date of Birth/Sex: 24-Nov-1951 (70 y.o. 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 I10 Essential (primary) hypertension N18.2 Chronic kidney disease, stage 2 (mild) J44.9 Chronic obstructive pulmonary disease, unspecified E11.622 Type 2 diabetes mellitus with other skin ulcer Facility Procedures CPT4 Code: 64332951 Description: 88416 - WOUND CARE VISIT-LEV 2 EST PT Modifier: Quantity: 1 CPT4 Code: 60630160 Description: 10932 - DEBRIDE WOUND 1ST 20 SQ CM OR < Modifier: Quantity: 1 CPT4 Code: Description: ICD-10 Diagnosis Description 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 with  unspecifi Modifier: ed severity Quantity: CPT4 Code: 66196940 Description: 98286 - DEBRIDE WOUND EA ADDL 20 SQ CM Modifier: Quantity: 5 CPT4 Code: Description: ICD-10 Diagnosis Description 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 with unspecifi Modifier: ed severity Quantity: Physician Procedures CPT4 Code: 7519824 Description: 29980 - WC PHYS LEVEL 4 - EST PT Modifier: 25 Quantity: 1 CPT4 Code: Description: ICD-10 Diagnosis Description S80.11XA Contusion of right lower leg, initial encounter 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 with unspecifie S80.12XA Contusion of left lower leg, initial encounter L97.823  Non-pressure chronic ulcer of other part of left lower leg with necrosis of Modifier: d severity muscle Quantity: CPT4 Code: 6999672 Description: 27737 - WC PHYS DEBR WO ANESTH 20 SQ CM Modifier: Quantity: 1 CPT4 Code: Description: ICD-10 Diagnosis Description 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 with unspecifie Modifier: d  severity Quantity: CPT4 Code: 5051071 Description: 25247 - WC PHYS DEBR WO ANESTH EA ADD 20 CM Modifier: Quantity: 5 CPT4 Code: Description: ICD-10 Diagnosis Description 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 with unspecifie Modifier: d severity Quantity: Electronic Signature(s) Signed: 10/26/2021 1:51:35 PM By: Worthy Keeler PA-C Entered By: Worthy Keeler on 10/26/2021 13:51:34

## 2021-10-29 ENCOUNTER — Ambulatory Visit: Payer: Medicare Other | Admitting: Physician Assistant

## 2021-10-30 ENCOUNTER — Emergency Department
Admission: EM | Admit: 2021-10-30 | Discharge: 2021-10-30 | Disposition: A | Discharge status: Other Acute Inpatient Hospital | Payer: Medicare Other | Attending: Emergency Medicine | Admitting: Emergency Medicine

## 2021-10-30 ENCOUNTER — Emergency Department: Payer: Medicare Other

## 2021-10-30 ENCOUNTER — Other Ambulatory Visit: Payer: Self-pay

## 2021-10-30 ENCOUNTER — Encounter: Payer: Self-pay | Admitting: Emergency Medicine

## 2021-10-30 DIAGNOSIS — Z96652 Presence of left artificial knee joint: Secondary | ICD-10-CM | POA: Diagnosis not present

## 2021-10-30 DIAGNOSIS — Z23 Encounter for immunization: Secondary | ICD-10-CM | POA: Diagnosis not present

## 2021-10-30 DIAGNOSIS — Z20822 Contact with and (suspected) exposure to covid-19: Secondary | ICD-10-CM | POA: Insufficient documentation

## 2021-10-30 DIAGNOSIS — S82831A Other fracture of upper and lower end of right fibula, initial encounter for closed fracture: Secondary | ICD-10-CM | POA: Diagnosis not present

## 2021-10-30 DIAGNOSIS — S82261B Displaced segmental fracture of shaft of right tibia, initial encounter for open fracture type I or II: Secondary | ICD-10-CM

## 2021-10-30 DIAGNOSIS — W050XXA Fall from non-moving wheelchair, initial encounter: Secondary | ICD-10-CM | POA: Insufficient documentation

## 2021-10-30 DIAGNOSIS — S82261A Displaced segmental fracture of shaft of right tibia, initial encounter for closed fracture: Secondary | ICD-10-CM | POA: Diagnosis not present

## 2021-10-30 DIAGNOSIS — E1142 Type 2 diabetes mellitus with diabetic polyneuropathy: Secondary | ICD-10-CM | POA: Diagnosis not present

## 2021-10-30 DIAGNOSIS — S8991XA Unspecified injury of right lower leg, initial encounter: Secondary | ICD-10-CM | POA: Diagnosis present

## 2021-10-30 DIAGNOSIS — I130 Hypertensive heart and chronic kidney disease with heart failure and stage 1 through stage 4 chronic kidney disease, or unspecified chronic kidney disease: Secondary | ICD-10-CM | POA: Insufficient documentation

## 2021-10-30 DIAGNOSIS — S82101A Unspecified fracture of upper end of right tibia, initial encounter for closed fracture: Secondary | ICD-10-CM | POA: Insufficient documentation

## 2021-10-30 DIAGNOSIS — E1122 Type 2 diabetes mellitus with diabetic chronic kidney disease: Secondary | ICD-10-CM | POA: Diagnosis not present

## 2021-10-30 DIAGNOSIS — Z85820 Personal history of malignant melanoma of skin: Secondary | ICD-10-CM | POA: Diagnosis not present

## 2021-10-30 DIAGNOSIS — Z7982 Long term (current) use of aspirin: Secondary | ICD-10-CM | POA: Diagnosis not present

## 2021-10-30 DIAGNOSIS — N1831 Chronic kidney disease, stage 3a: Secondary | ICD-10-CM | POA: Insufficient documentation

## 2021-10-30 DIAGNOSIS — S82301A Unspecified fracture of lower end of right tibia, initial encounter for closed fracture: Secondary | ICD-10-CM | POA: Insufficient documentation

## 2021-10-30 DIAGNOSIS — E039 Hypothyroidism, unspecified: Secondary | ICD-10-CM | POA: Insufficient documentation

## 2021-10-30 DIAGNOSIS — Z79899 Other long term (current) drug therapy: Secondary | ICD-10-CM | POA: Insufficient documentation

## 2021-10-30 DIAGNOSIS — I509 Heart failure, unspecified: Secondary | ICD-10-CM | POA: Diagnosis not present

## 2021-10-30 DIAGNOSIS — Z794 Long term (current) use of insulin: Secondary | ICD-10-CM | POA: Insufficient documentation

## 2021-10-30 DIAGNOSIS — I2510 Atherosclerotic heart disease of native coronary artery without angina pectoris: Secondary | ICD-10-CM | POA: Insufficient documentation

## 2021-10-30 LAB — CBC WITH DIFFERENTIAL/PLATELET
Abs Immature Granulocytes: 0.07 10*3/uL (ref 0.00–0.07)
Basophils Absolute: 0.1 10*3/uL (ref 0.0–0.1)
Basophils Relative: 0 %
Eosinophils Absolute: 0.1 10*3/uL (ref 0.0–0.5)
Eosinophils Relative: 0 %
HCT: 36.1 % (ref 36.0–46.0)
Hemoglobin: 11.3 g/dL — ABNORMAL LOW (ref 12.0–15.0)
Immature Granulocytes: 1 %
Lymphocytes Relative: 12 %
Lymphs Abs: 1.5 10*3/uL (ref 0.7–4.0)
MCH: 28.5 pg (ref 26.0–34.0)
MCHC: 31.3 g/dL (ref 30.0–36.0)
MCV: 91.2 fL (ref 80.0–100.0)
Monocytes Absolute: 1 10*3/uL (ref 0.1–1.0)
Monocytes Relative: 8 %
Neutro Abs: 10 10*3/uL — ABNORMAL HIGH (ref 1.7–7.7)
Neutrophils Relative %: 79 %
Platelets: 235 10*3/uL (ref 150–400)
RBC: 3.96 MIL/uL (ref 3.87–5.11)
RDW: 14 % (ref 11.5–15.5)
WBC: 12.7 10*3/uL — ABNORMAL HIGH (ref 4.0–10.5)
nRBC: 0 % (ref 0.0–0.2)

## 2021-10-30 LAB — RESP PANEL BY RT-PCR (FLU A&B, COVID) ARPGX2
Influenza A by PCR: NEGATIVE
Influenza B by PCR: NEGATIVE
SARS Coronavirus 2 by RT PCR: NEGATIVE

## 2021-10-30 LAB — BASIC METABOLIC PANEL
Anion gap: 11 (ref 5–15)
BUN: 37 mg/dL — ABNORMAL HIGH (ref 8–23)
CO2: 35 mmol/L — ABNORMAL HIGH (ref 22–32)
Calcium: 10.2 mg/dL (ref 8.9–10.3)
Chloride: 90 mmol/L — ABNORMAL LOW (ref 98–111)
Creatinine, Ser: 1.29 mg/dL — ABNORMAL HIGH (ref 0.44–1.00)
GFR, Estimated: 45 mL/min — ABNORMAL LOW (ref >60)
Glucose, Bld: 214 mg/dL — ABNORMAL HIGH (ref 70–99)
Potassium: 4.6 mmol/L (ref 3.5–5.1)
Sodium: 136 mmol/L (ref 135–145)

## 2021-10-30 LAB — PROTIME-INR
INR: 1 (ref 0.8–1.2)
Prothrombin Time: 13.2 seconds (ref 11.4–15.2)

## 2021-10-30 MED ORDER — MORPHINE SULFATE (PF) 4 MG/ML IV SOLN
6.0000 mg | Freq: Once | INTRAVENOUS | Status: AC
  Administered 2021-10-30: 6 mg via INTRAVENOUS
  Filled 2021-10-30: qty 2

## 2021-10-30 MED ORDER — ONDANSETRON HCL 4 MG/2ML IJ SOLN
4.0000 mg | Freq: Once | INTRAMUSCULAR | Status: AC
  Administered 2021-10-30: 4 mg via INTRAVENOUS
  Filled 2021-10-30: qty 2

## 2021-10-30 MED ORDER — SODIUM CHLORIDE 0.9 % IV SOLN
1.0000 g | Freq: Once | INTRAVENOUS | Status: AC
  Administered 2021-10-30: 1 g via INTRAVENOUS
  Filled 2021-10-30: qty 10

## 2021-10-30 MED ORDER — CEFAZOLIN SODIUM-DEXTROSE 2-4 GM/100ML-% IV SOLN
2.0000 g | Freq: Once | INTRAVENOUS | Status: AC
  Administered 2021-10-30: 2 g via INTRAVENOUS
  Filled 2021-10-30: qty 100

## 2021-10-30 MED ORDER — TETANUS-DIPHTH-ACELL PERTUSSIS 5-2.5-18.5 LF-MCG/0.5 IM SUSY
0.5000 mL | PREFILLED_SYRINGE | Freq: Once | INTRAMUSCULAR | Status: AC
  Administered 2021-10-30: 0.5 mL via INTRAMUSCULAR
  Filled 2021-10-30: qty 0.5

## 2021-10-30 MED ORDER — FENTANYL CITRATE PF 50 MCG/ML IJ SOSY
50.0000 ug | PREFILLED_SYRINGE | Freq: Once | INTRAMUSCULAR | Status: AC
  Administered 2021-10-30: 50 ug via INTRAVENOUS
  Filled 2021-10-30: qty 1

## 2021-10-30 NOTE — ED Notes (Signed)
XRAY  POWERSHARE  WITH  [facility] 

## 2021-10-30 NOTE — ED Triage Notes (Signed)
Patient to ED via [facility] from home for fall. Patient was trying to transfer from motorized wheelchair to a different chair when her leg gave out. Shortening noted to right leg with a large, deep laceration noted to the lower leg. Patient alert and oriented.

## 2021-10-30 NOTE — ED Notes (Signed)
[facility]  CALLED  PER DR  Cherylann Banas  MD

## 2021-10-30 NOTE — ED Notes (Signed)
Patient to xray at this time

## 2021-10-30 NOTE — ED Notes (Signed)
[facility]  CALLED  PER  DR  Cherylann Banas MD

## 2021-10-30 NOTE — ED Provider Notes (Signed)
[facility] Emergency Department Provider Note ____________________________________________   Event Date/Time   First MD Initiated Contact with Patient 10/30/21 1037     (approximate)  I have reviewed the triage vital signs and the nursing notes.   HISTORY  Chief Complaint Fall    HPI Brittney [name] is a 70 y.o. female with PMH as noted below including diabetes, chronic kidney disease, peripheral vascular disease, hypertension, and CHF who presents with a right lower leg injury, acute onset this morning when the patient states she fell while transferring from her wheelchair to another chair.  The patient states that her leg gave out and she fell onto it.  She denies hitting her head.  She denies any back pain or other injuries besides the right leg.  Past Medical History:  Diagnosis Date   Abdominal pain, left upper quadrant 12/11/2012   Anemia    Arthritis    Asthma    Broken leg 2014   fell twice and broke same leg. had a bone stimulator for first break.   Cancer ([place]) 2007   melanoma, left ear and back of left leg, removed from back   Chronic kidney disease, stage 2 (mild)    Chronic lower back pain    Collagen vascular disease (HCC)    Complication of anesthesia    slow to wake, coughing   Coronary artery disease    Diabetes mellitus without complication ([place])    Diabetic nephropathy associated with secondary diabetes mellitus ([place])    Flushing 12/11/2012   Gastroparesis    Gross hematuria 12/11/2012   Hepatic cirrhosis ([place])    History of kidney stones    Hyperlipidemia    Hypertension    Hypothyroidism    Hypothyroidism    IBS (irritable bowel syndrome)    Kidney stone 12/11/2012   Lower extremity edema    Morbid obesity with BMI of 40.0-44.9, adult (HCC)    Motion sickness    back seat of cars   MRSA (methicillin resistant staph aureus) culture positive 2016   Myocardial infarction ([place]) 09/2013   Nausea without vomiting  12/11/2012   Nephrolithiasis    Nephrolithiasis 04/26/2014   Nonproliferative retinopathy due to secondary diabetes (HCC)    Numbness and tingling of right leg    Peripheral vascular disease ([place])    Renal colic 65/78/4696   Renal insufficiency    Sciatica    Sciatica 12/11/2012   Skin cancer 08/2018   Sleep apnea    USES CPAP   Spinal cord stimulator status    battery in left buttock   Stroke ([place]) 07/2020   Unilateral small kidney without contralateral hypertrophy    Wears dentures    full lower   Wheelchair dependent    reports able to transfer with assist    Patient Active Problem List   Diagnosis Date Noted   Cellulitis of right leg 07/09/2021   Left leg cellulitis 07/08/2021   CHF (congestive heart failure) ([place]) 07/07/2021   Chronic kidney disease, stage 3a ([place]) 07/07/2021   Left fibular fracture 07/07/2021   Bilateral lower leg cellulitis 08/18/2020   Rhabdomyolysis 08/18/2020   Fall at home, initial encounter 08/18/2020   Fall 08/18/2020   Primary osteoarthritis of left knee 11/26/2019   Sepsis ([place]) 07/30/2019   Type 2 diabetes mellitus with diabetic polyneuropathy, with long-term current use of insulin ([place]) 07/03/2019   OSA on CPAP 01/24/2019   Lymphedema 09/03/2018   Venous stasis of both lower extremities 09/03/2018  Status post lumbar spine surgery for decompression of spinal cord 03/30/2018   Myofascial pain 03/30/2018   Spinal stenosis of thoracic region 03/30/2018   Back pain 11/02/2017   Iron deficiency anemia 05/10/2017   Benign essential hypertension 03/10/2017   Long term prescription benzodiazepine use 01/18/2017   Vitamin D insufficiency 01/18/2017   Neurogenic pain 01/17/2017   L3-4 severe lumbar facet hypertrophy and spinal stenosis 01/10/2017   Diabetes with retinopathy ([place]) 12/07/2016   Diabetic nephropathy ([place]) 12/07/2016   Gastroparesis due to DM ([place]) 12/07/2016   Long term current use of opiate analgesic 12/07/2016   Long term  prescription opiate use 12/07/2016   Opiate use 12/07/2016   Chronic pain syndrome 12/07/2016   Chronic low back pain (Location of Primary Source of Pain) (Bilateral) (R>L) 12/07/2016   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/07/2016   Chronic lower extremity pain (Location of Secondary source of pain) (Bilateral) (R>L) 12/07/2016   Chronic knee pain (Location of Tertiary source of pain) (Right) 12/07/2016   Osteoarthritis of knee (Right) 12/07/2016   Grade 1 Anterolisthesis of L3 over L4 and L4 over L5 12/07/2016   Osteoarthritis of sacroiliac joint (Right) 12/07/2016   Generalized osteoarthritis of multiple sites 10/22/2016   Degenerative spondylolisthesis 09/08/2016   L3-4 severe lumbar spinal stenosis (12/31/2016 MRI) 09/08/2016   Lung nodule, multiple 08/31/2016   Hepatic cirrhosis ([place]) 06/20/2016   Fibromyalgia 04/15/2016   Seronegative arthritis 04/15/2016   Diabetes mellitus with peripheral vascular disease ([place]) 03/16/2016   Type 2 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, with long-term current use of insulin ([place]) 03/16/2016   Morbid obesity with BMI of 50.0-59.9, adult ([place]) 03/16/2016   Type 2 diabetes mellitus with diabetic nephropathy, with long-term current use of insulin ([place]) 03/16/2016   Microalbuminuria 02/20/2016   Essential hypertension 01/27/2016   DM (diabetes mellitus) type II uncontrolled, periph vascular disorder 04/26/2014   Hyperlipemia 04/26/2014   OSA (obstructive sleep apnea) 04/26/2014   Abnormal tumor markers 12/11/2012   Chronic kidney disease, stage II (mild) 12/11/2012   Unilateral small kidney 12/11/2012    Past Surgical History:  Procedure Laterality Date   ABDOMINAL HYSTERECTOMY     BACK SURGERY  04/1999   L-4-5 LAMINECTOMY. metal in lower back and neck   CATARACT EXTRACTION W/PHACO Right 09/17/2021   Procedure: CATARACT EXTRACTION PHACO AND INTRAOCULAR LENS PLACEMENT ([place])  RIGHT DIABETIC;  Surgeon: Leandrew Koyanagi, MD;  Location: [facility] ORS;  Service: Ophthalmology;  Laterality: Right;  5.64 0:58.9   Pray     COLONOSCOPY  05/04/2001   COLONOSCOPY WITH PROPOFOL N/A 09/20/2016   Procedure: COLONOSCOPY WITH PROPOFOL;  Surgeon: Lollie Sails, MD;  Location: [facility] ENDOSCOPY;  Service: Endoscopy;  Laterality: N/A;   DILATION AND CURETTAGE OF UTERUS     ESOPHAGOGASTRODUODENOSCOPY  02/08/2014   ESOPHAGOGASTRODUODENOSCOPY (EGD) WITH PROPOFOL N/A 09/20/2016   Procedure: ESOPHAGOGASTRODUODENOSCOPY (EGD) WITH PROPOFOL;  Surgeon: Lollie Sails, MD;  Location[facility] ENDOSCOPY;  Service: Endoscopy;  Laterality: N/A;   ESOPHAGOGASTRODUODENOSCOPY (EGD) WITH PROPOFOL N/A 05/23/2018   Procedure: ESOPHAGOGASTRODUODENOSCOPY (EGD) WITH PROPOFOL;  Surgeon: Lollie Sails, MD;  Location: [facility] ENDOSCOPY;  Service: Endoscopy;  Laterality: N/A;   ESOPHAGOGASTRODUODENOSCOPY (EGD) WITH PROPOFOL N/A 07/18/2020   Procedure: ESOPHAGOGASTRODUODENOSCOPY (EGD) WITH PROPOFOL;  Surgeon: Lesly Rubenstein, MD;  Location: [facility] ENDOSCOPY;  Service: Gastroenterology;  Laterality: N/A;   EYE SURGERY  1986   FOR MELANOMA   HERNIA REPAIR  JOINT REPLACEMENT Left 09/24/2013   TOTAL KNEE   MOHS SURGERY  11/2016   left side of nose   PULSE GENERATOR IMPLANT N/A 11/02/2017   Procedure: UNILATERAL PULSE GENERATOR IMPLANT;  Surgeon: Meade Maw, MD;  Location: [facility] ORS;  Service: Neurosurgery;  Laterality: N/A;   TONSILLECTOMY     WITH ADENOIDECTOMY    Prior to Admission medications   Medication Sig Start Date End Date Taking? Authorizing Provider  albuterol (VENTOLIN HFA) 108 (90 Base) MCG/ACT inhaler Inhale 2 puffs into the lungs 3 (three) times daily as needed for wheezing or shortness of breath.     [provider]  ALPRAZolam Duanne Moron) 0.5 MG tablet Take 0.5 mg by mouth 3 (three) times daily.    [provider]  aspirin EC 81  MG tablet Take 81 mg by mouth daily. Swallow whole.    [provider]  budesonide (PULMICORT) 0.5 MG/2ML nebulizer solution Take 2 mLs by nebulization daily as needed (Chronic bronchitis). 07/25/20   [provider]  carvedilol (COREG) 25 MG tablet Take 50 mg by mouth 2 (two) times daily with a meal.    [provider]  Cholecalciferol (VITAMIN D-3) 125 MCG (5000 UT) TABS Take 5,000 Units by mouth daily at 8 pm.    [provider]  cloNIDine (CATAPRES - DOSED IN MG/24 HR) 0.3 mg/24hr patch Place 0.3 mg onto the skin once a week.     [provider]  cyclobenzaprine (FLEXERIL) 10 MG tablet Take 20 mg by mouth at bedtime. 12/19/19   [provider]  diclofenac Sodium (VOLTAREN) 1 % GEL Apply 2 g topically daily as needed (pain).    [provider]  escitalopram (LEXAPRO) 10 MG tablet Take 10 mg by mouth daily.  12/13/12   [provider]  fluticasone (FLONASE) 50 MCG/ACT nasal spray Place 2 sprays into both nostrils daily as needed for allergies.    [provider]  furosemide (LASIX) 40 MG tablet Take 40 mg by mouth daily as needed for fluid or edema. 08/14/20   [provider]  gabapentin (NEURONTIN) 600 MG tablet Take 1 tablet (600mg ) by mouth twice daily and take 3 tablets (1800mg ) by mouth at bedtime Patient taking differently: Take 2,400 mg by mouth at bedtime. 08/21/20   Mercy Riding, MD  hydrALAZINE (APRESOLINE) 50 MG tablet Take 50 mg by mouth 2 (two) times daily. 08/01/21   [provider]  HYSEPT 0.25 % SOLN Apply 1 application topically daily. 08/06/21   [provider]  ibuprofen (ADVIL) 800 MG tablet Take 800 mg by mouth daily.    [provider]  Insulin Human (INSULIN PUMP) SOLN Inject into the skin continuous. Humulin R 100    [provider]  ipratropium-albuterol (DUONEB) 0.5-2.5 (3) MG/3ML SOLN Take 3 mLs by nebulization every 6 (six) hours as needed  (bronchitis). 07/25/20   [provider]  KLOR-CON M10 10 MEQ tablet Take 10 mEq by mouth daily. 06/09/21   [provider]  levothyroxine (SYNTHROID) 200 MCG tablet Take 200 mcg by mouth daily before breakfast.    [provider]  linaclotide (LINZESS) 145 MCG CAPS capsule Take 145 mcg by mouth daily as needed (Constipation).    [provider]  magnesium oxide (MAG-OX) 400 MG tablet Take 400 mg by mouth daily.    [provider]  metolazone (ZAROXOLYN) 5 MG tablet Take 5 mg by mouth 2 (two) times a week. 01/24/20 07/07/21  [provider]  ondansetron (ZOFRAN) 4 MG tablet Take 4 mg by mouth 2 (two) times daily. 05/14/19   [provider]  oxyCODONE-acetaminophen (PERCOCET) 10-325 MG tablet Take 1 tablet by mouth every 12 (twelve) hours as needed for pain. Must last 30 days. 10/15/21 11/14/21  [name], MD  oxyCODONE-acetaminophen (PERCOCET) 10-325 MG tablet Take 1 tablet by mouth every 12 (twelve) hours as needed for pain. Must last 30 days. 11/14/21 12/14/21  [name], MD  oxyCODONE-acetaminophen (PERCOCET) 10-325 MG tablet Take 1 tablet by mouth every 12 (twelve) hours as needed for pain. Must last 30 days. 12/14/21 01/13/22  [name], MD  pantoprazole (PROTONIX) 40 MG tablet Take 40 mg by mouth 2 (two) times daily.     [provider]  pramipexole (MIRAPEX) 0.5 MG tablet Take 1 mg by mouth at bedtime.    [provider]  telmisartan (MICARDIS) 40 MG tablet Take 40 mg by mouth daily. 08/14/20   [provider]  traMADol (ULTRAM) 50 MG tablet Take 100 mg by mouth at bedtime.    [provider]  traZODone (DESYREL) 150 MG tablet Take 150 mg by mouth at bedtime.  12/11/12   [provider]  vitamin C (ASCORBIC ACID) 500 MG tablet Take 500 mg by mouth daily.    [provider]    Allergies Shellfish allergy, Cephalexin, Amlodipine, Codeine, Imdur [isosorbide dinitrate],  Lyrica [pregabalin], Monosodium glutamate, and Tape  Family History  Problem Relation Age of Onset   Heart disease Mother    Cancer Mother    Asthma Mother    Diabetes Father    Kidney disease Father    Hypertension Father    Breast cancer Neg Hx     Social History Social History   Tobacco Use   Smoking status: Never   Smokeless tobacco: Never  Vaping Use   Vaping Use: Never used  Substance Use Topics   Alcohol use: No   Drug use: Never    Review of Systems  Constitutional: No fever. Eyes: No redness. ENT: No sore throat. Cardiovascular: Denies chest pain. Respiratory: Denies shortness of breath. Gastrointestinal: No vomiting or diarrhea.  Genitourinary: Negative for dysuria.  Musculoskeletal: Negative for back pain.  Positive for right lower leg pain. Skin: Negative for rash. Neurological: Negative for headache.   ____________________________________________   PHYSICAL EXAM:  VITAL SIGNS: ED Triage Vitals  Enc Vitals Group     BP 10/30/21 1035 (!) 142/62     Pulse Rate 10/30/21 1035 77     Resp 10/30/21 1035 18     Temp 10/30/21 1035 99 F (37.2 C)     Temp Source 10/30/21 1035 Oral     SpO2 10/30/21 1031 97 %     Weight 10/30/21 1036 (!) 309 lb (140.2 kg)     Height 10/30/21 1036 5\' 6"  (1.676 m)     Head Circumference --      Peak Flow --      Pain Score 10/30/21 1036 10     Pain Loc --      Pain Edu? --      Excl. in [place]? --     Constitutional: Alert and oriented.  Uncomfortable appearing but in no acute distress. Eyes: Conjunctivae are normal.  Head: Atraumatic. Nose: No congestion/rhinnorhea. Mouth/Throat: Mucous membranes are moist.   Neck: Normal range of motion.  Cardiovascular: Normal rate, regular rhythm. Good peripheral circulation. Respiratory: Normal respiratory effort.  No retractions. Gastrointestinal: No distention.  Musculoskeletal: 2+ bilateral lower extremity  edema.  Extremities warm and well perfused.  Right lower leg with  several chronic appearing skin ulcers.  Large approximately 10 cm diagonal wound just above the ankle with visible subcutaneous fat and muscle.  Deformity and shortening of the lower leg just above the ankle.  No tenderness or deformity to the knee or hip. Neurologic:  Normal speech and language. No gross focal neurologic deficits are appreciated.  Intact motor and sensory to the distal right lower leg. Skin:  Skin is warm and dry. No rash noted. Psychiatric: Mood and affect are normal. Speech and behavior are normal.  ____________________________________________   LABS (all labs ordered are listed, but only abnormal results are displayed)  Labs Reviewed  BASIC METABOLIC PANEL - Abnormal; Notable for the following components:      Result Value   Chloride 90 (*)    CO2 35 (*)    Glucose, Bld 214 (*)    BUN 37 (*)    Creatinine, Ser 1.29 (*)    GFR, Estimated 45 (*)    All other components within normal limits  CBC WITH DIFFERENTIAL/PLATELET - Abnormal; Notable for the following components:   WBC 12.7 (*)    Hemoglobin 11.3 (*)    Neutro Abs 10.0 (*)    All other components within normal limits  RESP PANEL BY RT-PCR (FLU A&B, COVID) [id number]  PROTIME-INR  TYPE AND SCREEN   ____________________________________________  EKG  ED ECG REPORT I, Arta Silence, the attending physician, personally viewed and interpreted this ECG.  Date: 10/30/2021 EKG Time: 1052 Rate: 76 Rhythm: normal sinus rhythm QRS Axis: normal Intervals: Prolonged PR ST/T Wave abnormalities: Nonspecific T wave abnormalities Narrative Interpretation: no evidence of acute ischemia  ____________________________________________  RADIOLOGY  XR R tib/fib: IMPRESSION:  Displaced fractures proximal tibia and distal tibia unchanged in  position post splinting.   XR R femur:  IMPRESSION:  1. Partially visible comminuted fractures of the proximal right  tibia and fibula.  2. Right femur is intact.  Chronic  right knee joint degeneration.   XR R tib/fib post splinting: IMPRESSION:  Displaced fractures proximal tibia and distal tibia unchanged in  position post splinting.   ____________________________________________   PROCEDURES  Procedure(s) performed: No  Procedures  Critical Care performed: No ____________________________________________   INITIAL IMPRESSION / ASSESSMENT AND PLAN / ED COURSE  Pertinent labs & imaging results that were available during my care of the patient were reviewed by me and considered in my medical decision making (see chart for details).   70 year old female with extensive PMH as noted above presents with a right lower leg injury after mechanical fall while transferring from her wheelchair to another chair.  Exam reveals a large and deep appearing laceration to the right leg above the ankle, with a deformity in the same area as well as chronic appearing lower extremity edema and existing wounds which the patient is followed for.  I reviewed the past medical records in epic.  The patient is followed by wound care.  She was most recently admitted in July with a left ankle fracture, and infected right lower leg venous stasis ulcer, and worsened edema.  Presentation is concerning for a compound fracture.  At this time the right lower extremity appears neuro/vascular intact.  We will obtain x-rays, give analgesia, and consult orthopedics.  ----------------------------------------- 12:42 PM on 10/30/2021 -----------------------------------------  X-rays confirm a segmental compound tibial fracture.  I consulted Dr. Sabra Heck from orthopedics who recommends that a trauma orthopedist should manage the patient  and that the patient should be transferred to a tertiary center.  I then discussed the case with Dr. Lynann Bologna from orthopedics at [facility] who recommended that the patient be transferred ED to ED.  I then asked the transfer center to contact the ED physician,  however I was subsequently informed that there are no ED or inpatient beds and the patient cannot be accepted.  I am now awaiting callback from the [facility] transfer center.   ----------------------------------------- 1:49 PM on 10/30/2021 -----------------------------------------  I discussed the patient with the orthopedist on-call at [facility] who agreed to evaluate her and recommended an ED to ED transfer.  They recommended that we give the patient Ancef and update her tetanus.  The accepting physician is Dr. Cherene Altes.  The patient is stable for transfer at this time.  The leg has been splinted.   ____________________________________________   FINAL CLINICAL IMPRESSION(S) / ED DIAGNOSES  Final diagnoses:  Type I or II open displaced segmental fracture of shaft of right tibia, initial encounter      NEW MEDICATIONS STARTED DURING THIS VISIT:  Discharge Medication List as of 10/30/2021  3:53 PM       Note:  This document was prepared using Dragon voice recognition software and may include unintentional dictation errors.    Arta Silence, MD 10/30/21 Jeri Lager

## 2021-10-30 NOTE — Progress Notes (Signed)
Patient ID: Brittney [name], female   DOB: 25-Nov-1951, 70 y.o.   MRN: [id number]  Orthopedics aware of patient, plan IMN tibia on Sunday by Dr. Percell Miller. Please keep NPO after MN Saturday.    Lisette Abu, PA-C Orthopedic Surgery [phone]

## 2021-11-01 ENCOUNTER — Ambulatory Visit: Admit: 2021-11-01 | Payer: Medicare Other | Admitting: Orthopedic Surgery

## 2021-11-01 SURGERY — INSERTION, INTRAMEDULLARY ROD, TIBIA
Anesthesia: General | Laterality: Right

## 2021-11-02 ENCOUNTER — Ambulatory Visit: Payer: Medicare Other | Admitting: Physician Assistant

## 2021-11-02 LAB — GLUCOSE, CAPILLARY: Glucose-Capillary: 214 mg/dL — ABNORMAL HIGH (ref 70–99)

## 2021-11-04 LAB — TYPE AND SCREEN
ABO/RH(D): A POS
Antibody Screen: NEGATIVE

## 2021-11-26 ENCOUNTER — Other Ambulatory Visit: Payer: Self-pay

## 2021-11-26 ENCOUNTER — Encounter: Payer: Self-pay | Admitting: Internal Medicine

## 2021-11-26 ENCOUNTER — Emergency Department: Payer: Medicare Other

## 2021-11-26 ENCOUNTER — Inpatient Hospital Stay
Admission: EM | Admit: 2021-11-26 | Discharge: 2021-11-28 | DRG: 872 | Disposition: A | Discharge status: Other Acute Inpatient Hospital | Payer: Medicare Other | Attending: Student | Admitting: Student

## 2021-11-26 DIAGNOSIS — L0390 Cellulitis, unspecified: Secondary | ICD-10-CM | POA: Diagnosis not present

## 2021-11-26 DIAGNOSIS — Z8614 Personal history of Methicillin resistant Staphylococcus aureus infection: Secondary | ICD-10-CM

## 2021-11-26 DIAGNOSIS — I890 Lymphedema, not elsewhere classified: Secondary | ICD-10-CM

## 2021-11-26 DIAGNOSIS — R296 Repeated falls: Secondary | ICD-10-CM | POA: Diagnosis present

## 2021-11-26 DIAGNOSIS — Z7951 Long term (current) use of inhaled steroids: Secondary | ICD-10-CM

## 2021-11-26 DIAGNOSIS — G894 Chronic pain syndrome: Secondary | ICD-10-CM | POA: Diagnosis present

## 2021-11-26 DIAGNOSIS — D631 Anemia in chronic kidney disease: Secondary | ICD-10-CM | POA: Diagnosis present

## 2021-11-26 DIAGNOSIS — Z888 Allergy status to other drugs, medicaments and biological substances status: Secondary | ICD-10-CM

## 2021-11-26 DIAGNOSIS — M1380 Other specified arthritis, unspecified site: Secondary | ICD-10-CM | POA: Diagnosis present

## 2021-11-26 DIAGNOSIS — Z90710 Acquired absence of both cervix and uterus: Secondary | ICD-10-CM

## 2021-11-26 DIAGNOSIS — M158 Other polyosteoarthritis: Secondary | ICD-10-CM | POA: Diagnosis present

## 2021-11-26 DIAGNOSIS — Z91013 Allergy to seafood: Secondary | ICD-10-CM

## 2021-11-26 DIAGNOSIS — A419 Sepsis, unspecified organism: Secondary | ICD-10-CM | POA: Diagnosis not present

## 2021-11-26 DIAGNOSIS — I252 Old myocardial infarction: Secondary | ICD-10-CM

## 2021-11-26 DIAGNOSIS — K3184 Gastroparesis: Secondary | ICD-10-CM | POA: Diagnosis present

## 2021-11-26 DIAGNOSIS — Z9049 Acquired absence of other specified parts of digestive tract: Secondary | ICD-10-CM

## 2021-11-26 DIAGNOSIS — E871 Hypo-osmolality and hyponatremia: Secondary | ICD-10-CM | POA: Diagnosis present

## 2021-11-26 DIAGNOSIS — W19XXXD Unspecified fall, subsequent encounter: Secondary | ICD-10-CM

## 2021-11-26 DIAGNOSIS — Z9889 Other specified postprocedural states: Secondary | ICD-10-CM

## 2021-11-26 DIAGNOSIS — I10 Essential (primary) hypertension: Secondary | ICD-10-CM | POA: Diagnosis not present

## 2021-11-26 DIAGNOSIS — M4804 Spinal stenosis, thoracic region: Secondary | ICD-10-CM | POA: Diagnosis present

## 2021-11-26 DIAGNOSIS — E1165 Type 2 diabetes mellitus with hyperglycemia: Secondary | ICD-10-CM | POA: Diagnosis present

## 2021-11-26 DIAGNOSIS — M810 Age-related osteoporosis without current pathological fracture: Secondary | ICD-10-CM | POA: Diagnosis present

## 2021-11-26 DIAGNOSIS — Z993 Dependence on wheelchair: Secondary | ICD-10-CM

## 2021-11-26 DIAGNOSIS — E113299 Type 2 diabetes mellitus with mild nonproliferative diabetic retinopathy without macular edema, unspecified eye: Secondary | ICD-10-CM | POA: Diagnosis present

## 2021-11-26 DIAGNOSIS — F419 Anxiety disorder, unspecified: Secondary | ICD-10-CM | POA: Diagnosis present

## 2021-11-26 DIAGNOSIS — Z841 Family history of disorders of kidney and ureter: Secondary | ICD-10-CM

## 2021-11-26 DIAGNOSIS — K219 Gastro-esophageal reflux disease without esophagitis: Secondary | ICD-10-CM | POA: Diagnosis present

## 2021-11-26 DIAGNOSIS — I2510 Atherosclerotic heart disease of native coronary artery without angina pectoris: Secondary | ICD-10-CM | POA: Diagnosis present

## 2021-11-26 DIAGNOSIS — G4700 Insomnia, unspecified: Secondary | ICD-10-CM | POA: Diagnosis present

## 2021-11-26 DIAGNOSIS — Z9641 Presence of insulin pump (external) (internal): Secondary | ICD-10-CM | POA: Diagnosis present

## 2021-11-26 DIAGNOSIS — E785 Hyperlipidemia, unspecified: Secondary | ICD-10-CM | POA: Diagnosis present

## 2021-11-26 DIAGNOSIS — Z833 Family history of diabetes mellitus: Secondary | ICD-10-CM

## 2021-11-26 DIAGNOSIS — Z885 Allergy status to narcotic agent status: Secondary | ICD-10-CM

## 2021-11-26 DIAGNOSIS — E11649 Type 2 diabetes mellitus with hypoglycemia without coma: Secondary | ICD-10-CM

## 2021-11-26 DIAGNOSIS — Z20822 Contact with and (suspected) exposure to covid-19: Secondary | ICD-10-CM | POA: Diagnosis present

## 2021-11-26 DIAGNOSIS — Z825 Family history of asthma and other chronic lower respiratory diseases: Secondary | ICD-10-CM

## 2021-11-26 DIAGNOSIS — M159 Polyosteoarthritis, unspecified: Secondary | ICD-10-CM | POA: Diagnosis present

## 2021-11-26 DIAGNOSIS — I700 Atherosclerosis of aorta: Secondary | ICD-10-CM | POA: Diagnosis present

## 2021-11-26 DIAGNOSIS — Z794 Long term (current) use of insulin: Secondary | ICD-10-CM

## 2021-11-26 DIAGNOSIS — I1310 Hypertensive heart and chronic kidney disease without heart failure, with stage 1 through stage 4 chronic kidney disease, or unspecified chronic kidney disease: Secondary | ICD-10-CM | POA: Diagnosis present

## 2021-11-26 DIAGNOSIS — Z981 Arthrodesis status: Secondary | ICD-10-CM

## 2021-11-26 DIAGNOSIS — E11319 Type 2 diabetes mellitus with unspecified diabetic retinopathy without macular edema: Secondary | ICD-10-CM | POA: Diagnosis present

## 2021-11-26 DIAGNOSIS — K7460 Unspecified cirrhosis of liver: Secondary | ICD-10-CM | POA: Diagnosis present

## 2021-11-26 DIAGNOSIS — N270 Small kidney, unilateral: Secondary | ICD-10-CM | POA: Diagnosis present

## 2021-11-26 DIAGNOSIS — F32A Depression, unspecified: Secondary | ICD-10-CM | POA: Diagnosis present

## 2021-11-26 DIAGNOSIS — Z28310 Unvaccinated for covid-19: Secondary | ICD-10-CM

## 2021-11-26 DIAGNOSIS — L03115 Cellulitis of right lower limb: Secondary | ICD-10-CM | POA: Diagnosis not present

## 2021-11-26 DIAGNOSIS — N182 Chronic kidney disease, stage 2 (mild): Secondary | ICD-10-CM | POA: Diagnosis present

## 2021-11-26 DIAGNOSIS — K5903 Drug induced constipation: Secondary | ICD-10-CM | POA: Diagnosis present

## 2021-11-26 DIAGNOSIS — K529 Noninfective gastroenteritis and colitis, unspecified: Secondary | ICD-10-CM | POA: Diagnosis present

## 2021-11-26 DIAGNOSIS — Z91048 Other nonmedicinal substance allergy status: Secondary | ICD-10-CM

## 2021-11-26 DIAGNOSIS — E782 Mixed hyperlipidemia: Secondary | ICD-10-CM

## 2021-11-26 DIAGNOSIS — Z8673 Personal history of transient ischemic attack (TIA), and cerebral infarction without residual deficits: Secondary | ICD-10-CM

## 2021-11-26 DIAGNOSIS — E1122 Type 2 diabetes mellitus with diabetic chronic kidney disease: Secondary | ICD-10-CM | POA: Diagnosis present

## 2021-11-26 DIAGNOSIS — Z9181 History of falling: Secondary | ICD-10-CM

## 2021-11-26 DIAGNOSIS — Z6841 Body Mass Index (BMI) 40.0 and over, adult: Secondary | ICD-10-CM

## 2021-11-26 DIAGNOSIS — E1143 Type 2 diabetes mellitus with diabetic autonomic (poly)neuropathy: Secondary | ICD-10-CM | POA: Diagnosis present

## 2021-11-26 DIAGNOSIS — F1190 Opioid use, unspecified, uncomplicated: Secondary | ICD-10-CM | POA: Diagnosis present

## 2021-11-26 DIAGNOSIS — I878 Other specified disorders of veins: Secondary | ICD-10-CM | POA: Diagnosis present

## 2021-11-26 DIAGNOSIS — E039 Hypothyroidism, unspecified: Secondary | ICD-10-CM | POA: Diagnosis present

## 2021-11-26 DIAGNOSIS — Z85828 Personal history of other malignant neoplasm of skin: Secondary | ICD-10-CM

## 2021-11-26 DIAGNOSIS — Z79890 Hormone replacement therapy (postmenopausal): Secondary | ICD-10-CM

## 2021-11-26 DIAGNOSIS — Z79891 Long term (current) use of opiate analgesic: Secondary | ICD-10-CM

## 2021-11-26 DIAGNOSIS — D509 Iron deficiency anemia, unspecified: Secondary | ICD-10-CM | POA: Diagnosis present

## 2021-11-26 DIAGNOSIS — E1142 Type 2 diabetes mellitus with diabetic polyneuropathy: Secondary | ICD-10-CM | POA: Diagnosis present

## 2021-11-26 DIAGNOSIS — W19XXXA Unspecified fall, initial encounter: Secondary | ICD-10-CM | POA: Diagnosis present

## 2021-11-26 DIAGNOSIS — G4733 Obstructive sleep apnea (adult) (pediatric): Secondary | ICD-10-CM | POA: Diagnosis present

## 2021-11-26 DIAGNOSIS — Z79899 Other long term (current) drug therapy: Secondary | ICD-10-CM

## 2021-11-26 DIAGNOSIS — E1151 Type 2 diabetes mellitus with diabetic peripheral angiopathy without gangrene: Secondary | ICD-10-CM | POA: Diagnosis present

## 2021-11-26 DIAGNOSIS — Z96652 Presence of left artificial knee joint: Secondary | ICD-10-CM | POA: Diagnosis present

## 2021-11-26 DIAGNOSIS — T402X5A Adverse effect of other opioids, initial encounter: Secondary | ICD-10-CM | POA: Diagnosis present

## 2021-11-26 DIAGNOSIS — Z8249 Family history of ischemic heart disease and other diseases of the circulatory system: Secondary | ICD-10-CM

## 2021-11-26 DIAGNOSIS — Z7982 Long term (current) use of aspirin: Secondary | ICD-10-CM

## 2021-11-26 LAB — BASIC METABOLIC PANEL
Anion gap: 5 (ref 5–15)
BUN: 15 mg/dL (ref 8–23)
CO2: 33 mmol/L — ABNORMAL HIGH (ref 22–32)
Calcium: 11.4 mg/dL — ABNORMAL HIGH (ref 8.9–10.3)
Chloride: 92 mmol/L — ABNORMAL LOW (ref 98–111)
Creatinine, Ser: 0.75 mg/dL (ref 0.44–1.00)
GFR, Estimated: >60 mL/min (ref >60)
Glucose, Bld: 274 mg/dL — ABNORMAL HIGH (ref 70–99)
Potassium: 3.9 mmol/L (ref 3.5–5.1)
Sodium: 130 mmol/L — ABNORMAL LOW (ref 135–145)

## 2021-11-26 LAB — CBC WITH DIFFERENTIAL/PLATELET
Abs Immature Granulocytes: 0.05 10*3/uL (ref 0.00–0.07)
Basophils Absolute: 0 10*3/uL (ref 0.0–0.1)
Basophils Relative: 0 %
Eosinophils Absolute: 0 10*3/uL (ref 0.0–0.5)
Eosinophils Relative: 0 %
HCT: 28.8 % — ABNORMAL LOW (ref 36.0–46.0)
Hemoglobin: 8.9 g/dL — ABNORMAL LOW (ref 12.0–15.0)
Immature Granulocytes: 1 %
Lymphocytes Relative: 9 %
Lymphs Abs: 0.8 10*3/uL (ref 0.7–4.0)
MCH: 27.6 pg (ref 26.0–34.0)
MCHC: 30.9 g/dL (ref 30.0–36.0)
MCV: 89.2 fL (ref 80.0–100.0)
Monocytes Absolute: 0.8 10*3/uL (ref 0.1–1.0)
Monocytes Relative: 9 %
Neutro Abs: 7.6 10*3/uL (ref 1.7–7.7)
Neutrophils Relative %: 81 %
Platelets: 354 10*3/uL (ref 150–400)
RBC: 3.23 MIL/uL — ABNORMAL LOW (ref 3.87–5.11)
RDW: 15.2 % (ref 11.5–15.5)
WBC: 9.3 10*3/uL (ref 4.0–10.5)
nRBC: 0 % (ref 0.0–0.2)

## 2021-11-26 LAB — URINALYSIS, COMPLETE (UACMP) WITH MICROSCOPIC
Bilirubin Urine: NEGATIVE
Glucose, UA: 50 mg/dL — AB
Hgb urine dipstick: NEGATIVE
Ketones, ur: NEGATIVE mg/dL
Leukocytes,Ua: NEGATIVE
Nitrite: NEGATIVE
Protein, ur: 30 mg/dL — AB
Specific Gravity, Urine: 1.036 — ABNORMAL HIGH (ref 1.005–1.030)
pH: 6 (ref 5.0–8.0)

## 2021-11-26 LAB — LACTIC ACID, PLASMA
Lactic Acid, Venous: 1.1 mmol/L (ref 0.5–1.9)
Lactic Acid, Venous: 0.9 mmol/L (ref 0.5–1.9)

## 2021-11-26 LAB — RESP PANEL BY RT-PCR (FLU A&B, COVID) ARPGX2
Influenza A by PCR: NEGATIVE
Influenza B by PCR: NEGATIVE
SARS Coronavirus 2 by RT PCR: NEGATIVE

## 2021-11-26 LAB — SEDIMENTATION RATE: Sed Rate: 128 mm/hr — ABNORMAL HIGH (ref 0–30)

## 2021-11-26 LAB — PROCALCITONIN: Procalcitonin: 0.1 ng/mL

## 2021-11-26 LAB — CBG MONITORING, ED: Glucose-Capillary: 179 mg/dL — ABNORMAL HIGH (ref 70–99)

## 2021-11-26 MED ORDER — PRAMIPEXOLE DIHYDROCHLORIDE 1 MG PO TABS
1.0000 mg | ORAL_TABLET | Freq: Every day | ORAL | Status: DC
  Administered 2021-11-27: 1 mg via ORAL
  Filled 2021-11-26 (×3): qty 1

## 2021-11-26 MED ORDER — MORPHINE SULFATE (PF) 4 MG/ML IV SOLN
4.0000 mg | Freq: Once | INTRAVENOUS | Status: AC
  Administered 2021-11-26: 4 mg via INTRAVENOUS
  Filled 2021-11-26: qty 1

## 2021-11-26 MED ORDER — ONDANSETRON HCL 4 MG/2ML IJ SOLN
4.0000 mg | Freq: Four times a day (QID) | INTRAMUSCULAR | Status: DC | PRN
  Administered 2021-11-26 – 2021-11-27 (×3): 4 mg via INTRAVENOUS
  Filled 2021-11-26 (×3): qty 2

## 2021-11-26 MED ORDER — LINACLOTIDE 145 MCG PO CAPS
145.0000 ug | ORAL_CAPSULE | Freq: Every day | ORAL | Status: DC | PRN
  Filled 2021-11-26: qty 1

## 2021-11-26 MED ORDER — BUDESONIDE 0.5 MG/2ML IN SUSP: 2.0000 mL | Freq: Every day | RESPIRATORY_TRACT | Status: DC | PRN

## 2021-11-26 MED ORDER — FENTANYL CITRATE PF 50 MCG/ML IJ SOSY
50.0000 ug | PREFILLED_SYRINGE | INTRAMUSCULAR | Status: DC | PRN
  Administered 2021-11-26 – 2021-11-27 (×2): 50 ug via INTRAVENOUS
  Filled 2021-11-26 (×2): qty 1

## 2021-11-26 MED ORDER — VANCOMYCIN HCL 2000 MG/400ML IV SOLN
2000.0000 mg | INTRAVENOUS | Status: DC
  Administered 2021-11-27: 2000 mg via INTRAVENOUS
  Filled 2021-11-26 (×3): qty 400

## 2021-11-26 MED ORDER — FERROUS SULFATE 325 (65 FE) MG PO TABS
325.0000 mg | ORAL_TABLET | Freq: Every day | ORAL | Status: DC
  Filled 2021-11-26 (×3): qty 1

## 2021-11-26 MED ORDER — IRBESARTAN 150 MG PO TABS
150.0000 mg | ORAL_TABLET | Freq: Every day | ORAL | Status: DC
  Administered 2021-11-27 – 2021-11-28 (×2): 150 mg via ORAL
  Filled 2021-11-26 (×4): qty 1

## 2021-11-26 MED ORDER — SODIUM CHLORIDE 0.9 % IV SOLN
12.5000 mg | Freq: Four times a day (QID) | INTRAVENOUS | Status: DC | PRN
  Filled 2021-11-26: qty 0.5

## 2021-11-26 MED ORDER — ONDANSETRON HCL 4 MG/2ML IJ SOLN
4.0000 mg | Freq: Once | INTRAMUSCULAR | Status: AC
  Administered 2021-11-26: 4 mg via INTRAVENOUS
  Filled 2021-11-26: qty 2

## 2021-11-26 MED ORDER — LEVOTHYROXINE SODIUM 50 MCG PO TABS
200.0000 ug | ORAL_TABLET | Freq: Every day | ORAL | Status: DC
  Administered 2021-11-27 – 2021-11-28 (×2): 200 ug via ORAL
  Filled 2021-11-26 (×2): qty 4

## 2021-11-26 MED ORDER — MORPHINE SULFATE (PF) 4 MG/ML IV SOLN: 4.0000 mg | INTRAVENOUS | Status: DC | PRN

## 2021-11-26 MED ORDER — CYCLOBENZAPRINE HCL 10 MG PO TABS: 10.0000 mg | ORAL_TABLET | Freq: Three times a day (TID) | ORAL | Status: DC | PRN

## 2021-11-26 MED ORDER — LABETALOL HCL 5 MG/ML IV SOLN: 5.0000 mg | INTRAVENOUS | Status: DC | PRN

## 2021-11-26 MED ORDER — ACETAMINOPHEN 325 MG RE SUPP: 650.0000 mg | Freq: Four times a day (QID) | RECTAL | Status: DC | PRN

## 2021-11-26 MED ORDER — INSULIN ASPART 100 UNIT/ML IJ SOLN: 0.0000 [IU] | Freq: Every day | INTRAMUSCULAR | Status: DC

## 2021-11-26 MED ORDER — FENTANYL CITRATE PF 50 MCG/ML IJ SOSY
50.0000 ug | PREFILLED_SYRINGE | Freq: Once | INTRAMUSCULAR | Status: AC
  Administered 2021-11-26: 50 ug via INTRAVENOUS
  Filled 2021-11-26: qty 1

## 2021-11-26 MED ORDER — TRAZODONE HCL 50 MG PO TABS
150.0000 mg | ORAL_TABLET | Freq: Every day | ORAL | Status: DC
  Administered 2021-11-27 (×2): 150 mg via ORAL
  Filled 2021-11-26 (×2): qty 1

## 2021-11-26 MED ORDER — IOHEXOL 350 MG/ML SOLN
100.0000 mL | Freq: Once | INTRAVENOUS | Status: AC | PRN
  Administered 2021-11-26: 100 mL via INTRAVENOUS

## 2021-11-26 MED ORDER — ALPRAZOLAM 0.5 MG PO TABS
0.5000 mg | ORAL_TABLET | Freq: Three times a day (TID) | ORAL | Status: DC
  Administered 2021-11-27 – 2021-11-28 (×5): 0.5 mg via ORAL
  Filled 2021-11-26 (×5): qty 1

## 2021-11-26 MED ORDER — MORPHINE SULFATE (PF) 2 MG/ML IV SOLN
2.0000 mg | INTRAVENOUS | Status: DC | PRN
  Administered 2021-11-27: 2 mg via INTRAVENOUS
  Filled 2021-11-26: qty 1

## 2021-11-26 MED ORDER — SODIUM CHLORIDE 0.9 % IV SOLN
2.0000 g | Freq: Three times a day (TID) | INTRAVENOUS | Status: DC
  Administered 2021-11-27 – 2021-11-28 (×4): 2 g via INTRAVENOUS
  Filled 2021-11-26 (×7): qty 2

## 2021-11-26 MED ORDER — ENOXAPARIN SODIUM 80 MG/0.8ML IJ SOSY
0.5000 mg/kg | PREFILLED_SYRINGE | INTRAMUSCULAR | Status: DC
Start: 2021-11-27 — End: 2021-11-28
  Administered 2021-11-27 – 2021-11-28 (×2): 72.5 mg via SUBCUTANEOUS
  Filled 2021-11-26 (×4): qty 0.72

## 2021-11-26 MED ORDER — FLUTICASONE PROPIONATE 50 MCG/ACT NA SUSP
2.0000 | Freq: Every day | NASAL | Status: DC | PRN
  Filled 2021-11-26: qty 16

## 2021-11-26 MED ORDER — INSULIN ASPART 100 UNIT/ML IJ SOLN: 0.0000 [IU] | Freq: Three times a day (TID) | INTRAMUSCULAR | Status: DC

## 2021-11-26 MED ORDER — ONDANSETRON HCL 4 MG PO TABS: 4.0000 mg | ORAL_TABLET | Freq: Four times a day (QID) | ORAL | Status: DC | PRN

## 2021-11-26 MED ORDER — IRBESARTAN 150 MG PO TABS: 150.0000 mg | ORAL_TABLET | Freq: Every day | ORAL | Status: DC

## 2021-11-26 MED ORDER — CARVEDILOL 25 MG PO TABS
50.0000 mg | ORAL_TABLET | Freq: Two times a day (BID) | ORAL | Status: DC
  Administered 2021-11-27 – 2021-11-28 (×4): 50 mg via ORAL
  Filled 2021-11-26 (×4): qty 2

## 2021-11-26 MED ORDER — SODIUM CHLORIDE 0.9 % IV SOLN
2.0000 g | Freq: Once | INTRAVENOUS | Status: AC
  Administered 2021-11-26: 2 g via INTRAVENOUS
  Filled 2021-11-26: qty 2

## 2021-11-26 MED ORDER — ESCITALOPRAM OXALATE 10 MG PO TABS
10.0000 mg | ORAL_TABLET | Freq: Every day | ORAL | Status: DC
  Administered 2021-11-27 – 2021-11-28 (×2): 10 mg via ORAL
  Filled 2021-11-26 (×3): qty 1

## 2021-11-26 MED ORDER — VANCOMYCIN HCL IN DEXTROSE 1-5 GM/200ML-% IV SOLN
1000.0000 mg | Freq: Once | INTRAVENOUS | Status: AC
  Administered 2021-11-26: 1000 mg via INTRAVENOUS
  Filled 2021-11-26: qty 200

## 2021-11-26 MED ORDER — ALBUTEROL SULFATE HFA 108 (90 BASE) MCG/ACT IN AERS: 2.0000 | INHALATION_SPRAY | Freq: Three times a day (TID) | RESPIRATORY_TRACT | Status: DC | PRN

## 2021-11-26 MED ORDER — PANTOPRAZOLE SODIUM 40 MG PO TBEC
40.0000 mg | DELAYED_RELEASE_TABLET | Freq: Two times a day (BID) | ORAL | Status: DC
  Administered 2021-11-27 – 2021-11-28 (×4): 40 mg via ORAL
  Filled 2021-11-26 (×5): qty 1

## 2021-11-26 MED ORDER — ZOLPIDEM TARTRATE 5 MG PO TABS: 10.0000 mg | ORAL_TABLET | Freq: Every evening | ORAL | Status: DC | PRN

## 2021-11-26 MED ORDER — CARVEDILOL 25 MG PO TABS: 50.0000 mg | ORAL_TABLET | Freq: Two times a day (BID) | ORAL | Status: DC

## 2021-11-26 MED ORDER — HYDRALAZINE HCL 50 MG PO TABS
50.0000 mg | ORAL_TABLET | Freq: Two times a day (BID) | ORAL | Status: DC
  Administered 2021-11-27: 50 mg via ORAL
  Filled 2021-11-26: qty 1

## 2021-11-26 MED ORDER — LACTATED RINGERS IV BOLUS
1000.0000 mL | Freq: Once | INTRAVENOUS | Status: AC
  Administered 2021-11-26: 1000 mL via INTRAVENOUS

## 2021-11-26 MED ORDER — ACETAMINOPHEN 325 MG PO TABS: 650.0000 mg | ORAL_TABLET | Freq: Four times a day (QID) | ORAL | Status: DC | PRN

## 2021-11-26 MED ORDER — VANCOMYCIN HCL 1500 MG/300ML IV SOLN
1500.0000 mg | Freq: Once | INTRAVENOUS | Status: AC
  Administered 2021-11-27: 1500 mg via INTRAVENOUS
  Filled 2021-11-26: qty 300

## 2021-11-26 MED ORDER — PRAVASTATIN SODIUM 20 MG PO TABS
20.0000 mg | ORAL_TABLET | Freq: Every day | ORAL | Status: DC
  Filled 2021-11-26 (×2): qty 1

## 2021-11-26 NOTE — ED Provider Notes (Signed)
[facility] Emergency Department Provider Note  ____________________________________________   I have reviewed the triage vital signs and the nursing notes.   HISTORY  Chief Complaint Emesis and Fever   History limited by:  Confusion   HPI Brittney [name] is a 70 y.o. female who presents to the emergency department today because of concern for nausea and vomiting. Unfortunately patient is not a great historian. She does state that she was discharged from [facility] yesterday after roughly a month long stay. She says that shortly after arriving home she started vomiting. The patient says she has had some associated abdominal pain. She also has had increased pain in her right foot. Had surgery roughly 1 month ago at the start of her hospitalization.    Records reviewed. Per medical record review patient has a history of prolonged hospitalization after initial admission roughly 1 month ago for open right tib/fib fracture.  Past Medical History:  Diagnosis Date   Abdominal pain, left upper quadrant 12/11/2012   Anemia    Arthritis    Asthma    Broken leg 2014   fell twice and broke same leg. had a bone stimulator for first break.   Cancer ([place]) 2007   melanoma, left ear and back of left leg, removed from back   Chronic kidney disease, stage 2 (mild)    Chronic lower back pain    Collagen vascular disease (HCC)    Complication of anesthesia    slow to wake, coughing   Coronary artery disease    Diabetes mellitus without complication ([place])    Diabetic nephropathy associated with secondary diabetes mellitus ([place])    Flushing 12/11/2012   Gastroparesis    Gross hematuria 12/11/2012   Hepatic cirrhosis ([place])    History of kidney stones    Hyperlipidemia    Hypertension    Hypothyroidism    Hypothyroidism    IBS (irritable bowel syndrome)    Kidney stone 12/11/2012   Lower extremity edema    Morbid obesity with BMI of 40.0-44.9, adult (HCC)     Motion sickness    back seat of cars   MRSA (methicillin resistant staph aureus) culture positive 2016   Myocardial infarction ([place]) 09/2013   Nausea without vomiting 12/11/2012   Nephrolithiasis    Nephrolithiasis 04/26/2014   Nonproliferative retinopathy due to secondary diabetes (HCC)    Numbness and tingling of right leg    Peripheral vascular disease ([place])    Renal colic 83/38/2505   Renal insufficiency    Sciatica    Sciatica 12/11/2012   Skin cancer 08/2018   Sleep apnea    USES CPAP   Spinal cord stimulator status    battery in left buttock   Stroke ([place]) 07/2020   Unilateral small kidney without contralateral hypertrophy    Wears dentures    full lower   Wheelchair dependent    reports able to transfer with assist    Patient Active Problem List   Diagnosis Date Noted   Cellulitis of right leg 07/09/2021   Left leg cellulitis 07/08/2021   CHF (congestive heart failure) ([place]) 07/07/2021   Chronic kidney disease, stage 3a ([place]) 07/07/2021   Left fibular fracture 07/07/2021   Bilateral lower leg cellulitis 08/18/2020   Rhabdomyolysis 08/18/2020   Fall at home, initial encounter 08/18/2020   Fall 08/18/2020   Primary osteoarthritis of left knee 11/26/2019   Sepsis ([place]) 07/30/2019   Type 2 diabetes mellitus with diabetic polyneuropathy, with long-term current use  of insulin ([place]) 07/03/2019   OSA on CPAP 01/24/2019   Lymphedema 09/03/2018   Venous stasis of both lower extremities 09/03/2018   Status post lumbar spine surgery for decompression of spinal cord 03/30/2018   Myofascial pain 03/30/2018   Spinal stenosis of thoracic region 03/30/2018   Back pain 11/02/2017   Iron deficiency anemia 05/10/2017   Benign essential hypertension 03/10/2017   Long term prescription benzodiazepine use 01/18/2017   Vitamin D insufficiency 01/18/2017   Neurogenic pain 01/17/2017   L3-4 severe lumbar facet hypertrophy and spinal stenosis 01/10/2017   Diabetes with retinopathy  ([place]) 12/07/2016   Diabetic nephropathy ([place]) 12/07/2016   Gastroparesis due to DM ([place]) 12/07/2016   Long term current use of opiate analgesic 12/07/2016   Long term prescription opiate use 12/07/2016   Opiate use 12/07/2016   Chronic pain syndrome 12/07/2016   Chronic low back pain (Location of Primary Source of Pain) (Bilateral) (R>L) 12/07/2016   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/07/2016   Chronic lower extremity pain (Location of Secondary source of pain) (Bilateral) (R>L) 12/07/2016   Chronic knee pain (Location of Tertiary source of pain) (Right) 12/07/2016   Osteoarthritis of knee (Right) 12/07/2016   Grade 1 Anterolisthesis of L3 over L4 and L4 over L5 12/07/2016   Osteoarthritis of sacroiliac joint (Right) 12/07/2016   Generalized osteoarthritis of multiple sites 10/22/2016   Degenerative spondylolisthesis 09/08/2016   L3-4 severe lumbar spinal stenosis (12/31/2016 MRI) 09/08/2016   Lung nodule, multiple 08/31/2016   Hepatic cirrhosis ([place]) 06/20/2016   Fibromyalgia 04/15/2016   Seronegative arthritis 04/15/2016   Diabetes mellitus with peripheral vascular disease ([place]) 03/16/2016   Type 2 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, with long-term current use of insulin ([place]) 03/16/2016   Morbid obesity with BMI of 50.0-59.9, adult ([place]) 03/16/2016   Type 2 diabetes mellitus with diabetic nephropathy, with long-term current use of insulin ([place]) 03/16/2016   Microalbuminuria 02/20/2016   Essential hypertension 01/27/2016   DM (diabetes mellitus) type II uncontrolled, periph vascular disorder 04/26/2014   Hyperlipemia 04/26/2014   OSA (obstructive sleep apnea) 04/26/2014   Abnormal tumor markers 12/11/2012   Chronic kidney disease, stage II (mild) 12/11/2012   Unilateral small kidney 12/11/2012    Past Surgical History:  Procedure Laterality Date   ABDOMINAL HYSTERECTOMY     BACK SURGERY  04/1999    L-4-5 LAMINECTOMY. metal in lower back and neck   CATARACT EXTRACTION W/PHACO Right 09/17/2021   Procedure: CATARACT EXTRACTION PHACO AND INTRAOCULAR LENS PLACEMENT ([place]) RIGHT DIABETIC;  Surgeon: Leandrew Koyanagi, MD;  Location: [facility] ORS;  Service: Ophthalmology;  Laterality: Right;  5.64 0:58.9   [place]     COLONOSCOPY  05/04/2001   COLONOSCOPY WITH PROPOFOL N/A 09/20/2016   Procedure: COLONOSCOPY WITH PROPOFOL;  Surgeon: Lollie Sails, MD;  Location: [facility] ENDOSCOPY;  Service: Endoscopy;  Laterality: N/A;   DILATION AND CURETTAGE OF UTERUS     ESOPHAGOGASTRODUODENOSCOPY  02/08/2014   ESOPHAGOGASTRODUODENOSCOPY (EGD) WITH PROPOFOL N/A 09/20/2016   Procedure: ESOPHAGOGASTRODUODENOSCOPY (EGD) WITH PROPOFOL;  Surgeon: Lollie Sails, MD;  Location: [facility] ENDOSCOPY;  Service: Endoscopy;  Laterality: N/A;   ESOPHAGOGASTRODUODENOSCOPY (EGD) WITH PROPOFOL N/A 05/23/2018   Procedure: ESOPHAGOGASTRODUODENOSCOPY (EGD) WITH PROPOFOL;  Surgeon: Lollie Sails, MD;  Location: [facility] ENDOSCOPY;  Service: Endoscopy;  Laterality: N/A;   ESOPHAGOGASTRODUODENOSCOPY (EGD) WITH PROPOFOL N/A 07/18/2020   Procedure: ESOPHAGOGASTRODUODENOSCOPY (EGD) WITH PROPOFOL;  Surgeon: Lesly Rubenstein,  MD;  Location: [facility] ENDOSCOPY;  Service: Gastroenterology;  Laterality: N/A;   EYE SURGERY  1986   FOR MELANOMA   HERNIA REPAIR     JOINT REPLACEMENT Left 09/24/2013   TOTAL KNEE   MOHS SURGERY  11/2016   left side of nose   PULSE GENERATOR IMPLANT N/A 11/02/2017   Procedure: UNILATERAL PULSE GENERATOR IMPLANT;  Surgeon: Meade Maw, MD;  Location: [facility] ORS;  Service: Neurosurgery;  Laterality: N/A;   TONSILLECTOMY     WITH ADENOIDECTOMY    Prior to Admission medications   Medication Sig Start Date End Date Taking? Authorizing Provider  albuterol (VENTOLIN HFA) 108 (90 Base) MCG/ACT inhaler Inhale 2 puffs into the lungs 3 (three) times daily as needed for wheezing or  shortness of breath.     [provider]  ALPRAZolam Duanne Moron) 0.5 MG tablet Take 0.5 mg by mouth 3 (three) times daily.    [provider]  aspirin EC 81 MG tablet Take 81 mg by mouth daily. Swallow whole.    [provider]  budesonide (PULMICORT) 0.5 MG/2ML nebulizer solution Take 2 mLs by nebulization daily as needed (Chronic bronchitis). 07/25/20   [provider]  carvedilol (COREG) 25 MG tablet Take 50 mg by mouth 2 (two) times daily with a meal.    [provider]  Cholecalciferol (VITAMIN D-3) 125 MCG (5000 UT) TABS Take 5,000 Units by mouth daily at 8 pm.    [provider]  cloNIDine (CATAPRES - DOSED IN MG/24 HR) 0.3 mg/24hr patch Place 0.3 mg onto the skin once a week.     [provider]  cyclobenzaprine (FLEXERIL) 10 MG tablet Take 20 mg by mouth at bedtime. 12/19/19   [provider]  diclofenac Sodium (VOLTAREN) 1 % GEL Apply 2 g topically daily as needed (pain).    [provider]  escitalopram (LEXAPRO) 10 MG tablet Take 10 mg by mouth daily.  12/13/12   [provider]  fluticasone (FLONASE) 50 MCG/ACT nasal spray Place 2 sprays into both nostrils daily as needed for allergies.    [provider]  furosemide (LASIX) 40 MG tablet Take 40 mg by mouth daily as needed for fluid or edema. 08/14/20   [provider]  gabapentin (NEURONTIN) 600 MG tablet Take 1 tablet (600mg ) by mouth twice daily and take 3 tablets (1800mg ) by mouth at bedtime Patient taking differently: Take 2,400 mg by mouth at bedtime. 08/21/20   Mercy Riding, MD  hydrALAZINE (APRESOLINE) 50 MG tablet Take 50 mg by mouth 2 (two) times daily. 08/01/21   [provider]  HYSEPT 0.25 % SOLN Apply 1 application topically daily. 08/06/21   [provider]  ibuprofen (ADVIL) 800 MG tablet Take 800 mg by mouth daily.    [provider]  Insulin Human (INSULIN PUMP) SOLN Inject into the skin  continuous. Humulin R 100    [provider]  ipratropium-albuterol (DUONEB) 0.5-2.5 (3) MG/3ML SOLN Take 3 mLs by nebulization every 6 (six) hours as needed (bronchitis). 07/25/20   [provider]  KLOR-CON M10 10 MEQ tablet Take 10 mEq by mouth daily. 06/09/21   [provider]  levothyroxine (SYNTHROID) 200 MCG tablet Take 200 mcg by mouth daily before breakfast.    [provider]  linaclotide (LINZESS) 145 MCG CAPS capsule Take 145 mcg by mouth daily as needed (Constipation).    [provider]  magnesium oxide (MAG-OX) 400 MG tablet Take 400 mg by mouth daily.  [provider]  metolazone (ZAROXOLYN) 5 MG tablet Take 5 mg by mouth 2 (two) times a week. 01/24/20 07/07/21  [provider]  ondansetron (ZOFRAN) 4 MG tablet Take 4 mg by mouth 2 (two) times daily. 05/14/19   [provider]  oxyCODONE-acetaminophen (PERCOCET) 10-325 MG tablet Take 1 tablet by mouth every 12 (twelve) hours as needed for pain. Must last 30 days. 11/14/21 12/14/21  [name], MD  oxyCODONE-acetaminophen (PERCOCET) 10-325 MG tablet Take 1 tablet by mouth every 12 (twelve) hours as needed for pain. Must last 30 days. 12/14/21 01/13/22  [name], MD  pantoprazole (PROTONIX) 40 MG tablet Take 40 mg by mouth 2 (two) times daily.     [provider]  pramipexole (MIRAPEX) 0.5 MG tablet Take 1 mg by mouth at bedtime.    [provider]  telmisartan (MICARDIS) 40 MG tablet Take 40 mg by mouth daily. 08/14/20   [provider]  traMADol (ULTRAM) 50 MG tablet Take 100 mg by mouth at bedtime.    [provider]  traZODone (DESYREL) 150 MG tablet Take 150 mg by mouth at bedtime.  12/11/12   [provider]  vitamin C (ASCORBIC ACID) 500 MG tablet Take 500 mg by mouth daily.    [provider]    Allergies Shellfish allergy, Cephalexin, Amlodipine, Codeine, Imdur [isosorbide dinitrate], Lyrica  [pregabalin], Monosodium glutamate, and Tape  Family History  Problem Relation Age of Onset   Heart disease Mother    Cancer Mother    Asthma Mother    Diabetes Father    Kidney disease Father    Hypertension Father    Breast cancer Neg Hx     Social History Social History   Tobacco Use   Smoking status: Never   Smokeless tobacco: Never  Vaping Use   Vaping Use: Never used  Substance Use Topics   Alcohol use: No   Drug use: Never    Review of Systems Constitutional: No fever/chills Eyes: No visual changes. ENT: No sore throat. Cardiovascular: Denies chest pain. Respiratory: Denies shortness of breath. Gastrointestinal: Positive for nausea, vomiting, abdominal pain. Genitourinary: Negative for dysuria. Musculoskeletal: Positive for right leg pain. Skin: Negative for rash. Neurological: Negative for headaches, focal weakness or numbness.  ____________________________________________   PHYSICAL EXAM:  VITAL SIGNS: ED Triage Vitals  Enc Vitals Group     BP 11/26/21 1509 (!) 179/76     Pulse Rate 11/26/21 1509 96     Resp 11/26/21 1509 (!) 24     Temp 11/26/21 1509 (!) 103.3 F (39.6 C)     Temp Source 11/26/21 1509 Oral     SpO2 11/26/21 1509 98 %     Weight 11/26/21 1518 (!) 319 lb 10.7 oz (145 kg)     Height 11/26/21 1518 5\' 6"  (1.676 m)     Head Circumference --      Peak Flow --      Pain Score 11/26/21 1520 0   Constitutional: Awake and alert. Not completely oriented.  Eyes: Conjunctivae are normal.  ENT      Head: Normocephalic and atraumatic.      Nose: No congestion/rhinnorhea.      Mouth/Throat: Mucous membranes are moist.      Neck: No stridor. Hematological/Lymphatic/Immunilogical: No cervical lymphadenopathy. Cardiovascular: Normal rate, regular rhythm.  No murmurs, rubs, or gallops.  Respiratory: Normal respiratory effort without tachypnea nor retractions. Breath sounds are clear and equal bilaterally. No  wheezes/rales/rhonchi. Gastrointestinal: Soft and non  tender. No rebound. No guarding.  Genitourinary: Deferred Musculoskeletal: Surgical incision sites intact in rle. Some redness and warmth present.  Neurologic:  Confusion, poor historian.  Skin:  Skin is warm, dry and intact. No rash noted. Psychiatric: Calm  ____________________________________________    LABS (pertinent positives/negatives)  Lactic acid 0.9 COVID/influenza negative BMP na 130, k 3.9, glu 274, cr 0.75 UA hazy, 6-10 RBC and WBC, rare bacteria CBC wbc 9.3, hgb 8.9, plt 354   ____________________________________________    RADIOLOGY  CXR Cardiomegaly with vascular congestion  CT angio PE No PE. No pneumonia  CT abd/pel No acute intraabdominal abnormality  CT right tib fib Concern for cellulitis. No abscess. No evidence for osteomyelitis.  ____________________________________________   PROCEDURES  Procedures  ____________________________________________   INITIAL IMPRESSION / ASSESSMENT AND PLAN / ED COURSE  Pertinent labs & imaging results that were available during my care of the patient were reviewed by me and considered in my medical decision making (see chart for details).   Patient presented to the emergency department today because of concerns for nausea, vomiting as well as some increased shortness of breath.  Patient had recent prolonged hospitalization at [facility] after right tib-fib fracture.  Extensive work-up was started here given history of recent hospitalization.  This time think fever is likely secondary to cellulitis.  No evidence of PE.  No intra-abdominal infection.  Did a CT of the tib-fib region of her right foot which not show any abscess or evidence of osteomyelitis.  Did have a discussion with patient and family about potentially returning to [facility] given recent hospitalization and surgery.  At this time they would like to stay at [facility].  Patient was given IV antibiotics.  Will  plan admission to the hospital service.  ____________________________________________   FINAL CLINICAL IMPRESSION(S) / ED DIAGNOSES  Final diagnoses:  Cellulitis, unspecified cellulitis site     Note: This dictation was prepared with Dragon dictation. Any transcriptional errors that result from this process are unintentional     Nance Pear, MD 11/26/21 2146

## 2021-11-26 NOTE — H&P (Addendum)
History and Physical   Brittney [name]:[id number] DOB: 1951-01-19 DOA: 11/26/2021  PCP: Rusty Aus, MD  Outpatient Specialists: Dr. Gabriel Carina, endocrinology [facility] Patient coming from: Home  I have personally briefly reviewed patient's old medical records in [place].  Chief Concern: Intractable vomiting  HPI: Brittney [name] is a 70 y.o. female with medical history significant for insulin-dependent diabetes mellitus type 2, has insulin pump via U500 [employer], morbid obesity, OSA on BiPAP, hypertension, hyperlipidemia, anxiety, depression, chronic pain syndrome, hypothyroid, gastroparesis secondary to diabetes mellitus, GERD, diabetic neuropathy, diabetic retinopathy, polyneuropathy, history of stroke, osteoporosis, history of peptic ulcer disease, who presents emergency department via EMS from home for chief concerns of intractable nausea and vomiting.  She reported that she vomitted about 10x since 11/30 and 12/1 prior to presenting to the ED. she denies shortness of breath, chest pain, cough, vision changes, abdominal pain, dysuria, syncope, loss of consciousness.  She had three episodes of watery diarrhea. She states this is abnormal for her as she is normally constipated. She has not had any diarrhea since she has been in the hospital for over 8 hours.   Social history: She lives at home by herself. She had an aid to help her with bathing with legs over the tub,M-F and she uses a picker upper to pull up her cloths. Her aid helps her get out of the tub. She is wheelchair prior to the fall due to chronic pain from back pain. She has never smoked, drank etoh, recreational drug use. She is disabled and was formerly a Occupational psychologist for 27 years.   Vaccination history: She is not vaccinated for covid 19 and states she does not want to be vaccinated.   ROS: Constitutional: no weight change, + fever ENT/Mouth: no sore throat, no rhinorrhea Eyes: no eye pain, no vision  changes Cardiovascular: no chest pain, no dyspnea,  no edema, no palpitations Respiratory: no cough, no sputum, no wheezing Gastrointestinal: + nausea, + vomiting, + diarrhea, no constipation Genitourinary: no urinary incontinence, no dysuria, no hematuria Musculoskeletal: no arthralgias, + myalgias Skin: no skin lesions, no pruritus, Neuro: + weakness, no loss of consciousness, no syncope Psych: no anxiety, no depression, + decrease appetite Heme/Lymph: no bruising, no bleeding  ED Course: Discussed with emergency medicine provider, patient requiring hospitalization for chief concerns of cellulitis.  Vitals in the emergency department was remarkable for T-max of 103, respiration rate of 24, heart rate of 94, initial blood pressure 191/77, improved to 184/73, SPO2 of 96% on 2 L nasal cannula.  Labs in the emergency department was remarkable for serum sodium 130, potassium 3.9, chloride of 92, bicarb of 33, BUN of 15, serum creatinine of 0.75, nonfasting blood glucose 274, GFR greater than 60, WBC 9.3, hemoglobin 8.9, platelets 354.  COVID/influenza A/influenza B PCR were negative.  UA was negative for nitrates and leukocytes.  GFR was greater than 60.  Lactic acid was not elevated at 1 and then decreased to 0.9.  Blood cultures x2 are ordered and pending.  In the emergency department patient was given fentanyl 50 mcg IV, morphine 4 mg IV x1, Zofran 4 mg IV, cefepime, vancomycin, lactated ringer 1 L bolus.  Assessment/Plan  Principal Problem:   Cellulitis Active Problems:   Gastroparesis due to DM (HCC)   Generalized osteoarthritis of multiple sites   Hyperlipemia   OSA (obstructive sleep apnea)   Seronegative arthritis   Essential hypertension   Long term current use of opiate analgesic  Opiate use   Chronic pain syndrome   Morbid obesity with BMI of 50.0-59.9, adult (HCC)   Status post lumbar spine surgery for decompression of spinal cord   Spinal stenosis of thoracic region    Lymphedema   Venous stasis of both lower extremities   Sepsis ([place])   Type 2 diabetes mellitus with diabetic polyneuropathy, with long-term current use of insulin (HCC)   Benign essential hypertension   OSA on CPAP   Fall   Polypharmacy   # Right posterior lateral and anterior medial aspect of lower extremity cellulitis-present on admission # Patient meets sepsis criteria with fever, elevated respiration rate, source of cellulitis of the right knee - I presume that the nidus is secondary to operative procedures and or lower extremity lymphedema in setting of insulin-dependent diabetes mellitus and PAD resulting in poor circulation - Patient has insulin-dependent diabetes mellitus type 2 and morbid obesity, we will cover for MRSA and Pseudomonas - Vancomycin and cefepime per pharmacy - Admit to telemetry medicine floor  # Intractable nausea and vomiting with diarrhea-query gastroenteritis - Check C. difficile and GI panel ordered - Supportive measures  # Insulin-dependent diabetes mellitus - Insulin SSI with at bedtime coverage for morbid obesity - Patient has a U500 insulin pump - Per last endocrine note on 10/19/2021: Insulin pump adjustment rate: 12 AM to 1.2 units/h, 11 AM 1.75 units/h; 11:30 PM 1.35 units/h - Insulin pump order set has been utilized however there is no area to adjust the rate as per her endocrinologist - A.m. team to discuss with pharmacy for further management and recommendation - Goal inpatient blood glucose is 140-180  # Hypertension-resumed carvedilol 25 mg p.o. twice daily on day of admission - Resumed home irbesartan 150 mg for day of admission - Hydralazine 50 mg p.o. twice daily resumed for 11/27/2021 - Labetalol injection 5 mg IV every 3 hours as needed for SBP greater than 180, 4 doses ordered  # Hyperlipidemia-pravastatin 20 mg daily resumed  # Anxiety/depression-resumed escitalopram 10 mg daily - Resumed alprazolam 0.5 mg p.o. 3 times daily  #  Insomnia-resume trazodone 150 mg nightly  # Hyponatremia-corrected sodium level is 134  # OSA-BiPAP nightly ordered  # Constipation secondary to opioid medications - Resumed home linaclotide 150 mcg daily as needed for constipation  # Polypharmacy-patient takes Flexeril 10 mg 3 times daily, zolpidem 10 mg nightly, trazodone 150 mg nightly, tramadol 50 mg nightly, alprazolam 0.5 mg 3 times daily, oxycodone 10 mg twice daily, gabapentin 2400 mg nightly - At this time I am holding Flexeril, zolpidem, tramadol, gabapentin 2400 mg nightly due to patient's risk of developing acute hypoxic respiratory failure requiring intubation - Patient has increased risk of acute hypoxic respiratory failure due to hypoventilation syndrome secondary to morbid obesity and polypharmacy as above - Extensive discussion with patient at bedside that I will be holding Flexeril, zolpidem, tramadol, gabapentin due to patient's risk of polypharmacy and acute respiratory failure requiring intubation - Patient though initially hesitant, endorses understanding and agreement  Chart reviewed.   10/30/2021 to 11/25/2021: Patient was seen at [facility] on 11/4 for chief concerns of a fall while transferring from her wheelchair to another chair and was diagnosed with segmental compound tibia fracture.  Orthopedic service, Dr. Sabra Heck was consulted and recommended that a trauma orthopedist manage this patient and patient to be transferred for tertiary center.  Patient was subsequently transferred to [facility], accepting physician at that time was Dr. Cherene Altes. # Type III open fracture  of distal end of the right tibia-patient is status post tibial shaft fracture with intramedullary implant with and without interlocking screws and cerclage on 10/31/2021 via Dr. Henry Russel - Patient had debridement # Right type II open segmental tibial shaft fracture-was presumed secondary to fragility fracture due to low level of vitamin  D  # Closed fracture of the proximal end of the right fibula Patient had hematochezia, presumed GI bleed and had iron deficiency anemia-she had a luminal gastroenterology on 11/13/2021 for GI bleed.  EGD showed multiple benign fundic gland polyps and nonbleeding AVM which was cauterized.  Per discharge summary, patient had declined colonoscopy during this admission. - Patient received IV Ferrlecit to 50 mg every 12 hours, 4 doses  10/19/2021 Endocrinology [place] [facility] outpatient note-insulin-dependent diabetes mellitus type 2, diabetic polyneuropathy, patient has a insulin pump Insulin pump adjustment rate: 12 AM to 1.2 units/h, 11 AM 1.75 units/h; 11:30 PM 1.35 units/h  DVT prophylaxis: Enoxaparin Code Status: Full code Diet: Heart healthy/carb modified Family Communication: None, she states that her daughter knows she is going to be admitted to the hospital Disposition Plan: Pending clinical course Consults called: Orthopedic Admission status: Telemetry medical, observation  Past Medical History:  Diagnosis Date   Abdominal pain, left upper quadrant 12/11/2012   Anemia    Arthritis    Asthma    Broken leg 2014   fell twice and broke same leg. had a bone stimulator for first break.   Cancer ([place]) 2007   melanoma, left ear and back of left leg, removed from back   Chronic kidney disease, stage 2 (mild)    Chronic lower back pain    Collagen vascular disease (HCC)    Complication of anesthesia    slow to wake, coughing   Coronary artery disease    Diabetes mellitus without complication ([place])    Diabetic nephropathy associated with secondary diabetes mellitus ([place])    Flushing 12/11/2012   Gastroparesis    Gross hematuria 12/11/2012   Hepatic cirrhosis ([place])    History of kidney stones    Hyperlipidemia    Hypertension    Hypothyroidism    Hypothyroidism    IBS (irritable bowel syndrome)    Kidney stone 12/11/2012   Lower extremity edema    Morbid obesity with BMI of  40.0-44.9, adult (HCC)    Motion sickness    back seat of cars   MRSA (methicillin resistant staph aureus) culture positive 2016   Myocardial infarction ([place]) 09/2013   Nausea without vomiting 12/11/2012   Nephrolithiasis    Nephrolithiasis 04/26/2014   Nonproliferative retinopathy due to secondary diabetes (HCC)    Numbness and tingling of right leg    Peripheral vascular disease ([place])    Renal colic 54/62/7035   Renal insufficiency    Sciatica    Sciatica 12/11/2012   Skin cancer 08/2018   Sleep apnea    USES CPAP   Spinal cord stimulator status    battery in left buttock   Stroke ([place]) 07/2020   Unilateral small kidney without contralateral hypertrophy    Wears dentures    full lower   Wheelchair dependent    reports able to transfer with assist   Past Surgical History:  Procedure Laterality Date   ABDOMINAL HYSTERECTOMY     BACK SURGERY  04/1999   L-4-5 LAMINECTOMY. metal in lower back and neck   CATARACT EXTRACTION W/PHACO Right 09/17/2021   Procedure: CATARACT EXTRACTION PHACO AND INTRAOCULAR LENS PLACEMENT ([place]) RIGHT DIABETIC;  Surgeon: Leandrew Koyanagi, MD;  Location: [facility] ORS;  Service: Ophthalmology;  Laterality: Right;  5.64 0:58.9   [place]     COLONOSCOPY  05/04/2001   COLONOSCOPY WITH PROPOFOL N/A 09/20/2016   Procedure: COLONOSCOPY WITH PROPOFOL;  Surgeon: Lollie Sails, MD;  Location: [facility] ENDOSCOPY;  Service: Endoscopy;  Laterality: N/A;   DILATION AND CURETTAGE OF UTERUS     ESOPHAGOGASTRODUODENOSCOPY  02/08/2014   ESOPHAGOGASTRODUODENOSCOPY (EGD) WITH PROPOFOL N/A 09/20/2016   Procedure: ESOPHAGOGASTRODUODENOSCOPY (EGD) WITH PROPOFOL;  Surgeon: Lollie Sails, MD;  Location: [facility] ENDOSCOPY;  Service: Endoscopy;  Laterality: N/A;   ESOPHAGOGASTRODUODENOSCOPY (EGD) WITH PROPOFOL N/A 05/23/2018   Procedure: ESOPHAGOGASTRODUODENOSCOPY (EGD) WITH PROPOFOL;  Surgeon: Lollie Sails, MD;  Location[facility] ENDOSCOPY;   Service: Endoscopy;  Laterality: N/A;   ESOPHAGOGASTRODUODENOSCOPY (EGD) WITH PROPOFOL N/A 07/18/2020   Procedure: ESOPHAGOGASTRODUODENOSCOPY (EGD) WITH PROPOFOL;  Surgeon: Lesly Rubenstein, MD;  Location: [facility] ENDOSCOPY;  Service: Gastroenterology;  Laterality: N/A;   EYE SURGERY  1986   FOR MELANOMA   HERNIA REPAIR     JOINT REPLACEMENT Left 09/24/2013   TOTAL KNEE   MOHS SURGERY  11/2016   left side of nose   PULSE GENERATOR IMPLANT N/A 11/02/2017   Procedure: UNILATERAL PULSE GENERATOR IMPLANT;  Surgeon: Meade Maw, MD;  Location: [facility] ORS;  Service: Neurosurgery;  Laterality: N/A;   TONSILLECTOMY     WITH ADENOIDECTOMY   Social History:  reports that she has never smoked. She has never used smokeless tobacco. She reports that she does not drink alcohol and does not use drugs.  Allergies  Allergen Reactions   Shellfish Allergy Nausea And Vomiting, Diarrhea and Nausea Only    Non stop sickness once ingests shellfish. Betadine is okay   Cephalexin Other (See Comments)    Doesn't digest it well   Amlodipine Cough   Codeine Nausea And Vomiting and Nausea Only    states she can take codeine cough syrup without problems states she can take codeine cough syrup without problems   Imdur [Isosorbide Dinitrate] Other (See Comments) and Cough    Headache   Lyrica [Pregabalin] Swelling and Other (See Comments)    "FLU-LIKE" SYMPTOMS   Monosodium Glutamate Other (See Comments) and Cough    Headache   Tape Other (See Comments) and Nausea And Vomiting    can use paper tape Redness   Family History  Problem Relation Age of Onset   Heart disease Mother    Cancer Mother    Asthma Mother    Diabetes Father    Kidney disease Father    Hypertension Father    Breast cancer Neg Hx    Family history: Family history reviewed and pertinent diabetes in father.  Prior to Admission medications   Medication Sig Start Date End Date Taking? Authorizing Provider  acetaminophen (TYLENOL)  325 MG tablet Take 650 mg by mouth every 4 (four) hours as needed.   Yes [provider]  albuterol (VENTOLIN HFA) 108 (90 Base) MCG/ACT inhaler Inhale 2 puffs into the lungs 3 (three) times daily as needed for wheezing or shortness of breath.    Yes [provider]  ALPRAZolam Duanne Moron) 0.5 MG tablet Take 0.5 mg by mouth 3 (three) times daily.   Yes [provider]  calcium carbonate (TUMS EX) 750 MG chewable tablet Chew 1 tablet by mouth daily.   Yes [provider]  carvedilol (COREG) 25 MG tablet Take 50 mg by mouth 2 (two)  times daily with a meal.   Yes [provider]  Cholecalciferol (VITAMIN D-3) 25 MCG (1000 UT) CAPS Take 2,000 Units by mouth daily at 8 pm.   Yes [provider]  cloNIDine (CATAPRES - DOSED IN MG/24 HR) 0.3 mg/24hr patch Place 0.3 mg onto the skin once a week.    Yes [provider]  cyclobenzaprine (FLEXERIL) 10 MG tablet Take 10 mg by mouth 3 (three) times daily as needed. 12/19/19  Yes [provider]  enoxaparin (LOVENOX) 40 MG/0.4ML injection Inject 40 mg into the skin every 12 (twelve) hours.   Yes [provider]  escitalopram (LEXAPRO) 10 MG tablet Take 10 mg by mouth daily.  12/13/12  Yes [provider]  ferrous sulfate 325 (65 FE) MG tablet Take 325 mg by mouth daily with breakfast.   Yes [provider]  fluticasone (FLONASE) 50 MCG/ACT nasal spray Place 2 sprays into both nostrils daily as needed for allergies.   Yes [provider]  furosemide (LASIX) 40 MG tablet Take 40 mg by mouth daily as needed for fluid or edema. 08/14/20  Yes [provider]  gabapentin (NEURONTIN) 600 MG tablet Take 1 tablet (600mg ) by mouth twice daily and take 3 tablets (1800mg ) by mouth at bedtime Patient taking differently: Take 2,400 mg by mouth at bedtime. 08/21/20  Yes Mercy Riding, MD  hydrALAZINE (APRESOLINE) 50 MG tablet Take 50 mg by mouth 2 (two) times daily.  08/01/21  Yes [provider]  Insulin Human (INSULIN PUMP) SOLN Inject into the skin continuous. Humulin R 100   Yes [provider]  KLOR-CON M10 10 MEQ tablet Take 10 mEq by mouth daily. 06/09/21  Yes [provider]  levothyroxine (SYNTHROID) 200 MCG tablet Take 200 mcg by mouth daily before breakfast.   Yes [provider]  linaclotide (LINZESS) 145 MCG CAPS capsule Take 145 mcg by mouth daily as needed (Constipation).   Yes [provider]  NONFORMULARY OR COMPOUNDED ITEM Apply 1 application topically 2 (two) times daily as needed. nifedipine 0.3% lidocaine 1.5% in petrolatum ointment   Yes [provider]  ondansetron (ZOFRAN) 4 MG tablet Take 4 mg by mouth 2 (two) times daily. 05/14/19  Yes [provider]  oxyCODONE (OXY IR/ROXICODONE) 5 MG immediate release tablet Take 10 mg by mouth 2 (two) times daily.   Yes [provider]  pantoprazole (PROTONIX) 40 MG tablet Take 40 mg by mouth 2 (two) times daily.    Yes [provider]  pramipexole (MIRAPEX) 0.5 MG tablet Take 1 mg by mouth at bedtime.   Yes [provider]  pravastatin (PRAVACHOL) 20 MG tablet Take 20 mg by mouth daily.   Yes [provider]  telmisartan (MICARDIS) 40 MG tablet Take 40 mg by mouth daily. 08/14/20  Yes [provider]  traZODone (DESYREL) 150 MG tablet Take 150 mg by mouth at bedtime.  12/11/12  Yes [provider]  zolpidem (AMBIEN) 10 MG tablet Take 10 mg by mouth at bedtime as needed for sleep.   Yes [provider]  aspirin EC 81 MG tablet Take 81 mg by mouth daily. Swallow whole.    [provider]  budesonide (PULMICORT) 0.5 MG/2ML nebulizer solution Take 2 mLs by nebulization daily as needed (Chronic bronchitis). 07/25/20   [provider]  diclofenac Sodium (VOLTAREN) 1 % GEL Apply 2 g topically daily as needed (pain).    [provider]  HYSEPT 0.25 % SOLN  Apply  1 application topically daily. 08/06/21   [provider]  ibuprofen (ADVIL) 800 MG tablet Take 800 mg by mouth daily. Patient not taking: Reported on 11/26/2021    [provider]  ipratropium-albuterol (DUONEB) 0.5-2.5 (3) MG/3ML SOLN Take 3 mLs by nebulization every 6 (six) hours as needed (bronchitis). 07/25/20   [provider]  magnesium oxide (MAG-OX) 400 MG tablet Take 400 mg by mouth daily.    [provider]  metolazone (ZAROXOLYN) 5 MG tablet Take 5 mg by mouth 2 (two) times a week. 01/24/20 07/07/21  [provider]  oxyCODONE-acetaminophen (PERCOCET) 10-325 MG tablet Take 1 tablet by mouth every 12 (twelve) hours as needed for pain. Must last 30 days. Patient not taking: Reported on 11/26/2021 11/14/21 12/14/21  [name], MD  oxyCODONE-acetaminophen (PERCOCET) 10-325 MG tablet Take 1 tablet by mouth every 12 (twelve) hours as needed for pain. Must last 30 days. Patient not taking: Reported on 11/26/2021 12/14/21 01/13/22  [name], MD  traMADol (ULTRAM) 50 MG tablet Take 100 mg by mouth at bedtime. Patient not taking: Reported on 11/26/2021    [provider]  vitamin C (ASCORBIC ACID) 500 MG tablet Take 500 mg by mouth daily.    [provider]   Physical Exam: Vitals:   11/26/21 2025 11/26/21 2029 11/26/21 2030 11/26/21 2100  BP: (!) 184/73  (!) 180/69 (!) 159/60  Pulse: 88  87 84  Resp: 20   19  Temp:      TempSrc:      SpO2: 96% 95% 95% 96%  Weight:      Height:       Constitutional: appears older than chronological age, NAD, calm, comfortable Eyes: PERRL, lids and conjunctivae normal ENMT: Mucous membranes are moist. Posterior pharynx clear of any exudate or lesions. Age-appropriate dentition. Hearing appropriate Neck: normal, supple, no masses, no thyromegaly Respiratory: clear to auscultation bilaterally, no wheezing, no crackles. Normal respiratory effort. No accessory muscle use.  Cardiovascular:  Regular rate and rhythm, no murmurs / rubs / gallops. No extremity edema. 2+ pedal pulses. No carotid bruits.  Abdomen: Morbidly obese, no tenderness, no masses palpated, no hepatosplenomegaly. Bowel sounds positive.  Musculoskeletal: no clubbing / cyanosis. No joint deformity upper and lower extremities. Good ROM, no contractures, no atrophy. Normal muscle tone.  Skin: + Lesions with sutures   Neurologic: Sensation intact. Strength 5/5 in all 4.  Psychiatric: Normal judgment and insight. Alert and oriented x 3. Normal mood.   EKG: Ordered to assess QTC  Chest x-ray on Admission: I personally reviewed and I agree with radiologist reading as below.  CT Angio Chest PE W and/or Wo Contrast  Result Date: 11/26/2021 CLINICAL DATA:  Patient presents via EMS from home when daughter called EMS due to pt vomiting excessively. Pt denies abdominal pain but c/o right leg pain 8/10. Vomiting began last night and pt is febrile today. Pt has had some diarrhea past few nights. Pt c/o weakness for a few days. Pt was discharged last night from [place] after having surgery for a broken right leg. Pt unaware of being febrile last night at discharge.Pt originally admitted 10/30/2021 when fell. Pt's temp was 102.5 oral via EMS and pt had low SPO2 in 80's prior to oxygen via nasal cannula. Pt baseline sat on presentation on room air 89% and improved to 97% on 2L/min O2 via nasal cannula. Pt normally only wears O2 at night (3L/min). Pt denies SOB. EXAM: CT ANGIOGRAPHY CHEST CT  ABDOMEN AND PELVIS WITH CONTRAST TECHNIQUE: Multidetector CT imaging of the chest was performed using the standard protocol during bolus administration of intravenous contrast. Multiplanar CT image reconstructions and MIPs were obtained to evaluate the vascular anatomy. Multidetector CT imaging of the abdomen and pelvis was performed using the standard protocol during bolus administration of intravenous contrast. CONTRAST:  121mL OMNIPAQUE IOHEXOL  350 MG/ML SOLN COMPARISON:  Abdomen and pelvis CT, 04/13/1999 FINDINGS: CTA CHEST FINDINGS Cardiovascular: Opacification of the central pulmonary arteries is satisfactory. There is heterogeneous opacification of the segmental and smaller vessels, also somewhat limited by respiratory motion. Allowing for this limitation, there is no evidence of a pulmonary embolism. Heart is mildly enlarged. No pericardial effusion. Three-vessel coronary artery calcifications. Great vessels are normal in caliber. No aortic dissection. Mild aortic atherosclerosis. Mediastinum/Nodes: No neck base, mediastinal or hilar masses or enlarged lymph nodes. Trachea and esophagus are unremarkable. Lungs/Pleura: Minor subsegmental atelectasis in the dependent lower lobes. No evidence of pneumonia or pulmonary edema. No mass or suspicious nodule. No pleural effusion or pneumothorax. Musculoskeletal: No fracture. No bone lesion. Subcutaneous mass, anterior mid chest consistent with a sebaceous cyst. Review of the MIP images confirms the above findings. CT ABDOMEN and PELVIS FINDINGS Hepatobiliary: Liver normal in size. There is surface nodularity consistent with cirrhosis. No liver mass. Status post cholecystectomy. No bile duct dilation. Pancreas: Unremarkable. No pancreatic ductal dilatation or surrounding inflammatory changes. Spleen: Normal in size without focal abnormality. Adrenals/Urinary Tract: No adrenal masses. Kidneys normal size, orientation and position. Small nonobstructing stones in the left kidney, mid to upper pole. No right intrarenal stones. No renal masses. No hydronephrosis. Normal ureters. Normal bladder. Stomach/Bowel: Normal stomach. Small bowel and colon are normal in caliber. No wall thickening. No inflammation. No evidence of appendicitis. Vascular/Lymphatic: Aortic atherosclerosis. No aneurysm. Prominent gastrohepatic ligament lymph nodes, largest 1 cm short axis, stable. No other adenopathy. Reproductive: Findings  consistent with a hysterectomy. No pelvic masses. Other: No ascites. Musculoskeletal: Previous lumbar spine fusion, L3 through L5. Spine stimulator leads in the posterior lower thoracic spinal canal. No acute fracture. No bone lesion. Review of the MIP images confirms the above findings. IMPRESSION: CTA CHEST 1. Mildly limited study as detailed above. No evidence of a pulmonary embolism. 2. No acute findings in the chest. 3. Coronary artery calcifications and aortic atherosclerosis. CT ABDOMEN AND PELVIS 1. No acute findings within the abdomen or pelvis. 2. Liver morphologic changes consistent with cirrhosis, stable from prior CT. No liver masses. 3. Aortic atherosclerosis. Electronically Signed   By: Lajean Manes M.D.   On: 11/26/2021 19:16   CT ABDOMEN PELVIS W CONTRAST  Result Date: 11/26/2021 CLINICAL DATA:  Patient presents via EMS from home when daughter called EMS due to pt vomiting excessively. Pt denies abdominal pain but c/o right leg pain 8/10. Vomiting began last night and pt is febrile today. Pt has had some diarrhea past few nights. Pt c/o weakness for a few days. Pt was discharged last night from [place] after having surgery for a broken right leg. Pt unaware of being febrile last night at discharge.Pt originally admitted 10/30/2021 when fell. Pt's temp was 102.5 oral via EMS and pt had low SPO2 in 80's prior to oxygen via nasal cannula. Pt baseline sat on presentation on room air 89% and improved to 97% on 2L/min O2 via nasal cannula. Pt normally only wears O2 at night (3L/min). Pt denies SOB. EXAM: CT ANGIOGRAPHY CHEST CT ABDOMEN AND PELVIS WITH CONTRAST TECHNIQUE: Multidetector CT imaging  of the chest was performed using the standard protocol during bolus administration of intravenous contrast. Multiplanar CT image reconstructions and MIPs were obtained to evaluate the vascular anatomy. Multidetector CT imaging of the abdomen and pelvis was performed using the standard protocol during  bolus administration of intravenous contrast. CONTRAST:  1105mL OMNIPAQUE IOHEXOL 350 MG/ML SOLN COMPARISON:  Abdomen and pelvis CT, 04/13/1999 FINDINGS: CTA CHEST FINDINGS Cardiovascular: Opacification of the central pulmonary arteries is satisfactory. There is heterogeneous opacification of the segmental and smaller vessels, also somewhat limited by respiratory motion. Allowing for this limitation, there is no evidence of a pulmonary embolism. Heart is mildly enlarged. No pericardial effusion. Three-vessel coronary artery calcifications. Great vessels are normal in caliber. No aortic dissection. Mild aortic atherosclerosis. Mediastinum/Nodes: No neck base, mediastinal or hilar masses or enlarged lymph nodes. Trachea and esophagus are unremarkable. Lungs/Pleura: Minor subsegmental atelectasis in the dependent lower lobes. No evidence of pneumonia or pulmonary edema. No mass or suspicious nodule. No pleural effusion or pneumothorax. Musculoskeletal: No fracture. No bone lesion. Subcutaneous mass, anterior mid chest consistent with a sebaceous cyst. Review of the MIP images confirms the above findings. CT ABDOMEN and PELVIS FINDINGS Hepatobiliary: Liver normal in size. There is surface nodularity consistent with cirrhosis. No liver mass. Status post cholecystectomy. No bile duct dilation. Pancreas: Unremarkable. No pancreatic ductal dilatation or surrounding inflammatory changes. Spleen: Normal in size without focal abnormality. Adrenals/Urinary Tract: No adrenal masses. Kidneys normal size, orientation and position. Small nonobstructing stones in the left kidney, mid to upper pole. No right intrarenal stones. No renal masses. No hydronephrosis. Normal ureters. Normal bladder. Stomach/Bowel: Normal stomach. Small bowel and colon are normal in caliber. No wall thickening. No inflammation. No evidence of appendicitis. Vascular/Lymphatic: Aortic atherosclerosis. No aneurysm. Prominent gastrohepatic ligament lymph nodes,  largest 1 cm short axis, stable. No other adenopathy. Reproductive: Findings consistent with a hysterectomy. No pelvic masses. Other: No ascites. Musculoskeletal: Previous lumbar spine fusion, L3 through L5. Spine stimulator leads in the posterior lower thoracic spinal canal. No acute fracture. No bone lesion. Review of the MIP images confirms the above findings. IMPRESSION: CTA CHEST 1. Mildly limited study as detailed above. No evidence of a pulmonary embolism. 2. No acute findings in the chest. 3. Coronary artery calcifications and aortic atherosclerosis. CT ABDOMEN AND PELVIS 1. No acute findings within the abdomen or pelvis. 2. Liver morphologic changes consistent with cirrhosis, stable from prior CT. No liver masses. 3. Aortic atherosclerosis. Electronically Signed   By: Lajean Manes M.D.   On: 11/26/2021 19:16   CT TIBIA FIBULA RIGHT W CONTRAST  Result Date: 11/26/2021 CLINICAL DATA:  Recent surgical intervention for a broken right leg, presenting with fever and right lower extremity pain and swelling. EXAM: CT OF THE LOWER RIGHT EXTREMITY WITH CONTRAST TECHNIQUE: Multidetector CT imaging of the lower right extremity was performed according to the standard protocol following intravenous contrast administration. CONTRAST:  120mL OMNIPAQUE IOHEXOL 350 MG/ML SOLN COMPARISON:  None. FINDINGS: Bones/Joint/Cartilage A metallic density intramedullary rod is seen extending through the length of the right tibia. Proximal and distal metallic density fixation screws are also noted. Surgically repaired, acute, nondisplaced fractures of the proximal and distal portions of the right tibial shaft are also seen. An acute, nondisplaced fracture of the neck of the proximal right fibula is seen. There is a chronic fracture deformity of the shaft of the distal right fibula, just above the level of the right lateral malleolus. There is no evidence of dislocation. Marked severity degenerative changes  are seen within the right  knee. Ligaments Suboptimally assessed by CT. Muscles and Tendons Unremarkable. Soft tissues A small joint effusion is seen within the visualized portion of the right knee. Areas of moderate to marked severity subcutaneous and para muscular inflammatory fat stranding are seen along the posterior, lateral and anteromedial aspects of the right leg below the level of the knee. There is no evidence of associated fluid collection or abscess. A 2.1 cm x 0.8 cm superficial soft tissue defect is seen adjacent to the medial aspect of the distal right tibial shaft (axial CT images 231 through 239, CT series 9). Adjacent subcutaneous inflammatory fat stranding and edema is noted which extends to the cortex of the tibia. No associated cortical destruction is seen. IMPRESSION: 1. Moderate to marked severity diffuse cellulitis, most prominent along the posterior, lateral and anteromedial aspects of the soft tissues below the right knee. 2. Focal soft tissue defect, and associated inflammatory changes, along the medial aspect of the distal right lower extremity without evidence of acute osteomyelitis. 3. Status post ORIF of the right tibia. 4. Acute nondisplaced fracture of the proximal right fibula. 5. Small right knee effusion. 6. Marked severity degenerative changes within the right knee. Electronically Signed   By: Virgina Norfolk M.D.   On: 11/26/2021 19:36   DG Chest Portable 1 View  Result Date: 11/26/2021 CLINICAL DATA:  Hypoxia fever EXAM: PORTABLE CHEST 1 VIEW COMPARISON:  07/10/2021 FINDINGS: Cardiomegaly with vascular congestion. No focal consolidation, pleural effusion, or pneumothorax. Aortic atherosclerosis. IMPRESSION: Cardiomegaly with vascular congestion Electronically Signed   By: Donavan Foil M.D.   On: 11/26/2021 15:56    Labs on Admission: I have personally reviewed following labs  CBC: Recent Labs  Lab 11/26/21 1515  WBC 9.3  NEUTROABS 7.6  HGB 8.9*  HCT 28.8*  MCV 89.2  PLT 952   Basic  Metabolic Panel: Recent Labs  Lab 11/26/21 1515  NA 130*  K 3.9  CL 92*  CO2 33*  GLUCOSE 274*  BUN 15  CREATININE 0.75  CALCIUM 11.4*   GFR: Estimated Creatinine Clearance: 99.5 mL/min (by C-G formula based on SCr of 0.75 mg/dL).  Urine analysis:    Component Value Date/Time   COLORURINE YELLOW (A) 11/26/2021 2020   APPEARANCEUR HAZY (A) 11/26/2021 2020   APPEARANCEUR Clear 10/22/2014 1539   LABSPEC 1.036 (H) 11/26/2021 2020   LABSPEC 1.010 10/22/2014 1539   PHURINE 6.0 11/26/2021 2020   GLUCOSEU 50 (A) 11/26/2021 2020   GLUCOSEU 150 mg/dL 10/22/2014 1539   HGBUR NEGATIVE 11/26/2021 2020   BILIRUBINUR NEGATIVE 11/26/2021 2020   BILIRUBINUR Negative 10/22/2014 [place] 11/26/2021 2020   PROTEINUR 30 (A) 11/26/2021 2020   UROBILINOGEN 0.2 07/03/2009 1105   NITRITE NEGATIVE 11/26/2021 2020   LEUKOCYTESUR NEGATIVE 11/26/2021 2020   LEUKOCYTESUR Negative 10/22/2014 1539   Dr. Tobie Poet [facility]  If 7PM-7AM, please contact overnight-coverage provider If 7AM-7PM, please contact day coverage provider [email or web address]  11/26/2021, 11:14 PM

## 2021-11-26 NOTE — ED Notes (Signed)
Per daughter, pt seen at pain clinic here - receives Oxycodone 10 mg twice daily.

## 2021-11-26 NOTE — ED Notes (Signed)
Pt had urinated on self. Cleaned with wipes, placed purewick and placed diapers underneath. Cleaned with wipes and dried. Removed previous sacral dressing from [facility] hospital that patient was sent home with.

## 2021-11-26 NOTE — Consult Note (Addendum)
Reason for Consult: Right leg infection Referring Physician: Dr. Luellen Pucker is an 70 y.o. female.  HPI: Patient is a 70 year old who was released yesterday and she last evening from [facility].  She went home and was supposed to have been given a lift did not have that having on nausea and had a miserable night.  She had increasing fever and not feeling well and so came to our emergency room today.  On 11 4 she had a fall suffering a open grade 3 distal tib-fib fracture as well as proximal tibia fracture.  This was treated with urgent I&D wound VAC application and tibial rodding at [facility].  She stayed in the hospital additional 26 days for care she had significant skin loss over the distal medial leg with a large incision present visible now with dressing care to the medial leg.  Now she is having more pain up by the knee with severe arthritis being present previously having swelling and warmth with presumed cellulitis based on CT scan.  Past Medical History:  Diagnosis Date   Abdominal pain, left upper quadrant 12/11/2012   Anemia    Arthritis    Asthma    Broken leg 2014   fell twice and broke same leg. had a bone stimulator for first break.   Cancer ([place]) 2007   melanoma, left ear and back of left leg, removed from back   Chronic kidney disease, stage 2 (mild)    Chronic lower back pain    Collagen vascular disease (HCC)    Complication of anesthesia    slow to wake, coughing   Coronary artery disease    Diabetes mellitus without complication ([place])    Diabetic nephropathy associated with secondary diabetes mellitus ([place])    Flushing 12/11/2012   Gastroparesis    Gross hematuria 12/11/2012   Hepatic cirrhosis ([place])    History of kidney stones    Hyperlipidemia    Hypertension    Hypothyroidism    Hypothyroidism    IBS (irritable bowel syndrome)    Kidney stone 12/11/2012   Lower extremity edema    Morbid obesity with BMI of 40.0-44.9, adult (HCC)    Motion sickness     back seat of cars   MRSA (methicillin resistant staph aureus) culture positive 2016   Myocardial infarction ([place]) 09/2013   Nausea without vomiting 12/11/2012   Nephrolithiasis    Nephrolithiasis 04/26/2014   Nonproliferative retinopathy due to secondary diabetes (HCC)    Numbness and tingling of right leg    Peripheral vascular disease ([place])    Renal colic 53/66/4403   Renal insufficiency    Sciatica    Sciatica 12/11/2012   Skin cancer 08/2018   Sleep apnea    USES CPAP   Spinal cord stimulator status    battery in left buttock   Stroke ([place]) 07/2020   Unilateral small kidney without contralateral hypertrophy    Wears dentures    full lower   Wheelchair dependent    reports able to transfer with assist    Past Surgical History:  Procedure Laterality Date   ABDOMINAL HYSTERECTOMY     BACK SURGERY  04/1999   L-4-5 LAMINECTOMY. metal in lower back and neck   CATARACT EXTRACTION W/PHACO Right 09/17/2021   Procedure: CATARACT EXTRACTION PHACO AND INTRAOCULAR LENS PLACEMENT ([place]) RIGHT DIABETIC;  Surgeon: Leandrew Koyanagi, MD;  Location: [facility] ORS;  Service: Ophthalmology;  Laterality: Right;  5.64 0:58.9   [place]  CHOLECYSTECTOMY     COLONOSCOPY  05/04/2001   COLONOSCOPY WITH PROPOFOL N/A 09/20/2016   Procedure: COLONOSCOPY WITH PROPOFOL;  Surgeon: Lollie Sails, MD;  Location: [facility] ENDOSCOPY;  Service: Endoscopy;  Laterality: N/A;   DILATION AND CURETTAGE OF UTERUS     ESOPHAGOGASTRODUODENOSCOPY  02/08/2014   ESOPHAGOGASTRODUODENOSCOPY (EGD) WITH PROPOFOL N/A 09/20/2016   Procedure: ESOPHAGOGASTRODUODENOSCOPY (EGD) WITH PROPOFOL;  Surgeon: Lollie Sails, MD;  Location: [facility] ENDOSCOPY;  Service: Endoscopy;  Laterality: N/A;   ESOPHAGOGASTRODUODENOSCOPY (EGD) WITH PROPOFOL N/A 05/23/2018   Procedure: ESOPHAGOGASTRODUODENOSCOPY (EGD) WITH PROPOFOL;  Surgeon: Lollie Sails, MD;  Location: [facility] ENDOSCOPY;  Service: Endoscopy;  Laterality: N/A;    ESOPHAGOGASTRODUODENOSCOPY (EGD) WITH PROPOFOL N/A 07/18/2020   Procedure: ESOPHAGOGASTRODUODENOSCOPY (EGD) WITH PROPOFOL;  Surgeon: Lesly Rubenstein, MD;  Location: [facility] ENDOSCOPY;  Service: Gastroenterology;  Laterality: N/A;   EYE SURGERY  1986   FOR MELANOMA   HERNIA REPAIR     JOINT REPLACEMENT Left 09/24/2013   TOTAL KNEE   MOHS SURGERY  11/2016   left side of nose   PULSE GENERATOR IMPLANT N/A 11/02/2017   Procedure: UNILATERAL PULSE GENERATOR IMPLANT;  Surgeon: Meade Maw, MD;  Location: [facility] ORS;  Service: Neurosurgery;  Laterality: N/A;   TONSILLECTOMY     WITH ADENOIDECTOMY    Family History  Problem Relation Age of Onset   Heart disease Mother    Cancer Mother    Asthma Mother    Diabetes Father    Kidney disease Father    Hypertension Father    Breast cancer Neg Hx     Social History:  reports that she has never smoked. She has never used smokeless tobacco. She reports that she does not drink alcohol and does not use drugs.  Allergies:  Allergies  Allergen Reactions   Shellfish Allergy Nausea And Vomiting, Diarrhea and Nausea Only    Non stop sickness once ingests shellfish. Betadine is okay   Cephalexin Other (See Comments)    Doesn't digest it well   Amlodipine Cough   Codeine Nausea And Vomiting and Nausea Only    states she can take codeine cough syrup without problems states she can take codeine cough syrup without problems   Imdur [Isosorbide Dinitrate] Other (See Comments) and Cough    Headache   Lyrica [Pregabalin] Swelling and Other (See Comments)    "FLU-LIKE" SYMPTOMS   Monosodium Glutamate Other (See Comments) and Cough    Headache   Tape Other (See Comments) and Nausea And Vomiting    can use paper tape Redness    Medications: I have reviewed the patient's current medications.  Results for orders placed or performed during the hospital encounter of 11/26/21 (from the past 48 hour(s))  CBC with Differential     Status: Abnormal    Collection Time: 11/26/21  3:15 PM  Result Value Ref Range   WBC 9.3 4.0 - 10.5 K/uL   RBC 3.23 (L) 3.87 - 5.11 MIL/uL   Hemoglobin 8.9 (L) 12.0 - 15.0 g/dL   HCT 28.8 (L) 36.0 - 46.0 %   MCV 89.2 80.0 - 100.0 fL   MCH 27.6 26.0 - 34.0 pg   MCHC 30.9 30.0 - 36.0 g/dL   RDW 15.2 11.5 - 15.5 %   Platelets 354 150 - 400 K/uL   nRBC 0.0 0.0 - 0.2 %   Neutrophils Relative % 81 %   Neutro Abs 7.6 1.7 - 7.7 K/uL   Lymphocytes Relative 9 %   Lymphs Abs  0.8 0.7 - 4.0 K/uL   Monocytes Relative 9 %   Monocytes Absolute 0.8 0.1 - 1.0 K/uL   Eosinophils Relative 0 %   Eosinophils Absolute 0.0 0.0 - 0.5 K/uL   Basophils Relative 0 %   Basophils Absolute 0.0 0.0 - 0.1 K/uL   Immature Granulocytes 1 %   Abs Immature Granulocytes 0.05 0.00 - 0.07 K/uL    Comment: Performed at [facility], [address] [place], [place] [address]  Basic metabolic panel     Status: Abnormal   Collection Time: 11/26/21  3:15 PM  Result Value Ref Range   Sodium 130 (L) 135 - 145 mmol/L   Potassium 3.9 3.5 - 5.1 mmol/L   Chloride 92 (L) 98 - 111 mmol/L   CO2 33 (H) 22 - 32 mmol/L   Glucose, Bld 274 (H) 70 - 99 mg/dL    Comment: Glucose reference range applies only to samples taken after fasting for at least 8 hours.   BUN 15 8 - 23 mg/dL   Creatinine, Ser 0.75 0.44 - 1.00 mg/dL   Calcium 11.4 (H) 8.9 - 10.3 mg/dL   GFR, Estimated >60 >60 mL/min    Comment: (NOTE) Calculated using the CKD-EPI Creatinine Equation (2021)    Anion gap 5 5 - 15    Comment: Performed at [facility], [address] [place], [place] [address]  Lactic acid, plasma     Status: None   Collection Time: 11/26/21  3:15 PM  Result Value Ref Range   Lactic Acid, Venous 1.1 0.5 - 1.9 mmol/L    Comment: Performed at [facility], [address], [place], [place] [address]  Resp Panel by RT-PCR (Flu A&B, Covid) Nasopharyngeal Swab     Status: None   Collection Time: 11/26/21  5:03 PM   Specimen:  Nasopharyngeal Swab; Nasopharyngeal(NP) swabs in vial transport medium  Result Value Ref Range   SARS Coronavirus 2 by RT PCR NEGATIVE NEGATIVE    Comment: (NOTE) SARS-CoV-2 target nucleic acids are NOT DETECTED.  The SARS-CoV-2 RNA is generally detectable in upper respiratory specimens during the acute phase of infection. The lowest concentration of SARS-CoV-2 viral copies this assay can detect is 138 copies/mL. A negative result does not preclude SARS-Cov-2 infection and should not be used as the sole basis for treatment or other patient management decisions. A negative result may occur with  improper specimen collection/handling, submission of specimen other than nasopharyngeal swab, presence of viral mutation(s) within the areas targeted by this assay, and inadequate number of viral copies(<138 copies/mL). A negative result must be combined with clinical observations, patient history, and epidemiological information. The expected result is Negative.  Fact Sheet for Patients:  EntrepreneurPulse.com.au  Fact Sheet for Healthcare Providers:  IncredibleEmployment.be  This test is no t yet approved or cleared by the [place] FDA and  has been authorized for detection and/or diagnosis of SARS-CoV-2 by FDA under an Emergency Use Authorization (EUA). This EUA will remain  in effect (meaning this test can be used) for the duration of the COVID-19 declaration under Section 564(b)(1) of the Act, 21 U.S.C.section 360bbb-3(b)(1), unless the authorization is terminated  or revoked sooner.       Influenza A by PCR NEGATIVE NEGATIVE   Influenza B by PCR NEGATIVE NEGATIVE    Comment: (NOTE) The Xpert Xpress SARS-CoV-2/FLU/RSV plus assay is intended as an aid in the diagnosis of influenza from Nasopharyngeal swab specimens and should not be used as a sole  basis for treatment. Nasal washings and aspirates are unacceptable for Xpert Xpress  SARS-CoV-2/FLU/RSV testing.  Fact Sheet for Patients: EntrepreneurPulse.com.au  Fact Sheet for Healthcare Providers: IncredibleEmployment.be  This test is not yet approved or cleared by the [place] FDA and has been authorized for detection and/or diagnosis of SARS-CoV-2 by FDA under an Emergency Use Authorization (EUA). This EUA will remain in effect (meaning this test can be used) for the duration of the COVID-19 declaration under Section 564(b)(1) of the Act, 21 U.S.C. section 360bbb-3(b)(1), unless the authorization is terminated or revoked.  Performed at [facility], [address] [place], [place] [address]   Lactic acid, plasma     Status: None   Collection Time: 11/26/21  8:20 PM  Result Value Ref Range   Lactic Acid, Venous 0.9 0.5 - 1.9 mmol/L    Comment: Performed at [facility], [address] [place], [place] [address]  Urinalysis, Complete w Microscopic     Status: Abnormal   Collection Time: 11/26/21  8:20 PM  Result Value Ref Range   Color, Urine YELLOW (A) YELLOW   APPearance HAZY (A) CLEAR   Specific Gravity, Urine 1.036 (H) 1.005 - 1.030   pH 6.0 5.0 - 8.0   Glucose, UA 50 (A) NEGATIVE mg/dL   Hgb urine dipstick NEGATIVE NEGATIVE   Bilirubin Urine NEGATIVE NEGATIVE   Ketones, ur NEGATIVE NEGATIVE mg/dL   Protein, ur 30 (A) NEGATIVE mg/dL   Nitrite NEGATIVE NEGATIVE   Leukocytes,Ua NEGATIVE NEGATIVE   RBC / HPF 6-10 0 - 5 RBC/hpf   WBC, UA 6-10 0 - 5 WBC/hpf   Bacteria, UA RARE (A) NONE SEEN   Squamous Epithelial / LPF 0-5 0 - 5   Mucus PRESENT     Comment: Performed at [facility], [address] [place], [place] [address]  CBG monitoring, ED     Status: Abnormal   Collection Time: 11/26/21  9:54 PM  Result Value Ref Range   Glucose-Capillary 179 (H) 70 - 99 mg/dL    Comment: Glucose reference range applies only to samples taken after fasting for at least 8 hours.    CT  Angio Chest PE W and/or Wo Contrast  Result Date: 11/26/2021 CLINICAL DATA:  Patient presents via EMS from home when daughter called EMS due to pt vomiting excessively. Pt denies abdominal pain but c/o right leg pain 8/10. Vomiting began last night and pt is febrile today. Pt has had some diarrhea past few nights. Pt c/o weakness for a few days. Pt was discharged last night from [place] after having surgery for a broken right leg. Pt unaware of being febrile last night at discharge.Pt originally admitted 10/30/2021 when fell. Pt's temp was 102.5 oral via EMS and pt had low SPO2 in 80's prior to oxygen via nasal cannula. Pt baseline sat on presentation on room air 89% and improved to 97% on 2L/min O2 via nasal cannula. Pt normally only wears O2 at night (3L/min). Pt denies SOB. EXAM: CT ANGIOGRAPHY CHEST CT ABDOMEN AND PELVIS WITH CONTRAST TECHNIQUE: Multidetector CT imaging of the chest was performed using the standard protocol during bolus administration of intravenous contrast. Multiplanar CT image reconstructions and MIPs were obtained to evaluate the vascular anatomy. Multidetector CT imaging of the abdomen and pelvis was performed using the standard protocol during bolus administration of intravenous contrast. CONTRAST:  17mL OMNIPAQUE IOHEXOL 350 MG/ML SOLN COMPARISON:  Abdomen and pelvis CT, 04/13/1999 FINDINGS: CTA CHEST FINDINGS Cardiovascular: Opacification of  the central pulmonary arteries is satisfactory. There is heterogeneous opacification of the segmental and smaller vessels, also somewhat limited by respiratory motion. Allowing for this limitation, there is no evidence of a pulmonary embolism. Heart is mildly enlarged. No pericardial effusion. Three-vessel coronary artery calcifications. Great vessels are normal in caliber. No aortic dissection. Mild aortic atherosclerosis. Mediastinum/Nodes: No neck base, mediastinal or hilar masses or enlarged lymph nodes. Trachea and esophagus are  unremarkable. Lungs/Pleura: Minor subsegmental atelectasis in the dependent lower lobes. No evidence of pneumonia or pulmonary edema. No mass or suspicious nodule. No pleural effusion or pneumothorax. Musculoskeletal: No fracture. No bone lesion. Subcutaneous mass, anterior mid chest consistent with a sebaceous cyst. Review of the MIP images confirms the above findings. CT ABDOMEN and PELVIS FINDINGS Hepatobiliary: Liver normal in size. There is surface nodularity consistent with cirrhosis. No liver mass. Status post cholecystectomy. No bile duct dilation. Pancreas: Unremarkable. No pancreatic ductal dilatation or surrounding inflammatory changes. Spleen: Normal in size without focal abnormality. Adrenals/Urinary Tract: No adrenal masses. Kidneys normal size, orientation and position. Small nonobstructing stones in the left kidney, mid to upper pole. No right intrarenal stones. No renal masses. No hydronephrosis. Normal ureters. Normal bladder. Stomach/Bowel: Normal stomach. Small bowel and colon are normal in caliber. No wall thickening. No inflammation. No evidence of appendicitis. Vascular/Lymphatic: Aortic atherosclerosis. No aneurysm. Prominent gastrohepatic ligament lymph nodes, largest 1 cm short axis, stable. No other adenopathy. Reproductive: Findings consistent with a hysterectomy. No pelvic masses. Other: No ascites. Musculoskeletal: Previous lumbar spine fusion, L3 through L5. Spine stimulator leads in the posterior lower thoracic spinal canal. No acute fracture. No bone lesion. Review of the MIP images confirms the above findings. IMPRESSION: CTA CHEST 1. Mildly limited study as detailed above. No evidence of a pulmonary embolism. 2. No acute findings in the chest. 3. Coronary artery calcifications and aortic atherosclerosis. CT ABDOMEN AND PELVIS 1. No acute findings within the abdomen or pelvis. 2. Liver morphologic changes consistent with cirrhosis, stable from prior CT. No liver masses. 3. Aortic  atherosclerosis. Electronically Signed   By: Lajean Manes M.D.   On: 11/26/2021 19:16   CT ABDOMEN PELVIS W CONTRAST  Result Date: 11/26/2021 CLINICAL DATA:  Patient presents via EMS from home when daughter called EMS due to pt vomiting excessively. Pt denies abdominal pain but c/o right leg pain 8/10. Vomiting began last night and pt is febrile today. Pt has had some diarrhea past few nights. Pt c/o weakness for a few days. Pt was discharged last night from [place] after having surgery for a broken right leg. Pt unaware of being febrile last night at discharge.Pt originally admitted 10/30/2021 when fell. Pt's temp was 102.5 oral via EMS and pt had low SPO2 in 80's prior to oxygen via nasal cannula. Pt baseline sat on presentation on room air 89% and improved to 97% on 2L/min O2 via nasal cannula. Pt normally only wears O2 at night (3L/min). Pt denies SOB. EXAM: CT ANGIOGRAPHY CHEST CT ABDOMEN AND PELVIS WITH CONTRAST TECHNIQUE: Multidetector CT imaging of the chest was performed using the standard protocol during bolus administration of intravenous contrast. Multiplanar CT image reconstructions and MIPs were obtained to evaluate the vascular anatomy. Multidetector CT imaging of the abdomen and pelvis was performed using the standard protocol during bolus administration of intravenous contrast. CONTRAST:  152mL OMNIPAQUE IOHEXOL 350 MG/ML SOLN COMPARISON:  Abdomen and pelvis CT, 04/13/1999 FINDINGS: CTA CHEST FINDINGS Cardiovascular: Opacification of the central pulmonary arteries is satisfactory. There is heterogeneous  opacification of the segmental and smaller vessels, also somewhat limited by respiratory motion. Allowing for this limitation, there is no evidence of a pulmonary embolism. Heart is mildly enlarged. No pericardial effusion. Three-vessel coronary artery calcifications. Great vessels are normal in caliber. No aortic dissection. Mild aortic atherosclerosis. Mediastinum/Nodes: No neck base,  mediastinal or hilar masses or enlarged lymph nodes. Trachea and esophagus are unremarkable. Lungs/Pleura: Minor subsegmental atelectasis in the dependent lower lobes. No evidence of pneumonia or pulmonary edema. No mass or suspicious nodule. No pleural effusion or pneumothorax. Musculoskeletal: No fracture. No bone lesion. Subcutaneous mass, anterior mid chest consistent with a sebaceous cyst. Review of the MIP images confirms the above findings. CT ABDOMEN and PELVIS FINDINGS Hepatobiliary: Liver normal in size. There is surface nodularity consistent with cirrhosis. No liver mass. Status post cholecystectomy. No bile duct dilation. Pancreas: Unremarkable. No pancreatic ductal dilatation or surrounding inflammatory changes. Spleen: Normal in size without focal abnormality. Adrenals/Urinary Tract: No adrenal masses. Kidneys normal size, orientation and position. Small nonobstructing stones in the left kidney, mid to upper pole. No right intrarenal stones. No renal masses. No hydronephrosis. Normal ureters. Normal bladder. Stomach/Bowel: Normal stomach. Small bowel and colon are normal in caliber. No wall thickening. No inflammation. No evidence of appendicitis. Vascular/Lymphatic: Aortic atherosclerosis. No aneurysm. Prominent gastrohepatic ligament lymph nodes, largest 1 cm short axis, stable. No other adenopathy. Reproductive: Findings consistent with a hysterectomy. No pelvic masses. Other: No ascites. Musculoskeletal: Previous lumbar spine fusion, L3 through L5. Spine stimulator leads in the posterior lower thoracic spinal canal. No acute fracture. No bone lesion. Review of the MIP images confirms the above findings. IMPRESSION: CTA CHEST 1. Mildly limited study as detailed above. No evidence of a pulmonary embolism. 2. No acute findings in the chest. 3. Coronary artery calcifications and aortic atherosclerosis. CT ABDOMEN AND PELVIS 1. No acute findings within the abdomen or pelvis. 2. Liver morphologic changes  consistent with cirrhosis, stable from prior CT. No liver masses. 3. Aortic atherosclerosis. Electronically Signed   By: Lajean Manes M.D.   On: 11/26/2021 19:16   CT TIBIA FIBULA RIGHT W CONTRAST  Result Date: 11/26/2021 CLINICAL DATA:  Recent surgical intervention for a broken right leg, presenting with fever and right lower extremity pain and swelling. EXAM: CT OF THE LOWER RIGHT EXTREMITY WITH CONTRAST TECHNIQUE: Multidetector CT imaging of the lower right extremity was performed according to the standard protocol following intravenous contrast administration. CONTRAST:  153mL OMNIPAQUE IOHEXOL 350 MG/ML SOLN COMPARISON:  None. FINDINGS: Bones/Joint/Cartilage A metallic density intramedullary rod is seen extending through the length of the right tibia. Proximal and distal metallic density fixation screws are also noted. Surgically repaired, acute, nondisplaced fractures of the proximal and distal portions of the right tibial shaft are also seen. An acute, nondisplaced fracture of the neck of the proximal right fibula is seen. There is a chronic fracture deformity of the shaft of the distal right fibula, just above the level of the right lateral malleolus. There is no evidence of dislocation. Marked severity degenerative changes are seen within the right knee. Ligaments Suboptimally assessed by CT. Muscles and Tendons Unremarkable. Soft tissues A small joint effusion is seen within the visualized portion of the right knee. Areas of moderate to marked severity subcutaneous and para muscular inflammatory fat stranding are seen along the posterior, lateral and anteromedial aspects of the right leg below the level of the knee. There is no evidence of associated fluid collection or abscess. A 2.1 cm x 0.8 cm  superficial soft tissue defect is seen adjacent to the medial aspect of the distal right tibial shaft (axial CT images 231 through 239, CT series 9). Adjacent subcutaneous inflammatory fat stranding and edema  is noted which extends to the cortex of the tibia. No associated cortical destruction is seen. IMPRESSION: 1. Moderate to marked severity diffuse cellulitis, most prominent along the posterior, lateral and anteromedial aspects of the soft tissues below the right knee. 2. Focal soft tissue defect, and associated inflammatory changes, along the medial aspect of the distal right lower extremity without evidence of acute osteomyelitis. 3. Status post ORIF of the right tibia. 4. Acute nondisplaced fracture of the proximal right fibula. 5. Small right knee effusion. 6. Marked severity degenerative changes within the right knee. Electronically Signed   By: Virgina Norfolk M.D.   On: 11/26/2021 19:36   DG Chest Portable 1 View  Result Date: 11/26/2021 CLINICAL DATA:  Hypoxia fever EXAM: PORTABLE CHEST 1 VIEW COMPARISON:  07/10/2021 FINDINGS: Cardiomegaly with vascular congestion. No focal consolidation, pleural effusion, or pneumothorax. Aortic atherosclerosis. IMPRESSION: Cardiomegaly with vascular congestion Electronically Signed   By: Donavan Foil M.D.   On: 11/26/2021 15:56    Review of Systems Blood pressure (!) 159/60, pulse 84, temperature (!) 100.7 F (38.2 C), temperature source Oral, resp. rate 19, height 5\' 6"  (1.676 m), weight (!) 145 kg, SpO2 96 %. Physical Exam She has approximately 8 cm area of granulation tissue over the medial distal tibia approximately 10 cm oblique incision with sutures anterior distal tibia prior incision is healed proximally from interlocking screws and rod insertion with warmth to the posterior calf proximally pain with any knee motion, no apparent large effusion. Assessment/Plan: Probable cellulitis after a grade 3 open tibia fracture treated with IM rod 4 weeks ago.  She will require IV antibiotics and but if that she does not respond I am not sure this can be handled surgically here as she has a complex trauma case.  I did discuss with her the potential in this type  of severe leg trauma that there is potential for above-knee amputation.  Hessie Knows 11/26/2021, 10:21 PM

## 2021-11-26 NOTE — Progress Notes (Signed)
PHARMACIST - PHYSICIAN COMMUNICATION  CONCERNING:  Enoxaparin (Lovenox) for DVT Prophylaxis    RECOMMENDATION: Patient was prescribed enoxaprin 40mg  q24 hours for VTE prophylaxis.   Filed Weights   11/26/21 1518  Weight: (!) 145 kg (319 lb 10.7 oz)    Body mass index is 51.6 kg/m.  Estimated Creatinine Clearance: 99.5 mL/min (by C-G formula based on SCr of 0.75 mg/dL).   Based on [place] patient is candidate for enoxaparin 0.5mg /kg TBW SQ every 24 hours based on BMI being >30.  DESCRIPTION: Pharmacy has adjusted enoxaparin dose per [facility] policy.  Patient is now receiving enoxaparin 0.5 mg/kg every 24 hours   Renda Rolls, PharmD, [facility] 11/26/2021 11:38 PM

## 2021-11-26 NOTE — Progress Notes (Signed)
Pharmacy Antibiotic Note  Brittney [name] is a 70 y.o. female admitted on 11/26/2021 with cellulitis.  She has a recent h/o  prolonged hospitalization after initial admission roughly 1 month ago for open right tib/fib fracture. Pharmacy has been consulted for vancomycin and cefepime dosing. In the ED she received 1 gram IV vancomycin.  Plan:  1) start cefepime 2 grams IV every 12 hours  2) will give an additional vancomycin 1500 mg IV x 1 to equal total loading dose of 2500 mg followed by 2000 mg IV every 24 hours  Goal AUC 400-550 Expected AUC: 486.6 SCr used: 0.80 mg/dL (rounded up) Ke: 0.057 h-1, T1/2: 12.2h Daily renal function assessment while on IV vancomycin   Height: 5\' 6"  (167.6 cm) Weight: (!) 145 kg (319 lb 10.7 oz) IBW/kg (Calculated) : 59.3  Temp (24hrs), Avg:101.6 F (38.7 C), Min:100.7 F (38.2 C), Max:103.3 F (39.6 C)  Recent Labs  Lab 11/26/21 1515 11/26/21 2020  WBC 9.3  --   CREATININE 0.75  --   LATICACIDVEN 1.1 0.9    Estimated Creatinine Clearance: 99.5 mL/min (by C-G formula based on SCr of 0.75 mg/dL).    Allergies  Allergen Reactions   Shellfish Allergy Nausea And Vomiting, Diarrhea and Nausea Only    Non stop sickness once ingests shellfish. Betadine is okay   Cephalexin Other (See Comments)    Doesn't digest it well   Amlodipine Cough   Codeine Nausea And Vomiting and Nausea Only    states she can take codeine cough syrup without problems states she can take codeine cough syrup without problems   Imdur [Isosorbide Dinitrate] Other (See Comments) and Cough    Headache   Lyrica [Pregabalin] Swelling and Other (See Comments)    "FLU-LIKE" SYMPTOMS   Monosodium Glutamate Other (See Comments) and Cough    Headache   Tape Other (See Comments) and Nausea And Vomiting    can use paper tape Redness    Antimicrobials this admission: 12/01 vancomycin >>  12/01 cefepime >>   Microbiology results: 12/01 BCx: pending 12/01 SARS CoV-2:  negative 12/01 influenza A/B: negative  Thank you for allowing pharmacy to be a part of this patient's care.  Dallie Piles 11/26/2021 9:41 PM

## 2021-11-26 NOTE — ED Notes (Addendum)
Pt to CT

## 2021-11-26 NOTE — ED Triage Notes (Addendum)
Patient presents via EMS from home when daughter called EMS due to pt vomiting excessively. Pt denies abdominal pain but c/o right leg pain 8/10. Vomiting began last night and pt is febrile today. Pt has had some diarrhea past few nights. Pt c/o weakness for a few days.  Pt was discharged last night from [place] after having surgery for a broken right leg. Pt unaware of being febrile last night at discharge.Pt originally admitted 10/30/2021 when fell. Pt's temp was 102.5 oral via EMS and pt had low SPO2 in 80's prior to oxygen via nasal cannula. Pt baseline sat on presentation on room air 89% and improved to 97% on 2L/min O2 via nasal cannula. Pt normally only wears O2 at night (3L/min). Pt denies SOB.  Pt Admin 4 mg Zofran via EMS.  Pt has ulcer on left leg and reports right leg seems more painful, swollen and red- pain described as throbbing.  Per pt reports on Synthroid and Levothyroxine for parathyroidism.

## 2021-11-27 DIAGNOSIS — S82201F Unspecified fracture of shaft of right tibia, subsequent encounter for open fracture type IIIA, IIIB, or IIIC with routine healing: Secondary | ICD-10-CM

## 2021-11-27 DIAGNOSIS — L03115 Cellulitis of right lower limb: Secondary | ICD-10-CM | POA: Diagnosis present

## 2021-11-27 DIAGNOSIS — F32A Depression, unspecified: Secondary | ICD-10-CM | POA: Diagnosis present

## 2021-11-27 DIAGNOSIS — Z20822 Contact with and (suspected) exposure to covid-19: Secondary | ICD-10-CM | POA: Diagnosis present

## 2021-11-27 DIAGNOSIS — I700 Atherosclerosis of aorta: Secondary | ICD-10-CM | POA: Diagnosis present

## 2021-11-27 DIAGNOSIS — D509 Iron deficiency anemia, unspecified: Secondary | ICD-10-CM | POA: Diagnosis present

## 2021-11-27 DIAGNOSIS — K7460 Unspecified cirrhosis of liver: Secondary | ICD-10-CM | POA: Diagnosis present

## 2021-11-27 DIAGNOSIS — R112 Nausea with vomiting, unspecified: Secondary | ICD-10-CM

## 2021-11-27 DIAGNOSIS — G4700 Insomnia, unspecified: Secondary | ICD-10-CM | POA: Diagnosis present

## 2021-11-27 DIAGNOSIS — E1122 Type 2 diabetes mellitus with diabetic chronic kidney disease: Secondary | ICD-10-CM | POA: Diagnosis present

## 2021-11-27 DIAGNOSIS — N182 Chronic kidney disease, stage 2 (mild): Secondary | ICD-10-CM | POA: Diagnosis present

## 2021-11-27 DIAGNOSIS — E871 Hypo-osmolality and hyponatremia: Secondary | ICD-10-CM | POA: Diagnosis present

## 2021-11-27 DIAGNOSIS — S81801A Unspecified open wound, right lower leg, initial encounter: Secondary | ICD-10-CM | POA: Diagnosis not present

## 2021-11-27 DIAGNOSIS — E119 Type 2 diabetes mellitus without complications: Secondary | ICD-10-CM | POA: Diagnosis not present

## 2021-11-27 DIAGNOSIS — Z6841 Body Mass Index (BMI) 40.0 and over, adult: Secondary | ICD-10-CM | POA: Diagnosis not present

## 2021-11-27 DIAGNOSIS — A419 Sepsis, unspecified organism: Secondary | ICD-10-CM | POA: Diagnosis present

## 2021-11-27 DIAGNOSIS — E039 Hypothyroidism, unspecified: Secondary | ICD-10-CM | POA: Diagnosis present

## 2021-11-27 DIAGNOSIS — I10 Essential (primary) hypertension: Secondary | ICD-10-CM | POA: Diagnosis not present

## 2021-11-27 DIAGNOSIS — E785 Hyperlipidemia, unspecified: Secondary | ICD-10-CM | POA: Diagnosis present

## 2021-11-27 DIAGNOSIS — F419 Anxiety disorder, unspecified: Secondary | ICD-10-CM

## 2021-11-27 DIAGNOSIS — E1143 Type 2 diabetes mellitus with diabetic autonomic (poly)neuropathy: Secondary | ICD-10-CM | POA: Diagnosis present

## 2021-11-27 DIAGNOSIS — E1151 Type 2 diabetes mellitus with diabetic peripheral angiopathy without gangrene: Secondary | ICD-10-CM | POA: Diagnosis present

## 2021-11-27 DIAGNOSIS — D631 Anemia in chronic kidney disease: Secondary | ICD-10-CM | POA: Diagnosis present

## 2021-11-27 DIAGNOSIS — L0390 Cellulitis, unspecified: Secondary | ICD-10-CM | POA: Diagnosis present

## 2021-11-27 DIAGNOSIS — G4733 Obstructive sleep apnea (adult) (pediatric): Secondary | ICD-10-CM | POA: Diagnosis present

## 2021-11-27 DIAGNOSIS — R262 Difficulty in walking, not elsewhere classified: Secondary | ICD-10-CM

## 2021-11-27 DIAGNOSIS — Z9989 Dependence on other enabling machines and devices: Secondary | ICD-10-CM

## 2021-11-27 DIAGNOSIS — K529 Noninfective gastroenteritis and colitis, unspecified: Secondary | ICD-10-CM | POA: Diagnosis present

## 2021-11-27 DIAGNOSIS — E113299 Type 2 diabetes mellitus with mild nonproliferative diabetic retinopathy without macular edema, unspecified eye: Secondary | ICD-10-CM | POA: Diagnosis present

## 2021-11-27 DIAGNOSIS — E11319 Type 2 diabetes mellitus with unspecified diabetic retinopathy without macular edema: Secondary | ICD-10-CM | POA: Diagnosis present

## 2021-11-27 DIAGNOSIS — S82831D Other fracture of upper and lower end of right fibula, subsequent encounter for closed fracture with routine healing: Secondary | ICD-10-CM

## 2021-11-27 DIAGNOSIS — G894 Chronic pain syndrome: Secondary | ICD-10-CM | POA: Diagnosis not present

## 2021-11-27 DIAGNOSIS — E1165 Type 2 diabetes mellitus with hyperglycemia: Secondary | ICD-10-CM | POA: Diagnosis present

## 2021-11-27 DIAGNOSIS — E1142 Type 2 diabetes mellitus with diabetic polyneuropathy: Secondary | ICD-10-CM | POA: Diagnosis present

## 2021-11-27 DIAGNOSIS — W19XXXA Unspecified fall, initial encounter: Secondary | ICD-10-CM | POA: Diagnosis present

## 2021-11-27 DIAGNOSIS — I1310 Hypertensive heart and chronic kidney disease without heart failure, with stage 1 through stage 4 chronic kidney disease, or unspecified chronic kidney disease: Secondary | ICD-10-CM | POA: Diagnosis present

## 2021-11-27 DIAGNOSIS — Z9889 Other specified postprocedural states: Secondary | ICD-10-CM | POA: Diagnosis not present

## 2021-11-27 LAB — HIV ANTIBODY (ROUTINE TESTING W REFLEX): HIV Screen 4th Generation wRfx: NONREACTIVE

## 2021-11-27 LAB — COMPREHENSIVE METABOLIC PANEL
ALT: 35 U/L (ref 0–44)
AST: 33 U/L (ref 15–41)
Albumin: 2.3 g/dL — ABNORMAL LOW (ref 3.5–5.0)
Alkaline Phosphatase: 146 U/L — ABNORMAL HIGH (ref 38–126)
Anion gap: 3 — ABNORMAL LOW (ref 5–15)
BUN: 16 mg/dL (ref 8–23)
CO2: 33 mmol/L — ABNORMAL HIGH (ref 22–32)
Calcium: 10.9 mg/dL — ABNORMAL HIGH (ref 8.9–10.3)
Chloride: 96 mmol/L — ABNORMAL LOW (ref 98–111)
Creatinine, Ser: 0.85 mg/dL (ref 0.44–1.00)
GFR, Estimated: >60 mL/min (ref >60)
Glucose, Bld: 155 mg/dL — ABNORMAL HIGH (ref 70–99)
Potassium: 3.6 mmol/L (ref 3.5–5.1)
Sodium: 132 mmol/L — ABNORMAL LOW (ref 135–145)
Total Bilirubin: 0.7 mg/dL (ref 0.3–1.2)
Total Protein: 6.7 g/dL (ref 6.5–8.1)

## 2021-11-27 LAB — GLUCOSE, CAPILLARY
Glucose-Capillary: 269 mg/dL — ABNORMAL HIGH (ref 70–99)
Glucose-Capillary: 233 mg/dL — ABNORMAL HIGH (ref 70–99)
Glucose-Capillary: 217 mg/dL — ABNORMAL HIGH (ref 70–99)
Glucose-Capillary: 220 mg/dL — ABNORMAL HIGH (ref 70–99)

## 2021-11-27 LAB — CBC
HCT: 25.4 % — ABNORMAL LOW (ref 36.0–46.0)
Hemoglobin: 8 g/dL — ABNORMAL LOW (ref 12.0–15.0)
MCH: 28.3 pg (ref 26.0–34.0)
MCHC: 31.5 g/dL (ref 30.0–36.0)
MCV: 89.8 fL (ref 80.0–100.0)
Platelets: 284 10*3/uL (ref 150–400)
RBC: 2.83 MIL/uL — ABNORMAL LOW (ref 3.87–5.11)
RDW: 15.3 % (ref 11.5–15.5)
WBC: 7.3 10*3/uL (ref 4.0–10.5)
nRBC: 0 % (ref 0.0–0.2)

## 2021-11-27 LAB — C-REACTIVE PROTEIN: CRP: 23.1 mg/dL — ABNORMAL HIGH (ref <1.0)

## 2021-11-27 LAB — CBG MONITORING, ED: Glucose-Capillary: 158 mg/dL — ABNORMAL HIGH (ref 70–99)

## 2021-11-27 MED ORDER — HYDRALAZINE HCL 50 MG PO TABS
50.0000 mg | ORAL_TABLET | Freq: Three times a day (TID) | ORAL | Status: DC
  Administered 2021-11-27 – 2021-11-28 (×3): 50 mg via ORAL
  Filled 2021-11-27 (×3): qty 1

## 2021-11-27 MED ORDER — ACETAMINOPHEN 650 MG RE SUPP: 650.0000 mg | Freq: Four times a day (QID) | RECTAL | Status: DC | PRN

## 2021-11-27 MED ORDER — INSULIN PUMP
Freq: Three times a day (TID) | SUBCUTANEOUS | Status: DC
  Administered 2021-11-27 (×2): 1 via SUBCUTANEOUS
  Filled 2021-11-27: qty 1

## 2021-11-27 MED ORDER — FUROSEMIDE 40 MG PO TABS: 40.0000 mg | ORAL_TABLET | Freq: Every day | ORAL | Status: DC | PRN

## 2021-11-27 MED ORDER — OXYCODONE HCL 5 MG PO TABS: 5.0000 mg | ORAL_TABLET | ORAL | Status: DC | PRN

## 2021-11-27 MED ORDER — GABAPENTIN 600 MG PO TABS
600.0000 mg | ORAL_TABLET | Freq: Every day | ORAL | Status: DC
  Administered 2021-11-27: 600 mg via ORAL
  Filled 2021-11-27: qty 1

## 2021-11-27 MED ORDER — ALBUTEROL SULFATE (2.5 MG/3ML) 0.083% IN NEBU: 2.5000 mg | INHALATION_SOLUTION | Freq: Three times a day (TID) | RESPIRATORY_TRACT | Status: DC | PRN

## 2021-11-27 MED ORDER — ACETAMINOPHEN 500 MG PO TABS: 1000.0000 mg | ORAL_TABLET | Freq: Four times a day (QID) | ORAL | Status: DC | PRN

## 2021-11-27 MED ORDER — OXYCODONE HCL 5 MG PO TABS
10.0000 mg | ORAL_TABLET | ORAL | Status: DC | PRN
  Administered 2021-11-27 – 2021-11-28 (×2): 10 mg via ORAL
  Filled 2021-11-27 (×2): qty 2

## 2021-11-27 MED ORDER — OXYCODONE HCL 5 MG PO TABS
5.0000 mg | ORAL_TABLET | Freq: Four times a day (QID) | ORAL | Status: DC | PRN
  Administered 2021-11-27: 5 mg via ORAL
  Filled 2021-11-27: qty 1

## 2021-11-27 MED ORDER — CLONIDINE HCL 0.3 MG/24HR TD PTWK
0.3000 mg | MEDICATED_PATCH | TRANSDERMAL | Status: DC
  Administered 2021-11-27: 0.3 mg via TRANSDERMAL
  Filled 2021-11-27: qty 1

## 2021-11-27 NOTE — Progress Notes (Addendum)
PROGRESS NOTE  CIELO ARIAS ESP:[id number] DOB: Dec 24, 1951   PCP: Rusty Aus, MD  Patient is from: Home.  Lives alone.  Uses power chair at baseline.  DOA: 11/26/2021 LOS: 0  Chief complaints:  Chief Complaint  Patient presents with   Emesis   Fever     Brief Narrative / Interim history: 70 year old F with PMH of IDDM-2 with gastroparesis, neuropathy, retinopathy on insulin pump, CVA, morbid obesity, OSA on BiPAP, HTN, HLD, GIB, gastric polyp/AVM, anxiety, depression, chronic pain, hypothyroidism, osteoporosis and recent hospitalization at [facility] from 11/4-11/30 for right tibial and fibular fracture for which she had IM implant and discharged home after she refused SNF, came to ED with nausea, vomiting and diarrhea, and admitted for sepsis due to right lower extremity cellulitis.  Patient was febrile to 103.3 F.  Has markedly elevated CRP to 23.  Lactic acid and procalcitonin negative.  She has no leukocytosis.  PA chest/abdomen/pelvis negative for PE but suggestive of liver cirrhosis.  Right lower extremity x-ray suggesting cellulitis and acute nondisplaced right proximal fibula fracture, although this is not new per chart review.  Blood cultures drawn.  C. difficile and GI panel ordered.  Patient was started on vancomycin and cefepime, and admitted.  Subjective: Seen and examined earlier this morning.  No major events overnight of this morning.  She has not had further nausea, vomiting or diarrhea since yesterday morning.  She denies abdominal pain.  Denies chest pain or dyspnea.  Pain fairly controlled in her legs.  Objective: Vitals:   11/27/21 0700 11/27/21 0730 11/27/21 0940 11/27/21 1134  BP: (!) 175/73 (!) 158/54 (!) 157/54 (!) 174/65  Pulse: 70 76 69 70  Resp:   18 18  Temp:  99 F (37.2 C) 99.1 F (37.3 C) 98.7 F (37.1 C)  TempSrc:  Oral Oral   SpO2: (!) 88% 93% 98% 97%  Weight:      Height:        Intake/Output Summary (Last 24 hours) at 11/27/2021 1254 Last  data filed at 11/26/2021 1838 Gross per 24 hour  Intake 1300 ml  Output --  Net 1300 ml   Filed Weights   11/26/21 1518  Weight: (!) 145 kg    Examination:  GENERAL: No apparent distress.  Nontoxic. HEENT: MMM.  Vision and hearing grossly intact.  NECK: Supple.  Difficult to assess JVD due to body habitus. RESP:  No IWOB.  Fair aeration bilaterally but difficult exam due to body habitus.. CVS:  RRR. Heart sounds normal.  ABD/GI/GU: BS+. Abd soft, NTND.  MSK/EXT:  Moves extremities. No apparent deformity.  1-2 edema in BLE but difficult to assess SKIN: Stage III RLE wound.  Surgical wounds appears to be clean and dry.  No significant erythema or increased warmth to touch NEURO: Awake, alert and oriented appropriately.  No apparent focal neuro deficit. PSYCH: Calm. Normal affect.         Procedures:  None  Microbiology summarized: [id number] and influenza PCR nonreactive. Blood cultures NGTD. C. difficile and GIP ordered but patient has not BM.  Assessment & Plan: Sepsis due to possible right lower extremity cellulitis-POA: Had fever and increased RR on presentation.  Patient with recent right tibial and fibular fracture status post ORIF at [facility].  She has RLE wound.  No significant erythema or increased warmth to touch.  However, she was febrile to 103.3 on arrival.  Imaging suggestive of cellulitis.  No clinical or radiologic evidence of fluid collection to suggest abscess.  Hemodynamically stable.  Lactic acid and Pro-Cal within normal.  She has no leukocytosis either.  Blood cultures NGTD.  CRP elevated. -Continue IV vancomycin and cefepime for now -ID consult for guidance on antibiotics. -Consulted WOCN  Right tibial shaft and proximal fibular fracture -Status post ORIF at [place] [facility] following.  Intractable nausea/vomiting/diarrhea: Resolved.  CT A/P suggested liver cirrhosis but no acute finding.  No further N/V/B/D since arrival.  Abdominal exam  benign.  Controlled IDDM-2 with hyperglycemia, hyperlipidemia, nephropathy, retinopathy and polyneuropathy: A1c 6.4% on 10/19/2021.  On insulin pump at home.  Followed by endocrinology outpatient. Recent Labs  Lab 11/26/21 2154 11/27/21 0743 11/27/21 0944 11/27/21 1106  GLUCAP 179* 158* 269* 233*  -Appreciate diabetic coronary to help with her insulin pump. -May have to switch to SSI-if she remains hyperglycemic -Resume home gabapentin at reduced dose -Continue statin.  Hypercalcemia: Concern about primary hypothyroidism when she was at [facility].  Vitamin D was within normal.  She is also on vitamin D supplementation.  She was advised to follow-up with endocrinology.  I do not see PTH checked. -Check PTH -Resume home Lasix  -Stop vitamin D  Iron deficiency anemia: Recent EGD at [facility] showed gastric polyps and nonbleeding AVM that was cauterized.  LDH and haptoglobin were within normal.  She received iron infusions at [facility].  No report of melena or hematochezia.  Hgb was 8.1 on discharge. Recent Labs    02/02/21 1333 05/14/21 1410 07/07/21 1547 07/09/21 0429 07/17/21 1941 10/30/21 1045 11/26/21 1515 11/27/21 0557  HGB 11.7* 12.5 10.7* 10.4* 11.1* 11.3* 8.9* 8.0*  -H&H stable -Continue monitoring  Essential hypertension/resistant hypertension: SBP ranges from 150s to 170s. -Continue Avapro and instead of home Micardis. -Increase hydralazine from 50 mg twice daily to 50 mg 3 times daily. -Resume home Lasix and clonidine patch. -Continue home Coreg.  Anxiety/depression/insomnia: Stable. -Continue home Celexa, trazodone and Xanax.  Hyponatremia: Looks hypervolemic but difficult to assess fluid status due to body habitus. -Lasix as above -Continue monitoring   OSA/possible OHS on BiPAP -Nightly BiPAP. -Minimize sedating medications.   Chronic pain syndrome: Seems to be on Flexeril, tramadol, gabapentin and oxycodone -Resume gabapentin at reduced dose to avoid  withdrawal -Oxycodone as needed for moderate pain and IV fentanyl as needed for severe pain -Continue home Linzess for constipation.  Ambulatory dysfunction-patient uses power chair at home.  She was recommended SNF during her stay at [facility] but decided to go home.  -PT/OT eval-May need placement.  At risk for polypharmacy: on significant/high dose of Flexeril, Ambien, trazodone, tramadol, Xanax, oxycodone and gabapentin -Holding Ambien and Flexeril. -Reduced gabapentin and oxycodone.  Morbid obesity-difficult situation. Body mass index is 51.6 kg/m.  -Lifestyle changes with focus on dietary changes to lose weight.       DVT prophylaxis:  Place TED hose Start: 11/26/21 2136  Code Status: Full code Family Communication: Patient and/or RN. Available if any question.  Level of care: Telemetry Medical Status is: Observation  The patient will require care spanning > 2 midnights and should be moved to inpatient because: Sepsis due to cellulitis      Consultants:  Orthopedic surgery Infectious disease   Sch Meds:  Scheduled Meds:  ALPRAZolam  0.5 mg Oral TID   carvedilol  50 mg Oral BID WC   cloNIDine  0.3 mg Transdermal Weekly   enoxaparin (LOVENOX) injection  0.5 mg/kg Subcutaneous Q24H   escitalopram  10 mg Oral Daily   ferrous sulfate  325 mg Oral Q breakfast  gabapentin  600 mg Oral QHS   hydrALAZINE  50 mg Oral Q8H   insulin pump   Subcutaneous TID WC, HS, 0200   irbesartan  150 mg Oral Daily   levothyroxine  200 mcg Oral Q0600   pantoprazole  40 mg Oral BID   pramipexole  1 mg Oral QHS   pravastatin  20 mg Oral q1800   traZODone  150 mg Oral QHS   Continuous Infusions:  ceFEPime (MAXIPIME) IV Stopped (11/27/21 0516)   promethazine (PHENERGAN) injection (IM or IVPB)     vancomycin     PRN Meds:.acetaminophen **OR** acetaminophen, albuterol, budesonide, fentaNYL (SUBLIMAZE) injection, fluticasone, furosemide, labetalol, linaclotide, ondansetron **OR**  ondansetron (ZOFRAN) IV, oxyCODONE, promethazine (PHENERGAN) injection (IM or IVPB)  Antimicrobials: Anti-infectives (From admission, onward)    Start     Dose/Rate Route Frequency Ordered Stop   11/27/21 1400  vancomycin (VANCOREADY) IVPB 2000 mg/400 mL        2,000 mg 200 mL/hr over 120 Minutes Intravenous Every 24 hours 11/26/21 2152     11/27/21 0600  ceFEPIme (MAXIPIME) 2 g in sodium chloride 0.9 % 100 mL IVPB        2 g 200 mL/hr over 30 Minutes Intravenous Every 8 hours 11/26/21 2152     11/27/21 0000  vancomycin (VANCOREADY) IVPB 1500 mg/300 mL        1,500 mg 150 mL/hr over 120 Minutes Intravenous  Once 11/26/21 2152 11/27/21 0410   11/26/21 1645  ceFEPIme (MAXIPIME) 2 g in sodium chloride 0.9 % 100 mL IVPB        2 g 200 mL/hr over 30 Minutes Intravenous  Once 11/26/21 1641 11/26/21 1735   11/26/21 1645  vancomycin (VANCOCIN) IVPB 1000 mg/200 mL premix        1,000 mg 200 mL/hr over 60 Minutes Intravenous  Once 11/26/21 1641 11/26/21 1838        I have personally reviewed the following labs and images: CBC: Recent Labs  Lab 11/26/21 1515 11/27/21 0557  WBC 9.3 7.3  NEUTROABS 7.6  --   HGB 8.9* 8.0*  HCT 28.8* 25.4*  MCV 89.2 89.8  PLT 354 284   BMP &GFR Recent Labs  Lab 11/26/21 1515 11/27/21 0557  NA 130* 132*  K 3.9 3.6  CL 92* 96*  CO2 33* 33*  GLUCOSE 274* 155*  BUN 15 16  CREATININE 0.75 0.85  CALCIUM 11.4* 10.9*   Estimated Creatinine Clearance: 93.6 mL/min (by C-G formula based on SCr of 0.85 mg/dL). Liver & Pancreas: Recent Labs  Lab 11/27/21 0557  AST 33  ALT 35  ALKPHOS 146*  BILITOT 0.7  PROT 6.7  ALBUMIN 2.3*   No results for input(s): LIPASE, AMYLASE in the last 168 hours. No results for input(s): AMMONIA in the last 168 hours. Diabetic: No results for input(s): HGBA1C in the last 72 hours. Recent Labs  Lab 11/26/21 2154 11/27/21 0743 11/27/21 0944 11/27/21 1106  GLUCAP 179* 158* 269* 233*   Cardiac Enzymes: No  results for input(s): CKTOTAL, CKMB, CKMBINDEX, TROPONINI in the last 168 hours. No results for input(s): PROBNP in the last 8760 hours. Coagulation Profile: No results for input(s): INR, PROTIME in the last 168 hours. Thyroid Function Tests: No results for input(s): TSH, T4TOTAL, FREET4, T3FREE, THYROIDAB in the last 72 hours. Lipid Profile: No results for input(s): CHOL, HDL, LDLCALC, TRIG, CHOLHDL, LDLDIRECT in the last 72 hours. Anemia Panel: No results for input(s): VITAMINB12, FOLATE, FERRITIN, TIBC, IRON, RETICCTPCT in the  last 72 hours. Urine analysis:    Component Value Date/Time   COLORURINE YELLOW (A) 11/26/2021 2020   APPEARANCEUR HAZY (A) 11/26/2021 2020   APPEARANCEUR Clear 10/22/2014 1539   LABSPEC 1.036 (H) 11/26/2021 2020   LABSPEC 1.010 10/22/2014 1539   PHURINE 6.0 11/26/2021 2020   GLUCOSEU 50 (A) 11/26/2021 2020   GLUCOSEU 150 mg/dL 10/22/2014 1539   HGBUR NEGATIVE 11/26/2021 2020   BILIRUBINUR NEGATIVE 11/26/2021 2020   BILIRUBINUR Negative 10/22/2014 [place] 11/26/2021 2020   PROTEINUR 30 (A) 11/26/2021 2020   UROBILINOGEN 0.2 07/03/2009 1105   NITRITE NEGATIVE 11/26/2021 2020   LEUKOCYTESUR NEGATIVE 11/26/2021 2020   LEUKOCYTESUR Negative 10/22/2014 1539   Sepsis Labs: Invalid input(s): PROCALCITONIN, [place]  Microbiology: Recent Results (from the past 240 hour(s))  Blood culture (routine x 2)     Status: None (Preliminary result)   Collection Time: 11/26/21  4:03 PM   Specimen: BLOOD  Result Value Ref Range Status   Specimen Description BLOOD BLOOD LEFT FOREARM  Final   Special Requests   Final    BOTTLES DRAWN AEROBIC AND ANAEROBIC Blood Culture results may not be optimal due to an excessive volume of blood received in culture bottles   Culture   Final    NO GROWTH < 24 HOURS Performed at [facility], [address] [place], [place] [address]    Report Status PENDING  Incomplete  Blood culture (routine x 2)      Status: None (Preliminary result)   Collection Time: 11/26/21  4:03 PM   Specimen: BLOOD  Result Value Ref Range Status   Specimen Description BLOOD BLOOD RIGHT ARM  Final   Special Requests   Final    BOTTLES DRAWN AEROBIC AND ANAEROBIC Blood Culture adequate volume   Culture   Final    NO GROWTH < 24 HOURS Performed at [facility], [address] [place], [place] [address]    Report Status PENDING  Incomplete  Resp Panel by RT-PCR (Flu A&B, Covid) Nasopharyngeal Swab     Status: None   Collection Time: 11/26/21  5:03 PM   Specimen: Nasopharyngeal Swab; Nasopharyngeal(NP) swabs in vial transport medium  Result Value Ref Range Status   SARS Coronavirus 2 by RT PCR NEGATIVE NEGATIVE Final    Comment: (NOTE) SARS-CoV-2 target nucleic acids are NOT DETECTED.  The SARS-CoV-2 RNA is generally detectable in upper respiratory specimens during the acute phase of infection. The lowest concentration of SARS-CoV-2 viral copies this assay can detect is 138 copies/mL. A negative result does not preclude SARS-Cov-2 infection and should not be used as the sole basis for treatment or other patient management decisions. A negative result may occur with  improper specimen collection/handling, submission of specimen other than nasopharyngeal swab, presence of viral mutation(s) within the areas targeted by this assay, and inadequate number of viral copies(<138 copies/mL). A negative result must be combined with clinical observations, patient history, and epidemiological information. The expected result is Negative.  Fact Sheet for Patients:  EntrepreneurPulse.com.au  Fact Sheet for Healthcare Providers:  IncredibleEmployment.be  This test is no t yet approved or cleared by the [place] FDA and  has been authorized for detection and/or diagnosis of SARS-CoV-2 by FDA under an Emergency Use Authorization (EUA). This EUA will remain  in effect  (meaning this test can be used) for the duration of the COVID-19 declaration under Section 564(b)(1) of the Act, 21 U.S.C.section 360bbb-3(b)(1), unless the authorization is terminated  or  revoked sooner.       Influenza A by PCR NEGATIVE NEGATIVE Final   Influenza B by PCR NEGATIVE NEGATIVE Final    Comment: (NOTE) The Xpert Xpress SARS-CoV-2/FLU/RSV plus assay is intended as an aid in the diagnosis of influenza from Nasopharyngeal swab specimens and should not be used as a sole basis for treatment. Nasal washings and aspirates are unacceptable for Xpert Xpress SARS-CoV-2/FLU/RSV testing.  Fact Sheet for Patients: EntrepreneurPulse.com.au  Fact Sheet for Healthcare Providers: IncredibleEmployment.be  This test is not yet approved or cleared by the [place] FDA and has been authorized for detection and/or diagnosis of SARS-CoV-2 by FDA under an Emergency Use Authorization (EUA). This EUA will remain in effect (meaning this test can be used) for the duration of the COVID-19 declaration under Section 564(b)(1) of the Act, 21 U.S.C. section 360bbb-3(b)(1), unless the authorization is terminated or revoked.  Performed at [facility], [address] [place],  [address]     Radiology Studies: CT Angio Chest PE W and/or Wo Contrast  Result Date: 11/26/2021 CLINICAL DATA:  Patient presents via EMS from home when daughter called EMS due to pt vomiting excessively. Pt denies abdominal pain but c/o right leg pain 8/10. Vomiting began last night and pt is febrile today. Pt has had some diarrhea past few nights. Pt c/o weakness for a few days. Pt was discharged last night from [place] after having surgery for a broken right leg. Pt unaware of being febrile last night at discharge.Pt originally admitted 10/30/2021 when fell. Pt's temp was 102.5 oral via EMS and pt had low SPO2 in 80's prior to oxygen via nasal cannula. Pt baseline  sat on presentation on room air 89% and improved to 97% on 2L/min O2 via nasal cannula. Pt normally only wears O2 at night (3L/min). Pt denies SOB. EXAM: CT ANGIOGRAPHY CHEST CT ABDOMEN AND PELVIS WITH CONTRAST TECHNIQUE: Multidetector CT imaging of the chest was performed using the standard protocol during bolus administration of intravenous contrast. Multiplanar CT image reconstructions and MIPs were obtained to evaluate the vascular anatomy. Multidetector CT imaging of the abdomen and pelvis was performed using the standard protocol during bolus administration of intravenous contrast. CONTRAST:  114mL OMNIPAQUE IOHEXOL 350 MG/ML SOLN COMPARISON:  Abdomen and pelvis CT, 04/13/1999 FINDINGS: CTA CHEST FINDINGS Cardiovascular: Opacification of the central pulmonary arteries is satisfactory. There is heterogeneous opacification of the segmental and smaller vessels, also somewhat limited by respiratory motion. Allowing for this limitation, there is no evidence of a pulmonary embolism. Heart is mildly enlarged. No pericardial effusion. Three-vessel coronary artery calcifications. Great vessels are normal in caliber. No aortic dissection. Mild aortic atherosclerosis. Mediastinum/Nodes: No neck base, mediastinal or hilar masses or enlarged lymph nodes. Trachea and esophagus are unremarkable. Lungs/Pleura: Minor subsegmental atelectasis in the dependent lower lobes. No evidence of pneumonia or pulmonary edema. No mass or suspicious nodule. No pleural effusion or pneumothorax. Musculoskeletal: No fracture. No bone lesion. Subcutaneous mass, anterior mid chest consistent with a sebaceous cyst. Review of the MIP images confirms the above findings. CT ABDOMEN and PELVIS FINDINGS Hepatobiliary: Liver normal in size. There is surface nodularity consistent with cirrhosis. No liver mass. Status post cholecystectomy. No bile duct dilation. Pancreas: Unremarkable. No pancreatic ductal dilatation or surrounding inflammatory changes.  Spleen: Normal in size without focal abnormality. Adrenals/Urinary Tract: No adrenal masses. Kidneys normal size, orientation and position. Small nonobstructing stones in the left kidney, mid to upper pole. No right intrarenal stones. No renal masses. No  hydronephrosis. Normal ureters. Normal bladder. Stomach/Bowel: Normal stomach. Small bowel and colon are normal in caliber. No wall thickening. No inflammation. No evidence of appendicitis. Vascular/Lymphatic: Aortic atherosclerosis. No aneurysm. Prominent gastrohepatic ligament lymph nodes, largest 1 cm short axis, stable. No other adenopathy. Reproductive: Findings consistent with a hysterectomy. No pelvic masses. Other: No ascites. Musculoskeletal: Previous lumbar spine fusion, L3 through L5. Spine stimulator leads in the posterior lower thoracic spinal canal. No acute fracture. No bone lesion. Review of the MIP images confirms the above findings. IMPRESSION: CTA CHEST 1. Mildly limited study as detailed above. No evidence of a pulmonary embolism. 2. No acute findings in the chest. 3. Coronary artery calcifications and aortic atherosclerosis. CT ABDOMEN AND PELVIS 1. No acute findings within the abdomen or pelvis. 2. Liver morphologic changes consistent with cirrhosis, stable from prior CT. No liver masses. 3. Aortic atherosclerosis. Electronically Signed   By: Lajean Manes M.D.   On: 11/26/2021 19:16   CT ABDOMEN PELVIS W CONTRAST  Result Date: 11/26/2021 CLINICAL DATA:  Patient presents via EMS from home when daughter called EMS due to pt vomiting excessively. Pt denies abdominal pain but c/o right leg pain 8/10. Vomiting began last night and pt is febrile today. Pt has had some diarrhea past few nights. Pt c/o weakness for a few days. Pt was discharged last night from [place] after having surgery for a broken right leg. Pt unaware of being febrile last night at discharge.Pt originally admitted 10/30/2021 when fell. Pt's temp was 102.5 oral via EMS and  pt had low SPO2 in 80's prior to oxygen via nasal cannula. Pt baseline sat on presentation on room air 89% and improved to 97% on 2L/min O2 via nasal cannula. Pt normally only wears O2 at night (3L/min). Pt denies SOB. EXAM: CT ANGIOGRAPHY CHEST CT ABDOMEN AND PELVIS WITH CONTRAST TECHNIQUE: Multidetector CT imaging of the chest was performed using the standard protocol during bolus administration of intravenous contrast. Multiplanar CT image reconstructions and MIPs were obtained to evaluate the vascular anatomy. Multidetector CT imaging of the abdomen and pelvis was performed using the standard protocol during bolus administration of intravenous contrast. CONTRAST:  125mL OMNIPAQUE IOHEXOL 350 MG/ML SOLN COMPARISON:  Abdomen and pelvis CT, 04/13/1999 FINDINGS: CTA CHEST FINDINGS Cardiovascular: Opacification of the central pulmonary arteries is satisfactory. There is heterogeneous opacification of the segmental and smaller vessels, also somewhat limited by respiratory motion. Allowing for this limitation, there is no evidence of a pulmonary embolism. Heart is mildly enlarged. No pericardial effusion. Three-vessel coronary artery calcifications. Great vessels are normal in caliber. No aortic dissection. Mild aortic atherosclerosis. Mediastinum/Nodes: No neck base, mediastinal or hilar masses or enlarged lymph nodes. Trachea and esophagus are unremarkable. Lungs/Pleura: Minor subsegmental atelectasis in the dependent lower lobes. No evidence of pneumonia or pulmonary edema. No mass or suspicious nodule. No pleural effusion or pneumothorax. Musculoskeletal: No fracture. No bone lesion. Subcutaneous mass, anterior mid chest consistent with a sebaceous cyst. Review of the MIP images confirms the above findings. CT ABDOMEN and PELVIS FINDINGS Hepatobiliary: Liver normal in size. There is surface nodularity consistent with cirrhosis. No liver mass. Status post cholecystectomy. No bile duct dilation. Pancreas:  Unremarkable. No pancreatic ductal dilatation or surrounding inflammatory changes. Spleen: Normal in size without focal abnormality. Adrenals/Urinary Tract: No adrenal masses. Kidneys normal size, orientation and position. Small nonobstructing stones in the left kidney, mid to upper pole. No right intrarenal stones. No renal masses. No hydronephrosis. Normal ureters. Normal bladder. Stomach/Bowel: Normal stomach. Small  bowel and colon are normal in caliber. No wall thickening. No inflammation. No evidence of appendicitis. Vascular/Lymphatic: Aortic atherosclerosis. No aneurysm. Prominent gastrohepatic ligament lymph nodes, largest 1 cm short axis, stable. No other adenopathy. Reproductive: Findings consistent with a hysterectomy. No pelvic masses. Other: No ascites. Musculoskeletal: Previous lumbar spine fusion, L3 through L5. Spine stimulator leads in the posterior lower thoracic spinal canal. No acute fracture. No bone lesion. Review of the MIP images confirms the above findings. IMPRESSION: CTA CHEST 1. Mildly limited study as detailed above. No evidence of a pulmonary embolism. 2. No acute findings in the chest. 3. Coronary artery calcifications and aortic atherosclerosis. CT ABDOMEN AND PELVIS 1. No acute findings within the abdomen or pelvis. 2. Liver morphologic changes consistent with cirrhosis, stable from prior CT. No liver masses. 3. Aortic atherosclerosis. Electronically Signed   By: Lajean Manes M.D.   On: 11/26/2021 19:16   CT TIBIA FIBULA RIGHT W CONTRAST  Result Date: 11/26/2021 CLINICAL DATA:  Recent surgical intervention for a broken right leg, presenting with fever and right lower extremity pain and swelling. EXAM: CT OF THE LOWER RIGHT EXTREMITY WITH CONTRAST TECHNIQUE: Multidetector CT imaging of the lower right extremity was performed according to the standard protocol following intravenous contrast administration. CONTRAST:  166mL OMNIPAQUE IOHEXOL 350 MG/ML SOLN COMPARISON:  None.  FINDINGS: Bones/Joint/Cartilage A metallic density intramedullary rod is seen extending through the length of the right tibia. Proximal and distal metallic density fixation screws are also noted. Surgically repaired, acute, nondisplaced fractures of the proximal and distal portions of the right tibial shaft are also seen. An acute, nondisplaced fracture of the neck of the proximal right fibula is seen. There is a chronic fracture deformity of the shaft of the distal right fibula, just above the level of the right lateral malleolus. There is no evidence of dislocation. Marked severity degenerative changes are seen within the right knee. Ligaments Suboptimally assessed by CT. Muscles and Tendons Unremarkable. Soft tissues A small joint effusion is seen within the visualized portion of the right knee. Areas of moderate to marked severity subcutaneous and para muscular inflammatory fat stranding are seen along the posterior, lateral and anteromedial aspects of the right leg below the level of the knee. There is no evidence of associated fluid collection or abscess. A 2.1 cm x 0.8 cm superficial soft tissue defect is seen adjacent to the medial aspect of the distal right tibial shaft (axial CT images 231 through 239, CT series 9). Adjacent subcutaneous inflammatory fat stranding and edema is noted which extends to the cortex of the tibia. No associated cortical destruction is seen. IMPRESSION: 1. Moderate to marked severity diffuse cellulitis, most prominent along the posterior, lateral and anteromedial aspects of the soft tissues below the right knee. 2. Focal soft tissue defect, and associated inflammatory changes, along the medial aspect of the distal right lower extremity without evidence of acute osteomyelitis. 3. Status post ORIF of the right tibia. 4. Acute nondisplaced fracture of the proximal right fibula. 5. Small right knee effusion. 6. Marked severity degenerative changes within the right knee. Electronically  Signed   By: Virgina Norfolk M.D.   On: 11/26/2021 19:36   DG Chest Portable 1 View  Result Date: 11/26/2021 CLINICAL DATA:  Hypoxia fever EXAM: PORTABLE CHEST 1 VIEW COMPARISON:  07/10/2021 FINDINGS: Cardiomegaly with vascular congestion. No focal consolidation, pleural effusion, or pneumothorax. Aortic atherosclerosis. IMPRESSION: Cardiomegaly with vascular congestion Electronically Signed   By: Donavan Foil M.D.   On: 11/26/2021  15:56     60 minutes with more than 50% spent in reviewing records, counseling patient/family and coordinating care.   Vincenta Steffey T. [place]  If 7PM-7AM, please contact night-coverage [email or web address] 11/27/2021, 12:54 PM

## 2021-11-27 NOTE — Progress Notes (Signed)
Pt refused to do dressing change. Pt said wound nurse should be doing the dressing change like what [facility] is doing. Encouraged the pt that RN can do it but refused. Wound nurse was notified and will let the wound nurse talk to pt.

## 2021-11-27 NOTE — Progress Notes (Signed)
Patient ID: Brittney [name], female   DOB: 06-Oct-1951, 70 y.o.   MRN: [id number] Patient rechecked this morning and feels like her upper posterior calf pain is better, it feels softer to palpation.  Recommend continued IV antibiotics and close follow-up

## 2021-11-27 NOTE — Consult Note (Signed)
[facility] Nurse Consult Note: Reason for Consult: Consult requested for bilat legs.  Performed remotely after review of progress notes and photos in the EMR.  Pt was recently discharged from [facility] for a right leg fracture, according to progress notes.  Wound type: Left leg with dry red healing full thickness wound to lower calf.  Patchy areas of dry dark scabs scattered across anterior calf. Right anterior calf with patchy areas of intact sutures and scattered dry scabbed areas. Right upper calf with dry red healing full thickness wound.  Right lower calf with red moist full thickness wound with mod amt tan drainage. Dressing procedure/placement/frequency: Topical treatment orders provided for bedside nurses to perform as follows to promote healing: . Apply xeroform gauze to red or dry scabbed wounds on bilat legs Q day, then cover with ABD pads and kerlex  2. Apply Aquacel Kellie Simmering # [phone]) to right lower leg wound Q day, then cover with ABD pad and kerlex. Please re-consult if further assistance is needed.  Thank-you,  Julien Girt MSN, [place], [place], [place], [place]

## 2021-11-27 NOTE — Consult Note (Signed)
NAME: Brittney [name]  DOB: 1951/02/26  MRN: [id number]  Date/Time: 11/27/2021 1:21 PM  REQUESTING PROVIDER: Georges Lynch Subjective:  REASON FOR CONSULT: rt leg cellulitis ? Brittney [name] is a 70 y.o.  female with a history of DM, CAD, CKD, Hepatic cirrhosis, HLD, HTN, left TKA, lumbar fusion surgery, spinal cord stimulator with battery pack on the left buttock area presents to [facility] ED on 11/26/21 with vomiting and fever of 1 day duration- she had been discharged from [facility] the previous day only.  Patient complains of worsening right leg pain for the past week. Recent hospitalization at [facility] between 10/30/21- 11/25/21 for rt type 2 open tibial shaft fracture in the setting of chronic rt lower extremity wounds s/p I/D of tibial nail and wound vac application on 62/7/03. On 11/03/21 she had a fever and work up including blood culture, resp viral panel, urine were neg- fever resolved . Pt refused SNF and was sent home on 11/30. As the hoist lift was not sent home the daughter could not help her and she brought her to the ED. In the ED had a temp 103.3, BP 117/76, pulse 96 and respiratory rate of 24 with sats of 90%.  WBC was 9.3, Hb 8.9, platelet 354 and creatinine 0.75.  Patient sustained multiple injuries to her leg by either falling or hitting on the cabinets.  She is wheelchair-bound.  With very little mobility.  She completed from bed to chair.  Had an injury  the rt leg in May 2022 hitting her leg on cabinets and had a wound.  Culture from the right leg wound in the past, July had grown MRSA.  She has been treated with doxycycline and other antibiotics in the past.  She has been followed at the wound clinic. Fracture left ankle with hematoma in July 2022  Past Medical History:  Diagnosis Date   Abdominal pain, left upper quadrant 12/11/2012   Anemia    Arthritis    Asthma    Broken leg 2014   fell twice and broke same leg. had a bone stimulator for first break.   Cancer ([place]) 2007   melanoma,  left ear and back of left leg, removed from back   Chronic kidney disease, stage 2 (mild)    Chronic lower back pain    Collagen vascular disease (HCC)    Complication of anesthesia    slow to wake, coughing   Coronary artery disease    Diabetes mellitus without complication ([place])    Diabetic nephropathy associated with secondary diabetes mellitus ([place])    Flushing 12/11/2012   Gastroparesis    Gross hematuria 12/11/2012   Hepatic cirrhosis ([place])    History of kidney stones    Hyperlipidemia    Hypertension    Hypothyroidism    Hypothyroidism    IBS (irritable bowel syndrome)    Kidney stone 12/11/2012   Lower extremity edema    Morbid obesity with BMI of 40.0-44.9, adult (HCC)    Motion sickness    back seat of cars   MRSA (methicillin resistant staph aureus) culture positive 2016   Myocardial infarction ([place]) 09/2013   Nausea without vomiting 12/11/2012   Nephrolithiasis    Nephrolithiasis 04/26/2014   Nonproliferative retinopathy due to secondary diabetes (HCC)    Numbness and tingling of right leg    Peripheral vascular disease ([place])    Renal colic 50/08/3817   Renal insufficiency    Sciatica    Sciatica 12/11/2012   Skin cancer 08/2018  Sleep apnea    USES CPAP   Spinal cord stimulator status    battery in left buttock   Stroke ([place]) 07/2020   Unilateral small kidney without contralateral hypertrophy    Wears dentures    full lower   Wheelchair dependent    reports able to transfer with assist    Past Surgical History:  Procedure Laterality Date   ABDOMINAL HYSTERECTOMY     BACK SURGERY  04/1999   L-4-5 LAMINECTOMY. metal in lower back and neck   CATARACT EXTRACTION W/PHACO Right 09/17/2021   Procedure: CATARACT EXTRACTION PHACO AND INTRAOCULAR LENS PLACEMENT ([place]) RIGHT DIABETIC;  Surgeon: Leandrew Koyanagi, MD;  Location: [facility] ORS;  Service: Ophthalmology;  Laterality: Right;  5.64 0:58.9   [place]     COLONOSCOPY   05/04/2001   COLONOSCOPY WITH PROPOFOL N/A 09/20/2016   Procedure: COLONOSCOPY WITH PROPOFOL;  Surgeon: Lollie Sails, MD;  Location: [facility] ENDOSCOPY;  Service: Endoscopy;  Laterality: N/A;   DILATION AND CURETTAGE OF UTERUS     ESOPHAGOGASTRODUODENOSCOPY  02/08/2014   ESOPHAGOGASTRODUODENOSCOPY (EGD) WITH PROPOFOL N/A 09/20/2016   Procedure: ESOPHAGOGASTRODUODENOSCOPY (EGD) WITH PROPOFOL;  Surgeon: Lollie Sails, MD;  Location: [facility] ENDOSCOPY;  Service: Endoscopy;  Laterality: N/A;   ESOPHAGOGASTRODUODENOSCOPY (EGD) WITH PROPOFOL N/A 05/23/2018   Procedure: ESOPHAGOGASTRODUODENOSCOPY (EGD) WITH PROPOFOL;  Surgeon: Lollie Sails, MD;  Location: [facility] ENDOSCOPY;  Service: Endoscopy;  Laterality: N/A;   ESOPHAGOGASTRODUODENOSCOPY (EGD) WITH PROPOFOL N/A 07/18/2020   Procedure: ESOPHAGOGASTRODUODENOSCOPY (EGD) WITH PROPOFOL;  Surgeon: Lesly Rubenstein, MD;  Location: [facility] ENDOSCOPY;  Service: Gastroenterology;  Laterality: N/A;   EYE SURGERY  1986   FOR MELANOMA   HERNIA REPAIR     JOINT REPLACEMENT Left 09/24/2013   TOTAL KNEE   MOHS SURGERY  11/2016   left side of nose   PULSE GENERATOR IMPLANT N/A 11/02/2017   Procedure: UNILATERAL PULSE GENERATOR IMPLANT;  Surgeon: Meade Maw, MD;  Location: [facility] ORS;  Service: Neurosurgery;  Laterality: N/A;   TONSILLECTOMY     WITH ADENOIDECTOMY    Social History   Socioeconomic History   Marital status: Single    Spouse name: Not on file   Number of children: Not on file   Years of education: Not on file   Highest education level: Not on file  Occupational History   Not on file  Tobacco Use   Smoking status: Never   Smokeless tobacco: Never  Vaping Use   Vaping Use: Never used  Substance and Sexual Activity   Alcohol use: No   Drug use: Never   Sexual activity: Not Currently  Other Topics Concern   Not on file  Social History Narrative   Not on file   Social Determinants of Health   Financial Resource Strain: Not  on file  Food Insecurity: Not on file  Transportation Needs: Not on file  Physical Activity: Not on file  Stress: Not on file  Social Connections: Not on file  Intimate Partner Violence: Not on file    Family History  Problem Relation Age of Onset   Heart disease Mother    Cancer Mother    Asthma Mother    Diabetes Father    Kidney disease Father    Hypertension Father    Breast cancer Neg Hx    Allergies  Allergen Reactions   Shellfish Allergy Nausea And Vomiting, Diarrhea and Nausea Only    Non stop sickness once ingests shellfish. Betadine is okay  Cephalexin Other (See Comments)    Doesn't digest it well   Amlodipine Cough   Codeine Nausea And Vomiting and Nausea Only    states she can take codeine cough syrup without problems states she can take codeine cough syrup without problems   Imdur [Isosorbide Dinitrate] Other (See Comments) and Cough    Headache   Lyrica [Pregabalin] Swelling and Other (See Comments)    "FLU-LIKE" SYMPTOMS   Monosodium Glutamate Other (See Comments) and Cough    Headache   Tape Other (See Comments) and Nausea And Vomiting    can use paper tape Redness   I? Current Facility-Administered Medications  Medication Dose Route Frequency Provider Last Rate Last Admin   acetaminophen (TYLENOL) tablet 1,000 mg  1,000 mg Oral Q6H PRN Cox, Amy N, DO       Or   acetaminophen (TYLENOL) suppository 650 mg  650 mg Rectal Q6H PRN Cox, Amy N, DO       albuterol (PROVENTIL) (2.5 MG/3ML) 0.083% nebulizer solution 2.5 mg  2.5 mg Nebulization TID PRN Renda Rolls, RPH       ALPRAZolam Duanne Moron) tablet 0.5 mg  0.5 mg Oral TID Cox, Amy N, DO   0.5 mg at 11/27/21 0907   budesonide (PULMICORT) nebulizer solution 0.5 mg  2 mL Nebulization Daily PRN Cox, Amy N, DO       carvedilol (COREG) tablet 50 mg  50 mg Oral BID WC Cox, Amy N, DO   50 mg at 11/27/21 3474   ceFEPIme (MAXIPIME) 2 g in sodium chloride 0.9 % 100 mL IVPB  2 g Intravenous Q8H Cox, Amy N, DO    Stopped at 11/27/21 0516   cloNIDine (CATAPRES - Dosed in mg/24 hr) patch 0.3 mg  0.3 mg Transdermal Weekly Gonfa, Taye T, MD       enoxaparin (LOVENOX) injection 72.5 mg  0.5 mg/kg Subcutaneous Q24H Cox, Amy N, DO   72.5 mg at 11/27/21 1315   escitalopram (LEXAPRO) tablet 10 mg  10 mg Oral Daily Cox, Amy N, DO   10 mg at 11/27/21 2595   fentaNYL (SUBLIMAZE) injection 50 mcg  50 mcg Intravenous Q3H PRN Cox, Amy N, DO   50 mcg at 11/26/21 2232   ferrous sulfate tablet 325 mg  325 mg Oral Q breakfast Cox, Amy N, DO       fluticasone (FLONASE) 50 MCG/ACT nasal spray 2 spray  2 spray Each Nare Daily PRN Cox, Amy N, DO       furosemide (LASIX) tablet 40 mg  40 mg Oral Daily PRN Gonfa, Taye T, MD       gabapentin (NEURONTIN) tablet 600 mg  600 mg Oral QHS Gonfa, Taye T, MD       hydrALAZINE (APRESOLINE) tablet 50 mg  50 mg Oral Q8H Gonfa, Taye T, MD   50 mg at 11/27/21 1315   insulin pump   Subcutaneous TID WC, HS, 0200 Cox, Amy N, DO   Given at 11/27/21 0117   irbesartan (AVAPRO) tablet 150 mg  150 mg Oral Daily Cox, Amy N, DO   150 mg at 11/27/21 6387   labetalol (NORMODYNE) injection 5 mg  5 mg Intravenous Q3H PRN Cox, Amy N, DO       levothyroxine (SYNTHROID) tablet 200 mcg  200 mcg Oral Q0600 Cox, Amy N, DO   200 mcg at 11/27/21 0516   linaclotide (LINZESS) capsule 145 mcg  145 mcg Oral Daily PRN Cox, Amy N, DO  ondansetron (ZOFRAN) tablet 4 mg  4 mg Oral Q6H PRN Cox, Amy N, DO       Or   ondansetron (ZOFRAN) injection 4 mg  4 mg Intravenous Q6H PRN Cox, Amy N, DO   4 mg at 11/26/21 2232   oxyCODONE (Oxy IR/ROXICODONE) immediate release tablet 5 mg  5 mg Oral Q6H PRN Mercy Riding, MD   5 mg at 11/27/21 1315   pantoprazole (PROTONIX) EC tablet 40 mg  40 mg Oral BID Cox, Amy N, DO   40 mg at 11/27/21 5397   pramipexole (MIRAPEX) tablet 1 mg  1 mg Oral QHS Cox, Amy N, DO       pravastatin (PRAVACHOL) tablet 20 mg  20 mg Oral q1800 Cox, Amy N, DO       promethazine (PHENERGAN) 12.5 mg in  sodium chloride 0.9 % 50 mL IVPB  12.5 mg Intravenous Q6H PRN Cox, Amy N, DO       traZODone (DESYREL) tablet 150 mg  150 mg Oral QHS Cox, Amy N, DO   150 mg at 11/27/21 0003   vancomycin (VANCOREADY) IVPB 2000 mg/400 mL  2,000 mg Intravenous Q24H Cox, Amy N, DO         Abtx:  Anti-infectives (From admission, onward)    Start     Dose/Rate Route Frequency Ordered Stop   11/27/21 1400  vancomycin (VANCOREADY) IVPB 2000 mg/400 mL        2,000 mg 200 mL/hr over 120 Minutes Intravenous Every 24 hours 11/26/21 2152     11/27/21 0600  ceFEPIme (MAXIPIME) 2 g in sodium chloride 0.9 % 100 mL IVPB        2 g 200 mL/hr over 30 Minutes Intravenous Every 8 hours 11/26/21 2152     11/27/21 0000  vancomycin (VANCOREADY) IVPB 1500 mg/300 mL        1,500 mg 150 mL/hr over 120 Minutes Intravenous  Once 11/26/21 2152 11/27/21 0410   11/26/21 1645  ceFEPIme (MAXIPIME) 2 g in sodium chloride 0.9 % 100 mL IVPB        2 g 200 mL/hr over 30 Minutes Intravenous  Once 11/26/21 1641 11/26/21 1735   11/26/21 1645  vancomycin (VANCOCIN) IVPB 1000 mg/200 mL premix        1,000 mg 200 mL/hr over 60 Minutes Intravenous  Once 11/26/21 1641 11/26/21 1838       REVIEW OF SYSTEMS:  Const:.   fever at home.  Negative chills, negative weight loss Eyes: negative diplopia or visual changes, negative eye pain ENT: negative coryza, negative sore throat Resp: negative cough, hemoptysis, dyspnea Cards: negative for chest pain, palpitations, lower extremity edema GU: negative for frequency, dysuria and hematuria GI: Negative for abdominal pain,constipation.  She denies diarrhea.  She had vomiting.  History of GI bleed when she was at [facility]. Skin: Multiple wounds in her legs. Heme: Bruising over her abdominal wall from heparin shots. MS: Generalized weakness.  Worsening right leg pain Wheelchair-bound Neurolo:negative for headaches, dizziness, vertigo, memory problems  Psych: negative for feelings of anxiety, depression   Endocrine: Has hypothyroidism and diabetes mellitus  allergy/Immunology-as above Objective:  VITALS:  BP (!) 174/65 (BP Location: Left Arm)   Pulse 70   Temp 98.7 F (37.1 C)   Resp 18   Ht 5\' 6"  (1.676 m)   Wt (!) 145 kg   SpO2 97%   BMI 51.60 kg/m  PHYSICAL EXAM:  General: Alert, cooperative, no distress, appears stated age.  Increased BMI. Head: Normocephalic, without obvious abnormality, atraumatic. Eyes: Conjunctivae clear, anicteric sclerae. Pupils are equal ENT Nares normal. No drainage or sinus tenderness. Lips, mucosa, and tongue normal. No Thrush Neck: Supple, symmetrical, no adenopathy, thyroid: non tender no carotid bruit and no JVD. Back: No CVA tenderness. Lungs: Bilateral air entry Heart: Regular rate and rhythm, no murmur, rub or gallop. Abdomen: Soft, insulin pump non-tender,not distended. Bowel sounds normal. No masses Abdominal wall bruising Extremities: Right leg surgical site with sutures Some erythema around it There is a circular wound adjacent to it There are some swelling and erythema. Left leg has a superficial shin wound.  Almost healed Rt leg     Left leg   Skin: No rashes or lesions. Or bruising Lymph: Cervical, supraclavicular normal. Neurologic: Grossly non-focal Pertinent Labs Lab Results CBC    Component Value Date/Time   WBC 7.3 11/27/2021 0557   RBC 2.83 (L) 11/27/2021 0557   HGB 8.0 (L) 11/27/2021 0557   HGB 12.6 12/10/2014 1133   HCT 25.4 (L) 11/27/2021 0557   HCT 39.5 12/10/2014 1133   PLT 284 11/27/2021 0557   PLT 197 12/10/2014 1133   MCV 89.8 11/27/2021 0557   MCV 88 12/10/2014 1133   MCH 28.3 11/27/2021 0557   MCHC 31.5 11/27/2021 0557   RDW 15.3 11/27/2021 0557   RDW 13.9 12/10/2014 1133   LYMPHSABS 0.8 11/26/2021 1515   LYMPHSABS 2.4 12/10/2014 1133   MONOABS 0.8 11/26/2021 1515   MONOABS 0.4 12/10/2014 1133   EOSABS 0.0 11/26/2021 1515   EOSABS 0.4 12/10/2014 1133   BASOSABS 0.0 11/26/2021 1515    BASOSABS 0.1 12/10/2014 1133    CMP Latest Ref Rng & Units 11/27/2021 11/26/2021 10/30/2021  Glucose 70 - 99 mg/dL 155(H) 274(H) 214(H)  BUN 8 - 23 mg/dL 16 15 37(H)  Creatinine 0.44 - 1.00 mg/dL 0.85 0.75 1.29(H)  Sodium 135 - 145 mmol/L 132(L) 130(L) 136  Potassium 3.5 - 5.1 mmol/L 3.6 3.9 4.6  Chloride 98 - 111 mmol/L 96(L) 92(L) 90(L)  CO2 22 - 32 mmol/L 33(H) 33(H) 35(H)  Calcium 8.9 - 10.3 mg/dL 10.9(H) 11.4(H) 10.2  Total Protein 6.5 - 8.1 g/dL 6.7 - -  Total Bilirubin 0.3 - 1.2 mg/dL 0.7 - -  Alkaline Phos 38 - 126 U/L 146(H) - -  AST 15 - 41 U/L 33 - -  ALT 0 - 44 U/L 35 - -      Microbiology: Recent Results (from the past 240 hour(s))  Blood culture (routine x 2)     Status: None (Preliminary result)   Collection Time: 11/26/21  4:03 PM   Specimen: BLOOD  Result Value Ref Range Status   Specimen Description BLOOD BLOOD LEFT FOREARM  Final   Special Requests   Final    BOTTLES DRAWN AEROBIC AND ANAEROBIC Blood Culture results may not be optimal due to an excessive volume of blood received in culture bottles   Culture   Final    NO GROWTH < 24 HOURS Performed at [facility], [address] [place], [place] [address]    Report Status PENDING  Incomplete  Blood culture (routine x 2)     Status: None (Preliminary result)   Collection Time: 11/26/21  4:03 PM   Specimen: BLOOD  Result Value Ref Range Status   Specimen Description BLOOD BLOOD RIGHT ARM  Final   Special Requests   Final    BOTTLES DRAWN AEROBIC AND ANAEROBIC Blood Culture adequate volume  Culture   Final    NO GROWTH < 24 HOURS Performed at [facility], [address] [place], [place] [address]    Report Status PENDING  Incomplete  Resp Panel by RT-PCR (Flu A&B, Covid) Nasopharyngeal Swab     Status: None   Collection Time: 11/26/21  5:03 PM   Specimen: Nasopharyngeal Swab; Nasopharyngeal(NP) swabs in vial transport medium  Result Value Ref Range Status   SARS Coronavirus 2 by  RT PCR NEGATIVE NEGATIVE Final    Comment: (NOTE) SARS-CoV-2 target nucleic acids are NOT DETECTED.  The SARS-CoV-2 RNA is generally detectable in upper respiratory specimens during the acute phase of infection. The lowest concentration of SARS-CoV-2 viral copies this assay can detect is 138 copies/mL. A negative result does not preclude SARS-Cov-2 infection and should not be used as the sole basis for treatment or other patient management decisions. A negative result may occur with  improper specimen collection/handling, submission of specimen other than nasopharyngeal swab, presence of viral mutation(s) within the areas targeted by this assay, and inadequate number of viral copies(<138 copies/mL). A negative result must be combined with clinical observations, patient history, and epidemiological information. The expected result is Negative.  Fact Sheet for Patients:  EntrepreneurPulse.com.au  Fact Sheet for Healthcare Providers:  IncredibleEmployment.be  This test is no t yet approved or cleared by the [place] FDA and  has been authorized for detection and/or diagnosis of SARS-CoV-2 by FDA under an Emergency Use Authorization (EUA). This EUA will remain  in effect (meaning this test can be used) for the duration of the COVID-19 declaration under Section 564(b)(1) of the Act, 21 U.S.C.section 360bbb-3(b)(1), unless the authorization is terminated  or revoked sooner.       Influenza A by PCR NEGATIVE NEGATIVE Final   Influenza B by PCR NEGATIVE NEGATIVE Final    Comment: (NOTE) The Xpert Xpress SARS-CoV-2/FLU/RSV plus assay is intended as an aid in the diagnosis of influenza from Nasopharyngeal swab specimens and should not be used as a sole basis for treatment. Nasal washings and aspirates are unacceptable for Xpert Xpress SARS-CoV-2/FLU/RSV testing.  Fact Sheet for Patients: EntrepreneurPulse.com.au  Fact Sheet  for Healthcare Providers: IncredibleEmployment.be  This test is not yet approved or cleared by the [place] FDA and has been authorized for detection and/or diagnosis of SARS-CoV-2 by FDA under an Emergency Use Authorization (EUA). This EUA will remain in effect (meaning this test can be used) for the duration of the COVID-19 declaration under Section 564(b)(1) of the Act, 21 U.S.C. section 360bbb-3(b)(1), unless the authorization is terminated or revoked.  Performed at [facility], [address] [place], [place] [address]     IMAGING RESULTS:  Chest x-ray cardiomegaly with vascular congestion CT of the abdomen was normal CT of the right leg Shows moderate to severe shows marked severity subcutaneous intramuscular inflammatory fat stranding along the posterior lateral and anteromedial aspects of the right leg below the level of the knee.  There is no fluid collection or abscess. Surgically repaired acute nondisplaced fracture of the proximal and distal portion of the right tibial shaft.  An acute nondisplaced fracture of the neck of the proximal right fibula is seen.  Metallic density intramedullary rod is seen extending through the length of the right. I have personally reviewed the films ? Impression/Recommendation 70 year old female with multiple comorbidities, recent prolonged hospitalization at [facility] for an open fracture of the right tibia needing internal fixation and debridement of the open wound next to  the fracture site.  She was discharged from [facility] on 11/25/2021 and presents to [facility] on 11/26/2021 with vomiting, fever  Right leg wound infection with surrounding cellulitis. She has underlying ORIF for  an open fracture of the tibia. She has history of multiple falls with wounds on both legs Past history of MRSA in the wound culture from July 2022. She is at high risk for MRSA infection.  Risk for osteomyelitis and hardware infection Will  culture the wound. Continue vancomycin and cefepime  Hypertension/  On carvedilol, furosemide, irbesartan, hydralazine  Anemia  History of CKD but creatinine is normal now.  Watch closely as she is on vancomycin.   Diabetes mellitus has an insulin pump  Hypothyroidism on Synthroid  Peripheral neuropathy on gabapentin On pramipexole  Patient is currently on enteric precautions.  She denies any diarrhea.  If none in the next 12 hours will DC enteric precautions  Discussed with patient, and her daughter and pt seen with Dr.Hooten- They [facility]  Note:  This document was prepared using Dragon voice recognition software and may include unintentional dictation errors.

## 2021-11-27 NOTE — Progress Notes (Signed)
PT Cancellation Note  Patient Details Name: Brittney [name] MRN: [id number] DOB: 05-13-1951   Cancelled Treatment:    Reason Eval/Treat Not Completed: Patient adamantly declined to participate with PT evaluation despite multiple attempts, encouragement, and education on importance of mobility.  Pt stated she could not tolerate any RLE movement and refused even to attempt to sit EOB with assistance.  Will attempt to see pt at a future date/time as medically appropriate.      Linus Salmons PT, DPT 11/27/21, 3:37 PM

## 2021-11-27 NOTE — Progress Notes (Signed)
OT Cancellation Note  Patient Details Name: Brittney [name] MRN: [id number] DOB: 04/19/1951   Cancelled Treatment:    Reason Eval/Treat Not Completed: Other (comment) Pt adamantly refuses OT evaluation (even just bed level assessment or attempts to come to long sitting) citing pain, despite extensive education and encouragement. Even when OT states that pt will be checked on tomorrow, she becomes tearful. She ultimately states "you're coming at me with too much right now." OT will f/u next available date/time as able/pt agreeable. Thank you.  Gerrianne Scale, [place], OTR/L ascom [phone] 11/27/21, 4:30 PM

## 2021-11-27 NOTE — Progress Notes (Signed)
Inpatient Diabetes Program Recommendations  AACE/ADA: New Consensus Statement on Inpatient Glycemic Control   Target Ranges:  Prepandial:   less than 140 mg/dL      Peak postprandial:   less than 180 mg/dL (1-2 hours)      Critically ill patients:  140 - 180 mg/dL    Latest Reference Range & Units 11/26/21 21:54 11/27/21 07:43  Glucose-Capillary 70 - 99 mg/dL 179 (H) 158 (H)    Latest Reference Range & Units 11/26/21 15:15 11/27/21 05:57  Glucose 70 - 99 mg/dL 274 (H) 155 (H)   Review of Glycemic Control  Diabetes history: DM2 Outpatient Diabetes medications: Medtronic 630G insulin pump with Humulin R U500 Current orders for Inpatient glycemic control: Novolog 0-20 units TID with meals, Novolog 0-5 units QHS  Inpatient Diabetes Program Recommendations:    Insulin: Please discontinue Novolog correction insulin and order Insulin Pump AC&HS and 2am.  NOTE: Spoke with patient at bedside regarding diabetes and home regimen for diabetes management.  Patient is followed by Dr. Gabriel Carina [facility]) and was last seen on 10/19/21.  Patient uses a Medtronic 630G insulin pump with Humulin R U500 insulin as an outpatient. Patient has insulin pump attached and currently using it for DM control. Patient prefers to use her insulin pump for DM control while inpatient. Patient states she has extra insulin pump supplies here to the hospital and she plans to have her daughter bring her Humulin R U500 insulin so she will have when she needs to change out insulin pump infusion site, tubing, and reservoir.  Reviewed insulin pump settings which are as follow:   Basal insulin (using Humulin R U500 insulin) 12A 1.2 units/hour 11A 1.75 units/hour 1030P 1.35 units/hour Total daily basal insulin:35.35 units/24 hours  Carb Coverage 1:5 1 unit for every 5 grams of carbohydrates  Insulin Sensitivity 1:35 1 unit drops blood glucose 35 mg/dl  Target Glucose Goals 12A 120-150 mg/dl  Active insulin  time: 5 hours  In talking with the patient she states that her blood glucose normally runs very good and her last A1C was 6.4%.  Patient states that she rarely needs to do a bolus as her basal rates typically keep glucose controlled so she does not need to do a correction or cover carbohydrates very often. Explained to patient that if she is allowed to use her insulin pump while inpatient, nursing staff will check her glucose and she will need to enter the value in her pump and do a correction if needed. Patient is agreeable to do insulin bolus for correction if needed. Informed patient that diabetes coordinator team will be on call over the weekend (8am-5pm) and if she has any issues or concerns regarding DM control to ask nursing staff to page our team.  Patient verbalized understanding of information discussed and states that he does not have any further questions related to diabetes at this time.  NURSING: Once insulin pump order set is ordered please print off the Patient insulin pump contract and flow sheet. The insulin pump contract should be signed by the patient and then placed in the chart. The patient insulin pump flow sheet will be completed by the patient at the bedside and the RN caring for the patient will use the patient's flow sheet to document in the [facility]. RN will need to complete the Nursing Insulin Pump Flowsheet at least once a shift. Patient will need to keep extra insulin pump supplies at the bedside at all times.   Thanks, Lelan Pons  Felton Clinton, RN, MSN, CDE Diabetes Coordinator Inpatient Diabetes Program [phone] (Team Pager from 8am to 5pm)

## 2021-11-27 NOTE — ED Notes (Signed)
Diabetes coordinator contacted about pts insulin pump and ss insulin. She will come see pt. Holding ss insulin until then.

## 2021-11-28 DIAGNOSIS — S82831E Other fracture of upper and lower end of right fibula, subsequent encounter for open fracture type I or II with routine healing: Secondary | ICD-10-CM

## 2021-11-28 LAB — GLUCOSE, CAPILLARY
Glucose-Capillary: 115 mg/dL — ABNORMAL HIGH (ref 70–99)
Glucose-Capillary: 107 mg/dL — ABNORMAL HIGH (ref 70–99)

## 2021-11-28 LAB — CBC
HCT: 25 % — ABNORMAL LOW (ref 36.0–46.0)
Hemoglobin: 7.7 g/dL — ABNORMAL LOW (ref 12.0–15.0)
MCH: 27.6 pg (ref 26.0–34.0)
MCHC: 30.8 g/dL (ref 30.0–36.0)
MCV: 89.6 fL (ref 80.0–100.0)
Platelets: 275 10*3/uL (ref 150–400)
RBC: 2.79 MIL/uL — ABNORMAL LOW (ref 3.87–5.11)
RDW: 15.2 % (ref 11.5–15.5)
WBC: 6.6 10*3/uL (ref 4.0–10.5)
nRBC: 0 % (ref 0.0–0.2)

## 2021-11-28 LAB — RENAL FUNCTION PANEL
Albumin: 2.1 g/dL — ABNORMAL LOW (ref 3.5–5.0)
Anion gap: 4 — ABNORMAL LOW (ref 5–15)
BUN: 17 mg/dL (ref 8–23)
CO2: 32 mmol/L (ref 22–32)
Calcium: 11.4 mg/dL — ABNORMAL HIGH (ref 8.9–10.3)
Chloride: 96 mmol/L — ABNORMAL LOW (ref 98–111)
Creatinine, Ser: 0.76 mg/dL (ref 0.44–1.00)
GFR, Estimated: >60 mL/min (ref >60)
Glucose, Bld: 92 mg/dL (ref 70–99)
Phosphorus: 2.8 mg/dL (ref 2.5–4.6)
Potassium: 3.5 mmol/L (ref 3.5–5.1)
Sodium: 132 mmol/L — ABNORMAL LOW (ref 135–145)

## 2021-11-28 LAB — HEMOGLOBIN A1C
Hgb A1c MFr Bld: 6.2 % — ABNORMAL HIGH (ref 4.8–5.6)
Mean Plasma Glucose: 131 mg/dL

## 2021-11-28 LAB — MRSA NEXT GEN BY PCR, NASAL: MRSA by PCR Next Gen: NOT DETECTED

## 2021-11-28 LAB — MAGNESIUM: Magnesium: 1.7 mg/dL (ref 1.7–2.4)

## 2021-11-28 MED ORDER — ACETAMINOPHEN 650 MG RE SUPP
650.0000 mg | Freq: Four times a day (QID) | RECTAL | 0 refills | Status: AC | PRN
Start: 2021-11-28 — End: ?

## 2021-11-28 MED ORDER — POTASSIUM CHLORIDE CRYS ER 20 MEQ PO TBCR
40.0000 meq | EXTENDED_RELEASE_TABLET | Freq: Once | ORAL | Status: AC
  Administered 2021-11-28: 40 meq via ORAL
  Filled 2021-11-28: qty 2

## 2021-11-28 MED ORDER — HYDROMORPHONE HCL 1 MG/ML IJ SOLN: 0.5000 mg | INTRAMUSCULAR | 0 refills | Status: DC | PRN

## 2021-11-28 MED ORDER — ZOLEDRONIC ACID 4 MG/5ML IV CONC
4.0000 mg | Freq: Once | INTRAVENOUS | Status: AC
  Administered 2021-11-28: 4 mg via INTRAVENOUS
  Filled 2021-11-28 (×2): qty 5

## 2021-11-28 MED ORDER — OXYCODONE HCL 5 MG PO TABS: 5.0000 mg | ORAL_TABLET | ORAL | 0 refills | Status: DC | PRN

## 2021-11-28 MED ORDER — MAGNESIUM SULFATE 2 GM/50ML IV SOLN
2.0000 g | Freq: Once | INTRAVENOUS | Status: AC
  Administered 2021-11-28: 2 g via INTRAVENOUS
  Filled 2021-11-28: qty 50

## 2021-11-28 MED ORDER — PROMETHAZINE HCL 25 MG/ML IJ SOLN: 12.5000 mg | Freq: Four times a day (QID) | INTRAMUSCULAR | Status: DC | PRN

## 2021-11-28 MED ORDER — LEVOTHYROXINE SODIUM 200 MCG PO TABS: 200.0000 ug | ORAL_TABLET | Freq: Every day | ORAL | Status: AC

## 2021-11-28 MED ORDER — OXYCODONE HCL 10 MG PO TABS: 10.0000 mg | ORAL_TABLET | ORAL | 0 refills | Status: DC | PRN

## 2021-11-28 MED ORDER — HYDROMORPHONE HCL 1 MG/ML IJ SOLN
0.5000 mg | INTRAMUSCULAR | Status: DC | PRN
  Administered 2021-11-28 (×2): 0.5 mg via INTRAVENOUS
  Filled 2021-11-28 (×2): qty 1

## 2021-11-28 MED ORDER — CLONIDINE 0.3 MG/24HR TD PTWK: 0.3000 mg | MEDICATED_PATCH | TRANSDERMAL | 12 refills | Status: DC

## 2021-11-28 MED ORDER — ALBUTEROL SULFATE (2.5 MG/3ML) 0.083% IN NEBU: 2.5000 mg | INHALATION_SOLUTION | Freq: Three times a day (TID) | RESPIRATORY_TRACT | 12 refills | Status: DC | PRN

## 2021-11-28 MED ORDER — ONDANSETRON HCL 4 MG PO TABS: 4.0000 mg | ORAL_TABLET | Freq: Four times a day (QID) | ORAL | 0 refills | Status: AC | PRN

## 2021-11-28 MED ORDER — VANCOMYCIN HCL 2000 MG/400ML IV SOLN: 2000.0000 mg | INTRAVENOUS | Status: DC

## 2021-11-28 MED ORDER — ACETAMINOPHEN 500 MG PO TABS: 1000.0000 mg | ORAL_TABLET | Freq: Four times a day (QID) | ORAL | 0 refills | Status: AC | PRN

## 2021-11-28 MED ORDER — SODIUM CHLORIDE 0.9 % IV SOLN: 2.0000 g | Freq: Three times a day (TID) | INTRAVENOUS | Status: DC

## 2021-11-28 MED ORDER — LINACLOTIDE 145 MCG PO CAPS: 145.0000 ug | ORAL_CAPSULE | Freq: Every day | ORAL | 0 refills | Status: AC | PRN

## 2021-11-28 MED ORDER — HYDRALAZINE HCL 50 MG PO TABS: 50.0000 mg | ORAL_TABLET | Freq: Three times a day (TID) | ORAL | Status: DC

## 2021-11-28 MED ORDER — GABAPENTIN 600 MG PO TABS: 1200.0000 mg | ORAL_TABLET | Freq: Every day | ORAL | Status: DC

## 2021-11-28 MED ORDER — ZOLEDRONIC ACID 5 MG/100ML IV SOLN
Freq: Once | INTRAVENOUS | Status: DC
  Filled 2021-11-28: qty 100

## 2021-11-28 MED ORDER — LABETALOL HCL 5 MG/ML IV SOLN: 5.0000 mg | INTRAVENOUS | Status: DC | PRN

## 2021-11-28 MED ORDER — ENOXAPARIN SODIUM 80 MG/0.8ML IJ SOSY: 0.5000 mg/kg | PREFILLED_SYRINGE | INTRAMUSCULAR | Status: DC

## 2021-11-28 MED ORDER — ONDANSETRON HCL 4 MG/2ML IJ SOLN: 4.0000 mg | Freq: Four times a day (QID) | INTRAMUSCULAR | 0 refills | Status: DC | PRN

## 2021-11-28 NOTE — Progress Notes (Signed)
PT Cancellation Note  Patient Details Name: Brittney [name] MRN: [id number] DOB: [birth year]   Cancelled Treatment:    Reason Eval/Treat Not Completed: Pain limiting ability to participate (Evaluation re-attempted.  Patient continues to decline, citing pain (exacerbated with recent dressing change).  Will continue efforts next date as medically appropriate and available.)  Of note, nothing discussion of possible transfer to [place].  Will continue to follow for updates.  Jimmie Rueter H. Owens Shark, PT, DPT, NCS 11/28/21, 11:40 AM [phone]

## 2021-11-28 NOTE — Discharge Summary (Signed)
Physician Discharge Summary  Brittney [name] PFX:[id number] DOB: August 21, 1951 DOA: 11/26/2021  PCP: Rusty Aus, MD  Admit date: 11/26/2021 Discharge date: 11/28/2021 Admitted From: Home. Disposition: [facility]  Discharge Condition: Stable for transfer to [place]: Full code  Hospital Course: 70 year old F with PMH of IDDM-2 with gastroparesis, neuropathy, retinopathy on insulin pump, CVA, morbid obesity, OSA on BiPAP, HTN, HLD, GIB, gastric polyp/AVM, anxiety, depression, chronic pain, hypothyroidism, osteoporosis and recent hospitalization at [facility] from 11/4-11/30 for right tibial and fibular fracture for which she had IM implant and discharged home after she refused SNF, came to ED with nausea, vomiting and diarrhea, and admitted for sepsis due to right lower extremity cellulitis.  Patient was febrile to 103.3 F.  Has markedly elevated CRP to 23.  Lactic acid and procalcitonin negative.  She has no leukocytosis.  PA chest/abdomen/pelvis negative for PE but suggestive of liver cirrhosis.  Right lower extremity x-ray suggesting cellulitis and acute nondisplaced right proximal fibula fracture, although this is not new per chart review.  Blood cultures drawn.  C. difficile and GI panel ordered but patient has not had further bowel movements.  Orthopedic surgery consulted.  Patient was started on vancomycin and cefepime, and admitted.  ID consulted for guidance on antibiotics.  On 11/28/2021, orthopedic surgery recommended transfer to [facility] when she had her care for her right leg fracture.  Patient is transferred to [facility] orthopedic surgery team for further care.  Dr. Rolly Salter accepting.   See individual problem list below for more on hospital course.  Discharge Diagnoses:  Sepsis due to possible right lower extremity cellulitis:POA: Had fever and increased RR on presentation.  Patient with recent right tibial and fibular fracture s/p ORIF at [facility] on 11/5.  She has RLE wound.  No  significant erythema or increased warmth to touch.  However, she was febrile to 103.3 on arrival.  Imaging suggestive of cellulitis.  No clinical or radiologic evidence of fluid collection to suggest abscess.  Hemodynamically stable.  Lactic acid and Pro-Cal within normal.  She has no leukocytosis either.  Blood cultures NGTD.  CRP elevated.  Superficial wound culture with few GPC's. -Appreciate help by orthopedic surgery and ID. -Continue IV vancomycin and cefepime  -Patient has been refusing dressing change by RN asking for WOCN to do.   Right tibial shaft and proximal fibular fracture -Status post ORIF at [facility] on 11/5. -Orthopedic surgery recommended transfer back to [facility] given complexity -Dr. Rolly Salter with orthopedic surgery team at [facility] accepted transfer.    Intractable nausea/vomiting/diarrhea: Resolved.  CT A/P suggested liver cirrhosis but no acute finding.  No further N/V/B/D since arrival.  Abdominal exam benign. -Enteric precautions discontinued by ID.   Controlled IDDM-2 with hyperglycemia, hyperlipidemia, nephropathy, retinopathy and polyneuropathy: A1c 6.4% on 10/19/2021.  On insulin pump at home.  Followed by endocrinology outpatient. Last Labs          Recent Labs  Lab 11/27/21 0944 11/27/21 1106 11/27/21 1530 11/27/21 2118 11/28/21 0913  GLUCAP 269* 233* 217* 220* 115*    -Appreciate diabetic coronary to help with her insulin pump. -May have to switch to SSI-if she remains hyperglycemic -Resumed home gabapentin at reduced dose-increase from 600 to 900 mg nightly.  -Continue statin.   Hypercalcemia: Concern about primary hypothyroidism when she was at [facility].  Vitamin D was within normal.  She is also on vitamin D supplementation.  She was advised to follow-up with endocrinology.  I do not see PTH  checked. -Follow PTH level -Continue home Lasix -Stopped vitamin D indefinitely. -Zometa 4 mg x 1.   Iron deficiency anemia: Recent EGD at [facility] showed gastric polyps and  nonbleeding AVM that was cauterized.  LDH and haptoglobin were within normal.  She received iron infusions at [facility].  No report of melena or hematochezia.  Hgb was 8.1 on discharge. Recent Labs (within last 365 days)             Recent Labs    02/02/21 1333 05/14/21 1410 07/07/21 1547 07/09/21 0429 07/17/21 1941 10/30/21 1045 11/26/21 1515 11/27/21 0557 11/28/21 0435  HGB 11.7* 12.5 10.7* 10.4* 11.1* 11.3* 8.9* 8.0* 7.7*    -H&H stable -Continue monitoring   Resistant hypertension: SBP in 150s. -Continue Avapro and instead of home Micardis. -Increased hydralazine from 50 mg twice daily to 50 mg 3 times daily. -Continue home Lasix and clonidine patch. -Continue home Coreg.   Anxiety/depression/insomnia: Stable. -Continue home Celexa, trazodone and Xanax.   Hyponatremia: Looks hypervolemic but difficult to assess fluid status due to body habitus. -Lasix as above -Continue monitoring   OSA/possible OHS on BiPAP -Nightly BiPAP. -Minimize sedating medications.   Chronic pain syndrome: Seems to be on Flexeril, tramadol, gabapentin and oxycodone -Resume gabapentin at reduced dose .  Increase to 900 mg nightly. -Oxycodone and IV Dilaudid as needed based on pain severity -Continue home Linzess for constipation.   Ambulatory dysfunction-patient uses power chair at home.  She was recommended SNF [facility] but decided to go home.  -PT/OT eval-May need placement.   At risk for polypharmacy: on significant/high dose of Flexeril, Ambien, trazodone, tramadol, Xanax, oxycodone and gabapentin -Holding Ambien and Flexeril. -Reduced gabapentin   Morbid obesity-difficult situation. Body mass index is 51.6 kg/m.           Discharge Exam: Vitals:   11/27/21 2121 11/28/21 0447 11/28/21 0819 11/28/21 1138  BP: (!) 142/42 (!) 146/64 (!) 152/56 (!) 151/62  Pulse: 65 64 65 77  Temp: 98.9 F (37.2 C) 99 F (37.2 C) 98 F (36.7 C) 98.2 F (36.8 C)  Resp: 20 16 19 19    Height:      Weight:      SpO2: 98% 99% 99% 99%  TempSrc: Oral     BMI (Calculated):         GENERAL: No apparent distress.  Nontoxic. HEENT: MMM.  Vision and hearing grossly intact.  NECK: Supple.  No apparent JVD.  RESP:  No IWOB.  Fair aeration bilaterally. CVS:  RRR. Heart sounds normal.  ABD/GI/GU: BS+. Abd soft, NTND.  MSK/EXT:  Moves extremities. No apparent deformity.  1-2 edema in BLE SKIN: Stage III RLE wound.  Surgical wound appears to be clean and dry.  No significant erythema or increased warmth to touch.  See pictures under media for more.  Orthopedic surgery  NEURO: Awake and alert. Oriented appropriately.  No apparent focal neuro deficit. PSYCH: Calm. Normal affect.   Discharge Instructions  Discharge Instructions     Diet - low sodium heart healthy   Complete by: As directed    Diet Carb Modified   Complete by: As directed    Discharge wound care:   Complete by: As directed    1. Apply xeroform gauze to red or dry scabbed wounds on bilat legs Q day, then cover with ABD pads and kerlex  2. Apply Aquacel Kellie Simmering # [phone]) to right lower leg wound Q day, then cover with ABD pad and kerlex  Increase activity slowly   Complete by: As directed       Allergies as of 11/28/2021       Reactions   Shellfish Allergy Nausea And Vomiting, Diarrhea, Nausea Only   Non stop sickness once ingests shellfish. Betadine is okay   Cephalexin Other (See Comments)   Doesn't digest it well   Amlodipine Cough   Codeine Nausea And Vomiting, Nausea Only   states she can take codeine cough syrup without problems states she can take codeine cough syrup without problems   Imdur [isosorbide Dinitrate] Other (See Comments), Cough   Headache   Lyrica [pregabalin] Swelling, Other (See Comments)   "FLU-LIKE" SYMPTOMS   Monosodium Glutamate Other (See Comments), Cough   Headache   Tape Other (See Comments), Nausea And Vomiting   can use paper tape Redness        Medication  List     STOP taking these medications    albuterol 108 (90 Base) MCG/ACT inhaler Commonly known as: VENTOLIN HFA Replaced by: albuterol (2.5 MG/3ML) 0.083% nebulizer solution   aspirin EC 81 MG tablet   calcium carbonate 750 MG chewable tablet Commonly known as: TUMS EX   cyclobenzaprine 10 MG tablet Commonly known as: FLEXERIL   diclofenac Sodium 1 % Gel Commonly known as: VOLTAREN   HySept 0.25 % Soln Generic drug: Sodium Hypochlorite   ibuprofen 800 MG tablet Commonly known as: ADVIL   Klor-Con M10 10 MEQ tablet Generic drug: potassium chloride   magnesium oxide 400 MG tablet Commonly known as: MAG-OX   NONFORMULARY OR COMPOUNDED ITEM   oxyCODONE-acetaminophen 10-325 MG tablet Commonly known as: Percocet   traMADol 50 MG tablet Commonly known as: ULTRAM   vitamin C 500 MG tablet Commonly known as: ASCORBIC ACID   Vitamin D-3 25 MCG (1000 UT) Caps   zolpidem 10 MG tablet Commonly known as: AMBIEN       TAKE these medications    acetaminophen 500 MG tablet Commonly known as: TYLENOL Take 2 tablets (1,000 mg total) by mouth every 6 (six) hours as needed for mild pain (or Fever >/= 101). What changed:  medication strength how much to take when to take this reasons to take this   acetaminophen 650 MG suppository Commonly known as: TYLENOL Place 1 suppository (650 mg total) rectally every 6 (six) hours as needed for mild pain (or Fever >/= 101). What changed: You were already taking a medication with the same name, and this prescription was added. Make sure you understand how and when to take each.   albuterol (2.5 MG/3ML) 0.083% nebulizer solution Commonly known as: PROVENTIL Take 3 mLs (2.5 mg total) by nebulization 3 (three) times daily as needed for wheezing or shortness of breath. Replaces: albuterol 108 (90 Base) MCG/ACT inhaler   budesonide 0.5 MG/2ML nebulizer solution Commonly known as: PULMICORT Take 2 mLs by nebulization daily as  needed (Chronic bronchitis).   carvedilol 25 MG tablet Commonly known as: COREG Take 50 mg by mouth 2 (two) times daily with a meal.   ceFEPIme 2 g in sodium chloride 0.9 % 100 mL Inject 2 g into the vein every 8 (eight) hours.   cloNIDine 0.3 mg/24hr patch Commonly known as: CATAPRES - Dosed in mg/24 hr Place 1 patch (0.3 mg total) onto the skin once a week. Start taking on: December 04, 2021   enoxaparin 80 MG/0.8ML injection Commonly known as: LOVENOX Inject 0.725 mLs (72.5 mg total) into the skin daily. Start taking on: November 29, 2021 What changed:  medication strength how much to take when to take this   escitalopram 10 MG tablet Commonly known as: LEXAPRO Take 10 mg by mouth daily.   ferrous sulfate 325 (65 FE) MG tablet Take 325 mg by mouth daily with breakfast.   fluticasone 50 MCG/ACT nasal spray Commonly known as: FLONASE Place 2 sprays into both nostrils daily as needed for allergies.   furosemide 40 MG tablet Commonly known as: LASIX Take 40 mg by mouth daily as needed for fluid or edema.   gabapentin 600 MG tablet Commonly known as: NEURONTIN Take 2 tablets (1,200 mg total) by mouth at bedtime. What changed:  how much to take how to take this when to take this additional instructions   hydrALAZINE 50 MG tablet Commonly known as: APRESOLINE Take 50 mg by mouth 2 (two) times daily. What changed: Another medication with the same name was added. Make sure you understand how and when to take each.   hydrALAZINE 50 MG tablet Commonly known as: APRESOLINE Take 1 tablet (50 mg total) by mouth every 8 (eight) hours. What changed: You were already taking a medication with the same name, and this prescription was added. Make sure you understand how and when to take each.   HYDROmorphone 1 MG/ML injection Commonly known as: DILAUDID Inject 0.5 mLs (0.5 mg total) into the vein every 4 (four) hours as needed (Breakthrough pain).   insulin pump Soln Inject  into the skin continuous. Humulin R 100   ipratropium-albuterol 0.5-2.5 (3) MG/3ML Soln Commonly known as: DUONEB Take 3 mLs by nebulization every 6 (six) hours as needed (bronchitis).   labetalol 5 MG/ML injection Commonly known as: NORMODYNE Inject 1 mL (5 mg total) into the vein every 3 (three) hours as needed (sbp > 180).   levothyroxine 200 MCG tablet Commonly known as: SYNTHROID Take 1 tablet (200 mcg total) by mouth daily at 6 (six) AM. Start taking on: November 29, 2021 What changed: when to take this   linaclotide 145 MCG Caps capsule Commonly known as: LINZESS Take 1 capsule (145 mcg total) by mouth daily as needed (Constipation).   metolazone 5 MG tablet Commonly known as: ZAROXOLYN Take 5 mg by mouth 2 (two) times a week.   ondansetron 4 MG tablet Commonly known as: ZOFRAN Take 1 tablet (4 mg total) by mouth every 6 (six) hours as needed for nausea. What changed:  when to take this reasons to take this   ondansetron 4 MG/2ML Soln injection Commonly known as: ZOFRAN Inject 2 mLs (4 mg total) into the vein every 6 (six) hours as needed for nausea. What changed: You were already taking a medication with the same name, and this prescription was added. Make sure you understand how and when to take each.   oxyCODONE 5 MG immediate release tablet Commonly known as: Oxy IR/ROXICODONE Take 1 tablet (5 mg total) by mouth every 4 (four) hours as needed for moderate pain. What changed:  how much to take when to take this reasons to take this   Oxycodone HCl 10 MG Tabs Take 1 tablet (10 mg total) by mouth every 4 (four) hours as needed for severe pain. What changed: You were already taking a medication with the same name, and this prescription was added. Make sure you understand how and when to take each.   pantoprazole 40 MG tablet Commonly known as: PROTONIX Take 40 mg by mouth 2 (two) times daily.   pramipexole 0.5 MG tablet Commonly  known as: MIRAPEX Take 1 mg  by mouth at bedtime.   pravastatin 20 MG tablet Commonly known as: PRAVACHOL Take 20 mg by mouth daily.   sodium chloride 0.9 % SOLN 50 mL with promethazine 25 MG/ML SOLN 12.5 mg Inject 12.5 mg into the vein every 6 (six) hours as needed.   telmisartan 40 MG tablet Commonly known as: MICARDIS Take 40 mg by mouth daily.   traZODone 150 MG tablet Commonly known as: DESYREL Take 150 mg by mouth at bedtime.   vancomycin HCl 2000 MG/400ML Soln Commonly known as: VANCOREADY Inject 400 mLs (2,000 mg total) into the vein daily.   Xanax 0.5 MG tablet Generic drug: ALPRAZolam Take 0.5 mg by mouth 3 (three) times daily.               Discharge Care Instructions  (From admission, onward)           Start     Ordered   11/28/21 0000  Discharge wound care:       Comments: 1. Apply xeroform gauze to red or dry scabbed wounds on bilat legs Q day, then cover with ABD pads and kerlex  2. Apply Aquacel Kellie Simmering # [phone]) to right lower leg wound Q day, then cover with ABD pad and kerlex   11/28/21 1319            Consultations: Orthopedic surgery Infectious disease  Procedures/Studies:   DG Tibia/Fibula Right  Result Date: 10/30/2021 CLINICAL DATA:  Post splinting EXAM: RIGHT TIBIA AND FIBULA - 2 VIEW COMPARISON:  10/30/2021 FINDINGS: Transverse fracture proximal tibia with mild displacement unchanged. Nondisplaced fracture proximal fibula. Oblique/spiral fracture distal tibia with displacement, unchanged. This is above the ankle joint. Degenerative change in the knee and ankle Interval splinting. IMPRESSION: Displaced fractures proximal tibia and distal tibia unchanged in position post splinting. Electronically Signed   By: Franchot Gallo M.D.   On: 10/30/2021 14:10   DG Tibia/Fibula Right  Result Date: 10/30/2021 CLINICAL DATA:  70 year old female status post fall. EXAM: RIGHT TIBIA AND FIBULA - 2 VIEW COMPARISON:  Right femur series today. Right tib fib series  07/07/2021. FINDINGS: Chronic right knee joint space loss and degenerative osteophytosis. Comminuted right tibia proximal metadiaphysis fracture with only slight lateral displacement. Superimposed spiral fracture distal right tibia shaft terminating about 7 cm proximal to the plafond. Mild anterior, lateral displacement and over-riding. Comminuted right fibula proximal metadiaphysis fracture. No distal fibula fracture identified. Ankle joint alignment appears maintained. Foot and calcaneus degenerative spurring. IMPRESSION: 1. Comminuted but largely nondisplaced right tibia proximal metadiaphysis fracture, with superimposed spiral fracture of the distal shaft with mild displacement and over-riding. 2. Comminuted proximal fibula metadiaphysis fracture. Distal fibula and ankle joint alignment appear intact. Electronically Signed   By: Genevie Ann M.D.   On: 10/30/2021 11:50   CT Angio Chest PE W and/or Wo Contrast  Result Date: 11/26/2021 CLINICAL DATA:  Patient presents via EMS from home when daughter called EMS due to pt vomiting excessively. Pt denies abdominal pain but c/o right leg pain 8/10. Vomiting began last night and pt is febrile today. Pt has had some diarrhea past few nights. Pt c/o weakness for a few days. Pt was discharged last night from [place] after having surgery for a broken right leg. Pt unaware of being febrile last night at discharge.Pt originally admitted 10/30/2021 when fell. Pt's temp was 102.5 oral via EMS and pt had low SPO2 in 80's prior to oxygen via nasal cannula. Pt  baseline sat on presentation on room air 89% and improved to 97% on 2L/min O2 via nasal cannula. Pt normally only wears O2 at night (3L/min). Pt denies SOB. EXAM: CT ANGIOGRAPHY CHEST CT ABDOMEN AND PELVIS WITH CONTRAST TECHNIQUE: Multidetector CT imaging of the chest was performed using the standard protocol during bolus administration of intravenous contrast. Multiplanar CT image reconstructions and MIPs were obtained  to evaluate the vascular anatomy. Multidetector CT imaging of the abdomen and pelvis was performed using the standard protocol during bolus administration of intravenous contrast. CONTRAST:  172mL OMNIPAQUE IOHEXOL 350 MG/ML SOLN COMPARISON:  Abdomen and pelvis CT, 04/13/1999 FINDINGS: CTA CHEST FINDINGS Cardiovascular: Opacification of the central pulmonary arteries is satisfactory. There is heterogeneous opacification of the segmental and smaller vessels, also somewhat limited by respiratory motion. Allowing for this limitation, there is no evidence of a pulmonary embolism. Heart is mildly enlarged. No pericardial effusion. Three-vessel coronary artery calcifications. Great vessels are normal in caliber. No aortic dissection. Mild aortic atherosclerosis. Mediastinum/Nodes: No neck base, mediastinal or hilar masses or enlarged lymph nodes. Trachea and esophagus are unremarkable. Lungs/Pleura: Minor subsegmental atelectasis in the dependent lower lobes. No evidence of pneumonia or pulmonary edema. No mass or suspicious nodule. No pleural effusion or pneumothorax. Musculoskeletal: No fracture. No bone lesion. Subcutaneous mass, anterior mid chest consistent with a sebaceous cyst. Review of the MIP images confirms the above findings. CT ABDOMEN and PELVIS FINDINGS Hepatobiliary: Liver normal in size. There is surface nodularity consistent with cirrhosis. No liver mass. Status post cholecystectomy. No bile duct dilation. Pancreas: Unremarkable. No pancreatic ductal dilatation or surrounding inflammatory changes. Spleen: Normal in size without focal abnormality. Adrenals/Urinary Tract: No adrenal masses. Kidneys normal size, orientation and position. Small nonobstructing stones in the left kidney, mid to upper pole. No right intrarenal stones. No renal masses. No hydronephrosis. Normal ureters. Normal bladder. Stomach/Bowel: Normal stomach. Small bowel and colon are normal in caliber. No wall thickening. No  inflammation. No evidence of appendicitis. Vascular/Lymphatic: Aortic atherosclerosis. No aneurysm. Prominent gastrohepatic ligament lymph nodes, largest 1 cm short axis, stable. No other adenopathy. Reproductive: Findings consistent with a hysterectomy. No pelvic masses. Other: No ascites. Musculoskeletal: Previous lumbar spine fusion, L3 through L5. Spine stimulator leads in the posterior lower thoracic spinal canal. No acute fracture. No bone lesion. Review of the MIP images confirms the above findings. IMPRESSION: CTA CHEST 1. Mildly limited study as detailed above. No evidence of a pulmonary embolism. 2. No acute findings in the chest. 3. Coronary artery calcifications and aortic atherosclerosis. CT ABDOMEN AND PELVIS 1. No acute findings within the abdomen or pelvis. 2. Liver morphologic changes consistent with cirrhosis, stable from prior CT. No liver masses. 3. Aortic atherosclerosis. Electronically Signed   By: Lajean Manes M.D.   On: 11/26/2021 19:16   CT ABDOMEN PELVIS W CONTRAST  Result Date: 11/26/2021 CLINICAL DATA:  Patient presents via EMS from home when daughter called EMS due to pt vomiting excessively. Pt denies abdominal pain but c/o right leg pain 8/10. Vomiting began last night and pt is febrile today. Pt has had some diarrhea past few nights. Pt c/o weakness for a few days. Pt was discharged last night from [place] after having surgery for a broken right leg. Pt unaware of being febrile last night at discharge.Pt originally admitted 10/30/2021 when fell. Pt's temp was 102.5 oral via EMS and pt had low SPO2 in 80's prior to oxygen via nasal cannula. Pt baseline sat on presentation on room air 89% and  improved to 97% on 2L/min O2 via nasal cannula. Pt normally only wears O2 at night (3L/min). Pt denies SOB. EXAM: CT ANGIOGRAPHY CHEST CT ABDOMEN AND PELVIS WITH CONTRAST TECHNIQUE: Multidetector CT imaging of the chest was performed using the standard protocol during bolus administration  of intravenous contrast. Multiplanar CT image reconstructions and MIPs were obtained to evaluate the vascular anatomy. Multidetector CT imaging of the abdomen and pelvis was performed using the standard protocol during bolus administration of intravenous contrast. CONTRAST:  114mL OMNIPAQUE IOHEXOL 350 MG/ML SOLN COMPARISON:  Abdomen and pelvis CT, 04/13/1999 FINDINGS: CTA CHEST FINDINGS Cardiovascular: Opacification of the central pulmonary arteries is satisfactory. There is heterogeneous opacification of the segmental and smaller vessels, also somewhat limited by respiratory motion. Allowing for this limitation, there is no evidence of a pulmonary embolism. Heart is mildly enlarged. No pericardial effusion. Three-vessel coronary artery calcifications. Great vessels are normal in caliber. No aortic dissection. Mild aortic atherosclerosis. Mediastinum/Nodes: No neck base, mediastinal or hilar masses or enlarged lymph nodes. Trachea and esophagus are unremarkable. Lungs/Pleura: Minor subsegmental atelectasis in the dependent lower lobes. No evidence of pneumonia or pulmonary edema. No mass or suspicious nodule. No pleural effusion or pneumothorax. Musculoskeletal: No fracture. No bone lesion. Subcutaneous mass, anterior mid chest consistent with a sebaceous cyst. Review of the MIP images confirms the above findings. CT ABDOMEN and PELVIS FINDINGS Hepatobiliary: Liver normal in size. There is surface nodularity consistent with cirrhosis. No liver mass. Status post cholecystectomy. No bile duct dilation. Pancreas: Unremarkable. No pancreatic ductal dilatation or surrounding inflammatory changes. Spleen: Normal in size without focal abnormality. Adrenals/Urinary Tract: No adrenal masses. Kidneys normal size, orientation and position. Small nonobstructing stones in the left kidney, mid to upper pole. No right intrarenal stones. No renal masses. No hydronephrosis. Normal ureters. Normal bladder. Stomach/Bowel: Normal  stomach. Small bowel and colon are normal in caliber. No wall thickening. No inflammation. No evidence of appendicitis. Vascular/Lymphatic: Aortic atherosclerosis. No aneurysm. Prominent gastrohepatic ligament lymph nodes, largest 1 cm short axis, stable. No other adenopathy. Reproductive: Findings consistent with a hysterectomy. No pelvic masses. Other: No ascites. Musculoskeletal: Previous lumbar spine fusion, L3 through L5. Spine stimulator leads in the posterior lower thoracic spinal canal. No acute fracture. No bone lesion. Review of the MIP images confirms the above findings. IMPRESSION: CTA CHEST 1. Mildly limited study as detailed above. No evidence of a pulmonary embolism. 2. No acute findings in the chest. 3. Coronary artery calcifications and aortic atherosclerosis. CT ABDOMEN AND PELVIS 1. No acute findings within the abdomen or pelvis. 2. Liver morphologic changes consistent with cirrhosis, stable from prior CT. No liver masses. 3. Aortic atherosclerosis. Electronically Signed   By: Lajean Manes M.D.   On: 11/26/2021 19:16   CT TIBIA FIBULA RIGHT W CONTRAST  Result Date: 11/26/2021 CLINICAL DATA:  Recent surgical intervention for a broken right leg, presenting with fever and right lower extremity pain and swelling. EXAM: CT OF THE LOWER RIGHT EXTREMITY WITH CONTRAST TECHNIQUE: Multidetector CT imaging of the lower right extremity was performed according to the standard protocol following intravenous contrast administration. CONTRAST:  128mL OMNIPAQUE IOHEXOL 350 MG/ML SOLN COMPARISON:  None. FINDINGS: Bones/Joint/Cartilage A metallic density intramedullary rod is seen extending through the length of the right tibia. Proximal and distal metallic density fixation screws are also noted. Surgically repaired, acute, nondisplaced fractures of the proximal and distal portions of the right tibial shaft are also seen. An acute, nondisplaced fracture of the neck of the proximal right fibula is  seen. There is  a chronic fracture deformity of the shaft of the distal right fibula, just above the level of the right lateral malleolus. There is no evidence of dislocation. Marked severity degenerative changes are seen within the right knee. Ligaments Suboptimally assessed by CT. Muscles and Tendons Unremarkable. Soft tissues A small joint effusion is seen within the visualized portion of the right knee. Areas of moderate to marked severity subcutaneous and para muscular inflammatory fat stranding are seen along the posterior, lateral and anteromedial aspects of the right leg below the level of the knee. There is no evidence of associated fluid collection or abscess. A 2.1 cm x 0.8 cm superficial soft tissue defect is seen adjacent to the medial aspect of the distal right tibial shaft (axial CT images 231 through 239, CT series 9). Adjacent subcutaneous inflammatory fat stranding and edema is noted which extends to the cortex of the tibia. No associated cortical destruction is seen. IMPRESSION: 1. Moderate to marked severity diffuse cellulitis, most prominent along the posterior, lateral and anteromedial aspects of the soft tissues below the right knee. 2. Focal soft tissue defect, and associated inflammatory changes, along the medial aspect of the distal right lower extremity without evidence of acute osteomyelitis. 3. Status post ORIF of the right tibia. 4. Acute nondisplaced fracture of the proximal right fibula. 5. Small right knee effusion. 6. Marked severity degenerative changes within the right knee. Electronically Signed   By: Virgina Norfolk M.D.   On: 11/26/2021 19:36   DG Chest Portable 1 View  Result Date: 11/26/2021 CLINICAL DATA:  Hypoxia fever EXAM: PORTABLE CHEST 1 VIEW COMPARISON:  07/10/2021 FINDINGS: Cardiomegaly with vascular congestion. No focal consolidation, pleural effusion, or pneumothorax. Aortic atherosclerosis. IMPRESSION: Cardiomegaly with vascular congestion Electronically Signed   By: Donavan Foil M.D.   On: 11/26/2021 15:56   DG Femur Min 2 Views Right  Result Date: 10/30/2021 CLINICAL DATA:  70 year old female status post fall. EXAM: RIGHT FEMUR 2 VIEWS COMPARISON:  CT Chest, Abdomen, and Pelvis 07/09/2008. right knee series 07/30/2019 FINDINGS: Right femoral head is normally located. A visible right hemipelvis appears intact. Proximal right femur is intact. The right femoral shaft is intact. Partially visible comminuted fracture of the proximal tibia and fibula. And superimposed chronic knee joint space loss and osteophytosis. IMPRESSION: 1. Partially visible comminuted fractures of the proximal right tibia and fibula. 2. Right femur is intact.  Chronic right knee joint degeneration. Electronically Signed   By: Genevie Ann M.D.   On: 10/30/2021 11:36       The results of significant diagnostics from this hospitalization (including imaging, microbiology, ancillary and laboratory) are listed below for reference.     Microbiology: Recent Results (from the past 240 hour(s))  Blood culture (routine x 2)     Status: None (Preliminary result)   Collection Time: 11/26/21  4:03 PM   Specimen: BLOOD  Result Value Ref Range Status   Specimen Description BLOOD BLOOD LEFT FOREARM  Final   Special Requests   Final    BOTTLES DRAWN AEROBIC AND ANAEROBIC Blood Culture results may not be optimal due to an excessive volume of blood received in culture bottles   Culture   Final    NO GROWTH 2 DAYS Performed at [facility], [address], [place], [place] [address]    Report Status PENDING  Incomplete  Blood culture (routine x 2)     Status: None (Preliminary result)   Collection Time: 11/26/21  4:03 PM  Specimen: BLOOD  Result Value Ref Range Status   Specimen Description BLOOD BLOOD RIGHT ARM  Final   Special Requests   Final    BOTTLES DRAWN AEROBIC AND ANAEROBIC Blood Culture adequate volume   Culture   Final    NO GROWTH 2 DAYS Performed at [facility], [address] [place], [place] [address]    Report Status PENDING  Incomplete  Resp Panel by RT-PCR (Flu A&B, Covid) Nasopharyngeal Swab     Status: None   Collection Time: 11/26/21  5:03 PM   Specimen: Nasopharyngeal Swab; Nasopharyngeal(NP) swabs in vial transport medium  Result Value Ref Range Status   SARS Coronavirus 2 by RT PCR NEGATIVE NEGATIVE Final    Comment: (NOTE) SARS-CoV-2 target nucleic acids are NOT DETECTED.  The SARS-CoV-2 RNA is generally detectable in upper respiratory specimens during the acute phase of infection. The lowest concentration of SARS-CoV-2 viral copies this assay can detect is 138 copies/mL. A negative result does not preclude SARS-Cov-2 infection and should not be used as the sole basis for treatment or other patient management decisions. A negative result may occur with  improper specimen collection/handling, submission of specimen other than nasopharyngeal swab, presence of viral mutation(s) within the areas targeted by this assay, and inadequate number of viral copies(<138 copies/mL). A negative result must be combined with clinical observations, patient history, and epidemiological information. The expected result is Negative.  Fact Sheet for Patients:  EntrepreneurPulse.com.au  Fact Sheet for Healthcare Providers:  IncredibleEmployment.be  This test is no t yet approved or cleared by the [place] FDA and  has been authorized for detection and/or diagnosis of SARS-CoV-2 by FDA under an Emergency Use Authorization (EUA). This EUA will remain  in effect (meaning this test can be used) for the duration of the COVID-19 declaration under Section 564(b)(1) of the Act, 21 U.S.C.section 360bbb-3(b)(1), unless the authorization is terminated  or revoked sooner.       Influenza A by PCR NEGATIVE NEGATIVE Final   Influenza B by PCR NEGATIVE NEGATIVE Final    Comment: (NOTE) The Xpert Xpress SARS-CoV-2/FLU/RSV  plus assay is intended as an aid in the diagnosis of influenza from Nasopharyngeal swab specimens and should not be used as a sole basis for treatment. Nasal washings and aspirates are unacceptable for Xpert Xpress SARS-CoV-2/FLU/RSV testing.  Fact Sheet for Patients: EntrepreneurPulse.com.au  Fact Sheet for Healthcare Providers: IncredibleEmployment.be  This test is not yet approved or cleared by the [place] FDA and has been authorized for detection and/or diagnosis of SARS-CoV-2 by FDA under an Emergency Use Authorization (EUA). This EUA will remain in effect (meaning this test can be used) for the duration of the COVID-19 declaration under Section 564(b)(1) of the Act, 21 U.S.C. section 360bbb-3(b)(1), unless the authorization is terminated or revoked.  Performed at [facility], [address], [place], [place] [address]   Aerobic Culture w Gram Stain (superficial specimen)     Status: None (Preliminary result)   Collection Time: 11/27/21  6:40 PM   Specimen: Leg; Wound  Result Value Ref Range Status   Specimen Description   Final    LEG right Performed at [facility], [address], [place], [place] [address]    Special Requests   Final    NONE Performed at [facility], [place], [place] [address]    Gram Stain   Final    NO SQUAMOUS EPITHELIAL CELLS SEEN FEW WBC SEEN FEW Lonell Grandchild  POSITIVE COCCI    Culture   Final    TOO YOUNG TO READ Performed at [facility], [place] [address], [place], [place] [address]    Report Status PENDING  Incomplete  MRSA Next Gen by PCR, Nasal     Status: None   Collection Time: 11/28/21  7:00 AM   Specimen: Nasal Mucosa; Nasal Swab  Result Value Ref Range Status   MRSA by PCR Next Gen NOT DETECTED NOT DETECTED Final    Comment: (NOTE) The GeneXpert MRSA Assay (FDA approved for NASAL specimens only), is one component of a comprehensive MRSA  colonization surveillance program. It is not intended to diagnose MRSA infection nor to guide or monitor treatment for MRSA infections. Test performance is not FDA approved in patients less than 84 years old. Performed at [facility], [address] [place], [place] [address]      Labs:  CBC: Recent Labs  Lab 11/26/21 1515 11/27/21 0557 11/28/21 0435  WBC 9.3 7.3 6.6  NEUTROABS 7.6  --   --   HGB 8.9* 8.0* 7.7*  HCT 28.8* 25.4* 25.0*  MCV 89.2 89.8 89.6  PLT 354 284 275   BMP &GFR Recent Labs  Lab 11/26/21 1515 11/27/21 0557 11/28/21 0435  NA 130* 132* 132*  K 3.9 3.6 3.5  CL 92* 96* 96*  CO2 33* 33* 32  GLUCOSE 274* 155* 92  BUN 15 16 17   CREATININE 0.75 0.85 0.76  CALCIUM 11.4* 10.9* 11.4*  MG  --   --  1.7  PHOS  --   --  2.8   Estimated Creatinine Clearance: 99.5 mL/min (by C-G formula based on SCr of 0.76 mg/dL). Liver & Pancreas: Recent Labs  Lab 11/27/21 0557 11/28/21 0435  AST 33  --   ALT 35  --   ALKPHOS 146*  --   BILITOT 0.7  --   PROT 6.7  --   ALBUMIN 2.3* 2.1*   No results for input(s): LIPASE, AMYLASE in the last 168 hours. No results for input(s): AMMONIA in the last 168 hours. Diabetic: Recent Labs    11/26/21 1515  HGBA1C 6.2*   Recent Labs  Lab 11/27/21 1106 11/27/21 1530 11/27/21 2118 11/28/21 0913 11/28/21 1223  GLUCAP 233* 217* 220* 115* 107*   Cardiac Enzymes: No results for input(s): CKTOTAL, CKMB, CKMBINDEX, TROPONINI in the last 168 hours. No results for input(s): PROBNP in the last 8760 hours. Coagulation Profile: No results for input(s): INR, PROTIME in the last 168 hours. Thyroid Function Tests: No results for input(s): TSH, T4TOTAL, FREET4, T3FREE, THYROIDAB in the last 72 hours. Lipid Profile: No results for input(s): CHOL, HDL, LDLCALC, TRIG, CHOLHDL, LDLDIRECT in the last 72 hours. Anemia Panel: No results for input(s): VITAMINB12, FOLATE, FERRITIN, TIBC, IRON, RETICCTPCT in the last 72  hours. Urine analysis:    Component Value Date/Time   COLORURINE YELLOW (A) 11/26/2021 2020   APPEARANCEUR HAZY (A) 11/26/2021 2020   APPEARANCEUR Clear 10/22/2014 1539   LABSPEC 1.036 (H) 11/26/2021 2020   LABSPEC 1.010 10/22/2014 1539   PHURINE 6.0 11/26/2021 2020   GLUCOSEU 50 (A) 11/26/2021 2020   GLUCOSEU 150 mg/dL 10/22/2014 1539   HGBUR NEGATIVE 11/26/2021 2020   BILIRUBINUR NEGATIVE 11/26/2021 2020   BILIRUBINUR Negative 10/22/2014 [place] 11/26/2021 2020   PROTEINUR 30 (A) 11/26/2021 2020   UROBILINOGEN 0.2 07/03/2009 1105   NITRITE NEGATIVE 11/26/2021 2020   LEUKOCYTESUR NEGATIVE 11/26/2021 2020   LEUKOCYTESUR Negative 10/22/2014  1539   Sepsis Labs: Invalid input(s): PROCALCITONIN, LACTICIDVEN   Time coordinating discharge: 55 minutes  SIGNED:  Mercy Riding, MD  [facility] 11/28/2021, 1:20 PM

## 2021-11-28 NOTE — Progress Notes (Addendum)
TOC acknowledges consult for hospital transfer to [facility]. TOC cannot complete hospital to hospital transfers. Notified RN and MD. MD already aware of request for transfer and working on it.  Oleh Genin, [place]

## 2021-11-28 NOTE — Progress Notes (Signed)
PROGRESS NOTE  Brittney [name] GGE:[id number] DOB: [birth year]   PCP: Rusty Aus, MD  Patient is from: Home.  Lives alone.  Uses power chair at baseline.  DOA: 11/26/2021 LOS: 1  Chief complaints:  Chief Complaint  Patient presents with   Emesis   Fever     Brief Narrative / Interim history: 70 year old F with PMH of IDDM-2 with gastroparesis, neuropathy, retinopathy on insulin pump, CVA, morbid obesity, OSA on BiPAP, HTN, HLD, GIB, gastric polyp/AVM, anxiety, depression, chronic pain, hypothyroidism, osteoporosis and recent hospitalization at [facility] from 11/4-11/30 for right tibial and fibular fracture for which she had IM implant and discharged home after she refused SNF, came to ED with nausea, vomiting and diarrhea, and admitted for sepsis due to right lower extremity cellulitis.  Patient was febrile to 103.3 F.  Has markedly elevated CRP to 23.  Lactic acid and procalcitonin negative.  She has no leukocytosis.  PA chest/abdomen/pelvis negative for PE but suggestive of liver cirrhosis.  Right lower extremity x-ray suggesting cellulitis and acute nondisplaced right proximal fibula fracture, although this is not new per chart review.  Blood cultures drawn.  C. difficile and GI panel ordered but patient has not had further bowel movements.  Orthopedic surgery consulted.  Patient was started on vancomycin and cefepime, and admitted.  ID consulted for guidance on antibiotics.  Subjective: Seen and examined earlier this morning.  She continues to endorse severe pain in right lower extremity.  Has not had further nausea, vomiting or diarrhea.  Denies abdominal pain.  Denies chest pain or dyspnea.  Objective: Vitals:   11/27/21 2121 11/28/21 0447 11/28/21 0819 11/28/21 1138  BP: (!) 142/42 (!) 146/64 (!) 152/56 (!) 151/62  Pulse: 65 64 65 77  Resp: 20 16 19 19   Temp: 98.9 F (37.2 C) 99 F (37.2 C) 98 F (36.7 C) 98.2 F (36.8 C)  TempSrc: Oral     SpO2: 98% 99% 99% 99%  Weight:       Height:        Intake/Output Summary (Last 24 hours) at 11/28/2021 1202 Last data filed at 11/28/2021 0647 Gross per 24 hour  Intake 953.33 ml  Output 700 ml  Net 253.33 ml   Filed Weights   11/26/21 1518  Weight: (!) 145 kg    Examination:  GENERAL: No apparent distress.  Nontoxic. HEENT: MMM.  Vision and hearing grossly intact.  NECK: Supple.  No apparent JVD.  RESP:  No IWOB.  Fair aeration bilaterally. CVS:  RRR. Heart sounds normal.  ABD/GI/GU: BS+. Abd soft, NTND.  MSK/EXT:  Moves extremities. No apparent deformity.  1-2 edema in BLE SKIN: Stage III RLE wound.  Surgical wound appears to be clean and dry.  No significant erythema or increased warmth to touch. NEURO: Awake and alert. Oriented appropriately.  No apparent focal neuro deficit. PSYCH: Calm. Normal affect.         Procedures:  None  Microbiology summarized: [id number] and influenza PCR nonreactive. Blood cultures NGTD. C. difficile and GIP ordered but no further BM.  Assessment & Plan: Sepsis due to possible right lower extremity cellulitis:POA: Had fever and increased RR on presentation.  Patient with recent right tibial and fibular fracture s/p ORIF at [facility] on 11/5.  She has RLE wound.  No significant erythema or increased warmth to touch.  However, she was febrile to 103.3 on arrival.  Imaging suggestive of cellulitis.  No clinical or radiologic evidence of fluid collection to suggest abscess.  Hemodynamically  stable.  Lactic acid and Pro-Cal within normal.  She has no leukocytosis either.  Blood cultures NGTD.  CRP elevated.  Superficial wound culture with few GPC's. -Continue IV vancomycin and cefepime for now -ID following. -Patient has been refusing dressing change by RN asking for WOCN to do.  Right tibial shaft and proximal fibular fracture -Status post ORIF at [facility] on 11/5. -Orthopedic surgery recommended transfer back to [facility] given complexity -Talked to the orthopedic surgeon on-call at [facility],  Dr. Bridgett Larsson for transfer but he likes to take to our orthopedic surgeon first. I have notified Dr. Marry Guan -Secretary to fax facesheet.   Intractable nausea/vomiting/diarrhea: Resolved.  CT A/P suggested liver cirrhosis but no acute finding.  No further N/V/B/D since arrival.  Abdominal exam benign. -Enteric precautions discontinued by ID.  Controlled IDDM-2 with hyperglycemia, hyperlipidemia, nephropathy, retinopathy and polyneuropathy: A1c 6.4% on 10/19/2021.  On insulin pump at home.  Followed by endocrinology outpatient. Recent Labs  Lab 11/27/21 0944 11/27/21 1106 11/27/21 1530 11/27/21 2118 11/28/21 0913  GLUCAP 269* 233* 217* 220* 115*  -Appreciate diabetic coronary to help with her insulin pump. -May have to switch to SSI-if she remains hyperglycemic -Resumed home gabapentin at reduced dose-increase from 600 to 900 mg nightly.  -Continue statin.  Hypercalcemia: Concern about primary hypothyroidism when she was at [facility].  Vitamin D was within normal.  She is also on vitamin D supplementation.  She was advised to follow-up with endocrinology.  I do not see PTH checked. -Follow PTH level -Resume home Lasix  -Stop vitamin D -Zometa 4 mg x 1.  Iron deficiency anemia: Recent EGD at [facility] showed gastric polyps and nonbleeding AVM that was cauterized.  LDH and haptoglobin were within normal.  She received iron infusions at [facility].  No report of melena or hematochezia.  Hgb was 8.1 on discharge. Recent Labs    02/02/21 1333 05/14/21 1410 07/07/21 1547 07/09/21 0429 07/17/21 1941 10/30/21 1045 11/26/21 1515 11/27/21 0557 11/28/21 0435  HGB 11.7* 12.5 10.7* 10.4* 11.1* 11.3* 8.9* 8.0* 7.7*  -H&H stable -Continue monitoring  Resistant hypertension: SBP in 150s. -Continue Avapro and instead of home Micardis. -Increased hydralazine from 50 mg twice daily to 50 mg 3 times daily. -Continue home Lasix and clonidine patch. -Continue home Coreg.  Anxiety/depression/insomnia:  Stable. -Continue home Celexa, trazodone and Xanax.  Hyponatremia: Looks hypervolemic but difficult to assess fluid status due to body habitus. -Lasix as above -Continue monitoring   OSA/possible OHS on BiPAP -Nightly BiPAP. -Minimize sedating medications.   Chronic pain syndrome: Seems to be on Flexeril, tramadol, gabapentin and oxycodone -Resume gabapentin at reduced dose .  Increase to 900 mg nightly. -Oxycodone and IV Dilaudid as needed based on pain severity -Continue home Linzess for constipation.  Ambulatory dysfunction-patient uses power chair at home.  She was recommended SNF during her stay at [facility] but decided to go home.  -PT/OT eval-May need placement.  At risk for polypharmacy: on significant/high dose of Flexeril, Ambien, trazodone, tramadol, Xanax, oxycodone and gabapentin -Holding Ambien and Flexeril. -Reduced gabapentin  Morbid obesity-difficult situation. Body mass index is 51.6 kg/m.  -Lifestyle changes with focus on dietary changes to lose weight.       DVT prophylaxis:  Place TED hose Start: 11/26/21 2136  Code Status: Full code Family Communication: Patient and/or RN. Available if any question.  Level of care: Med-Surg Status is: Inpatient  The patient will remain inpatient because: Sepsis due to cellulitis need for IV pain medication for pain control, and need  for transfer to outside hospital for further care.      Consultants:  Orthopedic surgery Infectious disease   Sch Meds:  Scheduled Meds:  ALPRAZolam  0.5 mg Oral TID   carvedilol  50 mg Oral BID WC   cloNIDine  0.3 mg Transdermal Weekly   enoxaparin (LOVENOX) injection  0.5 mg/kg Subcutaneous Q24H   escitalopram  10 mg Oral Daily   ferrous sulfate  325 mg Oral Q breakfast   gabapentin  600 mg Oral QHS   hydrALAZINE  50 mg Oral Q8H   insulin pump   Subcutaneous TID WC, HS, 0200   irbesartan  150 mg Oral Daily   levothyroxine  200 mcg Oral Q0600   pantoprazole  40 mg Oral BID    pramipexole  1 mg Oral QHS   pravastatin  20 mg Oral q1800   traZODone  150 mg Oral QHS   Continuous Infusions:  ceFEPime (MAXIPIME) IV 2 g (11/28/21 0719)   promethazine (PHENERGAN) injection (IM or IVPB)     vancomycin 2,000 mg (11/27/21 1541)   zoledronic acid (ZOMETA) IV     PRN Meds:.acetaminophen **OR** acetaminophen, albuterol, budesonide, fluticasone, furosemide, HYDROmorphone (DILAUDID) injection, labetalol, linaclotide, ondansetron **OR** ondansetron (ZOFRAN) IV, oxyCODONE, oxyCODONE, promethazine (PHENERGAN) injection (IM or IVPB)  Antimicrobials: Anti-infectives (From admission, onward)    Start     Dose/Rate Route Frequency Ordered Stop   11/27/21 1400  vancomycin (VANCOREADY) IVPB 2000 mg/400 mL        2,000 mg 200 mL/hr over 120 Minutes Intravenous Every 24 hours 11/26/21 2152     11/27/21 0600  ceFEPIme (MAXIPIME) 2 g in sodium chloride 0.9 % 100 mL IVPB        2 g 200 mL/hr over 30 Minutes Intravenous Every 8 hours 11/26/21 2152     11/27/21 0000  vancomycin (VANCOREADY) IVPB 1500 mg/300 mL        1,500 mg 150 mL/hr over 120 Minutes Intravenous  Once 11/26/21 2152 11/27/21 0410   11/26/21 1645  ceFEPIme (MAXIPIME) 2 g in sodium chloride 0.9 % 100 mL IVPB        2 g 200 mL/hr over 30 Minutes Intravenous  Once 11/26/21 1641 11/26/21 1735   11/26/21 1645  vancomycin (VANCOCIN) IVPB 1000 mg/200 mL premix        1,000 mg 200 mL/hr over 60 Minutes Intravenous  Once 11/26/21 1641 11/26/21 1838        I have personally reviewed the following labs and images: CBC: Recent Labs  Lab 11/26/21 1515 11/27/21 0557 11/28/21 0435  WBC 9.3 7.3 6.6  NEUTROABS 7.6  --   --   HGB 8.9* 8.0* 7.7*  HCT 28.8* 25.4* 25.0*  MCV 89.2 89.8 89.6  PLT 354 284 275   BMP &GFR Recent Labs  Lab 11/26/21 1515 11/27/21 0557 11/28/21 0435  NA 130* 132* 132*  K 3.9 3.6 3.5  CL 92* 96* 96*  CO2 33* 33* 32  GLUCOSE 274* 155* 92  BUN 15 16 17   CREATININE 0.75 0.85 0.76  CALCIUM  11.4* 10.9* 11.4*  MG  --   --  1.7  PHOS  --   --  2.8   Estimated Creatinine Clearance: 99.5 mL/min (by C-G formula based on SCr of 0.76 mg/dL). Liver & Pancreas: Recent Labs  Lab 11/27/21 0557 11/28/21 0435  AST 33  --   ALT 35  --   ALKPHOS 146*  --   BILITOT 0.7  --   PROT  6.7  --   ALBUMIN 2.3* 2.1*   No results for input(s): LIPASE, AMYLASE in the last 168 hours. No results for input(s): AMMONIA in the last 168 hours. Diabetic: Recent Labs    11/26/21 1515  HGBA1C 6.2*   Recent Labs  Lab 11/27/21 0944 11/27/21 1106 11/27/21 1530 11/27/21 2118 11/28/21 0913  GLUCAP 269* 233* 217* 220* 115*   Cardiac Enzymes: No results for input(s): CKTOTAL, CKMB, CKMBINDEX, TROPONINI in the last 168 hours. No results for input(s): PROBNP in the last 8760 hours. Coagulation Profile: No results for input(s): INR, PROTIME in the last 168 hours. Thyroid Function Tests: No results for input(s): TSH, T4TOTAL, FREET4, T3FREE, THYROIDAB in the last 72 hours. Lipid Profile: No results for input(s): CHOL, HDL, LDLCALC, TRIG, CHOLHDL, LDLDIRECT in the last 72 hours. Anemia Panel: No results for input(s): VITAMINB12, FOLATE, FERRITIN, TIBC, IRON, RETICCTPCT in the last 72 hours. Urine analysis:    Component Value Date/Time   COLORURINE YELLOW (A) 11/26/2021 2020   APPEARANCEUR HAZY (A) 11/26/2021 2020   APPEARANCEUR Clear 10/22/2014 1539   LABSPEC 1.036 (H) 11/26/2021 2020   LABSPEC 1.010 10/22/2014 1539   PHURINE 6.0 11/26/2021 2020   GLUCOSEU 50 (A) 11/26/2021 2020   GLUCOSEU 150 mg/dL 10/22/2014 1539   HGBUR NEGATIVE 11/26/2021 2020   BILIRUBINUR NEGATIVE 11/26/2021 2020   BILIRUBINUR Negative 10/22/2014 [place] 11/26/2021 2020   PROTEINUR 30 (A) 11/26/2021 2020   UROBILINOGEN 0.2 07/03/2009 1105   NITRITE NEGATIVE 11/26/2021 2020   LEUKOCYTESUR NEGATIVE 11/26/2021 2020   LEUKOCYTESUR Negative 10/22/2014 1539   Sepsis Labs: Invalid input(s):  PROCALCITONIN, [place]  Microbiology: Recent Results (from the past 240 hour(s))  Blood culture (routine x 2)     Status: None (Preliminary result)   Collection Time: 11/26/21  4:03 PM   Specimen: BLOOD  Result Value Ref Range Status   Specimen Description BLOOD BLOOD LEFT FOREARM  Final   Special Requests   Final    BOTTLES DRAWN AEROBIC AND ANAEROBIC Blood Culture results may not be optimal due to an excessive volume of blood received in culture bottles   Culture   Final    NO GROWTH 2 DAYS Performed at [facility], [address], [place], [place] [address]    Report Status PENDING  Incomplete  Blood culture (routine x 2)     Status: None (Preliminary result)   Collection Time: 11/26/21  4:03 PM   Specimen: BLOOD  Result Value Ref Range Status   Specimen Description BLOOD BLOOD RIGHT ARM  Final   Special Requests   Final    BOTTLES DRAWN AEROBIC AND ANAEROBIC Blood Culture adequate volume   Culture   Final    NO GROWTH 2 DAYS Performed at [facility], [address] [place], [place] [address]    Report Status PENDING  Incomplete  Resp Panel by RT-PCR (Flu A&B, Covid) Nasopharyngeal Swab     Status: None   Collection Time: 11/26/21  5:03 PM   Specimen: Nasopharyngeal Swab; Nasopharyngeal(NP) swabs in vial transport medium  Result Value Ref Range Status   SARS Coronavirus 2 by RT PCR NEGATIVE NEGATIVE Final    Comment: (NOTE) SARS-CoV-2 target nucleic acids are NOT DETECTED.  The SARS-CoV-2 RNA is generally detectable in upper respiratory specimens during the acute phase of infection. The lowest concentration of SARS-CoV-2 viral copies this assay can detect is 138 copies/mL. A negative result does not preclude SARS-Cov-2 infection and should not be used  as the sole basis for treatment or other patient management decisions. A negative result may occur with  improper specimen collection/handling, submission of specimen other than nasopharyngeal  swab, presence of viral mutation(s) within the areas targeted by this assay, and inadequate number of viral copies(<138 copies/mL). A negative result must be combined with clinical observations, patient history, and epidemiological information. The expected result is Negative.  Fact Sheet for Patients:  EntrepreneurPulse.com.au  Fact Sheet for Healthcare Providers:  IncredibleEmployment.be  This test is no t yet approved or cleared by the [place] FDA and  has been authorized for detection and/or diagnosis of SARS-CoV-2 by FDA under an Emergency Use Authorization (EUA). This EUA will remain  in effect (meaning this test can be used) for the duration of the COVID-19 declaration under Section 564(b)(1) of the Act, 21 U.S.C.section 360bbb-3(b)(1), unless the authorization is terminated  or revoked sooner.       Influenza A by PCR NEGATIVE NEGATIVE Final   Influenza B by PCR NEGATIVE NEGATIVE Final    Comment: (NOTE) The Xpert Xpress SARS-CoV-2/FLU/RSV plus assay is intended as an aid in the diagnosis of influenza from Nasopharyngeal swab specimens and should not be used as a sole basis for treatment. Nasal washings and aspirates are unacceptable for Xpert Xpress SARS-CoV-2/FLU/RSV testing.  Fact Sheet for Patients: EntrepreneurPulse.com.au  Fact Sheet for Healthcare Providers: IncredibleEmployment.be  This test is not yet approved or cleared by the [place] FDA and has been authorized for detection and/or diagnosis of SARS-CoV-2 by FDA under an Emergency Use Authorization (EUA). This EUA will remain in effect (meaning this test can be used) for the duration of the COVID-19 declaration under Section 564(b)(1) of the Act, 21 U.S.C. section 360bbb-3(b)(1), unless the authorization is terminated or revoked.  Performed at [facility], [address], [place], [place] [address]   Aerobic  Culture w Gram Stain (superficial specimen)     Status: None (Preliminary result)   Collection Time: 11/27/21  6:40 PM   Specimen: Leg; Wound  Result Value Ref Range Status   Specimen Description   Final    LEG right Performed at [facility], [address], [place], [place] [address]    Special Requests   Final    NONE Performed at [facility], [place], [place] [address]    Gram Stain   Final    NO SQUAMOUS EPITHELIAL CELLS SEEN FEW WBC SEEN FEW GRAM POSITIVE COCCI    Culture   Final    TOO YOUNG TO READ Performed at [facility], [place] [address] [place], [place] [address]    Report Status PENDING  Incomplete  MRSA Next Gen by PCR, Nasal     Status: None   Collection Time: 11/28/21  7:00 AM   Specimen: Nasal Mucosa; Nasal Swab  Result Value Ref Range Status   MRSA by PCR Next Gen NOT DETECTED NOT DETECTED Final    Comment: (NOTE) The GeneXpert MRSA Assay (FDA approved for NASAL specimens only), is one component of a comprehensive MRSA colonization surveillance program. It is not intended to diagnose MRSA infection nor to guide or monitor treatment for MRSA infections. Test performance is not FDA approved in patients less than 61 years old. Performed at [facility], [address], [place], [place] [address]     Radiology Studies: No results found.    Tyshika Baldridge T. [place]  If 7PM-7AM, please contact night-coverage [email or web address] 11/28/2021, 12:02 PM

## 2021-11-28 NOTE — Progress Notes (Signed)
OT Cancellation Note  Patient Details Name: Brittney [name] MRN: [id number] DOB: 1951-11-27   Cancelled Treatment:    Reason Eval/Treat Not Completed: Other (comment) Pt continues to decline to participate in skilled OT citing pain. In addition, it appears arrangements are being made for pt transfer to [facility] where physicians are more familiar with her case considering her recent extended hospitalization there. Should pt stay, will f/u once more to encourage participation. Thank you.  Gerrianne Scale, [place], OTR/L ascom [phone] 11/28/21, 1:49 PM

## 2021-11-28 NOTE — Progress Notes (Signed)
ORTHOPAEDICS PROGRESS NOTE  PATIENT NAME: Brittney [name] DOB: [birth year]  MRN: [id number]  Hospital day # 2: Right lower extremity cellulitis status post ORIF of grade 3 open tibia fracture (performed at [facility])  Subjective: Patient still reports right lower extremity pain although improved with modification of medications last night.  She appears more comfortable this morning.  Objective: Vital signs in last 24 hours: Temp:  [98 F (36.7 C)-99 F (37.2 C)] 98.2 F (36.8 C) (12/03 1138) Pulse Rate:  [64-77] 77 (12/03 1138) Resp:  [16-20] 19 (12/03 1138) BP: (122-152)/(42-64) 151/62 (12/03 1138) SpO2:  [98 %-99 %] 99 % (12/03 1138)  Intake/Output from previous day: 12/02 0701 - 12/03 0700 In: 953.3 [P.O.:350; IV Piggyback:603.3] Out: 700 [Urine:700]  Recent Labs    11/26/21 1515 11/27/21 0557 11/28/21 0435  WBC 9.3 7.3 6.6  HGB 8.9* 8.0* 7.7*  HCT 28.8* 25.4* 25.0*  PLT 354 284 275  K 3.9 3.6 3.5  CL 92* 96* 96*  CO2 33* 33* 32  BUN 15 16 17   CREATININE 0.75 0.85 0.76  GLUCOSE 274* 155* 92  CALCIUM 11.4* 10.9* 11.4*    EXAM General: Obese female seen in no apparent discomfort. Right lower extremity: Dressings intact to right lower extremity.  Calf is soft.  Mild hyperemia. Neurologic: Awake, alert, and oriented.  Right foot drop is noted with decreased sensation to the dorsal and plantar surface of the right foot.  (Apparently present post surgical intervention)  Assessment: Right lower extremity cellulitis status post open reduction and internal fixation of a right tibia fracture  Secondary diagnoses: Anemia Asthma History of melanoma Chronic kidney disease, stage II Collagen vascular disease Coronary artery disease Diabetes Gastroparesis Hepatic cirrhosis Nephrolithiasis Hyperlipidemia Hypertension Hypothyroidism Irritable bowel syndrome Morbid obesity (BMI 51.6) Peripheral vascular disease CVA  Plan: Continue antibiotics as per Infectious  disease. Both the patient (and ter daughter) request transfer to [facility] for continuation of treatment.  Given the complexity of the injury and the postoperative complication, high certainly concur that transfer to a tertiary care center is appropriate.  I have notified Care Management and the hospitalist service.  Mady Oubre P. Holley Bouche M.D.

## 2021-11-30 LAB — PTH, INTACT AND CALCIUM
Calcium, Total (PTH): 11.6 mg/dL — ABNORMAL HIGH (ref 8.7–10.3)
PTH: 37 pg/mL (ref 15–65)

## 2021-12-01 LAB — AEROBIC CULTURE W GRAM STAIN (SUPERFICIAL SPECIMEN): Gram Stain: NONE SEEN

## 2021-12-01 LAB — CULTURE, BLOOD (ROUTINE X 2)
Culture: NO GROWTH
Culture: NO GROWTH
Special Requests: ADEQUATE

## 2021-12-23 ENCOUNTER — Encounter: Payer: Self-pay | Admitting: Oncology

## 2021-12-23 ENCOUNTER — Telehealth: Payer: Self-pay | Admitting: Student

## 2021-12-23 NOTE — Telephone Encounter (Signed)
Spoke with patient about the Palliative referral/services and all questions were answered and she was in agreement with scheduling visit.  I have scheduled an In-home Consult for 12/28/21 @ 2 PM

## 2021-12-28 ENCOUNTER — Encounter: Payer: Self-pay | Admitting: Oncology

## 2021-12-28 ENCOUNTER — Other Ambulatory Visit: Payer: Self-pay

## 2021-12-28 ENCOUNTER — Other Ambulatory Visit: Payer: Medicare Other | Admitting: Student

## 2021-12-28 DIAGNOSIS — R059 Cough, unspecified: Secondary | ICD-10-CM

## 2021-12-28 DIAGNOSIS — R0602 Shortness of breath: Secondary | ICD-10-CM

## 2021-12-28 DIAGNOSIS — Z515 Encounter for palliative care: Secondary | ICD-10-CM

## 2021-12-28 DIAGNOSIS — R531 Weakness: Secondary | ICD-10-CM

## 2021-12-28 DIAGNOSIS — S31801A Laceration without foreign body of unspecified buttock, initial encounter: Secondary | ICD-10-CM

## 2021-12-28 DIAGNOSIS — G894 Chronic pain syndrome: Secondary | ICD-10-CM

## 2021-12-28 NOTE — Progress Notes (Signed)
[facility] Consult Note Telephone: [phone]  Fax: [phone]   Date of encounter: 12/28/21 2:09 PM PATIENT NAME: Brittney [name] [address] [place] [address]   [id number] (home)  DOB: [birth year] MRN: [id number] PRIMARY CARE PROVIDER:    Rusty Aus, MD,  [place] [address] [phone]  REFERRING PROVIDER:   Rusty Aus, MD [address],  [place] [address] [phone]  RESPONSIBLE PARTY:    Contact Information     Name Relation Home Work Mobile   Zannie Kehr Daughter [phone]          I met face to face with patient and family in the home. Palliative Care was asked to follow this patient by consultation request of  Rusty Aus, MD to address advance care planning and complex medical decision making. This is the initial visit.                                     ASSESSMENT AND PLAN / RECOMMENDATIONS:   Advance Care Planning/Goals of Care: Goals include to maximize quality of life and symptom management. Patient/health care surrogate gave his/her permission to discuss.Our advance care planning conversation included a discussion about:    The value and importance of advance care planning  Experiences with loved ones who have been seriously ill or have died  Exploration of personal, cultural or spiritual beliefs that might influence medical decisions  Exploration of goals of care in the event of a sudden injury or illness  No HCPOA in place; would like assistance with this.  Reviewed MOST form today. She does express that she would like to have CPR attempted.  CODE STATUS: Full Code  Education provided on Palliative medicine vs. Hospice services. We discussed code status; she would like to have CPR attempted. We discussed what quality of life means to her. She would like to limit  hospitalizations if possible. Would like to maintain in the home, continue PT, would like to be able to get back to w/c level.    I spent 20 minutes providing this consultation. More than 50% of the time in this consultation was spent in counseling and care coordination. ---------------------------------------------------------------------------------------------------------- Symptom Management/Plan:  Shortness of breath, cough-due to CHF, respiratory infection. Continue oxygen at 2-2.5 lpm. Recommend using budesonide neb each morning and Duoneb at least daily. Continue azithromycin and Augmentin as directed for respiratory infection. CPAP at night; will have palliative nurse reach out to vendor regarding water coming through mask, needs machine serviced.   Generalized weakness-secondary to CHF, chronic pain, tibial fibular fracture. Continue PT as directed.   Chronic pain-she is followed by Dr. Holley Raring at Pain management clinic. She is to see if she can do a virtual visit for upcoming visit next month. Continue oxycodone acetaminophen 10/325 mg BID as directed; gabapentin, ibuprofen 800 mg, Cyclobenzaprine 59m 2 tabs QHS as directed. She is to stop norco.   Skin tear to buttocks- superficial. Apply zinc cream BID; script sent to pharmacy. Discussed offloading as able to tolerate.   Will refer to palliative SW with assistance for HCPOA, living will, assistance with utilities.    Follow up Palliative Care Visit: Palliative care will continue to follow for complex medical decision making, advance care planning, and clarification of goals. Return in 4 weeks or prn.  This visit was coded based on medical decision making (MDM).  PPS: 30%  HOSPICE ELIGIBILITY/DIAGNOSIS: TBD  Chief Complaint: Palliative Medicine initial consult.   HISTORY OF PRESENT ILLNESS:  Brittney [name] is a 71 y.o. 71 year old female  with type 2 diabetes-on insulin pump, astroparesis due to diabetes retinopathy,  neuropathy, peripheral vascular disease, venous stasis of bilateral extremities, essential hypertension, congestive heart failure, OSA on CPAP, CKD, hepatic cirrhosis, chronic lower back pain, chronic pain syndrome, OA, L3-L4 severe lumbar spinal stenosis, right tibial fibular fracture 10/2021, fibromyalgia, failed back surgical syndrome L4-L5 fusion, lung nodules, obesity, cellulitis, anxiety, depression, hypothyroidism. Most recent hospitalization 12/1-12/02/2021 due to cellulitis of RLE.   Resides at home. She has good support from her daughter; Touched by an [place] Monday-Friday 9-12. She is currently receiving PT services and SN services due to RLE wound. Left foot has drop foot; right leg with decreased sensation. Patient states she was previously up  to power w/c about a year ago. She has been non-ambulatory since 2017. She is currently receiving Augmentin and azithromycin for cough/respiratory infection. She is wearing oxygen at 2.5 lpm. Endorses shortness of breath at rest/exertion. Uses nebulizer once a week. Since hospitalization, she has been wearing oxygen throughout the day. She wears CPAP at night, but reports water coming through her mask. Has had insulin pump about 3 years. Does report some elevated blood sugars since starting antibiotics on 12/28. Highest 289 mg/dL, low 55. Usually runs around 18m/dL. Increased confusion since surgery in November, being home. She reports back pain, RLE pain, generalized pain; pain is a 7/10, pain is manageable when a 6/10. Will see pain management next month. She has a spinal cord stimulator. Moving bowels regularly. Nausea x past 3-4 years. Daughter states that SS was paying some of her utilities; she is needing assist with her electric bill. She is awaiting response on a program she applied for.   History obtained from review of EMR, discussion with primary team, and interview with family, facility staff/caregiver and/or Ms. Jergens.  I reviewed available  labs, medications, imaging, studies and related documents from the EMR.  Records reviewed and summarized above.   ROS  General: NAD EYES: denies vision changes ENMT: denies dysphagia Cardiovascular: denies chest pain, SOB with exertion Pulmonary: + cough, Abdomen: endorses good appetite, denies constipation, endorses incontinence of bowel GU: denies dysuria, endorses incontinence of urine MSK: weakness,   Skin: wound to LE, buttocks Neurological: denies pain, denies insomnia Psych: Endorses positive mood Heme/lymph/immuno: denies bruises, abnormal bleeding  Physical Exam: Pulse 78, resp 20, sats 98% on 2.5 lpm Constitutional: NAD General: frail appearing, obese  EYES: anicteric sclera, lids intact, no discharge  ENMT: intact hearing, oral mucous membranes moist, dentition intact CV: S1S2, RRR, no LE edema Pulmonary: rhonchi left lobes, right slightly diminished, no increased work of breathing, NP cough Abdomen: normo-active BS + 4 quadrants, soft and non tender, no ascites GU: deferred MSK: non-ambulatory Skin: warm and dry, wounds to RLE, dressing CDI, superficial shearing to buttocks Neuro: generalized weakness, A & O x 3, forgetful Psych: non-anxious affect, pleasant Hem/lymph/immuno: no widespread bruising CURRENT PROBLEM LIST:  Patient Active Problem List   Diagnosis Date Noted   Gastroenteritis 11/27/2021   Sepsis due to cellulitis ([facility] 11/27/2021   Cellulitis 11/26/2021   Polypharmacy 11/26/2021   Cellulitis of right leg 07/09/2021   Left leg cellulitis 07/08/2021   CHF (congestive heart failure) ([place] 07/07/2021   Chronic kidney disease, stage 3a ([facility] 07/07/2021   Left  fibular fracture 07/07/2021   Bilateral lower leg cellulitis 08/18/2020   Rhabdomyolysis 08/18/2020   Fall at home, initial encounter 08/18/2020   Fall 08/18/2020   Primary osteoarthritis of left knee 11/26/2019   Sepsis ([place]) 07/30/2019   Type 2 diabetes mellitus with diabetic  polyneuropathy, with long-term current use of insulin ([place]) 07/03/2019   OSA on CPAP 01/24/2019   Lymphedema 09/03/2018   Venous stasis of both lower extremities 09/03/2018   Status post lumbar spine surgery for decompression of spinal cord 03/30/2018   Myofascial pain 03/30/2018   Spinal stenosis of thoracic region 03/30/2018   Back pain 11/02/2017   Iron deficiency anemia 05/10/2017   Benign essential hypertension 03/10/2017   Long term prescription benzodiazepine use 01/18/2017   Vitamin D insufficiency 01/18/2017   Neurogenic pain 01/17/2017   L3-4 severe lumbar facet hypertrophy and spinal stenosis 01/10/2017   Diabetes with retinopathy ([place]) 12/07/2016   Diabetic nephropathy ([place]) 12/07/2016   Gastroparesis due to DM ([place]) 12/07/2016   Long term current use of opiate analgesic 12/07/2016   Long term prescription opiate use 12/07/2016   Opiate use 12/07/2016   Chronic pain syndrome 12/07/2016   Chronic low back pain (Location of Primary Source of Pain) (Bilateral) (R>L) 12/07/2016   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/07/2016   Chronic lower extremity pain (Location of Secondary source of pain) (Bilateral) (R>L) 12/07/2016   Chronic knee pain (Location of Tertiary source of pain) (Right) 12/07/2016   Osteoarthritis of knee (Right) 12/07/2016   Grade 1 Anterolisthesis of L3 over L4 and L4 over L5 12/07/2016   Osteoarthritis of sacroiliac joint (Right) 12/07/2016   Generalized osteoarthritis of multiple sites 10/22/2016   Degenerative spondylolisthesis 09/08/2016   L3-4 severe lumbar spinal stenosis (12/31/2016 MRI) 09/08/2016   Lung nodule, multiple 08/31/2016   Hepatic cirrhosis ([place]) 06/20/2016   Fibromyalgia 04/15/2016   Seronegative arthritis 04/15/2016   Diabetes mellitus with peripheral vascular disease ([place]) 03/16/2016   Type 2 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, with long-term current use of  insulin ([place]) 03/16/2016   Morbid obesity with BMI of 50.0-59.9, adult ([place]) 03/16/2016   Type 2 diabetes mellitus with diabetic nephropathy, with long-term current use of insulin ([place]) 03/16/2016   Microalbuminuria 02/20/2016   Essential hypertension 01/27/2016   DM (diabetes mellitus) type II uncontrolled, periph vascular disorder 04/26/2014   Hyperlipemia 04/26/2014   OSA (obstructive sleep apnea) 04/26/2014   Abnormal tumor markers 12/11/2012   Chronic kidney disease, stage II (mild) 12/11/2012   Unilateral small kidney 12/11/2012   PAST MEDICAL HISTORY:  Active Ambulatory Problems    Diagnosis Date Noted   Abnormal tumor markers 12/11/2012   Chronic kidney disease, stage II (mild) 12/11/2012   Degenerative spondylolisthesis 09/08/2016   Diabetes mellitus with peripheral vascular disease ([place]) 03/16/2016   Diabetes with retinopathy ([place]) 12/07/2016   Diabetic nephropathy ([place]) 12/07/2016   DM (diabetes mellitus) type II uncontrolled, periph vascular disorder 04/26/2014   Fibromyalgia 04/15/2016   Gastroparesis due to DM ([place]) 12/07/2016   Generalized osteoarthritis of multiple sites 10/22/2016   Hepatic cirrhosis ([place]) 06/20/2016   Hyperlipemia 04/26/2014   Lung nodule, multiple 08/31/2016   Microalbuminuria 02/20/2016   OSA (obstructive sleep apnea) 04/26/2014   Seronegative arthritis 04/15/2016   Essential hypertension 01/27/2016   L3-4 severe lumbar spinal stenosis (12/31/2016 MRI) 09/08/2016   Type 2 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, with long-term current use of  insulin ([place]) 03/16/2016   Unilateral small kidney 12/11/2012   Long term current use of opiate analgesic 12/07/2016   Long term prescription opiate use 12/07/2016   Opiate use 12/07/2016   Chronic pain syndrome 12/07/2016   Morbid obesity with BMI of 50.0-59.9, adult ([place]) 03/16/2016   Type 2 diabetes mellitus with diabetic nephropathy, with  long-term current use of insulin ([place]) 03/16/2016   Chronic low back pain (Location of Primary Source of Pain) (Bilateral) (R>L) 12/07/2016   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/07/2016   Chronic lower extremity pain (Location of Secondary source of pain) (Bilateral) (R>L) 12/07/2016   Chronic knee pain (Location of Tertiary source of pain) (Right) 12/07/2016   Osteoarthritis of knee (Right) 12/07/2016   Grade 1 Anterolisthesis of L3 over L4 and L4 over L5 12/07/2016   Osteoarthritis of sacroiliac joint (Right) 12/07/2016   L3-4 severe lumbar facet hypertrophy and spinal stenosis 01/10/2017   Neurogenic pain 01/17/2017   Long term prescription benzodiazepine use 01/18/2017   Vitamin D insufficiency 01/18/2017   Iron deficiency anemia 05/10/2017   Back pain 11/02/2017   Status post lumbar spine surgery for decompression of spinal cord 03/30/2018   Myofascial pain 03/30/2018   Spinal stenosis of thoracic region 03/30/2018   Lymphedema 09/03/2018   Venous stasis of both lower extremities 09/03/2018   Sepsis ([place]) 07/30/2019   Primary osteoarthritis of left knee 11/26/2019   Bilateral lower leg cellulitis 08/18/2020   Rhabdomyolysis 08/18/2020   Type 2 diabetes mellitus with diabetic polyneuropathy, with long-term current use of insulin ([place]) 07/03/2019   Benign essential hypertension 03/10/2017   OSA on CPAP 01/24/2019   Fall at home, initial encounter 08/18/2020   Fall 08/18/2020   CHF (congestive heart failure) ([place]) 07/07/2021   Chronic kidney disease, stage 3a ([place]) 07/07/2021   Left fibular fracture 07/07/2021   Left leg cellulitis 07/08/2021   Cellulitis of right leg 07/09/2021   Cellulitis 11/26/2021   Polypharmacy 11/26/2021   Gastroenteritis 11/27/2021   Sepsis due to cellulitis ([place]) 11/27/2021   Resolved Ambulatory Problems    Diagnosis Date Noted   Abdominal pain, left upper quadrant 12/11/2012   Kidney stone 12/11/2012   Flushing 12/11/2012   Gross  hematuria 12/11/2012   Nausea without vomiting 12/11/2012   Nephrolithiasis 37/29/0211   Renal colic 15/52/0802   Sciatica 12/11/2012   Past Medical History:  Diagnosis Date   Anemia    Arthritis    Asthma    Broken leg 2014   Cancer ([place]) 2007   Chronic kidney disease, stage 2 (mild)    Chronic lower back pain    Collagen vascular disease ([place])    Complication of anesthesia    Coronary artery disease    Diabetes mellitus without complication ([place])    Diabetic nephropathy associated with secondary diabetes mellitus ([place])    Gastroparesis    History of kidney stones    Hyperlipidemia    Hypertension    Hypothyroidism    Hypothyroidism    IBS (irritable bowel syndrome)    Lower extremity edema    Morbid obesity with BMI of 40.0-44.9, adult ([place])    Motion sickness    MRSA (methicillin resistant staph aureus) culture positive 2016   Myocardial infarction ([place]) 09/2013   Nonproliferative retinopathy due to secondary diabetes (H[facility])    Numbness and tingling of right leg    Peripheral vascular disease ([place])    Renal insufficiency    Skin cancer 08/2018   Sleep apnea  Spinal cord stimulator status    Stroke [facility]) 07/2020   Unilateral small kidney without contralateral hypertrophy    Wears dentures    Wheelchair dependent    SOCIAL HX:  Social History   Tobacco Use   Smoking status: Never   Smokeless tobacco: Never  Substance Use Topics   Alcohol use: No   FAMILY HX:  Family History  Problem Relation Age of Onset   Heart disease Mother    Cancer Mother    Asthma Mother    Diabetes Father    Kidney disease Father    Hypertension Father    Breast cancer Neg Hx       ALLERGIES:  Allergies  Allergen Reactions   Shellfish Allergy Nausea And Vomiting, Diarrhea and Nausea Only    Non stop sickness once ingests shellfish. Betadine is okay   Cephalexin Other (See Comments)    Doesn't digest it well   Amlodipine Cough   Codeine Nausea And Vomiting and Nausea  Only    states she can take codeine cough syrup without problems states she can take codeine cough syrup without problems   Imdur [Isosorbide Dinitrate] Other (See Comments) and Cough    Headache   Lyrica [Pregabalin] Swelling and Other (See Comments)    "FLU-LIKE" SYMPTOMS   Monosodium Glutamate Other (See Comments) and Cough    Headache   Tape Other (See Comments) and Nausea And Vomiting    can use paper tape Redness     PERTINENT MEDICATIONS:  Outpatient Encounter Medications as of 12/28/2021  Medication Sig   acetaminophen (TYLENOL) 500 MG tablet Take 2 tablets (1,000 mg total) by mouth every 6 (six) hours as needed for mild pain (or Fever >/= 101).   acetaminophen (TYLENOL) 650 MG suppository Place 1 suppository (650 mg total) rectally every 6 (six) hours as needed for mild pain (or Fever >/= 101).   albuterol (PROVENTIL) (2.5 MG/3ML) 0.083% nebulizer solution Take 3 mLs (2.5 mg total) by nebulization 3 (three) times daily as needed for wheezing or shortness of breath.   ALPRAZolam (XANAX) 0.5 MG tablet Take 0.5 mg by mouth 3 (three) times daily.   budesonide (PULMICORT) 0.5 MG/2ML nebulizer solution Take 2 mLs by nebulization daily as needed (Chronic bronchitis).   carvedilol (COREG) 25 MG tablet Take 50 mg by mouth 2 (two) times daily with a meal.   ceFEPIme 2 g in sodium chloride 0.9 % 100 mL Inject 2 g into the vein every 8 (eight) hours.   cloNIDine (CATAPRES - DOSED IN MG/24 HR) 0.3 mg/24hr patch Place 1 patch (0.3 mg total) onto the skin once a week.   enoxaparin (LOVENOX) 80 MG/0.8ML injection Inject 0.725 mLs (72.5 mg total) into the skin daily.   escitalopram (LEXAPRO) 10 MG tablet Take 10 mg by mouth daily.    ferrous sulfate 325 (65 FE) MG tablet Take 325 mg by mouth daily with breakfast.   fluticasone (FLONASE) 50 MCG/ACT nasal spray Place 2 sprays into both nostrils daily as needed for allergies.   furosemide (LASIX) 40 MG tablet Take 40 mg by mouth daily as needed for  fluid or edema.   gabapentin (NEURONTIN) 600 MG tablet Take 2 tablets (1,200 mg total) by mouth at bedtime.   hydrALAZINE (APRESOLINE) 50 MG tablet Take 50 mg by mouth 2 (two) times daily.   hydrALAZINE (APRESOLINE) 50 MG tablet Take 1 tablet (50 mg total) by mouth every 8 (eight) hours.   HYDROmorphone (DILAUDID) 1 MG/ML injection Inject 0.5 mLs (  0.5 mg total) into the vein every 4 (four) hours as needed (Breakthrough pain).   Insulin Human (INSULIN PUMP) SOLN Inject into the skin continuous. Humulin R 100   ipratropium-albuterol (DUONEB) 0.5-2.5 (3) MG/3ML SOLN Take 3 mLs by nebulization every 6 (six) hours as needed (bronchitis). (Patient not taking: Reported on 11/26/2021)   labetalol (NORMODYNE) 5 MG/ML injection Inject 1 mL (5 mg total) into the vein every 3 (three) hours as needed (sbp > 180).   levothyroxine (SYNTHROID) 200 MCG tablet Take 1 tablet (200 mcg total) by mouth daily at 6 (six) AM.   linaclotide (LINZESS) 145 MCG CAPS capsule Take 1 capsule (145 mcg total) by mouth daily as needed (Constipation).   metolazone (ZAROXOLYN) 5 MG tablet Take 5 mg by mouth 2 (two) times a week.   ondansetron (ZOFRAN) 4 MG tablet Take 1 tablet (4 mg total) by mouth every 6 (six) hours as needed for nausea.   ondansetron (ZOFRAN) 4 MG/2ML SOLN injection Inject 2 mLs (4 mg total) into the vein every 6 (six) hours as needed for nausea.   oxyCODONE (OXY IR/ROXICODONE) 5 MG immediate release tablet Take 1 tablet (5 mg total) by mouth every 4 (four) hours as needed for moderate pain.   oxyCODONE 10 MG TABS Take 1 tablet (10 mg total) by mouth every 4 (four) hours as needed for severe pain.   pantoprazole (PROTONIX) 40 MG tablet Take 40 mg by mouth 2 (two) times daily.    pramipexole (MIRAPEX) 0.5 MG tablet Take 1 mg by mouth at bedtime.   pravastatin (PRAVACHOL) 20 MG tablet Take 20 mg by mouth daily.   sodium chloride 0.9 % SOLN 50 mL with promethazine 25 MG/ML SOLN 12.5 mg Inject 12.5 mg into the vein  every 6 (six) hours as needed.   telmisartan (MICARDIS) 40 MG tablet Take 40 mg by mouth daily.   traZODone (DESYREL) 150 MG tablet Take 150 mg by mouth at bedtime.    vancomycin HCl (VANCOREADY) 2000 MG/400ML SOLN Inject 400 mLs (2,000 mg total) into the vein daily.   No facility-administered encounter medications on file as of 12/28/2021.   Thank you for the opportunity to participate in the care of Ms. Aye.  The palliative care team will continue to follow. Please call our office at [phone] if we can be of additional assistance.   Ezekiel Slocumb, NP   COVID-19 PATIENT SCREENING TOOL Asked and negative response unless otherwise noted:  Have you had symptoms of covid, tested positive or been in contact with someone with symptoms/positive test in the past 5-10 days? No

## 2021-12-30 ENCOUNTER — Telehealth: Payer: Self-pay

## 2021-12-30 ENCOUNTER — Other Ambulatory Visit: Payer: Self-pay | Admitting: *Deleted

## 2021-12-30 DIAGNOSIS — D509 Iron deficiency anemia, unspecified: Secondary | ICD-10-CM

## 2021-12-30 NOTE — Telephone Encounter (Signed)
[facility] notified that patient needs nebulizer masks and tubing replaced. Also, updated [facility] that patient's CPAP needs to be serviced.

## 2022-01-01 ENCOUNTER — Telehealth: Payer: Self-pay

## 2022-01-01 NOTE — Telephone Encounter (Signed)
PC SW outreached patient, per [facility] NP - L. Dunellen, SW referral request to assess and discuss needs.  Patient shared that she has concerns about her electricity bill. Patient stated that she has been receiving assistance from DSS to help pay her electricity bill for years. However, this month her electricity has been cut off twice and she has had to employ the assistance of her daughter to pay her bill.  Patient shared that she has called and left multiple messages for DSS with her information and request for a call back with no luck.  SW inquired if patient has completed a renewal application for the [employer] program, patient stated he has not and has not had to do a renewal application as she was receiving assistance from the emergency crisis program and not ILEAP. The emergency crisis program is currently out of funds, per the DSS automated recording but are accepting applications for the ILEAP program.   Patient shared that her daughter is currently helping her navigate this issue and assistance financially. SW will mail patient an ILEAP application in case she chooses to apply along with other local agencies that may be able to assist financially.

## 2022-01-04 ENCOUNTER — Inpatient Hospital Stay: Payer: Commercial Managed Care - HMO

## 2022-01-04 ENCOUNTER — Inpatient Hospital Stay: Payer: Commercial Managed Care - HMO | Attending: Oncology | Admitting: Nurse Practitioner

## 2022-01-07 ENCOUNTER — Encounter: Payer: Medicare Other | Admitting: Student in an Organized Health Care Education/Training Program

## 2022-01-08 ENCOUNTER — Other Ambulatory Visit: Payer: Medicare Other | Admitting: Student

## 2022-01-08 ENCOUNTER — Other Ambulatory Visit: Payer: Self-pay

## 2022-01-08 ENCOUNTER — Encounter: Payer: Self-pay | Admitting: Oncology

## 2022-01-08 DIAGNOSIS — G894 Chronic pain syndrome: Secondary | ICD-10-CM

## 2022-01-08 DIAGNOSIS — L089 Local infection of the skin and subcutaneous tissue, unspecified: Secondary | ICD-10-CM

## 2022-01-08 DIAGNOSIS — L89890 Pressure ulcer of other site, unstageable: Secondary | ICD-10-CM

## 2022-01-08 DIAGNOSIS — S81812A Laceration without foreign body, left lower leg, initial encounter: Secondary | ICD-10-CM

## 2022-01-08 DIAGNOSIS — Z515 Encounter for palliative care: Secondary | ICD-10-CM

## 2022-01-08 DIAGNOSIS — I5032 Chronic diastolic (congestive) heart failure: Secondary | ICD-10-CM

## 2022-01-08 NOTE — Progress Notes (Signed)
Brittney [name] Consult Note Telephone: [phone]  Fax: [phone]    Date of encounter: 01/08/22 10:55 AM PATIENT NAME: Brittney [name] [address] [place] [address]   [id number] (home)  DOB: 11/21/1951 MRN: [id number] PRIMARY CARE PROVIDER:    Rusty Aus, MD,  [place] [address] [phone]  REFERRING PROVIDER:   Rusty Aus, MD [place],  [place] [address] [phone]  RESPONSIBLE PARTY:    Contact Information     Name Relation Home Work Mobile   Zannie Kehr Daughter [phone]          I met face to face with patient and family in the home. Palliative Care was asked to follow this patient by consultation request of  Rusty Aus, MD to address advance care planning and complex medical decision making. This is a follow up visit.                                   ASSESSMENT AND PLAN / RECOMMENDATIONS:   Advance Care Planning/Goals of Care: Goals include to maximize quality of life and symptom management. Patient/health care surrogate gave his/her permission to discuss. Our advance care planning conversation included a discussion about:    The value and importance of advance care planning  Experiences with loved ones who have been seriously ill or have died  Exploration of personal, cultural or spiritual beliefs that might influence medical decisions  Exploration of goals of care in the event of a sudden injury or illness  MOST form completed today; uploaded to [facility].  CODE STATUS: Full Code   I spent 20 minutes providing this consultation. More than 50% of the time in this consultation was spent in counseling and care coordination.  ----------------------------------------------------------------------------  Symptom Management/Plan:  Skin infection- patient with erythema, warmth,  swelling and tenderness to left great toe. Will start amoxicillin-clavulanate 500-125 mg TID x 7 days. She is encouraged to go to ED if symptoms worsen; she verbalizes understanding.   Unstageable wound-patient with new unstageable wound to tip of left great toe. Start applying skin prep BID, keep area open to area, no blankets or sheets covering to help prevent pressure.   Skin tears x 2-skin tear to LLE sustained from dog scratching. No signs of infection, bleeding has stopped. Continue cleaning with normal saline, apply Vaseline gauze, cover with abd pain. Change dressing every 2 days and PRN.  Heart failure-patient with shortness of breath; she is encouraged to wear her oxygen as directed; she was on room air upon arrival, sats 85%, up to 94% at 2 lpm. She has worsening pedal edema, 2-3+; has been taking furosemide every other day. She is encouraged to take daily x next 5 days then resume every other day if edema has improved; keep feet and legs elevated.   Chronic pain-she is followed by Dr. Holley Raring at Pain management clinic. Daughter is to check on next appointment to see if it can be virtual. Continue oxycodone acetaminophen 10/325 mg BID as directed; gabapentin, ibuprofen 800 mg, Cyclobenzaprine 86m 2 tabs QHS as directed.  Follow up Palliative Care Visit: Palliative care will continue to follow for complex medical decision making, advance care planning, and clarification of goals. Return in 4 weeks or prn.    This visit was coded based on medical decision  making (MDM).  PPS: 30%  HOSPICE ELIGIBILITY/DIAGNOSIS: TBD  Chief Complaint: Palliative Medicine follow up visit.   HISTORY OF PRESENT ILLNESS:  Brittney [name] is a 71 y.o. 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 Most recent hospitalization 12/1-12/02/2021 due to cellulitis of RLE.   Patient with two new skin tears to LLE; sustained when dog scratched her. Wound was still bleeding when [place] saw patient yesterday and wanted patient sent to ED; she declined to go. Daughter had applied pressure dressing; bleeding had since stopped. She also has a new area to left great toe. She endorses chronic pain. She is receiving HHPT; therapist arrived during visit today. Patient reports not feeling well; denies specific complaints. A 10-point review of systems is negative, except for the pertinent positives and negatives detailed in the HPI.    History obtained from review of EMR, discussion with primary team, and interview with family, facility staff/caregiver and/or Brittney [name].  I reviewed available labs, medications, imaging, studies and related documents from the EMR.  Records reviewed and summarized above.    Physical Exam:  Pulse 77, sats 85% on RA, 94 at 2 lpm  Constitutional: NAD General: frail appearing, obese  EYES: anicteric sclera, lids intact, no discharge  ENMT: intact hearing, oral mucous membranes moist, dentition intact CV: S1S2, RRR, 2-3+ pedal edema,R > L Pulmonary:upper lobes clear, no increased work of breathing, occasional cough Abdomen:  normo-active BS + 4 quadrants, soft and non tender, no ascites GU: deferred MSK: non- ambulatory Skin: warm and dry, tip of left great to black, toe with swelling, erythema, warmth and tender to touch skin tear x 2 LLE; distal wound deeper; no signs of infection. RLE chronic wound, wound bed pink, small amount of bleeding.  Neuro: generalized weakness, A and O x 3, pleasant Psych: non-anxious  affect Hem/lymph/immuno: no widespread bruising   Thank you for the opportunity to participate in the care of Brittney [name].  The palliative care team will continue to follow. Please call our office at [phone] if we can be of additional assistance.   Ezekiel Slocumb, NP   COVID-19 PATIENT SCREENING TOOL Asked and negative response unless otherwise noted:   Have you had symptoms of covid, tested positive or been in contact with someone with symptoms/positive test in the past 5-10 days? No

## 2022-01-13 ENCOUNTER — Telehealth: Payer: Self-pay | Admitting: Student

## 2022-01-13 NOTE — Telephone Encounter (Signed)
Returned call to patient's daughter [name]. She has questions on who is in charge of patient's care. Explained that palliative works collaboratively with [place]. Patient currently has home health involved. She does state patient still has increased LE edema, weeping. She is advised to continue furosemide daily. She is currently on antibiotics for skin infection.

## 2022-02-01 ENCOUNTER — Other Ambulatory Visit: Payer: Medicare Other | Admitting: Student

## 2022-02-01 ENCOUNTER — Other Ambulatory Visit: Payer: Self-pay

## 2022-02-24 ENCOUNTER — Other Ambulatory Visit: Payer: Self-pay

## 2022-02-24 ENCOUNTER — Inpatient Hospital Stay
Admission: EM | Admit: 2022-02-24 | Discharge: 2022-02-26 | DRG: 091 | Disposition: A | Discharge status: Hospice | Payer: Medicare Other | Attending: Internal Medicine | Admitting: Internal Medicine

## 2022-02-24 ENCOUNTER — Encounter: Payer: Self-pay | Admitting: Oncology

## 2022-02-24 ENCOUNTER — Emergency Department: Payer: Medicare Other

## 2022-02-24 ENCOUNTER — Encounter: Payer: Self-pay | Admitting: Emergency Medicine

## 2022-02-24 DIAGNOSIS — E113299 Type 2 diabetes mellitus with mild nonproliferative diabetic retinopathy without macular edema, unspecified eye: Secondary | ICD-10-CM | POA: Diagnosis present

## 2022-02-24 DIAGNOSIS — G9341 Metabolic encephalopathy: Secondary | ICD-10-CM | POA: Diagnosis present

## 2022-02-24 DIAGNOSIS — I5032 Chronic diastolic (congestive) heart failure: Secondary | ICD-10-CM | POA: Diagnosis present

## 2022-02-24 DIAGNOSIS — E1122 Type 2 diabetes mellitus with diabetic chronic kidney disease: Secondary | ICD-10-CM | POA: Diagnosis present

## 2022-02-24 DIAGNOSIS — G8929 Other chronic pain: Secondary | ICD-10-CM

## 2022-02-24 DIAGNOSIS — T424X5A Adverse effect of benzodiazepines, initial encounter: Secondary | ICD-10-CM | POA: Diagnosis present

## 2022-02-24 DIAGNOSIS — Z8249 Family history of ischemic heart disease and other diseases of the circulatory system: Secondary | ICD-10-CM

## 2022-02-24 DIAGNOSIS — Z91013 Allergy to seafood: Secondary | ICD-10-CM

## 2022-02-24 DIAGNOSIS — I130 Hypertensive heart and chronic kidney disease with heart failure and stage 1 through stage 4 chronic kidney disease, or unspecified chronic kidney disease: Secondary | ICD-10-CM | POA: Diagnosis present

## 2022-02-24 DIAGNOSIS — T402X5A Adverse effect of other opioids, initial encounter: Secondary | ICD-10-CM | POA: Diagnosis present

## 2022-02-24 DIAGNOSIS — Z20822 Contact with and (suspected) exposure to covid-19: Secondary | ICD-10-CM | POA: Diagnosis present

## 2022-02-24 DIAGNOSIS — Z9641 Presence of insulin pump (external) (internal): Secondary | ICD-10-CM | POA: Diagnosis present

## 2022-02-24 DIAGNOSIS — E039 Hypothyroidism, unspecified: Secondary | ICD-10-CM | POA: Diagnosis present

## 2022-02-24 DIAGNOSIS — Z7982 Long term (current) use of aspirin: Secondary | ICD-10-CM

## 2022-02-24 DIAGNOSIS — N182 Chronic kidney disease, stage 2 (mild): Secondary | ICD-10-CM | POA: Diagnosis present

## 2022-02-24 DIAGNOSIS — G928 Other toxic encephalopathy: Principal | ICD-10-CM | POA: Diagnosis present

## 2022-02-24 DIAGNOSIS — Z90710 Acquired absence of both cervix and uterus: Secondary | ICD-10-CM

## 2022-02-24 DIAGNOSIS — R410 Disorientation, unspecified: Secondary | ICD-10-CM

## 2022-02-24 DIAGNOSIS — M545 Low back pain, unspecified: Secondary | ICD-10-CM

## 2022-02-24 DIAGNOSIS — I272 Pulmonary hypertension, unspecified: Secondary | ICD-10-CM | POA: Diagnosis present

## 2022-02-24 DIAGNOSIS — K3184 Gastroparesis: Secondary | ICD-10-CM | POA: Diagnosis present

## 2022-02-24 DIAGNOSIS — I10 Essential (primary) hypertension: Secondary | ICD-10-CM | POA: Diagnosis present

## 2022-02-24 DIAGNOSIS — Z9981 Dependence on supplemental oxygen: Secondary | ICD-10-CM

## 2022-02-24 DIAGNOSIS — I2510 Atherosclerotic heart disease of native coronary artery without angina pectoris: Secondary | ICD-10-CM

## 2022-02-24 DIAGNOSIS — E785 Hyperlipidemia, unspecified: Secondary | ICD-10-CM | POA: Diagnosis present

## 2022-02-24 DIAGNOSIS — E11649 Type 2 diabetes mellitus with hypoglycemia without coma: Secondary | ICD-10-CM | POA: Diagnosis present

## 2022-02-24 DIAGNOSIS — G4733 Obstructive sleep apnea (adult) (pediatric): Secondary | ICD-10-CM | POA: Diagnosis present

## 2022-02-24 DIAGNOSIS — Z79890 Hormone replacement therapy (postmenopausal): Secondary | ICD-10-CM

## 2022-02-24 DIAGNOSIS — K7460 Unspecified cirrhosis of liver: Secondary | ICD-10-CM

## 2022-02-24 DIAGNOSIS — J9622 Acute and chronic respiratory failure with hypercapnia: Secondary | ICD-10-CM

## 2022-02-24 DIAGNOSIS — Z7951 Long term (current) use of inhaled steroids: Secondary | ICD-10-CM

## 2022-02-24 DIAGNOSIS — G894 Chronic pain syndrome: Secondary | ICD-10-CM | POA: Diagnosis present

## 2022-02-24 DIAGNOSIS — E1143 Type 2 diabetes mellitus with diabetic autonomic (poly)neuropathy: Secondary | ICD-10-CM | POA: Diagnosis present

## 2022-02-24 DIAGNOSIS — E7220 Disorder of urea cycle metabolism, unspecified: Secondary | ICD-10-CM

## 2022-02-24 DIAGNOSIS — E1151 Type 2 diabetes mellitus with diabetic peripheral angiopathy without gangrene: Secondary | ICD-10-CM | POA: Diagnosis present

## 2022-02-24 DIAGNOSIS — Z809 Family history of malignant neoplasm, unspecified: Secondary | ICD-10-CM

## 2022-02-24 DIAGNOSIS — Z515 Encounter for palliative care: Secondary | ICD-10-CM

## 2022-02-24 DIAGNOSIS — I252 Old myocardial infarction: Secondary | ICD-10-CM

## 2022-02-24 DIAGNOSIS — Z79899 Other long term (current) drug therapy: Secondary | ICD-10-CM

## 2022-02-24 DIAGNOSIS — K7581 Nonalcoholic steatohepatitis (NASH): Secondary | ICD-10-CM | POA: Diagnosis present

## 2022-02-24 DIAGNOSIS — Z85820 Personal history of malignant melanoma of skin: Secondary | ICD-10-CM

## 2022-02-24 DIAGNOSIS — Z993 Dependence on wheelchair: Secondary | ICD-10-CM

## 2022-02-24 DIAGNOSIS — T43015A Adverse effect of tricyclic antidepressants, initial encounter: Secondary | ICD-10-CM | POA: Diagnosis present

## 2022-02-24 DIAGNOSIS — Z7401 Bed confinement status: Secondary | ICD-10-CM

## 2022-02-24 DIAGNOSIS — Z794 Long term (current) use of insulin: Secondary | ICD-10-CM

## 2022-02-24 DIAGNOSIS — Z6841 Body Mass Index (BMI) 40.0 and over, adult: Secondary | ICD-10-CM

## 2022-02-24 DIAGNOSIS — Z833 Family history of diabetes mellitus: Secondary | ICD-10-CM

## 2022-02-24 DIAGNOSIS — E662 Morbid (severe) obesity with alveolar hypoventilation: Secondary | ICD-10-CM | POA: Diagnosis present

## 2022-02-24 LAB — RESP PANEL BY RT-PCR (FLU A&B, COVID) ARPGX2
Influenza A by PCR: NEGATIVE
Influenza B by PCR: NEGATIVE
SARS Coronavirus 2 by RT PCR: NEGATIVE

## 2022-02-24 LAB — BLOOD GAS, ARTERIAL
Acid-Base Excess: 11.6 mmol/L — ABNORMAL HIGH (ref 0.0–2.0)
Bicarbonate: 39.9 mmol/L — ABNORMAL HIGH (ref 20.0–28.0)
O2 Content: 3 L/min
O2 Saturation: 99.8 %
Patient temperature: 37
pCO2 arterial: 69 mmHg (ref 32–48)
pH, Arterial: 7.37 (ref 7.35–7.45)
pO2, Arterial: 129 mmHg — ABNORMAL HIGH (ref 83–108)

## 2022-02-24 LAB — CBC
HCT: 37.2 % (ref 36.0–46.0)
Hemoglobin: 10.6 g/dL — ABNORMAL LOW (ref 12.0–15.0)
MCH: 26.1 pg (ref 26.0–34.0)
MCHC: 28.5 g/dL — ABNORMAL LOW (ref 30.0–36.0)
MCV: 91.6 fL (ref 80.0–100.0)
Platelets: 272 10*3/uL (ref 150–400)
RBC: 4.06 MIL/uL (ref 3.87–5.11)
RDW: 14.5 % (ref 11.5–15.5)
WBC: 7.1 10*3/uL (ref 4.0–10.5)
nRBC: 0 % (ref 0.0–0.2)

## 2022-02-24 LAB — HEPATIC FUNCTION PANEL
ALT: 9 U/L (ref 0–44)
AST: 22 U/L (ref 15–41)
Albumin: 2.8 g/dL — ABNORMAL LOW (ref 3.5–5.0)
Alkaline Phosphatase: 72 U/L (ref 38–126)
Bilirubin, Direct: 0.2 mg/dL (ref 0.0–0.2)
Indirect Bilirubin: 0.4 mg/dL (ref 0.3–0.9)
Total Bilirubin: 0.6 mg/dL (ref 0.3–1.2)
Total Protein: 6.3 g/dL — ABNORMAL LOW (ref 6.5–8.1)

## 2022-02-24 LAB — CBG MONITORING, ED
Glucose-Capillary: 47 mg/dL — ABNORMAL LOW (ref 70–99)
Glucose-Capillary: 105 mg/dL — ABNORMAL HIGH (ref 70–99)

## 2022-02-24 LAB — BASIC METABOLIC PANEL
Anion gap: 8 (ref 5–15)
BUN: 10 mg/dL (ref 8–23)
CO2: 34 mmol/L — ABNORMAL HIGH (ref 22–32)
Calcium: 9.9 mg/dL (ref 8.9–10.3)
Chloride: 100 mmol/L (ref 98–111)
Creatinine, Ser: 1.02 mg/dL — ABNORMAL HIGH (ref 0.44–1.00)
GFR, Estimated: 60 mL/min — ABNORMAL LOW (ref >60)
Glucose, Bld: 57 mg/dL — ABNORMAL LOW (ref 70–99)
Potassium: 4.2 mmol/L (ref 3.5–5.1)
Sodium: 142 mmol/L (ref 135–145)

## 2022-02-24 LAB — ETHANOL: Alcohol, Ethyl (B): <10 mg/dL (ref <10)

## 2022-02-24 LAB — AMMONIA: Ammonia: 48 umol/L — ABNORMAL HIGH (ref 9–35)

## 2022-02-24 MED ORDER — SODIUM CHLORIDE 0.9 % IV BOLUS
500.0000 mL | Freq: Once | INTRAVENOUS | Status: AC
  Administered 2022-02-24: 500 mL via INTRAVENOUS

## 2022-02-24 MED ORDER — DEXTROSE 50 % IV SOLN
25.0000 g | Freq: Once | INTRAVENOUS | Status: AC
  Administered 2022-02-24: 25 g via INTRAVENOUS
  Filled 2022-02-24: qty 50

## 2022-02-24 NOTE — ED Triage Notes (Signed)
Pt to ED via EMS from home c/o fatigue, weakness and AMS.  Daughter with patient states she insisted patient be brought in for these reasons.  States was released from [facility] Saturday after toe amputation, normal Saturday and Sunday and noticed symptoms start Monday and worsening today.  States noticed her twitching, harder to arouse.  Daughter states pt's blood sugar at home was 91 and hour later 61.  Pt checks own CBG at home and insulin pump for correction.  Patient denies SOB, chronic 3L oxygen use at home, states pain to bottom after moving from EMS to hospital stretcher, pt A&Ox4. ?

## 2022-02-24 NOTE — ED Provider Notes (Signed)
? ?[facility] ?Provider Note ? ? ? Event Date/Time  ? First MD Initiated Contact with Patient 02/24/22 2213   ?  (approximate) ? ?History  ? ?Chief Complaint: Weakness, confusion ? ?HPI ? ?Brittney [name] is a 71 y.o. female with a past medical history of obesity, CKD, oxygen dependent 3 L 24/7, diabetes, hypertension, hyperlipidemia, presents to the emergency department for generalized fatigue weakness and decreased responsiveness.  According to the daughter patient was recently admitted to [facility] for a left toe infection ultimately requiring an amputation.  She states however throughout the admission the patient was acting confused at times but this was not addressed during the admission and the patient was ultimately discharged home last week.  Daughter states the patient did well over the weekend however since Monday has become increasingly more weak and less interactive and today was fairly lethargic and was very difficult to arouse.  Patient was sleeping throughout the day.  Here more the same, patient will awaken to voice will answer questions briefly and accurately but then falls back asleep if not actively engaged. ? ?Physical Exam  ? ?Triage Vital Signs: ?ED Triage Vitals  ?Enc Vitals Group  ?   BP 02/24/22 2049 (!) 118/58  ?   Pulse Rate 02/24/22 2049 (!) 50  ?   Resp 02/24/22 2049 20  ?   Temp --   ?   Temp src --   ?   SpO2 02/24/22 2049 94 %  ?   Weight 02/24/22 2050 (!) 330 lb (149.7 kg)  ?   Height 02/24/22 2050 5\' 5"  (1.651 m)  ?   Head Circumference --   ?   Peak Flow --   ?   Pain Score 02/24/22 2050 8  ?   Pain Loc --   ?   Pain Edu? --   ?   Excl. in [place]? --   ? ? ?Most recent vital signs: ?Vitals:  ? 02/24/22 2049 02/24/22 2204  ?BP: (!) 118/58 137/62  ?Pulse: (!) 50 (!) 104  ?Resp: 20 14  ?SpO2: 94% 95%  ? ? ?General: Somnolent but awakens to voice.  Morbidly obese. ?CV:  Good peripheral perfusion.  Regular rate and rhythm  ?Resp:  Normal effort.  Equal breath  sounds bilaterally.  ?Abd:  No distention.  Soft, nontender.  No rebound or guarding. ? ? ?ED Results / Procedures / Treatments  ? ?EKG ? ?EKG viewed and interpreted by myself shows sinus bradycardia 51 bpm with a narrow QRS, normal axis, normal intervals, no concerning ST changes. ? ?RADIOLOGY ? ?CT head pending ? ? ?MEDICATIONS ORDERED IN ED: ?Medications  ?sodium chloride 0.9 % bolus 500 mL (500 mLs Intravenous New Bag/Given 02/24/22 2214)  ?dextrose 50 % solution 25 g (25 g Intravenous Given 02/24/22 2209)  ? ? ? ?IMPRESSION / MDM / ASSESSMENT AND PLAN / ED COURSE  ?I reviewed the triage vital signs and the nursing notes. ? ?Patient presents to the emergency department for decreased responsiveness, weakness and increased somnolence.  Here the patient seems to have shallow breathing and is requiring 3 L of oxygen which is her baseline.  Given the shallow breathing and increased somnolence we will obtain an ABG to evaluate for hypercarbia.  We will check labs.  We will obtain a urine sample as well as a CT scan of the head.  Given the patient's recent hospitalization we will also obtain a COVID swab.  Daughter is agreeable to this  plan.  Patient is on pain medication however the daughter states the patient has not received any pain medication yesterday or today due to the degree of somnolence. ? ?Patient CBG has resulted at 47 we will dose dextrose 25 g of D50.  After dextrose patient has no more interactive still remains somnolent but continues to awaken to voice easily and answers questions appropriately but will fall back asleep if not actively engaged. ? ?I reviewed the patient's discharge summary from 02/20/2022 from [facility].  Patient was admitted for nearly 3 weeks.  Patient had a tibia fracture in November which is left her largely bedbound since that fracture.  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  Patient was also noted to have metabolic  encephalopathy during that admission thought to be caused by either hypoxia or polypharmacy. ? ?ABG is resulted with a PCO2 of 69, this could be contributing to the patient's somnolence.  We will place the patient on BiPAP to see if this improves the patient's mental state.  Remainder the patient's lab work, urinalysis and CT scan are pending.  Patient care signed out to oncoming provider. ? ?FINAL CLINICAL IMPRESSION(S) / ED DIAGNOSES  ? ?Altered mental status ?Confusion ? ?Note:  This document was prepared using Dragon voice recognition software and may include unintentional dictation errors. ?  ?Harvest Dark, MD ?02/24/22 2300 ? ?

## 2022-02-25 ENCOUNTER — Encounter: Payer: Self-pay | Admitting: Internal Medicine

## 2022-02-25 ENCOUNTER — Telehealth: Payer: Self-pay | Admitting: Student

## 2022-02-25 DIAGNOSIS — G9341 Metabolic encephalopathy: Secondary | ICD-10-CM | POA: Diagnosis not present

## 2022-02-25 DIAGNOSIS — E11649 Type 2 diabetes mellitus with hypoglycemia without coma: Secondary | ICD-10-CM | POA: Diagnosis present

## 2022-02-25 DIAGNOSIS — E039 Hypothyroidism, unspecified: Secondary | ICD-10-CM | POA: Diagnosis present

## 2022-02-25 DIAGNOSIS — I2510 Atherosclerotic heart disease of native coronary artery without angina pectoris: Secondary | ICD-10-CM

## 2022-02-25 DIAGNOSIS — Z20822 Contact with and (suspected) exposure to covid-19: Secondary | ICD-10-CM | POA: Diagnosis present

## 2022-02-25 DIAGNOSIS — E1122 Type 2 diabetes mellitus with diabetic chronic kidney disease: Secondary | ICD-10-CM | POA: Diagnosis present

## 2022-02-25 DIAGNOSIS — Z9981 Dependence on supplemental oxygen: Secondary | ICD-10-CM | POA: Diagnosis not present

## 2022-02-25 DIAGNOSIS — Z9641 Presence of insulin pump (external) (internal): Secondary | ICD-10-CM | POA: Diagnosis present

## 2022-02-25 DIAGNOSIS — I272 Pulmonary hypertension, unspecified: Secondary | ICD-10-CM | POA: Diagnosis present

## 2022-02-25 DIAGNOSIS — Z90710 Acquired absence of both cervix and uterus: Secondary | ICD-10-CM | POA: Diagnosis not present

## 2022-02-25 DIAGNOSIS — G928 Other toxic encephalopathy: Secondary | ICD-10-CM | POA: Diagnosis present

## 2022-02-25 DIAGNOSIS — K7581 Nonalcoholic steatohepatitis (NASH): Secondary | ICD-10-CM | POA: Diagnosis present

## 2022-02-25 DIAGNOSIS — Z993 Dependence on wheelchair: Secondary | ICD-10-CM | POA: Diagnosis not present

## 2022-02-25 DIAGNOSIS — I5032 Chronic diastolic (congestive) heart failure: Secondary | ICD-10-CM | POA: Diagnosis present

## 2022-02-25 DIAGNOSIS — G894 Chronic pain syndrome: Secondary | ICD-10-CM | POA: Diagnosis present

## 2022-02-25 DIAGNOSIS — E785 Hyperlipidemia, unspecified: Secondary | ICD-10-CM | POA: Diagnosis present

## 2022-02-25 DIAGNOSIS — E1143 Type 2 diabetes mellitus with diabetic autonomic (poly)neuropathy: Secondary | ICD-10-CM | POA: Diagnosis present

## 2022-02-25 DIAGNOSIS — J9622 Acute and chronic respiratory failure with hypercapnia: Secondary | ICD-10-CM | POA: Diagnosis present

## 2022-02-25 DIAGNOSIS — E662 Morbid (severe) obesity with alveolar hypoventilation: Secondary | ICD-10-CM | POA: Diagnosis present

## 2022-02-25 DIAGNOSIS — R410 Disorientation, unspecified: Secondary | ICD-10-CM | POA: Diagnosis present

## 2022-02-25 DIAGNOSIS — E113299 Type 2 diabetes mellitus with mild nonproliferative diabetic retinopathy without macular edema, unspecified eye: Secondary | ICD-10-CM | POA: Diagnosis present

## 2022-02-25 DIAGNOSIS — E1151 Type 2 diabetes mellitus with diabetic peripheral angiopathy without gangrene: Secondary | ICD-10-CM | POA: Diagnosis present

## 2022-02-25 DIAGNOSIS — Z515 Encounter for palliative care: Secondary | ICD-10-CM | POA: Diagnosis not present

## 2022-02-25 DIAGNOSIS — I130 Hypertensive heart and chronic kidney disease with heart failure and stage 1 through stage 4 chronic kidney disease, or unspecified chronic kidney disease: Secondary | ICD-10-CM | POA: Diagnosis present

## 2022-02-25 DIAGNOSIS — I252 Old myocardial infarction: Secondary | ICD-10-CM | POA: Diagnosis not present

## 2022-02-25 DIAGNOSIS — E7220 Disorder of urea cycle metabolism, unspecified: Secondary | ICD-10-CM

## 2022-02-25 DIAGNOSIS — Z6841 Body Mass Index (BMI) 40.0 and over, adult: Secondary | ICD-10-CM | POA: Diagnosis not present

## 2022-02-25 LAB — BLOOD GAS, VENOUS
Acid-base deficit: 8.7 mmol/L — ABNORMAL HIGH (ref 0.0–2.0)
Bicarbonate: 19.5 mmol/L — ABNORMAL LOW (ref 20.0–28.0)
Delivery systems: POSITIVE
FIO2: 35 %
O2 Saturation: 97.8 %
Patient temperature: 37
pCO2, Ven: 50 mmHg (ref 44–60)
pH, Ven: 7.2 — ABNORMAL LOW (ref 7.25–7.43)
pO2, Ven: 113 mmHg — ABNORMAL HIGH (ref 32–45)

## 2022-02-25 LAB — URINALYSIS, COMPLETE (UACMP) WITH MICROSCOPIC
Bacteria, UA: NONE SEEN
Bilirubin Urine: NEGATIVE
Glucose, UA: NEGATIVE mg/dL
Hgb urine dipstick: NEGATIVE
Ketones, ur: NEGATIVE mg/dL
Leukocytes,Ua: NEGATIVE
Nitrite: NEGATIVE
Protein, ur: NEGATIVE mg/dL
Specific Gravity, Urine: 1.017 (ref 1.005–1.030)
pH: 5 (ref 5.0–8.0)

## 2022-02-25 LAB — URINE DRUG SCREEN, QUALITATIVE (ARMC ONLY)
Amphetamines, Ur Screen: NOT DETECTED
Barbiturates, Ur Screen: NOT DETECTED
Benzodiazepine, Ur Scrn: POSITIVE — AB
Cannabinoid 50 Ng, Ur ~~LOC~~: NOT DETECTED
Cocaine Metabolite,Ur ~~LOC~~: NOT DETECTED
MDMA (Ecstasy)Ur Screen: NOT DETECTED
Methadone Scn, Ur: NOT DETECTED
Opiate, Ur Screen: POSITIVE — AB
Phencyclidine (PCP) Ur S: NOT DETECTED
Tricyclic, Ur Screen: POSITIVE — AB

## 2022-02-25 LAB — GLUCOSE, CAPILLARY
Glucose-Capillary: 142 mg/dL — ABNORMAL HIGH (ref 70–99)
Glucose-Capillary: 170 mg/dL — ABNORMAL HIGH (ref 70–99)
Glucose-Capillary: 184 mg/dL — ABNORMAL HIGH (ref 70–99)
Glucose-Capillary: 208 mg/dL — ABNORMAL HIGH (ref 70–99)
Glucose-Capillary: 208 mg/dL — ABNORMAL HIGH (ref 70–99)
Glucose-Capillary: 221 mg/dL — ABNORMAL HIGH (ref 70–99)
Glucose-Capillary: 55 mg/dL — ABNORMAL LOW (ref 70–99)
Glucose-Capillary: 60 mg/dL — ABNORMAL LOW (ref 70–99)
Glucose-Capillary: 64 mg/dL — ABNORMAL LOW (ref 70–99)
Glucose-Capillary: 81 mg/dL (ref 70–99)

## 2022-02-25 LAB — MRSA NEXT GEN BY PCR, NASAL: MRSA by PCR Next Gen: DETECTED — AB

## 2022-02-25 LAB — CBG MONITORING, ED
Glucose-Capillary: 71 mg/dL (ref 70–99)
Glucose-Capillary: 88 mg/dL (ref 70–99)

## 2022-02-25 MED ORDER — LEVOTHYROXINE SODIUM 100 MCG PO TABS
200.0000 ug | ORAL_TABLET | Freq: Every day | ORAL | Status: DC
  Administered 2022-02-25 – 2022-02-26 (×2): 200 ug via ORAL
  Filled 2022-02-25 (×2): qty 2

## 2022-02-25 MED ORDER — ALPRAZOLAM 0.5 MG PO TABS
0.5000 mg | ORAL_TABLET | Freq: Two times a day (BID) | ORAL | Status: DC
  Administered 2022-02-25 – 2022-02-26 (×3): 0.5 mg via ORAL
  Filled 2022-02-25 (×3): qty 1

## 2022-02-25 MED ORDER — ACETAMINOPHEN 325 MG PO TABS: 650.0000 mg | ORAL_TABLET | Freq: Four times a day (QID) | ORAL | Status: DC | PRN

## 2022-02-25 MED ORDER — ACETAMINOPHEN 650 MG RE SUPP: 650.0000 mg | Freq: Four times a day (QID) | RECTAL | Status: DC | PRN

## 2022-02-25 MED ORDER — GABAPENTIN 600 MG PO TABS
1800.0000 mg | ORAL_TABLET | Freq: Every day | ORAL | Status: DC
  Administered 2022-02-25: 1800 mg via ORAL
  Filled 2022-02-25: qty 3

## 2022-02-25 MED ORDER — OXYCODONE-ACETAMINOPHEN 10-325 MG PO TABS
1.0000 | ORAL_TABLET | Freq: Two times a day (BID) | ORAL | Status: DC | PRN
Start: 2022-02-25 — End: 2022-02-25

## 2022-02-25 MED ORDER — OXYCODONE-ACETAMINOPHEN 5-325 MG PO TABS: 1.0000 | ORAL_TABLET | Freq: Two times a day (BID) | ORAL | Status: DC | PRN

## 2022-02-25 MED ORDER — PRAVASTATIN SODIUM 20 MG PO TABS
10.0000 mg | ORAL_TABLET | Freq: Every day | ORAL | Status: DC
Start: 2022-02-25 — End: 2022-02-26
  Filled 2022-02-25: qty 1

## 2022-02-25 MED ORDER — IPRATROPIUM-ALBUTEROL 0.5-2.5 (3) MG/3ML IN SOLN: 3.0000 mL | Freq: Four times a day (QID) | RESPIRATORY_TRACT | Status: DC | PRN

## 2022-02-25 MED ORDER — ALBUTEROL SULFATE (2.5 MG/3ML) 0.083% IN NEBU: 3.0000 mL | INHALATION_SOLUTION | RESPIRATORY_TRACT | Status: DC | PRN

## 2022-02-25 MED ORDER — TRAZODONE HCL 50 MG PO TABS
150.0000 mg | ORAL_TABLET | Freq: Every day | ORAL | Status: DC
  Administered 2022-02-25: 150 mg via ORAL
  Filled 2022-02-25: qty 1

## 2022-02-25 MED ORDER — CYCLOBENZAPRINE HCL 10 MG PO TABS
20.0000 mg | ORAL_TABLET | Freq: Every day | ORAL | Status: DC
  Administered 2022-02-25: 20 mg via ORAL
  Filled 2022-02-25: qty 2

## 2022-02-25 MED ORDER — MUPIROCIN 2 % EX OINT
1.0000 "application " | TOPICAL_OINTMENT | Freq: Two times a day (BID) | CUTANEOUS | Status: DC
  Administered 2022-02-25 – 2022-02-26 (×2): 1 via NASAL
  Filled 2022-02-25: qty 22

## 2022-02-25 MED ORDER — ESCITALOPRAM OXALATE 10 MG PO TABS
10.0000 mg | ORAL_TABLET | Freq: Every day | ORAL | Status: DC
  Administered 2022-02-25 – 2022-02-26 (×2): 10 mg via ORAL
  Filled 2022-02-25 (×2): qty 1

## 2022-02-25 MED ORDER — DEXTROSE 50 % IV SOLN
1.0000 | Freq: Once | INTRAVENOUS | Status: AC
  Administered 2022-02-25: 50 mL via INTRAVENOUS
  Filled 2022-02-25: qty 50

## 2022-02-25 MED ORDER — POTASSIUM CHLORIDE CRYS ER 10 MEQ PO TBCR
10.0000 meq | EXTENDED_RELEASE_TABLET | Freq: Every day | ORAL | Status: DC
  Administered 2022-02-25 – 2022-02-26 (×2): 10 meq via ORAL
  Filled 2022-02-25 (×2): qty 1

## 2022-02-25 MED ORDER — VITAMIN D 25 MCG (1000 UNIT) PO TABS
5000.0000 [IU] | ORAL_TABLET | Freq: Every day | ORAL | Status: DC
  Administered 2022-02-25 – 2022-02-26 (×2): 5000 [IU] via ORAL
  Filled 2022-02-25 (×2): qty 5

## 2022-02-25 MED ORDER — PRAMIPEXOLE DIHYDROCHLORIDE 1 MG PO TABS
1.0000 mg | ORAL_TABLET | Freq: Every day | ORAL | Status: DC
  Administered 2022-02-25: 1 mg via ORAL
  Filled 2022-02-25 (×2): qty 1

## 2022-02-25 MED ORDER — INSULIN ASPART 100 UNIT/ML IJ SOLN
0.0000 [IU] | INTRAMUSCULAR | Status: DC
  Administered 2022-02-25 (×3): 7 [IU] via SUBCUTANEOUS
  Administered 2022-02-25: 4 [IU] via SUBCUTANEOUS
  Administered 2022-02-26: 7 [IU] via SUBCUTANEOUS
  Administered 2022-02-26: 4 [IU] via SUBCUTANEOUS
  Administered 2022-02-26: 3 [IU] via SUBCUTANEOUS
  Filled 2022-02-25 (×7): qty 1

## 2022-02-25 MED ORDER — CHLORHEXIDINE GLUCONATE CLOTH 2 % EX PADS
6.0000 | MEDICATED_PAD | Freq: Every day | CUTANEOUS | Status: DC
  Administered 2022-02-26: 6 via TOPICAL

## 2022-02-25 MED ORDER — ONDANSETRON HCL 4 MG PO TABS: 4.0000 mg | ORAL_TABLET | Freq: Four times a day (QID) | ORAL | Status: DC | PRN

## 2022-02-25 MED ORDER — ENOXAPARIN SODIUM 80 MG/0.8ML IJ SOSY
0.5000 mg/kg | PREFILLED_SYRINGE | INTRAMUSCULAR | Status: DC
  Administered 2022-02-25 – 2022-02-26 (×2): 75 mg via SUBCUTANEOUS
  Filled 2022-02-25 (×2): qty 0.8

## 2022-02-25 MED ORDER — FUROSEMIDE 40 MG PO TABS
40.0000 mg | ORAL_TABLET | Freq: Every day | ORAL | Status: DC
  Administered 2022-02-25 – 2022-02-26 (×2): 40 mg via ORAL
  Filled 2022-02-25 (×2): qty 1

## 2022-02-25 MED ORDER — ORAL CARE MOUTH RINSE
15.0000 mL | Freq: Two times a day (BID) | OROMUCOSAL | Status: DC
  Administered 2022-02-25 – 2022-02-26 (×3): 15 mL via OROMUCOSAL

## 2022-02-25 MED ORDER — DEXTROSE-NACL 5-0.9 % IV SOLN: INTRAVENOUS | Status: DC

## 2022-02-25 MED ORDER — ONDANSETRON HCL 4 MG/2ML IJ SOLN: 4.0000 mg | Freq: Four times a day (QID) | INTRAMUSCULAR | Status: DC | PRN

## 2022-02-25 MED ORDER — BUDESONIDE 0.5 MG/2ML IN SUSP: 0.5000 mg | Freq: Two times a day (BID) | RESPIRATORY_TRACT | Status: DC | PRN

## 2022-02-25 MED ORDER — PANTOPRAZOLE SODIUM 40 MG PO TBEC
40.0000 mg | DELAYED_RELEASE_TABLET | Freq: Two times a day (BID) | ORAL | Status: DC
  Administered 2022-02-25 – 2022-02-26 (×3): 40 mg via ORAL
  Filled 2022-02-25 (×3): qty 1

## 2022-02-25 MED ORDER — ASPIRIN EC 81 MG PO TBEC
81.0000 mg | DELAYED_RELEASE_TABLET | Freq: Every day | ORAL | Status: DC
  Administered 2022-02-25 – 2022-02-26 (×2): 81 mg via ORAL
  Filled 2022-02-25 (×2): qty 1

## 2022-02-25 MED ORDER — OXYCODONE HCL 5 MG PO TABS
5.0000 mg | ORAL_TABLET | Freq: Two times a day (BID) | ORAL | Status: DC | PRN
  Administered 2022-02-25: 5 mg via ORAL
  Filled 2022-02-25: qty 1

## 2022-02-25 NOTE — Progress Notes (Addendum)
Interval events noted.  She feels better today.  Plan of care was discussed with the patient and Brittney [name], her daughter, at the bedside.  Patient is under palliative care in the outpatient setting.  Her daughter said they are ready to "go up to the next step".  She requested hospice services at home but made it clear that she did not want patient to be on comfort care.  She also requested evaluation of wounds on the legs.  Wound care nurse has been consulted to evaluate wounds on the lower extremities.  She was concerned about low blood pressure at home and wanted to stop some of her antihypertensives.  Continue current treatment. ?

## 2022-02-25 NOTE — Progress Notes (Signed)
Anticoagulation monitoring(Lovenox): ? ?71 yo female ordered Lovenox 40 mg Q24h ?   ?Filed Weights  ? 02/24/22 2050  ?Weight: (!) 149.7 kg (330 lb)  ? ?BMI  54.9  ? ?Lab Results  ?Component Value Date  ? CREATININE 1.02 (H) 02/24/2022  ? CREATININE 0.76 11/28/2021  ? CREATININE 0.85 11/27/2021  ? ?Estimated Creatinine Clearance: 78.4 mL/min (A) (by C-G formula based on SCr of 1.02 mg/dL (H)). ?Hemoglobin & Hematocrit  ?   ?Component Value Date/Time  ? HGB 10.6 (L) 02/24/2022 2103  ? HGB 12.6 12/10/2014 1133  ? HCT 37.2 02/24/2022 2103  ? HCT 39.5 12/10/2014 1133  ? ? ? ?Per Protocol for Patient with estCrcl > 30 ml/min and BMI > 30, will transition to Lovenox 75 mg Q24h.  ?  ? ? ?

## 2022-02-25 NOTE — Assessment & Plan Note (Signed)
Currently hypoglycemic so we will place on D5 and resume home insulin as appropriate ?

## 2022-02-25 NOTE — Assessment & Plan Note (Signed)
No complaints of chest pain ?Continue aspirin, carvedilol, pravastatin and telmisartan ?

## 2022-02-25 NOTE — Assessment & Plan Note (Signed)
Controlled ?Continue home carvedilol, hydralazine, furosemide ?

## 2022-02-25 NOTE — Assessment & Plan Note (Signed)
Complicating factor to overall prognosis and care especially in view of nonambulant status ?

## 2022-02-25 NOTE — Assessment & Plan Note (Signed)
CPAP nightly

## 2022-02-25 NOTE — Progress Notes (Signed)
Patient remains off bipap at this time after transfer to floor. Patient is eating crackers and drinking liquids. Will have RN to call when ready for bipap ?

## 2022-02-25 NOTE — Progress Notes (Signed)
Cross Cover ?Hypoglycemic event treated with amp d50.  Maintenance IVF D5NS increased to 100 ml/h ?

## 2022-02-25 NOTE — Assessment & Plan Note (Signed)
Likely secondary to sedating meds superimposed on OSA/OHS and chronic respiratory failure ?Hold sedating meds ?Continue BiPAP and titrate to alertness and wean as tolerated to home O2 ?

## 2022-02-25 NOTE — Progress Notes (Signed)
[place] [facility]) Hospital Liaison Note ?  ?Received request from Transitions of Care Manager, Caryl Pina, for hospice services at home after discharge. Chart and patient information under review by [facility] physician. Hospice eligibility pending. ?  ?Spoke with patient & daughter/ Hassel Neth initiate education related to hospice philosophy, services, and team approach to care. Both verbalized understanding of information given. Per discussion, the plan is for patient to discharge home via [facility] once cleared to DC.  ?  ?DME needs discussed. Patient has the following equipment in the home ?(Advance): ?Wheelchair ?BSC ?Shower bench ?O2 concentrator  ?CPAP ?Patient requests the following equipment for delivery: ?[facility] Bed ? ?Address verified and is correct in the chart. [name] is the family member to contact to arrange time of equipment delivery.  ?  ?Patient is a FULL code and will not need a signed DNR upon discharge. Please provide prescriptions at discharge as needed to ensure ongoing symptom management.  ?  ?[facility] information and contact numbers given to family & above information shared with TOC. ?  ?Please call with any questions/concerns.  ?  ?Thank you for the opportunity to participate in this patient's care. ?  ?Daphene Calamity, MSW ?[facility]  ?[phone] ? ?

## 2022-02-25 NOTE — Consult Note (Signed)
WOC Nurse Consult Note: ?Reason for Consult: Chronic, nonhealing wounds to bilateral LEs, R>L. Recent partial amputation of left great toe. ?Wound type: venous insufficiency, chronic, nonhealing toe wound ?Pressure Injury POA: N/A ?Measurement: 8cm x 9cm area on anterior RLE with 0.2cm depth. Granulating, small amount of serous drainage ?Wound bed:As described above ?Drainage (amount, consistency, odor) As described above ?Periwound: with evidence of previous wound healing ?Dressing procedure/placement/frequency: I have provided Nursing with guidance for topical wound care using the POC from [facility] wound care providers.  Specifically, will implement once daily NS cleansing to LE wounds and pat dry. Wounds are to be covered with silicone wound contact layer (Mepitel) and topped with an ABD pad, secured with Kerlix roll gauze. The left LE is to be wrapped with an ACE bandage. Topical care to the left great toe is with a povidone iodine swabstick and then a DSD. ? ?A mattress replacement is provided for mitigation of moisture and pressure redistribution. Our house antimicrobial textile is provided for intertriginous dermatitis. ? ?[place] nursing team will not follow, but will remain available to this patient, the nursing and medical teams.  Please re-consult if needed. ?Thanks, ?Maudie Flakes, MSN, RN, [place], [place], CWON-AP, [employer]  ?Pager# [phone]  ? ? ? ? ? ?  ?

## 2022-02-25 NOTE — Progress Notes (Signed)
Anticoagulation monitoring(Lovenox): ? ?71 yo female ordered Lovenox 40 mg Q24h ?   ?Filed Weights  ? 02/24/22 2050  ?Weight: (!) 149.7 kg (330 lb)  ? ?BMI 54.9  ? ?Lab Results  ?Component Value Date  ? CREATININE 1.02 (H) 02/24/2022  ? CREATININE 0.76 11/28/2021  ? CREATININE 0.85 11/27/2021  ? ?Estimated Creatinine Clearance: 78.4 mL/min (A) (by C-G formula based on SCr of 1.02 mg/dL (H)). ?Hemoglobin & Hematocrit  ?   ?Component Value Date/Time  ? HGB 10.6 (L) 02/24/2022 2103  ? HGB 12.6 12/10/2014 1133  ? HCT 37.2 02/24/2022 2103  ? HCT 39.5 12/10/2014 1133  ? ? ? ?Per Protocol for Patient with estCrcl > 30 ml/min and BMI > 30, will transition to Lovenox 75 mg Q24h.  ?  ? ? ?

## 2022-02-25 NOTE — Progress Notes (Addendum)
Inpatient Diabetes Program Recommendations ? ?AACE/ADA: New Consensus Statement on Inpatient Glycemic Control  ? ?Target Ranges:  Prepandial:   less than 140 mg/dL ?     Peak postprandial:   less than 180 mg/dL (1-2 hours) ?     Critically ill patients:  140 - 180 mg/dL  ? ? Latest Reference Range & Units 02/25/22 03:02 02/25/22 03:59 02/25/22 04:33 02/25/22 04:40 02/25/22 04:57 02/25/22 06:09 02/25/22 07:31  ?Glucose-Capillary 70 - 99 mg/dL 71 55 (L) 64 (L) 60 (L) 81 142 (H) 184 (H)  ? ? Latest Reference Range & Units 02/24/22 22:01 02/24/22 22:53 02/24/22 23:50  ?Glucose-Capillary 70 - 99 mg/dL 47 (L) 105 (H) 88  ? ?Review of Glycemic Control ? ?Diabetes history: DM2 ?Outpatient Diabetes medications: Medtronic 630G insulin pump with Humulin R U500 ?Current orders for Inpatient glycemic control: Novolog 0-20 units Q4H ? ?Inpatient Diabetes Program Recommendations:   ? ?Insulin: Once glucose is consistently over 180 mg/dl without any further hypoglycemia, will likely need to order basal insulin if insulin pump is not resumed.  ? ?Insulin Pump: Patient wants to resume her insulin pump. If insulin pump is resumed, please use Insulin Pump order set with CBGs and insulin pump frequency AC&HS and 2 am. ? ?NOTE:  Patient is followed by Dr. Gabriel Carina [facility] End[facility]) and was last seen on 10/19/21.  Patient uses a Medtronic 630G insulin pump with Humulin R U500 insulin as an outpatient. Per office note on 10/19/21 the following were insulin pump settings at that time: ? ?Basal insulin (using Humulin R U500 insulin) ?12A      1.2 units/hour ?11A      1.75 units/hour ?1030P  1.35 units/hour ?Total daily basal insulin:35.35 units/24 hours ?  ?Carb Coverage ?1:5       1 unit for every 5 grams of carbohydrates ?  ?Insulin Sensitivity ?1:35     1 unit drops blood glucose 35 mg/dl ?  ?Target Glucose Goals ?12A      120-150 mg/dl ?  ?Active insulin time: 5 hours ? ?Noted patient was recently inpatient at [facility] from 02/01/22-02/20/22  and per Endocrinology inpatient note by Dr. Ronnald Ramp on 02/19/22, insulin pump settings were noted to be: ?- continue U-500 Medtronic pump per patient self-management protocol  ?12a 0.6 (equivalent to 3u/h U-100)  ? 8a 1 (equivalent to 5u/hr U100 insulin) ?ICR 5 ?ISF 35 ? ?Noted patient initially hypoglycemic with glucose of 47 mg/dl at 22:01 on 02/24/22 and hypoglycemic again this morning at 3:59 with CBG of 55 mg/dl. Mateo Flow, RN reports patient does not have insulin pump on currently and patient with intermittent confusion and forgetfulness today. Anticipate that once Humulin R U500 insulin last given by insulin pump wears off that glucose will increase especially since insulin pump is not on.   ? ?Addendum 02/25/22@12 :51- Spoke with patient and her daughter at bedside regarding DM control. Patient would like to be reconnected to her insulin pump. Patient and her daughter report that glucose was low at home prior to coming to the hospital. Patient had on her Medtronic insulin pump with Humulin R U500 insulin and it was removed on day of admission. Patient states that she has not made any changes with her insulin pump because Dr. Gabriel Carina or someone from her office does that. Patient's daughter confirms that insulin pump changes were made by Endocrinology team at [facility] during her recent hospitalization. Patient's daughter reports that patient's glucose was fairly well controlled with pump adjustments made by [facility]. Patient's  daughter has patient's insulin pump in her purse. She provided insulin pump so that pump settings could be reviewed which are currently: ? ?Basal insulin ?12A 0.6 units/hr ?8A  1.0 units/hr ?Total Basal: 20.8 units/24 hours ?ICR 5g (1 unit for every 5 grams of carbs) ?ISF 35 mg/dl (1 unit drops glucose 35 mg/dl) ? ?Patient's daughter states that patient's glucose had been doing okay since discharge from [facility] but patient has been sleeping more and not eating as much over the past few days  which is why she feels hypoglycemia occurred. Patient confirms that she rarely boluses for carbs or correction using her insulin pump.  Patient is adamant about getting insulin pump reconnected. Explained that per current settings, are providing a total of 20.8 units/24 hours and if her glucose is low without any other boluses for carbs or correction then her basal rates likely need to be decreased more to prevent reoccurrence of hypoglycemia. Informed patient I would reach out to Dr. Joycie Peek office to ask about adjustments. Patient reports that she is doing finger sticks for glucose monitoring. She states she use to use FreeStyle [place] but the sensors kept coming off if she bumped into things. Discussed benefit of FreeStyle Libre2 as it would collect more information on glucose trends but it also has alarm feature so it will alarm if glucose is less than 70 mg/dl or over 240 mg/dl (factory default parameters which can be changed if preferred). Patient is interested in using [employer]; will ask attending provider if they are okay with providing patient with samples of FreeStyle Libre2 CGM sensors.  Patient again states that she would like to put her insulin pump back on. Informed patient I would reach out to Dr. Joycie Peek office and discuss with attending provider. Patient verbalized understanding of information discussed and has no questions at this time. ? ?Thanks, ?Barnie Alderman, RN, MSN, CDE ?Diabetes Coordinator ?Inpatient Diabetes Program ?[phone] (Team Pager from 8am to 5pm) ? ?

## 2022-02-25 NOTE — Assessment & Plan Note (Signed)
Mild hyperammonemia otherwise compensated ?

## 2022-02-25 NOTE — H&P (Signed)
History and Physical    Patient: Brittney [name] CHE:[id number] DOB: 04-18-1951 DOA: 02/24/2022 DOS: the patient was seen and examined on 02/25/2022 PCP: Rusty Aus, MD  Patient coming from: Home  Chief Complaint:  Chief Complaint  Patient presents with   Fatigue    HPI: Brittney [name] is a 71 y.o. female with medical history significant of T2DM on insulin pump, CKD, hypothyroidism, Karlene Lineman cirrhosis, pulmonary hypertension on CT, CAD, HFpEF, class IV obesity, prior strokes, OSA/OHS on nighttime CPAP, chronic respiratory failure on home O2 at 3 L, bedbound for the past 2 months following a right tibial shaft fracture, hospitalized at [facility] from 2/6 to 2/25 for acute metabolic encephalopathy believe multifactorial and related to polypharmacy and NASH cirrhosis, for which documentation reports multiple discussions were had with patient but with patient declining discontinuation of meds who presents to the ED for evaluation of somnolence.  Since her discharge patient has been sleeping a lot, not waking up to eat.  During her recent hospitalization she underwent partial resection of left great toe due to concern for osteomyelitis and was treated with antibiotics.  Pathology and bone cultures were unremarkable. ED course: On arrival afebrile, BP 118/58, pulse 50 with O2 sat 94% on 3 L ABG with pH 7.37, PCO2 69 and PO2 129 on home flow rate of 3 L, ammonia level 48, EtOH less than 10, urine drug screen positive for tricyclic's, opiates and benzodiazepines. Blood glucose 57 COVID and flu negative EKG: Sinus bradycardia at 51. CT head nonacute Patient was placed on BiPAP with improvement in PCO2 on VBG to 50 She was given an NS bolus and D50 amp. Hospitalist consulted for admission . Patient more awake by the time of admission  Review of Systems: unable to review all systems due to the inability of the patient to answer questions.Due to somnolence Past Medical History:  Diagnosis Date   Abdominal  pain, left upper quadrant 12/11/2012   Anemia    Arthritis    Asthma    Broken leg 2014   fell twice and broke same leg. had a bone stimulator for first break.   Cancer ([place]) 2007   melanoma, left ear and back of left leg, removed from back   Chronic kidney disease, stage 2 (mild)    Chronic lower back pain    Collagen vascular disease (HCC)    Complication of anesthesia    slow to wake, coughing   Coronary artery disease    Diabetes mellitus without complication ([place])    Diabetic nephropathy associated with secondary diabetes mellitus ([place])    Flushing 12/11/2012   Gastroparesis    Gross hematuria 12/11/2012   Hepatic cirrhosis ([place])    History of kidney stones    Hyperlipidemia    Hypertension    Hypothyroidism    Hypothyroidism    IBS (irritable bowel syndrome)    Kidney stone 12/11/2012   Lower extremity edema    Morbid obesity with BMI of 40.0-44.9, adult (HCC)    Motion sickness    back seat of cars   MRSA (methicillin resistant staph aureus) culture positive 2016   Myocardial infarction ([place]) 09/2013   Nausea without vomiting 12/11/2012   Nephrolithiasis    Nephrolithiasis 04/26/2014   Nonproliferative retinopathy due to secondary diabetes (HCC)    Numbness and tingling of right leg    Peripheral vascular disease ([place])    Renal colic 53/61/4431   Renal insufficiency    Sciatica    Sciatica 12/11/2012  Skin cancer 08/2018   Sleep apnea    USES CPAP   Spinal cord stimulator status    battery in left buttock   Stroke ([place]) 07/2020   Unilateral small kidney without contralateral hypertrophy    Wears dentures    full lower   Wheelchair dependent    reports able to transfer with assist   Past Surgical History:  Procedure Laterality Date   ABDOMINAL HYSTERECTOMY     BACK SURGERY  04/1999   L-4-5 LAMINECTOMY. metal in lower back and neck   CATARACT EXTRACTION W/PHACO Right 09/17/2021   Procedure: CATARACT EXTRACTION PHACO AND INTRAOCULAR LENS PLACEMENT  ([place]) RIGHT DIABETIC;  Surgeon: Leandrew Koyanagi, MD;  Location: [facility] ORS;  Service: Ophthalmology;  Laterality: Right;  5.64 0:58.9   [place]     COLONOSCOPY  05/04/2001   COLONOSCOPY WITH PROPOFOL N/A 09/20/2016   Procedure: COLONOSCOPY WITH PROPOFOL;  Surgeon: Lollie Sails, MD;  Location: [facility] ENDOSCOPY;  Service: Endoscopy;  Laterality: N/A;   DILATION AND CURETTAGE OF UTERUS     ESOPHAGOGASTRODUODENOSCOPY  02/08/2014   ESOPHAGOGASTRODUODENOSCOPY (EGD) WITH PROPOFOL N/A 09/20/2016   Procedure: ESOPHAGOGASTRODUODENOSCOPY (EGD) WITH PROPOFOL;  Surgeon: Lollie Sails, MD;  Location: [facility] ENDOSCOPY;  Service: Endoscopy;  Laterality: N/A;   ESOPHAGOGASTRODUODENOSCOPY (EGD) WITH PROPOFOL N/A 05/23/2018   Procedure: ESOPHAGOGASTRODUODENOSCOPY (EGD) WITH PROPOFOL;  Surgeon: Lollie Sails, MD;  Location: [facility] ENDOSCOPY;  Service: Endoscopy;  Laterality: N/A;   ESOPHAGOGASTRODUODENOSCOPY (EGD) WITH PROPOFOL N/A 07/18/2020   Procedure: ESOPHAGOGASTRODUODENOSCOPY (EGD) WITH PROPOFOL;  Surgeon: Lesly Rubenstein, MD;  Location: [facility] ENDOSCOPY;  Service: Gastroenterology;  Laterality: N/A;   EYE SURGERY  1986   FOR MELANOMA   HERNIA REPAIR     JOINT REPLACEMENT Left 09/24/2013   TOTAL KNEE   MOHS SURGERY  11/2016   left side of nose   PULSE GENERATOR IMPLANT N/A 11/02/2017   Procedure: UNILATERAL PULSE GENERATOR IMPLANT;  Surgeon: Meade Maw, MD;  Location: [facility] ORS;  Service: Neurosurgery;  Laterality: N/A;   TOE AMPUTATION Left    Partial Great   TONSILLECTOMY     WITH ADENOIDECTOMY   Social History:  reports that she has never smoked. She has never used smokeless tobacco. She reports that she does not drink alcohol and does not use drugs.  Allergies  Allergen Reactions   Shellfish Allergy Nausea And Vomiting, Diarrhea and Nausea Only    Non stop sickness once ingests shellfish. Betadine is okay   Cephalexin Other (See Comments)     Doesn't digest it well   Amlodipine Cough   Codeine Nausea And Vomiting and Nausea Only    states she can take codeine cough syrup without problems states she can take codeine cough syrup without problems   Imdur [Isosorbide Dinitrate] Other (See Comments) and Cough    Headache   Lyrica [Pregabalin] Swelling and Other (See Comments)    "FLU-LIKE" SYMPTOMS   Monosodium Glutamate Other (See Comments) and Cough    Headache   Tape Other (See Comments) and Nausea And Vomiting    can use paper tape Redness    Family History  Problem Relation Age of Onset   Heart disease Mother    Cancer Mother    Asthma Mother    Diabetes Father    Kidney disease Father    Hypertension Father    Breast cancer Neg Hx     Prior to Admission medications   Medication Sig Start Date End Date Taking?  Authorizing Provider  albuterol (VENTOLIN HFA) 108 (90 Base) MCG/ACT inhaler Inhale 1-2 puffs into the lungs every 4 (four) hours as needed. 01/28/22  Yes [provider]  ALPRAZolam Duanne Moron) 0.5 MG tablet Take 0.5 mg by mouth 3 (three) times daily.   Yes [provider]  aspirin 81 MG EC tablet Take 81 mg by mouth daily. Swallow whole.   Yes [provider]  budesonide (PULMICORT) 0.5 MG/2ML nebulizer solution Take 0.5 mg by nebulization 2 (two) times daily.   Yes [provider]  carvedilol (COREG) 25 MG tablet Take 25 mg by mouth 2 (two) times daily with a meal.   Yes [provider]  cholecalciferol (VITAMIN D3) 25 MCG (1000 UNIT) tablet Take 5,000 Units by mouth daily.   Yes [provider]  cyclobenzaprine (FLEXERIL) 10 MG tablet Take 20 mg by mouth at bedtime.   Yes [provider]  escitalopram (LEXAPRO) 10 MG tablet Take 10 mg by mouth daily.  12/13/12  Yes [provider]  fluticasone (FLONASE) 50 MCG/ACT nasal spray Place 2 sprays into both nostrils daily as needed for allergies.   Yes [provider]  furosemide  (LASIX) 40 MG tablet Take 40 mg by mouth daily as needed for fluid or edema. 08/14/20  Yes [provider]  gabapentin (NEURONTIN) 600 MG tablet Take 1,200-1,800 mg by mouth 4 (four) times daily. 1200 mg tid and 1800 mg qhs 02/22/22  Yes [provider]  guaiFENesin (MUCINEX PO) Take 600 mg by mouth 2 (two) times daily.   Yes [provider]  HUMULIN R 500 UNIT/ML injection Inject 680 Units into the skin as directed. In daily divided doses throughout day 02/12/22  Yes [provider]  hydrALAZINE (APRESOLINE) 50 MG tablet Take 50 mg by mouth 2 (two) times daily. 08/01/21  Yes [provider]  ibuprofen (ADVIL) 800 MG tablet Take 800 mg by mouth daily.   Yes [provider]  ipratropium-albuterol (DUONEB) 0.5-2.5 (3) MG/3ML SOLN Take 3 mLs by nebulization every 6 (six) hours as needed (bronchitis). 07/25/20  Yes [provider]  KLOR-CON M10 10 MEQ tablet Take 10 mEq by mouth daily. 12/13/21  Yes [provider]  levothyroxine (SYNTHROID) 200 MCG tablet Take 1 tablet (200 mcg total) by mouth daily at 6 (six) AM. 11/29/21  Yes Gonfa, Charlesetta Ivory, MD  linaclotide (LINZESS) 145 MCG CAPS capsule Take 1 capsule (145 mcg total) by mouth daily as needed (Constipation). 11/28/21  Yes Mercy Riding, MD  metolazone (ZAROXOLYN) 5 MG tablet Take 5 mg by mouth once a week. 01/24/20 02/24/22 Yes [provider]  ondansetron (ZOFRAN) 4 MG tablet Take 1 tablet (4 mg total) by mouth every 6 (six) hours as needed for nausea. 11/28/21  Yes Mercy Riding, MD  oxyCODONE-acetaminophen (PERCOCET) 10-325 MG tablet Take 1 tablet by mouth 2 (two) times daily.   Yes [provider]  pantoprazole (PROTONIX) 40 MG tablet Take 40 mg by mouth 2 (two) times daily.    Yes [provider]  pramipexole (MIRAPEX) 0.5 MG tablet Take 1 mg by mouth at bedtime. 02/20/22  Yes [provider]  telmisartan (MICARDIS) 40 MG tablet Take 40 mg by mouth daily.  08/14/20  Yes [provider]  traMADol (ULTRAM) 50 MG tablet Take 100 mg by mouth at bedtime.   Yes [provider]  traZODone (DESYREL) 150 MG tablet Take 150 mg by mouth at bedtime.  12/11/12  Yes [provider]  vitamin C (ASCORBIC ACID) 500 MG tablet Take 500 mg by mouth daily.   Yes [provider]  acetaminophen (TYLENOL) 500 MG tablet Take 2 tablets (1,000 mg total) by mouth every 6 (six) hours as needed for mild pain (or Fever >/= 101). 11/28/21   Mercy Riding, MD  acetaminophen (TYLENOL) 650 MG suppository Place 1 suppository (650 mg total) rectally every 6 (six) hours as needed for mild pain (or Fever >/= 101). 11/28/21   Mercy Riding, MD  BOUDREAUXS BUTT PASTE 40 % ointment SMARTSIG:1 Topical 4 Times Daily 12/28/21   [provider]  pravastatin (PRAVACHOL) 10 MG tablet Take 10 mg by mouth at bedtime. 01/23/22   [provider]    Physical Exam: Vitals:   02/25/22 0000 02/25/22 0030 02/25/22 0100 02/25/22 0205  BP: (!) 122/92 129/62 139/66 (!) 115/52  Pulse: 74 (!) 51 (!) 55 (!) 49  Resp: 14 12 16 12   Temp: 98.2 F (36.8 C)     TempSrc: Oral     SpO2: 94% 99% 100% 99%  Weight:      Height:       Physical Exam Vitals and nursing note reviewed.  Constitutional:      General: She is not in acute distress.    Appearance: Normal appearance. She is obese.  HENT:     Head: Normocephalic and atraumatic.  Cardiovascular:     Rate and Rhythm: Normal rate and regular rhythm.     Pulses: Normal pulses.     Heart sounds: Normal heart sounds. No murmur heard. Pulmonary:     Effort: Pulmonary effort is normal.     Breath sounds: Normal breath sounds. No wheezing or rhonchi.  Abdominal:     General: Bowel sounds are normal.     Palpations: Abdomen is soft.     Tenderness: There is no abdominal tenderness.  Musculoskeletal:        General: No swelling or tenderness. Normal range of motion.     Cervical back: Normal range of  motion and neck supple.  Skin:    General: Skin is warm and dry.  Neurological:     General: No focal deficit present.     Mental Status: She is alert. Mental status is at baseline.  Psychiatric:        Mood and Affect: Mood normal.        Behavior: Behavior normal.     Data Reviewed: Relevant notes from primary care and specialist visits, past discharge summaries as available in EHR, including Care Everywhere. Prior diagnostic testing as pertinent to current admission diagnoses Updated medications and problem lists for reconciliation ED course, including vitals, labs, imaging, treatment and response to treatment Triage notes, nursing and pharmacy notes and ED provider's notes Notable results as noted in HPI   Assessment and Plan: * Acute metabolic encephalopathy- (present on admission) Multifactorial related to polypharmacy, based on UDS, NASH cirrhosis We will keep n.p.o. until more awake Hold all sedating meds Neurologic checks with fall and aspiration precautions  Acute on chronic respiratory failure with hypercapnia (HCC) Likely secondary to sedating meds superimposed on OSA/OHS and chronic respiratory failure Hold sedating meds Continue BiPAP and titrate to alertness and wean as tolerated to home O2  Hyperammonemia (HCC) Mild elevation in ammonia to 48, contributing to altered mental status  Uncontrolled type 2 diabetes mellitus with hypoglycemia, with long-term current use of insulin ([place]) Secondary to poor oral intake from protracted somnolence Received D50 in the ED Hydration  with D5 and monitor Patient is on an insulin pump which we will hold for now  Wheelchair dependent Patient mostly bedbound  Coronary artery disease No complaints of chest pain Continue aspirin, carvedilol, pravastatin and telmisartan  Polypharmacy UDS positive for benzos, tricyclics and opioids Review of discharge summary from [facility] reveals that patient declined reducing her pain meds  during recent hospitalization We will continue to encourage patient  Morbid obesity with BMI of 50.0-59.9, adult ([place]) Complicating factor to overall prognosis and care especially in view of nonambulant status  Chronic pain syndrome- (present on admission) Pain control with judicious opioid use  Essential hypertension- (present on admission) Controlled Continue home carvedilol, hydralazine, furosemide  OSA (obstructive sleep apnea)- (present on admission) CPAP nightly  Liver cirrhosis secondary to NASH ([place]) Mild hyperammonemia otherwise compensated       Advance Care Planning:   Code Status: Prior   Consults: none  Family Communication: none  Severity of Illness: The appropriate patient status for this patient is INPATIENT. Inpatient status is judged to be reasonable and necessary in order to provide the required intensity of service to ensure the patient's safety. The patient's presenting symptoms, physical exam findings, and initial radiographic and laboratory data in the context of their chronic comorbidities is felt to place them at high risk for further clinical deterioration. Furthermore, it is not anticipated that the patient will be medically stable for discharge from the hospital within 2 midnights of admission.   * I certify that at the point of admission it is my clinical judgment that the patient will require inpatient hospital care spanning beyond 2 midnights from the point of admission due to high intensity of service, high risk for further deterioration and high frequency of surveillance required.*  Author: Athena Masse, MD 02/25/2022 2:25 AM  For on call review [email or web address].

## 2022-02-25 NOTE — Assessment & Plan Note (Signed)
Patient mostly bedbound ?

## 2022-02-25 NOTE — Progress Notes (Signed)
Pt's skin assessed; noted with several small open areas to her sacrum most likely from incontinence/excoriations.  ?L great toe clean dry and sutures intact; L lower leg noted with open areas; clean and dry, covered with mepitel one, gauze and elastic bandage. ?R tib fib site remains open; noted with moderate drainange (serous sanguinous); mepitel one dressing one, gauze covered with kerlix.  ?

## 2022-02-25 NOTE — Progress Notes (Signed)
Inpatient Diabetes Program Recommendations ? ?AACE/ADA: New Consensus Statement on Inpatient Glycemic Control  ? ?Target Ranges:  Prepandial:   less than 140 mg/dL ?     Peak postprandial:   less than 180 mg/dL (1-2 hours) ?     Critically ill patients:  140 - 180 mg/dL  ? ? Latest Reference Range & Units 02/25/22 03:02 02/25/22 03:59 02/25/22 04:33 02/25/22 04:40 02/25/22 04:57 02/25/22 06:09 02/25/22 07:31 02/25/22 11:02  ?Glucose-Capillary 70 - 99 mg/dL 71 55 (L) 64 (L) 60 (L) 81 142 (H) 184 (H) 221 (H)  ? ? Latest Reference Range & Units 02/24/22 22:01 02/24/22 22:53 02/24/22 23:50  ?Glucose-Capillary 70 - 99 mg/dL 47 (L) 105 (H) 88  ? ?Review of Glycemic Control ? ?Diabetes history: DM2 ?Outpatient Diabetes medications: Medtronic 630G insulin pump with Humulin R U500 ?Current orders for Inpatient glycemic control: Novolog 0-20 units Q4H ?  ?Inpatient Diabetes Program Recommendations:   ?  ?Insulin: Once glucose is consistently over 180 mg/dl without any further hypoglycemia, will likely need to order basal insulin if insulin pump is not resumed (would recommend using NPH or Levemir one time order to prevent overlap of basal insulin duration if insulin pump is resumed within the next 24 hours. ?  ?Insulin Pump: If attending provider or inpatient diabetes team does not hear from Dr. Joycie Peek office prior to discharge, attending provider may want to consider decreasing basal rate to 0.6 units for 24 hours (would provide total of 14.4 units of Humulin R U500 insulin over 24 hour period). ? ?Outpatient: Please provide Rx for FreeStyle Libre2 CGM sensors (#[id number]) at time of discharge. ? ?NOTE: Communicated with Dr. Mal Misty and given permission to provide patient with FreeStyle Libre2 CBG reader and sample sensors. Called Dr. Joycie Peek office at 12:39 today and was informed that Dr. Gabriel Carina is off today and left message for her nurse Aniceto Boss regarding request for insulin pump setting adjustments due to hypoglycemia. Current  insulin pump settings are: ?Basal insulin ?12A 0.6 units/hr ?8A  1.0 units/hr ?Total Basal: 20.8 units/24 hours ?ICR 5g (1 unit for every 5 grams of carbs) ?ISF 35 mg/dl (1 unit drops glucose 35 mg/dl) ?If attending provider or inpatient diabetes team does not hear from Dr. Joycie Peek office prior to discharge, attending provider may want to consider decreasing basal rate to 0.6 units for 24 hours (would provide total of 14.4 units of Humulin R U500 insulin over 24 hour period); communicated with Dr. Mal Misty.  ?Education done with patient's daughter at bedside regarding application and changing CGM sensor (alternate every 14 days on back of arms), 1 hour warm-up, how to scan CGM for glucose reading. Patient has used [employer] 14 day so she is aware of how to apply and use FreeStyle Libre2.   Patient has been given [employer] reader and first 2 sensors for use. Patient has also been given educational packet regarding use CGM sensor. Assisted patient's daughter to apply FreeStyle Libre2 sensor to back of left upper arm at 2:20 today. Reminded patient and her daughter that glucose readings will not be available until 1 hour after application. Patient and her daughter verbalized understanding of how to apply and use FreeStyle Libre2 CGM. Informed patient and her daughter that Dr. Gabriel Carina is off today and I have left message for her nurse to return a call regarding insulin pump setting adjustments due to recurrent hypoglycemia. Explained that attending provider would like to continue to follow glucose trends and use SQ insulin for now. Patient  and her daughter verbalized understanding and have no questions at this time. ? ?Thanks, ?Barnie Alderman, RN, MSN, CDE ?Diabetes Coordinator ?Inpatient Diabetes Program ?[phone] (Team Pager from 8am to 5pm) ? ? ?

## 2022-02-25 NOTE — Assessment & Plan Note (Deleted)
Secondary to poor oral intake from protracted somnolence ?Received D50 in the ED ?Hydration with D5 and monitor ?

## 2022-02-25 NOTE — Assessment & Plan Note (Signed)
Mild elevation in ammonia to 48, contributing to altered mental status ?

## 2022-02-25 NOTE — Progress Notes (Signed)
[facility]) Hospital Liaison Note ?  ?Received request from [facility] PMT & confirmation from MD stating that family would like to transition to Hospice. MSW contacted daughter/Laura to confirm interest in services and did not receive an answer. MSW left message requesting call be returned. MSW will continue to attempt contact.  ?  ?Please call with any questions/concerns.  ?  ?Thank you for the opportunity to participate in this patient's care. ?  ?Daphene Calamity, MSW ?[place]  ?[phone] ?

## 2022-02-25 NOTE — Telephone Encounter (Signed)
Palliative NP spoke with patient's daughter on 02/24/2022. She states patient is not wanting to return to hospital. She is needing much more support in the home. No HH agency is currently seeing patient and they are needing to find replacement caregivers. Briefly discussed patient transitioning to hospice, comfort. Daughter to speak with patient and to think about it further. Patient to be seen on Friday for f/u visit.  ?

## 2022-02-25 NOTE — ED Notes (Addendum)
Family member requesting that pt's blood glucose be rechecked as she thinks she is becoming lethargic again. Patient repositioned in bed with assistance of family member. Linens changed, RT at bedside to place pt on bipap. Pt and family member informed of need for in and out cath for urine sample, pt and family member in agreement.  ? ?

## 2022-02-25 NOTE — Plan of Care (Signed)

## 2022-02-25 NOTE — Assessment & Plan Note (Signed)
UDS positive for benzos, tricyclics and opioids ?Review of discharge summary from [facility] reveals that patient declined reducing her pain meds during recent hospitalization ?We will continue to encourage patient ?

## 2022-02-25 NOTE — Assessment & Plan Note (Signed)
Pain control with judicious opioid use ?

## 2022-02-25 NOTE — Assessment & Plan Note (Addendum)
Secondary to poor oral intake from protracted somnolence ?Received D50 in the ED ?Hydration with D5 and monitor ?Patient is on an insulin pump which we will hold for now ?

## 2022-02-25 NOTE — Assessment & Plan Note (Signed)
Multifactorial related to polypharmacy, based on UDS, NASH cirrhosis ?We will keep n.p.o. until more awake ?Hold all sedating meds ?Neurologic checks with fall and aspiration precautions ?

## 2022-02-26 LAB — GLUCOSE, CAPILLARY
Glucose-Capillary: 141 mg/dL — ABNORMAL HIGH (ref 70–99)
Glucose-Capillary: 141 mg/dL — ABNORMAL HIGH (ref 70–99)
Glucose-Capillary: 246 mg/dL — ABNORMAL HIGH (ref 70–99)

## 2022-02-26 MED ORDER — HUMULIN R U-500 (CONCENTRATED) 500 UNIT/ML ~~LOC~~ SOLN: SUBCUTANEOUS | 0 refills | Status: AC

## 2022-02-26 NOTE — Progress Notes (Signed)
Patient noted to have intermittent confusion- will forget where she is or why she is in the hospital, but a few minutes later, patient will be totally aox4. Per daughter, this has been going on prior to admission. ?Daughter had also notified this RN that patient has h/o going in "pain clinic" to manage oxycontin use, but this is now managed by a physician in [place] clinic.  ? ? ?

## 2022-02-26 NOTE — TOC Transition Note (Addendum)
Transition of Care (TOC) - CM/SW Discharge Note ? ? ?Patient Details  ?Name: Brittney [name] ?MRN: [id number] ?Date of Birth: Jan 31, 1951 ? ?Transition of Care (TOC) CM/SW Contact:  ?Alberteen Sam, LCSW ?Phone Number: ?02/26/2022, 10:33 AM ? ? ?Clinical Narrative:    ? ?Patient will DC [id number] with [facility] ?Anticipated DC date: 02/26/22 ?Family notified: daughter [name] ?Transport by: [facility] scheduled for 4:00 pm ? ?Per MD patient ready for DC home, is set up with [facility] hospice. Per hospice, hospital bed to be delivered Ambulance transport requested for patient for 4:00 pm.to home between 2-4pm.  ? ?Per Lorayne Bender with [facility] patient will remain full code.  ? ?CSW signing off. ? ?Pricilla Riffle, LCSW ? ? ? ?Final next level of care: [place] ?Barriers to Discharge: No Barriers Identified ? ? ?Patient Goals and CMS Choice ?Patient states their goals for this hospitalization and ongoing recovery are:: to go home ?CMS Medicare.gov Compare Post Acute Care list provided to:: Patient Represenative (must comment) (daughter) ?Choice offered to / list presented to : Adult Children ? ?Discharge Placement ?  ?           ?  ?Patient to be transferred to facility by: [facility] ?Name of family member notified: daughter ?Patient and family notified of of transfer: 02/26/22 ? ?Discharge Plan and Services ?  ?  ?           ?  ?  ?  ?  ?  ?  ?  ?  ?  ?  ? ?Social Determinants of Health (SDOH) Interventions ?  ? ? ?Readmission Risk Interventions ?Readmission Risk Prevention Plan 07/10/2021  ?Transportation Screening Complete  ?PCP or Specialist Appt within 3-5 Days Complete  ?[place] or Home Care Consult Complete  ?Social Work Consult for [place] Planning/Counseling Complete  ?Palliative Care Screening Not Applicable  ?Medication Review Press photographer) Complete  ?Some recent data might be hidden  ? ? ? ? ? ?

## 2022-02-26 NOTE — Discharge Summary (Signed)
Physician Discharge Summary  Brittney [name] JQB:[id number] DOB: October 01, 1951 DOA: 02/24/2022  PCP: Rusty Aus, MD  Admit date: 02/24/2022 Discharge date: 02/26/2022  Discharge disposition: Home with hospice   Recommendations for Outpatient Follow-Up:   Follow-up with hospice team at home   Discharge Diagnosis:   Principal Problem:   Acute metabolic encephalopathy Active Problems:   Chronic pain syndrome   Chronic lower back pain   Liver cirrhosis secondary to NASH (HCC)   OSA (obstructive sleep apnea)   Essential hypertension   Morbid obesity with BMI of 50.0-59.9, adult ([place])   Uncontrolled type 2 diabetes mellitus with hypoglycemia, with long-term current use of insulin (HCC)   Polypharmacy   Acute on chronic respiratory failure with hypercapnia (HCC)   Hyperammonemia ([place])   Coronary artery disease   Wheelchair dependent    Discharge Condition: Stable.  Diet recommendation:  Diet Order             Diet - low sodium heart healthy           Diet Carb Modified           Diet heart healthy/carb modified Room service appropriate? 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  At that time, patient had been advised to make changes in her medications but she declined.  She presented to the hospital because of fatigue,  generalized weakness and altered mental status. Her blood glucose dropped to 61 at home.  She was admitted to the hospital for acute metabolic encephalopathy, hypoglycemia and acute on chronic hypercapnic respiratory failure.  She was treated with BiPAP.  Insulin pump was held.  She had recurrent hypoglycemia in the hospital and she was treated with IV dextrose infusion.  Ammonia level was slightly elevated but the significance of this is unclear.  Patient is under palliative care at home.  She and her daughter requested hospice services.  Hospice was consulted and patient is eligible for hospice services at home.  She is deemed stable for discharge to home today.  Bariatric hospital bed was prescribed at discharge.       Discharge Exam:    Vitals:   02/25/22 2330 02/25/22 2350 02/26/22 0403 02/26/22 0742  BP:  (!) 170/62 (!) 129/47 (!) 145/48  Pulse:  63 (!) 58 62  Resp: 15 20 16 19   Temp:  98.6 F (37 C) 98.2 F (36.8 C)   TempSrc:   Oral   SpO2: 95% 91% 98% 97%  Weight:      Height:         GEN: NAD SKIN: Warm and dry.  Chronic nonhealing wounds on bilateral legs EYES: No pallor or icterus ENT: MMM CV: RRR PULM: CTA B ABD: soft, obese, NT, +BS CNS: AAO x 3, non focal EXT:  No edema or tenderness   The results of significant diagnostics from this hospitalization (including imaging, microbiology, ancillary and laboratory) are listed below for reference.     Procedures and Diagnostic Studies:   CT HEAD WO CONTRAST (5MM)  Result Date: 02/24/2022 CLINICAL DATA:  Altered mental status EXAM: CT HEAD WITHOUT CONTRAST TECHNIQUE: Contiguous axial images were obtained from the base of the skull through the vertex without intravenous contrast. RADIATION DOSE REDUCTION: This exam was performed according to the departmental dose-optimization program which includes automated exposure control, adjustment of the mA and/or kV according to patient size and/or use of iterative  reconstruction technique. COMPARISON:  07/07/2021 FINDINGS: Brain: Enlargement of the left sylvian fissure is unchanged., possibly reflecting the presence of an underlying arachnoid cyst. Remote left cerebellar infarct again noted, unchanged. No acute intracranial hemorrhage or infarct. No abnormal mass effect or midline shift. No abnormal intra or extra-axial mass lesion. Ventricular size is normal. Cerebellum is otherwise unremarkable. Vascular: No hyperdense vessel or unexpected calcification. Skull: Normal. Negative for fracture or focal lesion. Sinuses/Orbits: No acute finding. Other: Mastoid air cells and middle ear cavities are clear. IMPRESSION: No acute intracranial abnormality. Electronically Signed   By: Fidela Salisbury M.D.   On: 02/24/2022 23:26     Labs:   Basic Metabolic Panel: Recent Labs  Lab 02/24/22 2103  NA 142  K 4.2  CL 100  CO2 34*  GLUCOSE 57*  BUN 10  CREATININE 1.02*  CALCIUM 9.9   GFR Estimated Creatinine Clearance: 80.3 mL/min (A) (by C-G formula based on SCr of 1.02 mg/dL (H)). Liver Function Tests: Recent Labs  Lab 02/24/22 2211  AST 22  ALT 9  ALKPHOS 72  BILITOT 0.6  PROT 6.3*  ALBUMIN 2.8*   No results for input(s): LIPASE, AMYLASE in the last 168 hours. Recent Labs  Lab 02/24/22 2258  AMMONIA 48*   Coagulation profile No results for input(s): INR, PROTIME in the last 168 hours.  CBC: Recent Labs  Lab 02/24/22 2103  WBC 7.1  HGB 10.6*  HCT 37.2  MCV 91.6  PLT 272   Cardiac Enzymes: No results for input(s): CKTOTAL, CKMB, CKMBINDEX, TROPONINI in the last 168 hours. BNP: Invalid input(s): POCBNP CBG: Recent Labs  Lab 02/25/22 1610 02/25/22 2010 02/25/22 2351 02/26/22 0359 02/26/22 0742  GLUCAP 208* 208* 170* 141* 141*   D-Dimer No results for input(s): DDIMER in the last 72 hours. Hgb A1c No results for input(s): HGBA1C in the last 72 hours. Lipid Profile No results for input(s): CHOL, HDL, LDLCALC, TRIG, CHOLHDL,  LDLDIRECT in the last 72 hours. Thyroid function studies No results for input(s): TSH, T4TOTAL, T3FREE, THYROIDAB in the last 72 hours.  Invalid input(s): FREET3 Anemia work up No results for input(s): VITAMINB12, FOLATE, FERRITIN, TIBC, IRON, RETICCTPCT in the last 72 hours. Microbiology Recent Results (from the past 240 hour(s))  Resp Panel by RT-PCR (Flu A&B, Covid) Nasopharyngeal Swab     Status: None   Collection Time: 02/24/22 10:11 PM   Specimen: Nasopharyngeal Swab; Nasopharyngeal(NP) swabs in vial transport medium  Result Value Ref Range Status   SARS Coronavirus 2 by RT PCR NEGATIVE NEGATIVE Final    Comment: (NOTE) SARS-CoV-2 target nucleic acids are NOT DETECTED.  The SARS-CoV-2 RNA is generally detectable in upper respiratory specimens during the acute phase of infection. The lowest concentration of SARS-CoV-2 viral copies this assay can detect is 138 copies/mL. A negative result does not preclude SARS-Cov-2 infection and should not be used as the  sole basis for treatment or other patient management decisions. A negative result may occur with  improper specimen collection/handling, submission of specimen other than nasopharyngeal swab, presence of viral mutation(s) within the areas targeted by this assay, and inadequate number of viral copies(<138 copies/mL). A negative result must be combined with clinical observations, patient history, and epidemiological information. The expected result is Negative.  Fact Sheet for Patients:  EntrepreneurPulse.com.au  Fact Sheet for Healthcare Providers:  IncredibleEmployment.be  This test is no t yet approved or cleared by the [place] FDA and  has been authorized for detection and/or diagnosis of SARS-CoV-2 by FDA under an Emergency Use Authorization (EUA). This EUA will remain  in effect (meaning this test can be used) for the duration of the COVID-19 declaration under Section  564(b)(1) of the Act, 21 U.S.C.section 360bbb-3(b)(1), unless the authorization is terminated  or revoked sooner.       Influenza A by PCR NEGATIVE NEGATIVE Final   Influenza B by PCR NEGATIVE NEGATIVE Final    Comment: (NOTE) The Xpert Xpress SARS-CoV-2/FLU/RSV plus assay is intended as an aid in the diagnosis of influenza from Nasopharyngeal swab specimens and should not be used as a sole basis for treatment. Nasal washings and aspirates are unacceptable for Xpert Xpress SARS-CoV-2/FLU/RSV testing.  Fact Sheet for Patients: EntrepreneurPulse.com.au  Fact Sheet for Healthcare Providers: IncredibleEmployment.be  This test is not yet approved or cleared by the [place] FDA and has been authorized for detection and/or diagnosis of SARS-CoV-2 by FDA under an Emergency Use Authorization (EUA). This EUA will remain in effect (meaning this test can be used) for the duration of the COVID-19 declaration under Section 564(b)(1) of the Act, 21 U.S.C. section 360bbb-3(b)(1), unless the authorization is terminated or revoked.  Performed at [facility], [address] [place], [place] [address]   MRSA Next Gen by PCR, Nasal     Status: Abnormal   Collection Time: 02/25/22  1:23 PM   Specimen: Nasal Mucosa; Nasal Swab  Result Value Ref Range Status   MRSA by PCR Next Gen DETECTED (A) NOT DETECTED Final    Comment: RESULT CALLED TO, READ BACK BY AND VERIFIED WITH: VALERIE COULTER 02/25/22 1512 KLW (NOTE) The GeneXpert MRSA Assay (FDA approved for NASAL specimens only), is one component of a comprehensive MRSA colonization surveillance program. It is not intended to diagnose MRSA infection nor to guide or monitor treatment for MRSA infections. Test performance is not FDA approved in patients less than 78 years old. Performed at [facility], [address], [place], [place] [address]      Discharge Instructions:    Discharge Instructions     Diet - low sodium heart healthy   Complete by: As directed    Diet Carb Modified   Complete by: As directed    Discharge instructions   Complete by: As directed    Follow up with hospice team at home   Discharge wound care:   Complete by: As directed    Wound care to left LE:  Cleanse with NS, pat dry. Cover lesions with silicone wound contact layer (Mepitel, Lawson # [id number]), top with ABD pad and secure with Kerlix wrapped from just below toes to just below knee topped with ACE bandage wrapped in a similar manner. Change daily.  Wound care to left great toe:  Cleanse with NS, pat dry., Paint with betadine (povidone iodine) swabstick and allow to air-dry. When dry, cover with dry gauze 2x2 and secure with  conform bandaging/paper tape  Wound care to RLE partial thickness wound (Chronic): Cleanse with NS, cover with silicone wound contact layer (Mepitel, Lawson # [phone]), top with ABD pad and secure with Kerlix roll gauze/paper tape.   Increase activity slowly   Complete by: As directed       Allergies as of 02/26/2022       Reactions   Shellfish Allergy Nausea And Vomiting, Diarrhea, Nausea Only   Non stop sickness once ingests shellfish. Betadine is okay   Cephalexin Other (See Comments)   Doesn't digest it well   Amlodipine Cough   Codeine Nausea And Vomiting, Nausea Only   states she can take codeine cough syrup without problems states she can take codeine cough syrup without problems   Imdur [isosorbide Dinitrate] Other (See Comments), Cough   Headache   Lyrica [pregabalin] Swelling, Other (See Comments)   "FLU-LIKE" SYMPTOMS   Monosodium Glutamate Other (See Comments), Cough   Headache   Tape Other (See Comments), Nausea And Vomiting   can use paper tape Redness        Medication List     STOP taking these medications    Boudreauxs Butt Paste 40 % ointment Generic drug: liver oil-zinc oxide   hydrALAZINE 50 MG tablet Commonly known  as: APRESOLINE   ibuprofen 800 MG tablet Commonly known as: ADVIL   MUCINEX PO   traMADol 50 MG tablet Commonly known as: ULTRAM       TAKE these medications    acetaminophen 500 MG tablet Commonly known as: TYLENOL Take 2 tablets (1,000 mg total) by mouth every 6 (six) hours as needed for mild pain (or Fever >/= 101).   acetaminophen 650 MG suppository Commonly known as: TYLENOL Place 1 suppository (650 mg total) rectally every 6 (six) hours as needed for mild pain (or Fever >/= 101).   albuterol 108 (90 Base) MCG/ACT inhaler Commonly known as: VENTOLIN HFA Inhale 1-2 puffs into the lungs every 4 (four) hours as needed.   aspirin 81 MG EC tablet Take 81 mg by mouth daily. Swallow whole.   budesonide 0.5 MG/2ML nebulizer solution Commonly known as: PULMICORT Take 0.5 mg by nebulization 2 (two) times daily.   carvedilol 25 MG tablet Commonly known as: COREG Take 25 mg by mouth 2 (two) times daily with a meal.   cholecalciferol 25 MCG (1000 UNIT) tablet Commonly known as: VITAMIN D3 Take 5,000 Units by mouth daily.   cyclobenzaprine 10 MG tablet Commonly known as: FLEXERIL Take 20 mg by mouth at bedtime.   escitalopram 10 MG tablet Commonly known as: LEXAPRO Take 10 mg by mouth daily.   fluticasone 50 MCG/ACT nasal spray Commonly known as: FLONASE Place 2 sprays into both nostrils daily as needed for allergies.   furosemide 40 MG tablet Commonly known as: LASIX Take 40 mg by mouth daily as needed for fluid or edema.   gabapentin 600 MG tablet Commonly known as: NEURONTIN Take 1,200-1,800 mg by mouth 4 (four) times daily. 1200 mg tid and 1800 mg qhs   HUMULIN R 500 UNIT/ML injection Generic drug: insulin regular human CONCENTRATED Uses insulin pump What changed:  how much to take how to take this when to take this additional instructions   ipratropium-albuterol 0.5-2.5 (3) MG/3ML Soln Commonly known as: DUONEB Take 3 mLs by nebulization every 6  (six) hours as needed (bronchitis).   Klor-Con M10 10 MEQ tablet Generic drug: potassium chloride Take 10 mEq by mouth daily.   levothyroxine 200 MCG  tablet Commonly known as: SYNTHROID Take 1 tablet (200 mcg total) by mouth daily at 6 (six) AM.   linaclotide 145 MCG Caps capsule Commonly known as: LINZESS Take 1 capsule (145 mcg total) by mouth daily as needed (Constipation).   metolazone 5 MG tablet Commonly known as: ZAROXOLYN Take 5 mg by mouth once a week.   ondansetron 4 MG tablet Commonly known as: ZOFRAN Take 1 tablet (4 mg total) by mouth every 6 (six) hours as needed for nausea.   oxyCODONE-acetaminophen 10-325 MG tablet Commonly known as: PERCOCET Take 1 tablet by mouth 2 (two) times daily.   pantoprazole 40 MG tablet Commonly known as: PROTONIX Take 40 mg by mouth 2 (two) times daily.   pramipexole 0.5 MG tablet Commonly known as: MIRAPEX Take 1 mg by mouth at bedtime.   pravastatin 10 MG tablet Commonly known as: PRAVACHOL Take 10 mg by mouth at bedtime.   telmisartan 40 MG tablet Commonly known as: MICARDIS Take 40 mg by mouth daily.   traZODone 150 MG tablet Commonly known as: DESYREL Take 150 mg by mouth at bedtime.   vitamin C 500 MG tablet Commonly known as: ASCORBIC ACID Take 500 mg by mouth daily.   Xanax 0.5 MG tablet Generic drug: ALPRAZolam Take 0.5 mg by mouth 3 (three) times daily.               Discharge Care Instructions  (From admission, onward)           Start     Ordered   02/26/22 0000  Discharge wound care:       Comments: Wound care to left LE:  Cleanse with NS, pat dry. Cover lesions with silicone wound contact layer (Mepitel, Lawson # [id number]), top with ABD pad and secure with Kerlix wrapped from just below toes to just below knee topped with ACE bandage wrapped in a similar manner. Change daily.  Wound care to left great toe:  Cleanse with NS, pat dry., Paint with betadine (povidone iodine) swabstick and allow  to air-dry. When dry, cover with dry gauze 2x2 and secure with conform bandaging/paper tape  Wound care to RLE partial thickness wound (Chronic): Cleanse with NS, cover with silicone wound contact layer (Mepitel, Lawson # [phone]), top with ABD pad and secure with Kerlix roll gauze/paper tape.   02/26/22 1024               If you experience worsening of your admission symptoms, develop shortness of breath, life threatening emergency, suicidal or homicidal thoughts you must seek medical attention immediately by calling 911 or calling your MD immediately  if symptoms less severe.   You must read complete instructions/literature along with all the possible adverse reactions/side effects for all the medicines you take and that have been prescribed to you. Take any new medicines after you have completely understood and accept all the possible adverse reactions/side effects.    Please note   You were cared for by a hospitalist during your hospital stay. If you have any questions about your discharge medications or the care you received while you were in the hospital after you are discharged, you can call the unit and asked to speak with the hospitalist on call if the hospitalist that took care of you is not available. Once you are discharged, your primary care physician will handle any further medical issues. Please note that NO REFILLS for any discharge medications will be authorized once you are discharged, as it is imperative  that you return to your primary care physician (or establish a relationship with a primary care physician if you do not have one) for your aftercare needs so that they can reassess your need for medications and monitor your lab values.       Time coordinating discharge: Greater than 30 minutes  Signed:  Aveline Daus  [facility] 02/26/2022, 10:25 AM   Pager on [email or web address]. If 7PM-7AM, please contact night-coverage at [email or web address]

## 2022-02-26 NOTE — Progress Notes (Signed)
[facility]) Hospital Liaison Note ? ?Patient is a FULL code and will not need a signed DNR upon discharge. Please provide prescriptions at discharge as needed to ensure ongoing symptom management.  ? ?TOC/[facility] arranged transport for patient and [facility] staff aware of patient planned DC today ?  ?[facility] information and contact numbers given to family & above information shared with TOC. ?  ?Please call with any questions/concerns.  ?  ?Thank you for the opportunity to participate in this patient's care. ?  ?Daphene Calamity, MSW ?[place]  ?[phone] ?

## 2022-05-27 DEATH — deceased
